# Patient Record
Sex: Female | Born: 1935 | Race: Black or African American | Hispanic: No | Smoking: Never smoker
Health system: Southern US, Community
[De-identification: ages and names within clinical notes are randomized; demographics above are authoritative.]

## PROBLEM LIST (undated history)

## (undated) DIAGNOSIS — IMO0001 Reserved for inherently not codable concepts without codable children: Secondary | ICD-10-CM

## (undated) DIAGNOSIS — H9190 Unspecified hearing loss, unspecified ear: Secondary | ICD-10-CM

## (undated) DIAGNOSIS — M1990 Unspecified osteoarthritis, unspecified site: Secondary | ICD-10-CM

## (undated) DIAGNOSIS — H269 Unspecified cataract: Secondary | ICD-10-CM

## (undated) DIAGNOSIS — N189 Chronic kidney disease, unspecified: Secondary | ICD-10-CM

## (undated) DIAGNOSIS — F419 Anxiety disorder, unspecified: Secondary | ICD-10-CM

## (undated) DIAGNOSIS — E079 Disorder of thyroid, unspecified: Secondary | ICD-10-CM

## (undated) DIAGNOSIS — D631 Anemia in chronic kidney disease: Secondary | ICD-10-CM

## (undated) DIAGNOSIS — I10 Essential (primary) hypertension: Secondary | ICD-10-CM

## (undated) DIAGNOSIS — F32A Depression, unspecified: Secondary | ICD-10-CM

## (undated) DIAGNOSIS — F329 Major depressive disorder, single episode, unspecified: Secondary | ICD-10-CM

## (undated) DIAGNOSIS — R0602 Shortness of breath: Secondary | ICD-10-CM

## (undated) DIAGNOSIS — I471 Supraventricular tachycardia, unspecified: Secondary | ICD-10-CM

## (undated) DIAGNOSIS — J189 Pneumonia, unspecified organism: Secondary | ICD-10-CM

## (undated) DIAGNOSIS — C801 Malignant (primary) neoplasm, unspecified: Secondary | ICD-10-CM

## (undated) DIAGNOSIS — K219 Gastro-esophageal reflux disease without esophagitis: Secondary | ICD-10-CM

## (undated) DIAGNOSIS — R55 Syncope and collapse: Secondary | ICD-10-CM

## (undated) DIAGNOSIS — R0600 Dyspnea, unspecified: Secondary | ICD-10-CM

## (undated) DIAGNOSIS — D649 Anemia, unspecified: Secondary | ICD-10-CM

## (undated) DIAGNOSIS — I509 Heart failure, unspecified: Secondary | ICD-10-CM

## (undated) DIAGNOSIS — I639 Cerebral infarction, unspecified: Secondary | ICD-10-CM

## (undated) DIAGNOSIS — K589 Irritable bowel syndrome without diarrhea: Secondary | ICD-10-CM

## (undated) DIAGNOSIS — E119 Type 2 diabetes mellitus without complications: Secondary | ICD-10-CM

## (undated) DIAGNOSIS — E039 Hypothyroidism, unspecified: Secondary | ICD-10-CM

## (undated) DIAGNOSIS — N3281 Overactive bladder: Secondary | ICD-10-CM

## (undated) DIAGNOSIS — E785 Hyperlipidemia, unspecified: Secondary | ICD-10-CM

## (undated) DIAGNOSIS — N186 End stage renal disease: Secondary | ICD-10-CM

## (undated) DIAGNOSIS — K9161 Intraoperative hemorrhage and hematoma of a digestive system organ or structure complicating a digestive sytem procedure: Secondary | ICD-10-CM

## (undated) HISTORY — DX: Gastro-esophageal reflux disease without esophagitis: K21.9

## (undated) HISTORY — DX: Anemia in chronic kidney disease: D63.1

## (undated) HISTORY — DX: Cerebral infarction, unspecified: I63.9

## (undated) HISTORY — DX: Type 2 diabetes mellitus without complications: E11.9

## (undated) HISTORY — DX: Depression, unspecified: F32.A

## (undated) HISTORY — DX: Major depressive disorder, single episode, unspecified: F32.9

## (undated) HISTORY — DX: Disorder of thyroid, unspecified: E07.9

## (undated) HISTORY — DX: Overactive bladder: N32.81

## (undated) HISTORY — DX: Shortness of breath: R06.02

## (undated) HISTORY — DX: Unspecified cataract: H26.9

## (undated) HISTORY — DX: Supraventricular tachycardia: I47.1

## (undated) HISTORY — DX: Supraventricular tachycardia, unspecified: I47.10

## (undated) HISTORY — DX: Hyperlipidemia, unspecified: E78.5

## (undated) HISTORY — DX: Anxiety disorder, unspecified: F41.9

## (undated) HISTORY — DX: Chronic kidney disease, unspecified: N18.9

## (undated) HISTORY — DX: Heart failure, unspecified: I50.9

## (undated) HISTORY — PX: EYE SURGERY: SHX253

## (undated) HISTORY — PX: COLONOSCOPY: SHX174

## (undated) HISTORY — DX: Unspecified osteoarthritis, unspecified site: M19.90

## (undated) HISTORY — DX: Essential (primary) hypertension: I10

## (undated) HISTORY — PX: ABDOMINAL HYSTERECTOMY: SHX81

## (undated) HISTORY — PX: VESICOVAGINAL FISTULA CLOSURE W/ TAH: SUR271

## (undated) HISTORY — DX: Irritable bowel syndrome, unspecified: K58.9

---

## 2000-06-03 ENCOUNTER — Ambulatory Visit (HOSPITAL_COMMUNITY): Admission: RE | Admit: 2000-06-03 | Discharge: 2000-06-03 | Payer: Self-pay | Admitting: Family Medicine

## 2000-06-03 ENCOUNTER — Encounter: Payer: Self-pay | Admitting: Family Medicine

## 2000-11-03 ENCOUNTER — Ambulatory Visit (HOSPITAL_COMMUNITY): Admission: RE | Admit: 2000-11-03 | Discharge: 2000-11-03 | Payer: Self-pay | Admitting: Family Medicine

## 2000-11-03 ENCOUNTER — Encounter: Payer: Self-pay | Admitting: Family Medicine

## 2001-09-26 ENCOUNTER — Encounter: Payer: Self-pay | Admitting: Family Medicine

## 2001-09-26 ENCOUNTER — Inpatient Hospital Stay (HOSPITAL_COMMUNITY): Admission: AD | Admit: 2001-09-26 | Discharge: 2001-09-28 | Payer: Self-pay | Admitting: Family Medicine

## 2001-10-18 ENCOUNTER — Ambulatory Visit (HOSPITAL_COMMUNITY): Admission: RE | Admit: 2001-10-18 | Discharge: 2001-10-18 | Payer: Self-pay | Admitting: Family Medicine

## 2001-11-07 ENCOUNTER — Ambulatory Visit (HOSPITAL_COMMUNITY): Admission: RE | Admit: 2001-11-07 | Discharge: 2001-11-07 | Payer: Self-pay | Admitting: Pulmonary Disease

## 2002-02-21 ENCOUNTER — Encounter: Payer: Self-pay | Admitting: Family Medicine

## 2002-02-21 ENCOUNTER — Ambulatory Visit (HOSPITAL_COMMUNITY): Admission: RE | Admit: 2002-02-21 | Discharge: 2002-02-21 | Payer: Self-pay | Admitting: Family Medicine

## 2002-05-17 ENCOUNTER — Emergency Department (HOSPITAL_COMMUNITY): Admission: EM | Admit: 2002-05-17 | Discharge: 2002-05-17 | Payer: Self-pay | Admitting: Internal Medicine

## 2003-02-27 ENCOUNTER — Ambulatory Visit (HOSPITAL_COMMUNITY): Admission: RE | Admit: 2003-02-27 | Discharge: 2003-02-27 | Payer: Self-pay | Admitting: Family Medicine

## 2004-02-13 ENCOUNTER — Ambulatory Visit (HOSPITAL_COMMUNITY): Admission: RE | Admit: 2004-02-13 | Discharge: 2004-02-13 | Payer: Self-pay | Admitting: Nephrology

## 2004-08-07 ENCOUNTER — Inpatient Hospital Stay (HOSPITAL_COMMUNITY): Admission: AD | Admit: 2004-08-07 | Discharge: 2004-08-08 | Payer: Self-pay | Admitting: Internal Medicine

## 2004-08-25 ENCOUNTER — Ambulatory Visit: Payer: Self-pay | Admitting: *Deleted

## 2004-08-27 ENCOUNTER — Ambulatory Visit: Payer: Self-pay | Admitting: *Deleted

## 2004-08-27 ENCOUNTER — Ambulatory Visit (HOSPITAL_COMMUNITY): Admission: RE | Admit: 2004-08-27 | Discharge: 2004-08-27 | Payer: Self-pay | Admitting: *Deleted

## 2004-08-31 ENCOUNTER — Ambulatory Visit: Payer: Self-pay | Admitting: *Deleted

## 2004-09-09 ENCOUNTER — Ambulatory Visit: Payer: Self-pay | Admitting: *Deleted

## 2004-09-09 ENCOUNTER — Ambulatory Visit (HOSPITAL_COMMUNITY): Admission: RE | Admit: 2004-09-09 | Discharge: 2004-09-09 | Payer: Self-pay | Admitting: *Deleted

## 2004-09-16 ENCOUNTER — Ambulatory Visit: Payer: Self-pay | Admitting: *Deleted

## 2004-09-30 ENCOUNTER — Ambulatory Visit: Payer: Self-pay | Admitting: *Deleted

## 2005-04-01 ENCOUNTER — Ambulatory Visit (HOSPITAL_COMMUNITY): Admission: RE | Admit: 2005-04-01 | Discharge: 2005-04-01 | Payer: Self-pay | Admitting: Family Medicine

## 2005-05-18 ENCOUNTER — Ambulatory Visit: Payer: Self-pay | Admitting: Internal Medicine

## 2005-06-01 ENCOUNTER — Ambulatory Visit (HOSPITAL_COMMUNITY): Admission: RE | Admit: 2005-06-01 | Discharge: 2005-06-01 | Payer: Self-pay | Admitting: Internal Medicine

## 2005-06-01 ENCOUNTER — Ambulatory Visit: Payer: Self-pay | Admitting: Internal Medicine

## 2005-06-01 ENCOUNTER — Encounter (INDEPENDENT_AMBULATORY_CARE_PROVIDER_SITE_OTHER): Payer: Self-pay | Admitting: *Deleted

## 2005-08-11 ENCOUNTER — Ambulatory Visit: Payer: Self-pay | Admitting: Internal Medicine

## 2005-12-20 ENCOUNTER — Ambulatory Visit (HOSPITAL_COMMUNITY): Admission: RE | Admit: 2005-12-20 | Discharge: 2005-12-20 | Payer: Self-pay | Admitting: Internal Medicine

## 2006-03-29 ENCOUNTER — Ambulatory Visit: Payer: Self-pay | Admitting: Cardiology

## 2006-03-29 ENCOUNTER — Ambulatory Visit (HOSPITAL_COMMUNITY): Admission: RE | Admit: 2006-03-29 | Discharge: 2006-03-29 | Payer: Self-pay | Admitting: Internal Medicine

## 2006-06-25 ENCOUNTER — Emergency Department (HOSPITAL_COMMUNITY): Admission: EM | Admit: 2006-06-25 | Discharge: 2006-06-25 | Payer: Self-pay | Admitting: Emergency Medicine

## 2006-08-03 ENCOUNTER — Ambulatory Visit: Payer: Self-pay | Admitting: Orthopedic Surgery

## 2006-08-04 ENCOUNTER — Ambulatory Visit (HOSPITAL_COMMUNITY): Admission: RE | Admit: 2006-08-04 | Discharge: 2006-08-04 | Payer: Self-pay | Admitting: Orthopedic Surgery

## 2006-08-11 ENCOUNTER — Ambulatory Visit: Payer: Self-pay | Admitting: Orthopedic Surgery

## 2006-09-05 ENCOUNTER — Telehealth (INDEPENDENT_AMBULATORY_CARE_PROVIDER_SITE_OTHER): Payer: Self-pay | Admitting: Internal Medicine

## 2006-09-05 ENCOUNTER — Ambulatory Visit: Payer: Self-pay | Admitting: Internal Medicine

## 2006-09-05 DIAGNOSIS — H269 Unspecified cataract: Secondary | ICD-10-CM

## 2006-09-05 DIAGNOSIS — F419 Anxiety disorder, unspecified: Secondary | ICD-10-CM

## 2006-09-05 DIAGNOSIS — M129 Arthropathy, unspecified: Secondary | ICD-10-CM | POA: Insufficient documentation

## 2006-09-05 DIAGNOSIS — K589 Irritable bowel syndrome without diarrhea: Secondary | ICD-10-CM | POA: Insufficient documentation

## 2006-09-05 DIAGNOSIS — R809 Proteinuria, unspecified: Secondary | ICD-10-CM | POA: Insufficient documentation

## 2006-09-07 ENCOUNTER — Encounter (INDEPENDENT_AMBULATORY_CARE_PROVIDER_SITE_OTHER): Payer: Self-pay | Admitting: Internal Medicine

## 2006-09-14 ENCOUNTER — Ambulatory Visit (HOSPITAL_COMMUNITY): Admission: RE | Admit: 2006-09-14 | Discharge: 2006-09-14 | Payer: Self-pay | Admitting: Internal Medicine

## 2006-09-28 ENCOUNTER — Encounter (INDEPENDENT_AMBULATORY_CARE_PROVIDER_SITE_OTHER): Payer: Self-pay | Admitting: Internal Medicine

## 2006-10-04 ENCOUNTER — Ambulatory Visit: Payer: Self-pay | Admitting: Internal Medicine

## 2006-10-05 ENCOUNTER — Encounter (INDEPENDENT_AMBULATORY_CARE_PROVIDER_SITE_OTHER): Payer: Self-pay | Admitting: Internal Medicine

## 2006-10-05 LAB — CONVERTED CEMR LAB
Albumin: 3.3 g/dL — ABNORMAL LOW (ref 3.5–5.2)
Alkaline Phosphatase: 115 units/L (ref 39–117)
CO2: 26 meq/L (ref 19–32)
Cholesterol: 205 mg/dL — ABNORMAL HIGH (ref 0–200)
Glucose, Bld: 43 mg/dL — ABNORMAL LOW (ref 70–99)
LDL Cholesterol: 132 mg/dL — ABNORMAL HIGH (ref 0–99)
Lymphocytes Relative: 32 % (ref 12–46)
Lymphs Abs: 2.2 10*3/uL (ref 0.7–3.3)
Neutrophils Relative %: 61 % (ref 43–77)
Platelets: 205 10*3/uL (ref 150–400)
Potassium: 4.4 meq/L (ref 3.5–5.3)
Sodium: 145 meq/L (ref 135–145)
Total Protein: 6.4 g/dL (ref 6.0–8.3)
Triglycerides: 64 mg/dL (ref <150)
WBC: 6.8 10*3/uL (ref 4.0–10.5)

## 2006-10-06 ENCOUNTER — Telehealth (INDEPENDENT_AMBULATORY_CARE_PROVIDER_SITE_OTHER): Payer: Self-pay | Admitting: *Deleted

## 2006-10-06 LAB — CONVERTED CEMR LAB
Saturation Ratios: 21 % (ref 20–55)
TIBC: 293 ug/dL (ref 250–470)

## 2006-10-10 ENCOUNTER — Ambulatory Visit: Payer: Self-pay | Admitting: Internal Medicine

## 2006-10-10 DIAGNOSIS — D631 Anemia in chronic kidney disease: Secondary | ICD-10-CM

## 2006-10-10 DIAGNOSIS — N189 Chronic kidney disease, unspecified: Secondary | ICD-10-CM

## 2006-10-10 DIAGNOSIS — E785 Hyperlipidemia, unspecified: Secondary | ICD-10-CM | POA: Insufficient documentation

## 2006-10-20 ENCOUNTER — Ambulatory Visit: Payer: Self-pay | Admitting: Internal Medicine

## 2006-10-20 LAB — CONVERTED CEMR LAB: Hemoglobin: 11.3 g/dL

## 2006-11-17 ENCOUNTER — Ambulatory Visit: Payer: Self-pay | Admitting: Internal Medicine

## 2006-11-17 DIAGNOSIS — R609 Edema, unspecified: Secondary | ICD-10-CM

## 2006-12-14 ENCOUNTER — Ambulatory Visit: Payer: Self-pay | Admitting: Internal Medicine

## 2006-12-14 LAB — CONVERTED CEMR LAB: Hemoglobin: 9.8 g/dL

## 2006-12-15 ENCOUNTER — Encounter (INDEPENDENT_AMBULATORY_CARE_PROVIDER_SITE_OTHER): Payer: Self-pay | Admitting: Internal Medicine

## 2006-12-16 ENCOUNTER — Telehealth (INDEPENDENT_AMBULATORY_CARE_PROVIDER_SITE_OTHER): Payer: Self-pay | Admitting: *Deleted

## 2006-12-16 LAB — CONVERTED CEMR LAB
CO2: 24 meq/L (ref 19–32)
Chloride: 108 meq/L (ref 96–112)
Creatinine, Ser: 1.63 mg/dL — ABNORMAL HIGH (ref 0.40–1.20)
Potassium: 4.1 meq/L (ref 3.5–5.3)

## 2006-12-26 ENCOUNTER — Telehealth (INDEPENDENT_AMBULATORY_CARE_PROVIDER_SITE_OTHER): Payer: Self-pay | Admitting: *Deleted

## 2006-12-27 ENCOUNTER — Telehealth (INDEPENDENT_AMBULATORY_CARE_PROVIDER_SITE_OTHER): Payer: Self-pay | Admitting: Internal Medicine

## 2006-12-30 ENCOUNTER — Telehealth (INDEPENDENT_AMBULATORY_CARE_PROVIDER_SITE_OTHER): Payer: Self-pay | Admitting: Internal Medicine

## 2007-01-11 ENCOUNTER — Ambulatory Visit: Payer: Self-pay | Admitting: Internal Medicine

## 2007-01-11 LAB — CONVERTED CEMR LAB: Hemoglobin: 8.6 g/dL

## 2007-01-24 ENCOUNTER — Emergency Department (HOSPITAL_COMMUNITY): Admission: EM | Admit: 2007-01-24 | Discharge: 2007-01-24 | Payer: Self-pay | Admitting: Emergency Medicine

## 2007-01-27 ENCOUNTER — Ambulatory Visit: Payer: Self-pay | Admitting: Internal Medicine

## 2007-01-28 ENCOUNTER — Encounter (INDEPENDENT_AMBULATORY_CARE_PROVIDER_SITE_OTHER): Payer: Self-pay | Admitting: Internal Medicine

## 2007-01-30 ENCOUNTER — Telehealth (INDEPENDENT_AMBULATORY_CARE_PROVIDER_SITE_OTHER): Payer: Self-pay | Admitting: *Deleted

## 2007-01-30 LAB — CONVERTED CEMR LAB
CO2: 25 meq/L (ref 19–32)
Chloride: 105 meq/L (ref 96–112)
Creatinine, Ser: 1.85 mg/dL — ABNORMAL HIGH (ref 0.40–1.20)
Potassium: 4.7 meq/L (ref 3.5–5.3)
Sodium: 144 meq/L (ref 135–145)

## 2007-02-01 ENCOUNTER — Ambulatory Visit: Payer: Self-pay | Admitting: Internal Medicine

## 2007-02-22 ENCOUNTER — Ambulatory Visit: Payer: Self-pay | Admitting: Internal Medicine

## 2007-02-22 LAB — CONVERTED CEMR LAB: Hgb A1c MFr Bld: 6.6 %

## 2007-03-22 ENCOUNTER — Ambulatory Visit: Payer: Self-pay | Admitting: Internal Medicine

## 2007-03-22 LAB — CONVERTED CEMR LAB: Hemoglobin: 12.6 g/dL

## 2007-03-24 ENCOUNTER — Encounter (INDEPENDENT_AMBULATORY_CARE_PROVIDER_SITE_OTHER): Payer: Self-pay | Admitting: Internal Medicine

## 2007-04-05 ENCOUNTER — Ambulatory Visit: Payer: Self-pay | Admitting: Internal Medicine

## 2007-04-05 LAB — CONVERTED CEMR LAB: Hemoglobin: 11 g/dL

## 2007-04-06 ENCOUNTER — Emergency Department (HOSPITAL_COMMUNITY): Admission: EM | Admit: 2007-04-06 | Discharge: 2007-04-06 | Payer: Self-pay | Admitting: Emergency Medicine

## 2007-04-10 ENCOUNTER — Encounter (INDEPENDENT_AMBULATORY_CARE_PROVIDER_SITE_OTHER): Payer: Self-pay | Admitting: Internal Medicine

## 2007-05-03 ENCOUNTER — Ambulatory Visit (HOSPITAL_COMMUNITY): Admission: RE | Admit: 2007-05-03 | Discharge: 2007-05-03 | Payer: Self-pay | Admitting: Internal Medicine

## 2007-05-03 ENCOUNTER — Ambulatory Visit: Payer: Self-pay | Admitting: Internal Medicine

## 2007-05-03 ENCOUNTER — Telehealth (INDEPENDENT_AMBULATORY_CARE_PROVIDER_SITE_OTHER): Payer: Self-pay | Admitting: *Deleted

## 2007-05-03 LAB — CONVERTED CEMR LAB
ALT: 12 units/L (ref 0–35)
Albumin: 3.2 g/dL — ABNORMAL LOW (ref 3.5–5.2)
Alkaline Phosphatase: 107 units/L (ref 39–117)
Basophils Absolute: 0.1 10*3/uL (ref 0.0–0.1)
CO2: 32 meq/L (ref 19–32)
Glucose, Bld: 249 mg/dL — ABNORMAL HIGH (ref 70–99)
Hemoglobin: 12.2 g/dL
Lymphocytes Relative: 25 % (ref 12–46)
Lymphs Abs: 2 10*3/uL (ref 0.7–4.0)
Neutro Abs: 5.4 10*3/uL (ref 1.7–7.7)
Neutrophils Relative %: 67 % (ref 43–77)
Platelets: 239 10*3/uL (ref 150–400)
Potassium: 3.7 meq/L (ref 3.5–5.3)
RDW: 16.3 % — ABNORMAL HIGH (ref 11.5–15.5)
Sodium: 141 meq/L (ref 135–145)
TSH: 1.361 microintl units/mL (ref 0.350–5.50)
Total Protein: 7.2 g/dL (ref 6.0–8.3)
WBC: 8.1 10*3/uL (ref 4.0–10.5)

## 2007-05-11 ENCOUNTER — Encounter (INDEPENDENT_AMBULATORY_CARE_PROVIDER_SITE_OTHER): Payer: Self-pay | Admitting: Internal Medicine

## 2007-05-17 ENCOUNTER — Ambulatory Visit (HOSPITAL_COMMUNITY): Admission: RE | Admit: 2007-05-17 | Discharge: 2007-05-17 | Payer: Self-pay | Admitting: Internal Medicine

## 2007-05-18 ENCOUNTER — Ambulatory Visit: Payer: Self-pay | Admitting: Cardiology

## 2007-05-25 ENCOUNTER — Encounter (INDEPENDENT_AMBULATORY_CARE_PROVIDER_SITE_OTHER): Payer: Self-pay | Admitting: Internal Medicine

## 2007-05-31 ENCOUNTER — Ambulatory Visit: Payer: Self-pay | Admitting: Internal Medicine

## 2007-05-31 DIAGNOSIS — R32 Unspecified urinary incontinence: Secondary | ICD-10-CM

## 2007-05-31 LAB — CONVERTED CEMR LAB: Hemoglobin: 11 g/dL

## 2007-06-05 ENCOUNTER — Telehealth (INDEPENDENT_AMBULATORY_CARE_PROVIDER_SITE_OTHER): Payer: Self-pay | Admitting: Internal Medicine

## 2007-06-06 ENCOUNTER — Encounter (INDEPENDENT_AMBULATORY_CARE_PROVIDER_SITE_OTHER): Payer: Self-pay | Admitting: Internal Medicine

## 2007-06-10 ENCOUNTER — Inpatient Hospital Stay (HOSPITAL_COMMUNITY): Admission: EM | Admit: 2007-06-10 | Discharge: 2007-06-15 | Payer: Self-pay | Admitting: Emergency Medicine

## 2007-06-11 ENCOUNTER — Encounter (INDEPENDENT_AMBULATORY_CARE_PROVIDER_SITE_OTHER): Payer: Self-pay | Admitting: Internal Medicine

## 2007-06-13 ENCOUNTER — Ambulatory Visit: Payer: Self-pay | Admitting: Oncology

## 2007-06-15 ENCOUNTER — Ambulatory Visit: Payer: Self-pay | Admitting: Internal Medicine

## 2007-06-15 ENCOUNTER — Inpatient Hospital Stay: Admission: AD | Admit: 2007-06-15 | Discharge: 2007-08-04 | Payer: Self-pay | Admitting: Internal Medicine

## 2007-06-21 ENCOUNTER — Encounter (INDEPENDENT_AMBULATORY_CARE_PROVIDER_SITE_OTHER): Payer: Self-pay | Admitting: Internal Medicine

## 2007-06-30 ENCOUNTER — Encounter (INDEPENDENT_AMBULATORY_CARE_PROVIDER_SITE_OTHER): Payer: Self-pay | Admitting: Internal Medicine

## 2007-07-18 ENCOUNTER — Encounter (INDEPENDENT_AMBULATORY_CARE_PROVIDER_SITE_OTHER): Payer: Self-pay | Admitting: Internal Medicine

## 2007-08-04 ENCOUNTER — Encounter (INDEPENDENT_AMBULATORY_CARE_PROVIDER_SITE_OTHER): Payer: Self-pay | Admitting: Internal Medicine

## 2007-08-07 ENCOUNTER — Ambulatory Visit: Payer: Self-pay | Admitting: Internal Medicine

## 2007-08-07 DIAGNOSIS — I1311 Hypertensive heart and chronic kidney disease without heart failure, with stage 5 chronic kidney disease, or end stage renal disease: Secondary | ICD-10-CM

## 2007-08-07 DIAGNOSIS — N185 Chronic kidney disease, stage 5: Secondary | ICD-10-CM

## 2007-08-08 ENCOUNTER — Telehealth (INDEPENDENT_AMBULATORY_CARE_PROVIDER_SITE_OTHER): Payer: Self-pay | Admitting: Internal Medicine

## 2007-08-10 ENCOUNTER — Encounter (INDEPENDENT_AMBULATORY_CARE_PROVIDER_SITE_OTHER): Payer: Self-pay | Admitting: Internal Medicine

## 2007-08-14 ENCOUNTER — Encounter (INDEPENDENT_AMBULATORY_CARE_PROVIDER_SITE_OTHER): Payer: Self-pay | Admitting: Internal Medicine

## 2007-08-18 ENCOUNTER — Encounter (INDEPENDENT_AMBULATORY_CARE_PROVIDER_SITE_OTHER): Payer: Self-pay | Admitting: Internal Medicine

## 2007-08-30 ENCOUNTER — Encounter (INDEPENDENT_AMBULATORY_CARE_PROVIDER_SITE_OTHER): Payer: Self-pay | Admitting: Internal Medicine

## 2007-09-05 ENCOUNTER — Encounter (INDEPENDENT_AMBULATORY_CARE_PROVIDER_SITE_OTHER): Payer: Self-pay | Admitting: Internal Medicine

## 2007-09-05 ENCOUNTER — Ambulatory Visit: Payer: Self-pay | Admitting: Family Medicine

## 2007-09-05 LAB — CONVERTED CEMR LAB: Hgb A1c MFr Bld: 6.9 %

## 2007-09-06 ENCOUNTER — Encounter (INDEPENDENT_AMBULATORY_CARE_PROVIDER_SITE_OTHER): Payer: Self-pay | Admitting: Internal Medicine

## 2007-09-06 LAB — CONVERTED CEMR LAB
ALT: 11 units/L (ref 0–35)
AST: 14 units/L (ref 0–37)
Albumin: 3.4 g/dL — ABNORMAL LOW (ref 3.5–5.2)
CO2: 25 meq/L (ref 19–32)
Calcium: 9.2 mg/dL (ref 8.4–10.5)
Chloride: 106 meq/L (ref 96–112)
Cholesterol: 166 mg/dL (ref 0–200)
Creatinine, Ser: 1.85 mg/dL — ABNORMAL HIGH (ref 0.40–1.20)
Potassium: 4.2 meq/L (ref 3.5–5.3)
Total CHOL/HDL Ratio: 3.7

## 2007-09-08 ENCOUNTER — Encounter (INDEPENDENT_AMBULATORY_CARE_PROVIDER_SITE_OTHER): Payer: Self-pay | Admitting: Internal Medicine

## 2007-09-21 ENCOUNTER — Telehealth (INDEPENDENT_AMBULATORY_CARE_PROVIDER_SITE_OTHER): Payer: Self-pay | Admitting: Internal Medicine

## 2007-09-27 ENCOUNTER — Telehealth (INDEPENDENT_AMBULATORY_CARE_PROVIDER_SITE_OTHER): Payer: Self-pay | Admitting: Internal Medicine

## 2007-10-03 ENCOUNTER — Ambulatory Visit: Payer: Self-pay | Admitting: Internal Medicine

## 2007-10-03 LAB — CONVERTED CEMR LAB: Hemoglobin: 11.5 g/dL

## 2007-10-05 ENCOUNTER — Observation Stay (HOSPITAL_COMMUNITY): Admission: EM | Admit: 2007-10-05 | Discharge: 2007-10-06 | Payer: Self-pay | Admitting: Emergency Medicine

## 2007-10-11 ENCOUNTER — Encounter (INDEPENDENT_AMBULATORY_CARE_PROVIDER_SITE_OTHER): Payer: Self-pay | Admitting: Internal Medicine

## 2007-10-12 ENCOUNTER — Encounter (INDEPENDENT_AMBULATORY_CARE_PROVIDER_SITE_OTHER): Payer: Self-pay | Admitting: Internal Medicine

## 2007-10-17 ENCOUNTER — Ambulatory Visit (HOSPITAL_COMMUNITY): Admission: RE | Admit: 2007-10-17 | Discharge: 2007-10-17 | Payer: Self-pay | Admitting: Internal Medicine

## 2007-10-24 ENCOUNTER — Telehealth (INDEPENDENT_AMBULATORY_CARE_PROVIDER_SITE_OTHER): Payer: Self-pay | Admitting: *Deleted

## 2007-10-25 ENCOUNTER — Telehealth (INDEPENDENT_AMBULATORY_CARE_PROVIDER_SITE_OTHER): Payer: Self-pay | Admitting: *Deleted

## 2007-10-30 ENCOUNTER — Ambulatory Visit (HOSPITAL_COMMUNITY): Admission: RE | Admit: 2007-10-30 | Discharge: 2007-10-30 | Payer: Self-pay

## 2007-10-31 ENCOUNTER — Ambulatory Visit: Payer: Self-pay | Admitting: Internal Medicine

## 2007-10-31 LAB — CONVERTED CEMR LAB: Hemoglobin: 9.6 g/dL

## 2007-11-01 ENCOUNTER — Encounter (INDEPENDENT_AMBULATORY_CARE_PROVIDER_SITE_OTHER): Payer: Self-pay | Admitting: Internal Medicine

## 2007-11-01 LAB — CONVERTED CEMR LAB
Basophils Absolute: 0 10*3/uL (ref 0.0–0.1)
Basophils Relative: 1 % (ref 0–1)
CO2: 25 meq/L (ref 19–32)
Chloride: 107 meq/L (ref 96–112)
Creatinine, Ser: 2.15 mg/dL — ABNORMAL HIGH (ref 0.40–1.20)
Ferritin: 50 ng/mL (ref 10–291)
Hemoglobin: 10.2 g/dL — ABNORMAL LOW (ref 12.0–15.0)
Iron: 83 ug/dL (ref 42–145)
Lymphocytes Relative: 27 % (ref 12–46)
MCHC: 30.9 g/dL (ref 30.0–36.0)
Monocytes Absolute: 0.4 10*3/uL (ref 0.1–1.0)
Neutro Abs: 3.4 10*3/uL (ref 1.7–7.7)
Neutrophils Relative %: 65 % (ref 43–77)
Phosphorus: 3.7 mg/dL (ref 2.3–4.6)
Platelets: 197 10*3/uL (ref 150–400)
Potassium: 4.8 meq/L (ref 3.5–5.3)
RDW: 16.2 % — ABNORMAL HIGH (ref 11.5–15.5)
Saturation Ratios: 30 % (ref 20–55)
Sodium: 141 meq/L (ref 135–145)
TIBC: 278 ug/dL (ref 250–470)
UIBC: 195 ug/dL

## 2007-11-02 ENCOUNTER — Encounter (INDEPENDENT_AMBULATORY_CARE_PROVIDER_SITE_OTHER): Payer: Self-pay | Admitting: Internal Medicine

## 2007-11-03 ENCOUNTER — Encounter (INDEPENDENT_AMBULATORY_CARE_PROVIDER_SITE_OTHER): Payer: Self-pay | Admitting: Internal Medicine

## 2007-11-30 ENCOUNTER — Telehealth (INDEPENDENT_AMBULATORY_CARE_PROVIDER_SITE_OTHER): Payer: Self-pay | Admitting: *Deleted

## 2007-12-01 ENCOUNTER — Ambulatory Visit: Payer: Self-pay | Admitting: Internal Medicine

## 2007-12-01 LAB — CONVERTED CEMR LAB: Hemoglobin: 11.3 g/dL

## 2007-12-05 ENCOUNTER — Encounter (INDEPENDENT_AMBULATORY_CARE_PROVIDER_SITE_OTHER): Payer: Self-pay | Admitting: Internal Medicine

## 2007-12-11 ENCOUNTER — Encounter (INDEPENDENT_AMBULATORY_CARE_PROVIDER_SITE_OTHER): Payer: Self-pay | Admitting: Internal Medicine

## 2007-12-18 ENCOUNTER — Ambulatory Visit: Payer: Self-pay | Admitting: Internal Medicine

## 2008-01-02 ENCOUNTER — Ambulatory Visit: Payer: Self-pay | Admitting: Family Medicine

## 2008-01-02 ENCOUNTER — Encounter (INDEPENDENT_AMBULATORY_CARE_PROVIDER_SITE_OTHER): Payer: Self-pay | Admitting: Internal Medicine

## 2008-01-02 LAB — CONVERTED CEMR LAB
Hemoglobin: 10.1 g/dL
Hgb A1c MFr Bld: 6.3 %

## 2008-01-29 ENCOUNTER — Encounter (INDEPENDENT_AMBULATORY_CARE_PROVIDER_SITE_OTHER): Payer: Self-pay | Admitting: Internal Medicine

## 2008-01-31 ENCOUNTER — Ambulatory Visit: Payer: Self-pay | Admitting: Internal Medicine

## 2008-02-06 ENCOUNTER — Telehealth (INDEPENDENT_AMBULATORY_CARE_PROVIDER_SITE_OTHER): Payer: Self-pay | Admitting: *Deleted

## 2008-02-07 ENCOUNTER — Telehealth (INDEPENDENT_AMBULATORY_CARE_PROVIDER_SITE_OTHER): Payer: Self-pay | Admitting: Internal Medicine

## 2008-02-15 ENCOUNTER — Ambulatory Visit (HOSPITAL_COMMUNITY): Admission: RE | Admit: 2008-02-15 | Discharge: 2008-02-15 | Payer: Self-pay | Admitting: Internal Medicine

## 2008-02-26 ENCOUNTER — Encounter (INDEPENDENT_AMBULATORY_CARE_PROVIDER_SITE_OTHER): Payer: Self-pay | Admitting: Internal Medicine

## 2008-03-06 ENCOUNTER — Ambulatory Visit: Payer: Self-pay | Admitting: Internal Medicine

## 2008-03-07 ENCOUNTER — Encounter (INDEPENDENT_AMBULATORY_CARE_PROVIDER_SITE_OTHER): Payer: Self-pay | Admitting: Internal Medicine

## 2008-03-07 LAB — CONVERTED CEMR LAB
Albumin: 3.3 g/dL — ABNORMAL LOW (ref 3.5–5.2)
BUN: 41 mg/dL — ABNORMAL HIGH (ref 6–23)
Basophils Relative: 1 % (ref 0–1)
Calcium: 9.1 mg/dL (ref 8.4–10.5)
Eosinophils Absolute: 0 10*3/uL (ref 0.0–0.7)
Eosinophils Relative: 0 % (ref 0–5)
Glucose, Bld: 202 mg/dL — ABNORMAL HIGH (ref 70–99)
HCT: 33.2 % — ABNORMAL LOW (ref 36.0–46.0)
Lymphs Abs: 1.6 10*3/uL (ref 0.7–4.0)
MCHC: 32.2 g/dL (ref 30.0–36.0)
MCV: 70.9 fL — ABNORMAL LOW (ref 78.0–100.0)
Monocytes Relative: 7 % (ref 3–12)
Phosphorus: 3.8 mg/dL (ref 2.3–4.6)
Platelets: 176 10*3/uL (ref 150–400)
WBC: 6.5 10*3/uL (ref 4.0–10.5)

## 2008-03-19 ENCOUNTER — Encounter (INDEPENDENT_AMBULATORY_CARE_PROVIDER_SITE_OTHER): Payer: Self-pay | Admitting: Internal Medicine

## 2008-03-20 ENCOUNTER — Encounter (INDEPENDENT_AMBULATORY_CARE_PROVIDER_SITE_OTHER): Payer: Self-pay | Admitting: Family Medicine

## 2008-03-21 ENCOUNTER — Ambulatory Visit: Payer: Self-pay | Admitting: Internal Medicine

## 2008-04-01 ENCOUNTER — Encounter (INDEPENDENT_AMBULATORY_CARE_PROVIDER_SITE_OTHER): Payer: Self-pay | Admitting: Internal Medicine

## 2008-04-02 ENCOUNTER — Encounter (INDEPENDENT_AMBULATORY_CARE_PROVIDER_SITE_OTHER): Payer: Self-pay | Admitting: Internal Medicine

## 2008-04-03 ENCOUNTER — Ambulatory Visit: Payer: Self-pay | Admitting: Internal Medicine

## 2008-04-03 DIAGNOSIS — J309 Allergic rhinitis, unspecified: Secondary | ICD-10-CM | POA: Insufficient documentation

## 2008-04-10 ENCOUNTER — Encounter (INDEPENDENT_AMBULATORY_CARE_PROVIDER_SITE_OTHER): Payer: Self-pay | Admitting: Internal Medicine

## 2008-04-18 ENCOUNTER — Encounter (INDEPENDENT_AMBULATORY_CARE_PROVIDER_SITE_OTHER): Payer: Self-pay | Admitting: Internal Medicine

## 2008-04-19 ENCOUNTER — Telehealth (INDEPENDENT_AMBULATORY_CARE_PROVIDER_SITE_OTHER): Payer: Self-pay | Admitting: Internal Medicine

## 2008-04-19 ENCOUNTER — Encounter (INDEPENDENT_AMBULATORY_CARE_PROVIDER_SITE_OTHER): Payer: Self-pay | Admitting: Internal Medicine

## 2008-04-22 ENCOUNTER — Encounter (INDEPENDENT_AMBULATORY_CARE_PROVIDER_SITE_OTHER): Payer: Self-pay | Admitting: Internal Medicine

## 2008-04-24 ENCOUNTER — Ambulatory Visit: Payer: Self-pay | Admitting: Internal Medicine

## 2008-04-30 ENCOUNTER — Ambulatory Visit: Payer: Self-pay | Admitting: Internal Medicine

## 2008-04-30 DIAGNOSIS — R079 Chest pain, unspecified: Secondary | ICD-10-CM

## 2008-05-06 ENCOUNTER — Encounter (INDEPENDENT_AMBULATORY_CARE_PROVIDER_SITE_OTHER): Payer: Self-pay | Admitting: Internal Medicine

## 2008-05-06 ENCOUNTER — Emergency Department (HOSPITAL_COMMUNITY): Admission: EM | Admit: 2008-05-06 | Discharge: 2008-05-06 | Payer: Self-pay | Admitting: Emergency Medicine

## 2008-05-27 ENCOUNTER — Encounter (INDEPENDENT_AMBULATORY_CARE_PROVIDER_SITE_OTHER): Payer: Self-pay | Admitting: Internal Medicine

## 2008-05-29 ENCOUNTER — Ambulatory Visit: Payer: Self-pay | Admitting: Internal Medicine

## 2008-05-29 LAB — CONVERTED CEMR LAB: Hemoglobin: 10.2 g/dL

## 2008-06-05 ENCOUNTER — Encounter (INDEPENDENT_AMBULATORY_CARE_PROVIDER_SITE_OTHER): Payer: Self-pay | Admitting: Internal Medicine

## 2008-06-12 ENCOUNTER — Ambulatory Visit: Payer: Self-pay | Admitting: Internal Medicine

## 2008-06-26 ENCOUNTER — Encounter (INDEPENDENT_AMBULATORY_CARE_PROVIDER_SITE_OTHER): Payer: Self-pay | Admitting: Internal Medicine

## 2008-07-03 ENCOUNTER — Ambulatory Visit: Payer: Self-pay | Admitting: Internal Medicine

## 2008-07-03 DIAGNOSIS — E1129 Type 2 diabetes mellitus with other diabetic kidney complication: Secondary | ICD-10-CM

## 2008-07-03 LAB — CONVERTED CEMR LAB: Blood Glucose, Fingerstick: 180

## 2008-07-24 ENCOUNTER — Telehealth (INDEPENDENT_AMBULATORY_CARE_PROVIDER_SITE_OTHER): Payer: Self-pay | Admitting: *Deleted

## 2008-07-24 ENCOUNTER — Encounter (INDEPENDENT_AMBULATORY_CARE_PROVIDER_SITE_OTHER): Payer: Self-pay | Admitting: Internal Medicine

## 2008-07-31 ENCOUNTER — Ambulatory Visit: Payer: Self-pay | Admitting: Internal Medicine

## 2008-08-06 ENCOUNTER — Encounter (INDEPENDENT_AMBULATORY_CARE_PROVIDER_SITE_OTHER): Payer: Self-pay | Admitting: Internal Medicine

## 2008-08-08 ENCOUNTER — Ambulatory Visit: Payer: Self-pay | Admitting: Internal Medicine

## 2008-08-20 ENCOUNTER — Encounter (INDEPENDENT_AMBULATORY_CARE_PROVIDER_SITE_OTHER): Payer: Self-pay | Admitting: Internal Medicine

## 2008-08-23 ENCOUNTER — Encounter (INDEPENDENT_AMBULATORY_CARE_PROVIDER_SITE_OTHER): Payer: Self-pay | Admitting: Internal Medicine

## 2008-08-28 ENCOUNTER — Ambulatory Visit: Payer: Self-pay | Admitting: Internal Medicine

## 2008-08-28 DIAGNOSIS — S139XXA Sprain of joints and ligaments of unspecified parts of neck, initial encounter: Secondary | ICD-10-CM | POA: Insufficient documentation

## 2008-09-11 ENCOUNTER — Encounter (INDEPENDENT_AMBULATORY_CARE_PROVIDER_SITE_OTHER): Payer: Self-pay | Admitting: Internal Medicine

## 2008-09-18 ENCOUNTER — Encounter (INDEPENDENT_AMBULATORY_CARE_PROVIDER_SITE_OTHER): Payer: Self-pay | Admitting: Internal Medicine

## 2008-09-24 ENCOUNTER — Ambulatory Visit: Payer: Self-pay | Admitting: Internal Medicine

## 2008-09-24 LAB — CONVERTED CEMR LAB: Hgb A1c MFr Bld: 6.4 %

## 2008-10-10 ENCOUNTER — Encounter (INDEPENDENT_AMBULATORY_CARE_PROVIDER_SITE_OTHER): Payer: Self-pay | Admitting: Internal Medicine

## 2008-10-11 ENCOUNTER — Encounter (INDEPENDENT_AMBULATORY_CARE_PROVIDER_SITE_OTHER): Payer: Self-pay | Admitting: Internal Medicine

## 2008-11-21 ENCOUNTER — Inpatient Hospital Stay (HOSPITAL_COMMUNITY): Admission: EM | Admit: 2008-11-21 | Discharge: 2008-11-29 | Payer: Self-pay | Admitting: Emergency Medicine

## 2008-11-22 ENCOUNTER — Encounter (INDEPENDENT_AMBULATORY_CARE_PROVIDER_SITE_OTHER): Payer: Self-pay | Admitting: Internal Medicine

## 2009-01-11 HISTORY — PX: TEE WITHOUT CARDIOVERSION: SHX5443

## 2009-01-24 ENCOUNTER — Ambulatory Visit (HOSPITAL_COMMUNITY): Payer: Self-pay | Admitting: Nephrology

## 2009-01-24 ENCOUNTER — Encounter (HOSPITAL_COMMUNITY): Admission: RE | Admit: 2009-01-24 | Discharge: 2009-02-23 | Payer: Self-pay | Admitting: Oncology

## 2009-02-28 ENCOUNTER — Encounter (HOSPITAL_COMMUNITY): Admission: RE | Admit: 2009-02-28 | Discharge: 2009-03-30 | Payer: Self-pay | Admitting: Nephrology

## 2009-02-28 ENCOUNTER — Ambulatory Visit (HOSPITAL_COMMUNITY): Admission: RE | Admit: 2009-02-28 | Discharge: 2009-02-28 | Payer: Self-pay | Admitting: Family Medicine

## 2009-03-28 ENCOUNTER — Ambulatory Visit (HOSPITAL_COMMUNITY): Payer: Self-pay | Admitting: Nephrology

## 2009-04-25 ENCOUNTER — Encounter (HOSPITAL_COMMUNITY): Admission: RE | Admit: 2009-04-25 | Discharge: 2009-05-25 | Payer: Self-pay | Admitting: Nephrology

## 2009-05-23 ENCOUNTER — Ambulatory Visit (HOSPITAL_COMMUNITY): Payer: Self-pay | Admitting: Nephrology

## 2009-06-20 ENCOUNTER — Encounter (HOSPITAL_COMMUNITY): Admission: RE | Admit: 2009-06-20 | Discharge: 2009-07-20 | Payer: Self-pay | Admitting: Nephrology

## 2009-07-18 ENCOUNTER — Ambulatory Visit (HOSPITAL_COMMUNITY): Payer: Self-pay | Admitting: Nephrology

## 2009-08-15 ENCOUNTER — Encounter (HOSPITAL_COMMUNITY): Admission: RE | Admit: 2009-08-15 | Discharge: 2009-09-14 | Payer: Self-pay | Admitting: Nephrology

## 2009-09-12 ENCOUNTER — Ambulatory Visit (HOSPITAL_COMMUNITY): Payer: Self-pay | Admitting: Nephrology

## 2009-10-17 ENCOUNTER — Encounter (HOSPITAL_COMMUNITY)
Admission: RE | Admit: 2009-10-17 | Discharge: 2009-11-16 | Payer: Self-pay | Source: Home / Self Care | Admitting: Nephrology

## 2009-11-01 ENCOUNTER — Inpatient Hospital Stay (HOSPITAL_COMMUNITY): Admission: EM | Admit: 2009-11-01 | Discharge: 2009-11-11 | Payer: Self-pay | Admitting: Emergency Medicine

## 2009-11-02 ENCOUNTER — Ambulatory Visit: Payer: Self-pay | Admitting: Internal Medicine

## 2009-11-04 ENCOUNTER — Encounter (INDEPENDENT_AMBULATORY_CARE_PROVIDER_SITE_OTHER): Payer: Self-pay | Admitting: Internal Medicine

## 2009-11-06 ENCOUNTER — Encounter (INDEPENDENT_AMBULATORY_CARE_PROVIDER_SITE_OTHER): Payer: Self-pay | Admitting: Nephrology

## 2009-11-06 ENCOUNTER — Encounter: Payer: Self-pay | Admitting: Internal Medicine

## 2009-11-23 ENCOUNTER — Inpatient Hospital Stay (HOSPITAL_COMMUNITY): Admission: EM | Admit: 2009-11-23 | Discharge: 2009-11-27 | Payer: Self-pay | Admitting: Internal Medicine

## 2009-11-23 ENCOUNTER — Encounter (INDEPENDENT_AMBULATORY_CARE_PROVIDER_SITE_OTHER): Payer: Self-pay | Admitting: Internal Medicine

## 2009-11-24 ENCOUNTER — Ambulatory Visit: Payer: Self-pay | Admitting: Internal Medicine

## 2009-11-26 ENCOUNTER — Encounter: Payer: Self-pay | Admitting: Internal Medicine

## 2009-11-29 LAB — HM COLONOSCOPY

## 2009-12-18 ENCOUNTER — Ambulatory Visit (HOSPITAL_COMMUNITY): Payer: Self-pay | Admitting: Nephrology

## 2009-12-18 ENCOUNTER — Encounter (HOSPITAL_COMMUNITY)
Admission: RE | Admit: 2009-12-18 | Discharge: 2010-01-17 | Payer: Self-pay | Source: Home / Self Care | Attending: Nephrology | Admitting: Nephrology

## 2010-01-29 ENCOUNTER — Encounter (HOSPITAL_COMMUNITY)
Admission: RE | Admit: 2010-01-29 | Discharge: 2010-02-10 | Payer: Self-pay | Source: Home / Self Care | Attending: Nephrology | Admitting: Nephrology

## 2010-02-02 ENCOUNTER — Encounter: Payer: Self-pay | Admitting: Internal Medicine

## 2010-02-02 LAB — RENAL FUNCTION PANEL
Albumin: 3.3 g/dL — ABNORMAL LOW (ref 3.5–5.2)
BUN: 89 mg/dL — ABNORMAL HIGH (ref 6–23)
CO2: 27 mEq/L (ref 19–32)
Chloride: 99 mEq/L (ref 96–112)
GFR calc non Af Amer: 12 mL/min — ABNORMAL LOW (ref >60)
Potassium: 3.5 mEq/L (ref 3.5–5.1)

## 2010-02-02 LAB — HEMOGLOBIN AND HEMATOCRIT, BLOOD: HCT: 34.8 % — ABNORMAL LOW (ref 36.0–46.0)

## 2010-02-08 LAB — CONVERTED CEMR LAB
Bilirubin Urine: NEGATIVE
Glucose, Urine, Semiquant: NEGATIVE
Protein, U semiquant: >=300
Troponin I: 0.02 ng/mL (ref <0.06)
pH: 6.5

## 2010-02-10 NOTE — Procedures (Signed)
Summary: Colonoscopy  Patient: [name] Note: All result statuses are Final unless otherwise noted.  Tests: (1) Colonoscopy (COL)   COL Colonoscopy           [facility]     [place], [place]  [id number]           COLONOSCOPY PROCEDURE REPORT           PATIENT:  Paula, Wood  MR#:  [id number]     BIRTHDATE:  07/05/1925, 84 yrs. old  GENDER:  female     ENDOSCOPIST:  Lowella Bandy. Olevia Perches, MD     REF. BY:     PROCEDURE DATE:  11/26/2009     PROCEDURE:  Colonoscopy [id number]     ASA CLASS:  Class III     INDICATIONS:  Iron deficiency anemia severe iron deficiency,     anemia, heme negative stool     MEDICATIONS:   There was residual sedation effect present from     prior procedure.           DESCRIPTION OF PROCEDURE:   After the risks benefits and     alternatives of the procedure were thoroughly explained, informed     consent was obtained.  Digital rectal exam was performed and     revealed no rectal masses.   The EC-[id number]) and EC-[id number]     [id number]) endoscope was introduced through the anus and advanced     to the cecum, which was identified by both the appendix and     ileocecal valve, without limitations.  The quality of the prep was     good, using MiraLax.  The instrument was then slowly withdrawn as     the colon was fully examined.     <<PROCEDUREIMAGES>>           FINDINGS:  No polyps or cancers were seen (see image001, image002,     image003, and image004).  Internal hemorrhoids were found (see     image004 and image005).   Retroflexed views in the rectum revealed     no abnormalities.    The scope was then withdrawn from the patient     and the procedure completed.           COMPLICATIONS:  None     ENDOSCOPIC IMPRESSION:     1) No polyps or cancers     2) Internal hemorrhoids     RECOMMENDATIONS:     see chart for plan of treatment, Iron, keep checkin H/H and stoo     hemoccults     REPEAT EXAM:  In 10  year(s) for.           ______________________________     Lowella Bandy. Olevia Perches, MD           CC:           n.     eSIGNED:   Lowella Bandy. Brodie at 11/26/2009 03:17 PM           [place] Lemon, [id number]  Note: An exclamation mark (!) indicates a result that was not dispersed into the flowsheet. Document Creation Date: 11/26/2009 3:17 PM _______________________________________________________________________  (1) Order result status: Final Collection or observation date-time: 11/26/2009 15:12 Requested date-time:  Receipt date-time:  Reported date-time:  Referring Physician:   Ordering Physician: Delfin Edis [phone]) Specimen Source:  Source: Tawanna Cooler Order Number: [id number] Lab site:  Appended Document: Colonoscopy    Clinical Lists Changes  Observations: Added new observation of COLONNXTDUE: 11/2019 (11/28/2009 8:50)      Appended Document: Colonoscopy recall in IDX 11/2019

## 2010-02-10 NOTE — Procedures (Signed)
Summary: Upper Endoscopy  Patient: [name] Note: All result statuses are Final unless otherwise noted.  Tests: (1) Upper Endoscopy (EGD)   EGD Upper Endoscopy       DONE     [facility]     [place], [place]  [id number]           ENDOSCOPY PROCEDURE REPORT           PATIENT:  Paula, Wood  MR#:  [id number]     BIRTHDATE:  April 17, 1925, 84 yrs. old  GENDER:  female           ENDOSCOPIST:  Lowella Bandy. Olevia Perches, MD     Referred by:           PROCEDURE DATE:  11/26/2009     PROCEDURE:  EGD, diagnostic 43235     ASA CLASS:  Class II     INDICATIONS:  iron deficiency anemia severe iron def, stool hem     negative           MEDICATIONS:   Versed 4 mg, Fentanyl 35 mcg     TOPICAL ANESTHETIC:  Cetacaine Spray           DESCRIPTION OF PROCEDURE:   After the risks benefits and     alternatives of the procedure were thoroughly explained, informed     consent was obtained.  The EG-[id number]) and EC-3890Li     [id number]) endoscope was introduced through the mouth and advanced     to the second portion of the duodenum, without limitations.  The     instrument was slowly withdrawn as the mucosa was fully examined.     <<PROCEDUREIMAGES>>           AVM (see image006 and image007). 6 mm avm vs abrasion, not     bleeding, no stigmata of recent bleeding  Otherwise the     examination was normal (see image008, image005, image004,     image003, image002, and image001).    Retroflexed views revealed     no abnormalities.    The scope was then withdrawn from the patient     and the procedure completed.           COMPLICATIONS:  None           ENDOSCOPIC IMPRESSION:     1) AVM     2) Otherwise normal examination     gastric avm not bleeding, no intervention since pt is heme     negative     RECOMMENDATIONS:     colonoscopy           REPEAT EXAM:  In 0 year(s) for.           ______________________________     Lowella Bandy. Olevia Perches, MD           CC:           n.     eSIGNED:   Lowella Bandy. Kemonte Ullman at 11/26/2009 03:11 PM           [place], [id number]  Note: An exclamation mark (!) indicates a result that was not dispersed into the flowsheet. Document Creation Date: 11/26/2009 3:11 PM _______________________________________________________________________  (1) Order result status: Final Collection or observation date-time: 11/26/2009 14:43 Requested date-time:  Receipt date-time:  Reported date-time:  Referring Physician:   Ordering Physician: Delfin Edis [phone]) Specimen Source:  Source: Tawanna Cooler Order Number: [id number] Lab site:

## 2010-02-12 ENCOUNTER — Ambulatory Visit (HOSPITAL_COMMUNITY): Payer: Medicare Other

## 2010-02-12 ENCOUNTER — Ambulatory Visit (HOSPITAL_COMMUNITY): Admit: 2010-02-12 | Payer: Self-pay | Admitting: Nephrology

## 2010-02-12 ENCOUNTER — Encounter (HOSPITAL_COMMUNITY): Payer: Medicare Other | Attending: Nephrology

## 2010-02-12 DIAGNOSIS — I129 Hypertensive chronic kidney disease with stage 1 through stage 4 chronic kidney disease, or unspecified chronic kidney disease: Secondary | ICD-10-CM | POA: Insufficient documentation

## 2010-02-12 DIAGNOSIS — D631 Anemia in chronic kidney disease: Secondary | ICD-10-CM | POA: Insufficient documentation

## 2010-02-12 DIAGNOSIS — N184 Chronic kidney disease, stage 4 (severe): Secondary | ICD-10-CM | POA: Insufficient documentation

## 2010-02-24 ENCOUNTER — Emergency Department (HOSPITAL_COMMUNITY): Payer: Medicare Other

## 2010-02-24 ENCOUNTER — Inpatient Hospital Stay (HOSPITAL_COMMUNITY)
Admission: EM | Admit: 2010-02-24 | Discharge: 2010-02-27 | DRG: 683 | Disposition: A | Discharge status: Home Health | Payer: Medicare Other | Attending: Internal Medicine | Admitting: Internal Medicine

## 2010-02-24 DIAGNOSIS — B961 Klebsiella pneumoniae [K. pneumoniae] as the cause of diseases classified elsewhere: Secondary | ICD-10-CM | POA: Diagnosis present

## 2010-02-24 DIAGNOSIS — I509 Heart failure, unspecified: Secondary | ICD-10-CM | POA: Diagnosis present

## 2010-02-24 DIAGNOSIS — IMO0002 Reserved for concepts with insufficient information to code with codable children: Secondary | ICD-10-CM | POA: Diagnosis present

## 2010-02-24 DIAGNOSIS — E1149 Type 2 diabetes mellitus with other diabetic neurological complication: Secondary | ICD-10-CM | POA: Diagnosis present

## 2010-02-24 DIAGNOSIS — I129 Hypertensive chronic kidney disease with stage 1 through stage 4 chronic kidney disease, or unspecified chronic kidney disease: Secondary | ICD-10-CM | POA: Diagnosis present

## 2010-02-24 DIAGNOSIS — F05 Delirium due to known physiological condition: Secondary | ICD-10-CM | POA: Diagnosis present

## 2010-02-24 DIAGNOSIS — M23359 Other meniscus derangements, posterior horn of lateral meniscus, unspecified knee: Secondary | ICD-10-CM | POA: Diagnosis present

## 2010-02-24 DIAGNOSIS — D509 Iron deficiency anemia, unspecified: Secondary | ICD-10-CM | POA: Diagnosis present

## 2010-02-24 DIAGNOSIS — I5022 Chronic systolic (congestive) heart failure: Secondary | ICD-10-CM | POA: Diagnosis present

## 2010-02-24 DIAGNOSIS — N390 Urinary tract infection, site not specified: Secondary | ICD-10-CM | POA: Diagnosis present

## 2010-02-24 DIAGNOSIS — R52 Pain, unspecified: Secondary | ICD-10-CM | POA: Diagnosis present

## 2010-02-24 DIAGNOSIS — G8929 Other chronic pain: Secondary | ICD-10-CM | POA: Diagnosis present

## 2010-02-24 DIAGNOSIS — M23329 Other meniscus derangements, posterior horn of medial meniscus, unspecified knee: Secondary | ICD-10-CM | POA: Diagnosis present

## 2010-02-24 DIAGNOSIS — N184 Chronic kidney disease, stage 4 (severe): Secondary | ICD-10-CM | POA: Diagnosis present

## 2010-02-24 DIAGNOSIS — M109 Gout, unspecified: Secondary | ICD-10-CM | POA: Diagnosis present

## 2010-02-24 DIAGNOSIS — M1710 Unilateral primary osteoarthritis, unspecified knee: Secondary | ICD-10-CM | POA: Diagnosis present

## 2010-02-24 DIAGNOSIS — E1142 Type 2 diabetes mellitus with diabetic polyneuropathy: Secondary | ICD-10-CM | POA: Diagnosis present

## 2010-02-24 DIAGNOSIS — N179 Acute kidney failure, unspecified: Principal | ICD-10-CM | POA: Diagnosis present

## 2010-02-24 LAB — CBC
HCT: 37 % (ref 36.0–46.0)
MCV: 74.1 fL — ABNORMAL LOW (ref 78.0–100.0)
RDW: 14.4 % (ref 11.5–15.5)
WBC: 7.1 10*3/uL (ref 4.0–10.5)

## 2010-02-24 LAB — URINALYSIS, ROUTINE W REFLEX MICROSCOPIC
Bilirubin Urine: NEGATIVE
Hgb urine dipstick: NEGATIVE
Specific Gravity, Urine: 1.02 (ref 1.005–1.030)
Urine Glucose, Fasting: NEGATIVE mg/dL
pH: 5 (ref 5.0–8.0)

## 2010-02-24 LAB — COMPREHENSIVE METABOLIC PANEL
ALT: 10 U/L (ref 0–35)
AST: 17 U/L (ref 0–37)
CO2: 28 mEq/L (ref 19–32)
Calcium: 9 mg/dL (ref 8.4–10.5)
Chloride: 100 mEq/L (ref 96–112)
GFR calc Af Amer: 16 mL/min — ABNORMAL LOW (ref >60)
GFR calc non Af Amer: 13 mL/min — ABNORMAL LOW (ref >60)
Potassium: 3.7 mEq/L (ref 3.5–5.1)
Sodium: 140 mEq/L (ref 135–145)
Total Bilirubin: 0.5 mg/dL (ref 0.3–1.2)

## 2010-02-24 LAB — BRAIN NATRIURETIC PEPTIDE: Pro B Natriuretic peptide (BNP): 702 pg/mL — ABNORMAL HIGH (ref 0.0–100.0)

## 2010-02-24 LAB — POCT CARDIAC MARKERS: Troponin i, poc: <0.05 ng/mL (ref 0.00–0.09)

## 2010-02-24 LAB — GLUCOSE, CAPILLARY

## 2010-02-24 LAB — CK TOTAL AND CKMB (NOT AT ARMC)
CK, MB: 2.3 ng/mL (ref 0.3–4.0)
Relative Index: INVALID (ref 0.0–2.5)
Total CK: 94 U/L (ref 7–177)

## 2010-02-24 LAB — DIFFERENTIAL
Eosinophils Relative: 0 % (ref 0–5)
Lymphocytes Relative: 14 % (ref 12–46)
Lymphs Abs: 1 10*3/uL (ref 0.7–4.0)

## 2010-02-24 LAB — APTT: aPTT: 28 seconds (ref 24–37)

## 2010-02-24 LAB — URINE MICROSCOPIC-ADD ON

## 2010-02-25 ENCOUNTER — Encounter: Payer: Self-pay | Admitting: Orthopedic Surgery

## 2010-02-25 ENCOUNTER — Inpatient Hospital Stay (HOSPITAL_COMMUNITY): Payer: Medicare Other

## 2010-02-25 LAB — GLUCOSE, CAPILLARY
Glucose-Capillary: 245 mg/dL — ABNORMAL HIGH (ref 70–99)
Glucose-Capillary: 348 mg/dL — ABNORMAL HIGH (ref 70–99)
Glucose-Capillary: 353 mg/dL — ABNORMAL HIGH (ref 70–99)

## 2010-02-25 LAB — LIPID PANEL
HDL: 40 mg/dL (ref >39)
Triglycerides: 59 mg/dL (ref <150)
VLDL: 12 mg/dL (ref 0–40)

## 2010-02-25 LAB — CBC
MCHC: 31.6 g/dL (ref 30.0–36.0)
MCV: 73.8 fL — ABNORMAL LOW (ref 78.0–100.0)
Platelets: 234 10*3/uL (ref 150–400)
RDW: 14.4 % (ref 11.5–15.5)
WBC: 6 10*3/uL (ref 4.0–10.5)

## 2010-02-25 LAB — RENAL FUNCTION PANEL
Albumin: 2.7 g/dL — ABNORMAL LOW (ref 3.5–5.2)
CO2: 27 mEq/L (ref 19–32)
Calcium: 9 mg/dL (ref 8.4–10.5)
Chloride: 101 mEq/L (ref 96–112)
GFR calc Af Amer: 19 mL/min — ABNORMAL LOW (ref >60)
GFR calc non Af Amer: 16 mL/min — ABNORMAL LOW (ref >60)
Sodium: 137 mEq/L (ref 135–145)

## 2010-02-25 LAB — TSH: TSH: 1.034 u[IU]/mL (ref 0.350–4.500)

## 2010-02-25 LAB — HEMOGLOBIN A1C: Mean Plasma Glucose: 137 mg/dL — ABNORMAL HIGH (ref <117)

## 2010-02-25 LAB — DIFFERENTIAL
Eosinophils Absolute: 0 10*3/uL (ref 0.0–0.7)
Eosinophils Relative: 0 % (ref 0–5)
Lymphs Abs: 0.4 10*3/uL — ABNORMAL LOW (ref 0.7–4.0)
Monocytes Absolute: 0.1 10*3/uL (ref 0.1–1.0)

## 2010-02-25 LAB — CARDIAC PANEL(CRET KIN+CKTOT+MB+TROPI)
CK, MB: 2.4 ng/mL (ref 0.3–4.0)
Total CK: 74 U/L (ref 7–177)
Troponin I: 0.02 ng/mL (ref 0.00–0.06)

## 2010-02-26 ENCOUNTER — Encounter: Payer: Self-pay | Admitting: Orthopedic Surgery

## 2010-02-26 ENCOUNTER — Ambulatory Visit (HOSPITAL_COMMUNITY): Payer: Medicare Other

## 2010-02-26 LAB — CBC
HCT: 35.7 % — ABNORMAL LOW (ref 36.0–46.0)
MCV: 73.8 fL — ABNORMAL LOW (ref 78.0–100.0)
RBC: 4.84 MIL/uL (ref 3.87–5.11)
WBC: 6.8 10*3/uL (ref 4.0–10.5)

## 2010-02-26 LAB — DIFFERENTIAL
Lymphocytes Relative: 8 % — ABNORMAL LOW (ref 12–46)
Lymphs Abs: 0.5 10*3/uL — ABNORMAL LOW (ref 0.7–4.0)
Neutrophils Relative %: 90 % — ABNORMAL HIGH (ref 43–77)

## 2010-02-26 LAB — BASIC METABOLIC PANEL
Chloride: 106 mEq/L (ref 96–112)
GFR calc Af Amer: 19 mL/min — ABNORMAL LOW (ref >60)
Potassium: 4.9 mEq/L (ref 3.5–5.1)

## 2010-02-26 LAB — GLUCOSE, CAPILLARY
Glucose-Capillary: 261 mg/dL — ABNORMAL HIGH (ref 70–99)
Glucose-Capillary: 398 mg/dL — ABNORMAL HIGH (ref 70–99)

## 2010-02-26 LAB — URINE CULTURE: Colony Count: >=100000

## 2010-02-26 LAB — URIC ACID: Uric Acid, Serum: 10.7 mg/dL — ABNORMAL HIGH (ref 2.4–7.0)

## 2010-02-27 ENCOUNTER — Encounter: Payer: Self-pay | Admitting: Orthopedic Surgery

## 2010-02-27 LAB — BASIC METABOLIC PANEL
Calcium: 9.5 mg/dL (ref 8.4–10.5)
GFR calc Af Amer: 20 mL/min — ABNORMAL LOW (ref >60)
GFR calc non Af Amer: 16 mL/min — ABNORMAL LOW (ref >60)
Glucose, Bld: 184 mg/dL — ABNORMAL HIGH (ref 70–99)
Sodium: 141 mEq/L (ref 135–145)

## 2010-02-27 LAB — CBC
HCT: 32.9 % — ABNORMAL LOW (ref 36.0–46.0)
RDW: 14.4 % (ref 11.5–15.5)
WBC: 7.8 10*3/uL (ref 4.0–10.5)

## 2010-02-27 LAB — DIFFERENTIAL
Basophils Absolute: 0 10*3/uL (ref 0.0–0.1)
Eosinophils Relative: 0 % (ref 0–5)
Lymphocytes Relative: 16 % (ref 12–46)
Neutro Abs: 5.9 10*3/uL (ref 1.7–7.7)

## 2010-02-27 LAB — GLUCOSE, CAPILLARY
Glucose-Capillary: 134 mg/dL — ABNORMAL HIGH (ref 70–99)
Glucose-Capillary: 161 mg/dL — ABNORMAL HIGH (ref 70–99)

## 2010-02-27 NOTE — H&P (Signed)
NAME:  [name], [name] NO.:  [id number]  MEDICAL RECORD NO.:  [id number]           PATIENT TYPE:  I  LOCATION:  A316                          FACILITY:  [facility]  PHYSICIAN:  Rexene Alberts, M.D.    DATE OF BIRTH:  [birth year]  DATE OF ADMISSION:  02/24/2010 DATE OF DISCHARGE:  LH                             HISTORY & PHYSICAL   PRIMARY CARE PHYSICIAN:  Dr. Jeanne Ivan in [place], [place].  PRIMARY NEPHROLOGIST:  Dr. Alison Murray.  CHIEF COMPLAINT:  Right leg pain and confusion as reported by her family.  HISTORY OF PRESENT ILLNESS:  The patient is an 77 year old woman with a past medical history significant for diastolic congestive heart failure, stage IV chronic kidney disease, type 2 diabetes mellitus, and morbid obesity.  She presents to the Emergency Department today with a chief complaint of right leg pain.  The pain started approximately 3 weeks ago and has become progressively worse.  The pain is primarily in her lower legs starting at the knee and radiating down to her right foot.  She has no pain in her hip.  She has no pain in any other extremity, but the right lower leg.  She denies trauma that precipitated the pain.  She did have a mild fall, according to her daughters, [name], as a consequence of her right leg giving away.  The patient says that the pain feels like muscle spasms.  On a scale of 1 to 10, her pain is 10/10 in intensity. Her primary care physician, prescribed tramadol.  The tramadol helped only a little.  The patient's daughter, [name], reports that the patient has been "talking out of her head."  Apparently, she has been hallucinating.  At one point, she thought that there was a baby in the bed with her.  On another occasion, she was witnessed talking to her grandson who was actually not  present.  The increase in confusion worsened after she started taking tramadol; however, [name] emphasizes that she began having confusion  prior to taking the tramadol.  The patient herself does not believe anything is wrong.  She does not believe she has been confused. There has been no witnessed facial droop, slurred speech, or difficulty swallowing.  She has had no fever, chills, chest pain, chest congestion, shortness of breath, abdominal pain, or pain with urination.  She has had no nausea, vomiting, or diarrhea.  She has had a global headache periodically for the past 2-3 days.  In the Emergency Department, the patient is noted to be hemodynamically stable and mildly hypertensive with a blood pressure 144/53.  Her EKG reveals sinus bradycardia with a first-degree AV block and lateral T- wave inversions.  The CT scan of her head reveals no acute abnormalities, progressive atrophy, and stable minimal chronic small vessel white matter ischemic changes.  Her urinalysis reveals too numerous to count WBCs and many bacteria.  Her glucose is 210.  Her BUN is 101.  Her creatinine is 3.35.  She is being admitted for further evaluation and management.  PAST MEDICAL HISTORY: 1. Chronic diastolic congestive heart failure.  Her ejection fraction  was noted to be 60%-65% per the 2-D echocardiogram in 2011. 2. Stage IV chronic kidney disease. 3. Hypertension. 4. Type 2 diabetes mellitus. 5. Microcytic anemia with a history of transfusions in the past. 6. EGD and colonoscopy performed by Dr. Delfin Edis, in November 2011     revealed unremarkable results per the discharge summary in November     2011. 7. Acute cholecystitis/cholangitis, status post ERCP and stone     extraction by Dr. Laural Golden in October 2011. 8. Streptococcus D bacteremia with a negative TEE following the ERCP. 9. History of urinary tract infections. 10.Previous stroke with no lasting residuals. 11.Chronic episodic sinus bradycardia. 12.Hyperlipidemia. 13.History of uterine cancer, status post hysterectomy. 14.Depression. 15.Morbid obesity. 16.Degenerative  joint disease. 17.History of bilateral cataract surgery. 18.Glaucoma.  HOME MEDICATIONS: 1. Epogen injection per Dr. Lowanda Foster every 2 weeks. 2. Nexium 40 mg daily. 3. Guanfacine 2 mg at bedtime. 4. Hydralazine 50 mg every 8 hours. 5. Citalopram 20 mg daily. 6. Isosorbide dinitrate ER 40 mg three times daily. 7. Amlodipine 10 mg daily. 8. Poly-iron Forte 150 mg daily. 9. Gabapentin 600 mg at bedtime. 10.Furosemide 80 mg b.i.d. 11.Aspirin 81 mg daily. 12.Simvastatin 10 mg at bedtime. 13.Carvedilol 12.5 mg b.i.d. 14.Tramadol/acetaminophen 1 tablet b.i.d. p.r.n. for pain. 15.70/30 insulin versus NPH insulin 12 units in the morning and 8     units in the evening.  ALLERGIES:  No known drug allergies.  SOCIAL HISTORY:  The patient is both divorced and widowed.  She lives alone in [place], [place].  Her daughter, Ms. Javier Glazier comes by a few hours daily.  She had 10 children in all.  One daughter died of cancer.  One son died of cancer.  Another son died from a motor vehicle accident.  She is a retired Oncologist.  She denies tobacco, alcohol, and illicit drug use.  She does not drive.  Prior to 3 weeks ago, she was ambulating with a walker.  FAMILY HISTORY:  Her mother died at 62 years of age of an unknown illness.  Her father died of a heart attack.  He was in his 9s.  REVIEW OF SYSTEMS:  As above in the history present illness.  PHYSICAL EXAMINATION:  VITAL SIGNS:  Temperature 98.3, blood pressure 144/53, pulse 77, respiratory rate 17, oxygen saturation 97% on room air. GENERAL:  The patient is a pleasant, obese, 77 year old elderly African American woman, who is currently sitting up in bed in no acute distress. HEENT:  Head is normocephalic nontraumatic.  Pupils equal, round, and reactive to light.  Extraocular muscles are intact.  Conjunctivae are clear.  Sclerae are white.  Tympanic membranes not examined.  Nasal mucosa is moist with no sinus tenderness.   Oropharynx reveals mildly dry mucous membranes.  No posterior exudates or erythema. NECK:  Supple.  No adenopathy.  No thyromegaly.  No bruit.  No JVD. LUNGS:  Decreased breath sounds in the bases, otherwise clear. HEART:  S1, S2 with a [id number] systolic murmur. ABDOMEN:  Obese.  Positive bowel sounds, soft, nontender, nondistended. No hepatosplenomegaly.  There are well-healed keloid scars in the epigastrium and right upper quadrant.  No masses palpated. GU AND RECTAL:  Deferred. EXTREMITIES:  Trace of pedal edema bilaterally.  Pedal pulses not palpable by my exam.  Both feet are warm and dry.  She is able to move all of her extremities with excellent range of motion with the exception of the right leg.  Upon palpation of the right hip, there is  no tenderness or warmth.  There is no tenderness of her quadriceps or proximal right leg.  There is some puffiness and/or effusion around the right knee, but the knee is not warm or erythematous.  She is exquisitely tender upon palpation.  She is also exquisitely tender over the pretibial area and her calf muscles on the right leg.  She is also exquisitely tender over her entire right foot.  The patient is unable or unwilling to move the right leg secondary to pain. NEUROLOGIC:  The patient is alert and oriented x3.  Cranial nerves II through XII are intact.  Strength is 5/5 throughout with exception of the right leg.  Sensation is grossly decreased of the plantar surfaces bilaterally.  LABORATORY DATA:  Admission laboratories; urinalysis negative nitrites, moderate leukocytes.  Micro urine too numerous to count WBCs and many bacteria.  CK-MB less than 1, troponin I less than 0.05, myoglobin 340. WBC 7.1, hemoglobin 11.8, platelet count 205.  Sodium 140, potassium 3.7, chloride 100, CO2 of 28, glucose 210, BUN 101, creatinine 3.35, total bilirubin 0.5, alkaline phosphatase 59.  SGOT 17, SGPT 10, total protein 7.0, albumin 2.8, calcium  9.0.  ASSESSMENT: 1. Delirium with confusion.  The patient is certainly not confused     now.  There appears to be no focal neurological deficits although     the patient is unwilling or unable to move her right leg due to     pain.  The patient's speech is clear.  She provides an excellent     history.  The confusion or delirium as reported by her family may     be the consequence of an urinary tract infection, worsening renal     failure, or the effects of tramadol. 2. Acute renal failure superimposed on stage IV chronic kidney     disease.  The patient's BUN is 101 and her creatinine is 3.35.  In     January, her BUN was 89, and her creatinine was 3.61.  In November,     her BUN was 46 and her creatinine was 2.99.  There appears to be no     obvious signs of uremia.  She appears to be somewhat volume     depleted.  However, the recent elevations may be simply the     consequence of progressive kidney disease. 3. Urinary tract infection.  The patient has a history of frequent     urinary tract infections in the past. 4. Type 2 diabetes mellitus.  She is treated with either NPH insulin     or 70/30 insulin.  Her venous glucose is moderately elevated     currently. 5. Hypertension.  The patient has had malignant hypertension in the     past.  She is treated with multiple antihypertensive medications. 6. Chronic microcytic anemia.  Based on the discharge summary from     November 2011, the patient underwent an upper endoscopy and     colonoscopy at [facility].  The results were reported as     unremarkable.  It is likely that the patient has anemia of chronic     kidney disease.  She receives I believe Epogen injections as     ordered by Dr., Lowanda Foster every 2 weeks. 7. Right leg pain.  The patient has been virtually bedridden for the     past week according to her family.  She has had no known trauma.     The pain appears to be  out of proportion to the examination.  She      does have a history of neuropathic pain from diabetic neuropathy.     She has no history of gout.  The etiology is unclear at this time.     However, gout, severe neuropathic pain, or myositis are     considerations.  PLAN: 1. We will hold Lasix and start gentle IV fluids.  We will monitor her     renal function closely.  We will also monitor her pulmonary status     as she does have a history of congestive heart failure. 2. We will consult nephrologist Dr. Lowanda Foster. 3. We will culture her urine and then start Rocephin empirically. 4. We will order an MRI of the right knee to start the evaluation of     her leg pain.  We will also check a sed rate, uric acid level, and     creatine kinase. 5. We will attempt to manage her pain with arthritis strength Tylenol     and small doses of morphine.  We will also start prednisone     empirically. 6. We will consult the physical therapist.     Rexene Alberts, M.D.     DF/MEDQ  D:  02/24/2010  T:  02/24/2010  Job:  [id number]  cc:   Sherryle Lis, M.D. [id number]  Electronically Signed by Rexene Alberts M.D. on 02/27/2010 09:01:26 AM

## 2010-02-28 NOTE — Consult Note (Signed)
  NAME:  [name], [name]              ACCOUNT NO.:  [id number]  MEDICAL RECORD NO.:  [id number]           PATIENT TYPE:  I  LOCATION:  A316                          FACILITY:  [facility]  PHYSICIAN:  Carole Civil, M.D.DATE OF BIRTH:  Feb 10, 1925  DATE OF CONSULTATION:  02/26/2010 DATE OF DISCHARGE:                                CONSULTATION   REASON FOR CONSULTATION:  Pain, right knee.  This is an 77 year old female was admitted to the hospital for confusion.  During her workup, it was noted that she had pain in her right leg and difficulty ambulating.  She has a history of chronic heart failure, stage IV kidney disease, hypertension, type 2 diabetes, microcytic anemia, history of recurrent urinary tract infections, previous CVA with no residual deficits, sinus bradycardia, chronic hyperlipidemia, history of uterine cancer status post hysterectomy, history of depression, obesity, joint disease, glaucoma.  She has no known drug allergies.  She is currently living alone in [place].  She has 10 children.  Her daughter helps her with daily care.  She is a retired Oncologist.  Does not smoke or drink.  FAMILY HISTORY:  Her father died of a heart attack in his 29s.  Her mother died of an unknown cause at 53.  REVIEW OF SYSTEMS:  Initially the patient had 10/10 pain in her right knee and right leg radiating from her knee to her ankle, however, now she has 0/10 pain and is ambulating down the hallway and back with no difficulty  Vital signs are temperature 97.9, pulse 59 respiratory rate 18, blood pressure 168/86, pain level is 0/10.  Her right knee is evaluated.  She has a small joint effusion.  It is warm to touch.  There is minimal tenderness surrounding the joint.  It is in slight valgus malalignment.  She has a slight flexion contracture 5 degrees.  She has a flexion arc of 110 degrees.  Her knee remains stable.  She has full power in extension as well as flexion.  She  has some peripheral venous stasis changes in the skin, minimal pretibial edema and tenderness related to venous stasis disease.  Her dorsalis pedis pulse is strong and 2+.  She was awake, alert.  She was oriented x3.  She answered questions appropriately.  Her appearance was normal.  She had an MRI of her right knee which showed end-stage joint disease with arthritis in all 3 compartments worst laterally.  She had diffuse synovitis with effusion, chronic degenerative tears of the lateral meniscus and medial meniscus.  IMPRESSION:  Acute arthritic effusion possibly related to gout, seemed to respond to fluids, rest and activity modification, now ambulating normally.  No further treatment is necessary.  The history and physical and the chart has been reviewed along with the MRI and MRI report, previous knee x-ray from several years ago.  The dictated history and physical that is incorporated by reference.     Carole Civil, M.D.     SEH/MEDQ  D:  02/26/2010  T:  02/26/2010  Job:  [id number]  Electronically Signed by Arther Abbott M.D. on 02/28/2010 10:38:58 AM

## 2010-03-01 NOTE — Discharge Summary (Signed)
NAME:  [name], [name]              ACCOUNT NO.:  [id number]  MEDICAL RECORD NO.:  [id number]           PATIENT TYPE:  I  LOCATION:  A316                          FACILITY:  [facility]  PHYSICIAN:  Rexene Alberts, M.D.    DATE OF BIRTH:  1925-03-10  DATE OF ADMISSION:  02/24/2010 DATE OF DISCHARGE:  02/17/2012LH                         DISCHARGE SUMMARY-REFERRING   DISCHARGE DIAGNOSES: 1. Acute on chronic right leg pain.  The presumptive diagnoses were     acute gout and acute flare-up of degenerative joint disease of the     knee. 2. Per the MRI of the right knee on February 25, 2010, there was     severe tricompartmental osteoarthritis, worse in the lateral     compartment, degenerative effusion and synovitis, maceration of the     posterior horn and body of the lateral meniscus with oblique     undersurface tear of the medial meniscus body and posterior horn as     well as fatty muscular atrophy.  Conservative treatment was     recommended by orthopedic surgeon Dr. Aline Brochure. 3. Klebsiella urinary tract infection. 4. Delirium with confusion, likely secondary to the urinary tract     infection and tramadol. 5. Acute renal failure superimposed on stage IV chronic kidney     disease. 6. Type 2 diabetes mellitus with worsening hyperglycemia secondary to     prednisone. 7. Malignant hypertension.  DISCHARGE MEDICATIONS: 1. Prednisone 10 mg daily for five more days. 2. Ceftin 500 mg b.i.d. for four more days. 3. Lasix.  Dosing to be determined by Dr. Lowanda Foster prior to hospital     discharge. 4. Tylenol 325 mg 2 tablets every 4 hours as needed for pain and     fever. 5. Gabapentin 600 mg daily. 6. Citalopram 20 mg daily. 7. Carvedilol 12.5 mg b.i.d. 8. Guanfacine 2 mg daily. 9. Amlodipine 10 mg daily. 10.Hydralazine 50 mg every 8 hours. 11.Simvastatin 10 mg daily. 12.NovoLog 70/30, the dose was increased to 15 units subcu twice     daily. 13.Poly-iron 150 mg daily. 14.Isosorbide  dinitrate SR 40 mg three times daily. 15.Aspirin 81 mg daily. 16.Nexium 40 mg daily. 17.Stop tramadol.  DISCHARGE DISPOSITION:  The patient is approaching hospital discharge. She is currently stable and in improved condition.  The plan is to discharge her to home tomorrow February 27, 2010.  She will follow up with her primary care physician, Dr. Jeanne Ivan in [place], [place] on March 12, 2010 at 11 o'clock a.m.  An appointment will be scheduled for her to follow up with Dr. Aline Brochure in 2-3 weeks.  An appointment will be scheduled for her to follow up with Dr. Lowanda Foster in 2 weeks.  CONSULTATIONS: 1. Alison Murray, MD 2. Carole Civil, MD  PROCEDURE PERFORMED: 1. MRI of the right knee on February 25, 2010.  The results were     dictated above. 2. CT scan of the head on February 24, 2010.  The results revealed no     acute abnormalities.  Mild progressive atrophy.  Stable minimal     chronic small vessel white matter ischemic changes in  both cerebral     hemispheres. 3. Chest x-ray on February 24, 2010.  The results revealed mild     bibasilar atelectasis.  Mild cardiomegaly. 4. Bilateral lower extremity venous duplex of the lower extremities on     February 24, 2010.  The results revealed a normal study.  No     evidence for deep venous thrombosis.  HISTORY OF PRESENT ILLNESS:  The patient is an 77 year old woman with a past medical history significant for diastolic congestive heart failure, stage IV chronic kidney disease, type 2 diabetes mellitus, and morbid obesity.  She presented to the emergency department on February 24, 2010 with a chief complaint of right leg pain.  She was started on outpatient treatment with tramadol.  Her pain did not subside.  Her daughter reports that the patient had become sedentary due to the pain.  She had also become confused and had hallucinated on a couple of occasions. During the initial evaluation, the patient was noted to  be hemodynamically stable and mildly hypertensive with a blood pressure 144/53.  Her EKG revealed sinus bradycardia with a first-degree AV block.  The CT scan of her head revealed no acute intracranial abnormalities.  Her urinalysis revealed too numerous to count WBCs and many bacteria.  Her BUN was 101 and her creatinine was 3.35.  She was admitted for further evaluation and management.  HOSPITAL COURSE: 1. RIGHT LOWER EXTREMITY LEG PAIN.  The patient was exquisitely tender     over her right knee extending down to her right foot.  She had no     complaints of right hip pain.  A venous Doppler was ordered in the     emergency department and it was negative for DVT.  Acute     osteoarthritis with exacerbation of the right knee was presumed.     Also because of the exquisite tenderness, gout was also considered.     A uric acid level was ordered, it was quite elevated at 12.0.     Given this finding, she was given colchicine 0.6 mg only once     because of her renal failure.  She was also started on a prednisone     taper.  An MRI of her right knee was ordered as well.  The results     were dictated above.  Given the severity of the findings,     orthopedic surgeon Dr. Aline Brochure was consulted.  At the time of his     evaluation, the patient was actually ambulating with the physical     therapist because her pain had subsided so substantially.  He      recommended physical therapy and pain management.  He would like to     follow up with her as needed in his office.  As stated, the     patient's pain resolved quite rapidly on colchicine and prednisone.     This was somewhat surprising as the patient had exquisite pain on     admission.  She is currently in improved and stable condition.  The     physical therapist recommended home health physical therapy as     desired.  She is going to be discharged to home on five more days     of prednisone at 10 mg daily. 2. KLEBSIELLA PNEUMONIAE  URINARY TRACT INFECTION.  The patient's     urinalysis was indicative of infection.  A urine culture was     ordered and she was  subsequently started on Rocephin.  The culture     grew out greater than 100,000 colonies of Klebsiella pneumoniae,     sensitive to Rocephin.  She will be discharged home on four more     days of Ceftin after receiving 4 days of Rocephin in the hospital. 3. HYPERTENSION.  The patient's blood pressure increased on IV fluid     hydration and steroid therapy.  She was maintained on her usual     antihypertensive medications without any increase.  Hopefully     following the discontinuation of IV fluids and tapering off the     prednisone, her blood pressure will improve. 4. ACUTE RENAL FAILURE SUPERIMPOSED ON STAGE IV CHRONIC KIDNEY     DISEASE.  The patient's BUN and creatinine were above baseline on     admission.  Lasix was initially withheld and she was started on     gentle IV fluids for 24 hours.  Her BUN improved from 101 to 91 and     her creatinine improved from 3.35 to 2.94.  Dr. Lowanda Foster, her     nephrologist was consulted.  He recommended restarting Lasix at 40     mg IV b.i.d.  It is likely that the patient will be discharged to     home tomorrow.  She had been treated with 80 mg of Lasix b.i.d.     prior to this hospitalization.  We will ask Dr. Lowanda Foster to     recommend a home dose of Lasix for the patient as she appeared to     have been volume depleted prior to this hospitalization. 5. TYPE 2 DIABETES MELLITUS.  The patient is treated chronically with     70/30 insulin.  During this hospitalization, she was treated with     b.i.d. dosing of NPH and sliding scale NovoLog before each meal and     at bedtime.  Her hemoglobin A1c was noted to be 6.4.  Her capillary     blood glucose did increase on steroid therapy.  The dose of NPH was     increased to 15 units b.i.d.  She will be discharged on 70/30     insulin 15 units b.i.d., which is a higher dose  than she had been     taking prior to this hospitalization.  Hopefully, her capillary     blood glucose will improve, off prednisone.  The patient was     advised to follow up with her primary care physician for     readjustment in her insulin dosing following the discontinuation of     prednisone. 6. HYPERLIPIDEMIA.  The patient was maintained on statin therapy.  Her     total cholesterol was 129, triglycerides 59, HDL cholesterol 40,     and LDL cholesterol of 77. 7. DELIRIUM.  There was no evidence of confusion during the hospital     course, although her altered mental status at home was likely     due to Tramadol and the UTI.  DISPOSITION:  The patient is currently stable and in improved condition. The plan is to discharge her to home tomorrow.  We will ask Dr. Lowanda Foster to comment on the recommended dose of Lasix for the patient prior to hospital discharge given that she did present with acute on chronic renal failure.   ADDENDUM:  The discharge dose of lasix recommended by Dr Lowanda Foster was 40 mg daily.       Rexene Alberts, M.D.  DF/MEDQ  D:  02/26/2010  T:  02/26/2010  Job:  [id number]  cc:   Eula Fried MD Jaci Lazier, [facility]  Alison Murray, M.D. [phone]  Electronically Signed by Rexene Alberts M.D. on 03/01/2010 06:52:48 PM

## 2010-03-04 NOTE — Letter (Signed)
Summary: Discharge Summary  Discharge Summary   Imported By: Ihor Austin 02/27/2010 13:13:17  _____________________________________________________________________  External Attachment:    Type:   Image     Comment:   External Document

## 2010-03-04 NOTE — Consult Note (Signed)
Summary: Hospital consult RT knee pain  Hospital consult RT knee pain   Imported By: Ihor Austin 02/27/2010 11:23:22  _____________________________________________________________________  External Attachment:    Type:   Image     Comment:   External Document

## 2010-03-12 ENCOUNTER — Ambulatory Visit (HOSPITAL_COMMUNITY): Payer: Medicare Other

## 2010-03-16 NOTE — Consult Note (Signed)
NAME:  [name], [name]              ACCOUNT NO.:  [id number]  MEDICAL RECORD NO.:  [id number]           PATIENT TYPE:  I  LOCATION:  A316                          FACILITY:  [facility]  PHYSICIAN:  Alison Murray, M.D.DATE OF BIRTH:  Dec 30, 1925  DATE OF CONSULTATION: DATE OF DISCHARGE:                                CONSULTATION   REASON FOR CONSULTATION:  Worsening of renal failure.  [name] is an 77 year old female who is well known to me who has a history of chronic renal failure stage IV, history of hypertension and CVA, presently was brought to the emergency room by family members because of increased confusion and weakness of the right side of her leg.  According to [name], she was having very difficult time in moving her right leg and also she was having some pain.  It just did not stop and the patient was brought to the emergency room.  She did not have any fevers, chills, or sweating.  According to her, she felt the same way when she had previous stroke.  Presently, her BUN and creatinine were elevated from her baseline.  Hence, consult is called. The patient denies any nausea, no vomiting.  No fever or chills. Presently, she seems to be feeling better.  PAST MEDICAL HISTORY: 1. She has history of chronic renal failure stage IV. 2. History of CVA with some right-sided weakness, but has recovered     very well. 3. History of hypertension. 4. History of diabetes. 5. History of anemia, thought to be a combination of iron deficiency     and anemia of chronic disease.  Presently, she was on Epogen. 6. History of morbid obesity. 7. History of uterine CA status post hysterectomy. 8. History of cardiomyopathy with low ejection fraction. 9. History of GERD. 10.History of urinary tract infection.  PAST SURGICAL HISTORY:  She has bilateral cataract surgery and also history of hysterectomy because of uterine CA.  Her medications at this moment consist of: 1. Tylenol  650 mg p.o. t.i.d. 2. Norvasc 10 mg p.o. daily. 3. Aspirin 81 mg p.o. daily. 4. Coreg 12.5 mg p.o. b.i.d. 5. She is getting Rocephin 1 g IV q.24 h. 6. Celexa 20 mg p.o. daily. 7. Colace 100 mg p.o. daily. 8. Neurontin 600 mg p.o. nightly. 9. She is also on hydralazine 50 mg p.o. q.8 h. 10.She is on Novolin insulin. 11.Nu-Iron 150 mg p.o. daily. 12.Isosorbide 40 mg b.i.d. 13.Also, she is on Protonix 80 mg p.o. daily. 14.She has received one dose of potassium. 15.She is on Zocor 10 mg p.o. daily. 16.Normal saline running at 75 mL per hour.  ALLERGIES:  No known allergy.  FAMILY HISTORY:  No history of renal failure.  The patient has a very supportive daughter, she lives at home.  REVIEW OF SYSTEMS:  Presently, she is feeling better.  She said her weakness has improved and also, she does not have any numbness of her leg and no tenderness.  Appetite at this moment is good.  She denies any nausea or vomiting.  No diarrhea.  She denies also any fever, chills, or sweating.  She does  not have any also difficulty breathing.  No orthopnea.  No paroxysmal nocturnal dyspnea.  PHYSICAL EXAMINATION:  VITAL SIGNS:  Her blood pressure is 103/66, before that was 130/71, temperature 98, pulse of 55. CHEST:  Decreased breath sounds, otherwise seems to be clear.  No rales, no rhonchi.  No egophony. HEART:  Regular rate and rhythm.  No murmur.  No S3. ABDOMEN:  Soft, nontender.  Positive bowel sound. EXTREMITIES:  She has only trace edema.  She does not have any tenderness.  Her white blood cell count is 6, hemoglobin 12.2, hematocrit 38.6.  Her sodium is 137, potassium is 4.5, BUN is 90, creatinine is 2.8.  When she came, her creatinine has gone up up to 3.61 and BUN of also 101.  Her phosphorus is 4.1, albumin is 27.7, calcium of 9.  CT scan of the head did not show any acute findings.  This shows mild atrophy.  Chest x-ray; mild cardiomegaly, mild bibasilar atelectasis.  ASSESSMENT: 1.  Renal failure, at this moment, probably acute on chronic in     presence of high [id number] ratio possibly.  The patient might     have superimposed prerenal syndrome.  The patient has been on     diuretics before.  Her urine specific gravity also seems to be     high.  Presently, her BUN and creatinine seems to be coming down.     Her baseline creatinine is between 2.7-2.8. 2. Anemia is a combination of iron deficiency and anemia of chronic     disease.  The patient was on Nu-Iron as an outpatient.  Also, she     was getting Epogen.  Her H and H at this moment has improved. 3. Right-sided weakness with confusion.  I am not sure whether the     patient has transient ischemic attack.  This is the same     distribution.  She had cerebrovascular accident from a previous     infiltrate, presently has improved. 4. History of hypertension.  Blood pressure overall controlled very     well. 5. History of diastolic dysfunction.  She does not have any sign of     fluid overload. 6. History of diabetes. 7. History of obesity. 8. History of uterine cancer status post hysterectomy.  No sign of     recurrence. 9. History of confusion. 10.History of diabetes.  She is on insulin. 11.History of hyperuricemia.  Possibly, this also could be from     moderate dehydration.  The patient has history of degenerative     joint disease, possible gout, but no recent activation.  RECOMMENDATION:  I agree with present hydration.  We will just follow the input and output since her renal function seems to be coming to baseline.  At this moment, we will continue this present management.  If the patient is going to be discharged, I will follow her as an outpatient as she has her regular appointment.  We will continue the other medications and we will follow the patient.     Alison Murray, M.D.     BB/MEDQ  D:  02/25/2010  T:  02/25/2010  Job:  [id number]  Electronically Signed by Alison Murray  M.D. on 03/16/2010 01:51:36 PM

## 2010-03-17 ENCOUNTER — Ambulatory Visit: Payer: Medicare Other | Admitting: Orthopedic Surgery

## 2010-03-23 LAB — RENAL FUNCTION PANEL
Albumin: 3.3 g/dL — ABNORMAL LOW (ref 3.5–5.2)
Calcium: 8.6 mg/dL (ref 8.4–10.5)
Chloride: 102 mEq/L (ref 96–112)
Creatinine, Ser: 3.38 mg/dL — ABNORMAL HIGH (ref 0.4–1.2)
GFR calc Af Amer: 16 mL/min — ABNORMAL LOW (ref >60)
GFR calc non Af Amer: 13 mL/min — ABNORMAL LOW (ref >60)
Phosphorus: 5.9 mg/dL — ABNORMAL HIGH (ref 2.3–4.6)

## 2010-03-24 LAB — DIFFERENTIAL
Basophils Relative: 0 % (ref 0–1)
Basophils Relative: 0 % (ref 0–1)
Eosinophils Absolute: 0 10*3/uL (ref 0.0–0.7)
Eosinophils Absolute: 0.1 10*3/uL (ref 0.0–0.7)
Eosinophils Relative: 0 % (ref 0–5)
Eosinophils Relative: 1 % (ref 0–5)
Lymphs Abs: 1.1 10*3/uL (ref 0.7–4.0)
Monocytes Absolute: 0.5 10*3/uL (ref 0.1–1.0)
Neutro Abs: 4.3 10*3/uL (ref 1.7–7.7)
Neutrophils Relative %: 79 % — ABNORMAL HIGH (ref 43–77)
Neutrophils Relative %: 72 % (ref 43–77)

## 2010-03-24 LAB — COMPREHENSIVE METABOLIC PANEL
ALT: 9 U/L (ref 0–35)
AST: 12 U/L (ref 0–37)
Alkaline Phosphatase: 78 U/L (ref 39–117)
Calcium: 8.4 mg/dL (ref 8.4–10.5)
GFR calc Af Amer: 19 mL/min — ABNORMAL LOW (ref >60)
Glucose, Bld: 74 mg/dL (ref 70–99)
Potassium: 4.3 mEq/L (ref 3.5–5.1)
Sodium: 142 mEq/L (ref 135–145)
Total Protein: 5.9 g/dL — ABNORMAL LOW (ref 6.0–8.3)

## 2010-03-24 LAB — BASIC METABOLIC PANEL
BUN: 50 mg/dL — ABNORMAL HIGH (ref 6–23)
BUN: 46 mg/dL — ABNORMAL HIGH (ref 6–23)
BUN: 48 mg/dL — ABNORMAL HIGH (ref 6–23)
BUN: 49 mg/dL — ABNORMAL HIGH (ref 6–23)
CO2: 31 mEq/L (ref 19–32)
CO2: 32 mEq/L (ref 19–32)
CO2: 34 mEq/L — ABNORMAL HIGH (ref 19–32)
Calcium: 8.6 mg/dL (ref 8.4–10.5)
Calcium: 8.6 mg/dL (ref 8.4–10.5)
Chloride: 100 mEq/L (ref 96–112)
Chloride: 103 mEq/L (ref 96–112)
Chloride: 99 mEq/L (ref 96–112)
Creatinine, Ser: 2.75 mg/dL — ABNORMAL HIGH (ref 0.4–1.2)
Creatinine, Ser: 2.95 mg/dL — ABNORMAL HIGH (ref 0.4–1.2)
Creatinine, Ser: 2.99 mg/dL — ABNORMAL HIGH (ref 0.4–1.2)
GFR calc Af Amer: 19 mL/min — ABNORMAL LOW (ref >60)
GFR calc non Af Amer: 16 mL/min — ABNORMAL LOW (ref >60)
Glucose, Bld: 75 mg/dL (ref 70–99)
Glucose, Bld: 108 mg/dL — ABNORMAL HIGH (ref 70–99)
Glucose, Bld: 111 mg/dL — ABNORMAL HIGH (ref 70–99)
Potassium: 4.3 mEq/L (ref 3.5–5.1)
Sodium: 140 mEq/L (ref 135–145)

## 2010-03-24 LAB — URINE CULTURE

## 2010-03-24 LAB — GLUCOSE, CAPILLARY
Glucose-Capillary: 102 mg/dL — ABNORMAL HIGH (ref 70–99)
Glucose-Capillary: 107 mg/dL — ABNORMAL HIGH (ref 70–99)
Glucose-Capillary: 138 mg/dL — ABNORMAL HIGH (ref 70–99)
Glucose-Capillary: 142 mg/dL — ABNORMAL HIGH (ref 70–99)
Glucose-Capillary: 143 mg/dL — ABNORMAL HIGH (ref 70–99)
Glucose-Capillary: 205 mg/dL — ABNORMAL HIGH (ref 70–99)
Glucose-Capillary: 37 mg/dL — CL (ref 70–99)

## 2010-03-24 LAB — CROSSMATCH
Antibody Screen: NEGATIVE
Unit division: 0

## 2010-03-24 LAB — CBC
HCT: 20.8 % — ABNORMAL LOW (ref 36.0–46.0)
HCT: 25.5 % — ABNORMAL LOW (ref 36.0–46.0)
HCT: 27.8 % — ABNORMAL LOW (ref 36.0–46.0)
Hemoglobin: 6.6 g/dL — CL (ref 12.0–15.0)
Hemoglobin: 8.8 g/dL — ABNORMAL LOW (ref 12.0–15.0)
Hemoglobin: 9.3 g/dL — ABNORMAL LOW (ref 12.0–15.0)
MCH: 23.8 pg — ABNORMAL LOW (ref 26.0–34.0)
MCH: 23.5 pg — ABNORMAL LOW (ref 26.0–34.0)
MCH: 24.9 pg — ABNORMAL LOW (ref 26.0–34.0)
MCHC: 32.3 g/dL (ref 30.0–36.0)
MCHC: 31.7 g/dL (ref 30.0–36.0)
MCHC: 31.7 g/dL (ref 30.0–36.0)
MCHC: 32.5 g/dL (ref 30.0–36.0)
MCHC: 32.5 g/dL (ref 30.0–36.0)
MCV: 73.7 fL — ABNORMAL LOW (ref 78.0–100.0)
MCV: 74.1 fL — ABNORMAL LOW (ref 78.0–100.0)
MCV: 74.1 fL — ABNORMAL LOW (ref 78.0–100.0)
MCV: 75.3 fL — ABNORMAL LOW (ref 78.0–100.0)
MCV: 75.9 fL — ABNORMAL LOW (ref 78.0–100.0)
Platelets: 183 10*3/uL (ref 150–400)
Platelets: 138 10*3/uL — ABNORMAL LOW (ref 150–400)
RBC: 3.73 MIL/uL — ABNORMAL LOW (ref 3.87–5.11)
RDW: 16.4 % — ABNORMAL HIGH (ref 11.5–15.5)
RDW: 17.2 % — ABNORMAL HIGH (ref 11.5–15.5)
RDW: 17.5 % — ABNORMAL HIGH (ref 11.5–15.5)
WBC: 6 10*3/uL (ref 4.0–10.5)
WBC: 7.5 10*3/uL (ref 4.0–10.5)

## 2010-03-24 LAB — TSH: TSH: 1.425 u[IU]/mL (ref 0.350–4.500)

## 2010-03-24 LAB — LIPID PANEL
Cholesterol: 111 mg/dL (ref 0–200)
LDL Cholesterol: 61 mg/dL (ref 0–99)
Total CHOL/HDL Ratio: 2.8 RATIO

## 2010-03-24 LAB — CARDIAC PANEL(CRET KIN+CKTOT+MB+TROPI)
CK, MB: 1.4 ng/mL (ref 0.3–4.0)
CK, MB: 1.4 ng/mL (ref 0.3–4.0)
CK, MB: 1.4 ng/mL (ref 0.3–4.0)
Total CK: 64 U/L (ref 7–177)
Total CK: 68 U/L (ref 7–177)
Troponin I: 0.02 ng/mL (ref 0.00–0.06)
Troponin I: 0.03 ng/mL (ref 0.00–0.06)

## 2010-03-24 LAB — RENAL FUNCTION PANEL
Albumin: 2.1 g/dL — ABNORMAL LOW (ref 3.5–5.2)
BUN: 70 mg/dL — ABNORMAL HIGH (ref 6–23)
CO2: 29 mEq/L (ref 19–32)
Chloride: 106 mEq/L (ref 96–112)
Creatinine, Ser: 3.06 mg/dL — ABNORMAL HIGH (ref 0.4–1.2)
GFR calc Af Amer: 18 mL/min — ABNORMAL LOW (ref >60)
GFR calc non Af Amer: 15 mL/min — ABNORMAL LOW (ref >60)
Potassium: 3.5 mEq/L (ref 3.5–5.1)

## 2010-03-24 LAB — URINALYSIS, ROUTINE W REFLEX MICROSCOPIC
Bilirubin Urine: NEGATIVE
Glucose, UA: NEGATIVE mg/dL
Hgb urine dipstick: NEGATIVE
Nitrite: NEGATIVE
Specific Gravity, Urine: 1.009 (ref 1.005–1.030)
pH: 6 (ref 5.0–8.0)

## 2010-03-24 LAB — URINE MICROSCOPIC-ADD ON

## 2010-03-24 LAB — PROTIME-INR: INR: 1.16 (ref 0.00–1.49)

## 2010-03-24 LAB — BRAIN NATRIURETIC PEPTIDE
Pro B Natriuretic peptide (BNP): 669 pg/mL — ABNORMAL HIGH (ref 0.0–100.0)
Pro B Natriuretic peptide (BNP): 664 pg/mL — ABNORMAL HIGH (ref 0.0–100.0)
Pro B Natriuretic peptide (BNP): 747 pg/mL — ABNORMAL HIGH (ref 0.0–100.0)

## 2010-03-24 LAB — APTT: aPTT: 29 seconds (ref 24–37)

## 2010-03-24 LAB — POCT CARDIAC MARKERS

## 2010-03-24 LAB — PREPARE RBC (CROSSMATCH)

## 2010-03-24 LAB — UREA NITROGEN, URINE: Urea Nitrogen, Ur: 216 mg/dL

## 2010-03-25 LAB — CROSSMATCH: ABO/RH(D): A POS

## 2010-03-25 LAB — CULTURE, BLOOD (ROUTINE X 2): Culture  Setup Time: 201110250040

## 2010-03-25 LAB — HEPATIC FUNCTION PANEL
AST: 21 U/L (ref 0–37)
Albumin: 2.1 g/dL — ABNORMAL LOW (ref 3.5–5.2)
Albumin: 2.6 g/dL — ABNORMAL LOW (ref 3.5–5.2)
Alkaline Phosphatase: 135 U/L — ABNORMAL HIGH (ref 39–117)
Alkaline Phosphatase: 237 U/L — ABNORMAL HIGH (ref 39–117)
Alkaline Phosphatase: 83 U/L (ref 39–117)
Indirect Bilirubin: 0.4 mg/dL (ref 0.3–0.9)
Indirect Bilirubin: 1.6 mg/dL — ABNORMAL HIGH (ref 0.3–0.9)
Total Bilirubin: 1.1 mg/dL (ref 0.3–1.2)
Total Bilirubin: 5.6 mg/dL — ABNORMAL HIGH (ref 0.3–1.2)
Total Protein: 7.6 g/dL (ref 6.0–8.3)

## 2010-03-25 LAB — GLUCOSE, CAPILLARY
Glucose-Capillary: 156 mg/dL — ABNORMAL HIGH (ref 70–99)
Glucose-Capillary: 167 mg/dL — ABNORMAL HIGH (ref 70–99)
Glucose-Capillary: 178 mg/dL — ABNORMAL HIGH (ref 70–99)
Glucose-Capillary: 185 mg/dL — ABNORMAL HIGH (ref 70–99)
Glucose-Capillary: 191 mg/dL — ABNORMAL HIGH (ref 70–99)
Glucose-Capillary: 194 mg/dL — ABNORMAL HIGH (ref 70–99)
Glucose-Capillary: 202 mg/dL — ABNORMAL HIGH (ref 70–99)
Glucose-Capillary: 237 mg/dL — ABNORMAL HIGH (ref 70–99)
Glucose-Capillary: 122 mg/dL — ABNORMAL HIGH (ref 70–99)
Glucose-Capillary: 125 mg/dL — ABNORMAL HIGH (ref 70–99)
Glucose-Capillary: 130 mg/dL — ABNORMAL HIGH (ref 70–99)
Glucose-Capillary: 140 mg/dL — ABNORMAL HIGH (ref 70–99)
Glucose-Capillary: 153 mg/dL — ABNORMAL HIGH (ref 70–99)
Glucose-Capillary: 157 mg/dL — ABNORMAL HIGH (ref 70–99)
Glucose-Capillary: 158 mg/dL — ABNORMAL HIGH (ref 70–99)
Glucose-Capillary: 166 mg/dL — ABNORMAL HIGH (ref 70–99)
Glucose-Capillary: 169 mg/dL — ABNORMAL HIGH (ref 70–99)
Glucose-Capillary: 182 mg/dL — ABNORMAL HIGH (ref 70–99)
Glucose-Capillary: 195 mg/dL — ABNORMAL HIGH (ref 70–99)
Glucose-Capillary: 95 mg/dL (ref 70–99)

## 2010-03-25 LAB — CBC
HCT: 26 % — ABNORMAL LOW (ref 36.0–46.0)
HCT: 28.8 % — ABNORMAL LOW (ref 36.0–46.0)
HCT: 29 % — ABNORMAL LOW (ref 36.0–46.0)
HCT: 29.9 % — ABNORMAL LOW (ref 36.0–46.0)
HCT: 25.6 % — ABNORMAL LOW (ref 36.0–46.0)
HCT: 26.8 % — ABNORMAL LOW (ref 36.0–46.0)
HCT: 30.2 % — ABNORMAL LOW (ref 36.0–46.0)
Hemoglobin: 8.5 g/dL — ABNORMAL LOW (ref 12.0–15.0)
Hemoglobin: 9.7 g/dL — ABNORMAL LOW (ref 12.0–15.0)
Hemoglobin: 8.3 g/dL — ABNORMAL LOW (ref 12.0–15.0)
MCH: 23.9 pg — ABNORMAL LOW (ref 26.0–34.0)
MCH: 24.1 pg — ABNORMAL LOW (ref 26.0–34.0)
MCH: 24.2 pg — ABNORMAL LOW (ref 26.0–34.0)
MCH: 24.3 pg — ABNORMAL LOW (ref 26.0–34.0)
MCH: 22.5 pg — ABNORMAL LOW (ref 26.0–34.0)
MCH: 22.5 pg — ABNORMAL LOW (ref 26.0–34.0)
MCH: 22.8 pg — ABNORMAL LOW (ref 26.0–34.0)
MCHC: 32.6 g/dL (ref 30.0–36.0)
MCHC: 32.7 g/dL (ref 30.0–36.0)
MCHC: 32.8 g/dL (ref 30.0–36.0)
MCHC: 33 g/dL (ref 30.0–36.0)
MCHC: 32.4 g/dL (ref 30.0–36.0)
MCHC: 32.5 g/dL (ref 30.0–36.0)
MCHC: 32.5 g/dL (ref 30.0–36.0)
MCV: 73.8 fL — ABNORMAL LOW (ref 78.0–100.0)
MCV: 74.1 fL — ABNORMAL LOW (ref 78.0–100.0)
MCV: 74.7 fL — ABNORMAL LOW (ref 78.0–100.0)
MCV: 69.4 fL — ABNORMAL LOW (ref 78.0–100.0)
Platelets: 124 10*3/uL — ABNORMAL LOW (ref 150–400)
Platelets: 164 10*3/uL (ref 150–400)
Platelets: 172 10*3/uL (ref 150–400)
Platelets: 212 10*3/uL (ref 150–400)
RBC: 4 MIL/uL (ref 3.87–5.11)
RBC: 3.69 MIL/uL — ABNORMAL LOW (ref 3.87–5.11)
RDW: 17.1 % — ABNORMAL HIGH (ref 11.5–15.5)
RDW: 17.6 % — ABNORMAL HIGH (ref 11.5–15.5)
RDW: 15.8 % — ABNORMAL HIGH (ref 11.5–15.5)
RDW: 16.3 % — ABNORMAL HIGH (ref 11.5–15.5)
WBC: 15.7 10*3/uL — ABNORMAL HIGH (ref 4.0–10.5)

## 2010-03-25 LAB — COMPREHENSIVE METABOLIC PANEL
Albumin: 2.5 g/dL — ABNORMAL LOW (ref 3.5–5.2)
Albumin: 2.2 g/dL — ABNORMAL LOW (ref 3.5–5.2)
Alkaline Phosphatase: 203 U/L — ABNORMAL HIGH (ref 39–117)
Alkaline Phosphatase: 241 U/L — ABNORMAL HIGH (ref 39–117)
Alkaline Phosphatase: 93 U/L (ref 39–117)
BUN: 47 mg/dL — ABNORMAL HIGH (ref 6–23)
BUN: 52 mg/dL — ABNORMAL HIGH (ref 6–23)
BUN: 56 mg/dL — ABNORMAL HIGH (ref 6–23)
BUN: 69 mg/dL — ABNORMAL HIGH (ref 6–23)
CO2: 23 mEq/L (ref 19–32)
Calcium: 8.4 mg/dL (ref 8.4–10.5)
Calcium: 8.6 mg/dL (ref 8.4–10.5)
Calcium: 8.3 mg/dL — ABNORMAL LOW (ref 8.4–10.5)
Chloride: 103 mEq/L (ref 96–112)
Creatinine, Ser: 3.74 mg/dL — ABNORMAL HIGH (ref 0.4–1.2)
Creatinine, Ser: 3.92 mg/dL — ABNORMAL HIGH (ref 0.4–1.2)
Creatinine, Ser: 4.21 mg/dL — ABNORMAL HIGH (ref 0.4–1.2)
Creatinine, Ser: 3.12 mg/dL — ABNORMAL HIGH (ref 0.4–1.2)
GFR calc non Af Amer: 12 mL/min — ABNORMAL LOW (ref >60)
Glucose, Bld: 163 mg/dL — ABNORMAL HIGH (ref 70–99)
Glucose, Bld: 178 mg/dL — ABNORMAL HIGH (ref 70–99)
Potassium: 4.7 mEq/L (ref 3.5–5.1)
Potassium: 4.8 mEq/L (ref 3.5–5.1)
Potassium: 3.3 mEq/L — ABNORMAL LOW (ref 3.5–5.1)
Total Bilirubin: 5.2 mg/dL — ABNORMAL HIGH (ref 0.3–1.2)
Total Protein: 6.1 g/dL (ref 6.0–8.3)
Total Protein: 6.2 g/dL (ref 6.0–8.3)
Total Protein: 5.5 g/dL — ABNORMAL LOW (ref 6.0–8.3)

## 2010-03-25 LAB — RENAL FUNCTION PANEL
Albumin: 2.2 g/dL — ABNORMAL LOW (ref 3.5–5.2)
BUN: 64 mg/dL — ABNORMAL HIGH (ref 6–23)
BUN: 70 mg/dL — ABNORMAL HIGH (ref 6–23)
BUN: 53 mg/dL — ABNORMAL HIGH (ref 6–23)
CO2: 30 mEq/L (ref 19–32)
CO2: 24 mEq/L (ref 19–32)
Calcium: 8.3 mg/dL — ABNORMAL LOW (ref 8.4–10.5)
Chloride: 100 mEq/L (ref 96–112)
Chloride: 103 mEq/L (ref 96–112)
Chloride: 107 mEq/L (ref 96–112)
Creatinine, Ser: 2.9 mg/dL — ABNORMAL HIGH (ref 0.4–1.2)
Creatinine, Ser: 2.95 mg/dL — ABNORMAL HIGH (ref 0.4–1.2)
Creatinine, Ser: 2.55 mg/dL — ABNORMAL HIGH (ref 0.4–1.2)
GFR calc Af Amer: 19 mL/min — ABNORMAL LOW (ref >60)
GFR calc Af Amer: 22 mL/min — ABNORMAL LOW (ref >60)
GFR calc non Af Amer: 16 mL/min — ABNORMAL LOW (ref >60)
GFR calc non Af Amer: 18 mL/min — ABNORMAL LOW (ref >60)
Glucose, Bld: 95 mg/dL (ref 70–99)
Phosphorus: 4.5 mg/dL (ref 2.3–4.6)
Potassium: 3.5 mEq/L (ref 3.5–5.1)
Potassium: 3.6 mEq/L (ref 3.5–5.1)

## 2010-03-25 LAB — DIFFERENTIAL
Basophils Absolute: 0 10*3/uL (ref 0.0–0.1)
Basophils Relative: 0 % (ref 0–1)
Basophils Relative: 0 % (ref 0–1)
Basophils Relative: 0 % (ref 0–1)
Basophils Relative: 0 % (ref 0–1)
Basophils Relative: 0 % (ref 0–1)
Eosinophils Absolute: 0 10*3/uL (ref 0.0–0.7)
Eosinophils Absolute: 0 10*3/uL (ref 0.0–0.7)
Eosinophils Absolute: 0 10*3/uL (ref 0.0–0.7)
Eosinophils Absolute: 0.1 10*3/uL (ref 0.0–0.7)
Eosinophils Relative: 0 % (ref 0–5)
Eosinophils Relative: 0 % (ref 0–5)
Lymphocytes Relative: 2 % — ABNORMAL LOW (ref 12–46)
Lymphocytes Relative: 4 % — ABNORMAL LOW (ref 12–46)
Lymphs Abs: 0.5 10*3/uL — ABNORMAL LOW (ref 0.7–4.0)
Lymphs Abs: 0.7 10*3/uL (ref 0.7–4.0)
Lymphs Abs: 1.2 10*3/uL (ref 0.7–4.0)
Monocytes Absolute: 1.3 10*3/uL — ABNORMAL HIGH (ref 0.1–1.0)
Monocytes Absolute: 1.1 10*3/uL — ABNORMAL HIGH (ref 0.1–1.0)
Monocytes Relative: 10 % (ref 3–12)
Monocytes Relative: 3 % (ref 3–12)
Monocytes Relative: 8 % (ref 3–12)
Monocytes Relative: 9 % (ref 3–12)
Monocytes Relative: 13 % — ABNORMAL HIGH (ref 3–12)
Neutro Abs: 13.6 10*3/uL — ABNORMAL HIGH (ref 1.7–7.7)
Neutro Abs: 15.3 10*3/uL — ABNORMAL HIGH (ref 1.7–7.7)
Neutro Abs: 20.6 10*3/uL — ABNORMAL HIGH (ref 1.7–7.7)
Neutrophils Relative %: 87 % — ABNORMAL HIGH (ref 43–77)
Neutrophils Relative %: 88 % — ABNORMAL HIGH (ref 43–77)
Neutrophils Relative %: 90 % — ABNORMAL HIGH (ref 43–77)

## 2010-03-25 LAB — BASIC METABOLIC PANEL
BUN: 41 mg/dL — ABNORMAL HIGH (ref 6–23)
BUN: 55 mg/dL — ABNORMAL HIGH (ref 6–23)
BUN: 56 mg/dL — ABNORMAL HIGH (ref 6–23)
CO2: 23 mEq/L (ref 19–32)
CO2: 24 mEq/L (ref 19–32)
CO2: 26 mEq/L (ref 19–32)
CO2: 28 mEq/L (ref 19–32)
Calcium: 8.4 mg/dL (ref 8.4–10.5)
Calcium: 9.1 mg/dL (ref 8.4–10.5)
Calcium: 8.6 mg/dL (ref 8.4–10.5)
Chloride: 102 mEq/L (ref 96–112)
Creatinine, Ser: 2.44 mg/dL — ABNORMAL HIGH (ref 0.4–1.2)
Creatinine, Ser: 2.58 mg/dL — ABNORMAL HIGH (ref 0.4–1.2)
GFR calc non Af Amer: 15 mL/min — ABNORMAL LOW (ref >60)
Glucose, Bld: 130 mg/dL — ABNORMAL HIGH (ref 70–99)
Glucose, Bld: 192 mg/dL — ABNORMAL HIGH (ref 70–99)
Glucose, Bld: 141 mg/dL — ABNORMAL HIGH (ref 70–99)
Glucose, Bld: 89 mg/dL (ref 70–99)
Potassium: 4.2 mEq/L (ref 3.5–5.1)
Potassium: 3.7 mEq/L (ref 3.5–5.1)
Sodium: 142 mEq/L (ref 135–145)

## 2010-03-25 LAB — URINALYSIS, ROUTINE W REFLEX MICROSCOPIC
Bilirubin Urine: NEGATIVE
Nitrite: NEGATIVE
Protein, ur: >300 mg/dL — AB
Specific Gravity, Urine: 1.02 (ref 1.005–1.030)
Urobilinogen, UA: 0.2 mg/dL (ref 0.0–1.0)

## 2010-03-25 LAB — HEMOGLOBIN A1C: Mean Plasma Glucose: 128 mg/dL — ABNORMAL HIGH (ref <117)

## 2010-03-25 LAB — PROTIME-INR
INR: 1.22 (ref 0.00–1.49)
INR: 1.06 (ref 0.00–1.49)
Prothrombin Time: 15.6 seconds — ABNORMAL HIGH (ref 11.6–15.2)
Prothrombin Time: 19.1 seconds — ABNORMAL HIGH (ref 11.6–15.2)

## 2010-03-25 LAB — MRSA PCR SCREENING
MRSA by PCR: NEGATIVE
MRSA by PCR: NEGATIVE
MRSA by PCR: NEGATIVE

## 2010-03-25 LAB — URINE MICROSCOPIC-ADD ON

## 2010-03-25 LAB — AMYLASE: Amylase: 104 U/L (ref 0–105)

## 2010-03-25 LAB — BRAIN NATRIURETIC PEPTIDE: Pro B Natriuretic peptide (BNP): 818 pg/mL — ABNORMAL HIGH (ref 0.0–100.0)

## 2010-03-25 LAB — LIPID PANEL
Cholesterol: 148 mg/dL (ref 0–200)
HDL: 55 mg/dL (ref >39)
Triglycerides: 61 mg/dL (ref <150)

## 2010-03-25 LAB — LIPASE, BLOOD
Lipase: 20 U/L (ref 11–59)
Lipase: 27 U/L (ref 11–59)

## 2010-03-25 LAB — URINE CULTURE
Colony Count: >=100000
Culture  Setup Time: 201110222024

## 2010-03-25 LAB — PHOSPHORUS
Phosphorus: 5.3 mg/dL — ABNORMAL HIGH (ref 2.3–4.6)
Phosphorus: 5.5 mg/dL — ABNORMAL HIGH (ref 2.3–4.6)

## 2010-03-25 LAB — HEMOGLOBIN AND HEMATOCRIT, BLOOD: HCT: 29.2 % — ABNORMAL LOW (ref 36.0–46.0)

## 2010-03-25 LAB — T4, FREE: Free T4: 0.99 ng/dL (ref 0.80–1.80)

## 2010-03-25 LAB — BILIRUBIN, DIRECT: Bilirubin, Direct: 2.8 mg/dL — ABNORMAL HIGH (ref 0.0–0.3)

## 2010-03-26 LAB — RENAL FUNCTION PANEL
BUN: 46 mg/dL — ABNORMAL HIGH (ref 6–23)
Chloride: 108 mEq/L (ref 96–112)
Glucose, Bld: 79 mg/dL (ref 70–99)
Phosphorus: 3.6 mg/dL (ref 2.3–4.6)
Potassium: 4.4 mEq/L (ref 3.5–5.1)

## 2010-03-27 LAB — RENAL FUNCTION PANEL
Albumin: 3.2 g/dL — ABNORMAL LOW (ref 3.5–5.2)
Chloride: 108 mEq/L (ref 96–112)
GFR calc Af Amer: 19 mL/min — ABNORMAL LOW (ref >60)
GFR calc non Af Amer: 16 mL/min — ABNORMAL LOW (ref >60)
Potassium: 4.2 mEq/L (ref 3.5–5.1)
Sodium: 138 mEq/L (ref 135–145)

## 2010-03-27 LAB — HEMOGLOBIN AND HEMATOCRIT, BLOOD: HCT: 30.9 % — ABNORMAL LOW (ref 36.0–46.0)

## 2010-03-28 LAB — HEMOGLOBIN AND HEMATOCRIT, BLOOD
HCT: 31.4 % — ABNORMAL LOW (ref 36.0–46.0)
Hemoglobin: 10.3 g/dL — ABNORMAL LOW (ref 12.0–15.0)

## 2010-03-28 LAB — RENAL FUNCTION PANEL
Albumin: 3.1 g/dL — ABNORMAL LOW (ref 3.5–5.2)
Calcium: 8.9 mg/dL (ref 8.4–10.5)
Creatinine, Ser: 3.05 mg/dL — ABNORMAL HIGH (ref 0.4–1.2)
GFR calc Af Amer: 18 mL/min — ABNORMAL LOW (ref >60)
GFR calc non Af Amer: 15 mL/min — ABNORMAL LOW (ref >60)

## 2010-03-29 LAB — RENAL FUNCTION PANEL
Albumin: 3.4 g/dL — ABNORMAL LOW (ref 3.5–5.2)
BUN: 44 mg/dL — ABNORMAL HIGH (ref 6–23)
Creatinine, Ser: 2.67 mg/dL — ABNORMAL HIGH (ref 0.4–1.2)
Glucose, Bld: 116 mg/dL — ABNORMAL HIGH (ref 70–99)
Phosphorus: 5.5 mg/dL — ABNORMAL HIGH (ref 2.3–4.6)

## 2010-03-30 LAB — RENAL FUNCTION PANEL
CO2: 27 mEq/L (ref 19–32)
GFR calc Af Amer: 21 mL/min — ABNORMAL LOW (ref >60)
GFR calc non Af Amer: 17 mL/min — ABNORMAL LOW (ref >60)
Glucose, Bld: 106 mg/dL — ABNORMAL HIGH (ref 70–99)
Phosphorus: 4.9 mg/dL — ABNORMAL HIGH (ref 2.3–4.6)
Potassium: 4.7 mEq/L (ref 3.5–5.1)
Sodium: 143 mEq/L (ref 135–145)

## 2010-03-30 LAB — HEMOGLOBIN AND HEMATOCRIT, BLOOD: Hemoglobin: 10.3 g/dL — ABNORMAL LOW (ref 12.0–15.0)

## 2010-03-31 LAB — RENAL FUNCTION PANEL
Albumin: 3.2 g/dL — ABNORMAL LOW (ref 3.5–5.2)
BUN: 52 mg/dL — ABNORMAL HIGH (ref 6–23)
BUN: 53 mg/dL — ABNORMAL HIGH (ref 6–23)
CO2: 25 mEq/L (ref 19–32)
Calcium: 8.9 mg/dL (ref 8.4–10.5)
Chloride: 106 mEq/L (ref 96–112)
Creatinine, Ser: 2.86 mg/dL — ABNORMAL HIGH (ref 0.4–1.2)
Glucose, Bld: 104 mg/dL — ABNORMAL HIGH (ref 70–99)
Glucose, Bld: 229 mg/dL — ABNORMAL HIGH (ref 70–99)
Phosphorus: 4.1 mg/dL (ref 2.3–4.6)
Potassium: 3.9 mEq/L (ref 3.5–5.1)
Potassium: 4.1 mEq/L (ref 3.5–5.1)

## 2010-03-31 LAB — HEMOGLOBIN AND HEMATOCRIT, BLOOD
HCT: 30.8 % — ABNORMAL LOW (ref 36.0–46.0)
HCT: 30.9 % — ABNORMAL LOW (ref 36.0–46.0)
Hemoglobin: 10.5 g/dL — ABNORMAL LOW (ref 12.0–15.0)

## 2010-04-01 ENCOUNTER — Encounter: Payer: Self-pay | Admitting: Orthopedic Surgery

## 2010-04-01 LAB — RENAL FUNCTION PANEL
Albumin: 3.1 g/dL — ABNORMAL LOW (ref 3.5–5.2)
CO2: 25 mEq/L (ref 19–32)
Chloride: 108 mEq/L (ref 96–112)
Creatinine, Ser: 2.66 mg/dL — ABNORMAL HIGH (ref 0.4–1.2)
GFR calc Af Amer: 21 mL/min — ABNORMAL LOW (ref >60)
GFR calc non Af Amer: 17 mL/min — ABNORMAL LOW (ref >60)
Potassium: 3.9 mEq/L (ref 3.5–5.1)
Sodium: 141 mEq/L (ref 135–145)

## 2010-04-01 LAB — HEMOGLOBIN AND HEMATOCRIT, BLOOD: HCT: 31.9 % — ABNORMAL LOW (ref 36.0–46.0)

## 2010-04-02 ENCOUNTER — Ambulatory Visit: Payer: Medicaid Other | Admitting: Orthopedic Surgery

## 2010-04-03 LAB — RENAL FUNCTION PANEL
BUN: 42 mg/dL — ABNORMAL HIGH (ref 6–23)
CO2: 24 mEq/L (ref 19–32)
Calcium: 8.8 mg/dL (ref 8.4–10.5)
Glucose, Bld: 118 mg/dL — ABNORMAL HIGH (ref 70–99)
Phosphorus: 4.2 mg/dL (ref 2.3–4.6)

## 2010-04-03 LAB — HEMOGLOBIN AND HEMATOCRIT, BLOOD: Hemoglobin: 10.3 g/dL — ABNORMAL LOW (ref 12.0–15.0)

## 2010-04-15 LAB — COMPREHENSIVE METABOLIC PANEL
ALT: 30 U/L (ref 0–35)
AST: 33 U/L (ref 0–37)
Albumin: 3 g/dL — ABNORMAL LOW (ref 3.5–5.2)
Chloride: 104 mEq/L (ref 96–112)
Creatinine, Ser: 2.88 mg/dL — ABNORMAL HIGH (ref 0.4–1.2)
GFR calc Af Amer: 19 mL/min — ABNORMAL LOW (ref >60)
Sodium: 139 mEq/L (ref 135–145)
Total Bilirubin: 0.7 mg/dL (ref 0.3–1.2)

## 2010-04-15 LAB — DIFFERENTIAL
Basophils Absolute: 0 10*3/uL (ref 0.0–0.1)
Basophils Absolute: 0 10*3/uL (ref 0.0–0.1)
Basophils Absolute: 0 10*3/uL (ref 0.0–0.1)
Basophils Absolute: 0 10*3/uL (ref 0.0–0.1)
Basophils Absolute: 0 10*3/uL (ref 0.0–0.1)
Basophils Relative: 0 % (ref 0–1)
Eosinophils Absolute: 0 10*3/uL (ref 0.0–0.7)
Eosinophils Absolute: 0.1 10*3/uL (ref 0.0–0.7)
Eosinophils Absolute: 0.1 10*3/uL (ref 0.0–0.7)
Eosinophils Relative: 1 % (ref 0–5)
Eosinophils Relative: 1 % (ref 0–5)
Eosinophils Relative: 1 % (ref 0–5)
Lymphocytes Relative: 13 % (ref 12–46)
Lymphocytes Relative: 15 % (ref 12–46)
Lymphocytes Relative: 18 % (ref 12–46)
Lymphocytes Relative: 22 % (ref 12–46)
Lymphocytes Relative: 9 % — ABNORMAL LOW (ref 12–46)
Lymphs Abs: 0.8 10*3/uL (ref 0.7–4.0)
Lymphs Abs: 0.4 10*3/uL — ABNORMAL LOW (ref 0.7–4.0)
Lymphs Abs: 0.7 10*3/uL (ref 0.7–4.0)
Lymphs Abs: 1.1 10*3/uL (ref 0.7–4.0)
Monocytes Absolute: 0.6 10*3/uL (ref 0.1–1.0)
Monocytes Absolute: 0.6 10*3/uL (ref 0.1–1.0)
Monocytes Absolute: 0.5 10*3/uL (ref 0.1–1.0)
Monocytes Relative: 11 % (ref 3–12)
Monocytes Relative: 12 % (ref 3–12)
Monocytes Relative: 10 % (ref 3–12)
Monocytes Relative: 11 % (ref 3–12)
Monocytes Relative: 9 % (ref 3–12)
Neutro Abs: 3.8 10*3/uL (ref 1.7–7.7)
Neutro Abs: 3.5 10*3/uL (ref 1.7–7.7)
Neutro Abs: 3.7 10*3/uL (ref 1.7–7.7)
Neutro Abs: 3.9 10*3/uL (ref 1.7–7.7)
Neutro Abs: 4.3 10*3/uL (ref 1.7–7.7)
Neutrophils Relative %: 75 % (ref 43–77)
Neutrophils Relative %: 72 % (ref 43–77)
Neutrophils Relative %: 75 % (ref 43–77)
Neutrophils Relative %: 82 % — ABNORMAL HIGH (ref 43–77)

## 2010-04-15 LAB — BASIC METABOLIC PANEL
BUN: 30 mg/dL — ABNORMAL HIGH (ref 6–23)
BUN: 32 mg/dL — ABNORMAL HIGH (ref 6–23)
BUN: 42 mg/dL — ABNORMAL HIGH (ref 6–23)
BUN: 43 mg/dL — ABNORMAL HIGH (ref 6–23)
CO2: 32 mEq/L (ref 19–32)
CO2: 33 mEq/L — ABNORMAL HIGH (ref 19–32)
CO2: 32 mEq/L (ref 19–32)
Calcium: 8.9 mg/dL (ref 8.4–10.5)
Calcium: 8.4 mg/dL (ref 8.4–10.5)
Calcium: 8.6 mg/dL (ref 8.4–10.5)
Calcium: 8.6 mg/dL (ref 8.4–10.5)
Calcium: 8.7 mg/dL (ref 8.4–10.5)
Chloride: 101 mEq/L (ref 96–112)
Chloride: 103 mEq/L (ref 96–112)
Chloride: 98 mEq/L (ref 96–112)
Creatinine, Ser: 2.99 mg/dL — ABNORMAL HIGH (ref 0.4–1.2)
Creatinine, Ser: 2.48 mg/dL — ABNORMAL HIGH (ref 0.4–1.2)
Creatinine, Ser: 2.83 mg/dL — ABNORMAL HIGH (ref 0.4–1.2)
Creatinine, Ser: 2.83 mg/dL — ABNORMAL HIGH (ref 0.4–1.2)
GFR calc Af Amer: 18 mL/min — ABNORMAL LOW (ref >60)
GFR calc Af Amer: 18 mL/min — ABNORMAL LOW (ref >60)
GFR calc Af Amer: 19 mL/min — ABNORMAL LOW (ref >60)
GFR calc non Af Amer: 16 mL/min — ABNORMAL LOW (ref >60)
GFR calc non Af Amer: 16 mL/min — ABNORMAL LOW (ref >60)
GFR calc non Af Amer: 17 mL/min — ABNORMAL LOW (ref >60)
GFR calc non Af Amer: 19 mL/min — ABNORMAL LOW (ref >60)
Glucose, Bld: 121 mg/dL — ABNORMAL HIGH (ref 70–99)
Glucose, Bld: 101 mg/dL — ABNORMAL HIGH (ref 70–99)
Glucose, Bld: 142 mg/dL — ABNORMAL HIGH (ref 70–99)
Glucose, Bld: 282 mg/dL — ABNORMAL HIGH (ref 70–99)
Glucose, Bld: 76 mg/dL (ref 70–99)
Potassium: 4.1 mEq/L (ref 3.5–5.1)
Potassium: 3.8 mEq/L (ref 3.5–5.1)
Potassium: 3.9 mEq/L (ref 3.5–5.1)
Potassium: 3.9 mEq/L (ref 3.5–5.1)
Sodium: 140 mEq/L (ref 135–145)
Sodium: 140 mEq/L (ref 135–145)
Sodium: 139 mEq/L (ref 135–145)
Sodium: 140 mEq/L (ref 135–145)

## 2010-04-15 LAB — TSH: TSH: 1.805 u[IU]/mL (ref 0.350–4.500)

## 2010-04-15 LAB — GLUCOSE, CAPILLARY
Glucose-Capillary: 100 mg/dL — ABNORMAL HIGH (ref 70–99)
Glucose-Capillary: 158 mg/dL — ABNORMAL HIGH (ref 70–99)
Glucose-Capillary: 192 mg/dL — ABNORMAL HIGH (ref 70–99)
Glucose-Capillary: 206 mg/dL — ABNORMAL HIGH (ref 70–99)
Glucose-Capillary: 256 mg/dL — ABNORMAL HIGH (ref 70–99)
Glucose-Capillary: 273 mg/dL — ABNORMAL HIGH (ref 70–99)
Glucose-Capillary: 122 mg/dL — ABNORMAL HIGH (ref 70–99)
Glucose-Capillary: 124 mg/dL — ABNORMAL HIGH (ref 70–99)
Glucose-Capillary: 137 mg/dL — ABNORMAL HIGH (ref 70–99)
Glucose-Capillary: 155 mg/dL — ABNORMAL HIGH (ref 70–99)
Glucose-Capillary: 170 mg/dL — ABNORMAL HIGH (ref 70–99)
Glucose-Capillary: 170 mg/dL — ABNORMAL HIGH (ref 70–99)
Glucose-Capillary: 215 mg/dL — ABNORMAL HIGH (ref 70–99)
Glucose-Capillary: 60 mg/dL — ABNORMAL LOW (ref 70–99)
Glucose-Capillary: 91 mg/dL (ref 70–99)
Glucose-Capillary: 97 mg/dL (ref 70–99)

## 2010-04-15 LAB — POCT CARDIAC MARKERS
CKMB, poc: 1.2 ng/mL (ref 1.0–8.0)
CKMB, poc: 3 ng/mL (ref 1.0–8.0)
Myoglobin, poc: 133 ng/mL (ref 12–200)
Myoglobin, poc: 229 ng/mL (ref 12–200)
Troponin i, poc: <0.05 ng/mL (ref 0.00–0.09)

## 2010-04-15 LAB — CBC
HCT: 32.1 % — ABNORMAL LOW (ref 36.0–46.0)
HCT: 24.5 % — ABNORMAL LOW (ref 36.0–46.0)
HCT: 27.7 % — ABNORMAL LOW (ref 36.0–46.0)
HCT: 29.8 % — ABNORMAL LOW (ref 36.0–46.0)
Hemoglobin: 10.7 g/dL — ABNORMAL LOW (ref 12.0–15.0)
Hemoglobin: 9.4 g/dL — ABNORMAL LOW (ref 12.0–15.0)
Hemoglobin: 9.7 g/dL — ABNORMAL LOW (ref 12.0–15.0)
MCHC: 32.7 g/dL (ref 30.0–36.0)
MCHC: 33.2 g/dL (ref 30.0–36.0)
MCHC: 32.6 g/dL (ref 30.0–36.0)
MCV: 75.9 fL — ABNORMAL LOW (ref 78.0–100.0)
MCV: 76.6 fL — ABNORMAL LOW (ref 78.0–100.0)
Platelets: 139 10*3/uL — ABNORMAL LOW (ref 150–400)
Platelets: 158 10*3/uL (ref 150–400)
Platelets: 163 10*3/uL (ref 150–400)
Platelets: 165 10*3/uL (ref 150–400)
RBC: 3.77 MIL/uL — ABNORMAL LOW (ref 3.87–5.11)
RBC: 4.23 MIL/uL (ref 3.87–5.11)
RBC: 4.01 MIL/uL (ref 3.87–5.11)
RDW: 15.9 % — ABNORMAL HIGH (ref 11.5–15.5)
RDW: 15.3 % (ref 11.5–15.5)
RDW: 15.7 % — ABNORMAL HIGH (ref 11.5–15.5)
RDW: 15.7 % — ABNORMAL HIGH (ref 11.5–15.5)
RDW: 15.8 % — ABNORMAL HIGH (ref 11.5–15.5)
WBC: 5.7 10*3/uL (ref 4.0–10.5)
WBC: 4.5 10*3/uL (ref 4.0–10.5)
WBC: 4.9 10*3/uL (ref 4.0–10.5)
WBC: 5.3 10*3/uL (ref 4.0–10.5)
WBC: 6 10*3/uL (ref 4.0–10.5)

## 2010-04-15 LAB — LIPID PANEL
Total CHOL/HDL Ratio: 4.1 RATIO
Triglycerides: 136 mg/dL (ref <150)
VLDL: 27 mg/dL (ref 0–40)

## 2010-04-15 LAB — ABO/RH: ABO/RH(D): A POS

## 2010-04-15 LAB — BLOOD GAS, ARTERIAL
Acid-Base Excess: 4.8 mmol/L — ABNORMAL HIGH (ref 0.0–2.0)
FIO2: 1.5 %
O2 Content: 1.5 L/min
TCO2: 28.3 mmol/L (ref 0–100)
pCO2 arterial: 49.4 mmHg — ABNORMAL HIGH (ref 35.0–45.0)
pH, Arterial: 7.393 (ref 7.350–7.400)
pO2, Arterial: 99.4 mmHg (ref 80.0–100.0)

## 2010-04-15 LAB — BRAIN NATRIURETIC PEPTIDE: Pro B Natriuretic peptide (BNP): 1070 pg/mL — ABNORMAL HIGH (ref 0.0–100.0)

## 2010-04-15 LAB — IRON AND TIBC
Iron: 70 ug/dL (ref 42–135)
Saturation Ratios: 29 % (ref 20–55)
TIBC: 240 ug/dL — ABNORMAL LOW (ref 250–470)
UIBC: 170 ug/dL

## 2010-04-15 LAB — FERRITIN: Ferritin: 96 ng/mL (ref 10–291)

## 2010-04-15 LAB — CARDIAC PANEL(CRET KIN+CKTOT+MB+TROPI)
CK, MB: 2.2 ng/mL (ref 0.3–4.0)
CK, MB: 2.6 ng/mL (ref 0.3–4.0)
Relative Index: INVALID (ref 0.0–2.5)
Total CK: 85 U/L (ref 7–177)
Troponin I: 0.03 ng/mL (ref 0.00–0.06)

## 2010-04-15 LAB — TYPE AND SCREEN: Antibody Screen: NEGATIVE

## 2010-04-15 LAB — MAGNESIUM: Magnesium: 1.9 mg/dL (ref 1.5–2.5)

## 2010-04-15 LAB — HEMOGLOBIN A1C: Mean Plasma Glucose: 120 mg/dL

## 2010-04-30 ENCOUNTER — Emergency Department (HOSPITAL_COMMUNITY): Payer: Medicare Other

## 2010-04-30 ENCOUNTER — Inpatient Hospital Stay (HOSPITAL_COMMUNITY)
Admission: EM | Admit: 2010-04-30 | Discharge: 2010-05-01 | DRG: 101 | Disposition: A | Discharge status: Home / Self Care | Payer: Medicare Other | Attending: Internal Medicine | Admitting: Internal Medicine

## 2010-04-30 DIAGNOSIS — E119 Type 2 diabetes mellitus without complications: Secondary | ICD-10-CM | POA: Diagnosis present

## 2010-04-30 DIAGNOSIS — Z66 Do not resuscitate: Secondary | ICD-10-CM | POA: Diagnosis present

## 2010-04-30 DIAGNOSIS — I129 Hypertensive chronic kidney disease with stage 1 through stage 4 chronic kidney disease, or unspecified chronic kidney disease: Secondary | ICD-10-CM | POA: Diagnosis present

## 2010-04-30 DIAGNOSIS — G40209 Localization-related (focal) (partial) symptomatic epilepsy and epileptic syndromes with complex partial seizures, not intractable, without status epilepticus: Principal | ICD-10-CM | POA: Diagnosis present

## 2010-04-30 DIAGNOSIS — D509 Iron deficiency anemia, unspecified: Secondary | ICD-10-CM | POA: Diagnosis present

## 2010-04-30 DIAGNOSIS — M1990 Unspecified osteoarthritis, unspecified site: Secondary | ICD-10-CM | POA: Diagnosis present

## 2010-04-30 DIAGNOSIS — N184 Chronic kidney disease, stage 4 (severe): Secondary | ICD-10-CM | POA: Diagnosis present

## 2010-04-30 LAB — DIFFERENTIAL
Basophils Absolute: 0 10*3/uL (ref 0.0–0.1)
Basophils Relative: 0 % (ref 0–1)
Eosinophils Absolute: 0.1 10*3/uL (ref 0.0–0.7)
Lymphocytes Relative: 20 % (ref 12–46)
Lymphs Abs: 1.2 10*3/uL (ref 0.7–4.0)
Monocytes Absolute: 0.5 10*3/uL (ref 0.1–1.0)
Neutro Abs: 4.1 10*3/uL (ref 1.7–7.7)

## 2010-04-30 LAB — GLUCOSE, CAPILLARY: Glucose-Capillary: 134 mg/dL — ABNORMAL HIGH (ref 70–99)

## 2010-04-30 LAB — COMPREHENSIVE METABOLIC PANEL
AST: 16 U/L (ref 0–37)
CO2: 25 mEq/L (ref 19–32)
Calcium: 8.5 mg/dL (ref 8.4–10.5)
Creatinine, Ser: 2.77 mg/dL — ABNORMAL HIGH (ref 0.4–1.2)
GFR calc Af Amer: 20 mL/min — ABNORMAL LOW (ref >60)
GFR calc non Af Amer: 16 mL/min — ABNORMAL LOW (ref >60)
Total Protein: 6.2 g/dL (ref 6.0–8.3)

## 2010-04-30 LAB — CBC
Hemoglobin: 7.8 g/dL — ABNORMAL LOW (ref 12.0–15.0)
MCH: 23 pg — ABNORMAL LOW (ref 26.0–34.0)
MCHC: 31.8 g/dL (ref 30.0–36.0)
RDW: 18.4 % — ABNORMAL HIGH (ref 11.5–15.5)

## 2010-05-01 ENCOUNTER — Inpatient Hospital Stay (HOSPITAL_COMMUNITY): Payer: Medicare Other

## 2010-05-01 ENCOUNTER — Inpatient Hospital Stay (HOSPITAL_COMMUNITY)
Admit: 2010-05-01 | Discharge: 2010-05-01 | Disposition: A | Discharge status: Home / Self Care | Payer: Medicare Other | Attending: Emergency Medicine | Admitting: Emergency Medicine

## 2010-05-01 LAB — CROSSMATCH
ABO/RH(D): A POS
Unit division: 0

## 2010-05-01 LAB — HEMOGLOBIN A1C
Hgb A1c MFr Bld: 6.6 % — ABNORMAL HIGH (ref <5.7)
Mean Plasma Glucose: 143 mg/dL — ABNORMAL HIGH (ref <117)

## 2010-05-01 LAB — DIFFERENTIAL
Basophils Absolute: 0 10*3/uL (ref 0.0–0.1)
Basophils Relative: 0 % (ref 0–1)
Eosinophils Absolute: 0 10*3/uL (ref 0.0–0.7)
Eosinophils Relative: 0 % (ref 0–5)

## 2010-05-01 LAB — BASIC METABOLIC PANEL
BUN: 50 mg/dL — ABNORMAL HIGH (ref 6–23)
GFR calc Af Amer: 22 mL/min — ABNORMAL LOW (ref >60)
GFR calc non Af Amer: 18 mL/min — ABNORMAL LOW (ref >60)
Potassium: 4.4 mEq/L (ref 3.5–5.1)
Sodium: 139 mEq/L (ref 135–145)

## 2010-05-01 LAB — VITAMIN B12: Vitamin B-12: 736 pg/mL (ref 211–911)

## 2010-05-01 LAB — CBC
Platelets: 152 10*3/uL (ref 150–400)
RDW: 18.4 % — ABNORMAL HIGH (ref 11.5–15.5)
WBC: 7.3 10*3/uL (ref 4.0–10.5)

## 2010-05-01 LAB — MAGNESIUM: Magnesium: 2.1 mg/dL (ref 1.5–2.5)

## 2010-05-01 LAB — GLUCOSE, CAPILLARY: Glucose-Capillary: 89 mg/dL (ref 70–99)

## 2010-05-01 LAB — IRON AND TIBC: Iron: 198 ug/dL — ABNORMAL HIGH (ref 42–135)

## 2010-05-02 NOTE — Procedures (Signed)
NAME:  [name], [name]              ACCOUNT NO.:  [id number]  MEDICAL RECORD NO.:  [id number]           PATIENT TYPE:  I  LOCATION:  A315                          FACILITY:  [facility]  PHYSICIAN:  Harshitha Fretz A. Merlene Laughter, M.D. DATE OF BIRTH:  1925/08/12  DATE OF PROCEDURE: DATE OF DISCHARGE:  05/01/2010                             EEG INTERPRETATION   REFERRING PHYSICIAN:  [facility].  INDICATIONS:  A 77 year old who presents with episodes of unresponsiveness and confusion.  The study has been done to evaluate for seizures.  She apparently had eyes rolled back during these events.  MEDICATIONS:  Insulin, Coreg, Protonix, isosorbide, Zofran, Tylenol.  ANALYSIS:  A 16-channel recording using standard 10/20 system is obtained for 21 minutes.  There is a well-formed posterior dominant rhythm of 9 Hz which attenuates eye opening.  There is beta activity observed in frontal areas.  Awake and sleep activities are recorded. Stage II sleep with K complexes and sleep spindles are observed.  There is no focal or lateralized slowing.  There is no epileptiform activity observed.  IMPRESSION:  Normal recording of awake and asleep states.     [name] A. Merlene Laughter, M.D.     KAD/MEDQ  D:  05/02/2010  T:  05/02/2010  Job:  [id number]

## 2010-05-02 NOTE — Discharge Summary (Signed)
NAME:  [name], [name]              ACCOUNT NO.:  [id number]  MEDICAL RECORD NO.:  [id number]           PATIENT TYPE:  I  LOCATION:  A315                          FACILITY:  [facility]  PHYSICIAN:  Doree Albee, M.D.DATE OF BIRTH:  August 15, 1925  DATE OF ADMISSION:  04/30/2010 DATE OF DISCHARGE:  04/20/2012LH                              DISCHARGE SUMMARY   FINAL DISCHARGE DIAGNOSES: 1. 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 2. Chronic kidney disease stage IV. 3. Chronic anemia, microcytic status post 2 units of blood     transfusion. 4. Diabetes type 2. 5. Hypertension. 6. Osteoarthritis.  CONDITION ON DISCHARGE:  Stable.  MEDICATIONS ON DISCHARGE:  The patient will continue her home medications which include Lasix, Tylenol, gabapentin, citalopram, carvedilol, amlodipine, hydralazine, simvastatin. NovoLog 70/30, isosorbide 3 times a day, aspirin, and Nexium.  HISTORY:  This very pleasant 77 year old lady came into the hospital yesterday with a 5-10 minute episodes of staring while not being responsive to things around her.  Please see initial history and physical examination done by Dr. Megan Salon.  HOSPITAL PROGRESS:  When I questioned the patient today, she gives a slightly different history saying that the episode was 5-10 minutes where the history given yesterday was more or like 1-2 minutes.  I suspect the 1-2 minutes is more accurate.  The patient was staring into space.  She was staring into space in a sitting position.  She did not "lose consciousness" or fall from her sitting position.  She apparently then went to the bathroom and felt perfectly fine and has been her usual self since this time.  When she came to the emergency room, she was noted to have a hemoglobin of 7.8.  She has been transfused 2 units overnight.  On physical examination, her temperature is 98, blood pressure 156/61, pulse 61, saturation 99% on 2 liters oxygen.  She looks well.  She is not in any respiratory  distress.  Cardiovascular examination, heart sounds are present and normal without murmurs.  Lung fields are entirely clear.  Neurologically, she is alert and oriented without any focal neurologic signs whatsoever.  Investigations today show sodium of 139, potassium 4.4, bicarbonate 26, BUN 50, creatinine 2.58 which is more or less around her baseline, hemoglobin 9.6, status post 2 units of blood transfusion, white blood cell count 7.3, platelets 152,000.  The MCV is reduced representing microcytic picture.  She does have rouleaux formation.  She also has very few schistocytes.  She does not look septic.  She did have a CT scan of her brain yesterday and this showed atrophy and minimal small vessel disease with chronic ischemic changes but no acute stroke or any other changes.  DISPOSITION:  I think the patient is stable to be discharged with an episode of what could represent complex partial seizure.  I think this needs to be further evaluated as an outpatient and we will make an appointment for her to see Dr. Merlene Laughter, the local neurologist here in [place].  I have told her that if she has any further of these episodes, she must come back to the hospital.  She is very keen to go  home today.     Doree Albee, M.D.     NCG/MEDQ  D:  05/01/2010  T:  05/01/2010  Job:  [id number]  cc:   Alison Murray, M.D. Fax: [place] Merlene Laughter, M.D. [phone]  Electronically Signed by Hurshel Party M.D. on 05/02/2010 12:49:35 PM

## 2010-05-05 LAB — PROTEIN ELECTROPHORESIS, SERUM
Albumin ELP: 51.7 % — ABNORMAL LOW (ref 55.8–66.1)
Alpha-1-Globulin: 5.3 % — ABNORMAL HIGH (ref 2.9–4.9)
Alpha-2-Globulin: 10.7 % (ref 7.1–11.8)

## 2010-05-05 NOTE — H&P (Signed)
NAME:  [name], [name] NO.:  [id number]  MEDICAL RECORD NO.:  [id number]           PATIENT TYPE:  I  LOCATION:  A315                          FACILITY:  [facility]  PHYSICIAN:  Karlyn Agee, M.D. DATE OF BIRTH:  Mar 14, 1925  DATE OF ADMISSION:  04/30/2010 DATE OF DISCHARGE:  LH                             HISTORY & PHYSICAL   PRIMARY CARE PHYSICIAN:  Dr. Jeanne Ivan in [place], [place].  NEPHROLOGIST:  Alison Murray, MD  DICTATING PHYSICIAN:  Karlyn Agee, MD  CHIEF COMPLAINT:  Acute staring episode.  HISTORY OF PRESENT ILLNESS:  This is an 77 year old African American lady with multiple medical problems including diabetes, hypertension, stage IV chronic kidney disease, who has no known history of seizure and who after eating breakfast in the presence of her caregiver apparently had 1-2 minutes staring episode where she was unresponsive, stared out in space, but did not lose consciousness.  The patient had no shaking or tremors.  No nausea, vomiting, diaphoresis, or diarrhea.  She did not lose bladder control.  The patient became alert again but said she felt somewhat sick and took a nap.  Subsequently family were informed of the episode and they then informed caregiver that patient had had a similar episode a few months ago, so decision was taken to bring the patient to the emergency room for further evaluation.  In the emergency room, the patient was noted to have anemia below her baseline and the hospitalist service was called for further management.  Of note, the patient complains of chronically black stool because of iron use and is being prepared for dialysis but has not yet had a shunt placed.  PAST MEDICAL HISTORY: 1. Diabetes type 2. 2. Hypertension. 3. Stage IV chronic kidney disease. 4. .Recent urinary tract infection associated with delirium. 5. Gout. 6. Diastolic heart failure. 7. Chronic anemia. 8. Status post cholecystitis  and cholangitis in 2011. 9. History of stroke with no residual deficit. 10.Episodic sinus bradycardia. 11.Hyperlipidemia. 12.History of cancer of the uterus status post hysterectomy. 13.Depression. 14.Morbid obesity. 15.Gout and degenerative joint disease. 16.Bilateral cataracts. 17.Glaucoma.  MEDICATIONS:  The patient's medication reconciliation is not available at this time.  However, at discharge from this facility in February, her medications included Lasix, Tylenol, gabapentin citalopram, carvedilol, guanfacine, amlodipine, hydralazine, simvastatin, NovoLog 70/30, Poly iron, isosorbide dinitrate 3 times daily, aspirin, Nexium.  ALLERGIES:  No known drug allergies.  SOCIAL HISTORY:  No history of tobacco, alcohol, or illicit drug use.  FAMILY HISTORY:  Reviewed and unchanged from previous admissions.  REVIEW OF SYSTEMS:  Significant for recent recurrent swelling of right lower extremity more so for the past week, significant for chronic difficulty hearing.  She ambulates with assistance of a walker.  PHYSICAL EXAMINATION:  GENERAL:  A very pleasant elderly African American lady lying flat in the stretcher in no acute distress.  She is alert and oriented. VITALS:  Temperature is 98.8, respirations 18, pulse 64, blood pressure 168/62.  She is saturating at 95% on room air. HEENT:  Her pupils are round and equal.  Mucous membranes pale. Anicteric. NECK:  No cervical lymphadenopathy.  No thyromegaly.  No carotid bruit. CHEST:  Clear to auscultation bilaterally. CARDIOVASCULAR SYSTEM:  Regular rhythm.  No murmur. ABDOMEN:  Obese, soft, nontender. EXTREMITIES:  She has 1+ edema of the right lower extremity, soft pitting, there is no tenderness.  She has arthritic deformities of the hands, knees, and ankles. CENTRAL NERVOUS SYSTEM:  She is mildly hard of hearing but cranial nerves III to XII are grossly intact.  She has weakness of both lower extremities but has no focal  lateralizing signs.  LABORATORY DATA:  Her white count is 5.9, hemoglobin 7.8 drifting down gradually from 12, a few months ago.  MCV is 72.3, platelets 152,000. The film shows polychromasia, schistocytes, and rouleaux.  Her sodium is 140, potassium 4.8, chloride 108, CO2 25, glucose 131, BUN 51, creatinine 2.77.  Her albumin is 3.1, calcium 8.5.  Chest x-ray shows cardiomegaly without pulmonary edema and CT scan of the brain shows atrophy with minimal small vessel chronic ischemic changes with deep cerebral white matter.  No acute intracranial abnormalities.  Minimal sinus disease changes.  ASSESSMENT: 1. Recurrent staring episodes likely related to complex partial     seizures. 2. Acute on chronic anemia. 3. Stage IV chronic kidney disease. 4. Diabetes type 2. 5. Hypertension. 6. Osteoarthritis. 7. Acute swelling of the right lower extremity. 8. Multiple chronic medical problems as noted above.  PLAN: 1. We will admit this lady for transfusion for anemia and transfusion     is already started.  I think although she does not have a marked     elevation in globulin albumin ratio, I think it is worthwhile to     attempt to get a serum protein electrophoresis to rule out multiple     myeloma in this lady because of the rouleaux present on her     peripheral smear.  It may also be worthwhile to get an EEG of the     brain as an inpatient or outpatient to assess for seizure disorder,     any neuro abnormality causing seizures may be aggravated by anemia.     Consideration could be given to an MRI of the brain. 2. We will get medication reconciliation and continue treatment of her     chronic medical problems. 3. Hypoglycemia could cause symptoms described above.  However, the     patient by report had just eaten prior to the onset of this     disorder.  Nevertheless we will keep her on a sensitive sliding     scale for the next day. 4. Other plans as per orders.     Karlyn Agee, M.D.     LC/MEDQ  D:  04/30/2010  T:  04/30/2010  Job:  [id number]  cc:   Sherryle Lis, M.D. [id number]  Electronically Signed by Karlyn Agee M.D. on 05/05/2010 06:33:13 PM

## 2010-05-15 ENCOUNTER — Encounter (INDEPENDENT_AMBULATORY_CARE_PROVIDER_SITE_OTHER): Payer: Medicare Other

## 2010-05-15 ENCOUNTER — Ambulatory Visit (INDEPENDENT_AMBULATORY_CARE_PROVIDER_SITE_OTHER): Payer: Medicare Other | Admitting: Vascular Surgery

## 2010-05-15 DIAGNOSIS — N184 Chronic kidney disease, stage 4 (severe): Secondary | ICD-10-CM

## 2010-05-15 DIAGNOSIS — N186 End stage renal disease: Secondary | ICD-10-CM

## 2010-05-15 DIAGNOSIS — Z01810 Encounter for preprocedural cardiovascular examination: Secondary | ICD-10-CM

## 2010-05-18 ENCOUNTER — Encounter (HOSPITAL_COMMUNITY)
Admission: RE | Admit: 2010-05-18 | Discharge: 2010-05-18 | Disposition: A | Discharge status: Home / Self Care | Payer: Medicare Other | Source: Ambulatory Visit | Attending: Vascular Surgery | Admitting: Vascular Surgery

## 2010-05-18 LAB — BASIC METABOLIC PANEL
BUN: 56 mg/dL — ABNORMAL HIGH (ref 6–23)
CO2: 26 mEq/L (ref 19–32)
Chloride: 107 mEq/L (ref 96–112)
Creatinine, Ser: 2.55 mg/dL — ABNORMAL HIGH (ref 0.4–1.2)
Glucose, Bld: 148 mg/dL — ABNORMAL HIGH (ref 70–99)
Potassium: 4.5 mEq/L (ref 3.5–5.1)

## 2010-05-18 LAB — SURGICAL PCR SCREEN: Staphylococcus aureus: NEGATIVE

## 2010-05-18 LAB — CBC
HCT: 26.3 % — ABNORMAL LOW (ref 36.0–46.0)
Hemoglobin: 8.4 g/dL — ABNORMAL LOW (ref 12.0–15.0)
MCH: 23.7 pg — ABNORMAL LOW (ref 26.0–34.0)
MCV: 74.3 fL — ABNORMAL LOW (ref 78.0–100.0)
RBC: 3.54 MIL/uL — ABNORMAL LOW (ref 3.87–5.11)
WBC: 7 10*3/uL (ref 4.0–10.5)

## 2010-05-18 NOTE — Consult Note (Signed)
NEW PATIENT CONSULTATION  [name], [name] DOB:  07/02/1925                                       05/15/2010 [id number]  REASON FOR CONSULTATION:  Placement of access.  HISTORY OF PRESENT ILLNESS:  This is an 77 year old female who is, by report, chronic kidney disease stage 5 in need of dialysis relatively soon.  She presents for evaluation today.  She is right-hand dominant. Denies any previous accesses, denies any previous central lines.  This history, please note,  was somewhat limited due to the patient's limited memory.  Family members also were not able to fully elicit further details.  PAST MEDICAL HISTORY:  COPD requiring oxygen use, chronic kidney disease stage 5, history of anemia, congestive heart failure, hypertension, diabetes, uterine cancer, stroke with residual right-sided weakness, gastroesophageal reflux disease, some type of cardiac arrhythmia and osteoarthritis.  PAST SURGICAL HISTORY:  Some type of gynecologic surgery.  SOCIAL HISTORY:  Denies tobacco, alcohol or illicit drug use.  FAMILY HISTORY:  In regards to mother and father, she cannot remember what problems they had.  MEDICATIONS:  Aspirin, Lasix, hydralazine, isosorbide, carvedilol, Nexium, Celexa, Poly Iron, Norvasc, guaifenesin, Neurontin, Zocor, NovoLog, Trovan.  ALLERGIES:  She had been no known drug allergies.  REVIEW OF SYSTEMS:  Today she noted weight gain, blackouts, headaches, change in hearing, pain in legs with walking, pain in legs when lying flat, stroke, mini stroke, slurred speech, phlebitis, home oxygen, wheezing, arthritis, joint pain, muscle pain, shortness of breath when lying flat, shortness breath with exertion, anemia, depression, black stools, kidney disease, frequent urination.  Otherwise the rest review of systems was listed as negative.  PHYSICAL EXAMINATION:  Vital signs:  She had a blood pressure 149/67, heart rate 61, respirations were  20, satting 99% on 2 liters of oxygen which she is carrying with her. General:  Alert and oriented x3, well-developed, well-nourished, quite obese. Head: Normocephalic, atraumatic. ENT:  Hearing is grossly decreased, she had problems hearing me.  Nares without any erythema or drainage.  Oropharynx with erythema without exudate. Eye:  Her pupils were equal, round, and reactive to light.  Extraocular movements were intact. Neck:  Supple neck.  No nuchal rigidity.  Palpable cervical lymphadenopathy.  No JVD. Pulmonary:  Symmetric expansion, rales throughout, no rhonchi or wheezing. Cardiac:  Regular rate and rhythm.  Normal S1 and S2.  No murmurs, rubs, thrills or gallops. Vascular:  Easily palpable radial pulses bilaterally, brachial pulses and carotids were palpable.  No bruit.  Abdominal aorta cannot be palpated due to her obesity.  Femorals were palpable but I did not appreciate any pedal pulses in this patient. GI:  Obese, soft, nontender, nondistended.  No guarding or rebound, no hepatosplenomegaly. Musculoskeletal:  Symmetric strength, 5/5 strength throughout.  No obvious ischemic changes in any extremity. Neurologic:  Cranial nerves II-XII were intact.  Motor as listed above. Sensation grossly intact. Psychiatric:  Judgment intact.  Mood and affect were appropriate for her clinical situation. Skin:  Extremities as listed above, otherwise I did not appreciate any rashes or any type of ulcers elsewhere. Lymphatics:  No axillary, or inguinal lymphadenopathy.  Palpable submental lymphadenopathy.  Noninvasive vascular imaging:  The bilateral upper extremities were mapped.  There are good veins throughout the right arm.  In her nondominant arm, the left arm, the forearm basilic and cephalic veins appeared to be appropriately  sized. It looks like distally the cephalic vein on the left side it is not quite adequate for use.  Also I reviewed about 15 pages of outside  documentation.  MEDICAL DECISION MAKING:  This is an 77 year old patient chronic kidney disease, stage 5, who is now in need of dialysis within the next few months.  I would proceed forward with placement of a left brachiocephalic arteriovenous fistula in this patient.  She also needs to be enrolled with some type of dialysis education program as at this point she is not completely aware of what she is getting into, in regards to hemodialysis.  We discussed the nature of dialysis surgery, the uncertainty of it and the need for multiple interventions frequently.  She is aware the risks of this procedure include bleeding, infection and possible steal syndrome, possible ischemic monomeric neuropathy, possible nerve damage, possible failure to mature and possible need for additional procedures.  She is aware of such. Tentatively I will have her scheduled for this coming Wednesday to expedite this as it will take 6-8 months for this to appropriately mature for use.  We appreciate being given the opportunity to participate in this patient's care.    Conrad Powers, MD Electronically Signed  BLC/MEDQ  D:  05/15/2010  T:  05/18/2010  Job:  [id number]  cc:   Alison Murray, M.D.

## 2010-05-19 ENCOUNTER — Ambulatory Visit (HOSPITAL_COMMUNITY)
Admission: RE | Admit: 2010-05-19 | Discharge: 2010-05-19 | Disposition: A | Discharge status: Home / Self Care | Payer: Medicare Other | Source: Ambulatory Visit | Attending: Vascular Surgery | Admitting: Vascular Surgery

## 2010-05-19 DIAGNOSIS — Z8542 Personal history of malignant neoplasm of other parts of uterus: Secondary | ICD-10-CM | POA: Insufficient documentation

## 2010-05-19 DIAGNOSIS — I69998 Other sequelae following unspecified cerebrovascular disease: Secondary | ICD-10-CM | POA: Insufficient documentation

## 2010-05-19 DIAGNOSIS — I120 Hypertensive chronic kidney disease with stage 5 chronic kidney disease or end stage renal disease: Secondary | ICD-10-CM | POA: Insufficient documentation

## 2010-05-19 DIAGNOSIS — Z79899 Other long term (current) drug therapy: Secondary | ICD-10-CM | POA: Insufficient documentation

## 2010-05-19 DIAGNOSIS — Z01812 Encounter for preprocedural laboratory examination: Secondary | ICD-10-CM | POA: Insufficient documentation

## 2010-05-19 DIAGNOSIS — E119 Type 2 diabetes mellitus without complications: Secondary | ICD-10-CM | POA: Insufficient documentation

## 2010-05-19 DIAGNOSIS — N185 Chronic kidney disease, stage 5: Secondary | ICD-10-CM | POA: Insufficient documentation

## 2010-05-19 DIAGNOSIS — R29898 Other symptoms and signs involving the musculoskeletal system: Secondary | ICD-10-CM | POA: Insufficient documentation

## 2010-05-19 DIAGNOSIS — J4489 Other specified chronic obstructive pulmonary disease: Secondary | ICD-10-CM | POA: Insufficient documentation

## 2010-05-19 DIAGNOSIS — N186 End stage renal disease: Secondary | ICD-10-CM

## 2010-05-19 DIAGNOSIS — M1990 Unspecified osteoarthritis, unspecified site: Secondary | ICD-10-CM | POA: Insufficient documentation

## 2010-05-19 DIAGNOSIS — J449 Chronic obstructive pulmonary disease, unspecified: Secondary | ICD-10-CM | POA: Insufficient documentation

## 2010-05-19 HISTORY — PX: AV FISTULA PLACEMENT, BRACHIOCEPHALIC: SHX1207

## 2010-05-19 LAB — GLUCOSE, CAPILLARY
Glucose-Capillary: 49 mg/dL — ABNORMAL LOW (ref 70–99)
Glucose-Capillary: 62 mg/dL — ABNORMAL LOW (ref 70–99)
Glucose-Capillary: 94 mg/dL (ref 70–99)
Glucose-Capillary: 113 mg/dL — ABNORMAL HIGH (ref 70–99)

## 2010-05-20 NOTE — Op Note (Signed)
NAME:  [name], COLLEDGE NO.:  [id number]  MEDICAL RECORD NO.:  [id number]           PATIENT TYPE:  O  LOCATION:  SDSC                         FACILITY:  [facility]  PHYSICIAN:  Conrad Auberry, MD       DATE OF BIRTH:  1925-10-10  DATE OF PROCEDURE: DATE OF DISCHARGE:  05/19/2010                              OPERATIVE REPORT   PROCEDURE:  Left Brachiocephalic arteriovenous fistula placement.  PREOPERATIVE DIAGNOSIS:  Chronic kidney disease, stage V.  POSTOPERATIVE DIAGNOSIS:  Chronic kidney disease, stage V.  SURGEON:  Aaron Edelman L. Bridgett Larsson, MD  ASSISTANT:  Evorn Gong, PA  ANESTHESIA:  General.  Findings included palpable left radial pulse and palpable thrill at the end of the case.  SPECIMENS:  None.  ESTIMATED BLOOD LOSS:  Minimal.  INDICATIONS:  This is an 77 year old female who is now approaching end- stage renal disease, currently chronic kidney disease stage V.  Her nephrologist requested placement of a fistula to facilitate ventral hemodialysis.  The patient is aware of the risks of the procedure include but are not limited to: bleeding, infection, possible steal syndrome, possible nerve damage, possible ischemic monomeric neuropathy, possible failure to mature, and possible need for additional procedures. She is aware of these risks and agreed to proceed forward.  DESCRIPTION OF OPERATION:  After full informed written consent was obtained from the patient, she was brought back to the operating room, placed supine upon the operating table.  Prior to induction, she received IV antibiotics, and then after obtaining adequate anesthesia, she was then prepped and draped in the standard fashion for a left arm access procedure.  I turned my attention to her left antecubitum and I made a transverse incision over the brachial artery.  Using blunt dissection and electrocautery, I developed a plane down to brachial artery.  It was noted be pulsatile and soft,  almost 4-5 mm externally in diameter. Then, I dissected the subcutaneous tissue laterally and found a large cubital vein connected to the cephalic vein system.  This was dissected out proximally and distally and then dissected it onto the forearm area and then clamped it and then transected the vein.  There was good venous backbleeding and clamped this vein and then tied off the distal portion of this cubital vein with a 2-0 silk tie, then O interrogated this vein. It was noted to be at least 4 mm in diameter and I passed the 4-mm dilator without any problems.  At this point, I dissected it out more proximally and instilled it with heparinized saline and clamped it.  I then reset my exposure of the brachial artery.  I placed vessel loops around the brachial artery proximally and distally and placed the brachial artery under tension proximally and distally in the vessel loops.  I made am arteriotomy, extended it with Potts scissor for about a 4-mm arteriotomy.  The vein was then sewn to the artery in an end-to-side fashion using a running stitch of 7-0 Prolene.  Prior to completing this anastomosis, I backbled the vein, there was good venous back bleeding, and then allowed the artery to bleed both  in the antegrade and retrograde fashion.  There was good pulsatile bleeding in both directions.  The anastomosis was completed in usual fashion.  I then released all clamps and vessel loops.  Immediately, the vein distended. There was good thrill immediately in this vein.  At this point, I interrogated the patient's left wrist.  There was a palpable radial pulse and had a biphasic signal.  I also interrogated the brachial artery proximally and distally, which demonstrated multiphasic signal  proximal and distal to the anastomosis.  There was also a flow signature  in the venous outflow consistent with widely patent arteriovenous fistula.  The anastomosis was under a little bit of tension so I  examined the lay of this fistula.  There was one side branch in the vein that was tethering this fistula so I dissected it out and tied it off proximally and distally with a 3-0 silk.  After transecting this, I was able to mobilize the fistula more medially.  This released all of the tension on this fistula.  There was a strong thrill and an outflow at this point.  I irrigated out the wound.  There was no more active bleeding at this point.  The subcutaneous tissue was then reapproximated with a running stitch of 3-0 Vicryl.  The skin was then reapproximated with a running subcuticular 4-0 Monocryl.  The skin was then cleaned, dried, and reinforced with Dermabond.  The patient was allowed to awaken without any problems.  COMPLICATIONS:  None.  CONDITION:  Stable.     Conrad Waukeenah, MD     BLC/MEDQ  D:  05/19/2010  T:  05/20/2010  Job:  [id number]  Electronically Signed by Adele Barthel MD on 05/20/2010 06:41:15 PM

## 2010-05-22 NOTE — Procedures (Unsigned)
CEPHALIC VEIN MAPPING  INDICATION:  Preoperative vein mapping.  HISTORY:  EXAM: The right cephalic vein is compressible.  Diameter measurements range from 0.30 to 0.48 cm.  The right basilic vein is compressible.  Diameter measurements range from 0.35 to 0.48 cm.  The left cephalic vein is compressible.  Diameter measurements range from 0.26 to 0.62 cm.  The left basilic vein is compressible.  Diameter measurements range from 0.29 to 0.54 cm.  See attached worksheet for all measurements.  IMPRESSION:  Patent bilateral cephalic and basilic veins with diameter measurements as described above.  ___________________________________________ Conrad Sandstone, MD  CH/MEDQ  D:  05/15/2010  T:  05/15/2010  Job:  [id number]

## 2010-05-26 ENCOUNTER — Emergency Department (HOSPITAL_COMMUNITY): Payer: Medicare Other

## 2010-05-26 ENCOUNTER — Inpatient Hospital Stay (HOSPITAL_COMMUNITY)
Admission: AD | Admit: 2010-05-26 | Discharge: 2010-05-30 | DRG: 683 | Disposition: A | Discharge status: Home Health | Payer: Medicare Other | Source: Ambulatory Visit | Attending: Otolaryngology | Admitting: Otolaryngology

## 2010-05-26 DIAGNOSIS — B961 Klebsiella pneumoniae [K. pneumoniae] as the cause of diseases classified elsewhere: Secondary | ICD-10-CM | POA: Diagnosis present

## 2010-05-26 DIAGNOSIS — D126 Benign neoplasm of colon, unspecified: Secondary | ICD-10-CM | POA: Diagnosis present

## 2010-05-26 DIAGNOSIS — N184 Chronic kidney disease, stage 4 (severe): Secondary | ICD-10-CM | POA: Diagnosis present

## 2010-05-26 DIAGNOSIS — I129 Hypertensive chronic kidney disease with stage 1 through stage 4 chronic kidney disease, or unspecified chronic kidney disease: Secondary | ICD-10-CM | POA: Diagnosis present

## 2010-05-26 DIAGNOSIS — N390 Urinary tract infection, site not specified: Secondary | ICD-10-CM | POA: Diagnosis present

## 2010-05-26 DIAGNOSIS — T448X5A Adverse effect of centrally-acting and adrenergic-neuron-blocking agents, initial encounter: Secondary | ICD-10-CM | POA: Diagnosis present

## 2010-05-26 DIAGNOSIS — D631 Anemia in chronic kidney disease: Secondary | ICD-10-CM | POA: Diagnosis present

## 2010-05-26 DIAGNOSIS — N179 Acute kidney failure, unspecified: Principal | ICD-10-CM | POA: Diagnosis present

## 2010-05-26 DIAGNOSIS — R5381 Other malaise: Secondary | ICD-10-CM | POA: Diagnosis present

## 2010-05-26 DIAGNOSIS — E119 Type 2 diabetes mellitus without complications: Secondary | ICD-10-CM | POA: Diagnosis present

## 2010-05-26 DIAGNOSIS — N039 Chronic nephritic syndrome with unspecified morphologic changes: Secondary | ICD-10-CM | POA: Diagnosis present

## 2010-05-26 DIAGNOSIS — I498 Other specified cardiac arrhythmias: Secondary | ICD-10-CM | POA: Diagnosis present

## 2010-05-26 DIAGNOSIS — M19019 Primary osteoarthritis, unspecified shoulder: Secondary | ICD-10-CM | POA: Diagnosis present

## 2010-05-26 LAB — COMPREHENSIVE METABOLIC PANEL
BUN: 73 mg/dL — ABNORMAL HIGH (ref 6–23)
CO2: 26 mEq/L (ref 19–32)
Calcium: 9.1 mg/dL (ref 8.4–10.5)
Creatinine, Ser: 3.38 mg/dL — ABNORMAL HIGH (ref 0.4–1.2)
GFR calc non Af Amer: 13 mL/min — ABNORMAL LOW (ref >60)
Glucose, Bld: 177 mg/dL — ABNORMAL HIGH (ref 70–99)

## 2010-05-26 LAB — DIFFERENTIAL
Basophils Absolute: 0 10*3/uL (ref 0.0–0.1)
Eosinophils Relative: 0 % (ref 0–5)
Lymphocytes Relative: 20 % (ref 12–46)
Monocytes Relative: 9 % (ref 3–12)
Neutro Abs: 4.9 10*3/uL (ref 1.7–7.7)

## 2010-05-26 LAB — CBC
HCT: 22.5 % — ABNORMAL LOW (ref 36.0–46.0)
Hemoglobin: 7.2 g/dL — ABNORMAL LOW (ref 12.0–15.0)
MCH: 24.3 pg — ABNORMAL LOW (ref 26.0–34.0)
RBC: 2.96 MIL/uL — ABNORMAL LOW (ref 3.87–5.11)

## 2010-05-26 LAB — URINALYSIS, ROUTINE W REFLEX MICROSCOPIC
Glucose, UA: NEGATIVE mg/dL
Ketones, ur: NEGATIVE mg/dL
Protein, ur: 100 mg/dL — AB
Urobilinogen, UA: 0.2 mg/dL (ref 0.0–1.0)

## 2010-05-26 LAB — URINE MICROSCOPIC-ADD ON

## 2010-05-26 LAB — PREPARE RBC (CROSSMATCH)

## 2010-05-26 LAB — PROTIME-INR: Prothrombin Time: 14.7 seconds (ref 11.6–15.2)

## 2010-05-26 NOTE — Consult Note (Signed)
NAME:  [name], [name]              ACCOUNT NO.:  [id number]   MEDICAL RECORD NO.:  [id number]          PATIENT TYPE:  INP   LOCATION:  A326                          FACILITY:  [facility]   PHYSICIAN:  Gaston Islam. Neijstrom, MD  DATE OF BIRTH:  20-May-1925   DATE OF CONSULTATION:  06/13/2007  DATE OF DISCHARGE:                                 CONSULTATION   ADMITTING DIAGNOSES:  1. Anemia.  2. Renal insufficiency with a BUN of 95 and creatinine 3.09 as of June 13, 2007.  Her hemoglobin on the same date was 10.2 with an MCV of      73.4.  She has also had a TSH which is normal on Jun 11, 2007.  A      hemoglobin A1c of 7.0 on Jun 11, 2007.  A PT/PTT that were normal      on Jun 11, 2007, and urinalysis which grew greater than 100,000      colonies of E. coli.  3. History of uterine cancer many years ago with resection by Dr.      Irving Shows.  4. Diabetes mellitus.  5. Hypertension.  6. History of stroke in the past.  7. Depression on citalopram.  8. Renal insufficiency with acute on chronic disease.   This is a pleasant 77 year old lady who states that she has been anemic  in the past and had been given some shots by Dr. Deirdre Pippins office in the  past though she cannot remember the name.   She is very interesting, however, and that she has a family history of a  son who in his mid 75s died of metastatic colon cancer, may have had a  brother in his 75s or 70s who died of colon cancer potentially though  that is not absolute.   The daughter-in-law saw me in the hallway and under fear of getting her  mother-in-law upset thinks that her other son may have colon cancer as  well at this juncture and with her own personal history of uterine  cancer, there is a possibility that she has genetic tendency or trait  that is in the family line.  Nevertheless, she has 5 sisters still  living none with colon cancer or uterine cancer she is aware of, and 2  brothers she had, both are deceased, and 1 was  killed in the car wreck  she states.   She does not know why her parents are deceased, father was 75 and mother  was 42.   She is not a smoker, not a drinker, never has been.   She has been a diabetic for a number of years.   She is a widow at this time.  She did lose a child of 66 months of age  due to childhood illness, sounds like.  Her only other son was in his  mid 55s when he died of metastatic colon cancer as mentioned above.  She  has 8 children still living.   She has been feeling weak and tired for about a week to two weeks prior  to  this admission.   She was brought here to the hospital on the date mentioned above.  Her  highest temperature that I can find was 102.7 degrees on June 12, 2007.   Her temperatures have come down since then.   She does not have a sore tongue, does not crave ice, never has.  She  does have some arthritis primarily to the right knee, she states.  She  is also hard of hearing, but left her hearing aid from her left ear at  home.  She has a mammography, she states, every year.   PHYSICAL EXAMINATION:  GENERAL:  She is a very alert and oriented.  VITAL SIGNS:  She has a blood pressure of 122/80, respirations 18 and  unlabored.  She is afebrile presently.  Pulse right around 56-60 and  fairly regular.  SKIN:  Warm and dry to touch.  HEENT:  Her teeth were in fair repair at best.  Tongue was unremarkable,  however.  EOMs appeared intact.  CARDIAC:  I did detect a grade [id number] systolic ejection murmur, soft.  I  did not detect any S3 gallop.  BREASTS:  Negative for any masses.  She had no obvious adenopathy in any  location.  LUNGS:  Clear at this time.  ABDOMEN:  She had no obvious hepatosplenomegaly.  EXTREMITIES:  She does have swelling of the right joint consistent with  DJD.  The left was unremarkable and she had difficulty bending the right  leg at the knee.  She had an upper extremity range of motion which was  symmetrical.  Pulses in  her upper extremities were unremarkable, but  symmetrical.  Her posterior tibialis pulses were 1+.  Dorsalis pedis  pulses were very difficult to feel.  NEUROLOGIC:  I felt her facial symmetry was fairly intact.  I did not  detect changes of normal stroke at this time.   I think the patient's labs, which do show this renal insufficiency and  this microcytic anemia, most likely consistent with anemia chronic  disease complicated not only by diabetes and the acute infection  worsening chronic renal disease, but the good news is I do not think she  needs any acute therapy for this at this time.   In looking back in January this year, hemoglobin was 10.6 as well, when  she was checked here at that time.  MCV is almost identical to what it  was then as compared to now.   I think we will check an anemia panel, but again I do not think there is  a need for therapy and Dr. Lowanda Foster has also seen her and we will  follow her along, but I do think this family history of uterine cancer  in her and possibly colon cancer in 2 of her children and possibly in a  brother needs further elucidation.      Gaston Islam. Tressie Stalker, MD  Electronically Signed     ESN/MEDQ  D:  06/13/2007  T:  06/14/2007  Job:  [id number]   cc:   Anselmo Pickler, DO   Nat Christen, M.D.   Alison Murray, M.D.  Fax: [phone]

## 2010-05-26 NOTE — Consult Note (Signed)
NAME:  [name], [name]              ACCOUNT NO.:  [id number]   MEDICAL RECORD NO.:  [id number]          PATIENT TYPE:  INP   LOCATION:  A326                          FACILITY:  [facility]   PHYSICIAN:  Alison Murray, M.D.DATE OF BIRTH:  February 17, 1925   DATE OF CONSULTATION:  06/10/2007  DATE OF DISCHARGE:                                 CONSULTATION   REASON FOR CONSULTATION:  Renal insufficiency.   HISTORY OF PRESENT ILLNESS:  Ms. Naeve is a 77 year old who is known  to me, who has a history of hypertension, diabetes, chronic renal  failure, baseline creatine between 1.7 and 2, presently came with right-  sided hip pain.  Since the patient's BUN and creatinine is elevated,  consult is called.  At this moment, she denies diarrhea, nausea, or  vomiting; however, her appetite seems to be poor.  According to the  patient, she has a history of fall about 2-3 weeks and during that time,  she came to the emergency room.  X-ray was done and the patient was  found not to have any fracture; however, since then she continued to  have problem with her hip.  Hence, she decided to come to the hospital.  At this moment, she denies any nausea.  No vomiting.  Appetite is  reasonable now.   PAST MEDICAL HISTORY:  1. She has a history of diabetes.  2. History of hypertension.  3. History of CVA.  4. History of chronic renal failure, possibly stage III.  5. History of neuropathy.  6. History of also some osteoarthritis.  7. History of also CHF.   MEDICATIONS:  Consist of:  1. Aspirin 81 mg p.o. daily.  2. She is on Lasix 80 mg p.o. daily.  3. Metolazone 5 mg p.o. daily.  4. Novolin insulin 35 units in the morning and 50 units in the      evening.  5. Gabapentin 80 mg p.o. daily.  6. Amlodipine 10 mg p.o. daily.  7. Lisinopril 40 mg p.o. daily.   FAMILY HISTORY:  She has a history of hypertension, but no history of  renal failure.   SOCIAL HISTORY:  No history of strong smoking.  No history of  alcohol  abuse.   REVIEW OF SYSTEMS:  She denies any nausea or vomiting.  Appetite is not  good.  She denies any diarrhea.  No urgency.  No frequency.  She does  not have any cough.  No chest pain.  No shortness of breath.  Her leg  swelling has improved.   PHYSICAL EXAMINATION:  GENERAL:  The patient is alert.  No complaint  except right-sided hip pain.  VITAL SIGNS:  Her blood pressure which was taken in the emergency room;  her systolic blood pressure is [id number] and diastolic 50; pulse of 52,  respiratory rate is 14, and temperature is 98.5.  CHEST:  Clear to auscultation.  No rales.  No rhonchi.  No egophony.  HEART:  Regular rate and rhythm.  No murmur.  ABDOMEN:  Soft.  Positive bowel sounds.  EXTREMITIES:  She has trace edema.   LABORATORY DATA:  Her blood work; her white blood cell count is 11.1,  hemoglobin 11.3, and hematocrit 32.9.  Her sodium is 140, potassium is  3, BUN is 101, and creatinine is 2.82.   ASSESSMENT:  1. Renal insufficiency, chronic, usually stage III.  Presently, her      BUN and creatinine increased probably from diuresis as she has      significant anasarca and she was on metolazone and Lasix.  2. Hypokalemia, related to the diuretics.  3. Anemia, mild, etiology not clear.  4. Diabetes.  She is on insulin.  5. History of hypertension.  Blood pressure seems to be reasonably      controlled.  6. History of cerebrovascular accident.  7. History of neuropathy.  8. History of degenerative joint disease.  9. History of uterine cyst status post hysterectomy long time ago.   RECOMMENDATIONS:  We will give her some potassium orally.  We will also  hydrate her very slowly.  We will continue her other medications and we  will follow the patient.      Alison Murray, M.D.  Electronically Signed     BB/MEDQ  D:  06/10/2007  T:  06/11/2007  Job:  [id number]

## 2010-05-26 NOTE — Group Therapy Note (Signed)
NAME:  [name], [name]              ACCOUNT NO.:  [id number]   MEDICAL RECORD NO.:  [id number]          PATIENT TYPE:  INP   LOCATION:  A326                          FACILITY:  [facility]   PHYSICIAN:  Audria Nine, M.D.DATE OF BIRTH:  1925-11-05   DATE OF PROCEDURE:  06/12/2007  DATE OF DISCHARGE:                                 PROGRESS NOTE   SUBJECTIVE:  The patient was admitted with right hip pain and also some  fatigue and dehydration and acute renal insufficiency.  The patient has  also received diuretics recently.  However, her urine test this morning  is showing a urinary tract infection.   OBJECTIVE:  GENERAL:  The patient was conscious, alert, comfortable, not  in acute distress.  VITAL SIGNS:  Blood pressure was 135/67 with a pulse of 56, respirations  20, T-max was 97.8 degrees Fahrenheit.  Blood sugars are ranging between  199-278.  HEENT:  Normocephalic, atraumatic.  Oral mucosa was dry.  No exudates  were noted.  NECK:  Supple.  No JVD or lymphadenopathy.  LUNGS:  Reduced air entry bilaterally.  No crackles, wheezing or  rhonchi.  HEART:  S1-S2 regular.  No S3, S4, gallops or rubs.  ABDOMEN:  Soft, nontender.  Bowel sounds positive.  No masses palpable.  EXTREMITIES:  Trace pretibial edema.   LABORATORY DATA:  White blood cell count 10.8, hemoglobin of 10.5,  hematocrit 31.1, platelet count was 190.  Sodium 140, potassium 4.2,  chloride 101, CO2 of 31, glucose 183, BUN of 91, creatinine was 2.86,  AST of 36, ALT of 13, albumin 2.5.  Urinalysis showed many bacteria with  21-50 WBCs.  Urine culture results are still pending.   ASSESSMENT/PLAN:  1. Intractable hip pain.  2. Acute on chronic renal insufficiency, likely secondary to      aggressive diuresis.  The patient's BUN and creatinine has slightly      improved.  3. Urinary tract infection.  4. Chronic anemia.  5. Diabetes type 2.  6. Hypokalemia, which has been corrected.   PLAN:  1. X-rays of hip and  lumbar spine only revealed evidence of      degenerative joint disease with no abnormalities found.  2. Her renal ultrasound was unremarkable.  3. Urinary tract infection, I will initiate treatment with Levaquin.  4. The patient will need some physical therapy.  She does ambulate      with a walker at home.  I will try to continue monitor.  5. Acute on chronic back pain.  She thinks this was exacerbated by the      hospital bed.   DISPOSITION:  I think we will continue IV fluid hydration on her at this  time.  Will recheck her BUN and creatinine in the morning.      Audria Nine, M.D.  Electronically Signed     AM/MEDQ  D:  06/12/2007  T:  06/12/2007  Job:  [id number]

## 2010-05-26 NOTE — H&P (Signed)
NAME:  [name], [name] NO.:  [id number]   MEDICAL RECORD NO.:  [id number]          PATIENT TYPE:  EMS   LOCATION:  ED                            FACILITY:  [facility]   PHYSICIAN:  Anselmo Pickler, DO    DATE OF BIRTH:  1925/09/08   DATE OF ADMISSION:  06/10/2007  DATE OF DISCHARGE:  LH                              HISTORY & PHYSICAL   The patient is a 77 year old African female who came in with a chief  complaint of hip pain.  She stated about 3 weeks ago she was seen in the  ED and had fallen on her right side.  Her hip and arm have been the  problem since then.  She has also been following her primary care  physician on this and was started on gabapentin for nerve pain.  The  patient states that it is better than it was before but it is still  present.  Also, while she was evaluated in the ED she was found to be in  acute renal failure so she is being admitted to our service for that.   PAST MEDICAL HISTORY:  Significant for:  1. Hypertension.  2. Diabetes.  3. Cancer of the uterus.  4. Stroke.   FAMILY HISTORY:  Positive for hypertension.   Currently, she lives alone.  She is a nonsmoker, nondrinker.  No drug  abuse.  She has no allergies to any medications.   CURRENT MEDICATION LIST:  1. Aspirin 81 mg once a day.  2. Lisinopril 40 mg once a day.  3. Amlodipine 10 mg once a day.  4. Furosemide 80 mg once a day.  5. Gabapentin 600 mg once a day.  6. She is on Novolin 35 units in the a.m., 50 units in the p.m.  7. Citalopram 20 mg p.o. daily.  8. Metolazone 5 mg p.o. daily.  9. Guanfacine 2 mg p.o. daily.   REVIEW OF SYSTEMS:  Negative for fever, weight loss or chills.  Positive  appetite change.  EYES:  Negative for eye pain or visual changes.  EARS,  NOSE, MOUTH AND THROAT:  Negative for ear pain, tinnitus, nasal  congestion or sore throat.  CARDIOVASCULAR:  Negative for chest pain,  dyspnea.  RESPIRATORY:  Negative for cough, dyspnea or wheezing.  GASTROINTESTINAL:  Negative for nausea, vomiting, abdominal pain,  constipation or diarrhea.  GU:  Negative for dysuria or flank pain.  MUSCULOSKELETAL:  Positive for arthralgias and myalgias on the right  side.  SKIN:  Negative for pruritus or rash. NEURO:  Negative for  headache, weakness or dizziness.  PSYCHIATRIC:  Negative for anxiety or  depression.  METABOLIC:  Negative for thirst or cold intolerance.   PHYSICAL EXAMINATION:  Her blood pressure is 137/50, pulse rate 52,  respirations 14, temperature 98.5, saturating at 95%.  GENERAL:  The patient is a 77 year old African American woman who is  well-developed, well-nourished, in no acute distress.  HEAD:  Head and face are normocephalic atraumatic.  Eyes:  EOMI.  PERLA.  Mouth:  Teeth are in poor repair.  TMIs are visualized bilaterally.  Nasal mucosa:  Turbinates are moist.  NECK:  Supple.  Full range of motion.  No thyromegaly or lymphadenopathy  noted.  CARDIOVASCULAR:  Regular rate and rhythm.  No gallop, rub or murmur.  RESPIRATORY:  Clear to auscultation bilaterally.  No rales, wheezes or  rhonchi.  ABDOMEN:  Soft, nontender, nondistended.  No masses or rebound  tenderness.  Bowel sounds are present all four quadrants.  EXTREMITIES:  Positive pulses.  No ecchymosis, cyanosis or edema.  The  patient is also complaining of right hip pain.  She has full range of  motion other than tenderness on palpation.   LABORATORIES:  Sodium 140, potassium 3.0, chloride 98, carbon dioxide  31, glucose 226, BUN 101, creatinine 6.82.  CBC:  White count of 11.1,  hemoglobin of 11.3, hematocrit of 32.8, platelet count of 191.   ASSESSMENT:  1. Acute renal failure.  2. Dehydration.  3. Right-sided pain.  4. Anemia.  5. Hypokalemia.  6. Diabetes.   PLAN:  Will admit the patient to the service of [facility] to 3A, the  general medical floor.  Will place a Foley and place her on a  carbohydrate-modified diet for diabetes.  Also, will start  IV hydration  at 125 mL per hour normal saline.  Will obtain labs and a renal  ultrasound.  Will place the patient on home medications.  Will stop her  lisinopril and Lasix, and her metolazone 5 mg p.o. daily.  Will also  request nephrology to see the patient as well.  Will replace her  potassium and continue to monitor her.      Anselmo Pickler, DO  Electronically Signed     CB/MEDQ  D:  06/10/2007  T:  06/10/2007  Job:  [phone] [id number]

## 2010-05-26 NOTE — Group Therapy Note (Signed)
NAME:  [name], LUNDIN NO.:  [id number]   MEDICAL RECORD NO.:  [id number]          PATIENT TYPE:  INP   LOCATION:  A326                          FACILITY:  [facility]   PHYSICIAN:  Girard Cooter, MD         DATE OF BIRTH:  1925-04-15   DATE OF PROCEDURE:  DATE OF DISCHARGE:                                 PROGRESS NOTE   The patient examined by bedside.  Full history of physical examination  reviewed.  The patient is a 77 year old [place] American female, came in  with right-sided hip pain, I guess has been ongoing now for 3 weeks.  She presented to the emergency room 3 weeks prior.  She was given  gabapentin for some neuropathic pain.  In the emergency room she was  found to be dehydrated with acute renal failure.  Nephrology was  consulted and it was their impression that it was likely chronic renal  insufficiency, probably acute exacerbation from over-diuresis and from  the metolazone and Lasix.  Currently, she is still in an uncontrolled  and excessive amount of pain.   Temperature 97.4, pulse 46, respiration 18, blood pressure 101/51.  HEENT:  Normocephalic, atraumatic.  Note, mucous membranes are somewhat  dry.  CARDIOVASCULAR:  S1, S2.  Regular rate and rhythm.  No murmurs, rubs,  clicks.  LUNGS:  Clear to auscultation bilaterally.  ABDOMEN:  Soft, nontender, nondistended, positive bowel sounds.  EXTREMITIES:  No clubbing, cyanosis or edema.   LABORATORY DATA:  Complete metabolic panel significant for a BUN of 97,  creatinine 2.88, glucose of 160.  Note that prior basic metabolic panel  shows a BUN of 101, creatinine 2.82.  Beta natriuretic peptide 675.  INR  of 1.1.  CBC shows hemoglobin 10.9, hematocrit of 31.8, MCV low at 72,  and a white count of 10.3.   ASSESSMENT:  1. Intractable hip pain.  2. Admitted with dehydration/acute-on-chronic renal failure/volume      contraction, likely secondary to diuresis.  3. Chronic anemia that is microcytic.  4.  Diabetes type 2, seems to be uncontrolled.  5. Hypokalemia that is currently resolved.   PLAN:  I am going to go ahead and x-ray bilateral hips and lumbosacral  spine.  Although a CT scan of the lower extremity versus MRI would be a  better choice, her renal function prohibits that currently.  Will get a  total CK level to rule out any muscle breakdown, which would also  correlate with worsening renal function.  Will continue plan as far as  her worsening kidney function and holding her Lasix, metolazone and  continue per nephrology recommendations strict I's and O's, daily  weights, IV fluids in anticipation of bettering of her renal function.  Her current care is ongoing.  Will continue pain management.  Continue  DVT and GI prophylaxis.  As far as her anemia, which is microcytic in  nature, she will need stool for occult blood.  In addition, if it is  positive, she will need a GI  consultation.  Note that she has had an EGD on Jun 01, 2005, which  showed squamous papilloma, noncritical Schatzki ring.   Current care is ongoing.  The rest of the plans are dependant on her  progress and results.      Girard Cooter, MD  Electronically Signed     RR/MEDQ  D:  06/11/2007  T:  06/11/2007  Job:  [id number]

## 2010-05-26 NOTE — Discharge Summary (Signed)
NAME:  [name], [name]              ACCOUNT NO.:  [id number]   MEDICAL RECORD NO.:  [id number]          PATIENT TYPE:  INP   LOCATION:  A326                          FACILITY:  [facility]   PHYSICIAN:  Audria Nine, M.D.DATE OF BIRTH:  04-04-1925   DATE OF ADMISSION:  06/10/2007  DATE OF DISCHARGE:  06/04/2009LH                               DISCHARGE SUMMARY   DISCHARGE DIAGNOSES:  1. Severe hip pain secondary to degenerative joint disease.  2. Acute renal insufficiency, with a BUN of 95, creatinine of 3.09,      possibly secondary to aggressive diuresis.  3. History of congestive heart failure.  4. History of stage IV renal disease.  5. History of uterine cancer, status post resection by Dr. Irving Shows.  6. History of diabetes mellitus.  7. Escherichia coli urinary tract infection.  8. Hypokalemia, resolved.  9. Dehydration, likely secondary to diuresis, also resolved.   DISCHARGE MEDICATIONS:  1. Ceftriaxone 1 g IV q.24 h. for 5 days only.  2. Norvasc 10 mg p.o. once a day.  3. Neurontin 600 mg p.o. daily.  4. Tenex 2 mg p.o. daily.  5. Insulin sliding scale per nursing home protocol.  6. Novolin insulin 20 units in the morning, and 10 units in the      evening.  7. Citalopram 20 mg p.o. once a day.  8. Protonix 40 mg p.o. once a day.  9. Lopressor 25 mg p.o. daily.  10.Nu-Iron 150 mg p.o. once a day.  11.Albuterol 2.5 mg nebulizers q.4 h. p.r.n.  12.Hydrocodone/APAP 1 tablet p.o. q. 4 h. p.r.n. for severe pain.  13.Ambien 5 mg p.o. at bedtime p.r.n. for sleep.  14.Lisinopril 40 mg p.o. once a day.   Please note that the patient's diuretic remains on hold at this time.   CONSULTATIONS OBTAINED:  1. Dr. Lowanda Foster, nephrology, due to acute renal insufficiency.  2. Dr. Tressie Stalker, due to history of uterine cancer..   ACTIVITY:  The patient he will be having rehab and physical therapy at  the nursing home.   DIET:  Heart-healthy, 1800 ADA, consistent  carbohydrate.   FOLLOWUP:  1. The patient is scheduled to follow up with primary care physician      and also as arranged by the nursing home.  2. The patient is to follow up with Dr. Lowanda Foster.   DISPOSITION:  The patient has been transferred to [facility]  today.   HOSPITAL COURSE:  [name] is a 77 year old female who was admitted  to the hospital complaining of hip pain.  The patient apparently had  been seen in the emergency room 3 weeks prior and apparently had fallen  on her right side.  The patient says she had been having severe pain.  She has seeing her primary care physician, who started her on some  gabapentin for the nerve pain.  However, the patient reports that this  has not improved.  When she was seen in the emergency room, the patient  was noted to have a severely elevated BUN and creatinine.  It is noted  that her BUN back in January 2009 was only 10, with a creatinine of 1,  but the patient came in with a BUN as high as 95, and a creatinine of  6.82.  As a result, the patient was admitted for hydration and  evaluation of her acute renal insufficiency.  The patient denies use of  over-the-counter nonsteroidal anti-inflammatory drugs.   The patient was started on normal saline and was followed by nephrology  who has known her.  She does have a history of poor compliance.   X-rays were done of the hips, which only showed degenerative joint  disease.   Overall, the patient continues to make progress, but has significant  difficulty ambulating.  Her urine was also noted to be infected.  As a  result, the patient was started on Levaquin.  However, it emerged that  she grew Escherichia coli in her urine which was resistant to Levaquin.  The patient was then switched to ceftriaxone.  On June 15, 2007, the  patient was seen.  She was doing much better.  Her blood sugar was under  control.  She had no fever, had no evidence of sepsis.  Her BUN and  creatinine  had improved to the best number since she was admitted.  After discussions with Dr. Lowanda Foster, it appears that he suspect that the  patient is probably going to need dialysis soon.  He has discussed this  with her.  He will follow her as an outpatient.   PERTINENT LABORATORY DATA:  On admission, white blood cell count was  9.5, hemoglobin of 10.7, hematocrit 31.6, platelet count was 196, with  no left shift.  Sodium 136, potassium 3.4, chloride of 95, CO2 was 30,  glucose 212, BUN of 101, creatinine was 6.82.Marland Kitchen  Ultrasound of the  kidneys showed the kidneys were of normal size, with no hydronephrosis.  X-ray of the lumbosacral spine was negative, with no acute injury,  mostly degenerative joint disease.   The patient was then discharged to the [facility] in stable  condition.      Audria Nine, M.D.  Electronically Signed     AM/MEDQ  D:  06/15/2007  T:  06/15/2007  Job:  [id number]

## 2010-05-26 NOTE — Procedures (Signed)
NAME:  [name], [name]              ACCOUNT NO.:  [id number]   MEDICAL RECORD NO.:  [id number]          PATIENT TYPE:  OUT   LOCATION:  RESP                          FACILITY:  [facility]   PHYSICIAN:  Kofi A. Merlene Laughter, M.D. DATE OF BIRTH:  1925/04/07   DATE OF PROCEDURE:  05/17/2007  DATE OF DISCHARGE:                              EEG INTERPRETATION   INDICATION:  A 77 year old lady who presents with syncope and delirium  suspicious for possible complex partial seizures.   MEDICATIONS:  Not listed but she does take medicines for diabetes and  hypertension.   ANALYSIS:  A 16-channel recording is conducted for 22 minutes.  There is  a well formed posterior rhythm of 99.4 Hz bilaterally, which attenuates  with eye opening. There is beta activity observed in the frontal areas.  Photic stimulation is carried out without significant change in the  background activity.  Awake and drowsy activities are observed.  There  is no focal or lateralized slowing.  There is no epileptiform activity  observed.   IMPRESSION:  Normal recording of the awake and drowsy states.  Please  note that a single recording does not rule out the epileptic seizures.  If clinically indicated, a prolonged 1-hour EEG could be useful.      Kofi A. Merlene Laughter, M.D.  Electronically Signed     KAD/MEDQ  D:  05/18/2007  T:  05/18/2007  Job:  [id number]

## 2010-05-26 NOTE — H&P (Signed)
NAME:  [name], [name]              ACCOUNT NO.:  [id number]   MEDICAL RECORD NO.:  [id number]          PATIENT TYPE:  OBV   LOCATION:  [id number]                          FACILITY:  [facility]   PHYSICIAN:  Bonnielee Haff, MD     DATE OF BIRTH:  [birth year]   DATE OF ADMISSION:  10/05/2007  DATE OF DISCHARGE:  LH                              HISTORY & PHYSICAL   PRIMARY MEDICAL DOCTOR:  Nat Christen, M.D.   NEPHROLOGIST:  Alison Murray, M.D.   ADMISSION DIAGNOSES:  1. Chest pain and shortness of breath.  2. Fall without syncopal episode.  3. Chronic kidney disease.  4. Insulin-dependent diabetes.  5. Hypertension.   CHIEF COMPLAINT:  Shortness of breath and chest pain since this morning.   HISTORY OF PRESENT ILLNESS:  The patient is an 77 year old African  American female who was in her usual state of health last night when she  was trying to answer her home and then she fell out of her bed onto the  floor.  She did not loose consciousness.  She thinks she may have hit  her head, though, she is not very sure.  She is sure that she did hurt  her chest.  Her family members came over and she was helped up to the  bed.  She mentioned that she had been having some generalized weakness  since Wednesday.  She usually is able to ambulate with the walker but  has required a wheelchair since then.  This morning, the patient woke up  feeling well except for the weakness, then she started getting short of  breath.  The chest pain on the right side of the chest persisted, and  she asked her family members to bring her into the hospital.  She denied  any cough.  Denied any fever or chills.  No nausea, vomiting or  abdominal discomfort is present.  The patient seems very anxious at this  time.  She stated that she passed urine only twice today which is quite  unusual for her.  She mentioned that she does go to the bathroom more  than that.   She mentioned that her lower extremity swelling is  actually quite better  compared to previous occasions.  She does not mention any significant  weight gain.   MEDICATIONS AT HOME:  Unfortunately, the patient did not bring her home  medications.  I asked the family member to bring this list tomorrow.  She tells me that she is on half the dose of the Lasix, but otherwise,  she is unable to tell me anything else.  She is on Insulin 70/30 15  units in the morning and 10 units at night.   ALLERGIES:  No known drug allergies.   PAST MEDICAL HISTORY:  1. Chronic kidney disease stage III.  2. History of cancer of uterus.  3. Degenerative joint disease and hip pains in the past.  4. Insulin-dependent diabetes.  5. Hypertension.  6. Strokes in the past.   SOCIAL HISTORY:  She lives alone.  She is a nonsmoker, no drinking, no  illicit drug  use.  She used to use a walker, but currently she is  requiring wheelchair more and more.   FAMILY HISTORY:  Noncontributory.   REVIEW OF SYSTEMS:  A 10-point review of systems was negative except as  in the HPI.   PHYSICAL EXAMINATION:  VITAL SIGNS:  Temperature 99.4, blood pressure  160/61, heart rate 72, respirations 18, saturation 95% on room air.  GENERAL:  This is an obese, elderly black female, anxious, but in no  distress.  HEENT:  There is no pallor, Nonicterus.  Mucous membranes moist.  Pupils  equal and reactive.  No abnormal ENT findings were noted.  NECK:  Soft and supple.  No thyromegaly is appreciated.  HEART:  S1, S2 normal regular.  There is a systolic murmur at the apex.  No S3, S4 noted.  No bruits.  Extremities:  Peripheral pulses palpable but poor.  Very minimal lower  extremity edema is present.  LUNGS:  Clear to auscultation bilaterally.  No wheezes, rales or  rhonchi.  ABDOMEN:  Soft, nontender, nondistended.  Bowel sounds present.  No mass  or organomegaly is appreciated.  MUSCULOSKELETAL:  Otherwise, unremarkable.  NEUROLOGICAL:  The patient is alert and oriented x3.  No  focal  neurological deficits are present.   LABORATORY DATA:  White count is 11,000 with 79% neutrophils, hemoglobin  10.8, MCV 73.  B-met shows sodium of 133, glucose 251, BUN 39,  creatinine 2.7 which is actually improved from the last time she was  here in June.  Actually quite stable.  Troponin is negative x1, BNP 740.   She did have imaging studies in the form of chest x-ray which showed  cardiomegaly and pulmonary vascular congestion.  She apparently also had  an EKG which I was told showed left ventricular hypertrophy, but I do  not see that EKG in her chart at this time.   ASSESSMENT:  This is an 77 year old African American female who fell  last night and then had been having chest pain and shortness of breath  since this morning.  I think her chest pain is likely from the trauma  suffered as a result of the fall.  Pain is tender to palpation, and the  pain is reproducible.  Shortness of breath could be a result of the pain  itself.  There is no clear evidence for fluid overload or pulmonary  edema at this time.  I do not know if there is anxiety causing any of  her symptoms.  Probability for PE is also on the lower side, although,  she is not that ambulatory.  Her heart rate was normal.  Her oxygenation  was normal.   PLAN:  1. Chest pain with shortness of breath.  We will observe her in the      hospital, rule her out for acute coronary syndrome.  She has been      given one dose of IV Lasix.  I will consult Dr. Lowanda Foster in the      morning to see what kind of dosage of diuretics she needs to be in      for her edema and her chronic kidney disease.  2. Falls.  I will check a CT of the head to make sure there is no      intracranial trauma.  We will also check a total CK level to make      sure there is no rhabdo considering her fall.  PT evaluation will  be done.  3. Generalized weakness.  Etiology is not very clear.  Her kidney      disease can be predisposing. CT  of the head will be checked to make      sure there is no other concerning etiology.  4. Chronic kidney disease.  5. Hypertension.  Medications will be continued.  I will go ahead and      start her on the medications that were listed on her discharge      summary from June of this year.  Will await final lists from her      home medications.  We may also need to contact her pharmacy.  6. Diabetes.  Continue with insulin 70/30 at lower dose.  Put her on      sliding scale.  Check HB1C.  7. Depending on how the patient does, she may be able to go back home      in the next 1-2 days.  DVT prophylaxis with the lower dose of      Lovenox will be utilized as well.  8. Low grade fever with slightly elevated white count.  She did have      evidence for a UTI during her previous admission which showed E.      coli sensitive to ceftriaxone, and she was treated with IV Rocephin      at the [facility].  I will repeat another UA and proceed      from there.  If she becomes overtly febrile in the meantime,      consideration to starting empiric antibiotics will be given.      Bonnielee Haff, MD  Electronically Signed     GK/MEDQ  D:  10/06/2007  T:  10/06/2007  Job:  [id number]   cc:   Alison Murray, M.D.  Fax: [phone]   Nat Christen, M.D.

## 2010-05-26 NOTE — Group Therapy Note (Signed)
NAME:  [name], [name]              ACCOUNT NO.:  [id number]   MEDICAL RECORD NO.:  [id number]          PATIENT TYPE:  INP   LOCATION:  A326                          FACILITY:  [facility]   PHYSICIAN:  Audria Nine, M.D.DATE OF BIRTH:  1925-11-01   DATE OF PROCEDURE:  DATE OF DISCHARGE:                                 PROGRESS NOTE   SUBJECTIVE:  The patient feels a little better today.  She feels less  tired; actually, she has had no headaches, no dizziness.  The patient  has been seen by Dr. Tressie Stalker who does not feel there is any indication  for any oncological treatment at this time.  She otherwise remains  stable.   OBJECTIVE:  GENERAL:  Conscious, alert, comfortable not in acute  distress.  Well oriented in time, place and person.  VITAL SIGNS:  Blood pressure is 127/65 with a pulse of 64, respirations  24, temperature 98.2 degrees Fahrenheit, oxygen saturation was 98% on  room air.  Blood sugars are ranging between 135-218.  HEENT:  Normocephalic, atraumatic.  Oral mucosa was moist with no  exudates.  NECK:  Supple.  No JVD or lymphadenopathy.  LUNGS:  Clear clinically with good air entry bilaterally.  HEART:  S1-S2, regular.  No gallops or rubs.  ABDOMEN:  Soft, nontender.  Bowel sounds positive.  No mass is palpable.  EXTREMITIES:  No pitting pedal edema.   LABORATORY DATA:  White blood cell count was 9.5, hemoglobin of 10.7,  hematocrit 31.6, platelet count was 196 with no left shift, sodium is  136, potassium 3.4, chloride 95, CO2 was 30, glucose was 212, BUN 85,  creatinine was 2.47, calcium was 9.5.   ASSESSMENT/PLAN:  1. Acute renal insufficiency probably due to diuretic use.  Patient's      BUN and creatinine are now improving since IV fluids were started      and the diuretics were put on hold.  2. Escherichia coli urinary tract infection.  This is resistant to      Levaquin.  The patient will be switched to ceftriaxone.  3. Diabetes mellitus.  Blood sugars are  in satisfactory range,      although she has a couple of high readings.  4. Chronic anemia likely of chronic disease.  5. Hypokalemia which has been corrected.  6. Intractable hip pain.  This has been extensively reviewed by our      radiologist and has not indicated any fractures but DJD.   DISPOSITION:  Would await continued improvement in her BUN and  creatinine.  Would discontinue her Levaquin and start her on Rocephin.      Audria Nine, M.D.  Electronically Signed     AM/MEDQ  D:  06/14/2007  T:  06/14/2007  Job:  [id number]

## 2010-05-26 NOTE — Consult Note (Signed)
NAME:  [name]              ACCOUNT NO.:  [id number]   MEDICAL RECORD NO.:  [id number]          PATIENT TYPE:  OBV   LOCATION:  [id number]                          FACILITY:  [facility]   PHYSICIAN:  Alison Murray, M.D.DATE OF BIRTH:  Apr 15, 1925   DATE OF CONSULTATION:  10/06/2007  DATE OF DISCHARGE:                                 CONSULTATION   REASON FOR CONSULT:  Renal insufficiency.   Ms. [name] is a patient who was well known to me, who has a history of  chronic renal failure possibly stage III to IV, an 77 year old presently  came with complaints of pain and also shortness of breath.  According to  the patient when she was trying to answer a phone, she fell down out of  her bed.  After that she started having some chest pain, hence came  hospital's emergency room.  Presently, she complains of new weakness.  Her shortness of breath is better, but she has right-sided chest pain.  She does not have any nausea or vomiting.   PAST MEDICAL HISTORY:  As stated above, the patient with:  1. History of diabetes.  2. History of chronic renal failure, probably stage III-IV.  3. History of hypertension.  4. History of CVA.  5. History of osteoarthritis.  6. History of CHF.  7. History of diabetic neuropathy.  8. History of uterine cancer.   SOCIAL HISTORY:  No history of smoking.  No history of alcohol abuse.   MEDICATIONS:  1. Norvasc 10 mg p.o. daily.  2. Lovenox 30 mg p.o. daily.  3. Celexa 20 mg p.o. daily.  4. Gabapentin 600 mg p.o. q.i.d.  5. She is on home sliding scale insulin.  6. Nu-Iron 150 mg p.o. daily.  7. Prinivil 40 mg p.o. daily.  8. Lopressor 25 mg p.o. daily.  9. Protonix 40 mg p.o. daily.   ALLERGIES:  No known allergies.   REVIEW OF SYSTEMS:  History of weakness and also history of chest pain  secondary to fall.  She did not have any loss of consciousness.  She  denies any dizziness or lightheadedness.  Her breathing is okay.  She  denies any orthopnea.   She has occasional shortness of breath.  She has  also occasional edema.  Appetite is good.  She does not have any nausea  or vomiting.   PHYSICAL EXAMINATION:  VITAL SIGNS:  Temperature is 99.9, blood pressure  140/70, and pulse 60.  Her input is 1400 and output of 900.  CHEST:  Clear to auscultation.  HEART:  Regular rate and rhythm.  No murmur.  ABDOMEN:  Soft.  Positive bowel sounds.  EXTREMITIES:  No edema.   LABORATORY DATA:  Her white blood cell count is 9, hemoglobin 9.6,  hematocrit 28.9, and platelet of 183.  Her sodium is 137, potassium is  3.8, chloride 104, CO2 30, BUN is 41, and creatinine 2.5.  Her calcium  is 8.6 and CPK is 1007.   PROBLEMS:  1. Renal insufficiency, chronic.  Her creatinine slightly has      increased, usually trends between 2 and 2.5,  recently stage IV.  At      this moment, she does not have any nausea or vomiting.  2. History of hypertension.  Blood pressure seems to be controlled      very well.  3. Diabetes.  She is on insulin.  4. History of diabetic neuropathy.  She is on Neurontin.  5. Anemia, secondary to iron deficiency and possibly anemia of chronic      disease.  Presently, she is on iron supplement.  Her H&H is      slightly low, but overall stable.  6. History of degenerative joint disease.  7. History of uterine cancer, status post surgery long time ago.   RECOMMENDATIONS:  I will continue her present management.  Since her  renal function is stable and slightly the creatinine is improving, we  will start her on small hydration and encourage to increase her p.o.  intake.  Since the patient has a previous workup at this moment, no  further workup is needed.  However, we will do iron studies to determine  whether we need to increase her iron supplement.  If she has inadequate  iron, probably if H&H continues to go down, we may consider starting her  on Epogen.      Alison Murray, M.D.  Electronically Signed     BB/MEDQ   D:  10/06/2007  T:  10/06/2007  Job:  [id number]

## 2010-05-27 LAB — GLUCOSE, CAPILLARY
Glucose-Capillary: 122 mg/dL — ABNORMAL HIGH (ref 70–99)
Glucose-Capillary: 194 mg/dL — ABNORMAL HIGH (ref 70–99)
Glucose-Capillary: 90 mg/dL (ref 70–99)
Glucose-Capillary: 123 mg/dL — ABNORMAL HIGH (ref 70–99)

## 2010-05-27 LAB — CBC
Hemoglobin: 9.4 g/dL — ABNORMAL LOW (ref 12.0–15.0)
Platelets: 130 10*3/uL — ABNORMAL LOW (ref 150–400)
RBC: 3.69 MIL/uL — ABNORMAL LOW (ref 3.87–5.11)
WBC: 8.2 10*3/uL (ref 4.0–10.5)

## 2010-05-27 LAB — BASIC METABOLIC PANEL
CO2: 24 mEq/L (ref 19–32)
Chloride: 105 mEq/L (ref 96–112)
GFR calc Af Amer: 17 mL/min — ABNORMAL LOW (ref >60)
Potassium: 4.8 mEq/L (ref 3.5–5.1)

## 2010-05-27 LAB — DIFFERENTIAL
Basophils Relative: 0 % (ref 0–1)
Eosinophils Absolute: 0 10*3/uL (ref 0.0–0.7)
Lymphs Abs: 1.2 10*3/uL (ref 0.7–4.0)
Neutro Abs: 6.2 10*3/uL (ref 1.7–7.7)
Neutrophils Relative %: 75 % (ref 43–77)

## 2010-05-28 DIAGNOSIS — D649 Anemia, unspecified: Secondary | ICD-10-CM

## 2010-05-28 DIAGNOSIS — Z86010 Personal history of colonic polyps: Secondary | ICD-10-CM

## 2010-05-28 LAB — TYPE AND SCREEN
ABO/RH(D): A POS
Antibody Screen: NEGATIVE
Unit division: 0

## 2010-05-28 LAB — CBC
MCV: 77.7 fL — ABNORMAL LOW (ref 78.0–100.0)
Platelets: 122 10*3/uL — ABNORMAL LOW (ref 150–400)
RDW: 17.2 % — ABNORMAL HIGH (ref 11.5–15.5)
WBC: 7.5 10*3/uL (ref 4.0–10.5)

## 2010-05-28 LAB — BASIC METABOLIC PANEL
BUN: 69 mg/dL — ABNORMAL HIGH (ref 6–23)
Calcium: 9.1 mg/dL (ref 8.4–10.5)
Creatinine, Ser: 3.16 mg/dL — ABNORMAL HIGH (ref 0.4–1.2)
GFR calc non Af Amer: 14 mL/min — ABNORMAL LOW (ref >60)

## 2010-05-28 LAB — DIFFERENTIAL
Basophils Absolute: 0 10*3/uL (ref 0.0–0.1)
Basophils Relative: 0 % (ref 0–1)
Eosinophils Absolute: 0 10*3/uL (ref 0.0–0.7)
Eosinophils Relative: 1 % (ref 0–5)
Lymphs Abs: 1.3 10*3/uL (ref 0.7–4.0)

## 2010-05-28 LAB — GLUCOSE, CAPILLARY: Glucose-Capillary: 178 mg/dL — ABNORMAL HIGH (ref 70–99)

## 2010-05-28 LAB — PHOSPHORUS: Phosphorus: 5 mg/dL — ABNORMAL HIGH (ref 2.3–4.6)

## 2010-05-29 LAB — CBC
HCT: 26.2 % — ABNORMAL LOW (ref 36.0–46.0)
MCHC: 32.8 g/dL (ref 30.0–36.0)
MCV: 78 fL (ref 78.0–100.0)
RDW: 17.4 % — ABNORMAL HIGH (ref 11.5–15.5)

## 2010-05-29 LAB — DIFFERENTIAL
Eosinophils Relative: 1 % (ref 0–5)
Lymphocytes Relative: 16 % (ref 12–46)
Lymphs Abs: 1.1 10*3/uL (ref 0.7–4.0)
Monocytes Absolute: 0.8 10*3/uL (ref 0.1–1.0)

## 2010-05-29 LAB — BASIC METABOLIC PANEL
CO2: 28 mEq/L (ref 19–32)
Glucose, Bld: 130 mg/dL — ABNORMAL HIGH (ref 70–99)
Potassium: 4.4 mEq/L (ref 3.5–5.1)
Sodium: 140 mEq/L (ref 135–145)

## 2010-05-29 LAB — URINE CULTURE

## 2010-05-29 NOTE — Op Note (Signed)
NAME:  [name], [name]              ACCOUNT NO.:  [id number]   MEDICAL RECORD NO.:  [id number]          PATIENT TYPE:  AMB   LOCATION:  DAY                           FACILITY:  [facility]   PHYSICIAN:  R. Garfield Cornea, M.D. DATE OF BIRTH:  11-14-1925   DATE OF PROCEDURE:  06/01/2005  DATE OF DISCHARGE:                                  PROCEDURE NOTE   PROCEDURE:  Esophagogastroduodenoscopy with Venia Minks dilation, followed by  biopsy.   INDICATIONS FOR PROCEDURE:  The patient is a 77 year old lady, diabetic,  with esophageal dysphagia.  Barium flow esophagogram demonstrated  questionable relative narrowing in distal esophagus.  Barium flow traversed  without difficulty.  There was a core esophageal motility disorder.  The EGD  is now being done This approach has been discussed with the patient at  length for the potential risks, benefits, and alternatives have been  reviewed and questions answered. She is agreeable. Please see documentation  in the medical record.   PROCEDURE:  O2 saturation, blood pressure pulse and respirations were  monitored throughout the entire procedure.  Conscious sedation, Versed 3 mg  IV, Demerol 75 mg IV, in divided doses, Cetacaine spray for topical  oropharyngeal anesthesia.   INSTRUMENT:  Olympus video chip system.   FINDINGS:  Examination of tubular esophagus revealed multiple pale, 2-3 mm  raised nodules.  There were dozens, consistent with squamous papillomas.  There was a small pseudodiverticulum in the distal esophagus.  I wonder if  it could be a noncritical ring.  Esophageal mucosa otherwise appeared  normal.  The EG junction was easily traversed, entering the stomach.  The  gastric cavity was empty and insufflated well with air.  Thorough  examination of gastric mucosa, including retroflexed view of the proximal  stomach, esophagogastric junction demonstrated a small hiatal hernia and a 2  cm polypoid lesion, which was friable and had some blood on  it in the  antrum.  Please see photos.  The pylorus was patent and easily traversed.  Examination of the bulb and second portion revealed no abnormality.   THERAPEUTIC/DIAGNOSTIC MANEUVERS PERFORMED:  A 56 Fr Maloney dilator was  passed to full insertion, and easily backed, revealed no apparent  complication related to passage of dilators.  Subsequently, the gastric  nodule was biopsied and subsequently one of the many esophageal nodules was  biopsied for histologic study.  The patient tolerated the procedure well,  and was reacted.   ENDOSCOPY IMPRESSION:  1.  Multiple pale nodules in the esophagus, consistent with squamous      papillomas.  2.  Small distal __________ diverticulum.  3.  Non-critical-appearing Schatzki's ring, status post dilation and      subsequent esophageal nodule biopsy.  4.  Small hiatal hernia.  5.  Friable gastric polypoid nodule in the antrum, as described above.  6.  Otherwise normal gastric mucosa.   __________  Gastric nodule, patent pylorus, normal D1, D2.   RECOMMENDATIONS:  1.  Followup on pathology.  2.  Followup appointment with [place] in three months.      Bridgette Habermann, M.D.  Electronically  Signed     RMR/MEDQ  D:  06/01/2005  T:  06/01/2005  Job:  [id number]   cc:   Barrie Folk. Berdine Addison, MD  Fax: [phone]

## 2010-05-29 NOTE — Procedures (Signed)
NAME:  [name], HUBLEY NO.:  [id number]   MEDICAL RECORD NO.:  [id number]          PATIENT TYPE:  OUT   LOCATION:  RAD                           FACILITY:  [facility]   PHYSICIAN:  Cristopher Estimable. Lattie Haw, MD, FACCDATE OF BIRTH:  09-22-1925   DATE OF PROCEDURE:  03/29/2006  DATE OF DISCHARGE:                                ECHOCARDIOGRAM   CLINICAL DATA:  A 77 year old woman with dyspnea, hypertension, and  diabetes.   M-MODE:  Aorta 3.1, left atrium 4.2, septum 2.0, posterior wall 1.6,  left ventricular diastole 3.3, left ventricular systole 2.2   IMPRESSION:  1. Technically suboptimal but adequate echocardiographic study.  2. Normal right atrial size; normal right ventricular size and      function; borderline right ventricular hypertrophy.  3. Mild left atrial enlargement.  4. Normal diameter of the proximal ascending aorta; mild calcification      of the wall.  5. Mild to moderate sclerosis of the aortic valve; minimal      insufficiency; no stenosis.  6. Normal mitral valve.  7. Normal tricuspid and pulmonic valve; normal proximal pulmonary      artery.  8. Normal internal dimension of the left ventricle; moderate left      ventricular hypertrophy; normal regional and global function.  9. Normal inferior vena cava.  10.Comparison with prior study of January 18, 2001:  Improved technical      quality; progression of left ventricular hypertrophy.      Cristopher Estimable. Lattie Haw, MD, [facility]  Electronically Signed     RMR/MEDQ  D:  03/29/2006  T:  03/29/2006  Job:  [id number]

## 2010-05-30 ENCOUNTER — Other Ambulatory Visit (INDEPENDENT_AMBULATORY_CARE_PROVIDER_SITE_OTHER): Payer: Self-pay | Admitting: Internal Medicine

## 2010-05-30 DIAGNOSIS — D126 Benign neoplasm of colon, unspecified: Secondary | ICD-10-CM

## 2010-05-30 DIAGNOSIS — K5730 Diverticulosis of large intestine without perforation or abscess without bleeding: Secondary | ICD-10-CM

## 2010-05-30 LAB — HAPTOGLOBIN: Haptoglobin: 159 mg/dL (ref 16–200)

## 2010-05-30 LAB — BASIC METABOLIC PANEL
BUN: 62 mg/dL — ABNORMAL HIGH (ref 6–23)
CO2: 29 mEq/L (ref 19–32)
Calcium: 9.3 mg/dL (ref 8.4–10.5)
Glucose, Bld: 113 mg/dL — ABNORMAL HIGH (ref 70–99)
Sodium: 140 mEq/L (ref 135–145)

## 2010-05-30 LAB — GLUCOSE, CAPILLARY: Glucose-Capillary: 122 mg/dL — ABNORMAL HIGH (ref 70–99)

## 2010-05-30 LAB — CBC
Hemoglobin: 10.3 g/dL — ABNORMAL LOW (ref 12.0–15.0)
MCH: 24.7 pg — ABNORMAL LOW (ref 26.0–34.0)
MCV: 78.4 fL (ref 78.0–100.0)
Platelets: 147 10*3/uL — ABNORMAL LOW (ref 150–400)
RBC: 4.17 MIL/uL (ref 3.87–5.11)

## 2010-05-30 LAB — DIFFERENTIAL
Basophils Relative: 0 % (ref 0–1)
Eosinophils Relative: 1 % (ref 0–5)
Monocytes Absolute: 0.6 10*3/uL (ref 0.1–1.0)
Neutro Abs: 4 10*3/uL (ref 1.7–7.7)
Neutrophils Relative %: 68 % (ref 43–77)

## 2010-06-01 NOTE — Consult Note (Signed)
NAME:  [name], [name]              ACCOUNT NO.:  [id number]  MEDICAL RECORD NO.:  [id number]           PATIENT TYPE:  O  LOCATION:  A306                          FACILITY:  [facility]  PHYSICIAN:  Alison Murray, M.D.DATE OF BIRTH:  [birth year]  DATE OF CONSULTATION:  05/27/2010 DATE OF DISCHARGE:                                CONSULTATION   REASON FOR CONSULTATION:  Worsening of renal failure.  [name] is an 77 years old who is known to me who has history of chronic renal failure, stage IV, history of CVA, anemia, presently came with the complaints of weakness and difficulty in walking.  She said her appetite is not that great, but she does not have any nausea.  No vomiting.  She has also some shortness of breath especially on exertion. Otherwise, she denies any chest pain.  No orthopnea or paroxysmal nocturnal dyspnea.  Presently, consult is called as stated above because of worsening of renal failure.  PAST MEDICAL HISTORY: 1. She has history of chronic renal failure, stage IV. 2. History of hypertension. 3. History of CVA with some right-sided weakness, but mostly     recovered. 4. History of diabetes. 5. She has history of anemia related to her chronic renal failure and     also iron deficiency anemia.  She was on Epogen. 6. History of uterine CA, status post hysterectomy. 7. History of morbid obesity. 8. History of severe cardiomyopathy with low ejection fraction and     history of CHF. 9. History of GERD. 10.History of occasional urinary tract infection. 11.History of neuropathy. 12.History of depression.  MEDICATIONS:  Her medications at this moment consist of: 1. Amlodipine 10 mg p.o. daily. 2. Aspirin 81 mg p.o. daily. 3. Coreg 6.25 mg p.o. b.i.d. 4. She is on Rocephin 1 g IV q.24 h. 5. Lasix 80 mg p.o. daily. 6. Neurontin 600 mg p.o. daily. 7. Tenex 2 mg p.o. daily. 8. Hydralazine 50 mg p.o. t.i.d. 9. Novolin insulin. 10.She is also on isosorbide 40 mg  p.o. daily. 11.Protonix 40 mg p.o. daily. 12.Zocor 10 mg p.o. daily.  ALLERGIES:  No known allergies.  SOCIAL HISTORY:  No history of smoking.  No history of alcohol abuse. The patient lives at home.  FAMILY HISTORY:  No history of renal failure.  REVIEW OF SYSTEMS:  Mainly her complaint seems to be focusing on weakness, feeling tired, occasional exertional dyspnea, and also she has some poor appetite, but she does not have any nausea or vomiting.  She does not remember whether she has lost any weight.  She states that she does not have any diarrhea.  No fevers, chills, or sweating.  Her leg swelling is stable, and we did not check that since she was seen in the office.  PHYSICAL EXAMINATION:  GENERAL:  The patient is alert.  She is lying in bed. VITAL SIGNS:  Temperature is 98.5, pulse of 68, blood pressure is 155/75. CHEST:  Decreased breath sounds.  Otherwise, seems to be clear.  No rales or rhonchi.  No egophony. HEART:  Regular rate and rhythm.  No murmur, no S3. ABDOMEN:  Soft.  Positive bowel sounds. EXTREMITIES:  She had trace edema.  Her white blood cell count is 6.9, hemoglobin 7.0, hematocrit is 22.5, platelets of 123.  Sodium is 130, potassium is 5, BUN is 73, creatinine is 3.38.  Her GFR is 3.38, seems to be declining.  Her albumin is 3.2.  ASSESSMENT: 1. Renal failure, chronic, and presently her renal function seems to     be getting worse.  I am not sure whether she has acute kidney     injury superimposed on chronic. 2. History of anemia, a combination of iron deficiency and anemia of     chronic disease.  The patient on both iron supplement and also on     Epogen.  H and H has declined significantly.  Presently, she is     getting blood transfusion. 3. Hypertension, blood pressure seems to be controlled very well. 4. History of diabetes.  She is on insulin. 5. History of uterine cancer, status post hysterectomy. 6. History of cardiomyopathy with low  ejection fraction.  She does not     have any significant fluid overload. 7. History of gastroesophageal reflux disease. 8. History of cerebrovascular accident with some right-sided weakness. 9. History of hypercholesterolemia. 10.History of neuropathy.  She is on Neurontin.  RECOMMENDATIONS:  I agree with blood transfusion.  We will follow her blood work.  Since she has already an access of fistula on the left arm, if she needs dialysis, we will make arrangement for her.  I will follow the patient.  We will check also her phosphorus.    Alison Murray, M.D.    BB/MEDQ  D:  05/27/2010  T:  05/27/2010  Job:  [id number]  Electronically Signed by Alison Murray M.D. on 06/01/2010 01:42:00 PM

## 2010-06-14 NOTE — Op Note (Signed)
NAME:  [name], [name]              ACCOUNT NO.:  [id number]  MEDICAL RECORD NO.:  [id number]           PATIENT TYPE:  I  LOCATION:  A306                          FACILITY:  [facility]  PHYSICIAN:  Hildred Laser, M.D.    DATE OF BIRTH:  23-Jul-1925  DATE OF PROCEDURE:  05/30/2010 DATE OF DISCHARGE:  05/30/2010                              OPERATIVE REPORT   PROCEDURE:  Colonoscopy.  INDICATIONS:  [name] is an 77 year old [place] American female with multiple medical problems who has chronic anemia, but her hemoglobin has been dropping on fast.  She received transfusion about 4 weeks ago and she is back in with low hemoglobin.  She has not experienced any melena or rectal bleeding, but consented she is losing blood intermittently to her lower GI tract.  Her last colonoscopy was in 1992 with removal of small adenoma.  She is undergoing colonoscopy both for surveillance and diagnostic purposes.  Procedure risks were reviewed with the patient.Informed consent was obtained from her but I also talked with her daughter.  MEDICATIONS FOR CONSCIOUS SEDATION:  None.  FINDINGS:  Procedure performed in endoscopy suite.  The patient's vital signs and O2 sat were monitored during the procedure and remained stable.  The patient was placed in left lateral recumbent position and rectal examination was performed.  No abnormality noted on external or digital exam.  Pentax videoscope was placed in the rectum and advanced under vision into sigmoid colon and beyond.  Preparation was satisfactory.  Scope was passed into cecum.  There was a 5-6 mm polyp next to appendiceal orifice.  This was ablated via cold biopsy and there was continuous active ooze from it, that was observed for few minutes, but then stopped.  Therefore, hemostasis obtained by application of single hemoclip.  Pictures were taken for the record.  There were 2 small polyps at ascending colon.  Both of which ablated via cold biopsy and  submitted to 1 container.  There was no bleeding from these sites. Mucosa and rest of the colon was normal.  There was single diverticulum at sigmoid colon.  Rectal mucosa was normal.  Scope was retroflexed to examine anorectal junction and 4 small anal papilla were noted. Endoscope was withdrawn.  Withdrawal time was 24 minutes.  The patient tolerated the procedure well.  FINAL DIAGNOSES: 1. Examination performed to cecum. 2. Small cecal polyp ablated via cold biopsy and oozing noted from     biopsy site and controlled with application of hemoclip. 3. Two small polyps ablated via cold biopsy from the ascending colon     and submitted into one container. 4. Single diverticulum at sigmoid colon and anal papillae.  RECOMMENDATIONS: 1. We will resume her usual diet. 2. Can resume ASA in 2 days. 3. I will be contacting the patient's daughter with results of biopsy.          ______________________________ Hildred Laser, M.D.     NR/MEDQ  D:  05/30/2010  T:  05/30/2010  Job:  [id number]  cc:   Gaston Islam. Tressie Stalker, MD Fax: [phone]  Dr. Terri Skains  Electronically Signed by Hildred Laser M.D. on  06/14/2010 02:28:26 PM

## 2010-06-14 NOTE — Consult Note (Signed)
NAME:  [name], [name]              ACCOUNT NO.:  [id number]  MEDICAL RECORD NO.:  [id number]           PATIENT TYPE:  I  LOCATION:  A306                          FACILITY:  [facility]  PHYSICIAN:  Hildred Laser, M.D.    DATE OF BIRTH:  10-05-1925  DATE OF CONSULTATION:  05/28/2010 DATE OF DISCHARGE:                                CONSULTATION   REASON FOR CONSULTATION:  Persistent anemia.  HISTORY OF PRESENT ILLNESS:  [name] is an 77 year old black female referred to the emergency room from Dr. Juel Burrow office in [place] for anemia.  She apparently had a hemoglobin of 7.8 noted yesterday.  Since her admission, she has received 2 units of packed RBCs.  [name] does have a history of chronic anemia.  She does tell me that about 2 months ago she had a black tarry stool, but she has not seen any in 2 months. She actually was seeing Dr. Nevada Crane yesterday for routine checkup.  She says her BMs have been brown.  She has had no bright red rectal bleeding or melena recently.  Her appetite is okay.  There is no weight loss. She denies any abdominal pain.  No acid reflux.  Her last colonoscopy was by Dr. Tamala Julian and she reports it was normal.  This was several years ago.  PAST MEDICAL HISTORY:  Insulin-dependent diabetes which she has had for years, hypertension, anemia of chronic illness per records, renal failure.  There are no known allergies.  SURGERIES:  She has had some type of abdominal surgery, but she could not tell me if it was for a malignancy.  This was years ago by Dr. Tamala Julian.  SOCIAL HISTORY:  She does not smoke, drink, or do drugs.  She is divorced.  She has 10 children, two are deceased, one from cancer and one from spinal meningitis.  Eight children are in pretty good health.  FAMILY HISTORY:  Her father deceased, cause unknown.  Mother deceased, cause unknown.  She actually cannot remember the cause of death.  She had  two brothers deceased, one from cancer and one was hit by a  car. Four sisters, three are alive in good health and one had cancer and now  deceased.  HOME MEDICATIONS: 1. Nexium 40 mg a day. 2. Hydralazine 50 mg 3 times a day. 3. Citalopram 20 mg a day. 4. Isosorbide 40 mg 3 times a day. 5. Norvasc 10 mg a day. 6. Lasix 80 mg a day. 7. Iron 150 mg daily. 8. Aspirin 81 mg a day. 9. Coreg 12.5 three times a day. 10.Simvastatin 10 mg a day. 11.Gabapentin 600 mg at night. 12.Guanfacine 2 mg at night.  OBJECTIVE:  VITAL SIGNS:  Her temperature is 98.3, pulse 60, respirations 20, her blood pressure is 153/70, her weight is 101.40 kg, height is 65 inches. HEENT:  Her sclerae are anicteric.  Her conjunctivae are pink.  She has natural teeth.  Her oral mucosa is moist. NECK:  Her thyroid is normal.  There is no cervical lymphadenopathy. LUNGS:  Clear. HEART:  Regular rate and rhythm. ABDOMEN:  Obese.  She does have keloids present to  her abdomen and upper chest.  Bowel sounds are positive.  I do not feel any masses.  Her stool was dark brown, guaiac negative. EXTREMITIES:  She has 3+ edema to her lower extremities.  Chest x-ray; cardiomegaly and pulmonary vascular congestion with bilateral basilar atelectasis.  Phosphorus was 5.0, sodium 140, potassium 4.3, chloride 108, glucose 60, BUN 69, creatinine 3.16, calcium 9.1.  On May 27, 2010; WBC 8.2, hemoglobin 9.4, hematocrit 29.7, and her MCV was 77.8.  PTT was 31, PT/INR 14.7 and 1.13.  Her iron studies from last month; iron was 198, vitamin B12 736, folate 8.6, ferritin 336.  ASSESSMENT:  [name] is an 77 year old female, admitted with anemia. Her iron studies in April were normal.  She was guaiac negative today. She does, however, give a history of having black stools about 2-1/2 months ago, which is now resolved.  RECOMMENDATIONS:  Agree with Hemoccult stools x2 more.  We will get a colonoscopy report.  I will discuss this case with Dr. Laural Golden.  Thank you for allowing [place] to  participate in her care and we will continue to monitor.    ______________________________ Deberah Castle, NP   ______________________________ Hildred Laser, M.D.    TS/MEDQ  D:  05/28/2010  T:  05/29/2010  Job:  [id number]  Electronically Signed by Deberah Castle PA on 06/05/2010 09:40:08 AM Electronically Signed by Hildred Laser M.D. on 06/14/2010 02:29:27 PM

## 2010-06-15 ENCOUNTER — Emergency Department (HOSPITAL_COMMUNITY): Payer: Medicare Other

## 2010-06-15 ENCOUNTER — Emergency Department (HOSPITAL_COMMUNITY)
Admission: EM | Admit: 2010-06-15 | Discharge: 2010-06-15 | Disposition: A | Discharge status: Home / Self Care | Payer: Medicare Other | Source: Home / Self Care | Attending: Emergency Medicine | Admitting: Emergency Medicine

## 2010-06-16 ENCOUNTER — Emergency Department (HOSPITAL_COMMUNITY): Payer: Medicare Other

## 2010-06-16 ENCOUNTER — Inpatient Hospital Stay (HOSPITAL_COMMUNITY)
Admission: EM | Admit: 2010-06-16 | Discharge: 2010-06-20 | DRG: 683 | Disposition: A | Discharge status: Home Health | Payer: Medicare Other | Attending: Internal Medicine | Admitting: Internal Medicine

## 2010-06-16 DIAGNOSIS — Z79899 Other long term (current) drug therapy: Secondary | ICD-10-CM

## 2010-06-16 DIAGNOSIS — I5032 Chronic diastolic (congestive) heart failure: Secondary | ICD-10-CM | POA: Diagnosis present

## 2010-06-16 DIAGNOSIS — D631 Anemia in chronic kidney disease: Secondary | ICD-10-CM | POA: Diagnosis present

## 2010-06-16 DIAGNOSIS — IMO0002 Reserved for concepts with insufficient information to code with codable children: Secondary | ICD-10-CM

## 2010-06-16 DIAGNOSIS — N184 Chronic kidney disease, stage 4 (severe): Secondary | ICD-10-CM | POA: Diagnosis present

## 2010-06-16 DIAGNOSIS — F3289 Other specified depressive episodes: Secondary | ICD-10-CM | POA: Diagnosis present

## 2010-06-16 DIAGNOSIS — N179 Acute kidney failure, unspecified: Principal | ICD-10-CM | POA: Diagnosis present

## 2010-06-16 DIAGNOSIS — I509 Heart failure, unspecified: Secondary | ICD-10-CM | POA: Diagnosis present

## 2010-06-16 DIAGNOSIS — N390 Urinary tract infection, site not specified: Secondary | ICD-10-CM | POA: Diagnosis present

## 2010-06-16 DIAGNOSIS — R5381 Other malaise: Secondary | ICD-10-CM | POA: Diagnosis present

## 2010-06-16 DIAGNOSIS — Z7982 Long term (current) use of aspirin: Secondary | ICD-10-CM

## 2010-06-16 DIAGNOSIS — M109 Gout, unspecified: Secondary | ICD-10-CM | POA: Diagnosis present

## 2010-06-16 DIAGNOSIS — Z8249 Family history of ischemic heart disease and other diseases of the circulatory system: Secondary | ICD-10-CM

## 2010-06-16 DIAGNOSIS — M79609 Pain in unspecified limb: Secondary | ICD-10-CM

## 2010-06-16 DIAGNOSIS — H9190 Unspecified hearing loss, unspecified ear: Secondary | ICD-10-CM | POA: Diagnosis present

## 2010-06-16 DIAGNOSIS — Z8673 Personal history of transient ischemic attack (TIA), and cerebral infarction without residual deficits: Secondary | ICD-10-CM

## 2010-06-16 DIAGNOSIS — Z794 Long term (current) use of insulin: Secondary | ICD-10-CM

## 2010-06-16 DIAGNOSIS — F329 Major depressive disorder, single episode, unspecified: Secondary | ICD-10-CM | POA: Diagnosis present

## 2010-06-16 DIAGNOSIS — I129 Hypertensive chronic kidney disease with stage 1 through stage 4 chronic kidney disease, or unspecified chronic kidney disease: Secondary | ICD-10-CM | POA: Diagnosis present

## 2010-06-16 DIAGNOSIS — E119 Type 2 diabetes mellitus without complications: Secondary | ICD-10-CM | POA: Diagnosis present

## 2010-06-16 DIAGNOSIS — Z8542 Personal history of malignant neoplasm of other parts of uterus: Secondary | ICD-10-CM

## 2010-06-16 DIAGNOSIS — B961 Klebsiella pneumoniae [K. pneumoniae] as the cause of diseases classified elsewhere: Secondary | ICD-10-CM | POA: Diagnosis present

## 2010-06-16 LAB — URINALYSIS, ROUTINE W REFLEX MICROSCOPIC
Glucose, UA: NEGATIVE mg/dL
Specific Gravity, Urine: 1.017 (ref 1.005–1.030)

## 2010-06-16 LAB — URINE MICROSCOPIC-ADD ON

## 2010-06-16 LAB — CARDIAC PANEL(CRET KIN+CKTOT+MB+TROPI): Troponin I: <0.3 ng/mL (ref <0.30)

## 2010-06-16 LAB — CBC
HCT: 27.1 % — ABNORMAL LOW (ref 36.0–46.0)
Hemoglobin: 8.8 g/dL — ABNORMAL LOW (ref 12.0–15.0)
WBC: 10.2 10*3/uL (ref 4.0–10.5)

## 2010-06-16 LAB — BASIC METABOLIC PANEL
CO2: 31 mEq/L (ref 19–32)
Chloride: 97 mEq/L (ref 96–112)
GFR calc Af Amer: 13 mL/min — ABNORMAL LOW (ref >60)
Sodium: 139 mEq/L (ref 135–145)

## 2010-06-16 LAB — PREPARE RBC (CROSSMATCH)

## 2010-06-17 ENCOUNTER — Inpatient Hospital Stay (HOSPITAL_COMMUNITY): Payer: Medicare Other

## 2010-06-17 LAB — CBC
HCT: 24.4 % — ABNORMAL LOW (ref 36.0–46.0)
Hemoglobin: 7.8 g/dL — ABNORMAL LOW (ref 12.0–15.0)
MCH: 24.8 pg — ABNORMAL LOW (ref 26.0–34.0)
MCHC: 32 g/dL (ref 30.0–36.0)
MCV: 77.5 fL — ABNORMAL LOW (ref 78.0–100.0)

## 2010-06-17 LAB — VITAMIN B12: Vitamin B-12: 660 pg/mL (ref 211–911)

## 2010-06-17 LAB — BASIC METABOLIC PANEL
CO2: 31 mEq/L (ref 19–32)
Calcium: 8.3 mg/dL — ABNORMAL LOW (ref 8.4–10.5)
Chloride: 98 mEq/L (ref 96–112)
GFR calc Af Amer: 12 mL/min — ABNORMAL LOW (ref >60)
Sodium: 140 mEq/L (ref 135–145)

## 2010-06-17 LAB — IRON AND TIBC: Saturation Ratios: 17 % — ABNORMAL LOW (ref 20–55)

## 2010-06-17 LAB — FOLATE: Folate: 8.8 ng/mL

## 2010-06-17 LAB — GLUCOSE, CAPILLARY
Glucose-Capillary: 149 mg/dL — ABNORMAL HIGH (ref 70–99)
Glucose-Capillary: 121 mg/dL — ABNORMAL HIGH (ref 70–99)

## 2010-06-17 LAB — CARDIAC PANEL(CRET KIN+CKTOT+MB+TROPI)
CK, MB: 2.4 ng/mL (ref 0.3–4.0)
Relative Index: INVALID (ref 0.0–2.5)
Relative Index: INVALID (ref 0.0–2.5)
Total CK: 67 U/L (ref 7–177)
Troponin I: <0.3 ng/mL (ref <0.30)

## 2010-06-17 LAB — NA AND K (SODIUM & POTASSIUM), RAND UR: Potassium Urine: 40 mEq/L

## 2010-06-18 ENCOUNTER — Ambulatory Visit: Payer: Medicare Other

## 2010-06-18 LAB — GLUCOSE, CAPILLARY: Glucose-Capillary: 321 mg/dL — ABNORMAL HIGH (ref 70–99)

## 2010-06-18 LAB — BASIC METABOLIC PANEL
CO2: 29 mEq/L (ref 19–32)
Calcium: 8.1 mg/dL — ABNORMAL LOW (ref 8.4–10.5)
Chloride: 94 mEq/L — ABNORMAL LOW (ref 96–112)
Creatinine, Ser: 4.31 mg/dL — ABNORMAL HIGH (ref 0.4–1.2)
Glucose, Bld: 297 mg/dL — ABNORMAL HIGH (ref 70–99)

## 2010-06-18 LAB — CBC
Hemoglobin: 8.5 g/dL — ABNORMAL LOW (ref 12.0–15.0)
RBC: 3.43 MIL/uL — ABNORMAL LOW (ref 3.87–5.11)
WBC: 5.8 10*3/uL (ref 4.0–10.5)

## 2010-06-18 LAB — URINE CULTURE: Culture  Setup Time: 201206051408

## 2010-06-18 NOTE — H&P (Signed)
NAME:  [name], [name]              ACCOUNT NO.:  [id number]  MEDICAL RECORD NO.:  [id number]           PATIENT TYPE:  O  LOCATION:  A306                          FACILITY:  [facility]  PHYSICIAN:  Bonnielee Haff, MD     DATE OF BIRTH:  July 07, 1925  DATE OF ADMISSION:  05/26/2010 DATE OF DISCHARGE:  LH                             HISTORY & PHYSICAL   PRIMARY CARE PHYSICIAN:  New physician in [place] by the name of Dr. Nevada Crane.  NEPHROLOGIST:  Alison Murray, MD  ADMISSION DIAGNOSES: 1. Acute on chronic renal failure. 2. Anemia secondary to chronic disease with weakness, requiring     transfusion. 3. History of insulin-dependent diabetes. 4. History of hypertension, poorly controlled.  CHIEF COMPLAINT:  Weakness.  HISTORY OF PRESENT ILLNESS:  The patient is an 77 year old African American female who was sent over by her doctor because of anemia.  Her hemoglobin was 7.8 when she was evaluated.  The patient has been complaining of some weakness as well; however, has not seen any blood in the stool or any other bleeding anywhere.  She denies any nausea, vomiting, or diarrhea.  Denies any dizziness or lightheadedness.  No syncopal episodes.  No abdominal pain.  Just been overall generalized weakness.  MEDICATIONS AT HOME: 1. Nexium 40 mg daily. 2. Hydralazine 50 mg t.i.d. 3. Citalopram 20 mg daily. 4. Isosorbide dinitrate 40 mg t.i.d. 5. Amlodipine 10 mg once a day. 6. Furosemide 80 mg daily. 7. Aspirin 81 mg daily. 8. Carvedilol 12.5 mg t.i.d. 9. Simvastatin 10 mg every evening. 10.Gabapentin 600 mg at bedtime. 11.Guanfacine 2 mg at bedtime.  She is on insulin 70/30 15 units b.i.d., it is unclear if the patient is also on iron pills or not.  She was also supposed to be on Aranesp, but it is unclear if she has received that recently.  ALLERGIES:  No known drug allergies.  PAST MEDICAL HISTORY:  Positive for: 1. Diabetes. 2. Hypertension, which is poorly controlled. 3. History  of chronic kidney disease, stage IV. 4. History of anemia secondary to chronic disease. 5. She has a history of UTI back earlier this year. 6. History of cholecystitis and cholangitis in 2011. 7. History of diastolic heart failure. 8. History of stroke with no residual deficit. 9. History of episodic sinus bradycardia. 10.History of hyperlipidemia. 11.History of uterine cancer, status post hysterectomy. 12.Depression. 13.Morbid obesity. 14.Gout. 15.Bilateral cataracts and glaucoma, it is unclear if she is on any     eye drops.  SOCIAL HISTORY:  Lives in [place] by herself.  Uses a walker to ambulate, sometime needs a wheelchair.  No smoking, alcohol or illicit drug use.  FAMILY HISTORY:  Unremarkable.  REVIEW OF SYSTEMS:  GENERAL:  Positive for weakness, malaise.  HEENT: Unremarkable.  CARDIOVASCULAR:  Unremarkable.  RESPIRATORY: Unremarkable.  GI:  Unremarkable.  GU:  Unremarkable.  NEUROLOGIC: Unremarkable.  PSYCHIATRIC:  Unremarkable.  DERMATOLOGICAL: Unremarkable.  Other systems were reviewed and found to be negative.  PHYSICAL EXAMINATION:  VITAL SIGNS:  Temperature 98.7, blood pressure 145/59, heart rate 58, respiratory rate 22, saturation 100% on room air. GENERAL:  This is an  obese elderly black female in no distress. HEENT:  Head is normocephalic and atraumatic.  Pupils are equal reacting.  No pallor, no icterus.  Oral mucous membrane is moist.  No oral lesions noted. NECK:  Soft and supple.  No thyromegaly is appreciated. LUNGS:  Clear to auscultation bilaterally with no wheezing, rales, or rhonchi. CARDIOVASCULAR:  S1-S2 is normal and regular.  No S3, S4, rubs, murmurs or bruits. EXTREMITIES:  Her right leg is larger than the left, but this is chronic per the patient and her daughter. ABDOMEN:  Soft, nontender, nondistended.  Bowel sounds are present.  No masses or organomegaly appreciated. NEUROLOGIC:  Neurologically, she is alert and oriented x3.  No  focal neurological deficits are present.  LABORATORY DATA:  Her white cell count is normal, hemoglobin is 7.2, MCV is 76, platelet count is 123.  Coags are normal.  Potassium is 5.0, glucose 177, BUN is 73, creatinine is 3.38 at her baseline, her creatinine is about 2.5, BUN is 56, calcium is 9.1.  UA shows small blood, some protein, large leukocytes, 3-6 rbc's, innumerable wbc's, many bacteria.  Urine culture is pending.  IMAGING STUDIES:  She had a chest x-ray which showed cardiomegaly and pulmonary vascular congestion with bilateral basilar atelectasis.  EKG was done, which showed sinus bradycardia at 57, normal axis, intervals appear to be in the normal range.  There is diffuse T- inversion which is chronic compared to previous EKGs and basically no new EKG findings compared to previous EKG.  ASSESSMENT:  This is an 77 year old African American female with numerous medical issues that comes in with weakness that was found to have anemia by her PCT, also is found to have acute on chronic renal failure.  PLAN: 1. Anemia secondary to chronic disease since she is symptomatic     because of weakness.  We will transfuse her.  I am wonder why this     patient is not getting her Aranesp anymore.  We will consult Dr.     Lowanda Foster. 2. Acute on chronic kidney disease.  We will consult Dr. Lowanda Foster.     After she gets transfusions, we will recheck her BMET and consider     further management at that time and await Dr. Florentina Addison input. 3. Diabetes.  Continue with insulin. 4. Hypertension.  Continue with her home medications. 5. Sinus bradycardia.  We will cut down the dose of carvedilol, she     is, however, asymptomatic. 6. She is a full code. 7. UTI.  Will be treated with ceftriaxone.  Await full list of her home medicines.  Further management decisions will depend on results of further testing and the patient's response to treatment.  Once her hemoglobin comes up and if her renal  function improves, she potentially could go home by tomorrow evening.  Bonnielee Haff, MD     GK/MEDQ  D:  05/27/2010  T:  05/27/2010  Job:  [id number]  cc:   Alison Murray, M.D. [phone]  Electronically Signed by Bonnielee Haff MD on 06/18/2010 10:46:58 PM

## 2010-06-19 LAB — GLUCOSE, CAPILLARY
Glucose-Capillary: 205 mg/dL — ABNORMAL HIGH (ref 70–99)
Glucose-Capillary: 250 mg/dL — ABNORMAL HIGH (ref 70–99)

## 2010-06-19 LAB — BASIC METABOLIC PANEL
Calcium: 8.6 mg/dL (ref 8.4–10.5)
Creatinine, Ser: 3.9 mg/dL — ABNORMAL HIGH (ref 0.4–1.2)
GFR calc non Af Amer: 11 mL/min — ABNORMAL LOW (ref >60)
Glucose, Bld: 249 mg/dL — ABNORMAL HIGH (ref 70–99)
Sodium: 133 mEq/L — ABNORMAL LOW (ref 135–145)

## 2010-06-20 LAB — BASIC METABOLIC PANEL
BUN: 132 mg/dL — ABNORMAL HIGH (ref 6–23)
CO2: 27 mEq/L (ref 19–32)
GFR calc non Af Amer: 11 mL/min — ABNORMAL LOW (ref >60)
Glucose, Bld: 229 mg/dL — ABNORMAL HIGH (ref 70–99)
Potassium: 4.5 mEq/L (ref 3.5–5.1)
Sodium: 133 mEq/L — ABNORMAL LOW (ref 135–145)

## 2010-06-20 LAB — TYPE AND SCREEN

## 2010-06-21 NOTE — H&P (Signed)
NAMEMarland Wood  FONTELLA, [name]:  [id number]  MEDICAL RECORD NO.:  [id number]  LOCATION:  6736                         FACILITY:  [facility]  PHYSICIAN:  Derrill Kay, MD       DATE OF BIRTH:  15-Feb-1925  DATE OF ADMISSION:  06/16/2010 DATE OF DISCHARGE:                             HISTORY & PHYSICAL   CHIEF COMPLAINT:  Weakness.  HISTORY OF PRESENT ILLNESS:  Ms. [name] is an elderly 77 year old female with end-stage renal disease who was not yet on dialysis, chronic anemia who was recently had a GI workup with a colonoscopy that was done on May 30, 2010 at which point she had a polyp removed that was otherwise essentially unrevealing.  She has insulin-dependent diabetes and many other medical problems who has been having weakness over the last several days.  She often gets generalized weakness when her hemoglobin goes to low.  She frequently requires blood transfusions. However, also since Sunday, she has been have experiencing some right lower extremity pain.  She says the whole entire leg hurts, the hip hurts, the calf hurts, the knee hurts, it hurts with any movement and she has been having some waxing and waning swelling with it.  It was concerned that she might have a hip fracture.  She had a CT of her pelvis done which was negative for fracture.  She does have chronic bilateral sacral ileitis, however, again it did not show any acute fracture.  Otherwise, her workup in the ED is basically negative for any obvious source of her weakness except for her anemia of 8.8.  Her last hemoglobin was 10.3 on May 19, however, that was after blood transfusion.  She is very hard of hearing and one of her daughters is with her right now and they do not know if she has been having any overt GI blood loss.  There are seven children involved.  The patient lives alone but she does have a caregiver that is with her all the time.  It sounds like she is just been having  deterioration in her health overall that has been steadily declining over the last several months.  She denies any fevers.  She denies any chest pain or shortness of breath.  REVIEW OF SYSTEMS:  Otherwise negative.  ALLERGIES:  None.  PAST MEDICAL HISTORY: 1. Again, she had a colonoscopy done recently that required some polyp     removal, otherwise no internal-external hemorrhoids. 2. Chronic kidney failure, stage IV. 3. Chronic anemia. 4. Insulin-dependent diabetes. 5. Hypertension. 6. History of CVA in the past. 7. Diabetes. 8. Status post cholecystitis and history of cholangitis in 2001. 9. Diastolic heart failure. 10.History of stroke with no residual deficits. 11.History of sinus bradycardia. 12.Status post hysterectomy after history of uterine cancer. 13.Hyperlipidemia. 14.Depression. 15.Morbid obesity. 16.Gout. 17.Cataracts and glaucoma.  SOCIAL HISTORY:  She lives alone.  She has seven children, negative x3 and uses a walker at home with decline in health recently because of her leg pain.  MEDICATIONS: 1. NovoLog 70/30, 8 units every morning, 5 units at bedtime. 2. Oxycodone 5 mg 1-2 tablets every 4 hours as needed for pain. 3. Carvedilol 12.5 mg  twice a day. 4. Simvastatin 10 mg at bedtime. 5. Aspirin 81 mg a day. 6. Lasix 80 mg twice a day. 7. Nu-Iron 150 mg daily. 8. Gabapentin 600 mg at bedtime. 9. Imdur 40 mg 3 times a day. 10.Citalopram 20 mg daily. 11.Hydralazine 50 mg q.8 h. 12.Guanfacine 2 mg at bedtime. 13.Nexium 40 mg a day. 14.Amlodipine 10 mg daily.  PHYSICAL EXAMINATION:  VITAL SIGNS:  Temperature is 98.9, blood pressure 126/30, pulse 60, respirations 18, and 95% O2 sats on room air. GENERAL:  She is alert, hard of hearing, and in no apparent distress. HEENT:  Extraocular muscles are intact.  Pupils equal to light. Oropharynx clear.  Mucous membranes moist. NECK:  No JVD.  No carotid bruits. COR:  Regular rate and rhythm without murmurs or  gallops. CHEST:  Clear to auscultation bilaterally with no wheezes, rhonchi, or rales. ABDOMEN:  Soft, nontender, nondistended.  Positive bowel sounds.  No hepatosplenomegaly. EXTREMITIES:  No clubbing, cyanosis, or edema.  She has got no noticeable swelling in the right lower extremity compared to the left. She does have pain with movement of her left lower extremity.  In general, she can move her ankle without any pain but otherwise she has a difficulty lifting that leg because of the pain in her calf all the way up to her thigh. SKIN:  No rashes. NEURO:  No focal neurologic deficits.  Cranial nerves II-XII grossly intact. PSYCH:  Normal affect.  IMAGING:  CT of her pelvis again negative for any fractures.  Chest x- ray:  Very poor quality but no focal infiltrate.  Occult blood negative. Urinalysis is large amount of leukocytes with too numerous to count white blood cells and few bacteria.  BUN and creatinine are 96 and 3.97. Her baseline creatinine is around 3.2.  Cardiac enzymes negative.  White count is 10.2 and hemoglobin is 8.8.  ASSESSMENT AND PLAN:  This is an 77 year old female with generalized weakness and urinary tract infection. 1. Generalized weakness, probably multifactorial. 2. Urinary tract infection.  I will place her on Rocephin renally     dosed and obtain urine culture. 3. Chronic anemia.  I will transfuse her 1 unit of blood and see if     that helps with her weakness.  However, there is no overt blood     loss and actually her hemoglobin is better than what it usually is.     We will repeat a CBC in the morning. 4. Chronic renal failure, stage IV.  She already has dialysis access.     I do not think there is any need for dialysis at this point.  She     is not uremic.  She has an appointment with her nephrologist in 2     days.  If the patient is still here, we will need to contact them     to let them know that she is indeed still here. 5. Right lower  extremity pain.  She has had ultrasound of her leg to     make sure she does not have a deep venous thrombosis in this lower     extremity.  If that is negative, we will obtain a physical therapy     consultation.  We will hold off on any anticoagulation at this     point unless she does have a deep venous thrombosis. 6. Insulin-dependent diabetes.  We will continue her home medication     regimen for that. 7. I am going  to hold her Lasix just for the next 24 hours due to some     worsening in her renal failure and this will need to be reassessed     and probably restarted tomorrow.  I am also going to give her 1     unit of blood as to not fluid overload her.  Obviously, if she has     any worsening in her renal failure or any developing uremic     symptoms, we will need to contact Nephrology to institute dialysis.     The patient is being admitted to Telemetry Floor to [facility] Team     1.  Further recommendations depending on her hospital course.          ______________________________ Derrill Kay, MD     RD/MEDQ  D:  06/16/2010  T:  06/17/2010  Job:  [id number]  Electronically Signed by Derrill Kay MD on 06/21/2010 07:12:39 PM

## 2010-06-22 LAB — GLUCOSE, CAPILLARY: Glucose-Capillary: 223 mg/dL — ABNORMAL HIGH (ref 70–99)

## 2010-06-25 ENCOUNTER — Encounter: Payer: Self-pay | Admitting: Vascular Surgery

## 2010-06-25 NOTE — Discharge Summary (Signed)
NAME:  [name], [name] NO.:  [id number]  MEDICAL RECORD NO.:  [id number]  LOCATION:  6736                         FACILITY:  [facility]  PHYSICIAN:  Estill Cotta, MD       DATE OF BIRTH:  Nov 29, 1925  DATE OF ADMISSION:  06/16/2010 DATE OF DISCHARGE:                        DISCHARGE SUMMARY - REFERRING   PRIMARY CARE PHYSICIAN:  Dr. Nevada Crane in [place] NEPHROLOGIST:  Dr. Alison Murray, MD  PRIMARY GASTROENTEROLOGIST:  Hildred Laser, MD  DISCHARGE DIAGNOSIS: 1. Generalized debility. 2. Klebsiella urinary tract infection. 3. Chronic anemia. 4. Acute renal failure, on chronic kidney disease stage IV. 5. Gouty arthropathy. 6. Insulin-dependent diabetes mellitus.  CONSULTATIONS:  Renal Service, Dr. Florene Glen.  DISCHARGE MEDICATIONS: 1. Tylenol Extra Strength 500 mg p.o. every 6 hours as needed for     pain. 2. Ciprofloxacin 250 mg p.o. b.i.d. for 4 days to complete the course. 3. Prednisone 20 mg for 2 days and 10 mg for 2 days then off. 4. Amlodipine 10 mg p.o. daily. 5. Aspirin 81 mg daily. 6. Coreg 12.5 mg p.o. b.i.d. 7. Citalopram 20 mg p.o. daily. 8. Gabapentin 600 mg p.o. daily at bedtime. 9. Guanfacine 2 mg p.o. daily at bedtime. 10.Hydralazine 50 mg p.o. t.i.d. 11.Isosorbide dinitrate 40 mg p.o. t.i.d. 12.Nexium 40 mg p.o. daily. 13.70/30 NovoLog mix 8 units q.a.m., 5 units q.p.m. 14.Nu-Iron 150 mg p.o. daily. 15.Oxycodone 5 mg 1-2 tablets p.o. every 4 hours as needed for pain. 16.Simvastatin 10 mg p.o. daily at bedtime.  The patient was counseled to stop taking the following medication which include Lasix.  BRIEF HISTORY OF PRESENT ILLNESS:  At the time of admission, [name] is an elderly 77 year old female with end-stage renal disease who was not yet on dialysis, chronic anemia who recently had a GI workup with colonoscopy on May 19 which at this point she had a polyp removed and otherwise was essentially unrevealing.  The patient often  gets generalized weakness and her hemoglobin was low.  She frequently requests blood transfusions.  However, she presented to the hospital with right lower extremity pain.  She stated that her whole leg hurts, hip hurts, knee hurts, and it hurts with any movement and she was having waxing and waning swelling with it.  She was concerned that she may have the hip fracture.  The patient was noticed to have anemia of 8.8.  The patient was admitted for evaluation of the weakness.  RADIOLOGICAL DATA:  Right foot x-ray June 4, significant osseous demineralization.  No definitive acute findings.  A right tibia-fibula x- ray June 4, no acute bony abnormality to the right tibia-fibula, osteoarthritis of the right knee with joint effusion.  If there is concern for any acute knee injury, recommended that she get a knee study.  Right hip x-ray June 4, no acute bony abnormalities.  Chest x- ray two-view June 5, cardiomegaly with vascular congestion and bibasilar atelectasis, stable findings.  CT of pelvis without contrast June 5, negative for fracture or other acute abnormality.  Chronic bilateral sacroiliitis is stable in appearance compared to CT abdomen and pelvis October 2011, extensive atherosclerosis.  Renal ultrasound June 6 showed relatively small kidneys with diffuse  increased echogenicity consistent with medical renal disease.  No evidence for urinary tract obstruction.  PERTINENT LAB DIAGNOSTIC DATA:  CBC at the time of admission; white count 10.2, hemoglobin 8.8, hematocrit 27.1, platelets 134.  Troponin less than 0.3.  BMET; sodium 139, potassium 4.4, BUN 96, creatinine 3.9. UA showed UTI.  Fecal occult blood test was negative.  ESR was elevated at 68.  CRP 10.9.  Urine culture positive for more than 100,000 colonies of klebsiella pneumonia.  BMET at the time of the discharge; sodium 132, potassium 4.5, BUN 132, the creatinine peaked at 4.3.  BRIEF HOSPITALIZATION COURSE:  [name]  is an 77 year old female who was admitted with generalized weakness.  She was found to have an UTI and acute renal insufficiency. 1. Klebsiella UTI.  The patient was placed on the Rocephin.  Urine     culture came back as positive for Klebsiella pneumonia.  The     patient will continue antibiotics for another 4 days to complete     the course for Klebsiella pneumoniae UTI. 2. Acute renal failure, superimposed on chronic kidney disease stage     IV.  Renal service was consulted and per recommendations Lasix was     held.  The patient was gently hydrated.  The creatinine function     peaked at 4.3, however has been trending down now to creatinine of     3.9 at the time of discharge.  At this time per renal     recommendation, the patient does not have uremia and does not need     hemodialysis.  However, she should follow up with her primary     nephrologist Dr. Lowanda Foster within next 7 days for evaluation. 3. Anemia, likely anemia of chronic disease as the patient recently     had a colonoscopy done one May 19 which had shown a polyp which was     removed and otherwise essentially unrevealing.  She was started on     IV Venofer per the renal recommendation.  She should continue p.o.     iron supplementation. 4. The patient will be discharged to home today.  For generalized     debility, physical therapy evaluation was done which did recommend     skilled nursing facility placement.  I discussed in detail with the     patient and her daughter.  The patient herself declined to go to     any skilled nursing facility.  She has a caretaker at home and     requested to be discharged home.  Per my discussion with the     patient's daughter [name], the patient does not want to go to     our nursing facility and has also declined that in the past.  The     patient and her family were explained the risk of falling and     decline in health and the patient understands the risk.  However      still requested to be discharged home.  PHYSICAL EXAMINATION:  VITAL SIGNS:  At the time of discharge; temperature 98.1, pulse 57, respirations 18, blood pressure 146/65, O2 sats 93% on room air. GENERAL:  The patient is alert, awake and oriented x3 not in acute distress. HEENT:  Anicteric sclerae and conjunctivae.  Pupils are reactive to light and accommodation.  EOMI. NECK:  Supple.  No lymphadenopathy. CARDIOVASCULAR:  S1 and S2. CHEST:  Decreased breath sounds at the bases, otherwise clear. ABDOMEN:  Soft, nontender, nondistended.  Normal bowel sounds. EXTREMITIES:  Her right lower extremity in the heel protector.  Discharge followup with Dr. Lowanda Foster, primary nephrologist, within next 7-10 days and Dr. Nevada Crane, primary care physician, within next 7-10 days.  Discharge time 35 minutes.     Estill Cotta, MD     RR/MEDQ  D:  06/20/2010  T:  06/20/2010  Job:  [id number]  cc:   Alison Murray, M.D. Dr. Rickey Barbara C. Florene Glen, M.D.  Electronically Signed by Chanta Bauers  on 06/25/2010 03:31:18 PM

## 2010-07-09 ENCOUNTER — Emergency Department (HOSPITAL_COMMUNITY): Payer: Medicare Other

## 2010-07-09 ENCOUNTER — Inpatient Hospital Stay (HOSPITAL_COMMUNITY)
Admission: EM | Admit: 2010-07-09 | Discharge: 2010-07-14 | DRG: 292 | Disposition: A | Discharge status: Home Health | Payer: Medicare Other | Attending: Internal Medicine | Admitting: Internal Medicine

## 2010-07-09 DIAGNOSIS — D696 Thrombocytopenia, unspecified: Secondary | ICD-10-CM | POA: Diagnosis present

## 2010-07-09 DIAGNOSIS — E119 Type 2 diabetes mellitus without complications: Secondary | ICD-10-CM | POA: Diagnosis present

## 2010-07-09 DIAGNOSIS — Z7982 Long term (current) use of aspirin: Secondary | ICD-10-CM

## 2010-07-09 DIAGNOSIS — N184 Chronic kidney disease, stage 4 (severe): Secondary | ICD-10-CM | POA: Diagnosis present

## 2010-07-09 DIAGNOSIS — E875 Hyperkalemia: Secondary | ICD-10-CM | POA: Diagnosis present

## 2010-07-09 DIAGNOSIS — I129 Hypertensive chronic kidney disease with stage 1 through stage 4 chronic kidney disease, or unspecified chronic kidney disease: Secondary | ICD-10-CM | POA: Diagnosis present

## 2010-07-09 DIAGNOSIS — I509 Heart failure, unspecified: Secondary | ICD-10-CM | POA: Diagnosis present

## 2010-07-09 DIAGNOSIS — I5033 Acute on chronic diastolic (congestive) heart failure: Principal | ICD-10-CM | POA: Diagnosis present

## 2010-07-09 DIAGNOSIS — E785 Hyperlipidemia, unspecified: Secondary | ICD-10-CM | POA: Diagnosis present

## 2010-07-09 DIAGNOSIS — N390 Urinary tract infection, site not specified: Secondary | ICD-10-CM | POA: Diagnosis present

## 2010-07-09 DIAGNOSIS — Z8673 Personal history of transient ischemic attack (TIA), and cerebral infarction without residual deficits: Secondary | ICD-10-CM

## 2010-07-09 DIAGNOSIS — F3289 Other specified depressive episodes: Secondary | ICD-10-CM | POA: Diagnosis present

## 2010-07-09 DIAGNOSIS — D631 Anemia in chronic kidney disease: Secondary | ICD-10-CM | POA: Diagnosis present

## 2010-07-09 DIAGNOSIS — H269 Unspecified cataract: Secondary | ICD-10-CM | POA: Diagnosis present

## 2010-07-09 DIAGNOSIS — H409 Unspecified glaucoma: Secondary | ICD-10-CM | POA: Diagnosis present

## 2010-07-09 DIAGNOSIS — Z6835 Body mass index (BMI) 35.0-35.9, adult: Secondary | ICD-10-CM

## 2010-07-09 DIAGNOSIS — Z794 Long term (current) use of insulin: Secondary | ICD-10-CM

## 2010-07-09 DIAGNOSIS — F329 Major depressive disorder, single episode, unspecified: Secondary | ICD-10-CM | POA: Diagnosis present

## 2010-07-09 DIAGNOSIS — B961 Klebsiella pneumoniae [K. pneumoniae] as the cause of diseases classified elsewhere: Secondary | ICD-10-CM | POA: Diagnosis present

## 2010-07-09 LAB — CBC
HCT: 22.9 % — ABNORMAL LOW (ref 36.0–46.0)
Hemoglobin: 7.3 g/dL — ABNORMAL LOW (ref 12.0–15.0)
MCV: 76.3 fL — ABNORMAL LOW (ref 78.0–100.0)
RBC: 3 MIL/uL — ABNORMAL LOW (ref 3.87–5.11)
RDW: 15.8 % — ABNORMAL HIGH (ref 11.5–15.5)
WBC: 6.6 10*3/uL (ref 4.0–10.5)

## 2010-07-09 LAB — BASIC METABOLIC PANEL
Calcium: 7.5 mg/dL — ABNORMAL LOW (ref 8.4–10.5)
GFR calc Af Amer: 22 mL/min — ABNORMAL LOW (ref >60)
GFR calc non Af Amer: 19 mL/min — ABNORMAL LOW (ref >60)
Glucose, Bld: 155 mg/dL — ABNORMAL HIGH (ref 70–99)
Potassium: 5.7 mEq/L — ABNORMAL HIGH (ref 3.5–5.1)
Sodium: 141 mEq/L (ref 135–145)

## 2010-07-09 LAB — CK TOTAL AND CKMB (NOT AT ARMC)
CK, MB: 2.5 ng/mL (ref 0.3–4.0)
Relative Index: INVALID (ref 0.0–2.5)
Total CK: 66 U/L (ref 7–177)

## 2010-07-09 LAB — GLUCOSE, CAPILLARY: Glucose-Capillary: 178 mg/dL — ABNORMAL HIGH (ref 70–99)

## 2010-07-09 LAB — DIFFERENTIAL
Basophils Relative: 0 % (ref 0–1)
Eosinophils Relative: 1 % (ref 0–5)
Lymphocytes Relative: 12 % (ref 12–46)
Monocytes Relative: 7 % (ref 3–12)
Neutrophils Relative %: 80 % — ABNORMAL HIGH (ref 43–77)

## 2010-07-10 ENCOUNTER — Inpatient Hospital Stay (HOSPITAL_COMMUNITY): Payer: Medicare Other

## 2010-07-10 LAB — CBC
HCT: 22.1 % — ABNORMAL LOW (ref 36.0–46.0)
MCH: 24.8 pg — ABNORMAL LOW (ref 26.0–34.0)
MCHC: 32.1 g/dL (ref 30.0–36.0)
MCV: 77.3 fL — ABNORMAL LOW (ref 78.0–100.0)
RDW: 15.9 % — ABNORMAL HIGH (ref 11.5–15.5)

## 2010-07-10 LAB — COMPREHENSIVE METABOLIC PANEL
ALT: 7 U/L (ref 0–35)
AST: 9 U/L (ref 0–37)
Alkaline Phosphatase: 77 U/L (ref 39–117)
CO2: 24 mEq/L (ref 19–32)
Chloride: 111 mEq/L (ref 96–112)
Creatinine, Ser: 2.72 mg/dL — ABNORMAL HIGH (ref 0.50–1.10)
GFR calc non Af Amer: 17 mL/min — ABNORMAL LOW (ref >60)
Total Bilirubin: 0.3 mg/dL (ref 0.3–1.2)

## 2010-07-10 LAB — PHOSPHORUS: Phosphorus: 3.9 mg/dL (ref 2.3–4.6)

## 2010-07-10 LAB — URINALYSIS, MICROSCOPIC ONLY
Hgb urine dipstick: NEGATIVE
Protein, ur: 100 mg/dL — AB
Urobilinogen, UA: 0.2 mg/dL (ref 0.0–1.0)

## 2010-07-10 LAB — GLUCOSE, CAPILLARY: Glucose-Capillary: 115 mg/dL — ABNORMAL HIGH (ref 70–99)

## 2010-07-11 LAB — BASIC METABOLIC PANEL
Chloride: 107 mEq/L (ref 96–112)
GFR calc Af Amer: 19 mL/min — ABNORMAL LOW (ref >60)
GFR calc non Af Amer: 16 mL/min — ABNORMAL LOW (ref >60)
Potassium: 5.5 mEq/L — ABNORMAL HIGH (ref 3.5–5.1)
Sodium: 139 mEq/L (ref 135–145)

## 2010-07-11 LAB — GLUCOSE, CAPILLARY
Glucose-Capillary: 104 mg/dL — ABNORMAL HIGH (ref 70–99)
Glucose-Capillary: 135 mg/dL — ABNORMAL HIGH (ref 70–99)
Glucose-Capillary: 181 mg/dL — ABNORMAL HIGH (ref 70–99)

## 2010-07-11 LAB — PREPARE RBC (CROSSMATCH)

## 2010-07-12 LAB — BASIC METABOLIC PANEL
BUN: 56 mg/dL — ABNORMAL HIGH (ref 6–23)
Chloride: 106 mEq/L (ref 96–112)
Creatinine, Ser: 2.77 mg/dL — ABNORMAL HIGH (ref 0.50–1.10)
GFR calc Af Amer: 20 mL/min — ABNORMAL LOW (ref >60)
GFR calc non Af Amer: 16 mL/min — ABNORMAL LOW (ref >60)
Potassium: 4.9 mEq/L (ref 3.5–5.1)

## 2010-07-12 LAB — CBC
MCHC: 33.1 g/dL (ref 30.0–36.0)
MCV: 77.8 fL — ABNORMAL LOW (ref 78.0–100.0)
Platelets: 100 10*3/uL — ABNORMAL LOW (ref 150–400)
RDW: 15.8 % — ABNORMAL HIGH (ref 11.5–15.5)
WBC: 5.1 10*3/uL (ref 4.0–10.5)

## 2010-07-12 LAB — GLUCOSE, CAPILLARY: Glucose-Capillary: 118 mg/dL — ABNORMAL HIGH (ref 70–99)

## 2010-07-12 LAB — TYPE AND SCREEN
Antibody Screen: NEGATIVE
Unit division: 0

## 2010-07-12 LAB — PRO B NATRIURETIC PEPTIDE: Pro B Natriuretic peptide (BNP): 8789 pg/mL — ABNORMAL HIGH (ref 0–450)

## 2010-07-12 LAB — URINE CULTURE

## 2010-07-13 LAB — CBC
HCT: 25.8 % — ABNORMAL LOW (ref 36.0–46.0)
MCH: 25.5 pg — ABNORMAL LOW (ref 26.0–34.0)
MCHC: 32.6 g/dL (ref 30.0–36.0)
MCV: 78.2 fL (ref 78.0–100.0)
Platelets: 123 10*3/uL — ABNORMAL LOW (ref 150–400)
RDW: 15.7 % — ABNORMAL HIGH (ref 11.5–15.5)
WBC: 4.6 10*3/uL (ref 4.0–10.5)

## 2010-07-13 LAB — BASIC METABOLIC PANEL
BUN: 60 mg/dL — ABNORMAL HIGH (ref 6–23)
Calcium: 8.4 mg/dL (ref 8.4–10.5)
GFR calc non Af Amer: 16 mL/min — ABNORMAL LOW (ref >60)
Glucose, Bld: 107 mg/dL — ABNORMAL HIGH (ref 70–99)

## 2010-07-13 LAB — GLUCOSE, CAPILLARY
Glucose-Capillary: 151 mg/dL — ABNORMAL HIGH (ref 70–99)
Glucose-Capillary: 140 mg/dL — ABNORMAL HIGH (ref 70–99)

## 2010-07-14 LAB — BASIC METABOLIC PANEL
CO2: 31 mEq/L (ref 19–32)
Calcium: 8.4 mg/dL (ref 8.4–10.5)
Chloride: 105 mEq/L (ref 96–112)
GFR calc Af Amer: 21 mL/min — ABNORMAL LOW (ref >60)
Sodium: 141 mEq/L (ref 135–145)

## 2010-07-14 LAB — GLUCOSE, CAPILLARY: Glucose-Capillary: 74 mg/dL (ref 70–99)

## 2010-07-14 LAB — CBC
MCH: 25.4 pg — ABNORMAL LOW (ref 26.0–34.0)
Platelets: 153 10*3/uL (ref 150–400)
RBC: 3.54 MIL/uL — ABNORMAL LOW (ref 3.87–5.11)
RDW: 15.6 % — ABNORMAL HIGH (ref 11.5–15.5)
WBC: 4.6 10*3/uL (ref 4.0–10.5)

## 2010-07-17 ENCOUNTER — Encounter (INDEPENDENT_AMBULATORY_CARE_PROVIDER_SITE_OTHER): Payer: Medicare Other

## 2010-07-17 ENCOUNTER — Ambulatory Visit (INDEPENDENT_AMBULATORY_CARE_PROVIDER_SITE_OTHER): Payer: Medicare Other

## 2010-07-17 DIAGNOSIS — N186 End stage renal disease: Secondary | ICD-10-CM

## 2010-07-17 DIAGNOSIS — N184 Chronic kidney disease, stage 4 (severe): Secondary | ICD-10-CM

## 2010-07-17 NOTE — Assessment & Plan Note (Signed)
OFFICE VISIT  Paula, Wood DOB:  15-Apr-1925                                       07/17/2010 [id number]  Dr. Scot Dock is the attending physician in the office today.  The patient is an 77 year old woman who is not yet on hemodialysis who had a left brachiocephalic arteriovenous fistula placed on 05/19/2010. The patient has been doing well.  She denies any symptoms of steal.  She has good strength, motion and sensation in the left upper extremity and can use it without difficulty.  She denies any pain in the arm or hand as well.  PHYSICAL EXAM:  General:  This is a well-developed, overweight woman in no acute distress.  Vital signs:  Her sats are 97%, respiratory rate was 12 and her heart rate was 80.  Vascular lab:  Left arm fistula duplex showed the diameter to be greater than 0.6 cm throughout.  The depth in proximal brachium was 1 cm and it was greater than 0.4 cm at the antecubital fossa, distal brachial and mid brachium.  She had one branch noted.  She had a good thrill and bruit and the fistula was easily palpable for about 8 cm.  She had a palpable radial pulse.  She had good motion and sensation in the left hand with a 5/5 grip.  Her wounds were healing well.  ASSESSMENT:  Maturing brachiocephalic fistula in the left upper extremity on this patient who is not yet on hemodialysis.  This may or may not prove to be mildly deep for cannulation, however, will have the patient come back in a month to see Dr. Bridgett Larsson to see how well this is maturing.  He will review the vascular studies at that time.  Wray Kearns, PA-C  Conrad [place], MD Electronically Signed  RR/MEDQ  D:  07/17/2010  T:  07/17/2010  Job:  [id number]

## 2010-07-23 NOTE — Procedures (Unsigned)
VASCULAR LAB EXAM  INDICATION:  Followup left arm fistula placement.  HISTORY: Chronic kidney disease, left brachiocephalic fistula placement. Diabetes: Cardiac: Hypertension:  EXAM:  IMPRESSION:  Patent left brachiocephalic arteriovenous fistula with diameter, depth and velocity measurements noted on the following worksheet.  ___________________________________________ Conrad Malvern, MD  EM/MEDQ  D:  07/17/2010  T:  07/17/2010  Job:  [id number]

## 2010-08-01 NOTE — Discharge Summary (Signed)
NAME:  [name], [name]              ACCOUNT NO.:  [id number]  MEDICAL RECORD NO.:  [id number]  LOCATION:  A306                          FACILITY:  [facility]  PHYSICIAN:  Rey Fors L. Conley Canal, MDDATE OF BIRTH:  21-May-1925  DATE OF ADMISSION:  05/26/2010 DATE OF DISCHARGE:  05/19/2012LH                              DISCHARGE SUMMARY   DISCHARGE DIAGNOSES: 1. Acute on chronic anemia. 2. Chronic anemia secondary to iron deficiency, chronic disease,     chronic renal failure. 3. Acute renal insufficiency. 4. Chronic kidney disease, stage IV. 5. Hypertension. 6. History of stroke with right hemiparesis. 7. Diabetes type 2. 8. History of uterine cancer status post hysterectomy. 9. History of morbid obesity 10.Diastolic heart failure, compensated. 11.Malignant hypertension. 12.Hyperlipidemia. 13.History of gout. 14.Depression. 15.History of bilateral cataracts and glaucoma. 16.Colon polyps, status post cold biopsy. 17.Klebsiella urinary tract infection. 18.Debility.  DISCHARGE MEDICATIONS: 1. Cephalexin 500 mg p.o. b.i.d. 2. 70/30 insulin was changed to 8 units subcutaneously in the morning,     4 units in the evening. 3. Amlodipine 10 mg a day. 4. Aranesp monthly per Dr. Lowanda Foster. 5. Hold aspirin until Jun 01, 2010, then resume 81 mg a day. 6. Carvedilol 12.5 mg twice a day. 7. Citalopram 20 mg a day. 8. Gabapentin 600 mg nightly. 9. Guanfacine 2 mg nightly. 10.Hydralazine 50 mg p.o. t.i.d. 11.Imdur 40 mg a day. 12.Lasix 80 mg twice a day. 13.Nexium 40 mg a day. 14.Nu-Iron 150 mg a day. 15.Simvastatin 10 mg a day.  CONDITION:  Stable.  ACTIVITY:  Increase slowly.  DIET:  Should be low-sodium, heart-healthy diabetic.  Follow up with Dr. Tressie Stalker, call for appointment for refractory anemia.  CONSULTATIONS: 1. Alison Murray, MD 2. Hildred Laser, MD  PROCEDURES:  Colonoscopy which showed cecal polyps and diverticulum.  LABS ON ADMISSION:  CBC significant for  hemoglobin of 7.2, hematocrit 22, platelet count 123.  She was transfused packed red blood cells and at discharge, her hemoglobin is 10.3.  Basic metabolic panel on admission significant for a glucose of 177, BUN 73, creatinine 3.38.  At discharge, BUN is 62, creatinine 3.07.  PT/PTT normal.  Liver function tests significant for an albumin of 3.2, phosphorus was 5, LDH 182, haptoglobin 159 which is normal.  Urinalysis showed specific gravity of 1.020, pH 5.5, small hemoglobin, 100 protein, negative nitrite, large leukocyte esterase, too numerous to count white cells, 3-6 red cells, many bacteria.  Urine culture grew out greater than 100,000 colonies of Klebsiella pneumonia which was resistant to ampicillin, intermediate to nitrofurantoin and sensitive to all else.  DIAGNOSTICS:  Chest x-ray showed cardiomegaly and pulmonary vascular congestion with bibasilar atelectasis.  EKG showed sinus bradycardia with a rate of 57, inferior and anterior, anterolateral T-wave abnormalities.  HISTORY AND HOSPITAL COURSE:  Please see H and P for complete admission details.  [name] is a pleasant 77 year old black female with multiple medical problems including chronic anemia.  She was sent to the emergency room by her physician who noted her hemoglobin was 7.8.  She was very weak.  Initially, she reported no hematochezia or other bleeding.  She subsequently did admit to melena several weeks prior to admission.  She had a  heart rate of 58, blood pressure 145/59, otherwise normal vital signs.  She was, on physical exam, noted to be obese, otherwise fairly unremarkable exam.  She has a history of chronic kidney disease which was worsened acutely.  She also was found to have evidence of urinary tract infection.  She was admitted to the Hospitalist Service and started on antibiotics and given a blood transfusion.  Nephrology was consulted.  She had been placed on an erythropoietin product as  an outpatient but apparently missed a lot of her appointments.  GI was consulted and did a colonoscopy which showed polyps.  There was no active bleeding and she was heme-negative on stool testing.  At the time of discharge, she felt much better.  She would qualify for skilled nursing facility but she declined.  Total time on the day of discharge was greater than 30 minutes.     Arshawn Valdez L. Conley Canal, MD     CLS/MEDQ  D:  07/31/2010  T:  08/01/2010  Job:  [id number]

## 2010-08-05 NOTE — Discharge Summary (Signed)
NAMEMarland Kitchen  DEANN, RUSSO NO.:  [id number]  MEDICAL RECORD NO.:  [id number]  LOCATION:  4705                         FACILITY:  [facility]  PHYSICIAN:  Verlee Monte, MD       DATE OF BIRTH:  26-Aug-1925  DATE OF ADMISSION:  07/09/2010 DATE OF DISCHARGE:  07/14/2010                              DISCHARGE SUMMARY   PRIMARY CARE PHYSICIAN:  Dr. Nevada Crane in [place].  PRIMARY NEPHROLOGIST:  Alison Murray, MD  REASON FOR ADMISSION:  Shortness of breath.  DISCHARGE DIAGNOSES: 1. Mild acute-on-chronic diastolic congestive heart failure. 2. Klebsiella pneumoniae urinary tract infection. 3. Chronic kidney disease stage IV. 4. Chronic anemia. 5. Insulin dependent diabetes mellitus. 6. Acute-on-chronic anemia. 7. Insulin-dependent diabetes mellitus. 8. Hypertension. 9. Cerebrovascular accident in the past. 10.History of a stroke with no residual deficits. 11.History of sinus bradycardia. 12.Hyperlipidemia. 13.Depression. 14.Morbid obesity. 15.Cataract with glaucoma.  DISCHARGE MEDICATIONS: 1. Ciprofloxacin 500 mg p.o. daily for 4 more days. 2. Furosemide 40 mg p.o. daily. 3. Amlodipine 10 mg p.o. daily. 4. Aspirin 81 mg p.o. daily. 5. Carvedilol 12.5 mg p.o. b.i.d. 6. Citalopram 20 mg p.o. daily. 7. Gabapentin 600 mg p.o. nightly. 8. Guanfacine 2 mg nightly. 9. Hydralazine 50 mg every 8 hours. 10.Isosorbide dinitrate 40 mg three times a day. 11.Nexium 40 mg p.o. daily. 12.NovoLog mix 70/30, 8 units in the morning and 5 units in the p.m. 13.Nu-Iron 150 mg p.o. daily. 14.Simvastatin 10 mg p.o. daily at bedtime. 15.Tylenol Extra Strength 500 mg 2 tablets daily as needed for     arthritis pain.  RADIOLOGY: 1. Chest x-ray, July 10, 2010 showed chronic cardiomegaly and     elevation of the left hemidiaphragm, pulmonary vascular congestion,     cannot exclude interstitial edema. 2. Chest x-ray, July 09, 2010 showed congestive heart failure with     bibasilar  atelectasis similar to the recent study.  BRIEF HISTORY/EXAMINATION:  Ms. Zaremba is an 77 year old African American female with past medical history of chronic kidney disease, stage IV hypertension, and diabetes.  The patient came to the hospital complaining about shortness of breath.  The patient was recently at [facility], admitted from June 17, 2010 to June 20, 2010 for UTI and generalized weakness.  The patient was discharged home.  The patient was discharged home and she was doing fine until one day prior to admission.  The patient came in complaining about shortness of breath. The patient said the shortness of breath started one day prior to admission, was progressive since then and to the point she could not breathe.  The patient denies cough.  Denies sputum production.  Denies chest pain.  Denies palpitations.  Denies wheezing.  Upon initial evaluation in the emergency department, the patient was found to have hemoglobin of 7.3.  BRIEF HOSPITAL COURSE: 1. Acute-on-chronic diastolic heart failure.  This patient admitted to     the hospital for further evaluation was put on telemetry bed.  Her     chest x-ray showed interstitial edema and BNP of 9700.  The patient     was started on IV Lasix.  Her beta-blockers, hydralazine, and     nitrate-providing medication, her dinitrate  was continued.  The     patient was having good urine output.  Her kidney function did not     go up.  As a matter of fact, it was improved since admission.  It     was about the same since admission.  The patient came in with     creatinine of 2.5 and discharge date creatinine was 2.6.  The     patient will be discharged home on Lasix 40 mg p.o. daily.  The     patient to follow up with Dr. Lowanda Foster for further adjustment of     her Lasix. 2. Anemia.  The patient has chronic anemia secondary to chronic kidney     disease.  The patient is taking iron supplements.  Probably the     patient needs  erythropoietin supplementation as well.  The patient     to follow up with Dr. Lowanda Foster.  The patient came in with a     hemoglobin of 7.3 and went down to 7.1.  The patient got     transfusion of 2 units and her hemoglobin went up to 8.4 and on at     day of discharge it was 9.0.  Her hemoglobin is stable.  The     patient does not have any evidence of melena or other blood losing     problems.  The patient needs to follow-up with nephrologist,     probably needs erythropoietin supplementation along with the iron. 3. Klebsiella pneumoniae UTI.  The patient was started on     ciprofloxacin and it was adjusted to her renal function.  The     patient put on 500 mg daily for 5 days. 4. Chronic kidney disease stage IV.  Her creatinine baseline about     2.5.  The patient was on Lasix.  Recently that was held because of     increasing creatinine.  The dose was put back to 40 mg p.o. daily.     The patient to follow up with nephrologist for further adjustment     of the dose. 5. Diabetes mellitus type 2.  Her medication was continued throughout     the hospital stay without any changes.  Recent hemoglobin A1c on     June 17, 2010 showed hemoglobin A1c of 5.7 indicating good glycemic     control. 6. Hypertension.  This is controlled.  No changes were done during the     hospital stay to her medications.  DISCHARGE INSTRUCTIONS: 1. Activity as tolerated. 2. Diet.  Her diet is a very restricted.  Should be a heart-healthy     renal and carbohydrate-modified diet. 3. Activity as tolerated.  DISPOSITION:  Home with home health service.  Again, the patient evaluated by PT/OT.  Last time she was in the hospital, they did recommend to go for SNF.  The patient is refusing and saying she got good care at home.  The patient was felt to be safe for discharge today.  The patient told to follow up with her primary care physician as well as her nephrologist as soon as possible and she  voiced understanding.     Verlee Monte, MD     ME/MEDQ  D:  07/14/2010  T:  07/15/2010  Job:  [id number]  cc:   Alison Murray, M.D. Delphina Cahill  Electronically Signed by Verlee Monte  on 08/05/2010 07:58:17 PM

## 2010-08-05 NOTE — H&P (Signed)
NAME:  Paula, Wood NO.:  [id number]  MEDICAL RECORD NO.:  [id number]  LOCATION:                                 FACILITY:  PHYSICIAN:  Verlee Monte, MD       DATE OF BIRTH:  [birth year]  DATE OF ADMISSION: DATE OF DISCHARGE:                             HISTORY & PHYSICAL   PRIMARY CARE PHYSICIAN:  Dr. Nevada Crane in [place], [place].  PRIMARY NEPHROLOGIST:  Alison Murray, M.D.  REASON FOR ADMISSION:  Shortness of breath.  HISTORY OF PRESENT ILLNESS:  [name] is an 77 year old African American female with past medical history of chronic kidney disease stage IV, hypertension, and diabetes.  The patient came in to the hospital complaining about shortness of breath.  The patient was recently in [facility] admitted from June 17, 2010 to June 20, 2010, for UTI and generalized debility.  The patient was discharged home and she was doing fine since then.  The patient came in back to the hospital complaining about shortness of breath.  The patient said that shortness of breath started yesterday, it was progressing since then to the point she could not breathe, so that is why she came in to the hospital seeking the medical advice.  The patient denies cough, denies sputum production, denies chest pain, denies palpitations, and denies wheezing.  Upon initial evaluation in the Emergency Department, the patient was found to have hemoglobin of 7.3 and BNP of 13086.  The patient admitted for further evaluation.  PAST MEDICAL HISTORY: 1. Chronic kidney disease stage IV. 2. Chronic anemia. 3. Insulin-dependent diabetes mellitus. 4. Hypertension. 5. CVA in the past. 6. Diastolic heart failure. 7. Stroke with no residual deficits. 8. History of sinus bradycardia. 9. Hyperlipidemia. 10.Depression. 11.Morbid obesity. 12.Cataract with glaucoma.  SOCIAL HISTORY:  The patient lives alone.  She has 7 children.  The patient use a walker at  home.  MEDICATIONS:  Please see the admission medication sheet.  FAMILY HISTORY:  Unremarkable.  REVIEW OF SYSTEMS:  GENERAL:  The patient denies fever or chills. HEENT:  Denies headache.  Denies nasal discharge. SKIN:  Denies rash or lesion. CARDIAC:  Denies chest pain or PND. PULMONARY:  Denies shortness of breath, but denies cough or wheezing. GU:  Denies frequency, dysuria, or straining. NEURO/PSYCH:  Denies weakness, numbness, or mood disturbances. MUSCULOSKELETAL:  Denies joint swelling or deformity. GI:  Denies nausea, vomiting, or diarrhea. ENDOCRINE:  Denies polyuria or polydipsia.  PHYSICAL EXAMINATION:  VITAL SIGNS:  Temperature is 97.8, respirations 12, pulse is 62, and blood pressure is 156/56. GENERAL:  The patient is well-developed, well-nourished African American female, wearing the hospital gown in no acute distress laying on her back. HEAD AND FACE:  Normocephalic, atraumatic. EYES:  Pupils are equal and reactive to light and accommodation.  Normal appearance. ENT:  Ears, nose, and throat normal. NECK:  Supple.  Full range of motion. CARDIOVASCULAR:  Regular rate and rhythm.  No murmurs, rubs, or gallops. RESPIRATORY:  Normal breath sounds.  Clear to auscultation bilaterally. CHEST:  Nontender. ABDOMEN:  Bowel sounds heard.  Soft.  Nontender or distended. EXTREMITIES:  Normal.  There is trace pitting edema bilaterally,  worse in the right side. NEUROLOGIC:  Alert, awake, and oriented x3.  Cranial nerves II through XII are grossly intact.  RADIOLOGY DATA:  Chest x-ray showed congestive heart failure with bibasilar atelectasis similar to the recent study.  LABORATORY DATA: 1. CBC; WBC 6.6, hemoglobin 7.3, hematocrit 22.9, and platelets is     106. 2. Cardiac enzymes CK 66, CK-MB 2.5, and troponin is less than 0.3. 3. BMP; sodium 141, potassium 5.7, chloride 110, bicarb is 25, glucose     155, BUN is 53, creatinine is 2.5, and calcium is  7.5.  ASSESSMENT/PLAN: 1. Shortness of breath.  The patient did have history of diastolic     congestive heart failure and because of chronic kidney disease, the     patient likely to have exacerbation of her diastolic heart failure.     Last time she was in the hospital, Lasix was held upon discharge.     We will restart Lasix and measure the inputs and outputs     accurately.  We will follow up with serial chest x-rays as well as     BNP. 2. Anemia.  The patient has chronic anemia because of chronic kidney     disease.  The patient has been following with nephrologist for     that.  The patient is taking iron supplements and erythropoietin     analogs supplements.  I will defer the transfusion for now if the     patient needs.  I will check the CBC in the morning if she still     has significant drop in hemoglobin, we will transfuse. 3. Diabetes mellitus type 2.  The patient will be on insulin sliding     scale as well as a carbohydrate-modified diet for her diabetes.     Hemoglobin checked on June 17, 2010 was 5.7.  Her diabetes is well-     controlled. 4. Chronic kidney disease stage IV.  The patient's baseline creatinine     is about 3.9, last time she was admitted from the hospital.  Today     is 2.4.  This is probably is going to increase with when we started     the Lasix.  But, we will titrate the Lasix and the kidney function     for the comforts in terms of shortness of breath.  I expect the     creatinine to go up, I will consult Nephrology. 5. Mild hyperkalemia at 5.7.  The patient does not have any EKG     changes.  This is probably because of the chronic kidney disease.     The patient will be restarted on Lasix and we will follow up on the     renal function as well as the potassium.     Verlee Monte, MD     ME/MEDQ  D:  07/09/2010  T:  07/09/2010  Job:  [id number]  cc:   Dr. Nevada Crane  Electronically Signed by Verlee Monte  on 08/05/2010 07:59:03 PM

## 2010-08-19 ENCOUNTER — Encounter (HOSPITAL_COMMUNITY): Payer: Medicare Other | Attending: Family Medicine

## 2010-08-19 DIAGNOSIS — D631 Anemia in chronic kidney disease: Secondary | ICD-10-CM | POA: Insufficient documentation

## 2010-08-19 DIAGNOSIS — N184 Chronic kidney disease, stage 4 (severe): Secondary | ICD-10-CM | POA: Insufficient documentation

## 2010-08-19 DIAGNOSIS — N19 Unspecified kidney failure: Secondary | ICD-10-CM

## 2010-08-19 DIAGNOSIS — N039 Chronic nephritic syndrome with unspecified morphologic changes: Secondary | ICD-10-CM | POA: Insufficient documentation

## 2010-08-19 DIAGNOSIS — I129 Hypertensive chronic kidney disease with stage 1 through stage 4 chronic kidney disease, or unspecified chronic kidney disease: Secondary | ICD-10-CM | POA: Insufficient documentation

## 2010-08-19 LAB — RENAL FUNCTION PANEL
BUN: 72 mg/dL — ABNORMAL HIGH (ref 6–23)
CO2: 22 mEq/L (ref 19–32)
Calcium: 9 mg/dL (ref 8.4–10.5)
Creatinine, Ser: 3.02 mg/dL — ABNORMAL HIGH (ref 0.50–1.10)
GFR calc Af Amer: 18 mL/min — ABNORMAL LOW (ref >60)
Glucose, Bld: 121 mg/dL — ABNORMAL HIGH (ref 70–99)
Phosphorus: 4.1 mg/dL (ref 2.3–4.6)
Sodium: 141 mEq/L (ref 135–145)

## 2010-08-19 MED ORDER — DARBEPOETIN ALFA-POLYSORBATE 60 MCG/0.3ML IJ SOLN
INTRAMUSCULAR | Status: AC
  Administered 2010-08-19: 60 ug via SUBCUTANEOUS
  Filled 2010-08-19: qty 0.3

## 2010-08-19 MED ORDER — DARBEPOETIN ALFA-POLYSORBATE 60 MCG/0.3ML IJ SOLN
60.0000 ug | Freq: Once | INTRAMUSCULAR | Status: AC
  Administered 2010-08-19: 60 ug via SUBCUTANEOUS

## 2010-08-19 NOTE — Progress Notes (Signed)
Venipuncture x 1 rt ac, labs drawn. Aranesp 73mcg subq rt upper ar. Tolerated all well.

## 2010-08-21 ENCOUNTER — Ambulatory Visit: Payer: Medicare Other | Admitting: Vascular Surgery

## 2010-08-26 ENCOUNTER — Encounter (HOSPITAL_BASED_OUTPATIENT_CLINIC_OR_DEPARTMENT_OTHER): Payer: Medicare Other

## 2010-08-26 VITALS — BP 127/55 | HR 53

## 2010-08-26 DIAGNOSIS — N039 Chronic nephritic syndrome with unspecified morphologic changes: Secondary | ICD-10-CM

## 2010-08-26 DIAGNOSIS — D631 Anemia in chronic kidney disease: Secondary | ICD-10-CM

## 2010-08-26 DIAGNOSIS — N189 Chronic kidney disease, unspecified: Secondary | ICD-10-CM

## 2010-08-26 MED ORDER — DARBEPOETIN ALFA-POLYSORBATE 60 MCG/0.3ML IJ SOLN
INTRAMUSCULAR | Status: AC
  Filled 2010-08-26: qty 0.3

## 2010-08-26 MED ORDER — DARBEPOETIN ALFA-POLYSORBATE 60 MCG/0.3ML IJ SOLN
60.0000 ug | Freq: Once | INTRAMUSCULAR | Status: AC
  Administered 2010-08-26: 60 ug via SUBCUTANEOUS

## 2010-08-28 ENCOUNTER — Ambulatory Visit (INDEPENDENT_AMBULATORY_CARE_PROVIDER_SITE_OTHER): Payer: Medicare Other | Admitting: Thoracic Diseases

## 2010-08-28 VITALS — HR 68 | Resp 18

## 2010-08-28 DIAGNOSIS — N186 End stage renal disease: Secondary | ICD-10-CM

## 2010-08-28 NOTE — Progress Notes (Signed)
[facility]  Postoperative Visit hemodialysis access  HPI: [name] is a 77 y.o. female who is 12 weeks S/P creation/revision of left upper extremity Hemodialysis access. The patient denies symptoms of numbness, tingling, weakness; denies pain in the operative limb. Patient is here for post -op evaluation to assess healing and maturation of left brachiocephalic AVF . Labs reviewed by Dr. Bridgett Larsson. Depth and size of AVF adequate.   Pt is not on hemodialysis  Physical Examination  Filed Vitals:   08/28/10 1252  Pulse: 68  Resp: 59    WDWN female in NAD. Pt. In wheelchair and on O2  left upper extremity Incision is clean, dry, intact Skin color is normal   Hand grip is 5/5 and sensation in digits is intact; There is a good thrill and good bruit in the LUA brachiocephalic AVF. The graft/fistula is easily palpable to mid upper extremity and of adequate size   Assessment/Plan [name] is a 77 y.o. year old female who presents s/p creation/revision of left upper Hemodialysis access. Follow-up in as needed  The patient's access will be ready for use in immediately. If having difficulty cannulating AVF may need to be superficialized.   Clinic MD: Trula Slade

## 2010-09-02 ENCOUNTER — Encounter (HOSPITAL_BASED_OUTPATIENT_CLINIC_OR_DEPARTMENT_OTHER): Payer: Medicare Other

## 2010-09-02 VITALS — BP 127/58 | HR 58 | Temp 98.2°F

## 2010-09-02 DIAGNOSIS — N039 Chronic nephritic syndrome with unspecified morphologic changes: Secondary | ICD-10-CM

## 2010-09-02 DIAGNOSIS — N189 Chronic kidney disease, unspecified: Secondary | ICD-10-CM

## 2010-09-02 MED ORDER — DARBEPOETIN ALFA-POLYSORBATE 60 MCG/0.3ML IJ SOLN
INTRAMUSCULAR | Status: AC
  Administered 2010-09-02: 60 ug via SUBCUTANEOUS
  Filled 2010-09-02: qty 0.3

## 2010-09-02 MED ORDER — DARBEPOETIN ALFA-POLYSORBATE 60 MCG/0.3ML IJ SOLN
60.0000 ug | Freq: Once | INTRAMUSCULAR | Status: AC
  Administered 2010-09-02: 60 ug via SUBCUTANEOUS

## 2010-09-02 NOTE — Progress Notes (Signed)
VSS. Aranesp 60 mcg subcutaneously upper rt arm. Tolerated well.

## 2010-09-04 ENCOUNTER — Encounter: Payer: Self-pay | Admitting: Family Medicine

## 2010-09-04 ENCOUNTER — Ambulatory Visit (INDEPENDENT_AMBULATORY_CARE_PROVIDER_SITE_OTHER): Payer: Medicare Other | Admitting: Family Medicine

## 2010-09-04 VITALS — BP 112/60 | HR 53 | Ht 65.0 in | Wt 204.0 lb

## 2010-09-04 DIAGNOSIS — R0609 Other forms of dyspnea: Secondary | ICD-10-CM

## 2010-09-04 DIAGNOSIS — R5383 Other fatigue: Secondary | ICD-10-CM

## 2010-09-04 DIAGNOSIS — D631 Anemia in chronic kidney disease: Secondary | ICD-10-CM

## 2010-09-04 DIAGNOSIS — S40029A Contusion of unspecified upper arm, initial encounter: Secondary | ICD-10-CM

## 2010-09-04 DIAGNOSIS — R0989 Other specified symptoms and signs involving the circulatory and respiratory systems: Secondary | ICD-10-CM

## 2010-09-04 DIAGNOSIS — R5381 Other malaise: Secondary | ICD-10-CM

## 2010-09-04 DIAGNOSIS — N039 Chronic nephritic syndrome with unspecified morphologic changes: Secondary | ICD-10-CM

## 2010-09-04 DIAGNOSIS — N189 Chronic kidney disease, unspecified: Secondary | ICD-10-CM

## 2010-09-04 DIAGNOSIS — I10 Essential (primary) hypertension: Secondary | ICD-10-CM

## 2010-09-04 LAB — COMPREHENSIVE METABOLIC PANEL
Albumin: 3.6 g/dL (ref 3.5–5.2)
BUN: 55 mg/dL — ABNORMAL HIGH (ref 6–23)
CO2: 28 mEq/L (ref 19–32)
Calcium: 8.8 mg/dL (ref 8.4–10.5)
Chloride: 110 mEq/L (ref 96–112)
Glucose, Bld: 84 mg/dL (ref 70–99)
Potassium: 4.9 mEq/L (ref 3.5–5.3)
Sodium: 145 mEq/L (ref 135–145)
Total Protein: 6.3 g/dL (ref 6.0–8.3)

## 2010-09-04 LAB — TSH: TSH: 1.698 u[IU]/mL (ref 0.350–4.500)

## 2010-09-04 NOTE — Patient Instructions (Signed)
I will not change any medications I will get records from Dr. Juel Burrow office Please bring in the form for her oxygen Please get the labwork done. We will cal with results Next visit in 2 weeks.

## 2010-09-04 NOTE — Progress Notes (Signed)
  Subjective:    Patient ID: [name], female    DOB: 1925/05/23, 77 y.o.   MRN: [id number]  HPI  Pt here to establish care. Her primary care provider is moving. Medications and history were reviewed   1. Fatigue- Has been fatigued the past few days, not sleeping well at night, she is getting shots ( for anemia)     2. Kidney Diease- Kidney doctor Dr.Befakadu- not on dialysis , recently had fistula placed   3. SOB-Chronic oxygen- she is not sure exactly why she is on this, but wears most hours of the day, hsitory of CHF and SOB with exertion,    Has a form at home that needs to be sent in for insurance purpoes   Neurology- previously seen pt, for ?seizure   4. Diabetes Mellitus- takes CBG twice a day, tolerating her medications   5. CHF- [facility]- cardiologist , she has [facility] which helps with vitals and weights, baseline 195-200lbs. Blood pressure ranges 130-160/ 50-80    CAP program- 6  Hours a day M-F, aide name is ERICA, also has an Aide on the weekend 6. Has bruise on left arm, with bump beneath it, it is improving and getting smaller, she does not recall any particular injury.   Review of Systems    GEN- + fatigue, denies fever, weight loss,weakness, recent illness HEENT- denies eye drainage, change in vision, nasal discharge, CVS- occ chest pain, palpitations, +leg edema  RESP- + SOB, cough, wheeze ABD- denies N/V, change in stools, abd pain MSK- denies joint pain,+muscle aches, injury Neuro- denies headache, occ dizziness, syncope, seizure activity      Objective:   Physical Exam  GEN- NAD, pleasant, alert and oriented, hard of hearing, ambulates with some assitance  HEENT- PERRL, EOMI, non icteric, MMM, oropharynx clear, nasal cannula  Neck- no carotid bruit  CVS- RRR, 3/6 SEM  RESP- mild basilar crackles, oxygen 89-90% with ambulation, no rhonchi, normal WOB at rest, increased WOB with exertion  ABD-soft, NT, ND EXT- 1+ edema Skin- LUE fistula +thrill,  +bruit,  Left forearm, small area of bruising,small cystic like lesion felt beneath bruise      Assessment & Plan:

## 2010-09-05 LAB — CBC
Hemoglobin: 9.5 g/dL — ABNORMAL LOW (ref 12.0–15.0)
Platelets: 143 10*3/uL — ABNORMAL LOW (ref 150–400)
RBC: 3.94 MIL/uL (ref 3.87–5.11)
WBC: 5 10*3/uL (ref 4.0–10.5)

## 2010-09-07 ENCOUNTER — Telehealth: Payer: Self-pay | Admitting: Family Medicine

## 2010-09-07 DIAGNOSIS — S40029A Contusion of unspecified upper arm, initial encounter: Secondary | ICD-10-CM | POA: Insufficient documentation

## 2010-09-07 NOTE — Assessment & Plan Note (Signed)
I do believe she does require oxygen. He is likely a mix between her CHF and other medical problems. With her ambulatory O2 desats we will continue her home O2

## 2010-09-07 NOTE — Assessment & Plan Note (Signed)
No change to medications

## 2010-09-07 NOTE — Assessment & Plan Note (Addendum)
Patient has a bruise on the forearm with a cystic-like lesion felt the needed. As this is already resolving I will keep and eye on  this. We will recheck in 2 weeks.

## 2010-09-07 NOTE — Assessment & Plan Note (Signed)
Injections per renal. I will check her CBC baseline appears to be between 8 and 9

## 2010-09-07 NOTE — Assessment & Plan Note (Signed)
Unclear cause of fatigue. Patient has multiple comorbidities which are likely contributing. She has end-stage renal disease as well as CHF and is O2 dependent. I will check her anemia levels that she has anemia of renal disease. She is currently getting Aranesp. I will also check her thyroid levels and her electrolytes.

## 2010-09-08 NOTE — Telephone Encounter (Signed)
Called patient, left message.

## 2010-09-09 ENCOUNTER — Encounter (HOSPITAL_COMMUNITY): Payer: Medicare Other

## 2010-09-09 VITALS — BP 131/69 | HR 53

## 2010-09-09 DIAGNOSIS — N039 Chronic nephritic syndrome with unspecified morphologic changes: Secondary | ICD-10-CM

## 2010-09-09 MED ORDER — DARBEPOETIN ALFA-POLYSORBATE 60 MCG/0.3ML IJ SOLN
INTRAMUSCULAR | Status: AC
  Filled 2010-09-09: qty 0.3

## 2010-09-09 MED ORDER — DARBEPOETIN ALFA-POLYSORBATE 60 MCG/0.3ML IJ SOLN
60.0000 ug | Freq: Once | INTRAMUSCULAR | Status: AC
  Administered 2010-09-09: 60 ug via SUBCUTANEOUS

## 2010-09-09 NOTE — Progress Notes (Signed)
VSS. Aranesp 60 mcg given subcutaneoulsy lower rt abd.  Tolerated well.

## 2010-09-09 NOTE — Telephone Encounter (Signed)
Called patient, no answer 

## 2010-09-10 NOTE — Telephone Encounter (Signed)
The aide was suppose to bring in a form from [facility]. You can call and give them the pt name, She does require Oxygen. They can fax me the order form. They will not take a verbal for this

## 2010-09-10 NOTE — Telephone Encounter (Signed)
Aide states Dr needs to call [facility] and tell them patient still needs oxygen. If they do not receive order from Dr, patient will have to pay out of pocket.

## 2010-09-15 NOTE — Telephone Encounter (Signed)
Spoke with [facility], no paperwork needed, oxygen was refilled

## 2010-09-16 ENCOUNTER — Encounter (HOSPITAL_COMMUNITY): Payer: Medicare Other | Attending: Family Medicine

## 2010-09-16 ENCOUNTER — Encounter (HOSPITAL_COMMUNITY): Payer: Medicare HMO

## 2010-09-16 DIAGNOSIS — D631 Anemia in chronic kidney disease: Secondary | ICD-10-CM | POA: Insufficient documentation

## 2010-09-16 DIAGNOSIS — N185 Chronic kidney disease, stage 5: Secondary | ICD-10-CM | POA: Insufficient documentation

## 2010-09-16 DIAGNOSIS — N189 Chronic kidney disease, unspecified: Secondary | ICD-10-CM | POA: Insufficient documentation

## 2010-09-16 DIAGNOSIS — N039 Chronic nephritic syndrome with unspecified morphologic changes: Secondary | ICD-10-CM | POA: Insufficient documentation

## 2010-09-16 MED ORDER — DARBEPOETIN ALFA-POLYSORBATE 60 MCG/0.3ML IJ SOLN
60.0000 ug | Freq: Once | INTRAMUSCULAR | Status: AC
  Administered 2010-09-16: 60 ug via SUBCUTANEOUS

## 2010-09-16 MED ORDER — DARBEPOETIN ALFA-POLYSORBATE 60 MCG/0.3ML IJ SOLN
INTRAMUSCULAR | Status: AC
  Administered 2010-09-16: 60 ug via SUBCUTANEOUS
  Filled 2010-09-16: qty 0.3

## 2010-09-16 NOTE — Progress Notes (Signed)
VSS. Aranesp 60 mcg given subcutaneous tort. Upper arm. Tolerated well.

## 2010-09-18 ENCOUNTER — Ambulatory Visit (INDEPENDENT_AMBULATORY_CARE_PROVIDER_SITE_OTHER): Payer: Medicare Other | Admitting: Family Medicine

## 2010-09-18 ENCOUNTER — Encounter: Payer: Self-pay | Admitting: Family Medicine

## 2010-09-18 VITALS — BP 140/62 | HR 55 | Resp 16 | Ht 66.0 in | Wt 205.1 lb

## 2010-09-18 DIAGNOSIS — D631 Anemia in chronic kidney disease: Secondary | ICD-10-CM

## 2010-09-18 DIAGNOSIS — R5383 Other fatigue: Secondary | ICD-10-CM

## 2010-09-18 DIAGNOSIS — N189 Chronic kidney disease, unspecified: Secondary | ICD-10-CM

## 2010-09-18 DIAGNOSIS — S40029A Contusion of unspecified upper arm, initial encounter: Secondary | ICD-10-CM

## 2010-09-18 DIAGNOSIS — R5381 Other malaise: Secondary | ICD-10-CM

## 2010-09-18 DIAGNOSIS — K635 Polyp of colon: Secondary | ICD-10-CM

## 2010-09-18 DIAGNOSIS — D126 Benign neoplasm of colon, unspecified: Secondary | ICD-10-CM

## 2010-09-18 NOTE — Progress Notes (Signed)
  Subjective:    Patient ID: [name], female    DOB: 07-Jan-1926, 77 y.o.   MRN: [id number]  HPI  Pt here to f/u fatigue, feels okay overall had a busy weekend with her family. States she did notice dark stool last week or so.   Black stools- had at least 2 episodes, 1 recently, taking iron tablets, no abd pain, colonoscopy May 2012, showed polyps- no high grade dysplasia    Bruise on left arm- brusing improved, nodule still present non tender    Received oxygen refill at home  Review of Systems    GEN- no fever, cough, recent illness, +fatigue    CVS- no CP    RESP- no SOB currently, no cough    GI- per above    Objective:   Physical Exam GEN- NAD, pleasant, alert and oriented, hard of hearing, in wheelchair today  CVS- RRR, 3/6 SEM  RESP- mild basilar crackles, oxygen 89-90% with ambulation, no rhonchi, normal WOB at rest, EXT- 1+ edema Skin- LUE fistula +thrill, +bruit,  Left forearm, minimal bruising,small cystic like lesion felt beneath bruise-< 0.5cm, mobile  Rectal- external skin tags, soft brown stool in vault FOBT- neg     Assessment & Plan:

## 2010-09-18 NOTE — Patient Instructions (Signed)
Continue current mediciations Watch the area on your arm Your labs were okay F/U kidney doctor next month Next visit in November

## 2010-09-20 DIAGNOSIS — K635 Polyp of colon: Secondary | ICD-10-CM | POA: Insufficient documentation

## 2010-09-20 NOTE — Assessment & Plan Note (Signed)
Bruising has improved, she does have a small cystic like lesion, currently it is not causing any pain, she does not want to intervene at this time, her fistula is above this, will monitor for signs of infection or swelling, if needed will obtain ultrasound

## 2010-09-20 NOTE — Assessment & Plan Note (Signed)
Reviewed path and Colonoscopy in May, neg FOBT today, Hb reassuring. At this point we will monitor her. She denies BRBPR, she is on iron this may have been culprit.

## 2010-09-20 NOTE — Assessment & Plan Note (Signed)
Likley multifactorial, pt in no distress today, looks well overall. Labs unremarkable. Aide has no concerns, she did well with her family events this past weekend

## 2010-09-20 NOTE — Assessment & Plan Note (Signed)
Reviewed labs, Hb above baseline

## 2010-09-21 ENCOUNTER — Encounter: Payer: Self-pay | Admitting: Family Medicine

## 2010-09-23 ENCOUNTER — Encounter (HOSPITAL_COMMUNITY): Payer: Medicare Other

## 2010-09-23 VITALS — BP 139/61 | HR 55

## 2010-09-23 DIAGNOSIS — D631 Anemia in chronic kidney disease: Secondary | ICD-10-CM

## 2010-09-23 MED ORDER — DARBEPOETIN ALFA-POLYSORBATE 60 MCG/0.3ML IJ SOLN
INTRAMUSCULAR | Status: AC
  Filled 2010-09-23: qty 0.3

## 2010-09-23 MED ORDER — DARBEPOETIN ALFA-POLYSORBATE 500 MCG/ML IJ SOLN
60.0000 ug | Freq: Once | INTRAMUSCULAR | Status: AC
  Administered 2010-09-23: 60 ug via SUBCUTANEOUS

## 2010-09-23 NOTE — Progress Notes (Signed)
VSS. Aranesp 60 mcg subcutaneous to rt upper arm.  Tolerated well

## 2010-09-30 ENCOUNTER — Encounter (HOSPITAL_COMMUNITY): Payer: Medicare Other

## 2010-09-30 VITALS — BP 151/64 | HR 54 | Temp 98.4°F

## 2010-09-30 DIAGNOSIS — D631 Anemia in chronic kidney disease: Secondary | ICD-10-CM

## 2010-09-30 LAB — BASIC METABOLIC PANEL
BUN: 16
Chloride: 103
GFR calc Af Amer: 37 — ABNORMAL LOW
GFR calc non Af Amer: 30 — ABNORMAL LOW
Potassium: 4

## 2010-09-30 LAB — DIFFERENTIAL
Basophils Absolute: 0
Basophils Relative: 0
Lymphocytes Relative: 23
Monocytes Relative: 7
Neutro Abs: 6.9
Neutrophils Relative %: 69

## 2010-09-30 LAB — CBC
MCHC: 31.5
RBC: 4.68
RDW: 17.6 — ABNORMAL HIGH

## 2010-09-30 MED ORDER — DARBEPOETIN ALFA-POLYSORBATE 60 MCG/0.3ML IJ SOLN
INTRAMUSCULAR | Status: AC
  Filled 2010-09-30: qty 0.3

## 2010-09-30 MED ORDER — DARBEPOETIN ALFA-POLYSORBATE 200 MCG/0.4ML IJ SOLN
150.0000 ug | Freq: Once | INTRAMUSCULAR | Status: AC
  Administered 2010-09-30: 150 ug via SUBCUTANEOUS

## 2010-09-30 NOTE — Progress Notes (Signed)
[name] presents today for injection per MD orders. Aranesp 38mcg administered SQ in right Upper Arm. Administration without incident. Patient tolerated well.

## 2010-10-06 ENCOUNTER — Other Ambulatory Visit: Payer: Self-pay | Admitting: Family Medicine

## 2010-10-07 ENCOUNTER — Telehealth: Payer: Self-pay

## 2010-10-07 ENCOUNTER — Telehealth (HOSPITAL_COMMUNITY): Payer: Self-pay

## 2010-10-07 ENCOUNTER — Encounter: Payer: Self-pay | Admitting: Physician Assistant

## 2010-10-07 ENCOUNTER — Encounter (HOSPITAL_COMMUNITY): Payer: Medicare Other

## 2010-10-07 VITALS — BP 137/65 | HR 50

## 2010-10-07 DIAGNOSIS — N039 Chronic nephritic syndrome with unspecified morphologic changes: Secondary | ICD-10-CM

## 2010-10-07 LAB — DIFFERENTIAL
Basophils Absolute: 0
Basophils Relative: 0
Lymphocytes Relative: 13
Lymphs Abs: 1.5
Monocytes Absolute: 1.1 — ABNORMAL HIGH
Monocytes Relative: 10
Neutro Abs: 7.1
Neutro Abs: 8.6 — ABNORMAL HIGH
Neutrophils Relative %: 69

## 2010-10-07 LAB — COMPREHENSIVE METABOLIC PANEL
Alkaline Phosphatase: 61
BUN: 97 — ABNORMAL HIGH
Chloride: 101
GFR calc non Af Amer: 16 — ABNORMAL LOW
Glucose, Bld: 160 — ABNORMAL HIGH
Potassium: 3.5
Total Bilirubin: 0.9
Total Protein: 6.5

## 2010-10-07 LAB — BASIC METABOLIC PANEL
Calcium: 9.1
GFR calc Af Amer: 20 — ABNORMAL LOW
GFR calc non Af Amer: 16 — ABNORMAL LOW
Sodium: 140

## 2010-10-07 LAB — RENAL FUNCTION PANEL
Albumin: 3.3 g/dL — ABNORMAL LOW (ref 3.5–5.2)
Chloride: 105 mEq/L (ref 96–112)
Creatinine, Ser: 3.03 mg/dL — ABNORMAL HIGH (ref 0.50–1.10)
GFR calc Af Amer: 18 mL/min — ABNORMAL LOW (ref >60)
GFR calc non Af Amer: 15 mL/min — ABNORMAL LOW (ref >60)

## 2010-10-07 LAB — URINALYSIS, ROUTINE W REFLEX MICROSCOPIC
Bilirubin Urine: NEGATIVE
Glucose, UA: NEGATIVE
Hgb urine dipstick: NEGATIVE
Nitrite: NEGATIVE
Specific Gravity, Urine: 1.01
pH: 6

## 2010-10-07 LAB — CBC
HCT: 31.8 — ABNORMAL LOW
Hemoglobin: 10.9 — ABNORMAL LOW
Hemoglobin: 11.3 — ABNORMAL LOW
RBC: 4.54
RDW: 15.6 — ABNORMAL HIGH

## 2010-10-07 LAB — URINE MICROSCOPIC-ADD ON

## 2010-10-07 LAB — PROTIME-INR: Prothrombin Time: 14.6

## 2010-10-07 LAB — HEMOGLOBIN A1C
Hgb A1c MFr Bld: 7 — ABNORMAL HIGH
Mean Plasma Glucose: 172

## 2010-10-07 LAB — URINE CULTURE

## 2010-10-07 LAB — HEMOGLOBIN AND HEMATOCRIT, BLOOD: HCT: 34.4 % — ABNORMAL LOW (ref 36.0–46.0)

## 2010-10-07 LAB — CK TOTAL AND CKMB (NOT AT ARMC): Relative Index: 1.3

## 2010-10-07 MED ORDER — DARBEPOETIN ALFA-POLYSORBATE 300 MCG/0.6ML IJ SOLN: 60.0000 ug | Freq: Once | INTRAMUSCULAR | Status: DC

## 2010-10-07 MED ORDER — DARBEPOETIN ALFA-POLYSORBATE 60 MCG/0.3ML IJ SOLN
INTRAMUSCULAR | Status: AC
  Administered 2010-10-07: 60 ug via SUBCUTANEOUS
  Filled 2010-10-07: qty 0.3

## 2010-10-07 NOTE — Telephone Encounter (Signed)
Caregiver, Jacob Moores called on pt's behalf.  States pt's left arm fistula site "looks smaller" and there is a "knot" on the wrist about "size of walnut".   States was told if fistula gets smaller, to call office.  Asked caregiver if she could feel a pulsation through the fistula, and she stated she could.  Advised caregiver will schedule appt. For pt to have left arm fistula evaluated.  Discussed reported sx's w/ Dr. Donnetta Hutching.  He stated pt. does not need a lab study w/ office visit.

## 2010-10-07 NOTE — Telephone Encounter (Signed)
Laboratory called with a critical glucose value of 52.  Patient called at home and informed.  Stated she had just eaten before coming here for her appointment, but would eat a snack now. Will recheck glucose before using insulin.

## 2010-10-07 NOTE — Progress Notes (Signed)
Aleen Campi presents today for injection per MD orders. Aranesp 83mcg administered SQ in right Upper Arm. Administration without incident. Patient tolerated well.

## 2010-10-08 ENCOUNTER — Ambulatory Visit (INDEPENDENT_AMBULATORY_CARE_PROVIDER_SITE_OTHER): Payer: Medicare Other | Admitting: Physician Assistant

## 2010-10-08 VITALS — BP 140/43 | HR 59

## 2010-10-08 DIAGNOSIS — N186 End stage renal disease: Secondary | ICD-10-CM

## 2010-10-08 LAB — BASIC METABOLIC PANEL
BUN: 85 — ABNORMAL HIGH
BUN: 95 — ABNORMAL HIGH
CO2: 28
Chloride: 98
Chloride: 98
Creatinine, Ser: 2.47 — ABNORMAL HIGH
GFR calc non Af Amer: 15 — ABNORMAL LOW
GFR calc non Af Amer: 18 — ABNORMAL LOW
GFR calc non Af Amer: 19 — ABNORMAL LOW
Glucose, Bld: 165 — ABNORMAL HIGH
Glucose, Bld: 212 — ABNORMAL HIGH
Potassium: 3.7
Potassium: 3.8
Sodium: 132 — ABNORMAL LOW
Sodium: 136

## 2010-10-08 LAB — DIFFERENTIAL
Basophils Absolute: 0
Basophils Absolute: 0
Basophils Relative: 0
Eosinophils Absolute: 0
Eosinophils Absolute: 0
Eosinophils Absolute: 0
Eosinophils Relative: 0
Eosinophils Relative: 0
Eosinophils Relative: 0
Lymphocytes Relative: 16
Lymphocytes Relative: 17
Lymphocytes Relative: 20
Lymphs Abs: 1.5
Lymphs Abs: 1.7
Monocytes Absolute: 1
Monocytes Absolute: 1.1 — ABNORMAL HIGH
Monocytes Absolute: 1.3 — ABNORMAL HIGH
Monocytes Relative: 14 — ABNORMAL HIGH
Monocytes Relative: 9
Neutro Abs: 8 — ABNORMAL HIGH
Neutrophils Relative %: 72

## 2010-10-08 LAB — CBC
HCT: 29.9 — ABNORMAL LOW
HCT: 30.2 — ABNORMAL LOW
HCT: 31.1 — ABNORMAL LOW
HCT: 31.6 — ABNORMAL LOW
Hemoglobin: 10.2 — ABNORMAL LOW
Hemoglobin: 10.3 — ABNORMAL LOW
MCHC: 33.8
MCV: 73 — ABNORMAL LOW
MCV: 73.3 — ABNORMAL LOW
MCV: 73.4 — ABNORMAL LOW
Platelets: 178
Platelets: 190
Platelets: 196
RBC: 4.1
RDW: 14.9
RDW: 15.8 — ABNORMAL HIGH
RDW: 15.9 — ABNORMAL HIGH
WBC: 8.2
WBC: 9.5

## 2010-10-08 LAB — RETICULOCYTES
RBC.: 4.31
Retic Count, Absolute: 64.7

## 2010-10-08 LAB — FERRITIN: Ferritin: 74 (ref 10–291)

## 2010-10-08 LAB — COMPREHENSIVE METABOLIC PANEL
Albumin: 2.5 — ABNORMAL LOW
Alkaline Phosphatase: 68
BUN: 91 — ABNORMAL HIGH
Creatinine, Ser: 2.86 — ABNORMAL HIGH
Potassium: 4.2
Total Protein: 6.5

## 2010-10-08 LAB — VITAMIN B12: Vitamin B-12: 623 (ref 211–911)

## 2010-10-08 LAB — IRON AND TIBC
Iron: 22 — ABNORMAL LOW
TIBC: 241 — ABNORMAL LOW

## 2010-10-08 NOTE — Progress Notes (Signed)
Subjective:     Patient ID: [name], female   DOB: 30-Nov-1925, 77 y.o.   MRN: [id number]  HPI 77 y/o female who returns today with complaints of soreness and a "bump" in her left forearm.  She is s/p left brachiocephalic AVF [id number] by Dr. Bridgett Larsson.  She denies any symptoms of steal.  She states that her forearm was sore this am, but it is not sore this afternoon.  It has gone on for about 2 days.  Review of Systems See HPI     Objective:   Physical Exam Filed Vitals:   10/08/10 1453  BP: 140/43  Pulse: 59   2+ left radial pulse Well healed surgical incision Sensory and motor in tact Ecchymosis left forearm as well as right forearm +thrill in brachiocephalic AVF     Assessment:     77 y/o s/p Left brachiocephalic AVF from back in May of this year.  Dr. Oneida Alar also examined the pt and states that she may have a thrombosed vein or a small lipoma.  The pt does not know where her bruises came from and does not remember bumping her arm.    Plan:     Pt reassured and she will call if she has any further problems.  She will f/u on a PRN basis.  Clinic MD: Dr. Oneida Alar

## 2010-10-09 ENCOUNTER — Telehealth: Payer: Self-pay | Admitting: Family Medicine

## 2010-10-12 LAB — CARDIAC PANEL(CRET KIN+CKTOT+MB+TROPI)
CK, MB: 2.8
Relative Index: 0.2
Troponin I: 0.06

## 2010-10-12 LAB — DIFFERENTIAL
Basophils Absolute: 0
Basophils Relative: 0
Eosinophils Absolute: 0
Eosinophils Absolute: 0
Lymphocytes Relative: 15
Lymphs Abs: 1.3
Monocytes Relative: 7
Neutrophils Relative %: 76
Neutrophils Relative %: 79 — ABNORMAL HIGH

## 2010-10-12 LAB — CBC
MCHC: 32.5
MCV: 73.3 — ABNORMAL LOW
Platelets: 183
RBC: 4.52
RDW: 16.4 — ABNORMAL HIGH
WBC: 9

## 2010-10-12 LAB — URINALYSIS, ROUTINE W REFLEX MICROSCOPIC
Glucose, UA: NEGATIVE
pH: 5.5

## 2010-10-12 LAB — GLUCOSE, CAPILLARY

## 2010-10-12 LAB — BASIC METABOLIC PANEL
BUN: 39 — ABNORMAL HIGH
BUN: 41 — ABNORMAL HIGH
CO2: 28
Calcium: 8.6
Chloride: 100
Creatinine, Ser: 2.55 — ABNORMAL HIGH
Creatinine, Ser: 2.7 — ABNORMAL HIGH
GFR calc Af Amer: 21 — ABNORMAL LOW
GFR calc non Af Amer: 18 — ABNORMAL LOW
Glucose, Bld: 251 — ABNORMAL HIGH

## 2010-10-12 LAB — HEMOGLOBIN A1C: Hgb A1c MFr Bld: 6.8 — ABNORMAL HIGH

## 2010-10-12 LAB — URINE MICROSCOPIC-ADD ON

## 2010-10-12 LAB — POCT CARDIAC MARKERS: CKMB, poc: 3.8

## 2010-10-12 NOTE — Telephone Encounter (Signed)
[place] CELL # [id number]

## 2010-10-12 NOTE — Telephone Encounter (Signed)
She can increase her lasix to 40mg  twice a day. She can schedule a visit to be seen this week. If she has chest pain or sob with the leg swelling take her to the ER

## 2010-10-12 NOTE — Telephone Encounter (Signed)
AID STATES WHEN PATIENT SITS UP HER FACE HURTS BUT WHEN SHE LAYS DOWN IT GOES AWAY. STATES IT ACHES LIKE A TOOTHACHE BUT IS NOT A TOOTH. ALSO BILATERAL LEG SWELLING

## 2010-10-12 NOTE — Telephone Encounter (Signed)
Aide to schedule appt for this week

## 2010-10-14 ENCOUNTER — Encounter (HOSPITAL_COMMUNITY): Payer: Medicare Other

## 2010-10-14 ENCOUNTER — Encounter: Payer: Self-pay | Admitting: Family Medicine

## 2010-10-14 ENCOUNTER — Ambulatory Visit (INDEPENDENT_AMBULATORY_CARE_PROVIDER_SITE_OTHER): Payer: Medicare Other | Admitting: Family Medicine

## 2010-10-14 ENCOUNTER — Encounter (HOSPITAL_COMMUNITY): Payer: Medicare Other | Attending: Family Medicine

## 2010-10-14 VITALS — BP 145/68 | HR 70

## 2010-10-14 VITALS — BP 138/64 | HR 56 | Resp 16 | Ht 66.0 in | Wt 208.8 lb

## 2010-10-14 DIAGNOSIS — D649 Anemia, unspecified: Secondary | ICD-10-CM | POA: Insufficient documentation

## 2010-10-14 DIAGNOSIS — G4700 Insomnia, unspecified: Secondary | ICD-10-CM

## 2010-10-14 DIAGNOSIS — D631 Anemia in chronic kidney disease: Secondary | ICD-10-CM | POA: Insufficient documentation

## 2010-10-14 DIAGNOSIS — R609 Edema, unspecified: Secondary | ICD-10-CM

## 2010-10-14 DIAGNOSIS — N189 Chronic kidney disease, unspecified: Secondary | ICD-10-CM | POA: Insufficient documentation

## 2010-10-14 DIAGNOSIS — N039 Chronic nephritic syndrome with unspecified morphologic changes: Secondary | ICD-10-CM | POA: Insufficient documentation

## 2010-10-14 DIAGNOSIS — W19XXXA Unspecified fall, initial encounter: Secondary | ICD-10-CM

## 2010-10-14 DIAGNOSIS — S40029A Contusion of unspecified upper arm, initial encounter: Secondary | ICD-10-CM

## 2010-10-14 DIAGNOSIS — R51 Headache: Secondary | ICD-10-CM

## 2010-10-14 MED ORDER — DARBEPOETIN ALFA-POLYSORBATE 60 MCG/0.3ML IJ SOLN
60.0000 ug | Freq: Once | INTRAMUSCULAR | Status: AC
  Administered 2010-10-14: 60 ug via SUBCUTANEOUS

## 2010-10-14 MED ORDER — DARBEPOETIN ALFA-POLYSORBATE 60 MCG/0.3ML IJ SOLN
INTRAMUSCULAR | Status: AC
  Filled 2010-10-14: qty 0.3

## 2010-10-14 NOTE — Assessment & Plan Note (Signed)
Now resolved, as she has no focal deficits will not scan at this time, pt also did not hit head this AM

## 2010-10-14 NOTE — Assessment & Plan Note (Signed)
This is a long standing problem, as she is at home alone, I dont think adding any meds that may cause drowsiness is a good idea for her. She is not keen on taking sleeping Aids. I advised her to try not to nap so late into the evening

## 2010-10-14 NOTE — Progress Notes (Signed)
  Subjective:    Patient ID: [name], female    DOB: [birth year], 77 y.o.   MRN: [id number]  HPI pt here with mulitple concerns  Insomnia- has not slept well in years, has never been on sleep aids, naps a lot during the morning and throughout the day, goes to bed at 8:30pm, typically wakes up a few hours later. No caffiene before bedtime  Face pain- pt was having face pain and HA on right side of face last week, now resolved, she thinks she was grinding her teeth, after she stopped this her HA and pain went away. No change in vision, no fever, no change in congnition noted  Fall- this AM fell to knees when getting up off her bedside commode. Did not feel sick these past few days, no dizziness prior to . Has history of recurrent falls, did not hit her head or any other body part, she called her son instead of using her life alert bracelet. Of note her Aide spoke with me prior to the visit to repeat the fall as she was not sure the pt would do so, because she does not want to go into a home.  Bruising- has bruises on arms, not sure if any injury  Knot on left arm- the same lesion became larger a few weeks ago, seen by [facility], by that appt it was down to regular size, still slightly tender, they evaluated it and felt it was benign. Note reviewed  Leg swelling- legs improved some today, she never increased the lasix as her aide was afraid to do so   Review of Systems       GEN- no fever, no chills            HEENT- no change in vision        CVS- no CP, no palpitations, +edema        RESP- occ SOB, no cough       Neuro- no dizziness, occ feels lightheaded    Objective:   Physical Exam GEN- NAD, pleasant, alert and oriented, hard of hearing, in wheelchair today, weight up 4 lbs   HEENT- PERRL, EOMI, non injected, pupils small unable to perform fundoscocpic exam, oro pharynx clear, no dental absccess, no abrasions on head  CVS- RRR, 3/6 SEM  RESP- mild basilar crackles, R base > L, , no  rhonchi, normal WOB at rest, EXT- 2+ edema Skin- LUE fistula +thrill, +bruit,  Left forearm, 2 small areas of bruising- on both upper ext,small cystic like lesion felt beneath bruise-< 0.5cm, mobile- feels smaller than previously Neuro- mentation in tact, CN II-XII grossly in tact, no new focal deficits        Assessment & Plan:

## 2010-10-14 NOTE — Assessment & Plan Note (Signed)
Continue to watch lesion on arm, advised if it swells should have it looked at during that time

## 2010-10-14 NOTE — Assessment & Plan Note (Signed)
Add a dose of lasix at bedtime for the new 2 nights. Overall she appears compensated, no change in oxygen requirment.

## 2010-10-14 NOTE — Assessment & Plan Note (Signed)
History of recurrent falls, will have PT evaluate in the Home, she did have PT after a hospitalization Note has Aide during the day, calls son when she needs help at night, has life alert but does not want to use this

## 2010-10-14 NOTE — Patient Instructions (Addendum)
For the next 2 days, give lasix 20mg  at bedtime, continue the 40mg  tablet in the morning - for the fluid For your sleep - try to nap after 4pm, this will help at night I do not think it is a good idea for you to have sleeping medication  I will send a referral for physical therapy  Change follow-up appt to 2 weeks

## 2010-10-21 ENCOUNTER — Encounter (HOSPITAL_COMMUNITY): Payer: Medicare Other

## 2010-10-21 VITALS — BP 161/71 | HR 62

## 2010-10-21 DIAGNOSIS — N039 Chronic nephritic syndrome with unspecified morphologic changes: Secondary | ICD-10-CM

## 2010-10-21 MED ORDER — DARBEPOETIN ALFA-POLYSORBATE 100 MCG/0.5ML IJ SOLN
100.0000 ug | INTRAMUSCULAR | Status: AC
  Administered 2010-10-21: 60 ug via SUBCUTANEOUS

## 2010-10-21 MED ORDER — DARBEPOETIN ALFA-POLYSORBATE 60 MCG/0.3ML IJ SOLN
INTRAMUSCULAR | Status: AC
  Administered 2010-10-21: 60 ug via SUBCUTANEOUS
  Filled 2010-10-21: qty 0.3

## 2010-10-21 NOTE — Progress Notes (Signed)
VSS. Aranesp 60 mcgs subcutaneously to lower left abd.

## 2010-10-22 ENCOUNTER — Other Ambulatory Visit: Payer: Self-pay | Admitting: Family Medicine

## 2010-10-23 DIAGNOSIS — I509 Heart failure, unspecified: Secondary | ICD-10-CM

## 2010-10-23 DIAGNOSIS — I5031 Acute diastolic (congestive) heart failure: Secondary | ICD-10-CM

## 2010-10-23 DIAGNOSIS — D649 Anemia, unspecified: Secondary | ICD-10-CM

## 2010-10-23 DIAGNOSIS — I129 Hypertensive chronic kidney disease with stage 1 through stage 4 chronic kidney disease, or unspecified chronic kidney disease: Secondary | ICD-10-CM

## 2010-10-26 ENCOUNTER — Other Ambulatory Visit (HOSPITAL_COMMUNITY): Payer: Self-pay | Admitting: Oncology

## 2010-10-26 DIAGNOSIS — D631 Anemia in chronic kidney disease: Secondary | ICD-10-CM

## 2010-10-28 ENCOUNTER — Ambulatory Visit (HOSPITAL_COMMUNITY): Payer: Medicare Other

## 2010-10-28 ENCOUNTER — Encounter (HOSPITAL_COMMUNITY): Payer: Medicare Other

## 2010-10-28 ENCOUNTER — Encounter: Payer: Self-pay | Admitting: Family Medicine

## 2010-10-28 ENCOUNTER — Ambulatory Visit (INDEPENDENT_AMBULATORY_CARE_PROVIDER_SITE_OTHER): Payer: Medicare Other | Admitting: Family Medicine

## 2010-10-28 VITALS — BP 132/70 | HR 59 | Resp 16 | Wt 209.0 lb

## 2010-10-28 VITALS — BP 156/64 | HR 55

## 2010-10-28 DIAGNOSIS — D631 Anemia in chronic kidney disease: Secondary | ICD-10-CM

## 2010-10-28 DIAGNOSIS — S40029A Contusion of unspecified upper arm, initial encounter: Secondary | ICD-10-CM

## 2010-10-28 DIAGNOSIS — E118 Type 2 diabetes mellitus with unspecified complications: Secondary | ICD-10-CM

## 2010-10-28 DIAGNOSIS — I10 Essential (primary) hypertension: Secondary | ICD-10-CM

## 2010-10-28 DIAGNOSIS — R609 Edema, unspecified: Secondary | ICD-10-CM

## 2010-10-28 LAB — RENAL FUNCTION PANEL
BUN: 47 mg/dL — ABNORMAL HIGH (ref 6–23)
Chloride: 105 mEq/L (ref 96–112)
Glucose, Bld: 108 mg/dL — ABNORMAL HIGH (ref 70–99)
Potassium: 4.9 mEq/L (ref 3.5–5.1)

## 2010-10-28 LAB — CBC
HCT: 36.4 % (ref 36.0–46.0)
MCV: 78.4 fL (ref 78.0–100.0)
RBC: 4.64 MIL/uL (ref 3.87–5.11)
WBC: 5 10*3/uL (ref 4.0–10.5)

## 2010-10-28 MED ORDER — DARBEPOETIN ALFA-POLYSORBATE 60 MCG/0.3ML IJ SOLN
INTRAMUSCULAR | Status: AC
  Administered 2010-10-28: 60 ug via SUBCUTANEOUS
  Filled 2010-10-28: qty 0.3

## 2010-10-28 MED ORDER — FUROSEMIDE 20 MG PO TABS: ORAL_TABLET | ORAL | Status: DC

## 2010-10-28 MED ORDER — POTASSIUM CHLORIDE ER 10 MEQ PO TBCR: 10.0000 meq | EXTENDED_RELEASE_TABLET | Freq: Every day | ORAL | Status: DC

## 2010-10-28 MED ORDER — DARBEPOETIN ALFA-POLYSORBATE 100 MCG/0.5ML IJ SOLN
60.0000 ug | Freq: Once | INTRAMUSCULAR | Status: AC
  Administered 2010-10-28: 60 ug via SUBCUTANEOUS

## 2010-10-28 NOTE — Patient Instructions (Addendum)
For your diabetes- stop the insulin  Call if your sugars are greater than 150 in the morning  For the swelling take the 40mg  daily, take 20 mg at bedtime Take the potassium pill with the bedtime dose of lasix  I will follow-up your labs  Continue your other medications I encourage you to eat Try the glucerna 30 minutes before dinner  F/U in 6 weeks with Dr. Moshe Cipro for recheck on diabetes- bring your meter and blood pressure

## 2010-10-28 NOTE — Progress Notes (Signed)
[name] presents today for injection per MD orders. Aranesp 60 mcg administered SQ in right Upper Arm. Administration without incident. Patient tolerated well.

## 2010-10-28 NOTE — Progress Notes (Signed)
  Subjective:    Patient ID: [name], female    DOB: 1925/10/21, 77 y.o.   MRN: [id number]  HPI DM- CBG running 70's in AM appetite not good, no elevated values in evening, Taking 70/30 as directed, feels fatigued a lot, does not want to eat, drinks glucerna  Leg edema- legs went down on previous regimine with addition of 20mg  of lasix in the evening, now swelling has increased,weight unchanged from previous visit.  Bruise- has a new bruise on right arm, she does not remember hitting anything, no pain,no other bleeding  Review of Systems             GEN- no fever, fatigue, no chills        CVS- no CP, no palpitations, +edema        RESP- occ SOB, no cough, wearing oxygen       Neuro- no dizziness, occ feels lightheaded, no recent falls        GU- no dysuria, no abd pain, no N/V, no abd pressure , +strong odor to urine per Aide, incontinent    Objective:   Physical Exam  GEN- NAD, pleasant, alert and oriented, hard of hearing, in wheelchair today, weight up 4 lbs  CVS- RRR, 3/6 SEM  RESP- CTAB , no rhonchi, normal WOB at rest, EXT- 2+ edema Skin- LUE fistula +thrill, +bruit, Righ  forearm,  small areas of bruising- small cystic like lesion felt beneath bruise-< 0.5cm, mobile-      Assessment & Plan:     Aide attemped to get urine sample today, before I examined pt, she is asymptomatic and incontinent, I explained unless she has symptoms I would not treat the urine, therefore no repeat sample was attempted.

## 2010-10-29 NOTE — Assessment & Plan Note (Signed)
Previous bruise resolved, pt has a new one that is resolving, these appear to be in the exact areas where she hits her wheelchair or portable potty at home. No active bleeding

## 2010-10-29 NOTE — Assessment & Plan Note (Signed)
The last A1C was 5.7%, her renal function has worsened. Her appetite has been up and down. Currently on very low dose of insulin. With her hypoglycemia I would prefer to  To stop the insulin and monitor her.

## 2010-10-29 NOTE — Assessment & Plan Note (Signed)
Will increase lasix back to 40mg  in AM, 20mg  in pm, add potassium low dose, secondary to renal function

## 2010-10-29 NOTE — Assessment & Plan Note (Signed)
Continue current meds, at goal

## 2010-11-02 ENCOUNTER — Other Ambulatory Visit: Payer: Self-pay | Admitting: Family Medicine

## 2010-11-04 ENCOUNTER — Ambulatory Visit (HOSPITAL_COMMUNITY): Payer: Medicare Other

## 2010-11-04 ENCOUNTER — Other Ambulatory Visit: Payer: Self-pay | Admitting: Family Medicine

## 2010-11-09 ENCOUNTER — Other Ambulatory Visit: Payer: Self-pay | Admitting: Family Medicine

## 2010-11-09 NOTE — Telephone Encounter (Signed)
Left a message, received paperwork for a motorized scooter, I am not sure if pt applied for this

## 2010-11-11 ENCOUNTER — Encounter (HOSPITAL_COMMUNITY): Payer: Medicare Other

## 2010-11-11 ENCOUNTER — Telehealth: Payer: Self-pay | Admitting: Family Medicine

## 2010-11-11 ENCOUNTER — Ambulatory Visit: Payer: Medicare Other | Admitting: Family Medicine

## 2010-11-11 ENCOUNTER — Ambulatory Visit (HOSPITAL_COMMUNITY): Payer: Medicare Other

## 2010-11-11 VITALS — BP 139/59 | HR 53

## 2010-11-11 DIAGNOSIS — R2681 Unsteadiness on feet: Secondary | ICD-10-CM

## 2010-11-11 DIAGNOSIS — D649 Anemia, unspecified: Secondary | ICD-10-CM

## 2010-11-11 LAB — RENAL FUNCTION PANEL
Albumin: 3.2 g/dL — ABNORMAL LOW (ref 3.5–5.2)
Chloride: 103 mEq/L (ref 96–112)
Creatinine, Ser: 2.88 mg/dL — ABNORMAL HIGH (ref 0.50–1.10)
GFR calc non Af Amer: 14 mL/min — ABNORMAL LOW (ref >90)
Potassium: 5.2 mEq/L — ABNORMAL HIGH (ref 3.5–5.1)

## 2010-11-11 LAB — HEMOGLOBIN AND HEMATOCRIT, BLOOD
HCT: 35.8 % — ABNORMAL LOW (ref 36.0–46.0)
Hemoglobin: 11.4 g/dL — ABNORMAL LOW (ref 12.0–15.0)

## 2010-11-11 MED ORDER — DARBEPOETIN ALFA-POLYSORBATE 60 MCG/0.3ML IJ SOLN
INTRAMUSCULAR | Status: AC
  Filled 2010-11-11: qty 0.3

## 2010-11-11 MED ORDER — DARBEPOETIN ALFA-POLYSORBATE 60 MCG/0.3ML IJ SOLN
60.0000 ug | Freq: Once | INTRAMUSCULAR | Status: AC
  Administered 2010-11-11: 60 ug via SUBCUTANEOUS

## 2010-11-11 NOTE — Telephone Encounter (Signed)
I spoke with pt , she did request scooter. She has a PT referral pending, will have them do complete evaluation for scooter

## 2010-11-11 NOTE — Progress Notes (Signed)
1220  Aranesp 60 mcg given subcutaneouslyto right upper arm.  Specimen obtained peripherally from right [facility] for h&h and renal profile.

## 2010-11-12 ENCOUNTER — Ambulatory Visit: Payer: Medicare Other | Admitting: Family Medicine

## 2010-11-18 ENCOUNTER — Ambulatory Visit (HOSPITAL_COMMUNITY): Payer: Medicare Other

## 2010-11-23 ENCOUNTER — Ambulatory Visit (HOSPITAL_COMMUNITY): Payer: Medicare Other | Admitting: Physical Therapy

## 2010-11-25 ENCOUNTER — Encounter (HOSPITAL_COMMUNITY): Payer: Medicare HMO | Attending: Family Medicine

## 2010-11-25 ENCOUNTER — Ambulatory Visit (HOSPITAL_COMMUNITY): Payer: Medicare Other

## 2010-11-25 VITALS — BP 102/55 | HR 54 | Resp 18

## 2010-11-25 DIAGNOSIS — N189 Chronic kidney disease, unspecified: Secondary | ICD-10-CM | POA: Insufficient documentation

## 2010-11-25 DIAGNOSIS — D631 Anemia in chronic kidney disease: Secondary | ICD-10-CM | POA: Insufficient documentation

## 2010-11-25 MED ORDER — DARBEPOETIN ALFA-POLYSORBATE 60 MCG/0.3ML IJ SOLN
60.0000 ug | Freq: Once | INTRAMUSCULAR | Status: AC
  Administered 2010-11-25: 60 ug via SUBCUTANEOUS

## 2010-11-25 NOTE — Progress Notes (Signed)
[name] presents today for injection per MD orders. Aranesp 60 mcg administered SQ in right Upper Arm. Administration without incident. Patient tolerated well.

## 2010-11-30 ENCOUNTER — Encounter: Payer: Self-pay | Admitting: Family Medicine

## 2010-12-01 ENCOUNTER — Other Ambulatory Visit: Payer: Self-pay | Admitting: Family Medicine

## 2010-12-04 ENCOUNTER — Other Ambulatory Visit: Payer: Self-pay | Admitting: Family Medicine

## 2010-12-09 ENCOUNTER — Ambulatory Visit (INDEPENDENT_AMBULATORY_CARE_PROVIDER_SITE_OTHER): Payer: Medicare Other | Admitting: Family Medicine

## 2010-12-09 ENCOUNTER — Encounter: Payer: Self-pay | Admitting: Family Medicine

## 2010-12-09 ENCOUNTER — Encounter (HOSPITAL_COMMUNITY): Payer: Medicare HMO

## 2010-12-09 VITALS — BP 102/48 | HR 50

## 2010-12-09 VITALS — BP 122/60 | HR 56 | Resp 16 | Ht 65.0 in | Wt 198.0 lb

## 2010-12-09 DIAGNOSIS — R5381 Other malaise: Secondary | ICD-10-CM

## 2010-12-09 DIAGNOSIS — I10 Essential (primary) hypertension: Secondary | ICD-10-CM

## 2010-12-09 DIAGNOSIS — R5383 Other fatigue: Secondary | ICD-10-CM

## 2010-12-09 DIAGNOSIS — M129 Arthropathy, unspecified: Secondary | ICD-10-CM

## 2010-12-09 DIAGNOSIS — E785 Hyperlipidemia, unspecified: Secondary | ICD-10-CM

## 2010-12-09 DIAGNOSIS — K5909 Other constipation: Secondary | ICD-10-CM | POA: Insufficient documentation

## 2010-12-09 DIAGNOSIS — E118 Type 2 diabetes mellitus with unspecified complications: Secondary | ICD-10-CM

## 2010-12-09 DIAGNOSIS — K5900 Constipation, unspecified: Secondary | ICD-10-CM

## 2010-12-09 DIAGNOSIS — Z23 Encounter for immunization: Secondary | ICD-10-CM

## 2010-12-09 LAB — HEMOGLOBIN AND HEMATOCRIT, BLOOD: HCT: 35.6 % — ABNORMAL LOW (ref 36.0–46.0)

## 2010-12-09 LAB — RENAL FUNCTION PANEL
CO2: 26 mEq/L (ref 19–32)
Chloride: 103 mEq/L (ref 96–112)
Creatinine, Ser: 3.31 mg/dL — ABNORMAL HIGH (ref 0.50–1.10)
GFR calc Af Amer: 14 mL/min — ABNORMAL LOW (ref >90)
GFR calc non Af Amer: 12 mL/min — ABNORMAL LOW (ref >90)
Glucose, Bld: 178 mg/dL — ABNORMAL HIGH (ref 70–99)

## 2010-12-09 MED ORDER — DARBEPOETIN ALFA-POLYSORBATE 100 MCG/0.5ML IJ SOLN
60.0000 ug | Freq: Once | INTRAMUSCULAR | Status: AC
  Administered 2010-12-09: 60 ug via SUBCUTANEOUS

## 2010-12-09 NOTE — Progress Notes (Signed)
[name] presents today for injection per MD orders. Aranesp 60 mcg administered SQ in right Upper Arm. Administration without incident. Patient tolerated well.

## 2010-12-09 NOTE — Assessment & Plan Note (Signed)
Controlled, no change in medication  

## 2010-12-09 NOTE — Assessment & Plan Note (Signed)
Fasting labs due and are to be done in Dec, continue current med

## 2010-12-09 NOTE — Assessment & Plan Note (Signed)
Currently on no insulin reportedly, hBA1C in Dec

## 2010-12-09 NOTE — Patient Instructions (Signed)
F/u with Dr Buelah Manis in 3.5 months.  Fasting lipid, hepatic HBA1C and TSH in December  No med changes at this time.  Flu vaccine today.  Ok to use OTC stool softener colace one twice daily , also start putting all Bran in your cheerios to increase the fiber and help the bowels To move more regulalrly

## 2010-12-09 NOTE — Progress Notes (Signed)
  Subjective:    Patient ID: [name], female    DOB: November 06, 1925, 77 y.o.   MRN: [id number]  HPI The PT is here for follow up and re-evaluation of chronic medical conditions, medication management and review of any available recent lab and radiology data.   The PT denies any adverse reactions to current medications since the last visit.  There are no new concerns. Wants her flu vaccine There are no specific complaints       Review of Systems    Denies recent fever or chills. Denies sinus pressure, nasal congestion, ear pain or sore throat. Denies chest congestion, productive cough or wheezing. Denies chest pains or  palpitations does report  leg swelling, denies pND or orthopnea C/o  constipation.   Denies dysuria, frequency, hesitancy or incontinence. Chronic back pain with limitation in mobility Denies headaches, seizures, numbness, or tingling. Denies uncontrolled  depression, anxiety or insomnia. Denies skin break down or rash.     Objective:   Physical Exam Patient alert and oriented and in no cardiopulmonary distress.Wheelchair dependent  HEENT: No facial asymmetry, EOMI, no sinus tenderness,  oropharynx pink and moist.  Neck decreased ROM, no adenopathy.  Chest: Clear to auscultation bilaterally.  CVS: S1, S2 no murmurs, no S3.  ABD: Soft non tender.  Ext: one plus  edema  MS: decreased  ROM spine, shoulders, hips and knees.  Skin: Intact, no ulcerations or rash noted.  Psych: Good eye contact, normal affect. Not anxious or depressed appearing.  CNS: CN 2-12 intact.        Assessment & Plan:

## 2010-12-13 ENCOUNTER — Encounter: Payer: Self-pay | Admitting: Family Medicine

## 2010-12-13 NOTE — Assessment & Plan Note (Signed)
C/o hard infrequent stool on avg 2 to 3 times per week, however no change in caliber. Pt is wheelchair bound and therefore immobile . Encouraged and discussed increased daily fiber and stool softeners

## 2010-12-13 NOTE — Assessment & Plan Note (Signed)
Reduced mobility, wheelchair dependent

## 2010-12-14 ENCOUNTER — Ambulatory Visit (HOSPITAL_COMMUNITY): Payer: Medicare Other | Admitting: Specialist

## 2010-12-17 ENCOUNTER — Telehealth: Payer: Self-pay

## 2010-12-17 NOTE — Telephone Encounter (Signed)
pls let them know verbally this is ok

## 2010-12-18 ENCOUNTER — Ambulatory Visit (HOSPITAL_COMMUNITY): Payer: Medicare Other | Admitting: Specialist

## 2010-12-23 ENCOUNTER — Telehealth: Payer: Self-pay

## 2010-12-23 ENCOUNTER — Encounter (HOSPITAL_COMMUNITY): Payer: Medicare HMO | Attending: Family Medicine

## 2010-12-23 VITALS — BP 119/56 | HR 56

## 2010-12-23 DIAGNOSIS — D631 Anemia in chronic kidney disease: Secondary | ICD-10-CM | POA: Insufficient documentation

## 2010-12-23 DIAGNOSIS — N189 Chronic kidney disease, unspecified: Secondary | ICD-10-CM | POA: Insufficient documentation

## 2010-12-23 DIAGNOSIS — N039 Chronic nephritic syndrome with unspecified morphologic changes: Secondary | ICD-10-CM | POA: Insufficient documentation

## 2010-12-23 LAB — HEPATIC FUNCTION PANEL
ALT: 8 U/L (ref 0–35)
AST: 14 U/L (ref 0–37)
Bilirubin, Direct: 0.2 mg/dL (ref 0.0–0.3)
Indirect Bilirubin: 0.6 mg/dL (ref 0.0–0.9)

## 2010-12-23 LAB — LIPID PANEL
Cholesterol: 130 mg/dL (ref 0–200)
Triglycerides: 66 mg/dL (ref <150)

## 2010-12-23 MED ORDER — DARBEPOETIN ALFA-POLYSORBATE 100 MCG/0.5ML IJ SOLN
60.0000 ug | Freq: Once | INTRAMUSCULAR | Status: AC
  Administered 2010-12-23: 60 ug via SUBCUTANEOUS

## 2010-12-23 NOTE — Telephone Encounter (Signed)
Correction on this note written , this is the first call on the pt, if she is having difficulty breathing or cough she needs to go to the ed for further eval, l recently saw her in the office , lungs were clear, no visits are available pls let them know

## 2010-12-23 NOTE — Telephone Encounter (Signed)
Pt needs to be evaluated  Ed since there are no available visits and there are multiple calls concerned about UTI, no specimen sent although it was ordered, behavior, now breath sounds

## 2010-12-23 NOTE — Telephone Encounter (Signed)
Spoke with pt and she stated that if she developed a cough or shortness of breath to go to the ed for eval.

## 2010-12-25 ENCOUNTER — Other Ambulatory Visit: Payer: Self-pay

## 2010-12-25 MED ORDER — BENZONATATE 100 MG PO CAPS: 100.0000 mg | ORAL_CAPSULE | Freq: Three times a day (TID) | ORAL | Status: DC | PRN

## 2010-12-25 NOTE — Telephone Encounter (Signed)
Needs urgent care or ED evaluation, pls let the nurse know

## 2010-12-25 NOTE — Telephone Encounter (Signed)
pls advise and erx tessalon perles 100mg  one 3 times daily for 10 days #30 only, if not covered , if they calll back, robitussin otc is good

## 2010-12-25 NOTE — Telephone Encounter (Signed)
Spoke with pharmacy. Tessalon Perles not covered under pt insurance.  Robitussin AC q6 prn cough no refills ordered in place.

## 2010-12-25 NOTE — Telephone Encounter (Signed)
Pt states that she does not have a fever.

## 2010-12-25 NOTE — Telephone Encounter (Signed)
Spoke with pt and she stated that she does not want to go to urgent care or the ed because of transportation issues.  She stated that she just has "bad cold" and needs something prescribed because they can deliver it to her home.

## 2010-12-27 ENCOUNTER — Other Ambulatory Visit: Payer: Self-pay

## 2010-12-27 ENCOUNTER — Encounter (HOSPITAL_COMMUNITY): Payer: Self-pay | Admitting: Emergency Medicine

## 2010-12-27 ENCOUNTER — Emergency Department (HOSPITAL_COMMUNITY)
Admission: EM | Admit: 2010-12-27 | Discharge: 2010-12-27 | Disposition: A | Discharge status: Home / Self Care | Payer: Medicare HMO | Attending: Emergency Medicine | Admitting: Emergency Medicine

## 2010-12-27 ENCOUNTER — Emergency Department (HOSPITAL_COMMUNITY): Payer: Medicare HMO

## 2010-12-27 DIAGNOSIS — Z79899 Other long term (current) drug therapy: Secondary | ICD-10-CM | POA: Insufficient documentation

## 2010-12-27 DIAGNOSIS — R05 Cough: Secondary | ICD-10-CM | POA: Insufficient documentation

## 2010-12-27 DIAGNOSIS — I120 Hypertensive chronic kidney disease with stage 5 chronic kidney disease or end stage renal disease: Secondary | ICD-10-CM | POA: Insufficient documentation

## 2010-12-27 DIAGNOSIS — Z8679 Personal history of other diseases of the circulatory system: Secondary | ICD-10-CM | POA: Insufficient documentation

## 2010-12-27 DIAGNOSIS — I509 Heart failure, unspecified: Secondary | ICD-10-CM | POA: Insufficient documentation

## 2010-12-27 DIAGNOSIS — R059 Cough, unspecified: Secondary | ICD-10-CM | POA: Insufficient documentation

## 2010-12-27 DIAGNOSIS — R0602 Shortness of breath: Secondary | ICD-10-CM | POA: Insufficient documentation

## 2010-12-27 DIAGNOSIS — J189 Pneumonia, unspecified organism: Secondary | ICD-10-CM

## 2010-12-27 DIAGNOSIS — J9801 Acute bronchospasm: Secondary | ICD-10-CM | POA: Insufficient documentation

## 2010-12-27 DIAGNOSIS — N185 Chronic kidney disease, stage 5: Secondary | ICD-10-CM | POA: Insufficient documentation

## 2010-12-27 DIAGNOSIS — E119 Type 2 diabetes mellitus without complications: Secondary | ICD-10-CM | POA: Insufficient documentation

## 2010-12-27 DIAGNOSIS — K589 Irritable bowel syndrome without diarrhea: Secondary | ICD-10-CM | POA: Insufficient documentation

## 2010-12-27 MED ORDER — AZITHROMYCIN 250 MG PO TABS: ORAL_TABLET | ORAL | Status: AC

## 2010-12-27 MED ORDER — ALBUTEROL SULFATE HFA 108 (90 BASE) MCG/ACT IN AERS: 2.0000 | INHALATION_SPRAY | RESPIRATORY_TRACT | Status: DC | PRN

## 2010-12-27 MED ORDER — AMOXICILLIN 500 MG PO TABS: 1000.0000 mg | ORAL_TABLET | Freq: Two times a day (BID) | ORAL | Status: DC

## 2010-12-27 MED ORDER — ALBUTEROL SULFATE HFA 108 (90 BASE) MCG/ACT IN AERS
2.0000 | INHALATION_SPRAY | Freq: Once | RESPIRATORY_TRACT | Status: AC
  Administered 2010-12-27: 2 via RESPIRATORY_TRACT
  Filled 2010-12-27: qty 6.7

## 2010-12-27 MED ORDER — PREDNISONE 20 MG PO TABS: ORAL_TABLET | ORAL | Status: DC

## 2010-12-27 NOTE — ED Notes (Signed)
Patient arrives via EMS from home with c/o shortness of breath and cough since yesterday worsening this morning. Diffuse wheezing noted.   1 duo neb, 1 albuterol neb and 125 mg solu medrol given by EMS.

## 2010-12-27 NOTE — ED Provider Notes (Signed)
History   This chart was scribed for Babette Relic, MD by Carolyne Littles. The patient was seen in room APA08/APA08 and the patient's care was started at 9:32AM.   CSN: [id number] Arrival date & time: 12/27/2010  9:09 AM   First MD Initiated Contact with Patient 12/27/10 [id number]      Chief Complaint  Patient presents with  . Shortness of Breath  . Cough    (Consider location/radiation/quality/duration/timing/severity/associated sxs/prior treatment) HPI [name] is a 77 y.o. female who presents to the Emergency Department complaining of constant moderate to severe productive cough with brown-yellow sputum and SOB onset several days ago and persistent since with associated chest and abdominal soreness secondary to cough. Denies fever, chills, confusion, n/v/d, hematochezia, weight gain, edema. Patient reports symptoms mild to moderately relieved with administration of albuterol nebulizer by EMS en route. Patient with h/o DM, HTN, HLD, CHF and chronic kidney disease but denies h/o COPD, emphysema and asthma   Past Medical History  Diagnosis Date  . Anxiety   . Diabetes mellitus, type 2   . Hypertension   . Stroke     residual right side weakness and pain  . IBS (irritable bowel syndrome)     with costipation  . Overactive bladder   . Cataracts, both eyes   . Gynecologic cancer   . Hyperlipidemia   . CKD (chronic kidney disease), stage V   . Anemia in CKD (chronic kidney disease)   . SVT (supraventricular tachycardia)   . CHF (congestive heart failure)   . Arthritis     gout  . SOB (shortness of breath)     nml spirometry 09/05/06    Past Surgical History  Procedure Date  . Vesicovaginal fistula closure w/ tah   . Av fistula placement, brachiocephalic [id number]    left arm   by Dr. Bridgett Larsson  . Tee without cardioversion 2011    2/2 Strep Bovis infection    No family history on file.  History  Substance Use Topics  . Smoking status: Never Smoker   . Smokeless  tobacco: Not on file  . Alcohol Use: No    OB History    Grav Para Term Preterm Abortions TAB SAB Ect Mult Living                  Review of Systems  Constitutional: Negative for fever and chills.       10 Systems reviewed and are negative for acute change except as noted in the HPI.  HENT: Negative for congestion.   Eyes: Negative for discharge and redness.  Respiratory: Positive for cough and shortness of breath.        Chest soreness secondary to cough.   Cardiovascular: Negative for chest pain and leg swelling.  Gastrointestinal: Negative for nausea, vomiting, abdominal pain and diarrhea.  Musculoskeletal: Negative for back pain.  Skin: Negative for rash.  Neurological: Negative for syncope, numbness and headaches.  Psychiatric/Behavioral: Negative for confusion.       No behavior change.    Allergies  Review of patient's allergies indicates no known allergies.  Home Medications   Current Outpatient Rx  Name Route Sig Dispense Refill  . ALBUTEROL SULFATE HFA 108 (90 BASE) MCG/ACT IN AERS Inhalation Inhale 2 puffs into the lungs every 2 (two) hours as needed for wheezing or shortness of breath (cough). 1 Inhaler 0  . AMLODIPINE BESYLATE PO Oral Take 10 mg by mouth daily.      . AMOXICILLIN 500  MG PO TABS Oral Take 2 tablets (1,000 mg total) by mouth 2 (two) times daily. 28 tablet 0  . ASPIRIN 81 MG PO CHEW Oral Chew 81 mg by mouth daily.      . AZITHROMYCIN 250 MG PO TABS  2 po day one, then 1 daily x 4 days 5 tablet 0  . BENZONATATE 100 MG PO CAPS Oral Take 1 capsule (100 mg total) by mouth 3 (three) times daily as needed for cough. 30 capsule 0  . CARBOXYMETHYLCELLUL-GLYCERIN 0.5-0.9 % OP SOLN Ophthalmic Apply to eye. One drop in both eyes two times a day       . CARVEDILOL 12.5 MG PO TABS Oral Take 12.5 mg by mouth 2 (two) times daily with a meal.      . CITALOPRAM HYDROBROMIDE 20 MG PO TABS  TAKE (1) TABLET BY MOUTH ONCE DAILY. 30 tablet 3  . GLUCERNA SHAKE PO LIQD  Oral Take 237 mLs by mouth daily.      Marland Kitchen FLUTICASONE PROPIONATE 50 MCG/ACT NA SUSP Nasal 2 sprays by Nasal route daily.      . FUROSEMIDE 20 MG PO TABS  Take 20mg  in the evening. 30 tablet 6  . FUROSEMIDE 40 MG PO TABS  TAKE 1 TABLET ONCE DAILY. 30 tablet 2  . GABAPENTIN 600 MG PO TABS  TAKE ONE TABLET BY MOUTH AT BEDTIME. 30 tablet 6  . GLUCOSE BLOOD VI STRP Other 1 each by Other route as needed. Use as instructed     . HYDRALAZINE HCL 50 MG PO TABS  TAKE 1 TABLET EVERY 8 HOURS 90 tablet 3  . INSULIN ASPART PROT & ASPART (70-30) 100 UNIT/ML [place] SUSP Subcutaneous Inject into the skin. 8 units every morning ,5units every evening    . INSULIN PEN NEEDLE 29G X 12MM MISC Subcutaneous Inject into the skin. Use with insuline pen two times a day     . ISOSORBIDE DINITRATE ER 40 MG PO TBCR  TAKE 1 TABLET BY MOUTH 3 TIMES DAILY. 90 tablet 3  . ISOSORBIDE DINITRATE 40 MG PO TABS Oral Take 40 mg by mouth 3 (three) times daily.      Marland Kitchen NEXIUM 40 MG PO CPDR  TAKE 1 CAPSULE BY MOUTH ONCE A DAY. 30 each 6  . NORVASC 10 MG PO TABS  TAKE (1) TABLET BY MOUTH ONCE DAILY. 30 each 3  . ONETOUCH LANCETS MISC Does not apply by Does not apply route.      Marland Kitchen POLY-IRON 150 150 MG PO CAPS  TAKE 1 CAPSULE BY MOUTH ONCE A DAY. 30 each 6  . POTASSIUM CHLORIDE CR 10 MEQ PO TBCR Oral Take 1 tablet (10 mEq total) by mouth daily. Take with your evening dose of lasix 30 tablet 6  . PREDNISONE 20 MG PO TABS  3 tabs po day one, then 2 po daily x 4 days 11 tablet 0  . TENEX 2 MG PO TABS  TAKE ONE TABLET BY MOUTH AT BEDTIME. 30 each 3  . ZOCOR 10 MG PO TABS  TAKE (1) TABLET BY MOUTH IN THE EVENING. 30 each 2    BP 163/56  Pulse 70  Temp(Src) 99.5 F (37.5 C) (Oral)  Resp 24  Ht 5\' 5"  (1.651 m)  Wt 182 lb (82.555 kg)  BMI 30.29 kg/m2  SpO2 94%  Physical Exam  Nursing note and vitals reviewed. Constitutional: She appears well-developed and well-nourished.       Awake, alert, nontoxic appearance.  HENT:  Head: Normocephalic  and atraumatic.  Mouth/Throat: Oropharynx is clear and moist.       Mucous membranes moist.   Eyes: Right eye exhibits no discharge. Left eye exhibits no discharge.  Neck: Neck supple.  Cardiovascular: Normal rate and regular rhythm.  Exam reveals no gallop and no friction rub.   No murmur heard. Pulmonary/Chest: Effort normal. She has wheezes. She exhibits tenderness.       Cough during exam which is productive of brown-yellow sputum. Scattered mild expiratory wheezing and rhonchi throughout all fields. Mild chest tenderness to palpation.   Abdominal: Soft. Bowel sounds are normal. There is no tenderness. There is no rebound.  Musculoskeletal: She exhibits no tenderness.       Baseline ROM, no obvious new focal weakness. No tenderness to palpation of spine/back.   Lymphadenopathy:    She has no cervical adenopathy.  Neurological:       Mental status and motor strength appears baseline for patient and situation.  Skin: No rash noted.  Psychiatric: She has a normal mood and affect.    ED Course  Procedures (including critical care time) ECG: Normal sinus rhythm, ventricular rate 70, axis normal, left ventricular hypertrophy with repolarization abnormality, no significant change compared with June 2012. DIAGNOSTIC STUDIES: Oxygen Saturation is 98% on 2L-[place], normal by my interpretation.    COORDINATION OF CARE: 9:46AM- Patient and family member informed of imaging results, likely causes of current symptoms. Patient and family questions were entertained and answered. Patient offered option to have blood counts performed and to be admitted but she requests to be discharged home without laboratory testing obtained. Will prescribe medications and albuterol inhaler to treat symptoms at home. Will demonstrate use of inhaler within ED. Informed of reasons to return to ED for evaluation and acknowledges information.   Labs Reviewed - No data to display Dg Chest Portable 1 View  12/27/2010   *RADIOLOGY REPORT*  Clinical Data: Shortness of breath.  Cough  PORTABLE CHEST - 1 VIEW  Comparison: 07/10/2010  Findings: Heart size appears moderately enlarged.  There is moderate pulmonary venous congestion.  There is asymmetric elevation of the left hemidiaphragm.  Opacity within the left lung base is noted consistent with pneumonia or atelectasis.  This appears similar to previous exam.  IMPRESSION: 1.  Cardiac enlargement and pulmonary venous congestion. 2.  Asymmetric elevation of the left hemidiaphragm with left base opacity.  Original Report Authenticated By: Angelita Ingles, M.D.     1. CAP (community acquired pneumonia)   2. Bronchospasm       MDM   Upon presentation, ACS, CHF and sepsis not suspected. Patient offered admission for further evaluation and treatment but requests discharge home.      I personally performed the services described in this documentation, which was scribed in my presence. The recorded information has been reviewed and considered.    Babette Relic, MD 12/27/10 2103

## 2010-12-28 ENCOUNTER — Ambulatory Visit (HOSPITAL_COMMUNITY): Payer: Medicare Other | Admitting: Specialist

## 2010-12-29 NOTE — Progress Notes (Signed)
[name] presents today for injection per MD orders. Aranesp 60 mcg administered SQ in right Upper Arm. Administration without incident. Patient tolerated well.

## 2010-12-30 ENCOUNTER — Telehealth: Payer: Self-pay

## 2010-12-30 NOTE — Telephone Encounter (Signed)
Discussed with nurse who states pt sounds short of breath and has been overusing her inhaler. She is alone.I advised needs to go to Ed in the morning when someone is available to take her , otherwise should go tonight if she gets more short of breath

## 2011-01-02 ENCOUNTER — Other Ambulatory Visit: Payer: Self-pay | Admitting: Family Medicine

## 2011-01-05 ENCOUNTER — Emergency Department (HOSPITAL_COMMUNITY): Payer: Medicare HMO

## 2011-01-05 ENCOUNTER — Encounter (HOSPITAL_COMMUNITY): Payer: Self-pay | Admitting: *Deleted

## 2011-01-05 ENCOUNTER — Inpatient Hospital Stay (HOSPITAL_COMMUNITY)
Admission: EM | Admit: 2011-01-05 | Discharge: 2011-01-13 | DRG: 086 | Disposition: A | Discharge status: Home Health | Payer: Medicare HMO | Attending: Internal Medicine | Admitting: Internal Medicine

## 2011-01-05 ENCOUNTER — Inpatient Hospital Stay (HOSPITAL_COMMUNITY): Payer: Medicare HMO

## 2011-01-05 ENCOUNTER — Other Ambulatory Visit: Payer: Self-pay

## 2011-01-05 DIAGNOSIS — E860 Dehydration: Secondary | ICD-10-CM

## 2011-01-05 DIAGNOSIS — G4440 Drug-induced headache, not elsewhere classified, not intractable: Secondary | ICD-10-CM | POA: Diagnosis present

## 2011-01-05 DIAGNOSIS — E1129 Type 2 diabetes mellitus with other diabetic kidney complication: Secondary | ICD-10-CM | POA: Diagnosis present

## 2011-01-05 DIAGNOSIS — W19XXXA Unspecified fall, initial encounter: Secondary | ICD-10-CM | POA: Diagnosis present

## 2011-01-05 DIAGNOSIS — T463X5A Adverse effect of coronary vasodilators, initial encounter: Secondary | ICD-10-CM | POA: Diagnosis present

## 2011-01-05 DIAGNOSIS — J9610 Chronic respiratory failure, unspecified whether with hypoxia or hypercapnia: Secondary | ICD-10-CM | POA: Diagnosis present

## 2011-01-05 DIAGNOSIS — D72829 Elevated white blood cell count, unspecified: Secondary | ICD-10-CM

## 2011-01-05 DIAGNOSIS — R269 Unspecified abnormalities of gait and mobility: Secondary | ICD-10-CM | POA: Diagnosis present

## 2011-01-05 DIAGNOSIS — E119 Type 2 diabetes mellitus without complications: Secondary | ICD-10-CM | POA: Diagnosis present

## 2011-01-05 DIAGNOSIS — I1311 Hypertensive heart and chronic kidney disease without heart failure, with stage 5 chronic kidney disease, or end stage renal disease: Secondary | ICD-10-CM | POA: Diagnosis present

## 2011-01-05 DIAGNOSIS — I120 Hypertensive chronic kidney disease with stage 5 chronic kidney disease or end stage renal disease: Secondary | ICD-10-CM | POA: Diagnosis present

## 2011-01-05 DIAGNOSIS — I509 Heart failure, unspecified: Secondary | ICD-10-CM | POA: Diagnosis present

## 2011-01-05 DIAGNOSIS — R404 Transient alteration of awareness: Secondary | ICD-10-CM | POA: Diagnosis present

## 2011-01-05 DIAGNOSIS — S06300A Unspecified focal traumatic brain injury without loss of consciousness, initial encounter: Principal | ICD-10-CM | POA: Diagnosis present

## 2011-01-05 DIAGNOSIS — N185 Chronic kidney disease, stage 5: Secondary | ICD-10-CM | POA: Diagnosis present

## 2011-01-05 LAB — CBC
HCT: 33 % — ABNORMAL LOW (ref 36.0–46.0)
MCHC: 32.7 g/dL (ref 30.0–36.0)
RDW: 15.9 % — ABNORMAL HIGH (ref 11.5–15.5)

## 2011-01-05 LAB — HEMOGLOBIN A1C: Mean Plasma Glucose: 166 mg/dL — ABNORMAL HIGH (ref <117)

## 2011-01-05 LAB — DIFFERENTIAL
Basophils Absolute: 0 10*3/uL (ref 0.0–0.1)
Basophils Relative: 0 % (ref 0–1)
Eosinophils Absolute: 0 10*3/uL (ref 0.0–0.7)
Monocytes Absolute: 1 10*3/uL (ref 0.1–1.0)
Neutro Abs: 11.2 10*3/uL — ABNORMAL HIGH (ref 1.7–7.7)

## 2011-01-05 LAB — COMPREHENSIVE METABOLIC PANEL
AST: 9 U/L (ref 0–37)
Albumin: 2.7 g/dL — ABNORMAL LOW (ref 3.5–5.2)
Calcium: 9.1 mg/dL (ref 8.4–10.5)
Chloride: 102 mEq/L (ref 96–112)
Creatinine, Ser: 3.57 mg/dL — ABNORMAL HIGH (ref 0.50–1.10)
Total Bilirubin: 0.3 mg/dL (ref 0.3–1.2)
Total Protein: 6.1 g/dL (ref 6.0–8.3)

## 2011-01-05 LAB — URINALYSIS, ROUTINE W REFLEX MICROSCOPIC
Leukocytes, UA: NEGATIVE
Protein, ur: 100 mg/dL — AB
Urobilinogen, UA: 0.2 mg/dL (ref 0.0–1.0)

## 2011-01-05 LAB — URINE MICROSCOPIC-ADD ON

## 2011-01-05 LAB — GLUCOSE, CAPILLARY: Glucose-Capillary: 280 mg/dL — ABNORMAL HIGH (ref 70–99)

## 2011-01-05 LAB — INFLUENZA PANEL BY PCR (TYPE A & B)
Influenza A By PCR: NEGATIVE
Influenza B By PCR: NEGATIVE

## 2011-01-05 LAB — URIC ACID: Uric Acid, Serum: 12.4 mg/dL — ABNORMAL HIGH (ref 2.4–7.0)

## 2011-01-05 MED ORDER — ENOXAPARIN SODIUM 30 MG/0.3ML ~~LOC~~ SOLN
30.0000 mg | SUBCUTANEOUS | Status: DC
  Administered 2011-01-05 – 2011-01-06 (×2): 30 mg via SUBCUTANEOUS
  Filled 2011-01-05 (×2): qty 0.3

## 2011-01-05 MED ORDER — GLUCERNA SHAKE PO LIQD
237.0000 mL | Freq: Every day | ORAL | Status: DC
  Administered 2011-01-07 – 2011-01-13 (×6): 237 mL via ORAL

## 2011-01-05 MED ORDER — POLYSACCHARIDE IRON COMPLEX 150 MG PO CAPS
150.0000 mg | ORAL_CAPSULE | Freq: Every day | ORAL | Status: DC
Start: 2011-01-05 — End: 2011-01-05

## 2011-01-05 MED ORDER — CALCITRIOL 0.25 MCG PO CAPS
ORAL_CAPSULE | ORAL | Status: AC
  Filled 2011-01-05: qty 1

## 2011-01-05 MED ORDER — BIOTENE DRY MOUTH MT LIQD
15.0000 mL | Freq: Two times a day (BID) | OROMUCOSAL | Status: DC
  Administered 2011-01-05 – 2011-01-13 (×14): 15 mL via OROMUCOSAL

## 2011-01-05 MED ORDER — FLUTICASONE PROPIONATE 50 MCG/ACT NA SUSP
NASAL | Status: AC
  Filled 2011-01-05: qty 16

## 2011-01-05 MED ORDER — INSULIN ASPART PROT & ASPART (70-30 MIX) 100 UNIT/ML ~~LOC~~ SUSP: 5.0000 [IU] | Freq: Two times a day (BID) | SUBCUTANEOUS | Status: DC

## 2011-01-05 MED ORDER — POLYSACCHARIDE IRON 150 MG PO CAPS
150.0000 mg | ORAL_CAPSULE | Freq: Every day | ORAL | Status: DC
  Administered 2011-01-05 – 2011-01-13 (×9): 150 mg via ORAL
  Filled 2011-01-05 (×9): qty 1

## 2011-01-05 MED ORDER — INSULIN ASPART PROT & ASPART (70-30 MIX) 100 UNIT/ML ~~LOC~~ SUSP
8.0000 [IU] | Freq: Every day | SUBCUTANEOUS | Status: DC
  Administered 2011-01-06: 8 [IU] via SUBCUTANEOUS

## 2011-01-05 MED ORDER — ISOSORBIDE DINITRATE ER 40 MG PO TBCR
40.0000 mg | EXTENDED_RELEASE_TABLET | Freq: Two times a day (BID) | ORAL | Status: DC
  Administered 2011-01-05 – 2011-01-09 (×7): 40 mg via ORAL
  Filled 2011-01-05 (×16): qty 1

## 2011-01-05 MED ORDER — AMLODIPINE BESYLATE 5 MG PO TABS
10.0000 mg | ORAL_TABLET | Freq: Every day | ORAL | Status: DC
  Administered 2011-01-05 – 2011-01-13 (×9): 10 mg via ORAL
  Filled 2011-01-05 (×9): qty 2

## 2011-01-05 MED ORDER — SODIUM CHLORIDE 0.9 % IV BOLUS (SEPSIS)
500.0000 mL | Freq: Once | INTRAVENOUS | Status: AC
  Administered 2011-01-05: 500 mL via INTRAVENOUS

## 2011-01-05 MED ORDER — INSULIN ASPART PROT & ASPART (70-30 MIX) 100 UNIT/ML ~~LOC~~ SUSP
5.0000 [IU] | Freq: Every day | SUBCUTANEOUS | Status: DC
  Administered 2011-01-05: 5 [IU] via SUBCUTANEOUS
  Filled 2011-01-05: qty 3

## 2011-01-05 MED ORDER — FUROSEMIDE 40 MG PO TABS
40.0000 mg | ORAL_TABLET | Freq: Two times a day (BID) | ORAL | Status: DC
  Administered 2011-01-05 – 2011-01-07 (×4): 40 mg via ORAL
  Filled 2011-01-05 (×4): qty 1

## 2011-01-05 MED ORDER — SODIUM CHLORIDE 0.9 % IJ SOLN
INTRAMUSCULAR | Status: AC
  Administered 2011-01-05: 21:00:00
  Filled 2011-01-05: qty 3

## 2011-01-05 MED ORDER — INSULIN ASPART 100 UNIT/ML ~~LOC~~ SOLN
0.0000 [IU] | Freq: Three times a day (TID) | SUBCUTANEOUS | Status: DC
  Administered 2011-01-05: 3 [IU] via SUBCUTANEOUS
  Administered 2011-01-06: 2 [IU] via SUBCUTANEOUS
  Administered 2011-01-06: 7 [IU] via SUBCUTANEOUS
  Administered 2011-01-06: 3 [IU] via SUBCUTANEOUS
  Administered 2011-01-07: 2 [IU] via SUBCUTANEOUS
  Administered 2011-01-07: 3 [IU] via SUBCUTANEOUS
  Administered 2011-01-08 (×3): 2 [IU] via SUBCUTANEOUS
  Administered 2011-01-09 (×2): 3 [IU] via SUBCUTANEOUS
  Administered 2011-01-09: 1 [IU] via SUBCUTANEOUS
  Administered 2011-01-10: 5 [IU] via SUBCUTANEOUS
  Administered 2011-01-10: 2 [IU] via SUBCUTANEOUS
  Administered 2011-01-11 (×2): 1 [IU] via SUBCUTANEOUS
  Administered 2011-01-12: 2 [IU] via SUBCUTANEOUS
  Filled 2011-01-05: qty 3

## 2011-01-05 MED ORDER — CARBOXYMETHYLCELLUL-GLYCERIN 0.5-0.9 % OP SOLN
1.0000 [drp] | Freq: Two times a day (BID) | OPHTHALMIC | Status: DC
  Filled 2011-01-05 (×6): qty 5

## 2011-01-05 MED ORDER — HYDRALAZINE HCL 25 MG PO TABS
50.0000 mg | ORAL_TABLET | Freq: Three times a day (TID) | ORAL | Status: DC
  Administered 2011-01-05 – 2011-01-10 (×15): 50 mg via ORAL
  Filled 2011-01-05 (×15): qty 2

## 2011-01-05 MED ORDER — PANTOPRAZOLE SODIUM 40 MG PO TBEC: 40.0000 mg | DELAYED_RELEASE_TABLET | Freq: Every day | ORAL | Status: DC

## 2011-01-05 MED ORDER — PANTOPRAZOLE SODIUM 40 MG PO TBEC
80.0000 mg | DELAYED_RELEASE_TABLET | Freq: Every day | ORAL | Status: DC
  Administered 2011-01-05 – 2011-01-13 (×9): 80 mg via ORAL
  Filled 2011-01-05 (×9): qty 2

## 2011-01-05 MED ORDER — GABAPENTIN 600 MG PO TABS
300.0000 mg | ORAL_TABLET | Freq: Every day | ORAL | Status: DC
  Administered 2011-01-05: 300 mg via ORAL
  Filled 2011-01-05: qty 0.5

## 2011-01-05 MED ORDER — CITALOPRAM HYDROBROMIDE 20 MG PO TABS
10.0000 mg | ORAL_TABLET | Freq: Every day | ORAL | Status: DC
Start: 2011-01-05 — End: 2011-01-13
  Administered 2011-01-05 – 2011-01-12 (×8): 10 mg via ORAL
  Administered 2011-01-13: 11:00:00 via ORAL
  Filled 2011-01-05 (×9): qty 1

## 2011-01-05 MED ORDER — FLUTICASONE PROPIONATE 50 MCG/ACT NA SUSP
2.0000 | Freq: Every day | NASAL | Status: DC
  Administered 2011-01-05 – 2011-01-13 (×9): 2 via NASAL
  Filled 2011-01-05 (×2): qty 16

## 2011-01-05 MED ORDER — SIMVASTATIN 20 MG PO TABS
20.0000 mg | ORAL_TABLET | Freq: Every day | ORAL | Status: DC
  Administered 2011-01-05 – 2011-01-12 (×8): 20 mg via ORAL
  Filled 2011-01-05 (×8): qty 1

## 2011-01-05 MED ORDER — POTASSIUM CHLORIDE CRYS ER 10 MEQ PO TBCR
10.0000 meq | EXTENDED_RELEASE_TABLET | Freq: Every day | ORAL | Status: DC
  Administered 2011-01-05 – 2011-01-07 (×3): 10 meq via ORAL
  Filled 2011-01-05 (×5): qty 1

## 2011-01-05 MED ORDER — CALCITRIOL 0.25 MCG PO CAPS
0.2500 ug | ORAL_CAPSULE | Freq: Every day | ORAL | Status: DC
  Administered 2011-01-05 – 2011-01-13 (×8): 0.25 ug via ORAL
  Filled 2011-01-05 (×12): qty 1

## 2011-01-05 MED ORDER — DEXTROSE 5 % IV SOLN
INTRAVENOUS | Status: AC
  Filled 2011-01-05: qty 10

## 2011-01-05 MED ORDER — GABAPENTIN 300 MG PO CAPS
ORAL_CAPSULE | ORAL | Status: AC
  Filled 2011-01-05: qty 1

## 2011-01-05 MED ORDER — SODIUM CHLORIDE 0.9 % IJ SOLN
INTRAMUSCULAR | Status: AC
  Administered 2011-01-05: 23:00:00
  Filled 2011-01-05: qty 3

## 2011-01-05 MED ORDER — DEXTROSE 5 % IV SOLN
1.0000 g | INTRAVENOUS | Status: DC
  Administered 2011-01-05 – 2011-01-06 (×2): 1 g via INTRAVENOUS
  Filled 2011-01-05 (×3): qty 10

## 2011-01-05 NOTE — ED Notes (Signed)
Fever. AMS x 2 days. Weakness. Hx of chronic UTI's. Sent here by Home health nurse this morning.

## 2011-01-05 NOTE — H&P (Signed)
PCP:   Vic Blackbird, MD, MD   Chief Complaint:   right leg pain and weakness and lethargic  HPI: This is an 77 year old female with a past medical history significant for chronic renal insufficiency stage IV, hypertension, history of stroke with right hemiparesis , diabetes mellitus, history obtained from the patient as there is no family around, patient is lethargic during conversation, she complain of experienced a fall 3 days ago, hitting her head really hard, since then she complain of right ankle pain and foot pain, and she become weak, very lethargic with report from the emergency room who did the conversation with the son that she have fever associated with chills, currently patient denies any nausea or vomiting, denies any change in bowel habit, denies any abdominal pain, she denies any hip pain she admitted heart pain mainly in her foot and ankle, she denies any shortness of breath,, denies any hemoptysis, or hematemesis, on the emergency room patient has a CT head without contrast which was negative for bleeding and CT cervical spine which was negative,  Review of Systems:  unable to obtain further history secondary to lethargic Past Medical History: Past Medical History  Diagnosis Date  . Anxiety   . Diabetes mellitus, type 2   . Hypertension   . Stroke     residual right side weakness and pain  . IBS (irritable bowel syndrome)     with costipation  . Overactive bladder   . Cataracts, both eyes   . Gynecologic cancer   . Hyperlipidemia   . CKD (chronic kidney disease), stage V   . Anemia in CKD (chronic kidney disease)   . SVT (supraventricular tachycardia)   . CHF (congestive heart failure)   . Arthritis     gout  . SOB (shortness of breath)     nml spirometry 09/05/06   Past Surgical History  Procedure Date  . Vesicovaginal fistula closure w/ tah   . Av fistula placement, brachiocephalic [id number]    left arm   by Dr. Bridgett Larsson  . Tee without cardioversion 2011   2/2 Strep Bovis infection    Medications: Prior to Admission medications   Medication Sig Start Date End Date Taking? Authorizing Provider  albuterol (PROVENTIL HFA;VENTOLIN HFA) 108 (90 BASE) MCG/ACT inhaler Inhale 2 puffs into the lungs every 2 (two) hours as needed for wheezing or shortness of breath (cough). 12/27/10 12/27/11 Yes Babette Relic, MD  calcitRIOL (ROCALTROL) 0.25 MCG capsule Take 0.25 mcg by mouth daily.     Yes Historical Provider, MD  Carboxymethylcellul-Glycerin (OPTIVE) 0.5-0.9 % SOLN Apply to eye. One drop in both eyes two times a day     Yes Historical Provider, MD  citalopram (CELEXA) 20 MG tablet TAKE (1) TABLET BY MOUTH ONCE DAILY. 11/04/10  Yes Vic Blackbird, MD  feeding supplement (GLUCERNA SHAKE) LIQD Take 237 mLs by mouth daily.     Yes Historical Provider, MD  furosemide (LASIX) 20 MG tablet Take 20mg  in the evening. 10/28/10 10/27/13 Yes Vic Blackbird, MD  furosemide (LASIX) 40 MG tablet TAKE 1 TABLET ONCE DAILY. 10/06/10  Yes Vic Blackbird, MD  gabapentin (NEURONTIN) 600 MG tablet TAKE ONE TABLET BY MOUTH AT BEDTIME. 11/09/10  Yes Vic Blackbird, MD  glucose blood test strip 1 each by Other route as needed. Use as instructed    Yes Historical Provider, MD  hydrALAZINE (APRESOLINE) 50 MG tablet TAKE 1 TABLET EVERY 8 HOURS 11/04/10  Yes Vic Blackbird, MD  insulin aspart protamine-insulin aspart (NOVOLOG  MIX 70/30 FLEXPEN) (70-30) 100 UNIT/ML injection Inject 5-8 Units into the skin 2 (two) times daily with a meal. 8 units every morning ,5units every evening Patient does not use insulin if her CBG is lower than 150   Yes Historical Provider, MD  Insulin Pen Needle 29G X 12MM MISC Inject into the skin. Use with insuline pen two times a day    Yes Historical Provider, MD  isosorbide dinitrate (ISOCHRON) 40 MG CR tablet TAKE 1 TABLET BY MOUTH 3 TIMES DAILY. 11/04/10  Yes Vic Blackbird, MD  NEXIUM 40 MG capsule TAKE 1 CAPSULE BY MOUTH ONCE A DAY. 11/09/10  Yes  Vic Blackbird, MD  NORVASC 10 MG tablet TAKE (1) TABLET BY MOUTH ONCE DAILY. 11/04/10  Yes Vic Blackbird, MD  ONE TOUCH LANCETS MISC by Does not apply route.     Yes Historical Provider, MD  POLY-IRON 150 150 MG capsule TAKE 1 CAPSULE BY MOUTH ONCE A DAY. 11/02/10  Yes Vic Blackbird, MD  TENEX 2 MG tablet TAKE ONE TABLET BY MOUTH AT BEDTIME. 11/04/10  Yes Vic Blackbird, MD  fluticasone [facility]) 50 MCG/ACT nasal spray 2 sprays by Nasal route daily.      Historical Provider, MD  potassium chloride (K-DUR) 10 MEQ tablet Take 1 tablet (10 mEq total) by mouth daily. Take with your evening dose of lasix 10/28/10 10/28/11  Vic Blackbird, MD  ZOCOR 10 MG tablet TAKE (1) TABLET BY MOUTH IN THE EVENING. 10/06/10   Vic Blackbird, MD    Allergies:  No Known Allergies  Social History:  reports that she has never smoked. She does not have any smokeless tobacco history on file. She reports that she does not drink alcohol or use illicit drugs.  Family History: History reviewed. No pertinent family history.  Physical Exam: Filed Vitals:   01/05/11 1150 01/05/11 1200 01/05/11 1400 01/05/11 1500  BP: 142/52 138/51 133/47 130/53  Pulse: 53 64 53   Temp:      TempSrc:      Resp: 12 12 18 13   Height:      Weight:      SpO2: 100% 100% 99%    She is sleepy during conversation, she is not on respiratory distress or shortness of breath, mucosa moist with no oral thrush, no lymph nodes, or thyromegaly Lungs examination without wheezing or rales\ Heart S1 and S2 with no added sound no gallop Abdomen some ecchymosis on the right lower iliac area, surgical scar well-healed abdomen soft with no organomegaly, and bowel sounds present Extremities peripheral pulses diminished bilaterally, and the exam of 10 no evidence of gangrene, right leg is tender to palpation mainly at the ankle, there is no joint swelling, bilateral knee joint with chronic deformity and swelling, patient able to move both lower  extremities and upper extremities, and there is no evidence of focal deficit There is some ecchymosis and the right upper thigh, equal strength on upper extremities but has lower extremity weakness more noticed on the right lower extremites, the right leg is tender    on Admission:   [facility] 01/05/11 1215  NA 135  K 4.5  CL 102  CO2 24  GLUCOSE 243*  BUN 101*  CREATININE 3.57*  CALCIUM 9.1  MG --  PHOS --    Basename 01/05/11 1215  AST 9  ALT 5  ALKPHOS 58  BILITOT 0.3  PROT 6.1  ALBUMIN 2.7*   No results found for this basename: LIPASE:2,AMYLASE:2 in the last 72 hours  Basename 01/05/11 1215  WBC 13.1*  NEUTROABS 11.2*  HGB 10.8*  HCT 33.0*  MCV 73.3*  PLT 169    Basename 01/05/11 1215  CKTOTAL --  CKMB --  CKMBINDEX --  TROPONINI <0.30   No results found for this basename: TSH,T4TOTAL,FREET3,T3FREE,THYROIDAB in the last 72 hours No results found for this basename: VITAMINB12:2,FOLATE:2,FERRITIN:2,TIBC:2,IRON:2,RETICCTPCT:2 in the last 72 hours  Radiological Exams on Admission: Dg Chest 1 View  01/05/2011  *RADIOLOGY REPORT*  Clinical Data: Fever, altered level of consciousness for several days  CHEST - 1 VIEW  Comparison: Portable chest x-ray of 12/27/2010  Findings: Moderate cardiomegaly remains with perhaps mild pulmonary vascular congestion.  Slight elevation of the left hemidiaphragm is stable.  No acute bony abnormality is seen.  IMPRESSION: Stable moderate cardiomegaly with question of mild pulmonary vascular congestion.  Original Report Authenticated By: Joretta Bachelor, M.D.   Ct Head Wo Contrast  01/05/2011  *RADIOLOGY REPORT*  Clinical Data:  Of 2 days ago with bruising in the right side of the neck, altered mental status, weakness  CT HEAD WITHOUT CONTRAST CT CERVICAL SPINE WITHOUT CONTRAST  Technique:  Multidetector CT imaging of the head and cervical spine was performed following the standard protocol without intravenous contrast.  Multiplanar CT  image reconstructions of the cervical spine were also generated.  Comparison:  CT brain scan of 04/30/2010  CT HEAD  Findings: The ventricular system remains prominent, as are the cortical sulci and cerebellar folia, consistent with diffuse atrophy.  The septum is midline position mild small vessel ischemic changes present in the prep ventricle white matter.  No hemorrhage, mass lesion, or acute infarction is seen.  There is mucosal thickening in the left maxillary sinus but no air-fluid level is seen.  Some opacification of ethmoid air cells is present and there is a small amount of fluid within the left partition of the sphenoid sinus as well the opacification of the right frontal sinus is noted.  On bone window images no acute calvarial abnormality is seen.  IMPRESSION:  1.  Stable atrophy and small vessel disease.  No acute intracranial abnormality. 2.  Diffuse sinus disease.  CT CERVICAL SPINE  Findings: The cervical vertebrae are slightly straightened in alignment.  There is diffuse degenerative change with some loss of joint space and spurring.  However no acute cervical spine fracture is seen.  The odontoid process is intact.  No prevertebral soft tissue swelling is noted.  There are the thyroid gland is slightly prominent and inhomogeneous with possible low attenuation nodule on the left.  IMPRESSION: Straightened alignment with diffuse degenerative change in osteopenia.  No acute abnormality.  Original Report Authenticated By: Joretta Bachelor, M.D.   Ct Cervical Spine Wo Contrast  01/05/2011  *RADIOLOGY REPORT*  Clinical Data:  Of 2 days ago with bruising in the right side of the neck, altered mental status, weakness  CT HEAD WITHOUT CONTRAST CT CERVICAL SPINE WITHOUT CONTRAST  Technique:  Multidetector CT imaging of the head and cervical spine was performed following the standard protocol without intravenous contrast.  Multiplanar CT image reconstructions of the cervical spine were also generated.   Comparison:  CT brain scan of 04/30/2010  CT HEAD  Findings: The ventricular system remains prominent, as are the cortical sulci and cerebellar folia, consistent with diffuse atrophy.  The septum is midline position mild small vessel ischemic changes present in the prep ventricle white matter.  No hemorrhage, mass lesion, or acute infarction is seen.  There is mucosal  thickening in the left maxillary sinus but no air-fluid level is seen.  Some opacification of ethmoid air cells is present and there is a small amount of fluid within the left partition of the sphenoid sinus as well the opacification of the right frontal sinus is noted.  On bone window images no acute calvarial abnormality is seen.  IMPRESSION:  1.  Stable atrophy and small vessel disease.  No acute intracranial abnormality. 2.  Diffuse sinus disease.  CT CERVICAL SPINE  Findings: The cervical vertebrae are slightly straightened in alignment.  There is diffuse degenerative change with some loss of joint space and spurring.  However no acute cervical spine fracture is seen.  The odontoid process is intact.  No prevertebral soft tissue swelling is noted.  There are the thyroid gland is slightly prominent and inhomogeneous with possible low attenuation nodule on the left.  IMPRESSION: Straightened alignment with diffuse degenerative change in osteopenia.  No acute abnormality.  Original Report Authenticated By: Joretta Bachelor, M.D.   Dg Chest Portable 1 View  12/27/2010  *RADIOLOGY REPORT*  Clinical Data: Shortness of breath.  Cough  PORTABLE CHEST - 1 VIEW  Comparison: 07/10/2010  Findings: Heart size appears moderately enlarged.  There is moderate pulmonary venous congestion.  There is asymmetric elevation of the left hemidiaphragm.  Opacity within the left lung base is noted consistent with pneumonia or atelectasis.  This appears similar to previous exam.  IMPRESSION: 1.  Cardiac enlargement and pulmonary venous congestion. 2.  Asymmetric elevation  of the left hemidiaphragm with left base opacity.  Original Report Authenticated By: Angelita Ingles, M.D.   Dg Knee Complete 4 Views Right  01/05/2011  *RADIOLOGY REPORT*  Clinical Data: [place] several days ago with pain  RIGHT KNEE - COMPLETE 4+ VIEW  Comparison: MR right knee of 02/25/2010  Findings: Tricompartmental degenerative joint disease is again noted with loss of joint space, sclerosis and spurring involving the compartment.  There is also chondrocalcinosis present which can be seen with CPPD or osteoarthritis.  No acute fracture is seen.  A small right knee joint effusion is present.  IMPRESSION: 1.  Significant tricompartmental degenerative joint disease with small joint effusion.  No fracture. 2.  Chondrocalcinosis.  Original Report Authenticated By: Joretta Bachelor, M.D.    Assessment/Plan 1- lethargy and weakness with low-grade fever would like to exclude infection would proceed with urinalysis and culture, blood culture, flu panel I will start the patient empirically on ceftriaxone. Also will proceed with MRI OF THE BRAIN  To exclude stroke 2-right lower extremity pain cause not clear to me it could be a confusion from the fall but we need to exclude any fracture I would proceed with x-ray of the hip and ankle, with check uric acid, would check venous Doppler of bilateral lower extremities, would involve physical therapy 3-chronic kidney disease stage IV 4-to stroke malignant hypertension would resume home medication 5-deconditioning but involves physical therapy 1-Deundre Thong I. 01/05/2011, 4:44 PM

## 2011-01-05 NOTE — ED Notes (Signed)
Pt c/o pain to legs bilat from a fall 2 days ago. Pt also c/o fever.

## 2011-01-05 NOTE — ED Provider Notes (Signed)
History     CSN: [id number]  Arrival date & time 01/05/11  0905   First MD Initiated Contact with Patient 01/05/11 [phone]      Chief Complaint  Patient presents with  . Fever    (Consider location/radiation/quality/duration/timing/severity/associated sxs/prior treatment) HPI Comments: Patient is normally awake,  Alert,  Walks on her own with no confusion or dementia.  She reports falling getting out of the bathtub 2 days ago,  Hitting her head "really hard".  Since this event,  She has had complaint of right knee pain and also has become increasingly lethargic.  She does have a home health aide who sent her here for further evaluation.  Reported history of fever,  Patient and son not aware of such.  Patient is a 77 y.o. female presenting with fever. The history is provided by the patient (Son at bedside gives most of patients history.).  Fever Primary symptoms of the febrile illness include fever and altered mental status. Primary symptoms do not include headaches, cough, shortness of breath, abdominal pain, nausea, arthralgias or rash.    Past Medical History  Diagnosis Date  . Anxiety   . Diabetes mellitus, type 2   . Hypertension   . Stroke     residual right side weakness and pain  . IBS (irritable bowel syndrome)     with costipation  . Overactive bladder   . Cataracts, both eyes   . Gynecologic cancer   . Hyperlipidemia   . CKD (chronic kidney disease), stage V   . Anemia in CKD (chronic kidney disease)   . SVT (supraventricular tachycardia)   . CHF (congestive heart failure)   . Arthritis     gout  . SOB (shortness of breath)     nml spirometry 09/05/06    Past Surgical History  Procedure Date  . Vesicovaginal fistula closure w/ tah   . Av fistula placement, brachiocephalic [id number]    left arm   by Dr. Bridgett Larsson  . Tee without cardioversion 2011    2/2 Strep Bovis infection    No family history on file.  History  Substance Use Topics  . Smoking status:  Never Smoker   . Smokeless tobacco: Not on file  . Alcohol Use: No    OB History    Grav Para Term Preterm Abortions TAB SAB Ect Mult Living                  Review of Systems  Constitutional: Positive for fever.  HENT: Negative for ear pain, congestion, sore throat, trouble swallowing and neck pain.   Eyes: Negative.   Respiratory: Negative for cough, chest tightness and shortness of breath.        Son reports she had pneumonia a month ago,  But cough and sob has resolved.  Cardiovascular: Negative for chest pain.  Gastrointestinal: Negative for nausea and abdominal pain.  Genitourinary: Negative.   Musculoskeletal: Negative for joint swelling and arthralgias.  Skin: Negative.  Negative for rash and wound.  Neurological: Negative for dizziness, weakness, light-headedness, numbness and headaches.  Hematological: Negative.   Psychiatric/Behavioral: Positive for altered mental status. Negative for confusion and decreased concentration.    Allergies  Review of patient's allergies indicates no known allergies.  Home Medications   Current Outpatient Rx  Name Route Sig Dispense Refill  . ALBUTEROL SULFATE HFA 108 (90 BASE) MCG/ACT IN AERS Inhalation Inhale 2 puffs into the lungs every 2 (two) hours as needed for wheezing or shortness of breath (  cough). 1 Inhaler 0  . CALCITRIOL 0.25 MCG PO CAPS Oral Take 0.25 mcg by mouth daily.      Marland Kitchen CARBOXYMETHYLCELLUL-GLYCERIN 0.5-0.9 % OP SOLN Ophthalmic Apply to eye. One drop in both eyes two times a day      . CITALOPRAM HYDROBROMIDE 20 MG PO TABS  TAKE (1) TABLET BY MOUTH ONCE DAILY. 30 tablet 3  . GLUCERNA SHAKE PO LIQD Oral Take 237 mLs by mouth daily.      . FUROSEMIDE 20 MG PO TABS  Take 20mg  in the evening. 30 tablet 6  . FUROSEMIDE 40 MG PO TABS  TAKE 1 TABLET ONCE DAILY. 30 tablet 2  . GABAPENTIN 600 MG PO TABS  TAKE ONE TABLET BY MOUTH AT BEDTIME. 30 tablet 6  . GLUCOSE BLOOD VI STRP Other 1 each by Other route as needed. Use as  instructed     . HYDRALAZINE HCL 50 MG PO TABS  TAKE 1 TABLET EVERY 8 HOURS 90 tablet 3  . INSULIN ASPART PROT & ASPART (70-30) 100 UNIT/ML [place] SUSP Subcutaneous Inject 5-8 Units into the skin 2 (two) times daily with a meal. 8 units every morning ,5units every evening Patient does not use insulin if her CBG is lower than 150    . INSULIN PEN NEEDLE 29G X 12MM MISC Subcutaneous Inject into the skin. Use with insuline pen two times a day     . ISOSORBIDE DINITRATE ER 40 MG PO TBCR  TAKE 1 TABLET BY MOUTH 3 TIMES DAILY. 90 tablet 3  . NEXIUM 40 MG PO CPDR  TAKE 1 CAPSULE BY MOUTH ONCE A DAY. 30 each 6  . NORVASC 10 MG PO TABS  TAKE (1) TABLET BY MOUTH ONCE DAILY. 30 each 3  . ONETOUCH LANCETS MISC Does not apply by Does not apply route.      Marland Kitchen POLY-IRON 150 150 MG PO CAPS  TAKE 1 CAPSULE BY MOUTH ONCE A DAY. 30 each 6  . TENEX 2 MG PO TABS  TAKE ONE TABLET BY MOUTH AT BEDTIME. 30 each 3  . FLUTICASONE PROPIONATE 50 MCG/ACT NA SUSP Nasal 2 sprays by Nasal route daily.      Marland Kitchen POTASSIUM CHLORIDE CR 10 MEQ PO TBCR Oral Take 1 tablet (10 mEq total) by mouth daily. Take with your evening dose of lasix 30 tablet 6  . ZOCOR 10 MG PO TABS  TAKE (1) TABLET BY MOUTH IN THE EVENING. 30 each 2    BP 138/51  Pulse 64  Temp(Src) 99.1 F (37.3 C) (Oral)  Resp 12  Ht 5\' 5"  (1.651 m)  Wt 183 lb (83.008 kg)  BMI 30.45 kg/m2  SpO2 100%  Physical Exam  Nursing note and vitals reviewed. Constitutional: She appears well-developed and well-nourished. She appears lethargic.  HENT:  Head: Normocephalic. Head is with Battle's sign and with contusion.    Right Ear: Tympanic membrane normal. No hemotympanum.  Left Ear: Tympanic membrane normal. No hemotympanum.  Nose: Nose normal.  Mouth/Throat: Mucous membranes are dry.  Eyes: Conjunctivae are normal.  Neck: Normal range of motion.  Cardiovascular: Normal rate, regular rhythm, normal heart sounds and intact distal pulses.   Pulmonary/Chest: Effort normal  and breath sounds normal. She has no wheezes.  Abdominal: Soft. Bowel sounds are normal. There is no tenderness.  Musculoskeletal: She exhibits edema and tenderness.       Right knee ttp with no visible trauma.  Neurological: She has normal strength. She appears lethargic. No cranial nerve  deficit or sensory deficit. GCS eye subscore is 4. GCS verbal subscore is 5. GCS motor subscore is 6.       Patient very drowsy,  Answers questions appropriately and follows commands,  But then promptly drifts off to sleep.  Equal grip strength.  Skin: Skin is warm and dry.  Psychiatric: She has a normal mood and affect.    ED Course  Procedures (including critical care time)  Labs Reviewed  URINALYSIS, ROUTINE W REFLEX MICROSCOPIC - Abnormal; Notable for the following:    Hgb urine dipstick TRACE (*)    Protein, ur 100 (*)    All other components within normal limits  CBC - Abnormal; Notable for the following:    WBC 13.1 (*)    Hemoglobin 10.8 (*)    HCT 33.0 (*)    MCV 73.3 (*)    MCH 24.0 (*)    RDW 15.9 (*)    All other components within normal limits  DIFFERENTIAL - Abnormal; Notable for the following:    Neutrophils Relative 85 (*)    Neutro Abs 11.2 (*)    Lymphocytes Relative 7 (*)    All other components within normal limits  COMPREHENSIVE METABOLIC PANEL - Abnormal; Notable for the following:    Glucose, Bld 243 (*)    BUN 101 (*)    Creatinine, Ser 3.57 (*)    Albumin 2.7 (*)    GFR calc non Af Amer 11 (*)    GFR calc Af Amer 13 (*)    All other components within normal limits  URINE MICROSCOPIC-ADD ON  TROPONIN I   Dg Chest 1 View  01/05/2011  *RADIOLOGY REPORT*  Clinical Data: Fever, altered level of consciousness for several days  CHEST - 1 VIEW  Comparison: Portable chest x-ray of 12/27/2010  Findings: Moderate cardiomegaly remains with perhaps mild pulmonary vascular congestion.  Slight elevation of the left hemidiaphragm is stable.  No acute bony abnormality is seen.   IMPRESSION: Stable moderate cardiomegaly with question of mild pulmonary vascular congestion.  Original Report Authenticated By: Joretta Bachelor, M.D.   Ct Head Wo Contrast  01/05/2011  *RADIOLOGY REPORT*  Clinical Data:  Of 2 days ago with bruising in the right side of the neck, altered mental status, weakness  CT HEAD WITHOUT CONTRAST CT CERVICAL SPINE WITHOUT CONTRAST  Technique:  Multidetector CT imaging of the head and cervical spine was performed following the standard protocol without intravenous contrast.  Multiplanar CT image reconstructions of the cervical spine were also generated.  Comparison:  CT brain scan of 04/30/2010  CT HEAD  Findings: The ventricular system remains prominent, as are the cortical sulci and cerebellar folia, consistent with diffuse atrophy.  The septum is midline position mild small vessel ischemic changes present in the prep ventricle white matter.  No hemorrhage, mass lesion, or acute infarction is seen.  There is mucosal thickening in the left maxillary sinus but no air-fluid level is seen.  Some opacification of ethmoid air cells is present and there is a small amount of fluid within the left partition of the sphenoid sinus as well the opacification of the right frontal sinus is noted.  On bone window images no acute calvarial abnormality is seen.  IMPRESSION:  1.  Stable atrophy and small vessel disease.  No acute intracranial abnormality. 2.  Diffuse sinus disease.  CT CERVICAL SPINE  Findings: The cervical vertebrae are slightly straightened in alignment.  There is diffuse degenerative change with some loss of joint  space and spurring.  However no acute cervical spine fracture is seen.  The odontoid process is intact.  No prevertebral soft tissue swelling is noted.  There are the thyroid gland is slightly prominent and inhomogeneous with possible low attenuation nodule on the left.  IMPRESSION: Straightened alignment with diffuse degenerative change in osteopenia.  No acute  abnormality.  Original Report Authenticated By: Joretta Bachelor, M.D.   Ct Cervical Spine Wo Contrast  01/05/2011  *RADIOLOGY REPORT*  Clinical Data:  Of 2 days ago with bruising in the right side of the neck, altered mental status, weakness  CT HEAD WITHOUT CONTRAST CT CERVICAL SPINE WITHOUT CONTRAST  Technique:  Multidetector CT imaging of the head and cervical spine was performed following the standard protocol without intravenous contrast.  Multiplanar CT image reconstructions of the cervical spine were also generated.  Comparison:  CT brain scan of 04/30/2010  CT HEAD  Findings: The ventricular system remains prominent, as are the cortical sulci and cerebellar folia, consistent with diffuse atrophy.  The septum is midline position mild small vessel ischemic changes present in the prep ventricle white matter.  No hemorrhage, mass lesion, or acute infarction is seen.  There is mucosal thickening in the left maxillary sinus but no air-fluid level is seen.  Some opacification of ethmoid air cells is present and there is a small amount of fluid within the left partition of the sphenoid sinus as well the opacification of the right frontal sinus is noted.  On bone window images no acute calvarial abnormality is seen.  IMPRESSION:  1.  Stable atrophy and small vessel disease.  No acute intracranial abnormality. 2.  Diffuse sinus disease.  CT CERVICAL SPINE  Findings: The cervical vertebrae are slightly straightened in alignment.  There is diffuse degenerative change with some loss of joint space and spurring.  However no acute cervical spine fracture is seen.  The odontoid process is intact.  No prevertebral soft tissue swelling is noted.  There are the thyroid gland is slightly prominent and inhomogeneous with possible low attenuation nodule on the left.  IMPRESSION: Straightened alignment with diffuse degenerative change in osteopenia.  No acute abnormality.  Original Report Authenticated By: Joretta Bachelor, M.D.    Dg Knee Complete 4 Views Right  01/05/2011  *RADIOLOGY REPORT*  Clinical Data: [place] several days ago with pain  RIGHT KNEE - COMPLETE 4+ VIEW  Comparison: MR right knee of 02/25/2010  Findings: Tricompartmental degenerative joint disease is again noted with loss of joint space, sclerosis and spurring involving the compartment.  There is also chondrocalcinosis present which can be seen with CPPD or osteoarthritis.  No acute fracture is seen.  A small right knee joint effusion is present.  IMPRESSION: 1.  Significant tricompartmental degenerative joint disease with small joint effusion.  No fracture. 2.  Chondrocalcinosis.  Original Report Authenticated By: Joretta Bachelor, M.D.     1. Altered level of consciousness   2. Dehydration   3. Leukocytosis       MDM  AMs,  Fever of unclear etiology.  Call to Dr. Laurie Panda who will admit pt for further eval/observation.    Date: 01/05/2011  Rate: 55  Rhythm: normal sinus rhythm  QRS Axis: normal  Intervals: normal  ST/T Wave abnormalities: T wave inversion V4-V6,  unchanged from previous ekg.  Conduction Disutrbances:first-degree A-V block   Narrative Interpretation:   Old EKG Reviewed: unchanged          Fulton Reek, PA 01/05/11 1444  Fulton Reek, [place] 01/05/11 1544

## 2011-01-05 NOTE — ED Provider Notes (Signed)
Medical screening examination/treatment/procedure(s) were conducted as a shared visit with non-physician practitioner(s) and myself.  I personally evaluated the patient during the encounter  Patient with a altered mental status for the past 2 days and weakness. Extensive workup in the emergency department without specific findings. Temp of 99.1 here. Was initially concerned about some sort of head bleed, head CT negative chest x-ray negative for pneumonia. Patient lives at home with other family members. Not clear exactly what: The patient has not alert enough to go home needs admission and observation. Has been arranged with the hospitalist internal medicine team.  Mervin Kung, MD 01/05/11 1536

## 2011-01-05 NOTE — ED Notes (Signed)
Pt requesting food. Pt advocate going to cafeteria to get food for pt.

## 2011-01-05 NOTE — ED Notes (Signed)
Urine obtained via straight catheter using sterile technique. Patient tolerated procedure well.

## 2011-01-05 NOTE — ED Notes (Signed)
Pt also states pain to entire right leg from fall 2 days ago.

## 2011-01-06 ENCOUNTER — Inpatient Hospital Stay (HOSPITAL_COMMUNITY): Payer: Medicare HMO

## 2011-01-06 LAB — CBC
HCT: 31.7 % — ABNORMAL LOW (ref 36.0–46.0)
Hemoglobin: 10.3 g/dL — ABNORMAL LOW (ref 12.0–15.0)
MCH: 24 pg — ABNORMAL LOW (ref 26.0–34.0)
MCHC: 32.5 g/dL (ref 30.0–36.0)
MCV: 73.7 fL — ABNORMAL LOW (ref 78.0–100.0)

## 2011-01-06 LAB — COMPREHENSIVE METABOLIC PANEL
Alkaline Phosphatase: 51 U/L (ref 39–117)
BUN: 97 mg/dL — ABNORMAL HIGH (ref 6–23)
Creatinine, Ser: 3.64 mg/dL — ABNORMAL HIGH (ref 0.50–1.10)
GFR calc Af Amer: 12 mL/min — ABNORMAL LOW (ref >90)
Glucose, Bld: 213 mg/dL — ABNORMAL HIGH (ref 70–99)
Potassium: 4.6 mEq/L (ref 3.5–5.1)
Total Protein: 5.8 g/dL — ABNORMAL LOW (ref 6.0–8.3)

## 2011-01-06 LAB — GLUCOSE, CAPILLARY
Glucose-Capillary: 237 mg/dL — ABNORMAL HIGH (ref 70–99)
Glucose-Capillary: 175 mg/dL — ABNORMAL HIGH (ref 70–99)

## 2011-01-06 LAB — OCCULT BLOOD X 1 CARD TO LAB, STOOL: Fecal Occult Bld: NEGATIVE

## 2011-01-06 MED ORDER — GABAPENTIN 300 MG PO CAPS
300.0000 mg | ORAL_CAPSULE | Freq: Every day | ORAL | Status: DC
  Administered 2011-01-06 – 2011-01-12 (×7): 300 mg via ORAL
  Filled 2011-01-06 (×7): qty 1

## 2011-01-06 MED ORDER — SODIUM CHLORIDE 0.9 % IV SOLN
INTRAVENOUS | Status: DC
  Administered 2011-01-06 – 2011-01-09 (×6): via INTRAVENOUS

## 2011-01-06 MED ORDER — INSULIN ASPART PROT & ASPART (70-30 MIX) 100 UNIT/ML ~~LOC~~ SUSP
10.0000 [IU] | Freq: Every day | SUBCUTANEOUS | Status: DC
  Administered 2011-01-07 – 2011-01-13 (×6): 10 [IU] via SUBCUTANEOUS

## 2011-01-06 MED ORDER — INSULIN ASPART PROT & ASPART (70-30 MIX) 100 UNIT/ML ~~LOC~~ SUSP
10.0000 [IU] | Freq: Every day | SUBCUTANEOUS | Status: DC
  Administered 2011-01-06 – 2011-01-12 (×7): 10 [IU] via SUBCUTANEOUS
  Filled 2011-01-06: qty 3

## 2011-01-06 NOTE — Progress Notes (Signed)
Physical Therapy Evaluation Patient Details Name: [name] MRN: [id number] DOB: Aug 18, 1925 Today's Date: 01/06/2011 Time:11:05-11:27 Charges: 1EV  Problem List:  Patient Active Problem List  Diagnoses  . DIABETES MELLITUS, CONTROLLED, WITH COMPLICATIONS  . HYPERLIPIDEMIA  . ANEMIA OF RENAL FAILURE  . ANXIETY  . CATARACT NOS  . HYPERTENSION  . CONGESTIVE HEART FAILURE UNSPECIFIED  . ALLERGIC RHINITIS  . IBS  . CHRONIC KIDNEY DISEASE STAGE V  . ARTHRITIS  . DEPENDENT EDEMA, LEGS, BILATERAL  . DYSPNEA ON EXERTION  . CEREBROVASCULAR ACCIDENT, HX OF  . Arm bruise  . Colon polyps  . Insomnia  . Fall  . Constipation    Past Medical History:  Past Medical History  Diagnosis Date  . Anxiety   . Diabetes mellitus, type 2   . Hypertension   . Stroke     residual right side weakness and pain  . IBS (irritable bowel syndrome)     with costipation  . Overactive bladder   . Cataracts, both eyes   . Gynecologic cancer   . Hyperlipidemia   . CKD (chronic kidney disease), stage V   . Anemia in CKD (chronic kidney disease)   . SVT (supraventricular tachycardia)   . CHF (congestive heart failure)   . Arthritis     gout  . SOB (shortness of breath)     nml spirometry 09/05/06   Past Surgical History:  Past Surgical History  Procedure Date  . Vesicovaginal fistula closure w/ tah   . Av fistula placement, brachiocephalic [id number]    left arm   by Dr. Bridgett Larsson  . Tee without cardioversion 2011    2/2 Strep Bovis infection    PT Assessment/Plan/Recommendation PT Assessment Clinical Impression Statement: 77 y.o. female admitted to [facility] with fall, weakness and lethargy.  She presents with decreased strength, decreased mobility, decreased balance, decreased activity tolerance and endurance.  She would benefit from acute PT to maximize her independence, functional mobility and safety so that after extensive rehab (possibly SNF level) she could return home safely and avoid  falling.   PT Recommendation/Assessment: Patient will need skilled PT in the acute care venue PT Problem List: Decreased strength;Decreased activity tolerance;Decreased balance;Decreased mobility PT Therapy Diagnosis : Difficulty walking;Abnormality of gait;Generalized weakness PT Plan PT Frequency: Min 3X/week PT Treatment/Interventions: DME instruction;Gait training;Functional mobility training;Therapeutic activities;Therapeutic exercise;Balance training;Neuromuscular re-education;Cognitive remediation;Patient/family education PT Recommendation Follow Up Recommendations: Skilled nursing facility;24 hour supervision/assistance Equipment Recommended: Defer to next venue PT Goals  Acute Rehab PT Goals PT Goal Formulation: With patient/family Time For Goal Achievement: 2 weeks Pt will go Supine/Side to Sit: with modified independence;with HOB 0 degrees PT Goal: Supine/Side to Sit - Progress: Not met Pt will go Sit to Supine/Side: with modified independence;with HOB 0 degrees PT Goal: Sit to Supine/Side - Progress: Not met Pt will go Sit to Stand: with supervision PT Goal: Sit to Stand - Progress: Not met Pt will go Stand to Sit: with supervision PT Goal: Stand to Sit - Progress: Not met Pt will Transfer Bed to Chair/Chair to Bed: with supervision PT Transfer Goal: Bed to Chair/Chair to Bed - Progress: Not met Pt will Ambulate: 16 - 50 feet;with min assist;with rolling walker PT Goal: Ambulate - Progress: Not met  PT Evaluation Precautions/Restrictions  Precautions Precautions: Fall Restrictions Weight Bearing Restrictions: No Prior Functioning  Home Living Lives With: Alone Receives Help From: Personal care attendant (7 days per week for 6 hours per day) Type of Home: Apartment Home Layout: One level  Home Adaptive Equipment: Quad cane;Walker - rolling Prior Function Level of Independence: Requires assistive device for independence;Needs assistance with ADLs;Needs assistance  with homemaking Cognition Cognition Orientation Level: Other (Comment) (repeats questions throughout treatment session) Cognition - Other Comments: did not know her daughter was in the room despite daughter talking with her a few minutes earlier.   Sensation/Coordination   Extremity Assessment RLE Assessment RLE Assessment: Exceptions to [facility] RLE Strength RLE Overall Strength: Deficits RLE Overall Strength Comments: groslly 3/5 per functional assessment LLE Assessment LLE Assessment: Exceptions to [facility] LLE Strength LLE Overall Strength: Deficits LLE Overall Strength Comments: grossly 3/5 per functional assessment.   Mobility (including Balance) Bed Mobility Bed Mobility: Yes Supine to Sit: 3: Mod assist;With rails;HOB elevated (Comment degrees) (HOB 30 degrees) Supine to Sit Details (indicate cue type and reason): mod assist to help support trunk to get to EOB, verbal and maual assist for patient to reach for bedrail.   Sitting - Scoot to Edge of Bed: 3: Mod assist;With rail Sitting - Scoot to [place] of Bed Details (indicate cue type and reason): mod assist to help weight shift hips to EOB.  Patient pulling on rail.  Transfers Transfers: Yes Sit to Stand: 3: Mod assist;From elevated surface;With upper extremity assist;From bed Sit to Stand Details (indicate cue type and reason): mod assist to support trunk during transition to stand and to stabilize RW to keep it from tipping.   Stand to Sit: 4: Min assist;With upper extremity assist;With armrests;To chair/3-in-1 Stand to Sit Details: min assist to help control descent to sit.   Ambulation/Gait Ambulation/Gait: Yes Ambulation/Gait Assistance: 4: Min assist Ambulation/Gait Assistance Details (indicate cue type and reason): min assist to help support trunk and maneuver RW during gait.  Verbal and manual cues for upright posture.   Ambulation Distance (Feet): 3 Feet Assistive device: Rolling walker Gait Pattern: Shuffle;Trunk flexed      Exercise    End of Session PT - End of Session Equipment Utilized During Treatment: Gait belt (RW) Activity Tolerance: Patient limited by fatigue Patient left: in chair;with call bell in reach;with family/visitor present (daughter) Nurse Communication:  (asked RN tech to change out sheets secondary to urine in bed) General Behavior During Session: [facility] for tasks performed Cognition: Impaired Cognitive Impairment: generally displaying STM deficits today  Estle Huguley B. Elissia Spiewak, PT, DPT  01/06/2011, 11:45 AM

## 2011-01-06 NOTE — Progress Notes (Signed)
Subjective: This lady is keen to go home. She describes that she was admitted because she could not stand up and walk. There is also a history of a fall 3-4 days ago. Also, on reviewing her primary care physician's notes, it appears that she does have a history of falls. She did have a stroke affecting her right side previously but this was many years ago.           Physical Exam: Blood pressure 148/72, pulse 59, temperature 99.9 F (37.7 C), temperature source Oral, resp. rate 18, height 5\' 5"  (1.651 m), weight 83 kg (182 lb 15.7 oz), SpO2 100.00%. She appears alert but just seems slightly disoriented to events. Heart sounds are present and normal without murmurs. Lung fields are clinically clear. There are no obvious new focal neurological signs.   Investigations:     Basic Metabolic Panel:  Basename 01/06/11 0540 01/05/11 1215  NA 138 135  K 4.6 4.5  CL 106 102  CO2 22 24  GLUCOSE 213* 243*  BUN 97* 101*  CREATININE 3.64* 3.57*  CALCIUM 8.9 9.1  MG -- --  PHOS -- --   Liver Function Tests:  [facility] 01/06/11 0540 01/05/11 1215  AST 7 9  ALT <5 5  ALKPHOS 51 58  BILITOT 0.3 0.3  PROT 5.8* 6.1  ALBUMIN 2.5* 2.7*     CBC:  Basename 01/06/11 0540 01/05/11 1215  WBC 13.1* 13.1*  NEUTROABS -- 11.2*  HGB 10.3* 10.8*  HCT 31.7* 33.0*  MCV 73.7* 73.3*  PLT 166 169    Dg Chest 1 View  01/05/2011  *RADIOLOGY REPORT*  Clinical Data: Fever, altered level of consciousness for several days  CHEST - 1 VIEW  Comparison: Portable chest x-ray of 12/27/2010  Findings: Moderate cardiomegaly remains with perhaps mild pulmonary vascular congestion.  Slight elevation of the left hemidiaphragm is stable.  No acute bony abnormality is seen.  IMPRESSION: Stable moderate cardiomegaly with question of mild pulmonary vascular congestion.  Original Report Authenticated By: Joretta Bachelor, M.D.   Dg Hip Complete Right  01/06/2011  *RADIOLOGY REPORT*  Clinical Data: Fall,  right-sided hip and ankle pain  RIGHT HIP - COMPLETE 2+ VIEW  Comparison: Plain films 06/15/2010  Findings: Hips are located.  Dedicated views of the right hip demonstrate no femoral neck fracture.  No pelvic fracture or sacral fracture.  Moderate volume stool in the ascending colon.  IMPRESSION: No evidence of pelvic fracture or left hip fracture.  Original Report Authenticated By: Suzy Bouchard, M.D.   Dg Ankle 2 Views Right  01/06/2011  *RADIOLOGY REPORT*  Clinical Data: Right-sided pain, ankle pain  RIGHT ANKLE - 2 VIEW  Comparison: None.  Findings: Ankle mortise intact.  The talar dome is normal.  No evidence of malleolar fracture.  Osteopenia.  IMPRESSION: No evidence of fracture on two views of the ankle.  Original Report Authenticated By: Suzy Bouchard, M.D.   Ct Head Wo Contrast  01/05/2011  *RADIOLOGY REPORT*  Clinical Data:  Of 2 days ago with bruising in the right side of the neck, altered mental status, weakness  CT HEAD WITHOUT CONTRAST CT CERVICAL SPINE WITHOUT CONTRAST  Technique:  Multidetector CT imaging of the head and cervical spine was performed following the standard protocol without intravenous contrast.  Multiplanar CT image reconstructions of the cervical spine were also generated.  Comparison:  CT brain scan of 04/30/2010  CT HEAD  Findings: The ventricular system remains prominent, as are the cortical sulci and cerebellar  folia, consistent with diffuse atrophy.  The septum is midline position mild small vessel ischemic changes present in the prep ventricle white matter.  No hemorrhage, mass lesion, or acute infarction is seen.  There is mucosal thickening in the left maxillary sinus but no air-fluid level is seen.  Some opacification of ethmoid air cells is present and there is a small amount of fluid within the left partition of the sphenoid sinus as well the opacification of the right frontal sinus is noted.  On bone window images no acute calvarial abnormality is seen.   IMPRESSION:  1.  Stable atrophy and small vessel disease.  No acute intracranial abnormality. 2.  Diffuse sinus disease.  CT CERVICAL SPINE  Findings: The cervical vertebrae are slightly straightened in alignment.  There is diffuse degenerative change with some loss of joint space and spurring.  However no acute cervical spine fracture is seen.  The odontoid process is intact.  No prevertebral soft tissue swelling is noted.  There are the thyroid gland is slightly prominent and inhomogeneous with possible low attenuation nodule on the left.  IMPRESSION: Straightened alignment with diffuse degenerative change in osteopenia.  No acute abnormality.  Original Report Authenticated By: Joretta Bachelor, M.D.   Ct Cervical Spine Wo Contrast  01/05/2011  *RADIOLOGY REPORT*  Clinical Data:  Of 2 days ago with bruising in the right side of the neck, altered mental status, weakness  CT HEAD WITHOUT CONTRAST CT CERVICAL SPINE WITHOUT CONTRAST  Technique:  Multidetector CT imaging of the head and cervical spine was performed following the standard protocol without intravenous contrast.  Multiplanar CT image reconstructions of the cervical spine were also generated.  Comparison:  CT brain scan of 04/30/2010  CT HEAD  Findings: The ventricular system remains prominent, as are the cortical sulci and cerebellar folia, consistent with diffuse atrophy.  The septum is midline position mild small vessel ischemic changes present in the prep ventricle white matter.  No hemorrhage, mass lesion, or acute infarction is seen.  There is mucosal thickening in the left maxillary sinus but no air-fluid level is seen.  Some opacification of ethmoid air cells is present and there is a small amount of fluid within the left partition of the sphenoid sinus as well the opacification of the right frontal sinus is noted.  On bone window images no acute calvarial abnormality is seen.  IMPRESSION:  1.  Stable atrophy and small vessel disease.  No acute  intracranial abnormality. 2.  Diffuse sinus disease.  CT CERVICAL SPINE  Findings: The cervical vertebrae are slightly straightened in alignment.  There is diffuse degenerative change with some loss of joint space and spurring.  However no acute cervical spine fracture is seen.  The odontoid process is intact.  No prevertebral soft tissue swelling is noted.  There are the thyroid gland is slightly prominent and inhomogeneous with possible low attenuation nodule on the left.  IMPRESSION: Straightened alignment with diffuse degenerative change in osteopenia.  No acute abnormality.  Original Report Authenticated By: Joretta Bachelor, M.D.   Dg Knee Complete 4 Views Right  01/05/2011  *RADIOLOGY REPORT*  Clinical Data: [place] several days ago with pain  RIGHT KNEE - COMPLETE 4+ VIEW  Comparison: MR right knee of 02/25/2010  Findings: Tricompartmental degenerative joint disease is again noted with loss of joint space, sclerosis and spurring involving the compartment.  There is also chondrocalcinosis present which can be seen with CPPD or osteoarthritis.  No acute fracture is seen.  A small right knee joint effusion is present.  IMPRESSION: 1.  Significant tricompartmental degenerative joint disease with small joint effusion.  No fracture. 2.  Chondrocalcinosis.  Original Report Authenticated By: Joretta Bachelor, M.D.      Medications: I have reviewed the patient's current medications.  Impression: 1. Falls, unsteady gait-unclear etiology. 2. Hypertension. 3. Chronic kidney disease. 4. Diabetes mellitus. 5. History of congestive heart failure, clinically compensated at the present time.     Plan: 1. Agree with MRI of the brain. 2. Disposition-I think that when she is medically stable, I agree with skilled nursing facility for rehabilitation.     LOS: 1 day   Doree Albee Pager [phone]  01/06/2011, 1:56 PM

## 2011-01-07 ENCOUNTER — Ambulatory Visit (HOSPITAL_COMMUNITY): Payer: Medicare Other

## 2011-01-07 LAB — CBC
HCT: 30.6 % — ABNORMAL LOW (ref 36.0–46.0)
Hemoglobin: 9.9 g/dL — ABNORMAL LOW (ref 12.0–15.0)
MCH: 23.8 pg — ABNORMAL LOW (ref 26.0–34.0)
MCHC: 32.4 g/dL (ref 30.0–36.0)
RDW: 16 % — ABNORMAL HIGH (ref 11.5–15.5)

## 2011-01-07 LAB — COMPREHENSIVE METABOLIC PANEL
BUN: 94 mg/dL — ABNORMAL HIGH (ref 6–23)
Calcium: 8.8 mg/dL (ref 8.4–10.5)
GFR calc Af Amer: 12 mL/min — ABNORMAL LOW (ref >90)
Glucose, Bld: 98 mg/dL (ref 70–99)
Total Protein: 5.8 g/dL — ABNORMAL LOW (ref 6.0–8.3)

## 2011-01-07 LAB — GLUCOSE, CAPILLARY
Glucose-Capillary: 103 mg/dL — ABNORMAL HIGH (ref 70–99)
Glucose-Capillary: 201 mg/dL — ABNORMAL HIGH (ref 70–99)
Glucose-Capillary: 180 mg/dL — ABNORMAL HIGH (ref 70–99)

## 2011-01-07 MED ORDER — POLYVINYL ALCOHOL 1.4 % OP SOLN
1.0000 [drp] | Freq: Two times a day (BID) | OPHTHALMIC | Status: DC
  Administered 2011-01-07 – 2011-01-13 (×13): 1 [drp] via OPHTHALMIC
  Filled 2011-01-07 (×2): qty 15

## 2011-01-07 NOTE — Progress Notes (Signed)
Attempted to work with pt for PT today.  She refuses, stating that she is too fatigued.  Will try again tomorrow.

## 2011-01-07 NOTE — Progress Notes (Signed)
Patient had a bowel movement earlier in the shift. Stool was black, and tarry looking. Doctor was notified. Order was given for a hemoccult stool. It was negative. Will continue to monitor

## 2011-01-07 NOTE — Progress Notes (Signed)
Subjective: This lady remains weak. Her renal function actually is deteriorating. She did have an MRI brain scan yesterday which was unremarkable as it did not show an acute stroke but did show subtle intraventricular hemorrhage reflecting her recent closed head injury. There was no midline shift.          Physical Exam: Blood pressure 157/66, pulse 61, temperature 98.5 F (36.9 C), temperature source Oral, resp. rate 20, height 5\' 5"  (1.651 m), weight 83 kg (182 lb 15.7 oz), SpO2 98.00%. She appears alert but just seems slightly disoriented to events. Heart sounds are present and normal without murmurs. Lung fields are clinically clear. There are no obvious new focal neurological signs.   Investigations:     Basic Metabolic Panel:  Basename 01/07/11 0524 01/06/11 0540  NA 137 138  K 4.3 4.6  CL 106 106  CO2 23 22  GLUCOSE 98 213*  BUN 94* 97*  CREATININE 3.71* 3.64*  CALCIUM 8.8 8.9  MG -- --  PHOS -- --   Liver Function Tests:  [facility] 01/07/11 0524 01/06/11 0540  AST 7 7  ALT <5 <5  ALKPHOS 50 51  BILITOT 0.3 0.3  PROT 5.8* 5.8*  ALBUMIN 2.3* 2.5*     CBC:  Basename 01/07/11 0524 01/06/11 0540 01/05/11 1215  WBC 11.7* 13.1* --  NEUTROABS -- -- 11.2*  HGB 9.9* 10.3* --  HCT 30.6* 31.7* --  MCV 73.6* 73.7* --  PLT 148* 166 --    Dg Hip Complete Right  01/06/2011  *RADIOLOGY REPORT*  Clinical Data: Fall, right-sided hip and ankle pain  RIGHT HIP - COMPLETE 2+ VIEW  Comparison: Plain films 06/15/2010  Findings: Hips are located.  Dedicated views of the right hip demonstrate no femoral neck fracture.  No pelvic fracture or sacral fracture.  Moderate volume stool in the ascending colon.  IMPRESSION: No evidence of pelvic fracture or left hip fracture.  Original Report Authenticated By: Suzy Bouchard, M.D.   Dg Ankle 2 Views Right  01/06/2011  *RADIOLOGY REPORT*  Clinical Data: Right-sided pain, ankle pain  RIGHT ANKLE - 2 VIEW  Comparison: None.   Findings: Ankle mortise intact.  The talar dome is normal.  No evidence of malleolar fracture.  Osteopenia.  IMPRESSION: No evidence of fracture on two views of the ankle.  Original Report Authenticated By: Suzy Bouchard, M.D.   Ct Head Wo Contrast  01/05/2011  *RADIOLOGY REPORT*  Clinical Data:  Of 2 days ago with bruising in the right side of the neck, altered mental status, weakness  CT HEAD WITHOUT CONTRAST CT CERVICAL SPINE WITHOUT CONTRAST  Technique:  Multidetector CT imaging of the head and cervical spine was performed following the standard protocol without intravenous contrast.  Multiplanar CT image reconstructions of the cervical spine were also generated.  Comparison:  CT brain scan of 04/30/2010  CT HEAD  Findings: The ventricular system remains prominent, as are the cortical sulci and cerebellar folia, consistent with diffuse atrophy.  The septum is midline position mild small vessel ischemic changes present in the prep ventricle white matter.  No hemorrhage, mass lesion, or acute infarction is seen.  There is mucosal thickening in the left maxillary sinus but no air-fluid level is seen.  Some opacification of ethmoid air cells is present and there is a small amount of fluid within the left partition of the sphenoid sinus as well the opacification of the right frontal sinus is noted.  On bone window images no acute calvarial abnormality is  seen.  IMPRESSION:  1.  Stable atrophy and small vessel disease.  No acute intracranial abnormality. 2.  Diffuse sinus disease.  CT CERVICAL SPINE  Findings: The cervical vertebrae are slightly straightened in alignment.  There is diffuse degenerative change with some loss of joint space and spurring.  However no acute cervical spine fracture is seen.  The odontoid process is intact.  No prevertebral soft tissue swelling is noted.  There are the thyroid gland is slightly prominent and inhomogeneous with possible low attenuation nodule on the left.  IMPRESSION:  Straightened alignment with diffuse degenerative change in osteopenia.  No acute abnormality.  Original Report Authenticated By: Joretta Bachelor, M.D.   Ct Cervical Spine Wo Contrast  01/05/2011  *RADIOLOGY REPORT*  Clinical Data:  Of 2 days ago with bruising in the right side of the neck, altered mental status, weakness  CT HEAD WITHOUT CONTRAST CT CERVICAL SPINE WITHOUT CONTRAST  Technique:  Multidetector CT imaging of the head and cervical spine was performed following the standard protocol without intravenous contrast.  Multiplanar CT image reconstructions of the cervical spine were also generated.  Comparison:  CT brain scan of 04/30/2010  CT HEAD  Findings: The ventricular system remains prominent, as are the cortical sulci and cerebellar folia, consistent with diffuse atrophy.  The septum is midline position mild small vessel ischemic changes present in the prep ventricle white matter.  No hemorrhage, mass lesion, or acute infarction is seen.  There is mucosal thickening in the left maxillary sinus but no air-fluid level is seen.  Some opacification of ethmoid air cells is present and there is a small amount of fluid within the left partition of the sphenoid sinus as well the opacification of the right frontal sinus is noted.  On bone window images no acute calvarial abnormality is seen.  IMPRESSION:  1.  Stable atrophy and small vessel disease.  No acute intracranial abnormality. 2.  Diffuse sinus disease.  CT CERVICAL SPINE  Findings: The cervical vertebrae are slightly straightened in alignment.  There is diffuse degenerative change with some loss of joint space and spurring.  However no acute cervical spine fracture is seen.  The odontoid process is intact.  No prevertebral soft tissue swelling is noted.  There are the thyroid gland is slightly prominent and inhomogeneous with possible low attenuation nodule on the left.  IMPRESSION: Straightened alignment with diffuse degenerative change in osteopenia.   No acute abnormality.  Original Report Authenticated By: Joretta Bachelor, M.D.   Mr Brain Wo Contrast  01/06/2011  *RADIOLOGY REPORT*  Clinical Data: Evaluated for possible stroke.  Altered mental status.  Recent fall  MRI HEAD WITHOUT CONTRAST  Technique:  Multiplanar, multiecho pulse sequences of the brain and surrounding structures were obtained according to standard protocol without intravenous contrast.  Comparison: CT head 01/05/2011.  Findings: The patient was moderately uncooperative and could not remain motionless for the study.  Images are suboptimal.  Small or subtle lesions could be overlooked.  Motion degraded diffusion weighted images show no acute stroke. There is advanced atrophy with chronic microvascular ischemic change. A small osteoma projects from the inner table of the calvarium in the right frontal bone.  There are small foci of intraventricular hemorrhage layering posteriorly on gradient sequence left greater than right (images 11 and 12 of series 10) reflecting previous closed head injury.  No parenchymal bleed is seen.  There is no midline shift.  Intracranial vasculature is grossly patent. Pituitary and cerebellar tonsils grossly unremarkable.  The foci of hemorrhage are better seen on MR than on CT.  IMPRESSION: No acute stroke.  Subtle interventricular hemorrhage reflecting previous closed head injury.  Atrophy and small vessel disease.  Original Report Authenticated By: Staci Righter, M.D.      Medications: I have reviewed the patient's current medications.  Impression: 1. Falls, unsteady gait-unclear etiology. I wonder whether she is getting hypovolemic with diuretics. 2. Hypertension. 3. Chronic kidney disease, worsening acutely. 4. Diabetes mellitus. 5. History of congestive heart failure, clinically compensated at the present time.     Plan: 1. Increase intravenous fluid rate 250 cc an hour. 2. Discontinue Lasix. 3. I have emphasized to the patient that she  will need skilled nursing facility upon discharge.     LOS: 2 days   Doree Albee Pager [phone]  01/07/2011, 10:55 AM

## 2011-01-08 LAB — GLUCOSE, CAPILLARY
Glucose-Capillary: 160 mg/dL — ABNORMAL HIGH (ref 70–99)
Glucose-Capillary: 178 mg/dL — ABNORMAL HIGH (ref 70–99)

## 2011-01-08 LAB — COMPREHENSIVE METABOLIC PANEL
Albumin: 2.4 g/dL — ABNORMAL LOW (ref 3.5–5.2)
BUN: 88 mg/dL — ABNORMAL HIGH (ref 6–23)
Calcium: 8.8 mg/dL (ref 8.4–10.5)
Creatinine, Ser: 3.35 mg/dL — ABNORMAL HIGH (ref 0.50–1.10)
Total Protein: 6 g/dL (ref 6.0–8.3)

## 2011-01-08 LAB — CBC
HCT: 30.6 % — ABNORMAL LOW (ref 36.0–46.0)
MCH: 23.7 pg — ABNORMAL LOW (ref 26.0–34.0)
MCHC: 32 g/dL (ref 30.0–36.0)
MCV: 73.9 fL — ABNORMAL LOW (ref 78.0–100.0)
RDW: 15.9 % — ABNORMAL HIGH (ref 11.5–15.5)

## 2011-01-08 LAB — URINE CULTURE
Colony Count: NO GROWTH
Culture: NO GROWTH

## 2011-01-08 MED ORDER — SODIUM CHLORIDE 0.9 % IJ SOLN
INTRAMUSCULAR | Status: AC
  Administered 2011-01-08: 12:00:00
  Filled 2011-01-08: qty 3

## 2011-01-08 MED ORDER — ACETAMINOPHEN 325 MG PO TABS
650.0000 mg | ORAL_TABLET | ORAL | Status: DC | PRN
  Administered 2011-01-08 – 2011-01-10 (×3): 650 mg via ORAL
  Filled 2011-01-08 (×4): qty 2

## 2011-01-08 NOTE — Progress Notes (Signed)
Physical Therapy Treatment Patient Details Name: [name] MRN: [id number] DOB: Feb 27, 1925 Today's Date: 01/08/2011  PT Assessment/Plan  PT - Assessment/Plan Comments on Treatment Session: pt poorly motivated to increase activity PT Plan: Discharge plan remains appropriate;Frequency remains appropriate PT Goals  Acute Rehab PT Goals PT Goal: Supine/Side to Sit - Progress: Progressing toward goal PT Goal: Sit to Supine/Side - Progress: Progressing toward goal PT Goal: Sit to Stand - Progress: Progressing toward goal PT Goal: Stand to Sit - Progress: Progressing toward goal PT Transfer Goal: Bed to Chair/Chair to Bed - Progress: Progressing toward goal PT Goal: Ambulate - Progress: Progressing toward goal  PT Treatment Precautions/Restrictions  Precautions Precautions: Fall Restrictions Weight Bearing Restrictions: No Mobility (including Balance) Bed Mobility Supine to Sit: 3: Mod assist Supine to Sit Details (indicate cue type and reason): instructed to reach with L UE for R sierail to assist to sitting Sitting - Scoot to Edge of Bed: 3: Mod assist Transfers Sit to Stand: 3: Mod assist;With upper extremity assist;From bed Stand to Sit: 4: Min assist;With upper extremity assist;With armrests Ambulation/Gait Ambulation/Gait: Yes Ambulation/Gait Assistance: 3: Mod assist Ambulation/Gait Assistance Details (indicate cue type and reason): pt is very reluctant to mobilize OOB...she could probably walk farther, but poorly motivated to do so Ambulation Distance (Feet): 4 Feet Assistive device: Rolling walker Gait Pattern: Shuffle;Trunk flexed Stairs: No Wheelchair Mobility Wheelchair Mobility: No    Exercise  General Exercises - Lower Extremity Ankle Circles/Pumps: AROM;Both;10 reps;Supine Short Arc Quad: AROM;Both;10 reps;Supine Heel Slides: AROM;Both;10 reps;Supine Hip ABduction/ADduction: AROM;Both;10 reps;Supine End of Session PT - End of Session Equipment Utilized  During Treatment: Gait belt Activity Tolerance: Patient tolerated treatment well Patient left: in chair;with call bell in reach;with bed alarm set Nurse Communication: Mobility status for transfers General Behavior During Session: [facility] for tasks performed Cognition: [facility] for tasks performed  [name] 01/08/2011, 2:52 PM

## 2011-01-08 NOTE — Progress Notes (Signed)
Subjective: This lady, today, for the first time, says she feels better and not weak.          Physical Exam: Blood pressure 149/63, pulse 66, temperature 98.7 F (37.1 C), temperature source Oral, resp. rate 20, height 5\' 5"  (1.651 m), weight 83 kg (182 lb 15.7 oz), SpO2 98.00%. She does appear more alert today. Heart sounds are present and normal without murmurs. Lung fields are clear. She is alert and orientated.   Investigations:     Basic Metabolic Panel:  Basename 01/08/11 0507 01/07/11 0524  NA 138 137  K 4.5 4.3  CL 108 106  CO2 22 23  GLUCOSE 177* 98  BUN 88* 94*  CREATININE 3.35* 3.71*  CALCIUM 8.8 8.8  MG -- --  PHOS -- --   Liver Function Tests:  [facility] 01/08/11 0507 01/07/11 0524  AST 7 7  ALT <5 <5  ALKPHOS 50 50  BILITOT 0.3 0.3  PROT 6.0 5.8*  ALBUMIN 2.4* 2.3*     CBC:  Basename 01/08/11 0507 01/07/11 0524 01/05/11 1215  WBC 11.3* 11.7* --  NEUTROABS -- -- 11.2*  HGB 9.8* 9.9* --  HCT 30.6* 30.6* --  MCV 73.9* 73.6* --  PLT 161 148* --    Dg Hip Complete Right  01/06/2011  *RADIOLOGY REPORT*  Clinical Data: Fall, right-sided hip and ankle pain  RIGHT HIP - COMPLETE 2+ VIEW  Comparison: Plain films 06/15/2010  Findings: Hips are located.  Dedicated views of the right hip demonstrate no femoral neck fracture.  No pelvic fracture or sacral fracture.  Moderate volume stool in the ascending colon.  IMPRESSION: No evidence of pelvic fracture or left hip fracture.  Original Report Authenticated By: Suzy Bouchard, M.D.   Dg Ankle 2 Views Right  01/06/2011  *RADIOLOGY REPORT*  Clinical Data: Right-sided pain, ankle pain  RIGHT ANKLE - 2 VIEW  Comparison: None.  Findings: Ankle mortise intact.  The talar dome is normal.  No evidence of malleolar fracture.  Osteopenia.  IMPRESSION: No evidence of fracture on two views of the ankle.  Original Report Authenticated By: Suzy Bouchard, M.D.   Ct Head Wo Contrast  01/05/2011  *RADIOLOGY  REPORT*  Clinical Data:  Of 2 days ago with bruising in the right side of the neck, altered mental status, weakness  CT HEAD WITHOUT CONTRAST CT CERVICAL SPINE WITHOUT CONTRAST  Technique:  Multidetector CT imaging of the head and cervical spine was performed following the standard protocol without intravenous contrast.  Multiplanar CT image reconstructions of the cervical spine were also generated.  Comparison:  CT brain scan of 04/30/2010  CT HEAD  Findings: The ventricular system remains prominent, as are the cortical sulci and cerebellar folia, consistent with diffuse atrophy.  The septum is midline position mild small vessel ischemic changes present in the prep ventricle white matter.  No hemorrhage, mass lesion, or acute infarction is seen.  There is mucosal thickening in the left maxillary sinus but no air-fluid level is seen.  Some opacification of ethmoid air cells is present and there is a small amount of fluid within the left partition of the sphenoid sinus as well the opacification of the right frontal sinus is noted.  On bone window images no acute calvarial abnormality is seen.  IMPRESSION:  1.  Stable atrophy and small vessel disease.  No acute intracranial abnormality. 2.  Diffuse sinus disease.  CT CERVICAL SPINE  Findings: The cervical vertebrae are slightly straightened in alignment.  There is diffuse  degenerative change with some loss of joint space and spurring.  However no acute cervical spine fracture is seen.  The odontoid process is intact.  No prevertebral soft tissue swelling is noted.  There are the thyroid gland is slightly prominent and inhomogeneous with possible low attenuation nodule on the left.  IMPRESSION: Straightened alignment with diffuse degenerative change in osteopenia.  No acute abnormality.  Original Report Authenticated By: Joretta Bachelor, M.D.   Ct Cervical Spine Wo Contrast  01/05/2011  *RADIOLOGY REPORT*  Clinical Data:  Of 2 days ago with bruising in the right side  of the neck, altered mental status, weakness  CT HEAD WITHOUT CONTRAST CT CERVICAL SPINE WITHOUT CONTRAST  Technique:  Multidetector CT imaging of the head and cervical spine was performed following the standard protocol without intravenous contrast.  Multiplanar CT image reconstructions of the cervical spine were also generated.  Comparison:  CT brain scan of 04/30/2010  CT HEAD  Findings: The ventricular system remains prominent, as are the cortical sulci and cerebellar folia, consistent with diffuse atrophy.  The septum is midline position mild small vessel ischemic changes present in the prep ventricle white matter.  No hemorrhage, mass lesion, or acute infarction is seen.  There is mucosal thickening in the left maxillary sinus but no air-fluid level is seen.  Some opacification of ethmoid air cells is present and there is a small amount of fluid within the left partition of the sphenoid sinus as well the opacification of the right frontal sinus is noted.  On bone window images no acute calvarial abnormality is seen.  IMPRESSION:  1.  Stable atrophy and small vessel disease.  No acute intracranial abnormality. 2.  Diffuse sinus disease.  CT CERVICAL SPINE  Findings: The cervical vertebrae are slightly straightened in alignment.  There is diffuse degenerative change with some loss of joint space and spurring.  However no acute cervical spine fracture is seen.  The odontoid process is intact.  No prevertebral soft tissue swelling is noted.  There are the thyroid gland is slightly prominent and inhomogeneous with possible low attenuation nodule on the left.  IMPRESSION: Straightened alignment with diffuse degenerative change in osteopenia.  No acute abnormality.  Original Report Authenticated By: Joretta Bachelor, M.D.   Mr Brain Wo Contrast  01/06/2011  *RADIOLOGY REPORT*  Clinical Data: Evaluated for possible stroke.  Altered mental status.  Recent fall  MRI HEAD WITHOUT CONTRAST  Technique:  Multiplanar,  multiecho pulse sequences of the brain and surrounding structures were obtained according to standard protocol without intravenous contrast.  Comparison: CT head 01/05/2011.  Findings: The patient was moderately uncooperative and could not remain motionless for the study.  Images are suboptimal.  Small or subtle lesions could be overlooked.  Motion degraded diffusion weighted images show no acute stroke. There is advanced atrophy with chronic microvascular ischemic change. A small osteoma projects from the inner table of the calvarium in the right frontal bone.  There are small foci of intraventricular hemorrhage layering posteriorly on gradient sequence left greater than right (images 11 and 12 of series 10) reflecting previous closed head injury.  No parenchymal bleed is seen.  There is no midline shift.  Intracranial vasculature is grossly patent. Pituitary and cerebellar tonsils grossly unremarkable.  The foci of hemorrhage are better seen on MR than on CT.  IMPRESSION: No acute stroke.  Subtle interventricular hemorrhage reflecting previous closed head injury.  Atrophy and small vessel disease.  Original Report Authenticated By: Madie Reno.  CURNES, M.D.      Medications: I have reviewed the patient's current medications.  Impression: 1. Falls, unsteady gait-unclear etiology, with recent subtle interventricular hemorrhage from closed head injury. 2. Hypertension. 3. Chronic kidney disease, some improvement today. 4. Diabetes mellitus. 5. History of congestive heart failure, clinically compensated at the present time.     Plan: 1. Decrease intravenous fluid rate to 100 cc an hour. 2. Encourage mobilization. 3. Disposition-skilled nursing facility when medically stable.     LOS: 3 days   Doree Albee Pager [phone]  01/08/2011, 9:40 AM

## 2011-01-08 NOTE — Plan of Care (Signed)
Problem: Phase III Progression Outcomes Goal: Voiding independently Outcome: Progressing Voiding in bedside commode,patient up without difficulty.

## 2011-01-09 ENCOUNTER — Inpatient Hospital Stay (HOSPITAL_COMMUNITY): Payer: Medicare HMO

## 2011-01-09 LAB — BASIC METABOLIC PANEL
BUN: 74 mg/dL — ABNORMAL HIGH (ref 6–23)
Chloride: 110 mEq/L (ref 96–112)
GFR calc Af Amer: 16 mL/min — ABNORMAL LOW (ref >90)
Glucose, Bld: 143 mg/dL — ABNORMAL HIGH (ref 70–99)
Potassium: 4.3 mEq/L (ref 3.5–5.1)
Sodium: 141 mEq/L (ref 135–145)

## 2011-01-09 LAB — GLUCOSE, CAPILLARY
Glucose-Capillary: 135 mg/dL — ABNORMAL HIGH (ref 70–99)
Glucose-Capillary: 208 mg/dL — ABNORMAL HIGH (ref 70–99)

## 2011-01-09 MED ORDER — FUROSEMIDE 10 MG/ML IJ SOLN: 40.0000 mg | Freq: Once | INTRAMUSCULAR | Status: DC

## 2011-01-09 MED ORDER — FUROSEMIDE 40 MG PO TABS
40.0000 mg | ORAL_TABLET | Freq: Every day | ORAL | Status: DC
  Administered 2011-01-10 – 2011-01-12 (×3): 40 mg via ORAL
  Filled 2011-01-09 (×3): qty 1

## 2011-01-09 MED ORDER — ALBUTEROL SULFATE (5 MG/ML) 0.5% IN NEBU
2.5000 mg | INHALATION_SOLUTION | RESPIRATORY_TRACT | Status: DC | PRN
  Administered 2011-01-09 – 2011-01-10 (×2): 2.5 mg via RESPIRATORY_TRACT
  Filled 2011-01-09 (×2): qty 0.5

## 2011-01-09 NOTE — Progress Notes (Signed)
Subjective: This lady does not feel as well today. She feels weak again. There are no specific complaints.         Physical Exam: Blood pressure 159/50, pulse 72, temperature 98.4 F (36.9 C), temperature source Oral, resp. rate 22, height 5\' 5"  (1.651 m), weight 83 kg (182 lb 15.7 oz), SpO2 99.00%. She does appear alert today. Heart sounds are present and normal without murmurs. Lung fields are clear. She is alert and orientated.   Investigations:     Basic Metabolic Panel:  Basename 01/09/11 0759 01/08/11 0507  NA 141 138  K 4.3 4.5  CL 110 108  CO2 21 22  GLUCOSE 143* 177*  BUN 74* 88*  CREATININE 2.98* 3.35*  CALCIUM 9.1 8.8  MG -- --  PHOS -- --   Liver Function Tests:  [facility] 01/08/11 0507 01/07/11 0524  AST 7 7  ALT <5 <5  ALKPHOS 50 50  BILITOT 0.3 0.3  PROT 6.0 5.8*  ALBUMIN 2.4* 2.3*     CBC:  Basename 01/08/11 0507 01/07/11 0524  WBC 11.3* 11.7*  NEUTROABS -- --  HGB 9.8* 9.9*  HCT 30.6* 30.6*  MCV 73.9* 73.6*  PLT 161 148*    Dg Hip Complete Right  01/06/2011  *RADIOLOGY REPORT*  Clinical Data: Fall, right-sided hip and ankle pain  RIGHT HIP - COMPLETE 2+ VIEW  Comparison: Plain films 06/15/2010  Findings: Hips are located.  Dedicated views of the right hip demonstrate no femoral neck fracture.  No pelvic fracture or sacral fracture.  Moderate volume stool in the ascending colon.  IMPRESSION: No evidence of pelvic fracture or left hip fracture.  Original Report Authenticated By: Suzy Bouchard, M.D.   Dg Ankle 2 Views Right  01/06/2011  *RADIOLOGY REPORT*  Clinical Data: Right-sided pain, ankle pain  RIGHT ANKLE - 2 VIEW  Comparison: None.  Findings: Ankle mortise intact.  The talar dome is normal.  No evidence of malleolar fracture.  Osteopenia.  IMPRESSION: No evidence of fracture on two views of the ankle.  Original Report Authenticated By: Suzy Bouchard, M.D.   Ct Head Wo Contrast  01/05/2011  *RADIOLOGY REPORT*  Clinical Data:   Of 2 days ago with bruising in the right side of the neck, altered mental status, weakness  CT HEAD WITHOUT CONTRAST CT CERVICAL SPINE WITHOUT CONTRAST  Technique:  Multidetector CT imaging of the head and cervical spine was performed following the standard protocol without intravenous contrast.  Multiplanar CT image reconstructions of the cervical spine were also generated.  Comparison:  CT brain scan of 04/30/2010  CT HEAD  Findings: The ventricular system remains prominent, as are the cortical sulci and cerebellar folia, consistent with diffuse atrophy.  The septum is midline position mild small vessel ischemic changes present in the prep ventricle white matter.  No hemorrhage, mass lesion, or acute infarction is seen.  There is mucosal thickening in the left maxillary sinus but no air-fluid level is seen.  Some opacification of ethmoid air cells is present and there is a small amount of fluid within the left partition of the sphenoid sinus as well the opacification of the right frontal sinus is noted.  On bone window images no acute calvarial abnormality is seen.  IMPRESSION:  1.  Stable atrophy and small vessel disease.  No acute intracranial abnormality. 2.  Diffuse sinus disease.  CT CERVICAL SPINE  Findings: The cervical vertebrae are slightly straightened in alignment.  There is diffuse degenerative change with some loss of joint  space and spurring.  However no acute cervical spine fracture is seen.  The odontoid process is intact.  No prevertebral soft tissue swelling is noted.  There are the thyroid gland is slightly prominent and inhomogeneous with possible low attenuation nodule on the left.  IMPRESSION: Straightened alignment with diffuse degenerative change in osteopenia.  No acute abnormality.  Original Report Authenticated By: Joretta Bachelor, M.D.   Ct Cervical Spine Wo Contrast  01/05/2011  *RADIOLOGY REPORT*  Clinical Data:  Of 2 days ago with bruising in the right side of the neck, altered  mental status, weakness  CT HEAD WITHOUT CONTRAST CT CERVICAL SPINE WITHOUT CONTRAST  Technique:  Multidetector CT imaging of the head and cervical spine was performed following the standard protocol without intravenous contrast.  Multiplanar CT image reconstructions of the cervical spine were also generated.  Comparison:  CT brain scan of 04/30/2010  CT HEAD  Findings: The ventricular system remains prominent, as are the cortical sulci and cerebellar folia, consistent with diffuse atrophy.  The septum is midline position mild small vessel ischemic changes present in the prep ventricle white matter.  No hemorrhage, mass lesion, or acute infarction is seen.  There is mucosal thickening in the left maxillary sinus but no air-fluid level is seen.  Some opacification of ethmoid air cells is present and there is a small amount of fluid within the left partition of the sphenoid sinus as well the opacification of the right frontal sinus is noted.  On bone window images no acute calvarial abnormality is seen.  IMPRESSION:  1.  Stable atrophy and small vessel disease.  No acute intracranial abnormality. 2.  Diffuse sinus disease.  CT CERVICAL SPINE  Findings: The cervical vertebrae are slightly straightened in alignment.  There is diffuse degenerative change with some loss of joint space and spurring.  However no acute cervical spine fracture is seen.  The odontoid process is intact.  No prevertebral soft tissue swelling is noted.  There are the thyroid gland is slightly prominent and inhomogeneous with possible low attenuation nodule on the left.  IMPRESSION: Straightened alignment with diffuse degenerative change in osteopenia.  No acute abnormality.  Original Report Authenticated By: Joretta Bachelor, M.D.   Mr Brain Wo Contrast  01/06/2011  *RADIOLOGY REPORT*  Clinical Data: Evaluated for possible stroke.  Altered mental status.  Recent fall  MRI HEAD WITHOUT CONTRAST  Technique:  Multiplanar, multiecho pulse sequences  of the brain and surrounding structures were obtained according to standard protocol without intravenous contrast.  Comparison: CT head 01/05/2011.  Findings: The patient was moderately uncooperative and could not remain motionless for the study.  Images are suboptimal.  Small or subtle lesions could be overlooked.  Motion degraded diffusion weighted images show no acute stroke. There is advanced atrophy with chronic microvascular ischemic change. A small osteoma projects from the inner table of the calvarium in the right frontal bone.  There are small foci of intraventricular hemorrhage layering posteriorly on gradient sequence left greater than right (images 11 and 12 of series 10) reflecting previous closed head injury.  No parenchymal bleed is seen.  There is no midline shift.  Intracranial vasculature is grossly patent. Pituitary and cerebellar tonsils grossly unremarkable.  The foci of hemorrhage are better seen on MR than on CT.  IMPRESSION: No acute stroke.  Subtle interventricular hemorrhage reflecting previous closed head injury.  Atrophy and small vessel disease.  Original Report Authenticated By: Staci Righter, M.D.  Medications: I have reviewed the patient's current medications.  Impression: 1. Falls, unsteady gait-unclear etiology, with recent subtle interventricular hemorrhage from closed head injury. 2. Hypertension. 3. Chronic kidney disease, further improvement today. 4. Diabetes mellitus. 5. History of congestive heart failure, clinically compensated at the present time.     Plan: 1. Decrease intravenous fluid rate to KVO. I think her present creatinine is likely to be at baseline now.. 2. Encourage mobilization. 3. Disposition-skilled nursing facility when medically stable.     LOS: 4 days   Doree Albee Pager [phone]  01/09/2011, 11:06 AM

## 2011-01-10 LAB — GLUCOSE, CAPILLARY
Glucose-Capillary: 119 mg/dL — ABNORMAL HIGH (ref 70–99)
Glucose-Capillary: 113 mg/dL — ABNORMAL HIGH (ref 70–99)

## 2011-01-10 LAB — BASIC METABOLIC PANEL
BUN: 71 mg/dL — ABNORMAL HIGH (ref 6–23)
CO2: 22 mEq/L (ref 19–32)
GFR calc non Af Amer: 15 mL/min — ABNORMAL LOW (ref >90)
Glucose, Bld: 116 mg/dL — ABNORMAL HIGH (ref 70–99)
Potassium: 4.4 mEq/L (ref 3.5–5.1)

## 2011-01-10 MED ORDER — CARVEDILOL 3.125 MG PO TABS: 6.2500 mg | ORAL_TABLET | Freq: Two times a day (BID) | ORAL | Status: DC

## 2011-01-10 MED ORDER — ALBUTEROL SULFATE (5 MG/ML) 0.5% IN NEBU: 2.5000 mg | INHALATION_SOLUTION | RESPIRATORY_TRACT | Status: DC | PRN

## 2011-01-10 MED ORDER — SODIUM CHLORIDE 0.9 % IN NEBU
INHALATION_SOLUTION | RESPIRATORY_TRACT | Status: AC
  Administered 2011-01-10: 3 mL
  Filled 2011-01-10: qty 3

## 2011-01-10 MED ORDER — ALBUTEROL SULFATE (5 MG/ML) 0.5% IN NEBU
2.5000 mg | INHALATION_SOLUTION | Freq: Four times a day (QID) | RESPIRATORY_TRACT | Status: DC
  Administered 2011-01-10 – 2011-01-13 (×13): 2.5 mg via RESPIRATORY_TRACT
  Filled 2011-01-10 (×14): qty 0.5

## 2011-01-10 MED ORDER — ISOSORB DINITRATE-HYDRALAZINE 20-37.5 MG PO TABS
1.0000 | ORAL_TABLET | Freq: Two times a day (BID) | ORAL | Status: DC
  Administered 2011-01-10: 1 via ORAL
  Filled 2011-01-10 (×4): qty 1

## 2011-01-10 MED ORDER — CARVEDILOL 12.5 MG PO TABS
12.5000 mg | ORAL_TABLET | Freq: Two times a day (BID) | ORAL | Status: DC
  Administered 2011-01-10: 12.5 mg via ORAL
  Filled 2011-01-10: qty 1

## 2011-01-10 NOTE — Progress Notes (Signed)
Subjective: This lady continues not to feel well. She complains of a headache. She is on nitrates. She has had a small intraventricular hemorrhage. However she has remained alert and orientated.         Physical Exam: Blood pressure 182/72, pulse 75, temperature 98.2 F (36.8 C), temperature source Oral, resp. rate 18, height 5\' 5"  (1.651 m), weight 83 kg (182 lb 15.7 oz), SpO2 97.00%. She does appear alert today. Heart sounds are present and normal without murmurs. Lung fields are clear. She is alert and orientated.   Investigations:     Basic Metabolic Panel:  Basename 01/10/11 0413 01/09/11 0759  NA 141 141  K 4.4 4.3  CL 112 110  CO2 22 21  GLUCOSE 116* 143*  BUN 71* 74*  CREATININE 2.79* 2.98*  CALCIUM 9.3 9.1  MG -- --  PHOS -- --   Liver Function Tests:  Basename 01/08/11 0507  AST 7  ALT <5  ALKPHOS 50  BILITOT 0.3  PROT 6.0  ALBUMIN 2.4*     CBC:  Basename 01/08/11 0507  WBC 11.3*  NEUTROABS --  HGB 9.8*  HCT 30.6*  MCV 73.9*  PLT 161    Dg Hip Complete Right  01/06/2011  *RADIOLOGY REPORT*  Clinical Data: Fall, right-sided hip and ankle pain  RIGHT HIP - COMPLETE 2+ VIEW  Comparison: Plain films 06/15/2010  Findings: Hips are located.  Dedicated views of the right hip demonstrate no femoral neck fracture.  No pelvic fracture or sacral fracture.  Moderate volume stool in the ascending colon.  IMPRESSION: No evidence of pelvic fracture or left hip fracture.  Original Report Authenticated By: Suzy Bouchard, M.D.   Dg Ankle 2 Views Right  01/06/2011  *RADIOLOGY REPORT*  Clinical Data: Right-sided pain, ankle pain  RIGHT ANKLE - 2 VIEW  Comparison: None.  Findings: Ankle mortise intact.  The talar dome is normal.  No evidence of malleolar fracture.  Osteopenia.  IMPRESSION: No evidence of fracture on two views of the ankle.  Original Report Authenticated By: Suzy Bouchard, M.D.   Ct Head Wo Contrast  01/05/2011  *RADIOLOGY REPORT*  Clinical  Data:  Of 2 days ago with bruising in the right side of the neck, altered mental status, weakness  CT HEAD WITHOUT CONTRAST CT CERVICAL SPINE WITHOUT CONTRAST  Technique:  Multidetector CT imaging of the head and cervical spine was performed following the standard protocol without intravenous contrast.  Multiplanar CT image reconstructions of the cervical spine were also generated.  Comparison:  CT brain scan of 04/30/2010  CT HEAD  Findings: The ventricular system remains prominent, as are the cortical sulci and cerebellar folia, consistent with diffuse atrophy.  The septum is midline position mild small vessel ischemic changes present in the prep ventricle white matter.  No hemorrhage, mass lesion, or acute infarction is seen.  There is mucosal thickening in the left maxillary sinus but no air-fluid level is seen.  Some opacification of ethmoid air cells is present and there is a small amount of fluid within the left partition of the sphenoid sinus as well the opacification of the right frontal sinus is noted.  On bone window images no acute calvarial abnormality is seen.  IMPRESSION:  1.  Stable atrophy and small vessel disease.  No acute intracranial abnormality. 2.  Diffuse sinus disease.  CT CERVICAL SPINE  Findings: The cervical vertebrae are slightly straightened in alignment.  There is diffuse degenerative change with some loss of joint space and spurring.  However no acute cervical spine fracture is seen.  The odontoid process is intact.  No prevertebral soft tissue swelling is noted.  There are the thyroid gland is slightly prominent and inhomogeneous with possible low attenuation nodule on the left.  IMPRESSION: Straightened alignment with diffuse degenerative change in osteopenia.  No acute abnormality.  Original Report Authenticated By: Joretta Bachelor, M.D.   Ct Cervical Spine Wo Contrast  01/05/2011  *RADIOLOGY REPORT*  Clinical Data:  Of 2 days ago with bruising in the right side of the neck,  altered mental status, weakness  CT HEAD WITHOUT CONTRAST CT CERVICAL SPINE WITHOUT CONTRAST  Technique:  Multidetector CT imaging of the head and cervical spine was performed following the standard protocol without intravenous contrast.  Multiplanar CT image reconstructions of the cervical spine were also generated.  Comparison:  CT brain scan of 04/30/2010  CT HEAD  Findings: The ventricular system remains prominent, as are the cortical sulci and cerebellar folia, consistent with diffuse atrophy.  The septum is midline position mild small vessel ischemic changes present in the prep ventricle white matter.  No hemorrhage, mass lesion, or acute infarction is seen.  There is mucosal thickening in the left maxillary sinus but no air-fluid level is seen.  Some opacification of ethmoid air cells is present and there is a small amount of fluid within the left partition of the sphenoid sinus as well the opacification of the right frontal sinus is noted.  On bone window images no acute calvarial abnormality is seen.  IMPRESSION:  1.  Stable atrophy and small vessel disease.  No acute intracranial abnormality. 2.  Diffuse sinus disease.  CT CERVICAL SPINE  Findings: The cervical vertebrae are slightly straightened in alignment.  There is diffuse degenerative change with some loss of joint space and spurring.  However no acute cervical spine fracture is seen.  The odontoid process is intact.  No prevertebral soft tissue swelling is noted.  There are the thyroid gland is slightly prominent and inhomogeneous with possible low attenuation nodule on the left.  IMPRESSION: Straightened alignment with diffuse degenerative change in osteopenia.  No acute abnormality.  Original Report Authenticated By: Joretta Bachelor, M.D.   Mr Brain Wo Contrast  01/06/2011  *RADIOLOGY REPORT*  Clinical Data: Evaluated for possible stroke.  Altered mental status.  Recent fall  MRI HEAD WITHOUT CONTRAST  Technique:  Multiplanar, multiecho pulse  sequences of the brain and surrounding structures were obtained according to standard protocol without intravenous contrast.  Comparison: CT head 01/05/2011.  Findings: The patient was moderately uncooperative and could not remain motionless for the study.  Images are suboptimal.  Small or subtle lesions could be overlooked.  Motion degraded diffusion weighted images show no acute stroke. There is advanced atrophy with chronic microvascular ischemic change. A small osteoma projects from the inner table of the calvarium in the right frontal bone.  There are small foci of intraventricular hemorrhage layering posteriorly on gradient sequence left greater than right (images 11 and 12 of series 10) reflecting previous closed head injury.  No parenchymal bleed is seen.  There is no midline shift.  Intracranial vasculature is grossly patent. Pituitary and cerebellar tonsils grossly unremarkable.  The foci of hemorrhage are better seen on MR than on CT.  IMPRESSION: No acute stroke.  Subtle interventricular hemorrhage reflecting previous closed head injury.  Atrophy and small vessel disease.  Original Report Authenticated By: Staci Righter, M.D.      Medications: I have reviewed  the patient's current medications.  Impression: 1. Falls, unsteady gait-unclear etiology, with recent subtle interventricular hemorrhage from closed head injury. 2. Hypertension, uncontrolled. 3. Chronic kidney disease, further improvement today. 4. Diabetes mellitus. 5. History of congestive heart failure, clinically compensated at the present time. 6. Headaches, possibly related to nitrates.     Plan: 1. Discontinue IV fluids altogether. 2. Reduce the amount of nitrates given. 3. Start Coreg for better blood pressure control.     LOS: 5 days   Doree Albee Pager [phone]  01/10/2011, 11:22 AM

## 2011-01-11 ENCOUNTER — Inpatient Hospital Stay (HOSPITAL_COMMUNITY): Payer: Medicare HMO

## 2011-01-11 ENCOUNTER — Encounter (HOSPITAL_COMMUNITY): Payer: Self-pay | Admitting: Radiology

## 2011-01-11 LAB — BASIC METABOLIC PANEL
CO2: 22 mEq/L (ref 19–32)
Calcium: 9.3 mg/dL (ref 8.4–10.5)
Chloride: 114 mEq/L — ABNORMAL HIGH (ref 96–112)
Creatinine, Ser: 2.68 mg/dL — ABNORMAL HIGH (ref 0.50–1.10)
GFR calc Af Amer: 18 mL/min — ABNORMAL LOW (ref >90)
Sodium: 143 mEq/L (ref 135–145)

## 2011-01-11 MED ORDER — HYDRALAZINE HCL 25 MG PO TABS
50.0000 mg | ORAL_TABLET | Freq: Three times a day (TID) | ORAL | Status: DC
  Administered 2011-01-11 (×2): 50 mg via ORAL
  Filled 2011-01-11 (×2): qty 2

## 2011-01-11 MED ORDER — ISOSORB DINITRATE-HYDRALAZINE 20-37.5 MG PO TABS
1.0000 | ORAL_TABLET | Freq: Three times a day (TID) | ORAL | Status: DC
  Filled 2011-01-11: qty 1

## 2011-01-11 MED ORDER — CARVEDILOL 12.5 MG PO TABS
25.0000 mg | ORAL_TABLET | Freq: Two times a day (BID) | ORAL | Status: DC
  Administered 2011-01-11 – 2011-01-13 (×4): 25 mg via ORAL
  Filled 2011-01-11 (×4): qty 2

## 2011-01-11 NOTE — Progress Notes (Signed)
CSW faxed PT notes to SNF and awaiting bed offers and insurance authorization.  MD aware.  Salome Arnt

## 2011-01-11 NOTE — Progress Notes (Signed)
Subjective: This lady continues to have a headache. She does not have any other specific complaints. She is not mobilizing well.        Physical Exam: Blood pressure 189/66, pulse 69, temperature 98.1 F (36.7 C), temperature source Oral, resp. rate 16, height 5\' 5"  (1.651 m), weight 83 kg (182 lb 15.7 oz), SpO2 98.00%. She does appear alert today. Heart sounds are present and normal without murmurs. Lung fields are clear. She is alert and orientated.   Investigations:     Basic Metabolic Panel:  Basename 01/11/11 0538 01/10/11 0413  NA 143 141  K 4.5 4.4  CL 114* 112  CO2 22 22  GLUCOSE 90 116*  BUN 65* 71*  CREATININE 2.68* 2.79*  CALCIUM 9.3 9.3  MG -- --  PHOS -- --          Dg Hip Complete Right  01/06/2011  *RADIOLOGY REPORT*  Clinical Data: Fall, right-sided hip and ankle pain  RIGHT HIP - COMPLETE 2+ VIEW  Comparison: Plain films 06/15/2010  Findings: Hips are located.  Dedicated views of the right hip demonstrate no femoral neck fracture.  No pelvic fracture or sacral fracture.  Moderate volume stool in the ascending colon.  IMPRESSION: No evidence of pelvic fracture or left hip fracture.  Original Report Authenticated By: Suzy Bouchard, M.D.   Dg Ankle 2 Views Right  01/06/2011  *RADIOLOGY REPORT*  Clinical Data: Right-sided pain, ankle pain  RIGHT ANKLE - 2 VIEW  Comparison: None.  Findings: Ankle mortise intact.  The talar dome is normal.  No evidence of malleolar fracture.  Osteopenia.  IMPRESSION: No evidence of fracture on two views of the ankle.  Original Report Authenticated By: Suzy Bouchard, M.D.   Ct Head Wo Contrast  01/05/2011  *RADIOLOGY REPORT*  Clinical Data:  Of 2 days ago with bruising in the right side of the neck, altered mental status, weakness  CT HEAD WITHOUT CONTRAST CT CERVICAL SPINE WITHOUT CONTRAST  Technique:  Multidetector CT imaging of the head and cervical spine was performed following the standard protocol without  intravenous contrast.  Multiplanar CT image reconstructions of the cervical spine were also generated.  Comparison:  CT brain scan of 04/30/2010  CT HEAD  Findings: The ventricular system remains prominent, as are the cortical sulci and cerebellar folia, consistent with diffuse atrophy.  The septum is midline position mild small vessel ischemic changes present in the prep ventricle white matter.  No hemorrhage, mass lesion, or acute infarction is seen.  There is mucosal thickening in the left maxillary sinus but no air-fluid level is seen.  Some opacification of ethmoid air cells is present and there is a small amount of fluid within the left partition of the sphenoid sinus as well the opacification of the right frontal sinus is noted.  On bone window images no acute calvarial abnormality is seen.  IMPRESSION:  1.  Stable atrophy and small vessel disease.  No acute intracranial abnormality. 2.  Diffuse sinus disease.  CT CERVICAL SPINE  Findings: The cervical vertebrae are slightly straightened in alignment.  There is diffuse degenerative change with some loss of joint space and spurring.  However no acute cervical spine fracture is seen.  The odontoid process is intact.  No prevertebral soft tissue swelling is noted.  There are the thyroid gland is slightly prominent and inhomogeneous with possible low attenuation nodule on the left.  IMPRESSION: Straightened alignment with diffuse degenerative change in osteopenia.  No acute abnormality.  Original Report  Authenticated By: Joretta Bachelor, M.D.   Ct Cervical Spine Wo Contrast  01/05/2011  *RADIOLOGY REPORT*  Clinical Data:  Of 2 days ago with bruising in the right side of the neck, altered mental status, weakness  CT HEAD WITHOUT CONTRAST CT CERVICAL SPINE WITHOUT CONTRAST  Technique:  Multidetector CT imaging of the head and cervical spine was performed following the standard protocol without intravenous contrast.  Multiplanar CT image reconstructions of the  cervical spine were also generated.  Comparison:  CT brain scan of 04/30/2010  CT HEAD  Findings: The ventricular system remains prominent, as are the cortical sulci and cerebellar folia, consistent with diffuse atrophy.  The septum is midline position mild small vessel ischemic changes present in the prep ventricle white matter.  No hemorrhage, mass lesion, or acute infarction is seen.  There is mucosal thickening in the left maxillary sinus but no air-fluid level is seen.  Some opacification of ethmoid air cells is present and there is a small amount of fluid within the left partition of the sphenoid sinus as well the opacification of the right frontal sinus is noted.  On bone window images no acute calvarial abnormality is seen.  IMPRESSION:  1.  Stable atrophy and small vessel disease.  No acute intracranial abnormality. 2.  Diffuse sinus disease.  CT CERVICAL SPINE  Findings: The cervical vertebrae are slightly straightened in alignment.  There is diffuse degenerative change with some loss of joint space and spurring.  However no acute cervical spine fracture is seen.  The odontoid process is intact.  No prevertebral soft tissue swelling is noted.  There are the thyroid gland is slightly prominent and inhomogeneous with possible low attenuation nodule on the left.  IMPRESSION: Straightened alignment with diffuse degenerative change in osteopenia.  No acute abnormality.  Original Report Authenticated By: Joretta Bachelor, M.D.   Mr Brain Wo Contrast  01/06/2011  *RADIOLOGY REPORT*  Clinical Data: Evaluated for possible stroke.  Altered mental status.  Recent fall  MRI HEAD WITHOUT CONTRAST  Technique:  Multiplanar, multiecho pulse sequences of the brain and surrounding structures were obtained according to standard protocol without intravenous contrast.  Comparison: CT head 01/05/2011.  Findings: The patient was moderately uncooperative and could not remain motionless for the study.  Images are suboptimal.   Small or subtle lesions could be overlooked.  Motion degraded diffusion weighted images show no acute stroke. There is advanced atrophy with chronic microvascular ischemic change. A small osteoma projects from the inner table of the calvarium in the right frontal bone.  There are small foci of intraventricular hemorrhage layering posteriorly on gradient sequence left greater than right (images 11 and 12 of series 10) reflecting previous closed head injury.  No parenchymal bleed is seen.  There is no midline shift.  Intracranial vasculature is grossly patent. Pituitary and cerebellar tonsils grossly unremarkable.  The foci of hemorrhage are better seen on MR than on CT.  IMPRESSION: No acute stroke.  Subtle interventricular hemorrhage reflecting previous closed head injury.  Atrophy and small vessel disease.  Original Report Authenticated By: Staci Righter, M.D.      Medications: I have reviewed the patient's current medications.  Impression: 1. Falls, unsteady gait-unclear etiology, with recent subtle interventricular hemorrhage from closed head injury. 2. Hypertension, uncontrolled. 3. Chronic kidney disease, further improvement today. 4. Diabetes mellitus. 5. History of congestive heart failure, clinically compensated at the present time. 6. Headaches, possibly related to nitrates.     Plan: 1. Discontinue  nitrates. 2. Repeat CT head scan to make sure there is no further bleed.  3. Increase Coreg.     LOS: 6 days   Doree Albee Pager [phone]  01/11/2011, 11:10 AM

## 2011-01-12 ENCOUNTER — Inpatient Hospital Stay (HOSPITAL_COMMUNITY): Payer: Medicare HMO

## 2011-01-12 LAB — GLUCOSE, CAPILLARY
Glucose-Capillary: 87 mg/dL (ref 70–99)
Glucose-Capillary: 195 mg/dL — ABNORMAL HIGH (ref 70–99)

## 2011-01-12 MED ORDER — SODIUM CHLORIDE 0.9 % IN NEBU
INHALATION_SOLUTION | RESPIRATORY_TRACT | Status: AC
  Administered 2011-01-12: 3 mL
  Filled 2011-01-12: qty 3

## 2011-01-12 MED ORDER — HYDRALAZINE HCL 25 MG PO TABS
100.0000 mg | ORAL_TABLET | Freq: Three times a day (TID) | ORAL | Status: DC
  Administered 2011-01-12 – 2011-01-13 (×4): 100 mg via ORAL
  Filled 2011-01-12 (×4): qty 4

## 2011-01-12 MED ORDER — FUROSEMIDE 40 MG PO TABS
40.0000 mg | ORAL_TABLET | Freq: Two times a day (BID) | ORAL | Status: DC
  Administered 2011-01-12 – 2011-01-13 (×2): 40 mg via ORAL
  Filled 2011-01-12 (×2): qty 1

## 2011-01-12 NOTE — Progress Notes (Signed)
Subjective: This lady is rather frustrated that she has not been improved. She says that her headaches are now intermittent. Her nitrates have been discontinued. Her blood pressure, unfortunately, it remains elevated. A repeat CT head scan did not show any significant intracranial bleeding.        Physical Exam: Blood pressure 198/78, pulse 65, temperature 97.9 F (36.6 C), temperature source Oral, resp. rate 20, height 5\' 5"  (1.651 m), weight 83 kg (182 lb 15.7 oz), SpO2 99.00%. She does appear alert today. Heart sounds are present and normal without murmurs. Lung fields are clear. She is alert and orientated.   Investigations:     Basic Metabolic Panel:  Basename 01/11/11 0538 01/10/11 0413  NA 143 141  K 4.5 4.4  CL 114* 112  CO2 22 22  GLUCOSE 90 116*  BUN 65* 71*  CREATININE 2.68* 2.79*  CALCIUM 9.3 9.3  MG -- --  PHOS -- --          Dg Hip Complete Right  01/06/2011  *RADIOLOGY REPORT*  Clinical Data: Fall, right-sided hip and ankle pain  RIGHT HIP - COMPLETE 2+ VIEW  Comparison: Plain films 06/15/2010  Findings: Hips are located.  Dedicated views of the right hip demonstrate no femoral neck fracture.  No pelvic fracture or sacral fracture.  Moderate volume stool in the ascending colon.  IMPRESSION: No evidence of pelvic fracture or left hip fracture.  Original Report Authenticated By: Suzy Bouchard, M.D.   Dg Ankle 2 Views Right  01/06/2011  *RADIOLOGY REPORT*  Clinical Data: Right-sided pain, ankle pain  RIGHT ANKLE - 2 VIEW  Comparison: None.  Findings: Ankle mortise intact.  The talar dome is normal.  No evidence of malleolar fracture.  Osteopenia.  IMPRESSION: No evidence of fracture on two views of the ankle.  Original Report Authenticated By: Suzy Bouchard, M.D.   Ct Head Wo Contrast  01/05/2011  *RADIOLOGY REPORT*  Clinical Data:  Of 2 days ago with bruising in the right side of the neck, altered mental status, weakness  CT HEAD WITHOUT CONTRAST CT  CERVICAL SPINE WITHOUT CONTRAST  Technique:  Multidetector CT imaging of the head and cervical spine was performed following the standard protocol without intravenous contrast.  Multiplanar CT image reconstructions of the cervical spine were also generated.  Comparison:  CT brain scan of 04/30/2010  CT HEAD  Findings: The ventricular system remains prominent, as are the cortical sulci and cerebellar folia, consistent with diffuse atrophy.  The septum is midline position mild small vessel ischemic changes present in the prep ventricle white matter.  No hemorrhage, mass lesion, or acute infarction is seen.  There is mucosal thickening in the left maxillary sinus but no air-fluid level is seen.  Some opacification of ethmoid air cells is present and there is a small amount of fluid within the left partition of the sphenoid sinus as well the opacification of the right frontal sinus is noted.  On bone window images no acute calvarial abnormality is seen.  IMPRESSION:  1.  Stable atrophy and small vessel disease.  No acute intracranial abnormality. 2.  Diffuse sinus disease.  CT CERVICAL SPINE  Findings: The cervical vertebrae are slightly straightened in alignment.  There is diffuse degenerative change with some loss of joint space and spurring.  However no acute cervical spine fracture is seen.  The odontoid process is intact.  No prevertebral soft tissue swelling is noted.  There are the thyroid gland is slightly prominent and inhomogeneous with possible  low attenuation nodule on the left.  IMPRESSION: Straightened alignment with diffuse degenerative change in osteopenia.  No acute abnormality.  Original Report Authenticated By: Joretta Bachelor, M.D.   Ct Cervical Spine Wo Contrast  01/05/2011  *RADIOLOGY REPORT*  Clinical Data:  Of 2 days ago with bruising in the right side of the neck, altered mental status, weakness  CT HEAD WITHOUT CONTRAST CT CERVICAL SPINE WITHOUT CONTRAST  Technique:  Multidetector CT imaging  of the head and cervical spine was performed following the standard protocol without intravenous contrast.  Multiplanar CT image reconstructions of the cervical spine were also generated.  Comparison:  CT brain scan of 04/30/2010  CT HEAD  Findings: The ventricular system remains prominent, as are the cortical sulci and cerebellar folia, consistent with diffuse atrophy.  The septum is midline position mild small vessel ischemic changes present in the prep ventricle white matter.  No hemorrhage, mass lesion, or acute infarction is seen.  There is mucosal thickening in the left maxillary sinus but no air-fluid level is seen.  Some opacification of ethmoid air cells is present and there is a small amount of fluid within the left partition of the sphenoid sinus as well the opacification of the right frontal sinus is noted.  On bone window images no acute calvarial abnormality is seen.  IMPRESSION:  1.  Stable atrophy and small vessel disease.  No acute intracranial abnormality. 2.  Diffuse sinus disease.  CT CERVICAL SPINE  Findings: The cervical vertebrae are slightly straightened in alignment.  There is diffuse degenerative change with some loss of joint space and spurring.  However no acute cervical spine fracture is seen.  The odontoid process is intact.  No prevertebral soft tissue swelling is noted.  There are the thyroid gland is slightly prominent and inhomogeneous with possible low attenuation nodule on the left.  IMPRESSION: Straightened alignment with diffuse degenerative change in osteopenia.  No acute abnormality.  Original Report Authenticated By: Joretta Bachelor, M.D.   Mr Brain Wo Contrast  01/06/2011  *RADIOLOGY REPORT*  Clinical Data: Evaluated for possible stroke.  Altered mental status.  Recent fall  MRI HEAD WITHOUT CONTRAST  Technique:  Multiplanar, multiecho pulse sequences of the brain and surrounding structures were obtained according to standard protocol without intravenous contrast.   Comparison: CT head 01/05/2011.  Findings: The patient was moderately uncooperative and could not remain motionless for the study.  Images are suboptimal.  Small or subtle lesions could be overlooked.  Motion degraded diffusion weighted images show no acute stroke. There is advanced atrophy with chronic microvascular ischemic change. A small osteoma projects from the inner table of the calvarium in the right frontal bone.  There are small foci of intraventricular hemorrhage layering posteriorly on gradient sequence left greater than right (images 11 and 12 of series 10) reflecting previous closed head injury.  No parenchymal bleed is seen.  There is no midline shift.  Intracranial vasculature is grossly patent. Pituitary and cerebellar tonsils grossly unremarkable.  The foci of hemorrhage are better seen on MR than on CT.  IMPRESSION: No acute stroke.  Subtle interventricular hemorrhage reflecting previous closed head injury.  Atrophy and small vessel disease.  Original Report Authenticated By: Staci Righter, M.D.      Medications: I have reviewed the patient's current medications.  Impression: 1. Falls, unsteady gait-unclear etiology, with recent subtle interventricular hemorrhage from closed head injury. 2. Hypertension, uncontrolled. 3. Chronic kidney disease, further improvement today. 4. Diabetes mellitus. 5. History  of congestive heart failure, clinically compensated at the present time. 6. Headaches, possibly related to nitrates.     Plan: 1. Increase hydralazine 100 mg 3 times a day. 2. Repeat chest x-ray today to make sure there is no congestive heart failure. 3. Disposition-skilled nursing facility when stable.     LOS: 7 days   Doree Albee Pager [phone]  01/12/2011, 10:48 AM

## 2011-01-13 DIAGNOSIS — J9610 Chronic respiratory failure, unspecified whether with hypoxia or hypercapnia: Secondary | ICD-10-CM | POA: Diagnosis present

## 2011-01-13 DIAGNOSIS — R404 Transient alteration of awareness: Secondary | ICD-10-CM | POA: Diagnosis present

## 2011-01-13 LAB — COMPREHENSIVE METABOLIC PANEL
Albumin: 2.5 g/dL — ABNORMAL LOW (ref 3.5–5.2)
Alkaline Phosphatase: 61 U/L (ref 39–117)
BUN: 62 mg/dL — ABNORMAL HIGH (ref 6–23)
CO2: 26 mEq/L (ref 19–32)
Chloride: 110 mEq/L (ref 96–112)
Creatinine, Ser: 2.63 mg/dL — ABNORMAL HIGH (ref 0.50–1.10)
GFR calc Af Amer: 18 mL/min — ABNORMAL LOW (ref >90)
GFR calc non Af Amer: 16 mL/min — ABNORMAL LOW (ref >90)
Glucose, Bld: 68 mg/dL — ABNORMAL LOW (ref 70–99)
Potassium: 4.2 mEq/L (ref 3.5–5.1)
Total Bilirubin: 0.4 mg/dL (ref 0.3–1.2)

## 2011-01-13 LAB — GLUCOSE, CAPILLARY
Glucose-Capillary: 84 mg/dL (ref 70–99)
Glucose-Capillary: 59 mg/dL — ABNORMAL LOW (ref 70–99)
Glucose-Capillary: 63 mg/dL — ABNORMAL LOW (ref 70–99)

## 2011-01-13 LAB — CBC
HCT: 29.9 % — ABNORMAL LOW (ref 36.0–46.0)
Hemoglobin: 9.4 g/dL — ABNORMAL LOW (ref 12.0–15.0)
MCV: 74 fL — ABNORMAL LOW (ref 78.0–100.0)
RBC: 4.04 MIL/uL (ref 3.87–5.11)
WBC: 6.5 10*3/uL (ref 4.0–10.5)

## 2011-01-13 MED ORDER — AMLODIPINE BESYLATE 10 MG PO TABS: 10.0000 mg | ORAL_TABLET | Freq: Every day | ORAL | Status: DC

## 2011-01-13 MED ORDER — HYDRALAZINE HCL 100 MG PO TABS: 100.0000 mg | ORAL_TABLET | Freq: Three times a day (TID) | ORAL | Status: DC

## 2011-01-13 MED ORDER — CARVEDILOL 25 MG PO TABS: 25.0000 mg | ORAL_TABLET | Freq: Two times a day (BID) | ORAL | Status: DC

## 2011-01-13 MED ORDER — SODIUM CHLORIDE 0.9 % IV SOLN: 250.0000 mL | INTRAVENOUS | Status: DC | PRN

## 2011-01-13 MED ORDER — SODIUM CHLORIDE 0.9 % IJ SOLN
3.0000 mL | Freq: Two times a day (BID) | INTRAMUSCULAR | Status: DC
  Administered 2011-01-13: 3 mL via INTRAVENOUS

## 2011-01-13 MED ORDER — SODIUM CHLORIDE 0.9 % IJ SOLN: 3.0000 mL | INTRAMUSCULAR | Status: DC | PRN

## 2011-01-13 MED ORDER — CLONIDINE HCL 0.1 MG PO TABS: 0.1000 mg | ORAL_TABLET | Freq: Two times a day (BID) | ORAL | Status: DC

## 2011-01-13 MED ORDER — FUROSEMIDE 40 MG PO TABS: 40.0000 mg | ORAL_TABLET | Freq: Two times a day (BID) | ORAL | Status: DC

## 2011-01-13 NOTE — Discharge Summary (Addendum)
Physician Discharge Summary  Patient ID: [name] MRN: [id number] DOB/AGE: 1925/02/13 78 y.o.  Admit date: 01/05/2011 Discharge date: 01/13/2011  Primary Care Physician:  Vic Blackbird, MD, MD   Discharge Diagnoses:    Active Problems:  DIABETES MELLITUS, CONTROLLED, WITH COMPLICATIONS  CONGESTIVE HEART FAILURE UNSPECIFIED  CHRONIC KIDNEY DISEASE STAGE V  Fall  Altered level of consciousness  Chronic respiratory failure    Current Discharge Medication List    START taking these medications   Details  carvedilol (COREG) 25 MG tablet Take 1 tablet (25 mg total) by mouth 2 (two) times daily with a meal. Qty: 60 tablet, Refills: 1    cloNIDine (CATAPRES) 0.1 MG tablet Take 1 tablet (0.1 mg total) by mouth 2 (two) times daily. Qty: 60 tablet, Refills: 11      CONTINUE these medications which have CHANGED   Details  amLODipine (NORVASC) 10 MG tablet Take 1 tablet (10 mg total) by mouth daily. Qty: 30 tablet, Refills: 1    furosemide (LASIX) 40 MG tablet Take 1 tablet (40 mg total) by mouth 2 (two) times daily. Qty: 30 tablet, Refills: 2    hydrALAZINE (APRESOLINE) 100 MG tablet Take 1 tablet (100 mg total) by mouth every 8 (eight) hours. Qty: 90 tablet, Refills: 1      CONTINUE these medications which have NOT CHANGED   Details  albuterol (PROVENTIL HFA;VENTOLIN HFA) 108 (90 BASE) MCG/ACT inhaler Inhale 2 puffs into the lungs every 2 (two) hours as needed for wheezing or shortness of breath (cough). Qty: 1 Inhaler, Refills: 0    calcitRIOL (ROCALTROL) 0.25 MCG capsule Take 0.25 mcg by mouth daily.      Carboxymethylcellul-Glycerin (OPTIVE) 0.5-0.9 % SOLN Apply to eye. One drop in both eyes two times a day      feeding supplement (GLUCERNA SHAKE) LIQD Take 237 mLs by mouth daily.      glucose blood test strip 1 each by Other route as needed. Use as instructed     insulin aspart protamine-insulin aspart (NOVOLOG MIX 70/30 FLEXPEN) (70-30) 100 UNIT/ML  injection Inject 5-8 Units into the skin 2 (two) times daily with a meal. 8 units every morning ,5units every evening Patient does not use insulin if her CBG is lower than 150    Insulin Pen Needle 29G X 12MM MISC Inject into the skin. Use with insuline pen two times a day     ONE TOUCH LANCETS MISC by Does not apply route.      POLY-IRON 150 150 MG capsule TAKE 1 CAPSULE BY MOUTH ONCE A DAY. Qty: 30 each, Refills: 6    citalopram (CELEXA) 20 MG tablet TAKE (1) TABLET BY MOUTH ONCE DAILY. Qty: 30 tablet, Refills: 3    fluticasone (FLONASE) 50 MCG/ACT nasal spray 2 sprays by Nasal route daily.      potassium chloride (K-DUR) 10 MEQ tablet Take 1 tablet (10 mEq total) by mouth daily. Take with your evening dose of lasix Qty: 30 tablet, Refills: 6    TENEX 2 MG tablet TAKE ONE TABLET BY MOUTH AT BEDTIME. Qty: 30 each, Refills: 3    ZOCOR 10 MG tablet TAKE (1) TABLET BY MOUTH IN THE EVENING. Qty: 30 each, Refills: 3      STOP taking these medications     gabapentin (NEURONTIN) 600 MG tablet      isosorbide dinitrate (ISOCHRON) 40 MG CR tablet      NEXIUM 40 MG capsule        Discharge  Exam: Blood pressure 193/75, pulse 69, temperature 97.6 F (36.4 C), temperature source Oral, resp. rate 18, height 5\' 5"  (1.651 m), weight 83 kg (182 lb 15.7 oz), SpO2 97.00%. NAD CTA B S1, S2, RRR Soft, NT, BS+ Trace edema b/l  Disposition and Follow-up:  Follow up with primary MD in 2 weeks.  Patient worked with physical therapy and walked more that 171ft.  She wishes to return home.  She will be set up with home health care and home oxygen.  Consults:  none   Significant Diagnostic Studies:  Dg Chest 1 View  01/05/2011  *RADIOLOGY REPORT*  Clinical Data: Fever, altered level of consciousness for several days  CHEST - 1 VIEW  Comparison: Portable chest x-ray of 12/27/2010  Findings: Moderate cardiomegaly remains with perhaps mild pulmonary vascular congestion.  Slight elevation of the  left hemidiaphragm is stable.  No acute bony abnormality is seen.  IMPRESSION: Stable moderate cardiomegaly with question of mild pulmonary vascular congestion.  Original Report Authenticated By: Joretta Bachelor, M.D.   Dg Hip Complete Right  01/06/2011  *RADIOLOGY REPORT*  Clinical Data: Fall, right-sided hip and ankle pain  RIGHT HIP - COMPLETE 2+ VIEW  Comparison: Plain films 06/15/2010  Findings: Hips are located.  Dedicated views of the right hip demonstrate no femoral neck fracture.  No pelvic fracture or sacral fracture.  Moderate volume stool in the ascending colon.  IMPRESSION: No evidence of pelvic fracture or left hip fracture.  Original Report Authenticated By: Suzy Bouchard, M.D.   Dg Ankle 2 Views Right  01/06/2011  *RADIOLOGY REPORT*  Clinical Data: Right-sided pain, ankle pain  RIGHT ANKLE - 2 VIEW  Comparison: None.  Findings: Ankle mortise intact.  The talar dome is normal.  No evidence of malleolar fracture.  Osteopenia.  IMPRESSION: No evidence of fracture on two views of the ankle.  Original Report Authenticated By: Suzy Bouchard, M.D.   Ct Head Wo Contrast  01/05/2011  *RADIOLOGY REPORT*  Clinical Data:  Of 2 days ago with bruising in the right side of the neck, altered mental status, weakness  CT HEAD WITHOUT CONTRAST CT CERVICAL SPINE WITHOUT CONTRAST  Technique:  Multidetector CT imaging of the head and cervical spine was performed following the standard protocol without intravenous contrast.  Multiplanar CT image reconstructions of the cervical spine were also generated.  Comparison:  CT brain scan of 04/30/2010  CT HEAD  Findings: The ventricular system remains prominent, as are the cortical sulci and cerebellar folia, consistent with diffuse atrophy.  The septum is midline position mild small vessel ischemic changes present in the prep ventricle white matter.  No hemorrhage, mass lesion, or acute infarction is seen.  There is mucosal thickening in the left maxillary sinus  but no air-fluid level is seen.  Some opacification of ethmoid air cells is present and there is a small amount of fluid within the left partition of the sphenoid sinus as well the opacification of the right frontal sinus is noted.  On bone window images no acute calvarial abnormality is seen.  IMPRESSION:  1.  Stable atrophy and small vessel disease.  No acute intracranial abnormality. 2.  Diffuse sinus disease.  CT CERVICAL SPINE  Findings: The cervical vertebrae are slightly straightened in alignment.  There is diffuse degenerative change with some loss of joint space and spurring.  However no acute cervical spine fracture is seen.  The odontoid process is intact.  No prevertebral soft tissue swelling is noted.  There are the  thyroid gland is slightly prominent and inhomogeneous with possible low attenuation nodule on the left.  IMPRESSION: Straightened alignment with diffuse degenerative change in osteopenia.  No acute abnormality.  Original Report Authenticated By: Joretta Bachelor, M.D.   Ct Cervical Spine Wo Contrast  01/05/2011  *RADIOLOGY REPORT*  Clinical Data:  Of 2 days ago with bruising in the right side of the neck, altered mental status, weakness  CT HEAD WITHOUT CONTRAST CT CERVICAL SPINE WITHOUT CONTRAST  Technique:  Multidetector CT imaging of the head and cervical spine was performed following the standard protocol without intravenous contrast.  Multiplanar CT image reconstructions of the cervical spine were also generated.  Comparison:  CT brain scan of 04/30/2010  CT HEAD  Findings: The ventricular system remains prominent, as are the cortical sulci and cerebellar folia, consistent with diffuse atrophy.  The septum is midline position mild small vessel ischemic changes present in the prep ventricle white matter.  No hemorrhage, mass lesion, or acute infarction is seen.  There is mucosal thickening in the left maxillary sinus but no air-fluid level is seen.  Some opacification of ethmoid air  cells is present and there is a small amount of fluid within the left partition of the sphenoid sinus as well the opacification of the right frontal sinus is noted.  On bone window images no acute calvarial abnormality is seen.  IMPRESSION:  1.  Stable atrophy and small vessel disease.  No acute intracranial abnormality. 2.  Diffuse sinus disease.  CT CERVICAL SPINE  Findings: The cervical vertebrae are slightly straightened in alignment.  There is diffuse degenerative change with some loss of joint space and spurring.  However no acute cervical spine fracture is seen.  The odontoid process is intact.  No prevertebral soft tissue swelling is noted.  There are the thyroid gland is slightly prominent and inhomogeneous with possible low attenuation nodule on the left.  IMPRESSION: Straightened alignment with diffuse degenerative change in osteopenia.  No acute abnormality.  Original Report Authenticated By: Joretta Bachelor, M.D.   Dg Knee Complete 4 Views Right  01/05/2011  *RADIOLOGY REPORT*  Clinical Data: [place] several days ago with pain  RIGHT KNEE - COMPLETE 4+ VIEW  Comparison: MR right knee of 02/25/2010  Findings: Tricompartmental degenerative joint disease is again noted with loss of joint space, sclerosis and spurring involving the compartment.  There is also chondrocalcinosis present which can be seen with CPPD or osteoarthritis.  No acute fracture is seen.  A small right knee joint effusion is present.  IMPRESSION: 1.  Significant tricompartmental degenerative joint disease with small joint effusion.  No fracture. 2.  Chondrocalcinosis.  Original Report Authenticated By: Joretta Bachelor, M.D.    Brief H and P: For complete details please refer to admission H and P, but in brief This is an 78 year old female with a past medical history significant for chronic renal insufficiency stage IV, hypertension, history of stroke with right hemiparesis , diabetes mellitus, history obtained from the patient as there  is no family around, patient is lethargic during conversation, she complain of experienced a fall 3 days ago, hitting her head really hard, since then she complain of right ankle pain and foot pain, and she become weak, very lethargic with report from the emergency room who did the conversation with the son that she have fever associated with chills, currently patient denies any nausea or vomiting, denies any change in bowel habit, denies any abdominal pain, she denies any hip pain  she admitted heart pain mainly in her foot and ankle, she denies any shortness of breath,, denies any hemoptysis, or hematemesis, on the emergency room patient has a CT head without contrast which was negative for bleeding and CT cervical spine which was negative,     Hospital Course:  Patient was admitted to the hospital for falls. She was seen by physical therapy for strength training, and has significantly improved. Currently she is ambulating more than 100 feet. She appear to be approaching her baseline.  Regarding her CHF she was continued on Lasix. Initial thought was that she was initially dehydrated and the patient was given fluids. Since that time her fluids have been discontinued and she was continued on Lasix. We will continue her on her outpatient dose.  Hypertension patient has had significant hypertension which is chronic. Her Coreg and her hydralazine were increased in dose. We will also add clonidine for further blood pressure management. Further titration be done as an outpatient  Headaches, this has since resolved.  Altered mental status, resolved, etiology is not entirely clear  Chronic respiratory failure. Patient reports that she is on 2 L of oxygen at home 24 hours. We will discharge her on the same.  Patient may followup with her primary care physician for further management. She's either discharge home. From a physical therapy standpoint, it seems appropriate for her to discharge home.    Time  spent on Discharge: 64mins  Signed: [facility] 01/13/2011, 4:49 PM

## 2011-01-13 NOTE — Progress Notes (Signed)
CBG: 59  Treatment: 4 oz orange juice  Symptoms: none  Follow-up CBG: Time:1630 CBG Result:63  Possible Reasons for Event: unknown  Comments/MD notified:no   Janalyn Rouse, Sande Rives

## 2011-01-13 NOTE — Progress Notes (Signed)
Pt much improved with physical therapy and has decided to return home.  CM aware for home health needs.  CSW to sign off.   Salome Arnt

## 2011-01-13 NOTE — Progress Notes (Signed)
Oxygen saturation on room air after ambulating in hall with PT was 83%, pt c/o "SOB", oxygen reapplied at 2L/M via [place], oxygen saturation increased to 93 %.

## 2011-01-13 NOTE — Progress Notes (Signed)
Physical Therapy Treatment Patient Details Name: [name] MRN: [id number] DOB: December 03, 1925 Today's Date: 01/13/2011 Time: 8: 2- 9:35 Charge: gait 40 min PT Assessment/Plan  PT - Assessment/Plan Comments on Treatment Session: Pt motivated to ambulate further than originally intended today, ambulated without O2 per pt. request with O2 sat 86% without the O2.  O2 sat 91% at end of session with O2.   PT Goals  Acute Rehab PT Goals PT Goal Formulation: With patient/family Time For Goal Achievement: 2 weeks Pt will go Supine/Side to Sit: with modified independence;with HOB 0 degrees Pt will go Sit to Supine/Side: with modified independence;with HOB 0 degrees Pt will go Sit to Stand: with supervision Pt will go Stand to Sit: with supervision Pt will Transfer Bed to Chair/Chair to Bed: with supervision Pt will Ambulate: 16 - 50 feet;with min assist;with rolling walker  PT Treatment Precautions/Restrictions  Precautions Precautions: Fall Restrictions Weight Bearing Restrictions: No Mobility (including Balance) Bed Mobility Bed Mobility: Yes Supine to Sit: 5: Supervision Supine to Sit Details (indicate cue type and reason): cueing with UE A with rolling to pushing up to sitting position Sitting - Scoot to Edge of Bed: 4: Min assist Transfers Transfers: Yes Sit to Stand: 4: Min assist Sit to Stand Details (indicate cue type and reason): cueing to utilize UE A with sit to stand, min A required to stand Stand to Sit: 5: Supervision;4: Min assist Stand to Sit Details: min assistance to help control descent to sit Ambulation/Gait Ambulation/Gait: Yes Ambulation/Gait Assistance: 4: Min assist Ambulation/Gait Assistance Details (indicate cue type and reason): pt motiviated to amb further than originally intended, pt ambulated in hospital hallway requested to do so without O2, upon return O2 sat 86%, returned to 91% with O2.at end of session.  min cueing for posture to increase stride  length with gait. Ambulation Distance (Feet): 100 Feet Assistive device: Rolling walker Gait Pattern: Trunk flexed;Decreased stride length Gait velocity: slow    Exercise    End of Session PT - End of Session Equipment Utilized During Treatment: Gait belt Activity Tolerance: Patient tolerated treatment well Patient left: in chair;with call bell in reach;with bed alarm set Nurse Communication: Mobility status for transfers General Behavior During Session: [facility] for tasks performed Cognition: [facility] for tasks performed  Aldona Lento 01/13/2011, 10:11 AM

## 2011-01-13 NOTE — Progress Notes (Signed)
Px blood sugar was 63 @ 1630. Px asymptomatic. Gave px 4 oz OJ. Was to repeat blood sugar in 15 minutes but px was to be discharged. Told px and son she needs to eat. Px and son verbalized and understanding and son said he would make sure she eats.

## 2011-01-13 NOTE — Progress Notes (Signed)
Px given discharge instructions and medication sheets. Px and family verbalized an understanding. Px alert & oriented x 4. Px left via wheelchair with son.

## 2011-01-14 NOTE — Progress Notes (Signed)
CARE MANAGEMENT NOTE 01/14/2011  Patient:  [name], [name]   Account Number:  [id number]  Date Initiated:  01/13/2011  Documentation initiated by:  Lance Coon Assessment:   78 yr old female admitted with fever, falls.pt lives at home pt has an aide daily from [facility]     Action/Plan:   Anticipated DC Date:  01/13/2011   Anticipated DC Plan:  [place] referral  Clinical Social Worker      DC Planning Services  CM consult      Choice offered to / List presented to:  C-1 Patient        [place] arranged  [facility]      [place].   Status of service:   Medicare Important Message given?   (If response is "NO", the following Medicare IM given date fields will be blank) Date Medicare IM given:   Date Additional Medicare IM given:    Discharge Disposition:  [place]  Per UR Regulation:  Reviewed for med. necessity/level of care/duration of stay  Comments:  01/12/2010 Remo Lipps rn bsn  pt was going to go to snf but has greatly improved in his ambulation. Pt is recommending home health pt. Pt states she is going home and is not going to be placed, pt d/c home arranged above hh servies.

## 2011-01-15 ENCOUNTER — Emergency Department (HOSPITAL_COMMUNITY): Payer: Medicare HMO

## 2011-01-15 ENCOUNTER — Encounter (HOSPITAL_COMMUNITY): Payer: Self-pay

## 2011-01-15 ENCOUNTER — Telehealth: Payer: Self-pay | Admitting: Family Medicine

## 2011-01-15 ENCOUNTER — Emergency Department (HOSPITAL_COMMUNITY)
Admission: EM | Admit: 2011-01-15 | Discharge: 2011-01-15 | Disposition: A | Discharge status: Home / Self Care | Payer: Medicare HMO | Attending: Emergency Medicine | Admitting: Emergency Medicine

## 2011-01-15 ENCOUNTER — Other Ambulatory Visit: Payer: Self-pay

## 2011-01-15 DIAGNOSIS — K589 Irritable bowel syndrome without diarrhea: Secondary | ICD-10-CM | POA: Insufficient documentation

## 2011-01-15 DIAGNOSIS — Z8673 Personal history of transient ischemic attack (TIA), and cerebral infarction without residual deficits: Secondary | ICD-10-CM | POA: Insufficient documentation

## 2011-01-15 DIAGNOSIS — F411 Generalized anxiety disorder: Secondary | ICD-10-CM | POA: Insufficient documentation

## 2011-01-15 DIAGNOSIS — Z794 Long term (current) use of insulin: Secondary | ICD-10-CM | POA: Insufficient documentation

## 2011-01-15 DIAGNOSIS — H269 Unspecified cataract: Secondary | ICD-10-CM | POA: Insufficient documentation

## 2011-01-15 DIAGNOSIS — R109 Unspecified abdominal pain: Secondary | ICD-10-CM | POA: Insufficient documentation

## 2011-01-15 DIAGNOSIS — E785 Hyperlipidemia, unspecified: Secondary | ICD-10-CM | POA: Insufficient documentation

## 2011-01-15 DIAGNOSIS — Z8589 Personal history of malignant neoplasm of other organs and systems: Secondary | ICD-10-CM | POA: Insufficient documentation

## 2011-01-15 DIAGNOSIS — N390 Urinary tract infection, site not specified: Secondary | ICD-10-CM | POA: Insufficient documentation

## 2011-01-15 DIAGNOSIS — I129 Hypertensive chronic kidney disease with stage 1 through stage 4 chronic kidney disease, or unspecified chronic kidney disease: Secondary | ICD-10-CM | POA: Insufficient documentation

## 2011-01-15 DIAGNOSIS — R9431 Abnormal electrocardiogram [ECG] [EKG]: Secondary | ICD-10-CM | POA: Insufficient documentation

## 2011-01-15 DIAGNOSIS — I509 Heart failure, unspecified: Secondary | ICD-10-CM | POA: Insufficient documentation

## 2011-01-15 DIAGNOSIS — N189 Chronic kidney disease, unspecified: Secondary | ICD-10-CM | POA: Insufficient documentation

## 2011-01-15 DIAGNOSIS — E119 Type 2 diabetes mellitus without complications: Secondary | ICD-10-CM | POA: Insufficient documentation

## 2011-01-15 LAB — URINALYSIS, ROUTINE W REFLEX MICROSCOPIC
Bilirubin Urine: NEGATIVE
Glucose, UA: NEGATIVE mg/dL
Ketones, ur: NEGATIVE mg/dL
Nitrite: NEGATIVE
Specific Gravity, Urine: 1.02 (ref 1.005–1.030)
pH: 5.5 (ref 5.0–8.0)

## 2011-01-15 LAB — COMPREHENSIVE METABOLIC PANEL
ALT: 7 U/L (ref 0–35)
Albumin: 2.8 g/dL — ABNORMAL LOW (ref 3.5–5.2)
Alkaline Phosphatase: 71 U/L (ref 39–117)
BUN: 66 mg/dL — ABNORMAL HIGH (ref 6–23)
Chloride: 108 mEq/L (ref 96–112)
Potassium: 4.5 mEq/L (ref 3.5–5.1)
Sodium: 140 mEq/L (ref 135–145)
Total Bilirubin: 0.4 mg/dL (ref 0.3–1.2)
Total Protein: 6.6 g/dL (ref 6.0–8.3)

## 2011-01-15 LAB — CBC
Hemoglobin: 9.4 g/dL — ABNORMAL LOW (ref 12.0–15.0)
MCH: 23.8 pg — ABNORMAL LOW (ref 26.0–34.0)
MCHC: 32.2 g/dL (ref 30.0–36.0)
Platelets: 161 10*3/uL (ref 150–400)
RBC: 3.95 MIL/uL (ref 3.87–5.11)

## 2011-01-15 LAB — URINE MICROSCOPIC-ADD ON

## 2011-01-15 LAB — DIFFERENTIAL
Basophils Relative: 0 % (ref 0–1)
Eosinophils Absolute: 0 10*3/uL (ref 0.0–0.7)
Lymphs Abs: 0.9 10*3/uL (ref 0.7–4.0)
Monocytes Relative: 7 % (ref 3–12)
Neutro Abs: 3.9 10*3/uL (ref 1.7–7.7)
Neutrophils Relative %: 75 % (ref 43–77)

## 2011-01-15 MED ORDER — MORPHINE SULFATE 4 MG/ML IJ SOLN
4.0000 mg | Freq: Once | INTRAMUSCULAR | Status: AC
  Administered 2011-01-15: 4 mg via INTRAVENOUS
  Filled 2011-01-15: qty 1

## 2011-01-15 MED ORDER — CEFUROXIME AXETIL 250 MG PO TABS
250.0000 mg | ORAL_TABLET | Freq: Once | ORAL | Status: AC
  Administered 2011-01-15: 250 mg via ORAL
  Filled 2011-01-15: qty 1

## 2011-01-15 MED ORDER — CEFUROXIME AXETIL 250 MG PO TABS: 250.0000 mg | ORAL_TABLET | Freq: Two times a day (BID) | ORAL | Status: DC

## 2011-01-15 MED ORDER — SODIUM CHLORIDE 0.9 % IV SOLN
999.0000 mL | INTRAVENOUS | Status: DC
  Administered 2011-01-15: 1000 mL via INTRAVENOUS

## 2011-01-15 MED ORDER — ONDANSETRON HCL 4 MG/2ML IJ SOLN
4.0000 mg | Freq: Once | INTRAMUSCULAR | Status: AC
  Administered 2011-01-15: 4 mg via INTRAVENOUS
  Filled 2011-01-15: qty 2

## 2011-01-15 NOTE — ED Provider Notes (Signed)
History     CSN: [id number]  Arrival date & time 01/15/11  1344   First MD Initiated Contact with Patient 01/15/11 1357      Chief Complaint  Patient presents with  . Abdominal Pain    HPI Patient presents to emergency room with complaints of abdominal pain that started last night. Patient states the pain is in her upper abdomen. It is severe. It seems to get better when she urinates but then will return shortly. The pain does not radiate anywhere. She denies any nausea vomiting or diarrhea but has not had a good appetite. She had a normal BM last night. Patient denies any fevers or similar symptoms. She denies any chest pain but has had trouble with her breathing. Patient was recently discharged from the hospital 2 days ago after an admission on December 25. Patient had been intubated for altered mental status. She was found to have a CHF exacerbation associated with chronic renal failure. The etiology of her altered mental status was unclear. Patient was discharged home with home health and she was to continue on oxygen at home. Patient called her son today because of the abdominal pain.   Past Medical History  Diagnosis Date  . Anxiety   . Diabetes mellitus, type 2   . Hypertension   . Stroke     residual right side weakness and pain  . IBS (irritable bowel syndrome)     with costipation  . Overactive bladder   . Cataracts, both eyes   . Gynecologic cancer   . Hyperlipidemia   . CKD (chronic kidney disease), stage V   . Anemia in CKD (chronic kidney disease)   . SVT (supraventricular tachycardia)   . CHF (congestive heart failure)   . Arthritis     gout  . SOB (shortness of breath)     nml spirometry 09/05/06    Past Surgical History  Procedure Date  . Vesicovaginal fistula closure w/ tah   . Av fistula placement, brachiocephalic [id number]    left arm   by Dr. Bridgett Larsson  . Tee without cardioversion 2011    2/2 Strep Bovis infection    No family history on  file.  History  Substance Use Topics  . Smoking status: Never Smoker   . Smokeless tobacco: Not on file  . Alcohol Use: No    OB History    Grav Para Term Preterm Abortions TAB SAB Ect Mult Living                  Review of Systems  All other systems reviewed and are negative.    Allergies  Review of patient's allergies indicates no known allergies.  Home Medications   Current Outpatient Rx  Name Route Sig Dispense Refill  . ALBUTEROL SULFATE HFA 108 (90 BASE) MCG/ACT IN AERS Inhalation Inhale 2 puffs into the lungs every 2 (two) hours as needed for wheezing or shortness of breath (cough). 1 Inhaler 0  . AMLODIPINE BESYLATE 10 MG PO TABS Oral Take 1 tablet (10 mg total) by mouth daily. 30 tablet 1  . CALCITRIOL 0.25 MCG PO CAPS Oral Take 0.25 mcg by mouth daily.      Marland Kitchen CARBOXYMETHYLCELLUL-GLYCERIN 0.5-0.9 % OP SOLN Ophthalmic Apply to eye. One drop in both eyes two times a day      . CARVEDILOL 25 MG PO TABS Oral Take 1 tablet (25 mg total) by mouth 2 (two) times daily with a meal. 60 tablet 1  .  CITALOPRAM HYDROBROMIDE 20 MG PO TABS  TAKE (1) TABLET BY MOUTH ONCE DAILY. 30 tablet 3  . CLONIDINE HCL 0.1 MG PO TABS Oral Take 1 tablet (0.1 mg total) by mouth 2 (two) times daily. 60 tablet 11  . GLUCERNA SHAKE PO LIQD Oral Take 237 mLs by mouth daily.      Marland Kitchen FLUTICASONE PROPIONATE 50 MCG/ACT NA SUSP Nasal 2 sprays by Nasal route daily.      . FUROSEMIDE 40 MG PO TABS Oral Take 1 tablet (40 mg total) by mouth 2 (two) times daily. 30 tablet 2  . GLUCOSE BLOOD VI STRP Other 1 each by Other route as needed. Use as instructed     . HYDRALAZINE HCL 100 MG PO TABS Oral Take 1 tablet (100 mg total) by mouth every 8 (eight) hours. 90 tablet 1  . INSULIN ASPART PROT & ASPART (70-30) 100 UNIT/ML [place] SUSP Subcutaneous Inject 5-8 Units into the skin 2 (two) times daily with a meal. 8 units every morning ,5units every evening Patient does not use insulin if her CBG is lower than 150    .  INSULIN PEN NEEDLE 29G X 12MM MISC Subcutaneous Inject into the skin. Use with insuline pen two times a day     . ONETOUCH LANCETS MISC Does not apply by Does not apply route.      Marland Kitchen POLY-IRON 150 150 MG PO CAPS  TAKE 1 CAPSULE BY MOUTH ONCE A DAY. 30 each 6  . POTASSIUM CHLORIDE ER 10 MEQ PO TBCR Oral Take 1 tablet (10 mEq total) by mouth daily. Take with your evening dose of lasix 30 tablet 6  . TENEX 2 MG PO TABS  TAKE ONE TABLET BY MOUTH AT BEDTIME. 30 each 3  . ZOCOR 10 MG PO TABS  TAKE (1) TABLET BY MOUTH IN THE EVENING. 30 each 3    BP 183/73  Pulse 70  Temp(Src) 98.1 F (36.7 C) (Oral)  Resp 24  SpO2 100%  Physical Exam  Nursing note and vitals reviewed. Constitutional: She appears distressed.  HENT:  Head: Normocephalic and atraumatic.  Right Ear: External ear normal.  Left Ear: External ear normal.  Eyes: Conjunctivae are normal. Right eye exhibits no discharge. Left eye exhibits no discharge. No scleral icterus.  Neck: Neck supple. No tracheal deviation present.  Cardiovascular: Normal rate, regular rhythm and intact distal pulses.   Pulmonary/Chest: Effort normal and breath sounds normal. No stridor. No respiratory distress. She has no wheezes. She has no rales. She exhibits no tenderness.  Abdominal: Soft. Bowel sounds are normal. She exhibits no distension and no mass. There is tenderness (upper abdomen, no discrete area). There is no rebound and no guarding.  Musculoskeletal: She exhibits no edema and no tenderness.  Neurological: She is alert. No sensory deficit. Cranial nerve deficit:  no gross defecits noted. She exhibits normal muscle tone. She displays no seizure activity. Coordination normal.  Skin: Skin is warm and dry. No rash noted. She is not diaphoretic. No pallor.  Psychiatric: Her behavior is normal. Judgment normal.    ED Course  Procedures (including critical care time)  Date: 01/15/2011  Rate: 71  Rhythm: normal sinus rhythm  QRS Axis: normal   Intervals: normal, prolonged qt  ST/T Wave abnormalities: inverted t waves, inferior and laterally  Conduction Disutrbances:none  Narrative Interpretation: no significant changes  Old EKG Reviewed: no sig changes   Labs Reviewed  CBC - Abnormal; Notable for the following:    Hemoglobin 9.4 (*)  HCT 29.2 (*)    MCV 73.9 (*)    MCH 23.8 (*)    RDW 16.2 (*)    All other components within normal limits  COMPREHENSIVE METABOLIC PANEL - Abnormal; Notable for the following:    Glucose, Bld 145 (*)    BUN 66 (*)    Creatinine, Ser 2.74 (*)    Albumin 2.8 (*)    GFR calc non Af Amer 15 (*)    GFR calc Af Amer 17 (*)    All other components within normal limits  URINALYSIS, ROUTINE W REFLEX MICROSCOPIC - Abnormal; Notable for the following:    Hgb urine dipstick TRACE (*)    Protein, ur 100 (*)    All other components within normal limits  DIFFERENTIAL  LIPASE, BLOOD  TROPONIN I  URINE MICROSCOPIC-ADD ON  URINE CULTURE   Ct Abdomen Pelvis Wo Contrast  01/15/2011  *RADIOLOGY REPORT*  Clinical Data: Upper abdominal pain, gynecologic cancer status post hysterectomy  CT ABDOMEN AND PELVIS WITHOUT CONTRAST  Technique:  Multidetector CT imaging of the abdomen and pelvis was performed following the standard protocol without intravenous contrast.  Comparison: 11/01/2009  Findings: Small bilateral pleural effusions.  Associated lower lobe opacities, possibly atelectasis.  No focal hepatic lesions.  Mild left pneumobilia, correlate for prior sphincterotomy.  Unenhanced spleen, pancreas, and adrenal glands are within normal limits.  Distended gallbladder with layering small gallstones (series 2/image 38), similar to 2011.  No associated inflammatory changes by CT.  No intrahepatic or extrahepatic ductal dilatation.  Bilateral renal cortical atrophy.  No renal calculi or hydronephrosis.  No evidence of bowel obstruction.  No colonic wall thickening or inflammatory changes.  Atherosclerotic  calcifications of the abdominal aorta and branch vessels.  No abdominopelvic ascites.  No suspicious abdominopelvic lymphadenopathy.  Status post hysterectomy.  No adnexal masses.  Bladder is within normal limits.  Degenerative changes of the visualized thoracolumbar spine.  IMPRESSION: Distended gallbladder with cholelithiasis, similar to 2011.  No associated inflammatory changes by CT.  No evidence of bowel obstruction.  Small bilateral pleural effusions with associated lower lobe opacities, possibly atelectasis.  Original Report Authenticated By: Julian Hy, M.D.   Dg Abd Acute W/chest  01/15/2011  *RADIOLOGY REPORT*  Clinical Data: Abdominal pain  ACUTE ABDOMEN SERIES (ABDOMEN 2 VIEW & CHEST 1 VIEW)  Comparison: CT abdomen pelvis dated 11/02/2010  Findings: Cardiomegaly with pulmonary vascular congestion.  No frank interstitial edema. No pleural effusion or pneumothorax.  Nonobstructive bowel gas pattern.  Moderate stool throughout the colon.  No evidence of free air under the diaphragm on the upright view.  Degenerative changes of the visualized thoracolumbar spine.  Vascular calcifications.  IMPRESSION: Cardiomegaly with pulmonary vascular congestion.  No frank interstitial edema.  No evidence of small bowel obstruction or free air.  Moderate stool throughout the colon.  Original Report Authenticated By: Julian Hy, M.D.     1. Abdominal pain   2. UTI (lower urinary tract infection)       MDM  The patient's CT scan does not show any evidence of acute abnormality. Patient is feeling better at this time. The urinalysis that shows a few bacteria as well as a few white blood cells. It is possible she has a urinary tract infection. At this point I do not feel there is any evidence to suggest an acute emergency medical condition. Patient will be discharged home with a prescription for antibiotics. She and her son are comfortable with this plan and she is anxious  to go home. She does have  home health scheduled to visit her due to her recent hospitalization.        Kathalene Frames, MD 01/15/11 [phone]

## 2011-01-15 NOTE — ED Notes (Signed)
Per ems, pt reports upper abd pain since last night.  Pt denies any n/v/d.  Pt reports her last bm was last night and normal.  nad noted

## 2011-01-15 NOTE — Telephone Encounter (Signed)
Called back and she was at ER

## 2011-01-17 ENCOUNTER — Inpatient Hospital Stay (HOSPITAL_COMMUNITY)
Admission: EM | Admit: 2011-01-17 | Discharge: 2011-01-20 | DRG: 292 | Disposition: A | Discharge status: Home Health | Payer: Medicare HMO | Attending: Internal Medicine | Admitting: Internal Medicine

## 2011-01-17 ENCOUNTER — Other Ambulatory Visit: Payer: Self-pay

## 2011-01-17 ENCOUNTER — Emergency Department (HOSPITAL_COMMUNITY): Payer: Medicare HMO

## 2011-01-17 ENCOUNTER — Encounter (HOSPITAL_COMMUNITY): Payer: Self-pay | Admitting: *Deleted

## 2011-01-17 DIAGNOSIS — J9610 Chronic respiratory failure, unspecified whether with hypoxia or hypercapnia: Secondary | ICD-10-CM

## 2011-01-17 DIAGNOSIS — E785 Hyperlipidemia, unspecified: Secondary | ICD-10-CM

## 2011-01-17 DIAGNOSIS — R0609 Other forms of dyspnea: Secondary | ICD-10-CM

## 2011-01-17 DIAGNOSIS — N039 Chronic nephritic syndrome with unspecified morphologic changes: Secondary | ICD-10-CM | POA: Diagnosis present

## 2011-01-17 DIAGNOSIS — E119 Type 2 diabetes mellitus without complications: Secondary | ICD-10-CM | POA: Diagnosis present

## 2011-01-17 DIAGNOSIS — K589 Irritable bowel syndrome without diarrhea: Secondary | ICD-10-CM

## 2011-01-17 DIAGNOSIS — Z8679 Personal history of other diseases of the circulatory system: Secondary | ICD-10-CM

## 2011-01-17 DIAGNOSIS — R0600 Dyspnea, unspecified: Secondary | ICD-10-CM | POA: Diagnosis present

## 2011-01-17 DIAGNOSIS — G4700 Insomnia, unspecified: Secondary | ICD-10-CM

## 2011-01-17 DIAGNOSIS — I10 Essential (primary) hypertension: Secondary | ICD-10-CM

## 2011-01-17 DIAGNOSIS — R112 Nausea with vomiting, unspecified: Secondary | ICD-10-CM

## 2011-01-17 DIAGNOSIS — N185 Chronic kidney disease, stage 5: Secondary | ICD-10-CM

## 2011-01-17 DIAGNOSIS — F411 Generalized anxiety disorder: Secondary | ICD-10-CM

## 2011-01-17 DIAGNOSIS — R0602 Shortness of breath: Secondary | ICD-10-CM

## 2011-01-17 DIAGNOSIS — M129 Arthropathy, unspecified: Secondary | ICD-10-CM

## 2011-01-17 DIAGNOSIS — I5033 Acute on chronic diastolic (congestive) heart failure: Principal | ICD-10-CM | POA: Diagnosis present

## 2011-01-17 DIAGNOSIS — K635 Polyp of colon: Secondary | ICD-10-CM

## 2011-01-17 DIAGNOSIS — S40029A Contusion of unspecified upper arm, initial encounter: Secondary | ICD-10-CM

## 2011-01-17 DIAGNOSIS — R609 Edema, unspecified: Secondary | ICD-10-CM

## 2011-01-17 DIAGNOSIS — I1311 Hypertensive heart and chronic kidney disease without heart failure, with stage 5 chronic kidney disease, or end stage renal disease: Secondary | ICD-10-CM | POA: Diagnosis present

## 2011-01-17 DIAGNOSIS — E1129 Type 2 diabetes mellitus with other diabetic kidney complication: Secondary | ICD-10-CM | POA: Diagnosis present

## 2011-01-17 DIAGNOSIS — E118 Type 2 diabetes mellitus with unspecified complications: Secondary | ICD-10-CM

## 2011-01-17 DIAGNOSIS — W19XXXA Unspecified fall, initial encounter: Secondary | ICD-10-CM

## 2011-01-17 DIAGNOSIS — R0989 Other specified symptoms and signs involving the circulatory and respiratory systems: Secondary | ICD-10-CM

## 2011-01-17 DIAGNOSIS — N289 Disorder of kidney and ureter, unspecified: Secondary | ICD-10-CM

## 2011-01-17 DIAGNOSIS — H269 Unspecified cataract: Secondary | ICD-10-CM

## 2011-01-17 DIAGNOSIS — J309 Allergic rhinitis, unspecified: Secondary | ICD-10-CM

## 2011-01-17 DIAGNOSIS — R531 Weakness: Secondary | ICD-10-CM

## 2011-01-17 DIAGNOSIS — I509 Heart failure, unspecified: Secondary | ICD-10-CM

## 2011-01-17 DIAGNOSIS — I120 Hypertensive chronic kidney disease with stage 5 chronic kidney disease or end stage renal disease: Secondary | ICD-10-CM | POA: Diagnosis present

## 2011-01-17 DIAGNOSIS — R404 Transient alteration of awareness: Secondary | ICD-10-CM

## 2011-01-17 DIAGNOSIS — D631 Anemia in chronic kidney disease: Secondary | ICD-10-CM

## 2011-01-17 LAB — URINALYSIS, ROUTINE W REFLEX MICROSCOPIC
Hgb urine dipstick: NEGATIVE
Nitrite: NEGATIVE
Specific Gravity, Urine: 1.015 (ref 1.005–1.030)
Urobilinogen, UA: 0.2 mg/dL (ref 0.0–1.0)
pH: 6 (ref 5.0–8.0)

## 2011-01-17 LAB — DIFFERENTIAL
Band Neutrophils: 0 % (ref 0–10)
Basophils Absolute: 0 10*3/uL (ref 0.0–0.1)
Basophils Relative: 0 % (ref 0–1)
Eosinophils Absolute: 0 10*3/uL (ref 0.0–0.7)
Eosinophils Relative: 0 % (ref 0–5)
Metamyelocytes Relative: 0 %
Monocytes Absolute: 0.2 10*3/uL (ref 0.1–1.0)
Monocytes Relative: 3 % (ref 3–12)
nRBC: 0 /100 WBC

## 2011-01-17 LAB — CBC
HCT: 26.1 % — ABNORMAL LOW (ref 36.0–46.0)
Hemoglobin: 8.4 g/dL — ABNORMAL LOW (ref 12.0–15.0)
MCH: 23.5 pg — ABNORMAL LOW (ref 26.0–34.0)
MCV: 73.1 fL — ABNORMAL LOW (ref 78.0–100.0)
RBC: 3.57 MIL/uL — ABNORMAL LOW (ref 3.87–5.11)
WBC: 6 10*3/uL (ref 4.0–10.5)

## 2011-01-17 LAB — COMPREHENSIVE METABOLIC PANEL
AST: 17 U/L (ref 0–37)
Albumin: 2.7 g/dL — ABNORMAL LOW (ref 3.5–5.2)
Alkaline Phosphatase: 69 U/L (ref 39–117)
BUN: 55 mg/dL — ABNORMAL HIGH (ref 6–23)
Potassium: 4.7 mEq/L (ref 3.5–5.1)
Sodium: 137 mEq/L (ref 135–145)
Total Protein: 6.1 g/dL (ref 6.0–8.3)

## 2011-01-17 LAB — PRO B NATRIURETIC PEPTIDE: Pro B Natriuretic peptide (BNP): 19731 pg/mL — ABNORMAL HIGH (ref 0–450)

## 2011-01-17 LAB — URINE MICROSCOPIC-ADD ON

## 2011-01-17 MED ORDER — IPRATROPIUM BROMIDE 0.02 % IN SOLN
0.5000 mg | Freq: Once | RESPIRATORY_TRACT | Status: AC
  Administered 2011-01-17: 0.5 mg via RESPIRATORY_TRACT
  Filled 2011-01-17: qty 2.5

## 2011-01-17 MED ORDER — SODIUM CHLORIDE 0.9 % IV BOLUS (SEPSIS)
250.0000 mL | Freq: Once | INTRAVENOUS | Status: AC
  Administered 2011-01-17: 1000 mL via INTRAVENOUS

## 2011-01-17 MED ORDER — ALBUTEROL SULFATE (5 MG/ML) 0.5% IN NEBU
2.5000 mg | INHALATION_SOLUTION | Freq: Once | RESPIRATORY_TRACT | Status: AC
  Administered 2011-01-17: 2.5 mg via RESPIRATORY_TRACT
  Filled 2011-01-17: qty 0.5

## 2011-01-17 MED ORDER — SODIUM CHLORIDE 0.9 % IV SOLN
Freq: Once | INTRAVENOUS | Status: AC
  Administered 2011-01-17: 21:00:00 via INTRAVENOUS

## 2011-01-17 NOTE — ED Provider Notes (Signed)
History   This chart was scribed for [employer]. Olin Hauser, MD by Kathreen Cornfield. The patient was seen in room APA10/APA10 and the patient's care was started at 8:26PM.    CSN: [id number]  Arrival date & time 01/17/11  2001   First MD Initiated Contact with Patient 01/17/11 2009      Chief Complaint  Patient presents with  . Shortness of Breath    (Consider location/radiation/quality/duration/timing/severity/associated sxs/prior treatment) HPI  [name] is a 78 y.o. female who presents to the Emergency Department complaining of moderate, constant SOB onset today with associated symptoms nausea, vomiting, loss of appetite. Pt is treating symptoms at home with 2 L of oxygen with moderate relief. Pt lives by herself. Pt is scheduled to have a nurse come and visit her home starting next week. Pt has an appointment with Dr. Moshe Cipro this Friday.  Pt denies chest pain, diarrhea, fever.  PCP is Dr. Buelah Manis.  Past Medical History  Diagnosis Date  . Anxiety   . Diabetes mellitus, type 2   . Hypertension   . Stroke     residual right side weakness and pain  . IBS (irritable bowel syndrome)     with costipation  . Overactive bladder   . Cataracts, both eyes   . Gynecologic cancer   . Hyperlipidemia   . CKD (chronic kidney disease), stage V   . Anemia in CKD (chronic kidney disease)   . SVT (supraventricular tachycardia)   . CHF (congestive heart failure)   . Arthritis     gout  . SOB (shortness of breath)     nml spirometry 09/05/06    Past Surgical History  Procedure Date  . Vesicovaginal fistula closure w/ tah   . Av fistula placement, brachiocephalic [id number]    left arm   by Dr. Bridgett Larsson  . Tee without cardioversion 2011    2/2 Strep Bovis infection    History reviewed. No pertinent family history.  History  Substance Use Topics  . Smoking status: Never Smoker   . Smokeless tobacco: Not on file  . Alcohol Use: No    OB History    Grav Para Term Preterm Abortions TAB  SAB Ect Mult Living                  Review of Systems  10 Systems reviewed and are negative for acute change except as noted in the HPI.   Allergies  Review of patient's allergies indicates no known allergies.  Home Medications   Current Outpatient Rx  Name Route Sig Dispense Refill  . ALBUTEROL SULFATE HFA 108 (90 BASE) MCG/ACT IN AERS Inhalation Inhale 2 puffs into the lungs every 2 (two) hours as needed for wheezing or shortness of breath (cough). 1 Inhaler 0  . AMLODIPINE BESYLATE 10 MG PO TABS Oral Take 10 mg by mouth every morning.      . ASPIRIN EC 81 MG PO TBEC Oral Take 81 mg by mouth every morning.      Marland Kitchen CALCITRIOL 0.25 MCG PO CAPS Oral Take 0.25 mcg by mouth every morning.     Marland Kitchen CARBOXYMETHYLCELLUL-GLYCERIN 0.5-0.9 % OP SOLN Ophthalmic Apply to eye. One drop in both eyes two times a day      . CARVEDILOL 25 MG PO TABS Oral Take 1 tablet (25 mg total) by mouth 2 (two) times daily with a meal. 60 tablet 1  . CEFUROXIME AXETIL 250 MG PO TABS Oral Take 1 tablet (250 mg total) by  mouth 2 (two) times daily. 14 tablet 0  . CITALOPRAM HYDROBROMIDE 20 MG PO TABS Oral Take 20 mg by mouth every morning.      Marland Kitchen CLONIDINE HCL 0.1 MG PO TABS Oral Take 1 tablet (0.1 mg total) by mouth 2 (two) times daily. 60 tablet 11  . GLUCERNA SHAKE PO LIQD Oral Take 237 mLs by mouth daily.      Marland Kitchen FLUTICASONE PROPIONATE 50 MCG/ACT NA SUSP Nasal 2 sprays by Nasal route daily.      . FUROSEMIDE 40 MG PO TABS Oral Take 1 tablet (40 mg total) by mouth 2 (two) times daily. 30 tablet 2  . GLUCOSE BLOOD VI STRP Other 1 each by Other route as needed. Use as instructed     . HYDRALAZINE HCL 100 MG PO TABS Oral Take 1 tablet (100 mg total) by mouth every 8 (eight) hours. 90 tablet 1  . INSULIN ASPART PROT & ASPART (70-30) 100 UNIT/ML [place] SUSP Subcutaneous Inject 5-8 Units into the skin 2 (two) times daily with a meal. 8 units every morning ,5units every evening Patient does not use insulin if her CBG is lower  than 150    . INSULIN PEN NEEDLE 29G X 12MM MISC Subcutaneous Inject into the skin. Use with insuline pen two times a day     . POLYSACCHARIDE IRON COMPLEX 150 MG PO CAPS Oral Take 150 mg by mouth every morning.     Glory Rosebush LANCETS MISC Does not apply by Does not apply route.      Marland Kitchen POTASSIUM CHLORIDE ER 10 MEQ PO TBCR Oral Take 1 tablet (10 mEq total) by mouth daily. Take with your evening dose of lasix 30 tablet 6  . TENEX 2 MG PO TABS  TAKE ONE TABLET BY MOUTH AT BEDTIME. 30 each 3  . ZOCOR 10 MG PO TABS  TAKE (1) TABLET BY MOUTH IN THE EVENING. 30 each 3    BP 168/67  Pulse 66  Temp(Src) 98.2 F (36.8 C) (Oral)  Resp 20  Ht 5\' 5"  (1.651 m)  SpO2 100%  LMP 12/24/2010  Physical Exam  Nursing note and vitals reviewed. Constitutional: She is oriented to person, place, and time. She appears well-developed and well-nourished. No distress.  HENT:  Head: Normocephalic and atraumatic.  Right Ear: External ear normal.  Left Ear: External ear normal.  Nose: Nose normal.  Mouth/Throat: Oropharynx is clear and moist.  Eyes: EOM are normal. Pupils are equal, round, and reactive to light.  Neck: Normal range of motion. Neck supple. No JVD present. Carotid bruit is not present. No tracheal deviation present.  Cardiovascular: Normal rate, regular rhythm and normal heart sounds.  Exam reveals no gallop and no friction rub.   No murmur heard. Pulmonary/Chest: Effort normal and breath sounds normal. No respiratory distress. She has no wheezes.  Abdominal: Soft. Bowel sounds are normal. She exhibits no distension.  Musculoskeletal: Normal range of motion. She exhibits edema (2+ Bilaterally.).  Neurological: She is alert and oriented to person, place, and time. No sensory deficit.  Skin: Skin is warm and dry.  Psychiatric: She has a normal mood and affect. Her behavior is normal.    ED Course  Procedures (including critical care time)  DIAGNOSTIC STUDIES: Oxygen Saturation is 100% on ,  normal by my interpretation.    COORDINATION OF CARE: Results for orders placed during the hospital encounter of 01/17/11  CBC      Component Value Range   WBC 6.0  4.0 - 10.5 (K/uL)   RBC 3.57 (*) 3.87 - 5.11 (MIL/uL)   Hemoglobin 8.4 (*) 12.0 - 15.0 (g/dL)   HCT 26.1 (*) 36.0 - 46.0 (%)   MCV 73.1 (*) 78.0 - 100.0 (fL)   MCH 23.5 (*) 26.0 - 34.0 (pg)   MCHC 32.2  30.0 - 36.0 (g/dL)   RDW 16.2 (*) 11.5 - 15.5 (%)   Platelets 162  150 - 400 (K/uL)  DIFFERENTIAL      Component Value Range   Neutrophils Relative 75  43 - 77 (%)   Lymphocytes Relative 22  12 - 46 (%)   Monocytes Relative 3  3 - 12 (%)   Eosinophils Relative 0  0 - 5 (%)   Basophils Relative 0  0 - 1 (%)   Band Neutrophils 0  0 - 10 (%)   Metamyelocytes Relative 0     Myelocytes 0     Promyelocytes Absolute 0     Blasts 0     nRBC 0  0 (/100 WBC)   Neutro Abs 4.5  1.7 - 7.7 (K/uL)   Lymphs Abs 1.3  0.7 - 4.0 (K/uL)   Monocytes Absolute 0.2  0.1 - 1.0 (K/uL)   Eosinophils Absolute 0.0  0.0 - 0.7 (K/uL)   Basophils Absolute 0.0  0.0 - 0.1 (K/uL)  COMPREHENSIVE METABOLIC PANEL      Component Value Range   Sodium 137  135 - 145 (mEq/L)   Potassium 4.7  3.5 - 5.1 (mEq/L)   Chloride 107  96 - 112 (mEq/L)   CO2 23  19 - 32 (mEq/L)   Glucose, Bld 107 (*) 70 - 99 (mg/dL)   BUN 55 (*) 6 - 23 (mg/dL)   Creatinine, Ser 2.36 (*) 0.50 - 1.10 (mg/dL)   Calcium 8.6  8.4 - 10.5 (mg/dL)   Total Protein 6.1  6.0 - 8.3 (g/dL)   Albumin 2.7 (*) 3.5 - 5.2 (g/dL)   AST 17  0 - 37 (U/L)   ALT 8  0 - 35 (U/L)   Alkaline Phosphatase 69  39 - 117 (U/L)   Total Bilirubin 0.5  0.3 - 1.2 (mg/dL)   GFR calc non Af Amer 18 (*) >90 (mL/min)   GFR calc Af Amer 21 (*) >90 (mL/min)  PRO B NATRIURETIC PEPTIDE      Component Value Range   Pro B Natriuretic peptide (BNP) 19731.0 (*) 0 - 450 (pg/mL)  TROPONIN I      Component Value Range   Troponin I <0.30  <0.30 (ng/mL)  URINALYSIS, ROUTINE W REFLEX MICROSCOPIC      Component Value  Range   Color, Urine YELLOW  YELLOW    APPearance CLEAR  CLEAR    Specific Gravity, Urine 1.015  1.005 - 1.030    pH 6.0  5.0 - 8.0    Glucose, UA NEGATIVE  NEGATIVE (mg/dL)   Hgb urine dipstick NEGATIVE  NEGATIVE    Bilirubin Urine NEGATIVE  NEGATIVE    Ketones, ur NEGATIVE  NEGATIVE (mg/dL)   Protein, ur 100 (*) NEGATIVE (mg/dL)   Urobilinogen, UA 0.2  0.0 - 1.0 (mg/dL)   Nitrite NEGATIVE  NEGATIVE    Leukocytes, UA NEGATIVE  NEGATIVE   URINE MICROSCOPIC-ADD ON      Component Value Range   WBC, UA 3-6  <3 (WBC/hpf)   RBC / HPF 3-6  <3 (RBC/hpf)   Bacteria, UA RARE  RARE    visit Dg Chest [facility] 1 [address]  01/17/2011  *RADIOLOGY REPORT*  Clinical Data: Short of breath.  Weakness.  PORTABLE CHEST - 1 VIEW  Comparison: 01/12/2011.  Findings: Cardiomegaly with pulmonary vascular congestion and interstitial pulmonary edema.  The chronic elevation of the left hemidiaphragm with left basilar atelectasis.  Tortuous thoracic aorta.  IMPRESSION: Cardiomegaly and interstitial pulmonary edema compatible with mild CHF.  Original Report Authenticated By: Dereck Ligas, M.D.     Date: 01/18/2011  2007  Rate:67  Rhythm: normal sinus rhythm  QRS Axis: normal  Intervals: normal  ST/T Wave abnormalities: nonspecific T wave changes  Conduction Disutrbances:first-degree A-V block   Narrative Interpretation: canot r/o septal infarct  Old EKG Reviewed: changes noted c/w 01/15/11 T wave inversion no longer present  MDM Patient with recent hospitalization for CHF. Since returning home has continued to feel weak and short of breath. Has been seen in the ER more recently for nausea and vomiting. Patient states she just feels "sick". Labs with evidence of CHF with elevated BNP and chest xray with evidence of cardiomegaly and pulmonary edema. Will arrange for admission to the hospital. Began IVF, analgesic, antiemetic. EKG unremarkable, cardiac troponin negative. Patient / Family / Caregiver informed of clinical  course, understand medical decision-making process, and agree with plan.Pt stable in ED with no significant deterioration in condition. The patient appears reasonably stabilized for admission considering the current resources, flow, and capabilities available in the ED at this time, and I doubt any other [facility] requiring further screening and/or treatment in the ED prior to admission.  8:31PM- EDP at bedside discusses treatment plan.  I personally performed the services described in this documentation, which was scribed in my presence. The recorded information has been reviewed and considered.   CRITICAL CARE Performed by: Prentiss Bells.   Total critical care time: 50 Critical care time was exclusive of separately billable procedures and treating other patients.  Critical care was necessary to treat or prevent imminent or life-threatening deterioration.  Critical care was time spent personally by me on the following activities: development of treatment plan with patient and/or surrogate as well as nursing, discussions with consultants, evaluation of patient's response to treatment, examination of patient, obtaining history from patient or surrogate, ordering and performing treatments and interventions, ordering and review of laboratory studies, ordering and review of radiographic studies, pulse oximetry and re-evaluation of patient's condition.  Gypsy Balsam. Olin Hauser, MD 01/18/11 [id number]

## 2011-01-17 NOTE — ED Notes (Signed)
Unable to draw blood from IV site

## 2011-01-17 NOTE — ED Notes (Signed)
Recent d/c from hospital and started on "fluid pill", SOB continues since admission

## 2011-01-18 ENCOUNTER — Inpatient Hospital Stay (HOSPITAL_COMMUNITY): Payer: Medicare HMO

## 2011-01-18 ENCOUNTER — Ambulatory Visit: Payer: Medicare Other | Admitting: Family Medicine

## 2011-01-18 LAB — CARDIAC PANEL(CRET KIN+CKTOT+MB+TROPI)
CK, MB: 3.6 ng/mL (ref 0.3–4.0)
Relative Index: INVALID (ref 0.0–2.5)
Relative Index: INVALID (ref 0.0–2.5)
Total CK: 82 U/L (ref 7–177)
Troponin I: <0.3 ng/mL (ref <0.30)

## 2011-01-18 LAB — GLUCOSE, CAPILLARY: Glucose-Capillary: 96 mg/dL (ref 70–99)

## 2011-01-18 MED ORDER — GLUCERNA SHAKE PO LIQD
237.0000 mL | Freq: Every day | ORAL | Status: DC
  Administered 2011-01-18 – 2011-01-20 (×3): 237 mL via ORAL

## 2011-01-18 MED ORDER — BIOTENE DRY MOUTH MT LIQD
15.0000 mL | Freq: Two times a day (BID) | OROMUCOSAL | Status: DC
  Administered 2011-01-18 – 2011-01-20 (×5): 15 mL via OROMUCOSAL

## 2011-01-18 MED ORDER — INSULIN ASPART PROT & ASPART (70-30 MIX) 100 UNIT/ML ~~LOC~~ SUSP
6.0000 [IU] | Freq: Two times a day (BID) | SUBCUTANEOUS | Status: DC
  Administered 2011-01-18: 6 [IU] via SUBCUTANEOUS

## 2011-01-18 MED ORDER — SIMVASTATIN 10 MG PO TABS
10.0000 mg | ORAL_TABLET | Freq: Every day | ORAL | Status: DC
  Administered 2011-01-18 – 2011-01-19 (×2): 10 mg via ORAL
  Filled 2011-01-18 (×3): qty 1

## 2011-01-18 MED ORDER — SODIUM CHLORIDE 0.9 % IJ SOLN
3.0000 mL | Freq: Two times a day (BID) | INTRAMUSCULAR | Status: DC
  Administered 2011-01-18 – 2011-01-19 (×4): 3 mL via INTRAVENOUS
  Filled 2011-01-18 (×3): qty 3

## 2011-01-18 MED ORDER — SODIUM CHLORIDE 0.9 % IV SOLN: 250.0000 mL | INTRAVENOUS | Status: DC | PRN

## 2011-01-18 MED ORDER — AMLODIPINE BESYLATE 5 MG PO TABS
10.0000 mg | ORAL_TABLET | Freq: Every day | ORAL | Status: DC
  Administered 2011-01-18 – 2011-01-20 (×3): 10 mg via ORAL
  Filled 2011-01-18 (×3): qty 2

## 2011-01-18 MED ORDER — CITALOPRAM HYDROBROMIDE 20 MG PO TABS
20.0000 mg | ORAL_TABLET | Freq: Every day | ORAL | Status: DC
  Administered 2011-01-18 – 2011-01-20 (×3): 20 mg via ORAL
  Filled 2011-01-18 (×3): qty 1

## 2011-01-18 MED ORDER — CEFUROXIME AXETIL 250 MG PO TABS
250.0000 mg | ORAL_TABLET | Freq: Two times a day (BID) | ORAL | Status: DC
  Administered 2011-01-18 – 2011-01-20 (×5): 250 mg via ORAL
  Filled 2011-01-18 (×5): qty 1

## 2011-01-18 MED ORDER — ASPIRIN EC 81 MG PO TBEC
81.0000 mg | DELAYED_RELEASE_TABLET | Freq: Every day | ORAL | Status: DC
  Administered 2011-01-18 – 2011-01-20 (×3): 81 mg via ORAL
  Filled 2011-01-18 (×3): qty 1

## 2011-01-18 MED ORDER — CARVEDILOL 12.5 MG PO TABS
25.0000 mg | ORAL_TABLET | Freq: Two times a day (BID) | ORAL | Status: DC
  Administered 2011-01-18 – 2011-01-20 (×5): 25 mg via ORAL
  Filled 2011-01-18 (×5): qty 2

## 2011-01-18 MED ORDER — POTASSIUM CHLORIDE ER 10 MEQ PO TBCR
10.0000 meq | EXTENDED_RELEASE_TABLET | Freq: Every day | ORAL | Status: DC
  Administered 2011-01-18 – 2011-01-20 (×3): 10 meq via ORAL
  Filled 2011-01-18 (×8): qty 1

## 2011-01-18 MED ORDER — FUROSEMIDE 10 MG/ML IJ SOLN
40.0000 mg | Freq: Two times a day (BID) | INTRAMUSCULAR | Status: DC
  Administered 2011-01-18 (×2): 40 mg via INTRAVENOUS
  Filled 2011-01-18 (×2): qty 4

## 2011-01-18 MED ORDER — INSULIN ASPART PROT & ASPART (70-30 MIX) 100 UNIT/ML ~~LOC~~ SUSP
5.0000 [IU] | Freq: Two times a day (BID) | SUBCUTANEOUS | Status: DC
  Administered 2011-01-18: 8 [IU] via SUBCUTANEOUS
  Filled 2011-01-18: qty 3

## 2011-01-18 MED ORDER — SODIUM CHLORIDE 0.9 % IJ SOLN: 3.0000 mL | INTRAMUSCULAR | Status: DC | PRN

## 2011-01-18 MED ORDER — FUROSEMIDE 10 MG/ML IJ SOLN
40.0000 mg | Freq: Once | INTRAMUSCULAR | Status: AC
Start: 2011-01-18 — End: 2011-01-18
  Administered 2011-01-18: 40 mg via INTRAVENOUS
  Filled 2011-01-18: qty 4

## 2011-01-18 MED ORDER — ALBUTEROL SULFATE HFA 108 (90 BASE) MCG/ACT IN AERS: 2.0000 | INHALATION_SPRAY | RESPIRATORY_TRACT | Status: DC | PRN

## 2011-01-18 MED ORDER — GUANFACINE HCL 1 MG PO TABS
2.0000 mg | ORAL_TABLET | Freq: Every day | ORAL | Status: DC
  Administered 2011-01-18 – 2011-01-19 (×2): 2 mg via ORAL
  Filled 2011-01-18 (×4): qty 1

## 2011-01-18 MED ORDER — CLONIDINE HCL 0.1 MG PO TABS
0.1000 mg | ORAL_TABLET | Freq: Two times a day (BID) | ORAL | Status: DC
  Administered 2011-01-18 (×2): 0.1 mg via ORAL
  Filled 2011-01-18 (×2): qty 1

## 2011-01-18 MED ORDER — CALCITRIOL 0.25 MCG PO CAPS
0.2500 ug | ORAL_CAPSULE | Freq: Every day | ORAL | Status: DC
  Administered 2011-01-18 – 2011-01-20 (×3): 0.25 ug via ORAL
  Filled 2011-01-18 (×5): qty 1

## 2011-01-18 MED ORDER — FLUTICASONE PROPIONATE 50 MCG/ACT NA SUSP
2.0000 | Freq: Every day | NASAL | Status: DC
  Administered 2011-01-18 – 2011-01-20 (×3): 2 via NASAL
  Filled 2011-01-18: qty 16

## 2011-01-18 MED ORDER — FUROSEMIDE 10 MG/ML IJ SOLN: 40.0000 mg | Freq: Every day | INTRAMUSCULAR | Status: DC

## 2011-01-18 MED ORDER — HYDRALAZINE HCL 25 MG PO TABS
100.0000 mg | ORAL_TABLET | Freq: Three times a day (TID) | ORAL | Status: DC
  Administered 2011-01-18 – 2011-01-20 (×7): 100 mg via ORAL
  Filled 2011-01-18 (×7): qty 4

## 2011-01-18 NOTE — Progress Notes (Signed)
*  PRELIMINARY RESULTS* Echocardiogram 2D Echocardiogram has been performed.  [name] 01/18/2011, 3:19 PM

## 2011-01-18 NOTE — Progress Notes (Signed)
UR Chart Review Completed  

## 2011-01-18 NOTE — H&P (Signed)
PCP:   Vic Blackbird, MD, MD   Chief Complaint:  Shortness of breath  HPI: 78 year old female with a history of congestive heart failure who presents emergency department with nausea vomiting and shortness of breath the last 2 days. She is a poor historian there is no known history of dementia. There is no family members with her right now. She says she's been short of breath she says is due to her heart failure. She says she takes her medications as prescribed. She was just discharged here last month and had increase in her Lasix dose at that time. She denies any fevers or chills. Denies any chest pain or abdominal pain.  Review of Systems:  Otherwise negative  Past Medical History: Past Medical History  Diagnosis Date  . Anxiety   . Diabetes mellitus, type 2   . Hypertension   . Stroke     residual right side weakness and pain  . IBS (irritable bowel syndrome)     with costipation  . Overactive bladder   . Cataracts, both eyes   . Gynecologic cancer   . Hyperlipidemia   . CKD (chronic kidney disease), stage V   . Anemia in CKD (chronic kidney disease)   . SVT (supraventricular tachycardia)   . CHF (congestive heart failure)   . Arthritis     gout  . SOB (shortness of breath)     nml spirometry 09/05/06   Past Surgical History  Procedure Date  . Vesicovaginal fistula closure w/ tah   . Av fistula placement, brachiocephalic [id number]    left arm   by Dr. Bridgett Larsson  . Tee without cardioversion 2011    2/2 Strep Bovis infection    Medications: Prior to Admission medications   Medication Sig Start Date End Date Taking? Authorizing Provider  albuterol (PROVENTIL HFA;VENTOLIN HFA) 108 (90 BASE) MCG/ACT inhaler Inhale 2 puffs into the lungs every 2 (two) hours as needed for wheezing or shortness of breath (cough). 12/27/10 12/27/11 Yes Babette Relic, MD  amLODipine (NORVASC) 10 MG tablet Take 10 mg by mouth every morning.   01/13/11 01/13/12 Yes Jehanzeb Memon  aspirin EC 81 MG  tablet Take 81 mg by mouth every morning.     Yes Historical Provider, MD  calcitRIOL (ROCALTROL) 0.25 MCG capsule Take 0.25 mcg by mouth every morning.    Yes Historical Provider, MD  Carboxymethylcellul-Glycerin (OPTIVE) 0.5-0.9 % SOLN Apply to eye. One drop in both eyes two times a day     Yes Historical Provider, MD  carvedilol (COREG) 25 MG tablet Take 1 tablet (25 mg total) by mouth 2 (two) times daily with a meal. 01/13/11 01/13/12 Yes Jehanzeb Memon  cefUROXime (CEFTIN) 250 MG tablet Take 1 tablet (250 mg total) by mouth 2 (two) times daily. 01/15/11 01/25/11 Yes Kathalene Frames, MD  citalopram (CELEXA) 20 MG tablet Take 20 mg by mouth every morning.     Yes Historical Provider, MD  cloNIDine (CATAPRES) 0.1 MG tablet Take 1 tablet (0.1 mg total) by mouth 2 (two) times daily. 01/13/11 01/13/12 Yes Jehanzeb Memon  feeding supplement (GLUCERNA SHAKE) LIQD Take 237 mLs by mouth daily.     Yes Historical Provider, MD  fluticasone (FLONASE) 50 MCG/ACT nasal spray 2 sprays by Nasal route daily.     Yes Historical Provider, MD  furosemide (LASIX) 40 MG tablet Take 1 tablet (40 mg total) by mouth 2 (two) times daily. 01/13/11  Yes Jehanzeb Memon  hydrALAZINE (APRESOLINE) 100 MG tablet Take 1  tablet (100 mg total) by mouth every 8 (eight) hours. 01/13/11 01/13/12 Yes Jehanzeb Memon  insulin aspart protamine-insulin aspart (NOVOLOG MIX 70/30 FLEXPEN) (70-30) 100 UNIT/ML injection Inject 5-8 Units into the skin 2 (two) times daily with a meal. 8 units every morning ,5units every evening Patient does not use insulin if her CBG is lower than 150   Yes Historical Provider, MD  iron polysaccharides (POLY-IRON 150) 150 MG capsule Take 150 mg by mouth every morning.  11/02/10  Yes Vic Blackbird, MD  potassium chloride (K-DUR) 10 MEQ tablet Take 1 tablet (10 mEq total) by mouth daily. Take with your evening dose of lasix 10/28/10 10/28/11 Yes Vic Blackbird, MD  TENEX 2 MG tablet TAKE ONE TABLET BY MOUTH AT BEDTIME. 12/04/10  Yes  Vic Blackbird, MD  ZOCOR 10 MG tablet TAKE (1) TABLET BY MOUTH IN THE EVENING. 01/02/11  Yes Vic Blackbird, MD  glucose blood test strip 1 each by Other route as needed. Use as instructed     Historical Provider, MD  Insulin Pen Needle 29G X 12MM MISC Inject into the skin. Use with insuline pen two times a day     Historical Provider, MD  Golf by Does not apply route.      Historical Provider, MD    Allergies:  No Known Allergies  Social History:  reports that she has never smoked. She does not have any smokeless tobacco history on file. She reports that she does not drink alcohol or use illicit drugs.  Family History: History reviewed. No pertinent family history.  Physical Exam: Filed Vitals:   01/17/11 2003 01/17/11 2004 01/17/11 2114  BP:  168/67   Pulse:  66   Temp:  98.2 F (36.8 C)   TempSrc:  Oral   Resp:  20   Height: 5\' 5"  (1.651 m)    SpO2:  100% 96%   BP 168/67  Pulse 66  Temp(Src) 98.2 F (36.8 C) (Oral)  Resp 20  Ht 5\' 5"  (1.651 m)  SpO2 96%  LMP 12/24/2010 General appearance: alert, cooperative and no distress Lungs: clear to auscultation bilaterally Heart: regular rate and rhythm, S1, S2 normal, no murmur, click, rub or gallop Abdomen: soft, non-tender; bowel sounds normal; no masses,  no organomegaly Extremities: edema 1-2 pitting edema ble Pulses: 2+ and symmetric Skin: Skin color, texture, turgor normal. No rashes or lesions Neurologic: Grossly normal    Labs on Admission:   [facility] 01/17/11 2054 01/15/11 1422  NA 137 140  K 4.7 4.5  CL 107 108  CO2 23 26  GLUCOSE 107* 145*  BUN 55* 66*  CREATININE 2.36* 2.74*  CALCIUM 8.6 9.2  MG -- --  PHOS -- --    Basename 01/17/11 2054 01/15/11 1422  AST 17 12  ALT 8 7  ALKPHOS 69 71  BILITOT 0.5 0.4  PROT 6.1 6.6  ALBUMIN 2.7* 2.8*    Basename 01/17/11 2054 01/15/11 1422  LIPASE 25 36  AMYLASE -- --    Basename 01/17/11 2054 01/15/11 1422  WBC 6.0 5.3    NEUTROABS 4.5 3.9  HGB 8.4* 9.4*  HCT 26.1* 29.2*  MCV 73.1* 73.9*  PLT 162 161    Basename 01/17/11 2054 01/15/11 1422  CKTOTAL -- --  CKMB -- --  CKMBINDEX -- --  TROPONINI <0.30 <0.30    Radiological Exams on Admission: Ct Abdomen Pelvis Wo Contrast  01/15/2011  *RADIOLOGY REPORT*  Clinical Data: Upper abdominal pain, gynecologic cancer status post  hysterectomy  CT ABDOMEN AND PELVIS WITHOUT CONTRAST  Technique:  Multidetector CT imaging of the abdomen and pelvis was performed following the standard protocol without intravenous contrast.  Comparison: 11/01/2009  Findings: Small bilateral pleural effusions.  Associated lower lobe opacities, possibly atelectasis.  No focal hepatic lesions.  Mild left pneumobilia, correlate for prior sphincterotomy.  Unenhanced spleen, pancreas, and adrenal glands are within normal limits.  Distended gallbladder with layering small gallstones (series 2/image 38), similar to 2011.  No associated inflammatory changes by CT.  No intrahepatic or extrahepatic ductal dilatation.  Bilateral renal cortical atrophy.  No renal calculi or hydronephrosis.  No evidence of bowel obstruction.  No colonic wall thickening or inflammatory changes.  Atherosclerotic calcifications of the abdominal aorta and branch vessels.  No abdominopelvic ascites.  No suspicious abdominopelvic lymphadenopathy.  Status post hysterectomy.  No adnexal masses.  Bladder is within normal limits.  Degenerative changes of the visualized thoracolumbar spine.  IMPRESSION: Distended gallbladder with cholelithiasis, similar to 2011.  No associated inflammatory changes by CT.  No evidence of bowel obstruction.  Small bilateral pleural effusions with associated lower lobe opacities, possibly atelectasis.  Original Report Authenticated By: Julian Hy, M.D.   Dg Chest 1 View  01/12/2011  *RADIOLOGY REPORT*  Clinical Data: Shortness of breath and weakness.  Question congestive heart failure.  CHEST - 1 VIEW   Comparison: 01/09/2011 and 01/05/2011.  Findings: 1320 hours.  There is stable cardiomegaly with chronic vascular congestion and bibasilar atelectasis.  As before, there may be small bilateral pleural effusions.  There is no focal airspace disease or definite change compared with the prior studies.  The patient likely has a chronic rotator cuff tear on the left.  The thoracic esophagus appears dilated proximally.  IMPRESSION: Stable cardiomegaly, vascular congestion and bibasilar atelectasis.  Original Report Authenticated By: Vivia Ewing, M.D.   Dg Chest 1 View  01/05/2011  *RADIOLOGY REPORT*  Clinical Data: Fever, altered level of consciousness for several days  CHEST - 1 VIEW  Comparison: Portable chest x-ray of 12/27/2010  Findings: Moderate cardiomegaly remains with perhaps mild pulmonary vascular congestion.  Slight elevation of the left hemidiaphragm is stable.  No acute bony abnormality is seen.  IMPRESSION: Stable moderate cardiomegaly with question of mild pulmonary vascular congestion.  Original Report Authenticated By: Joretta Bachelor, M.D.   Dg Hip Complete Right  01/06/2011  *RADIOLOGY REPORT*  Clinical Data: Fall, right-sided hip and ankle pain  RIGHT HIP - COMPLETE 2+ VIEW  Comparison: Plain films 06/15/2010  Findings: Hips are located.  Dedicated views of the right hip demonstrate no femoral neck fracture.  No pelvic fracture or sacral fracture.  Moderate volume stool in the ascending colon.  IMPRESSION: No evidence of pelvic fracture or left hip fracture.  Original Report Authenticated By: Suzy Bouchard, M.D.   Dg Ankle 2 Views Right  01/06/2011  *RADIOLOGY REPORT*  Clinical Data: Right-sided pain, ankle pain  RIGHT ANKLE - 2 VIEW  Comparison: None.  Findings: Ankle mortise intact.  The talar dome is normal.  No evidence of malleolar fracture.  Osteopenia.  IMPRESSION: No evidence of fracture on two views of the ankle.  Original Report Authenticated By: Suzy Bouchard, M.D.    Ct Head Wo Contrast  01/11/2011  *RADIOLOGY REPORT*  Clinical Data: Headache.  History of CVA with residual right-sided weakness status post fall 1 week ago.  Evaluate for progressive bleeding.  CT HEAD WITHOUT CONTRAST  Technique:  Contiguous axial images were obtained from the base  of the skull through the vertex without contrast.  Comparison: CT 01/05/2011.  MRI 01/06/2011.  Findings: A small amount of intraventricular hemorrhage was noted layering in the occipital horns on the prior examinations. This is not visible on the current examination.  There is no evidence of progressive hemorrhage or extra-axial fluid collection.  There is no evidence of acute infarction.  Atrophy and mild chronic small vessel extending changes are stable.  There is mild mucosal thickening in the right ethmoid and frontal sinuses.  The calvarium is intact.  IMPRESSION: Stable examination with atrophy and chronic small vessel ischemic changes.  No evidence of progressive hemorrhage or hydrocephalus.  Original Report Authenticated By: Vivia Ewing, M.D.   Ct Head Wo Contrast  01/05/2011  *RADIOLOGY REPORT*  Clinical Data:  Of 2 days ago with bruising in the right side of the neck, altered mental status, weakness  CT HEAD WITHOUT CONTRAST CT CERVICAL SPINE WITHOUT CONTRAST  Technique:  Multidetector CT imaging of the head and cervical spine was performed following the standard protocol without intravenous contrast.  Multiplanar CT image reconstructions of the cervical spine were also generated.  Comparison:  CT brain scan of 04/30/2010  CT HEAD  Findings: The ventricular system remains prominent, as are the cortical sulci and cerebellar folia, consistent with diffuse atrophy.  The septum is midline position mild small vessel ischemic changes present in the prep ventricle white matter.  No hemorrhage, mass lesion, or acute infarction is seen.  There is mucosal thickening in the left maxillary sinus but no air-fluid level is  seen.  Some opacification of ethmoid air cells is present and there is a small amount of fluid within the left partition of the sphenoid sinus as well the opacification of the right frontal sinus is noted.  On bone window images no acute calvarial abnormality is seen.  IMPRESSION:  1.  Stable atrophy and small vessel disease.  No acute intracranial abnormality. 2.  Diffuse sinus disease.  CT CERVICAL SPINE  Findings: The cervical vertebrae are slightly straightened in alignment.  There is diffuse degenerative change with some loss of joint space and spurring.  However no acute cervical spine fracture is seen.  The odontoid process is intact.  No prevertebral soft tissue swelling is noted.  There are the thyroid gland is slightly prominent and inhomogeneous with possible low attenuation nodule on the left.  IMPRESSION: Straightened alignment with diffuse degenerative change in osteopenia.  No acute abnormality.  Original Report Authenticated By: Joretta Bachelor, M.D.   Ct Cervical Spine Wo Contrast  01/05/2011  *RADIOLOGY REPORT*  Clinical Data:  Of 2 days ago with bruising in the right side of the neck, altered mental status, weakness  CT HEAD WITHOUT CONTRAST CT CERVICAL SPINE WITHOUT CONTRAST  Technique:  Multidetector CT imaging of the head and cervical spine was performed following the standard protocol without intravenous contrast.  Multiplanar CT image reconstructions of the cervical spine were also generated.  Comparison:  CT brain scan of 04/30/2010  CT HEAD  Findings: The ventricular system remains prominent, as are the cortical sulci and cerebellar folia, consistent with diffuse atrophy.  The septum is midline position mild small vessel ischemic changes present in the prep ventricle white matter.  No hemorrhage, mass lesion, or acute infarction is seen.  There is mucosal thickening in the left maxillary sinus but no air-fluid level is seen.  Some opacification of ethmoid air cells is present and there is  a small amount of fluid within  the left partition of the sphenoid sinus as well the opacification of the right frontal sinus is noted.  On bone window images no acute calvarial abnormality is seen.  IMPRESSION:  1.  Stable atrophy and small vessel disease.  No acute intracranial abnormality. 2.  Diffuse sinus disease.  CT CERVICAL SPINE  Findings: The cervical vertebrae are slightly straightened in alignment.  There is diffuse degenerative change with some loss of joint space and spurring.  However no acute cervical spine fracture is seen.  The odontoid process is intact.  No prevertebral soft tissue swelling is noted.  There are the thyroid gland is slightly prominent and inhomogeneous with possible low attenuation nodule on the left.  IMPRESSION: Straightened alignment with diffuse degenerative change in osteopenia.  No acute abnormality.  Original Report Authenticated By: Joretta Bachelor, M.D.   Mr Brain Wo Contrast  01/06/2011  *RADIOLOGY REPORT*  Clinical Data: Evaluated for possible stroke.  Altered mental status.  Recent fall  MRI HEAD WITHOUT CONTRAST  Technique:  Multiplanar, multiecho pulse sequences of the brain and surrounding structures were obtained according to standard protocol without intravenous contrast.  Comparison: CT head 01/05/2011.  Findings: The patient was moderately uncooperative and could not remain motionless for the study.  Images are suboptimal.  Small or subtle lesions could be overlooked.  Motion degraded diffusion weighted images show no acute stroke. There is advanced atrophy with chronic microvascular ischemic change. A small osteoma projects from the inner table of the calvarium in the right frontal bone.  There are small foci of intraventricular hemorrhage layering posteriorly on gradient sequence left greater than right (images 11 and 12 of series 10) reflecting previous closed head injury.  No parenchymal bleed is seen.  There is no midline shift.  Intracranial vasculature  is grossly patent. Pituitary and cerebellar tonsils grossly unremarkable.  The foci of hemorrhage are better seen on MR than on CT.  IMPRESSION: No acute stroke.  Subtle interventricular hemorrhage reflecting previous closed head injury.  Atrophy and small vessel disease.  Original Report Authenticated By: Staci Righter, M.D.   Dg Chest Port 1 View  01/17/2011  *RADIOLOGY REPORT*  Clinical Data: Short of breath.  Weakness.  PORTABLE CHEST - 1 VIEW  Comparison: 01/12/2011.  Findings: Cardiomegaly with pulmonary vascular congestion and interstitial pulmonary edema.  The chronic elevation of the left hemidiaphragm with left basilar atelectasis.  Tortuous thoracic aorta.  IMPRESSION: Cardiomegaly and interstitial pulmonary edema compatible with mild CHF.  Original Report Authenticated By: Dereck Ligas, M.D.   Dg Chest Port 1 View  01/09/2011  *RADIOLOGY REPORT*  Clinical Data: Question of CHF.  Shortness of breath.  PORTABLE CHEST - 1 VIEW  Comparison: 01/05/2011  Findings: Heart is enlarged.  The film is made with shallow lung inflation.  There is pulmonary vascular congestion but no overt edema.  There are no focal consolidations.  There is minimal left lower lobe subsegmental atelectasis.  Small bilateral pleural effusions are suspected.  IMPRESSION:  1.  Cardiomegaly and vascular congestion. 2.  Left base atelectasis. 3.  Small bilateral pleural effusions are suspected.  Original Report Authenticated By: Glenice Bow, M.D.   Dg Chest Portable 1 View  12/27/2010  *RADIOLOGY REPORT*  Clinical Data: Shortness of breath.  Cough  PORTABLE CHEST - 1 VIEW  Comparison: 07/10/2010  Findings: Heart size appears moderately enlarged.  There is moderate pulmonary venous congestion.  There is asymmetric elevation of the left hemidiaphragm.  Opacity within the left lung base  is noted consistent with pneumonia or atelectasis.  This appears similar to previous exam.  IMPRESSION: 1.  Cardiac enlargement and  pulmonary venous congestion. 2.  Asymmetric elevation of the left hemidiaphragm with left base opacity.  Original Report Authenticated By: Angelita Ingles, M.D.   Dg Knee Complete 4 Views Right  01/05/2011  *RADIOLOGY REPORT*  Clinical Data: [place] several days ago with pain  RIGHT KNEE - COMPLETE 4+ VIEW  Comparison: MR right knee of 02/25/2010  Findings: Tricompartmental degenerative joint disease is again noted with loss of joint space, sclerosis and spurring involving the compartment.  There is also chondrocalcinosis present which can be seen with CPPD or osteoarthritis.  No acute fracture is seen.  A small right knee joint effusion is present.  IMPRESSION: 1.  Significant tricompartmental degenerative joint disease with small joint effusion.  No fracture. 2.  Chondrocalcinosis.  Original Report Authenticated By: Joretta Bachelor, M.D.   Dg Abd Acute W/chest  01/15/2011  *RADIOLOGY REPORT*  Clinical Data: Abdominal pain  ACUTE ABDOMEN SERIES (ABDOMEN 2 VIEW & CHEST 1 VIEW)  Comparison: CT abdomen pelvis dated 11/02/2010  Findings: Cardiomegaly with pulmonary vascular congestion.  No frank interstitial edema. No pleural effusion or pneumothorax.  Nonobstructive bowel gas pattern.  Moderate stool throughout the colon.  No evidence of free air under the diaphragm on the upright view.  Degenerative changes of the visualized thoracolumbar spine.  Vascular calcifications.  IMPRESSION: Cardiomegaly with pulmonary vascular congestion.  No frank interstitial edema.  No evidence of small bowel obstruction or free air.  Moderate stool throughout the colon.  Original Report Authenticated By: Julian Hy, M.D.    Assessment/Plan Present on Admission:  78 year old female with mild CHF exacerbation  .ANEMIA OF RENAL FAILURE .CHRONIC KIDNEY DISEASE STAGE V .Chronic respiratory failure .CONGESTIVE HEART FAILURE UNSPECIFIED .Nausea & vomiting .DEPENDENT EDEMA, LEGS, BILATERAL .SOB (shortness of  breath) .DIABETES MELLITUS, CONTROLLED, WITH COMPLICATIONS .HYPERTENSION  Plan mild CHF exacerbation was in place her on IV Lasix gently diuresis her creatinine is actually better than her baseline. Monitor eyes and nose and daily weights closely. She's chronically on 2 L nasal cannula at home we'll continue this. We need to figure out what is going on with her at home whether not she really lives alone and she is capable of taking care of herself at home. Obtain physical therapy evaluation. Consider short-term rehabilitation versus placement.   DAVID,RACHAL A [id number] 01/18/2011, 2:18 AM

## 2011-01-18 NOTE — Progress Notes (Signed)
Subjective: This lady was just discharged from the hospital and presents again with congestive heart failure. She tells me that she has been taking her Lasix as prescribed, twice a day 40 mg.           Physical Exam: Blood pressure 193/79, pulse 68, temperature 98.4 F (36.9 C), temperature source Oral, resp. rate 20, height 5\' 5"  (1.651 m), last menstrual period 12/24/2010, SpO2 97.00%. She does not have any significant increased work of breathing. Her lung fields show reduced air entry in both bases more on the right than the left. Jugular venous pressure is elevated to 3 cm above the angle of [place]. Heart sounds are present and appear to be in sinus rhythm.   Investigations:  Recent Results (from the past 240 hour(s))  URINE CULTURE     Status: Normal (Preliminary result)   Collection Time   01/15/11  6:13 PM      Component Value Range Status Comment   Specimen Description URINE, CLEAN CATCH   Final    Special Requests NONE   Final    Setup Time [id number]   Final    Colony Count PENDING   Incomplete    Culture Culture reincubated for better growth   Final    Report Status PENDING   Incomplete      Basic Metabolic Panel:  [facility] 01/17/11 2054 01/15/11 1422  NA 137 140  K 4.7 4.5  CL 107 108  CO2 23 26  GLUCOSE 107* 145*  BUN 55* 66*  CREATININE 2.36* 2.74*  CALCIUM 8.6 9.2  MG -- --  PHOS -- --   Liver Function Tests:  [facility] 01/17/11 2054 01/15/11 1422  AST 17 12  ALT 8 7  ALKPHOS 69 71  BILITOT 0.5 0.4  PROT 6.1 6.6  ALBUMIN 2.7* 2.8*     CBC:  Basename 01/17/11 2054 01/15/11 1422  WBC 6.0 5.3  NEUTROABS 4.5 3.9  HGB 8.4* 9.4*  HCT 26.1* 29.2*  MCV 73.1* 73.9*  PLT 162 161    Dg Chest Port 1 View  01/17/2011  *RADIOLOGY REPORT*  Clinical Data: Short of breath.  Weakness.  PORTABLE CHEST - 1 VIEW  Comparison: 01/12/2011.  Findings: Cardiomegaly with pulmonary vascular congestion and interstitial pulmonary edema.  The chronic elevation of  the left hemidiaphragm with left basilar atelectasis.  Tortuous thoracic aorta.  IMPRESSION: Cardiomegaly and interstitial pulmonary edema compatible with mild CHF.  Original Report Authenticated By: Dereck Ligas, M.D.      Medications: I have reviewed the patient's current medications.  Impression: 1. Congestive heart failure, likely diastolic. 2. Chronic kidney disease. 3. Anemia of chronic kidney disease. 4. Significantly elevated hypertension. 5. Diabetes mellitus.     Plan: 1. Increase intravenous Lasix to 40 mg every 12 hours. 2. Obtain 2-D echocardiogram on this admission. 3. Physical therapy evaluation again. I would feel more comfortable if this lady goes to skilled nursing facility for rehabilitation prior to being discharged home.     LOS: 1 day   Doree Albee Pager [phone]  01/18/2011, 8:37 AM

## 2011-01-18 NOTE — Progress Notes (Signed)
Physical Therapy Evaluation Patient Details Name: [name] MRN: [id number] DOB: 1925-10-08 Today's Date: 01/18/2011  Problem List:  Patient Active Problem List  Diagnoses  . DIABETES MELLITUS, CONTROLLED, WITH COMPLICATIONS  . HYPERLIPIDEMIA  . ANEMIA OF RENAL FAILURE  . ANXIETY  . CATARACT NOS  . HYPERTENSION  . CONGESTIVE HEART FAILURE UNSPECIFIED  . ALLERGIC RHINITIS  . IBS  . CHRONIC KIDNEY DISEASE STAGE V  . ARTHRITIS  . DEPENDENT EDEMA, LEGS, BILATERAL  . DYSPNEA ON EXERTION  . CEREBROVASCULAR ACCIDENT, HX OF  . Arm bruise  . Colon polyps  . Insomnia  . Fall  . Constipation  . Altered level of consciousness  . Chronic respiratory failure  . Nausea & vomiting  . SOB (shortness of breath)    Past Medical History:  Past Medical History  Diagnosis Date  . Anxiety   . Diabetes mellitus, type 2   . Hypertension   . Stroke     residual right side weakness and pain  . IBS (irritable bowel syndrome)     with costipation  . Overactive bladder   . Cataracts, both eyes   . Gynecologic cancer   . Hyperlipidemia   . CKD (chronic kidney disease), stage V   . Anemia in CKD (chronic kidney disease)   . SVT (supraventricular tachycardia)   . CHF (congestive heart failure)   . Arthritis     gout  . SOB (shortness of breath)     nml spirometry 09/05/06   Past Surgical History:  Past Surgical History  Procedure Date  . Vesicovaginal fistula closure w/ tah   . Av fistula placement, brachiocephalic [id number]    left arm   by Dr. Bridgett Larsson  . Tee without cardioversion 2011    2/2 Strep Bovis infection    PT Assessment/Plan/Recommendation PT Assessment Clinical Impression Statement: pt is adamant about returning home at d/c...she becomes angry when it is suggested that she might need more help at home...functionally, pt is at baseline status...she does become dyspneic with gait (O2 at 4L/min) but gait and transfers are very stable...at this point, she probably  wouldn't qualify for SNF.Marland KitchenMarland KitchenI am recommendong HHPT to maximize home safety PT Recommendation/Assessment: All further PT needs can be met in the next venue of care PT Problem List: Decreased strength;Obesity PT Therapy Diagnosis : Difficulty walking PT Plan PT Treatment/Interventions: Therapeutic exercise;Balance training PT Recommendation Follow Up Recommendations: Home health PT Equipment Recommended: Defer to next venue PT Goals     PT Evaluation Precautions/Restrictions  Precautions Required Braces or Orthoses: No Restrictions Weight Bearing Restrictions: No Prior Functioning      Cognition Cognition Arousal/Alertness: Awake/alert Overall Cognitive Status: Appears within functional limits for tasks assessed Orientation Level: Oriented X4 Sensation/Coordination Sensation Light Touch: Appears Intact Stereognosis: Not tested Hot/Cold: Not tested Proprioception: Not tested Coordination Gross Motor Movements are Fluid and Coordinated: Yes Fine Motor Movements are Fluid and Coordinated: Yes Extremity Assessment RUE Assessment RUE Assessment: Within Functional Limits LUE Assessment LUE Assessment: Within Functional Limits Mobility (including Balance) Bed Mobility Supine to Sit: 6: Modified independent (Device/Increase time) Sitting - Scoot to Edge of Bed: 6: Modified independent (Device/Increase time) Transfers Sit to Stand: 6: Modified independent (Device/Increase time) Stand to Sit: 6: Modified independent (Device/Increase time) Ambulation/Gait Ambulation/Gait Assistance: 6: Modified independent (Device/Increase time) Ambulation Distance (Feet): 120 Feet Assistive device: Rolling walker Gait Pattern: Trunk flexed Stairs: No Wheelchair Mobility Wheelchair Mobility: No  Posture/Postural Control Posture/Postural Control: No significant limitations Exercise    End of Session  PT - End of Session Equipment Utilized During Treatment: Gait belt Activity Tolerance:  Patient tolerated treatment well Patient left: in bed;with call bell in reach;with bed alarm set Nurse Communication: Mobility status for transfers;Mobility status for ambulation General Behavior During Session: [facility] for tasks performed Cognition: [facility] for tasks performed  [name] 01/18/2011, 11:39 AM

## 2011-01-19 ENCOUNTER — Inpatient Hospital Stay (HOSPITAL_COMMUNITY): Payer: Medicare HMO

## 2011-01-19 LAB — COMPREHENSIVE METABOLIC PANEL
ALT: 8 U/L (ref 0–35)
Albumin: 2.5 g/dL — ABNORMAL LOW (ref 3.5–5.2)
Alkaline Phosphatase: 65 U/L (ref 39–117)
BUN: 52 mg/dL — ABNORMAL HIGH (ref 6–23)
Calcium: 8.9 mg/dL (ref 8.4–10.5)
Potassium: 4.8 mEq/L (ref 3.5–5.1)
Sodium: 142 mEq/L (ref 135–145)
Total Protein: 6 g/dL (ref 6.0–8.3)

## 2011-01-19 LAB — PRO B NATRIURETIC PEPTIDE: Pro B Natriuretic peptide (BNP): 15246 pg/mL — ABNORMAL HIGH (ref 0–450)

## 2011-01-19 LAB — CBC
HCT: 23.9 % — ABNORMAL LOW (ref 36.0–46.0)
HCT: 25.1 % — ABNORMAL LOW (ref 36.0–46.0)
Hemoglobin: 8.2 g/dL — ABNORMAL LOW (ref 12.0–15.0)
MCHC: 31.8 g/dL (ref 30.0–36.0)
MCHC: 32.7 g/dL (ref 30.0–36.0)
MCV: 73.1 fL — ABNORMAL LOW (ref 78.0–100.0)
RBC: 3.39 MIL/uL — ABNORMAL LOW (ref 3.87–5.11)
RDW: 16.2 % — ABNORMAL HIGH (ref 11.5–15.5)
WBC: 3.9 10*3/uL — ABNORMAL LOW (ref 4.0–10.5)

## 2011-01-19 LAB — GLUCOSE, CAPILLARY
Glucose-Capillary: 73 mg/dL (ref 70–99)
Glucose-Capillary: 170 mg/dL — ABNORMAL HIGH (ref 70–99)
Glucose-Capillary: 152 mg/dL — ABNORMAL HIGH (ref 70–99)

## 2011-01-19 LAB — BASIC METABOLIC PANEL
BUN: 53 mg/dL — ABNORMAL HIGH (ref 6–23)
CO2: 28 mEq/L (ref 19–32)
Chloride: 109 mEq/L (ref 96–112)
Creatinine, Ser: 2.61 mg/dL — ABNORMAL HIGH (ref 0.50–1.10)

## 2011-01-19 LAB — URINE CULTURE

## 2011-01-19 MED ORDER — INSULIN ASPART PROT & ASPART (70-30 MIX) 100 UNIT/ML ~~LOC~~ SUSP
4.0000 [IU] | Freq: Two times a day (BID) | SUBCUTANEOUS | Status: DC
  Administered 2011-01-19 – 2011-01-20 (×2): 4 [IU] via SUBCUTANEOUS

## 2011-01-19 MED ORDER — GUANFACINE HCL 1 MG PO TABS
ORAL_TABLET | ORAL | Status: AC
  Filled 2011-01-19: qty 2

## 2011-01-19 MED ORDER — FUROSEMIDE 40 MG PO TABS
80.0000 mg | ORAL_TABLET | Freq: Two times a day (BID) | ORAL | Status: DC
  Administered 2011-01-19 – 2011-01-20 (×3): 80 mg via ORAL
  Filled 2011-01-19 (×3): qty 2

## 2011-01-19 MED ORDER — CLONIDINE HCL 0.2 MG PO TABS
0.2000 mg | ORAL_TABLET | Freq: Two times a day (BID) | ORAL | Status: DC
  Administered 2011-01-19 – 2011-01-20 (×3): 0.2 mg via ORAL
  Filled 2011-01-19 (×3): qty 1

## 2011-01-19 NOTE — Progress Notes (Signed)
Subjective: This lady wants to go home. She does feel better. She is still requiring 4 L oxygen, however.           Physical Exam: Blood pressure 157/68, pulse 60, temperature 98.1 F (36.7 C), temperature source Oral, resp. rate 16, height 5\' 5"  (1.651 m), weight 88.225 kg (194 lb 8 oz), last menstrual period 12/24/2010, SpO2 100.00%. She does look clinically better. Lung fields are clear than yesterday. Jugular venous pressure not raised today. She is alert and orientated. She is frustrated that she still in the hospital.   Investigations:  Recent Results (from the past 240 hour(s))  URINE CULTURE     Status: Normal (Preliminary result)   Collection Time   01/15/11  6:13 PM      Component Value Range Status Comment   Specimen Description URINE, CLEAN CATCH   Final    Special Requests NONE   Final    Setup Time [id number]   Final    Colony Count 10,000 COLONIES/ML   Final    Culture [facility] POSITIVE COCCI   Final    Report Status PENDING   Incomplete      Basic Metabolic Panel:  [facility] 01/19/11 0619 01/18/11 2356  NA 142 141  K 4.8 4.4  CL 110 109  CO2 26 28  GLUCOSE 68* 58*  BUN 52* 53*  CREATININE 2.58* 2.61*  CALCIUM 8.9 8.7  MG -- --  PHOS -- --   Liver Function Tests:  [facility] 01/19/11 0619 01/17/11 2054  AST 17 17  ALT 8 8  ALKPHOS 65 69  BILITOT 0.4 0.5  PROT 6.0 6.1  ALBUMIN 2.5* 2.7*     CBC:  [facility] 01/19/11 0619 01/18/11 2356 01/17/11 2054  WBC 4.5 3.9* --  NEUTROABS -- -- 4.5  HGB 8.2* 7.6* --  HCT 25.1* 23.9* --  MCV 74.0* 73.1* --  PLT 172 155 --    Dg Chest Port 1 View  01/17/2011  *RADIOLOGY REPORT*  Clinical Data: Short of breath.  Weakness.  PORTABLE CHEST - 1 VIEW  Comparison: 01/12/2011.  Findings: Cardiomegaly with pulmonary vascular congestion and interstitial pulmonary edema.  The chronic elevation of the left hemidiaphragm with left basilar atelectasis.  Tortuous thoracic aorta.  IMPRESSION: Cardiomegaly and interstitial  pulmonary edema compatible with mild CHF.  Original Report Authenticated By: Dereck Ligas, M.D.      Medications: I have reviewed the patient's current medications.  Impression: 1. Congestive heart failure, likely diastolic, clinically improving. 2. Chronic kidney disease, stable. 3. Anemia of chronic kidney disease. 4. Hypertension, better control. 5. Diabetes mellitus.     Plan: 1. Discontinue IV Lasix and start Lasix orally 80 mg twice a day. 2. Increase clonidine to 0.2 mg twice a day. 3. Await echocardiogram result. 4. Possible discharge home tomorrow.     LOS: 2 days   Doree Albee Pager [phone]  01/19/2011, 8:13 AM

## 2011-01-20 DIAGNOSIS — D649 Anemia, unspecified: Secondary | ICD-10-CM

## 2011-01-20 DIAGNOSIS — I5031 Acute diastolic (congestive) heart failure: Secondary | ICD-10-CM

## 2011-01-20 DIAGNOSIS — I509 Heart failure, unspecified: Secondary | ICD-10-CM

## 2011-01-20 DIAGNOSIS — I129 Hypertensive chronic kidney disease with stage 1 through stage 4 chronic kidney disease, or unspecified chronic kidney disease: Secondary | ICD-10-CM

## 2011-01-20 LAB — CBC
MCH: 23.3 pg — ABNORMAL LOW (ref 26.0–34.0)
MCHC: 31.6 g/dL (ref 30.0–36.0)
MCV: 73.5 fL — ABNORMAL LOW (ref 78.0–100.0)
Platelets: 177 10*3/uL (ref 150–400)
RBC: 3.44 MIL/uL — ABNORMAL LOW (ref 3.87–5.11)
RDW: 16.4 % — ABNORMAL HIGH (ref 11.5–15.5)

## 2011-01-20 LAB — BASIC METABOLIC PANEL
CO2: 30 mEq/L (ref 19–32)
Calcium: 8.9 mg/dL (ref 8.4–10.5)
Creatinine, Ser: 2.65 mg/dL — ABNORMAL HIGH (ref 0.50–1.10)
GFR calc Af Amer: 18 mL/min — ABNORMAL LOW (ref >90)
GFR calc non Af Amer: 16 mL/min — ABNORMAL LOW (ref >90)
Sodium: 142 mEq/L (ref 135–145)

## 2011-01-20 LAB — GLUCOSE, CAPILLARY: Glucose-Capillary: 110 mg/dL — ABNORMAL HIGH (ref 70–99)

## 2011-01-20 LAB — PRO B NATRIURETIC PEPTIDE: Pro B Natriuretic peptide (BNP): 12865 pg/mL — ABNORMAL HIGH (ref 0–450)

## 2011-01-20 MED ORDER — CLONIDINE HCL 0.1 MG PO TABS: 0.2000 mg | ORAL_TABLET | Freq: Two times a day (BID) | ORAL | Status: DC

## 2011-01-20 MED ORDER — FUROSEMIDE 80 MG PO TABS: 80.0000 mg | ORAL_TABLET | Freq: Two times a day (BID) | ORAL | Status: DC

## 2011-01-20 MED ORDER — INSULIN ASPART PROT & ASPART (70-30 MIX) 100 UNIT/ML ~~LOC~~ SUSP: 4.0000 [IU] | Freq: Two times a day (BID) | SUBCUTANEOUS | Status: DC

## 2011-01-20 NOTE — Discharge Summary (Signed)
Physician Discharge Summary  Patient ID: [name] MRN: [id number] DOB/AGE: [birth year] 78 y.o. Primary Care Physician:[place], Lonell Grandchild, MD, MD Admit date: 01/17/2011 Discharge date: 01/20/2011    Discharge Diagnoses:  1. Diastolic congestive heart failure, improved. 2. Uncontrolled hypertension, improved. 3. Chronic kidney disease. 4. Type 2 diabetes mellitus. 5. Anemia of chronic kidney disease, stable.   Current Discharge Medication List    CONTINUE these medications which have CHANGED   Details  cloNIDine (CATAPRES) 0.1 MG tablet Take 2 tablets (0.2 mg total) by mouth 2 (two) times daily. Qty: 60 tablet, Refills: 0    furosemide (LASIX) 80 MG tablet Take 1 tablet (80 mg total) by mouth 2 (two) times daily. Qty: 60 tablet, Refills: 1    insulin aspart protamine-insulin aspart (NOVOLOG MIX 70/30 FLEXPEN) (70-30) 100 UNIT/ML injection Inject 4 Units into the skin 2 (two) times daily with a meal. Qty: 10 mL, Refills: 0      CONTINUE these medications which have NOT CHANGED   Details  albuterol (PROVENTIL HFA;VENTOLIN HFA) 108 (90 BASE) MCG/ACT inhaler Inhale 2 puffs into the lungs every 2 (two) hours as needed for wheezing or shortness of breath (cough). Qty: 1 Inhaler, Refills: 0    amLODipine (NORVASC) 10 MG tablet Take 10 mg by mouth every morning.      aspirin EC 81 MG tablet Take 81 mg by mouth every morning.      calcitRIOL (ROCALTROL) 0.25 MCG capsule Take 0.25 mcg by mouth every morning.     Carboxymethylcellul-Glycerin (OPTIVE) 0.5-0.9 % SOLN Apply to eye. One drop in both eyes two times a day      carvedilol (COREG) 25 MG tablet Take 1 tablet (25 mg total) by mouth 2 (two) times daily with a meal. Qty: 60 tablet, Refills: 1    cefUROXime (CEFTIN) 250 MG tablet Take 1 tablet (250 mg total) by mouth 2 (two) times daily. Qty: 14 tablet, Refills: 0    citalopram (CELEXA) 20 MG tablet Take 20 mg by mouth every morning.      feeding supplement (GLUCERNA SHAKE)  LIQD Take 237 mLs by mouth daily.      fluticasone (FLONASE) 50 MCG/ACT nasal spray 2 sprays by Nasal route daily.      hydrALAZINE (APRESOLINE) 100 MG tablet Take 1 tablet (100 mg total) by mouth every 8 (eight) hours. Qty: 90 tablet, Refills: 1    iron polysaccharides (POLY-IRON 150) 150 MG capsule Take 150 mg by mouth every morning.     potassium chloride (K-DUR) 10 MEQ tablet Take 1 tablet (10 mEq total) by mouth daily. Take with your evening dose of lasix Qty: 30 tablet, Refills: 6    TENEX 2 MG tablet TAKE ONE TABLET BY MOUTH AT BEDTIME. Qty: 30 each, Refills: 3    ZOCOR 10 MG tablet TAKE (1) TABLET BY MOUTH IN THE EVENING. Qty: 30 each, Refills: 3    glucose blood test strip 1 each by Other route as needed. Use as instructed     Insulin Pen Needle 29G X 12MM MISC Inject into the skin. Use with insuline pen two times a day     ONE TOUCH LANCETS MISC by Does not apply route.          Discharged Condition: Improved and stable.    Consults: None.  Significant Diagnostic Studies: Ct Abdomen Pelvis Wo Contrast  01/15/2011  *RADIOLOGY REPORT*  Clinical Data: Upper abdominal pain, gynecologic cancer status post hysterectomy  CT ABDOMEN AND PELVIS WITHOUT CONTRAST  Technique:  Multidetector CT imaging of the abdomen and pelvis was performed following the standard protocol without intravenous contrast.  Comparison: 11/01/2009  Findings: Small bilateral pleural effusions.  Associated lower lobe opacities, possibly atelectasis.  No focal hepatic lesions.  Mild left pneumobilia, correlate for prior sphincterotomy.  Unenhanced spleen, pancreas, and adrenal glands are within normal limits.  Distended gallbladder with layering small gallstones (series 2/image 38), similar to 2011.  No associated inflammatory changes by CT.  No intrahepatic or extrahepatic ductal dilatation.  Bilateral renal cortical atrophy.  No renal calculi or hydronephrosis.  No evidence of bowel obstruction.  No colonic  wall thickening or inflammatory changes.  Atherosclerotic calcifications of the abdominal aorta and branch vessels.  No abdominopelvic ascites.  No suspicious abdominopelvic lymphadenopathy.  Status post hysterectomy.  No adnexal masses.  Bladder is within normal limits.  Degenerative changes of the visualized thoracolumbar spine.  IMPRESSION: Distended gallbladder with cholelithiasis, similar to 2011.  No associated inflammatory changes by CT.  No evidence of bowel obstruction.  Small bilateral pleural effusions with associated lower lobe opacities, possibly atelectasis.  Original Report Authenticated By: Julian Hy, M.D.   Dg Chest 1 View  01/12/2011  *RADIOLOGY REPORT*  Clinical Data: Shortness of breath and weakness.  Question congestive heart failure.  CHEST - 1 VIEW  Comparison: 01/09/2011 and 01/05/2011.  Findings: 1320 hours.  There is stable cardiomegaly with chronic vascular congestion and bibasilar atelectasis.  As before, there may be small bilateral pleural effusions.  There is no focal airspace disease or definite change compared with the prior studies.  The patient likely has a chronic rotator cuff tear on the left.  The thoracic esophagus appears dilated proximally.  IMPRESSION: Stable cardiomegaly, vascular congestion and bibasilar atelectasis.  Original Report Authenticated By: Vivia Ewing, M.D.   Dg Chest 1 View  01/05/2011  *RADIOLOGY REPORT*  Clinical Data: Fever, altered level of consciousness for several days  CHEST - 1 VIEW  Comparison: Portable chest x-ray of 12/27/2010  Findings: Moderate cardiomegaly remains with perhaps mild pulmonary vascular congestion.  Slight elevation of the left hemidiaphragm is stable.  No acute bony abnormality is seen.  IMPRESSION: Stable moderate cardiomegaly with question of mild pulmonary vascular congestion.  Original Report Authenticated By: Joretta Bachelor, M.D.   Dg Chest 2 View  01/19/2011  *RADIOLOGY REPORT*  Clinical Data: History of  labored breathing.  History of congestive heart failure.  Follow-up.  CHEST - 2 VIEW  Comparison: 01/17/2011.  Findings: There is stable enlargement of the cardiac silhouette. Ectasia and tortuosity of thoracic aorta is seen.  There is moderate upper lobe vascular prominence consistent with mild pulmonary venous hypertension vascular congestion.  There is minimal right basilar atelectasis.  There is continued elevation of the left hemidiaphragm with moderate atelectasis in the left lung base.  No consolidation or definite pleural effusion is seen. There is osteopenic appearance of the bones.  IMPRESSION: Stable enlargement cardiac silhouette.  Moderate vascular congestion pattern.  Minimal right basilar atelectasis.  Continued elevation of the left hemidiaphragm with moderate atelectasis in the left lung base.  No consolidation or definite pleural effusion is seen.  Original Report Authenticated By: Delane Ginger, M.D.   Dg Hip Complete Right  01/06/2011  *RADIOLOGY REPORT*  Clinical Data: Fall, right-sided hip and ankle pain  RIGHT HIP - COMPLETE 2+ VIEW  Comparison: Plain films 06/15/2010  Findings: Hips are located.  Dedicated views of the right hip demonstrate no femoral neck fracture.  No pelvic fracture  or sacral fracture.  Moderate volume stool in the ascending colon.  IMPRESSION: No evidence of pelvic fracture or left hip fracture.  Original Report Authenticated By: Suzy Bouchard, M.D.   Dg Ankle 2 Views Right  01/06/2011  *RADIOLOGY REPORT*  Clinical Data: Right-sided pain, ankle pain  RIGHT ANKLE - 2 VIEW  Comparison: None.  Findings: Ankle mortise intact.  The talar dome is normal.  No evidence of malleolar fracture.  Osteopenia.  IMPRESSION: No evidence of fracture on two views of the ankle.  Original Report Authenticated By: Suzy Bouchard, M.D.   Ct Head Wo Contrast  01/11/2011  *RADIOLOGY REPORT*  Clinical Data: Headache.  History of CVA with residual right-sided weakness status post fall  1 week ago.  Evaluate for progressive bleeding.  CT HEAD WITHOUT CONTRAST  Technique:  Contiguous axial images were obtained from the base of the skull through the vertex without contrast.  Comparison: CT 01/05/2011.  MRI 01/06/2011.  Findings: A small amount of intraventricular hemorrhage was noted layering in the occipital horns on the prior examinations. This is not visible on the current examination.  There is no evidence of progressive hemorrhage or extra-axial fluid collection.  There is no evidence of acute infarction.  Atrophy and mild chronic small vessel extending changes are stable.  There is mild mucosal thickening in the right ethmoid and frontal sinuses.  The calvarium is intact.  IMPRESSION: Stable examination with atrophy and chronic small vessel ischemic changes.  No evidence of progressive hemorrhage or hydrocephalus.  Original Report Authenticated By: Vivia Ewing, M.D.   Ct Head Wo Contrast  01/05/2011  *RADIOLOGY REPORT*  Clinical Data:  Of 2 days ago with bruising in the right side of the neck, altered mental status, weakness  CT HEAD WITHOUT CONTRAST CT CERVICAL SPINE WITHOUT CONTRAST  Technique:  Multidetector CT imaging of the head and cervical spine was performed following the standard protocol without intravenous contrast.  Multiplanar CT image reconstructions of the cervical spine were also generated.  Comparison:  CT brain scan of 04/30/2010  CT HEAD  Findings: The ventricular system remains prominent, as are the cortical sulci and cerebellar folia, consistent with diffuse atrophy.  The septum is midline position mild small vessel ischemic changes present in the prep ventricle white matter.  No hemorrhage, mass lesion, or acute infarction is seen.  There is mucosal thickening in the left maxillary sinus but no air-fluid level is seen.  Some opacification of ethmoid air cells is present and there is a small amount of fluid within the left partition of the sphenoid sinus as well  the opacification of the right frontal sinus is noted.  On bone window images no acute calvarial abnormality is seen.  IMPRESSION:  1.  Stable atrophy and small vessel disease.  No acute intracranial abnormality. 2.  Diffuse sinus disease.  CT CERVICAL SPINE  Findings: The cervical vertebrae are slightly straightened in alignment.  There is diffuse degenerative change with some loss of joint space and spurring.  However no acute cervical spine fracture is seen.  The odontoid process is intact.  No prevertebral soft tissue swelling is noted.  There are the thyroid gland is slightly prominent and inhomogeneous with possible low attenuation nodule on the left.  IMPRESSION: Straightened alignment with diffuse degenerative change in osteopenia.  No acute abnormality.  Original Report Authenticated By: Joretta Bachelor, M.D.   Ct Cervical Spine Wo Contrast  01/05/2011  *RADIOLOGY REPORT*  Clinical Data:  Of 2 days ago with  bruising in the right side of the neck, altered mental status, weakness  CT HEAD WITHOUT CONTRAST CT CERVICAL SPINE WITHOUT CONTRAST  Technique:  Multidetector CT imaging of the head and cervical spine was performed following the standard protocol without intravenous contrast.  Multiplanar CT image reconstructions of the cervical spine were also generated.  Comparison:  CT brain scan of 04/30/2010  CT HEAD  Findings: The ventricular system remains prominent, as are the cortical sulci and cerebellar folia, consistent with diffuse atrophy.  The septum is midline position mild small vessel ischemic changes present in the prep ventricle white matter.  No hemorrhage, mass lesion, or acute infarction is seen.  There is mucosal thickening in the left maxillary sinus but no air-fluid level is seen.  Some opacification of ethmoid air cells is present and there is a small amount of fluid within the left partition of the sphenoid sinus as well the opacification of the right frontal sinus is noted.  On bone window  images no acute calvarial abnormality is seen.  IMPRESSION:  1.  Stable atrophy and small vessel disease.  No acute intracranial abnormality. 2.  Diffuse sinus disease.  CT CERVICAL SPINE  Findings: The cervical vertebrae are slightly straightened in alignment.  There is diffuse degenerative change with some loss of joint space and spurring.  However no acute cervical spine fracture is seen.  The odontoid process is intact.  No prevertebral soft tissue swelling is noted.  There are the thyroid gland is slightly prominent and inhomogeneous with possible low attenuation nodule on the left.  IMPRESSION: Straightened alignment with diffuse degenerative change in osteopenia.  No acute abnormality.  Original Report Authenticated By: Joretta Bachelor, M.D.   Mr Brain Wo Contrast  01/06/2011  *RADIOLOGY REPORT*  Clinical Data: Evaluated for possible stroke.  Altered mental status.  Recent fall  MRI HEAD WITHOUT CONTRAST  Technique:  Multiplanar, multiecho pulse sequences of the brain and surrounding structures were obtained according to standard protocol without intravenous contrast.  Comparison: CT head 01/05/2011.  Findings: The patient was moderately uncooperative and could not remain motionless for the study.  Images are suboptimal.  Small or subtle lesions could be overlooked.  Motion degraded diffusion weighted images show no acute stroke. There is advanced atrophy with chronic microvascular ischemic change. A small osteoma projects from the inner table of the calvarium in the right frontal bone.  There are small foci of intraventricular hemorrhage layering posteriorly on gradient sequence left greater than right (images 11 and 12 of series 10) reflecting previous closed head injury.  No parenchymal bleed is seen.  There is no midline shift.  Intracranial vasculature is grossly patent. Pituitary and cerebellar tonsils grossly unremarkable.  The foci of hemorrhage are better seen on MR than on CT.  IMPRESSION: No  acute stroke.  Subtle interventricular hemorrhage reflecting previous closed head injury.  Atrophy and small vessel disease.  Original Report Authenticated By: Staci Righter, M.D.   Dg Chest Port 1 View  01/17/2011  *RADIOLOGY REPORT*  Clinical Data: Short of breath.  Weakness.  PORTABLE CHEST - 1 VIEW  Comparison: 01/12/2011.  Findings: Cardiomegaly with pulmonary vascular congestion and interstitial pulmonary edema.  The chronic elevation of the left hemidiaphragm with left basilar atelectasis.  Tortuous thoracic aorta.  IMPRESSION: Cardiomegaly and interstitial pulmonary edema compatible with mild CHF.  Original Report Authenticated By: Dereck Ligas, M.D.   Dg Chest Port 1 View  01/09/2011  *RADIOLOGY REPORT*  Clinical Data: Question of CHF.  Shortness of breath.  PORTABLE CHEST - 1 VIEW  Comparison: 01/05/2011  Findings: Heart is enlarged.  The film is made with shallow lung inflation.  There is pulmonary vascular congestion but no overt edema.  There are no focal consolidations.  There is minimal left lower lobe subsegmental atelectasis.  Small bilateral pleural effusions are suspected.  IMPRESSION:  1.  Cardiomegaly and vascular congestion. 2.  Left base atelectasis. 3.  Small bilateral pleural effusions are suspected.  Original Report Authenticated By: Glenice Bow, M.D.   Dg Chest Portable 1 View  12/27/2010  *RADIOLOGY REPORT*  Clinical Data: Shortness of breath.  Cough  PORTABLE CHEST - 1 VIEW  Comparison: 07/10/2010  Findings: Heart size appears moderately enlarged.  There is moderate pulmonary venous congestion.  There is asymmetric elevation of the left hemidiaphragm.  Opacity within the left lung base is noted consistent with pneumonia or atelectasis.  This appears similar to previous exam.  IMPRESSION: 1.  Cardiac enlargement and pulmonary venous congestion. 2.  Asymmetric elevation of the left hemidiaphragm with left base opacity.  Original Report Authenticated By: Angelita Ingles, M.D.   Dg Knee Complete 4 Views Right  01/05/2011  *RADIOLOGY REPORT*  Clinical Data: [place] several days ago with pain  RIGHT KNEE - COMPLETE 4+ VIEW  Comparison: MR right knee of 02/25/2010  Findings: Tricompartmental degenerative joint disease is again noted with loss of joint space, sclerosis and spurring involving the compartment.  There is also chondrocalcinosis present which can be seen with CPPD or osteoarthritis.  No acute fracture is seen.  A small right knee joint effusion is present.  IMPRESSION: 1.  Significant tricompartmental degenerative joint disease with small joint effusion.  No fracture. 2.  Chondrocalcinosis.  Original Report Authenticated By: Joretta Bachelor, M.D.   Dg Abd Acute W/chest  01/15/2011  *RADIOLOGY REPORT*  Clinical Data: Abdominal pain  ACUTE ABDOMEN SERIES (ABDOMEN 2 VIEW & CHEST 1 VIEW)  Comparison: CT abdomen pelvis dated 11/02/2010  Findings: Cardiomegaly with pulmonary vascular congestion.  No frank interstitial edema. No pleural effusion or pneumothorax.  Nonobstructive bowel gas pattern.  Moderate stool throughout the colon.  No evidence of free air under the diaphragm on the upright view.  Degenerative changes of the visualized thoracolumbar spine.  Vascular calcifications.  IMPRESSION: Cardiomegaly with pulmonary vascular congestion.  No frank interstitial edema.  No evidence of small bowel obstruction or free air.  Moderate stool throughout the colon.  Original Report Authenticated By: Julian Hy, M.D.  Information      Name  MRN   Description     [name]  [id number]   78 year old Female              Result Narrative     *[facility]* [address], [place] [address] [id number]  ------------------------------------------------------------ Transthoracic Echocardiography  Patient: Emerey, Yueh MR #: [id number] Study Date: 01/18/2011 Gender: F Age: 66 Height: 165.1cm Weight: 81.6kg BSA: 1.28m^2 Pt.  Status: Room: A211  SONOGRAPHER [facility] ATTENDING [place], Colbey Wirtanen C REFERRING [place], [place] C PERFORMING [facility], [facility] ADMITTING Shanon Brow, Rachal cc:  ------------------------------------------------------------ LV EF: 60% - 65%  ------------------------------------------------------------ Indications: CHF - 428.0.  ------------------------------------------------------------ History: PMH: Dyspnea. PMH: Chronic respiratory failure, hyperlipidemia, bilateral lower leg edema Risk factors: Hypertension. Diabetes mellitus.  ------------------------------------------------------------ Study Conclusions  - Left ventricle: The cavity size was normal. Wall thickness was increased in a pattern of mild LVH. Systolic function was normal.  The estimated ejection fraction was in the range of 60% to 65%. Wall motion was normal; there were no regional wall motion abnormalities. Features are consistent with a pseudonormal left ventricular filling pattern, with concomitant abnormal relaxation and increased filling pressure (grade 2 diastolic dysfunction). Doppler parameters are consistent with elevated mean left atrial filling pressure. - Mitral valve: Mild regurgitation. - Left atrium: The atrium was mildly dilated. - Atrial septum: No defect or patent foramen ovale was identified. - Pulmonary arteries: Systolic pressure was mildly increased. PA peak pressure: 69mm Hg (S). Transthoracic echocardiography. M-mode, complete 2D, spectral Doppler, and color Doppler. Height: Height: 165.1cm. Height: 65in. Weight: Weight: 81.6kg. Weight: 179.6lb. Body mass index: BMI: 30kg/m^2. Body surface area: BSA: 1.37m^2. Patient status: Inpatient. Location: Bedside.       Lab Results: Basic Metabolic Panel:  Basename 01/20/11 0521 01/19/11 0619  NA 142 142  K 4.7 4.8  CL 108 110  CO2 30 26  GLUCOSE 117* 68*  BUN 53* 52*  CREATININE 2.65* 2.58*  CALCIUM  8.9 8.9  MG -- --  PHOS -- --   Liver Function Tests:  [facility] 01/19/11 0619 01/17/11 2054  AST 17 17  ALT 8 8  ALKPHOS 65 69  BILITOT 0.4 0.5  PROT 6.0 6.1  ALBUMIN 2.5* 2.7*     CBC:  Basename 01/20/11 0521 01/19/11 0619 01/17/11 2054  WBC 4.6 4.5 --  NEUTROABS -- -- 4.5  HGB 8.0* 8.2* --  HCT 25.3* 25.1* --  MCV 73.5* 74.0* --  PLT 177 172 --    Recent Results (from the past 240 hour(s))  URINE CULTURE     Status: Normal   Collection Time   01/15/11  2:54 PM      Component Value Range Status Comment   Specimen Description URINE, CLEAN CATCH   Final    Special Requests NONE   Final    Setup Time [id number]   Final    Colony Count 10,000 COLONIES/ML   Final    Culture ENTEROCOCCUS SPECIES   Final    Report Status 01/19/2011 FINAL   Final    Organism ID, Bacteria ENTEROCOCCUS SPECIES   Final      Hospital Course: This 78 year old lady was admitted once again in congestive heart failure. Her symptoms were dyspnea. It seems that her uncontrolled hypertension plays a significant part in her diastolic congestive heart failure. On admission on this occasion, she was diureses with intravenous Lasix. She also underwent an echocardiogram which confirmed the presence of normal ejection fraction, but confirmed also the presence of grade 2 diastolic dysfunction. Control of her hypertension has been always a challenge with this patient and on this occasion, with adjustments of medications, blood pressure has been the best control that I have seen. She feels well now and wants to go home. She has been evaluated by physical therapy, who does not feel she would be appropriate for a skilled nursing facility.  Discharge Exam: Blood pressure 147/57, pulse 55, temperature 98.1 F (36.7 C), temperature source Oral, resp. rate 18, height 5\' 5"  (1.651 m), weight 92.987 kg (205 lb), last menstrual period 12/24/2010, SpO2 100.00%. She looks systemically well. She is saturating 99% on 2 L  oxygen this morning. Lung fields are mostly clear with reduced air entry at the bases. Heart sounds are present and normal. Jugular venous pressure is not raised today. She is alert and orientated.  Disposition: Home. We will restart her home health services. Her Lasix has been increased to  80 mg twice a day and her clonidine was also been increased to 0.2 mg twice a day. I've given her new prescriptions for these. She will need to followup with her primary care physician soon.  Discharge Orders    Future Appointments: Provider: Department: Dept Phone: Center:   01/21/2011 10:45 AM [facility] [phone] None   01/22/2011 8:30 AM Vic Blackbird, MD [facility]   02/04/2011 10:45 AM [facility] [phone] None   03/25/2011 10:45 AM Vic Blackbird, MD [facility] [phone] [facility]     Future Orders Please Complete By Expires   Diet - low sodium heart healthy      Increase activity slowly         Follow-up Information    Follow up with Vic Blackbird, MD .         SignedDoree Albee Pager [phone]  01/20/2011, 8:41 AM

## 2011-01-20 NOTE — Progress Notes (Signed)
Patient d/c home with [facility] following patient  Left floor via wheelchair accompanied by staff No c/o pain at d/c Verbalized understanding of d/c instruction, RX's, and  follow up appt. Madia Carvell [employer]

## 2011-01-20 NOTE — Progress Notes (Signed)
  CARE MANAGEMENT NOTE 01/20/2011  Patient:  [name]   Account Number:  [id number]  Date Initiated:  01/19/2011  Documentation initiated by:  Vladimir Creeks  Subjective/Objective Assessment:   Admitted with migraine, with atypical, unilateral weakness, R/O CVA. Pt is from home, lives with a friend. She is independent and will return home     Action/Plan:   No needs identified   Anticipated DC Date:  01/20/2011   Anticipated DC Plan:  [place]  CM consult      Choice offered to / List presented to:             Status of service:  In process, will continue to follow Medicare Important Message given?   (If response is "NO", the following Medicare IM given date fields will be blank) Date Medicare IM given:   Date Additional Medicare IM given:    Discharge Disposition:  HOME/SELF CARE  Per UR Regulation:  Reviewed for med. necessity/level of care/duration of stay  Comments:  01/19/11 [place]

## 2011-01-21 ENCOUNTER — Other Ambulatory Visit: Payer: Self-pay

## 2011-01-21 ENCOUNTER — Inpatient Hospital Stay (HOSPITAL_COMMUNITY)
Admission: EM | Admit: 2011-01-21 | Discharge: 2011-01-26 | DRG: 292 | Disposition: A | Discharge status: Home Health | Payer: Medicare HMO | Attending: Internal Medicine | Admitting: Internal Medicine

## 2011-01-21 ENCOUNTER — Ambulatory Visit (HOSPITAL_COMMUNITY): Payer: Medicare Other

## 2011-01-21 ENCOUNTER — Encounter (HOSPITAL_COMMUNITY): Payer: Self-pay | Admitting: Emergency Medicine

## 2011-01-21 ENCOUNTER — Emergency Department (HOSPITAL_COMMUNITY): Payer: Medicare HMO

## 2011-01-21 DIAGNOSIS — E1129 Type 2 diabetes mellitus with other diabetic kidney complication: Secondary | ICD-10-CM | POA: Diagnosis present

## 2011-01-21 DIAGNOSIS — M129 Arthropathy, unspecified: Secondary | ICD-10-CM

## 2011-01-21 DIAGNOSIS — E46 Unspecified protein-calorie malnutrition: Secondary | ICD-10-CM | POA: Diagnosis present

## 2011-01-21 DIAGNOSIS — N184 Chronic kidney disease, stage 4 (severe): Secondary | ICD-10-CM

## 2011-01-21 DIAGNOSIS — R609 Edema, unspecified: Secondary | ICD-10-CM

## 2011-01-21 DIAGNOSIS — F419 Anxiety disorder, unspecified: Secondary | ICD-10-CM | POA: Diagnosis present

## 2011-01-21 DIAGNOSIS — I509 Heart failure, unspecified: Secondary | ICD-10-CM | POA: Diagnosis present

## 2011-01-21 DIAGNOSIS — I5033 Acute on chronic diastolic (congestive) heart failure: Principal | ICD-10-CM | POA: Diagnosis present

## 2011-01-21 DIAGNOSIS — F411 Generalized anxiety disorder: Secondary | ICD-10-CM

## 2011-01-21 DIAGNOSIS — I69998 Other sequelae following unspecified cerebrovascular disease: Secondary | ICD-10-CM

## 2011-01-21 DIAGNOSIS — H269 Unspecified cataract: Secondary | ICD-10-CM

## 2011-01-21 DIAGNOSIS — N189 Chronic kidney disease, unspecified: Secondary | ICD-10-CM

## 2011-01-21 DIAGNOSIS — R112 Nausea with vomiting, unspecified: Secondary | ICD-10-CM

## 2011-01-21 DIAGNOSIS — R5381 Other malaise: Secondary | ICD-10-CM | POA: Diagnosis present

## 2011-01-21 DIAGNOSIS — N185 Chronic kidney disease, stage 5: Secondary | ICD-10-CM

## 2011-01-21 DIAGNOSIS — I120 Hypertensive chronic kidney disease with stage 5 chronic kidney disease or end stage renal disease: Secondary | ICD-10-CM | POA: Diagnosis present

## 2011-01-21 DIAGNOSIS — E118 Type 2 diabetes mellitus with unspecified complications: Secondary | ICD-10-CM

## 2011-01-21 DIAGNOSIS — Z8679 Personal history of other diseases of the circulatory system: Secondary | ICD-10-CM

## 2011-01-21 DIAGNOSIS — R404 Transient alteration of awareness: Secondary | ICD-10-CM

## 2011-01-21 DIAGNOSIS — I10 Essential (primary) hypertension: Secondary | ICD-10-CM

## 2011-01-21 DIAGNOSIS — S40029A Contusion of unspecified upper arm, initial encounter: Secondary | ICD-10-CM

## 2011-01-21 DIAGNOSIS — R4182 Altered mental status, unspecified: Secondary | ICD-10-CM

## 2011-01-21 DIAGNOSIS — E785 Hyperlipidemia, unspecified: Secondary | ICD-10-CM | POA: Diagnosis present

## 2011-01-21 DIAGNOSIS — K635 Polyp of colon: Secondary | ICD-10-CM

## 2011-01-21 DIAGNOSIS — N039 Chronic nephritic syndrome with unspecified morphologic changes: Secondary | ICD-10-CM | POA: Diagnosis present

## 2011-01-21 DIAGNOSIS — D631 Anemia in chronic kidney disease: Secondary | ICD-10-CM | POA: Diagnosis present

## 2011-01-21 DIAGNOSIS — R5383 Other fatigue: Secondary | ICD-10-CM | POA: Diagnosis present

## 2011-01-21 DIAGNOSIS — J9610 Chronic respiratory failure, unspecified whether with hypoxia or hypercapnia: Secondary | ICD-10-CM | POA: Diagnosis present

## 2011-01-21 DIAGNOSIS — R0989 Other specified symptoms and signs involving the circulatory and respiratory systems: Secondary | ICD-10-CM

## 2011-01-21 DIAGNOSIS — K589 Irritable bowel syndrome without diarrhea: Secondary | ICD-10-CM

## 2011-01-21 DIAGNOSIS — W19XXXA Unspecified fall, initial encounter: Secondary | ICD-10-CM

## 2011-01-21 DIAGNOSIS — E119 Type 2 diabetes mellitus without complications: Secondary | ICD-10-CM

## 2011-01-21 DIAGNOSIS — G4700 Insomnia, unspecified: Secondary | ICD-10-CM

## 2011-01-21 DIAGNOSIS — R0602 Shortness of breath: Secondary | ICD-10-CM

## 2011-01-21 DIAGNOSIS — E669 Obesity, unspecified: Secondary | ICD-10-CM | POA: Diagnosis present

## 2011-01-21 DIAGNOSIS — J309 Allergic rhinitis, unspecified: Secondary | ICD-10-CM

## 2011-01-21 LAB — COMPREHENSIVE METABOLIC PANEL
AST: 14 U/L (ref 0–37)
Albumin: 3.1 g/dL — ABNORMAL LOW (ref 3.5–5.2)
Alkaline Phosphatase: 85 U/L (ref 39–117)
BUN: 50 mg/dL — ABNORMAL HIGH (ref 6–23)
CO2: 26 mEq/L (ref 19–32)
Chloride: 104 mEq/L (ref 96–112)
Creatinine, Ser: 2.62 mg/dL — ABNORMAL HIGH (ref 0.50–1.10)
GFR calc non Af Amer: 16 mL/min — ABNORMAL LOW (ref >90)
Potassium: 4.5 mEq/L (ref 3.5–5.1)
Total Bilirubin: 0.4 mg/dL (ref 0.3–1.2)

## 2011-01-21 LAB — CARDIAC PANEL(CRET KIN+CKTOT+MB+TROPI)
CK, MB: 3.1 ng/mL (ref 0.3–4.0)
CK, MB: 3.1 ng/mL (ref 0.3–4.0)
Relative Index: INVALID (ref 0.0–2.5)
Total CK: 80 U/L (ref 7–177)
Total CK: 79 U/L (ref 7–177)

## 2011-01-21 LAB — DIFFERENTIAL
Basophils Relative: 0 % (ref 0–1)
Lymphocytes Relative: 14 % (ref 12–46)
Monocytes Absolute: 0.5 10*3/uL (ref 0.1–1.0)
Monocytes Relative: 7 % (ref 3–12)
Neutro Abs: 5.1 10*3/uL (ref 1.7–7.7)
Neutrophils Relative %: 77 % (ref 43–77)

## 2011-01-21 LAB — PRO B NATRIURETIC PEPTIDE: Pro B Natriuretic peptide (BNP): 15325 pg/mL — ABNORMAL HIGH (ref 0–450)

## 2011-01-21 LAB — CBC
HCT: 27.9 % — ABNORMAL LOW (ref 36.0–46.0)
Hemoglobin: 8.8 g/dL — ABNORMAL LOW (ref 12.0–15.0)
RBC: 3.83 MIL/uL — ABNORMAL LOW (ref 3.87–5.11)
WBC: 6.6 10*3/uL (ref 4.0–10.5)

## 2011-01-21 MED ORDER — FUROSEMIDE 10 MG/ML IJ SOLN
80.0000 mg | Freq: Once | INTRAMUSCULAR | Status: AC
  Administered 2011-01-21: 80 mg via INTRAVENOUS
  Filled 2011-01-21 (×2): qty 8

## 2011-01-21 MED ORDER — CLONIDINE HCL 0.1 MG PO TABS
0.1000 mg | ORAL_TABLET | Freq: Two times a day (BID) | ORAL | Status: DC
  Administered 2011-01-21: 0.1 mg via ORAL
  Filled 2011-01-21 (×3): qty 1

## 2011-01-21 MED ORDER — SODIUM CHLORIDE 0.9 % IJ SOLN
3.0000 mL | Freq: Two times a day (BID) | INTRAMUSCULAR | Status: DC
  Administered 2011-01-22 – 2011-01-25 (×8): 3 mL via INTRAVENOUS

## 2011-01-21 MED ORDER — FLUTICASONE PROPIONATE 50 MCG/ACT NA SUSP
2.0000 | Freq: Every day | NASAL | Status: DC
  Administered 2011-01-22 – 2011-01-26 (×4): 2 via NASAL
  Filled 2011-01-21: qty 16

## 2011-01-21 MED ORDER — CITALOPRAM HYDROBROMIDE 20 MG PO TABS
20.0000 mg | ORAL_TABLET | Freq: Every day | ORAL | Status: DC
  Administered 2011-01-22 – 2011-01-26 (×5): 20 mg via ORAL
  Filled 2011-01-21 (×5): qty 1

## 2011-01-21 MED ORDER — CLONIDINE HCL 0.2 MG PO TABS
0.2000 mg | ORAL_TABLET | Freq: Two times a day (BID) | ORAL | Status: DC
  Administered 2011-01-21: 0.1 mg via ORAL
  Administered 2011-01-22 – 2011-01-24 (×5): 0.2 mg via ORAL
  Filled 2011-01-21 (×4): qty 1
  Filled 2011-01-21: qty 2
  Filled 2011-01-21 (×2): qty 1

## 2011-01-21 MED ORDER — POLYSACCHARIDE IRON 150 MG PO CAPS
150.0000 mg | ORAL_CAPSULE | Freq: Every day | ORAL | Status: DC
  Administered 2011-01-22: 150 mg via ORAL
  Filled 2011-01-21: qty 1

## 2011-01-21 MED ORDER — ACETAMINOPHEN 325 MG PO TABS: 650.0000 mg | ORAL_TABLET | ORAL | Status: DC | PRN

## 2011-01-21 MED ORDER — HYDRALAZINE HCL 50 MG PO TABS
100.0000 mg | ORAL_TABLET | Freq: Three times a day (TID) | ORAL | Status: DC
  Administered 2011-01-21 – 2011-01-26 (×16): 100 mg via ORAL
  Filled 2011-01-21 (×17): qty 2

## 2011-01-21 MED ORDER — CARVEDILOL 25 MG PO TABS
25.0000 mg | ORAL_TABLET | Freq: Two times a day (BID) | ORAL | Status: DC
  Administered 2011-01-22 – 2011-01-26 (×9): 25 mg via ORAL
  Filled 2011-01-21 (×10): qty 1

## 2011-01-21 MED ORDER — FUROSEMIDE 10 MG/ML IJ SOLN
40.0000 mg | Freq: Once | INTRAMUSCULAR | Status: AC
  Administered 2011-01-21: 40 mg via INTRAVENOUS
  Filled 2011-01-21: qty 4

## 2011-01-21 MED ORDER — FUROSEMIDE 10 MG/ML IJ SOLN
40.0000 mg | Freq: Two times a day (BID) | INTRAMUSCULAR | Status: DC
  Filled 2011-01-21 (×3): qty 4

## 2011-01-21 MED ORDER — AMLODIPINE BESYLATE 10 MG PO TABS
10.0000 mg | ORAL_TABLET | Freq: Every day | ORAL | Status: DC
Start: 2011-01-22 — End: 2011-01-22
  Filled 2011-01-21: qty 1

## 2011-01-21 MED ORDER — INSULIN ASPART PROT & ASPART (70-30 MIX) 100 UNIT/ML ~~LOC~~ SUSP
5.0000 [IU] | Freq: Two times a day (BID) | SUBCUTANEOUS | Status: DC
  Filled 2011-01-21: qty 3

## 2011-01-21 MED ORDER — CALCITRIOL 0.25 MCG PO CAPS
0.2500 ug | ORAL_CAPSULE | Freq: Every day | ORAL | Status: DC
  Administered 2011-01-22 – 2011-01-26 (×5): 0.25 ug via ORAL
  Filled 2011-01-21 (×5): qty 1

## 2011-01-21 MED ORDER — ONDANSETRON HCL 4 MG/2ML IJ SOLN: 4.0000 mg | Freq: Four times a day (QID) | INTRAMUSCULAR | Status: DC | PRN

## 2011-01-21 MED ORDER — HYDRALAZINE HCL 20 MG/ML IJ SOLN
10.0000 mg | Freq: Four times a day (QID) | INTRAMUSCULAR | Status: DC | PRN
  Administered 2011-01-21 – 2011-01-22 (×2): 10 mg via INTRAVENOUS
  Filled 2011-01-21: qty 0.5
  Filled 2011-01-21: qty 1

## 2011-01-21 MED ORDER — POTASSIUM CHLORIDE ER 10 MEQ PO TBCR
10.0000 meq | EXTENDED_RELEASE_TABLET | Freq: Every day | ORAL | Status: DC
  Administered 2011-01-22 – 2011-01-26 (×5): 10 meq via ORAL
  Filled 2011-01-21 (×5): qty 1

## 2011-01-21 MED ORDER — SODIUM CHLORIDE 0.9 % IV SOLN: 250.0000 mL | INTRAVENOUS | Status: DC | PRN

## 2011-01-21 MED ORDER — SODIUM CHLORIDE 0.9 % IJ SOLN: 3.0000 mL | INTRAMUSCULAR | Status: DC | PRN

## 2011-01-21 MED ORDER — SIMVASTATIN 10 MG PO TABS
10.0000 mg | ORAL_TABLET | Freq: Every day | ORAL | Status: DC
  Administered 2011-01-22 – 2011-01-25 (×4): 10 mg via ORAL
  Filled 2011-01-21 (×5): qty 1

## 2011-01-21 NOTE — ED Notes (Signed)
Patient transported to X-ray 

## 2011-01-21 NOTE — ED Notes (Signed)
-   pt d/c'd from [facility] today for a dx of chf / htn.   Today c/o chest pain to (L) chest and non-radiating and 6 / 1. On scale.         Pt on 2L/[place] .         Legs are taut.      Lungs are CTA.

## 2011-01-21 NOTE — ED Notes (Signed)
Patient is resting comfortably. 

## 2011-01-21 NOTE — ED Notes (Signed)
Vital signs stable. 

## 2011-01-21 NOTE — ED Notes (Signed)
MD at bedside. 

## 2011-01-21 NOTE — ED Provider Notes (Signed)
History     CSN: [id number]  Arrival date & time 01/21/11  1445   First MD Initiated Contact with Patient 01/21/11 1526      Chief Complaint  Patient presents with  . Chest Pain    (Consider location/radiation/quality/duration/timing/severity/associated sxs/prior treatment) HPI Comments: Has history of CHF, COPD.  Was recently admitted at [facility] for this.  Was discharged yesterday.  Presents today complaining of worsening of shortness of breath, tightness in chest.    Patient is a 78 y.o. female presenting with chest pain. The history is provided by the patient.  Chest Pain The chest pain began 1 - 2 weeks ago. Chest pain occurs constantly. The chest pain is worsening. The pain is associated with breathing and exertion. At its most intense, the pain is at 3/10. The severity of the pain is moderate. The quality of the pain is described as heavy. The pain does not radiate. Chest pain is worsened by deep breathing. Primary symptoms include fatigue and shortness of breath. Pertinent negatives for primary symptoms include no fever, no wheezing, no palpitations, no nausea and no vomiting.     Past Medical History  Diagnosis Date  . Anxiety   . Diabetes mellitus, type 2   . Hypertension   . Stroke     residual right side weakness and pain  . IBS (irritable bowel syndrome)     with costipation  . Overactive bladder   . Cataracts, both eyes   . Gynecologic cancer   . Hyperlipidemia   . CKD (chronic kidney disease), stage V   . Anemia in CKD (chronic kidney disease)   . SVT (supraventricular tachycardia)   . CHF (congestive heart failure)   . Arthritis     gout  . SOB (shortness of breath)     nml spirometry 09/05/06    Past Surgical History  Procedure Date  . Vesicovaginal fistula closure w/ tah   . Av fistula placement, brachiocephalic [id number]    left arm   by Dr. Bridgett Larsson  . Tee without cardioversion 2011    2/2 Strep Bovis infection    History reviewed. No pertinent family  history.  History  Substance Use Topics  . Smoking status: Never Smoker   . Smokeless tobacco: Not on file  . Alcohol Use: No    OB History    Grav Para Term Preterm Abortions TAB SAB Ect Mult Living                  Review of Systems  Constitutional: Positive for fatigue. Negative for fever.  Respiratory: Positive for shortness of breath. Negative for wheezing.   Cardiovascular: Positive for chest pain. Negative for palpitations.  Gastrointestinal: Negative for nausea and vomiting.  All other systems reviewed and are negative.    Allergies  Review of patient's allergies indicates no known allergies.  Home Medications   Current Outpatient Rx  Name Route Sig Dispense Refill  . ALBUTEROL SULFATE HFA 108 (90 BASE) MCG/ACT IN AERS Inhalation Inhale 2 puffs into the lungs every 2 (two) hours as needed for wheezing or shortness of breath (cough). 1 Inhaler 0  . AMLODIPINE BESYLATE 10 MG PO TABS Oral Take 10 mg by mouth every morning.      . ASPIRIN EC 81 MG PO TBEC Oral Take 81 mg by mouth every morning.      Marland Kitchen CALCITRIOL 0.25 MCG PO CAPS Oral Take 0.25 mcg by mouth every morning.     Marland Kitchen CARBOXYMETHYLCELLUL-GLYCERIN 0.5-0.9 % OP  SOLN Ophthalmic Apply to eye. One drop in both eyes two times a day      . CARVEDILOL 25 MG PO TABS Oral Take 1 tablet (25 mg total) by mouth 2 (two) times daily with a meal. 60 tablet 1  . CEFUROXIME AXETIL 250 MG PO TABS Oral Take 1 tablet (250 mg total) by mouth 2 (two) times daily. 14 tablet 0  . CITALOPRAM HYDROBROMIDE 20 MG PO TABS Oral Take 20 mg by mouth every morning.      Marland Kitchen CLONIDINE HCL 0.1 MG PO TABS Oral Take 2 tablets (0.2 mg total) by mouth 2 (two) times daily. 60 tablet 0  . GLUCERNA SHAKE PO LIQD Oral Take 237 mLs by mouth daily.      Marland Kitchen FLUTICASONE PROPIONATE 50 MCG/ACT NA SUSP Nasal 2 sprays by Nasal route daily.      . FUROSEMIDE 80 MG PO TABS Oral Take 1 tablet (80 mg total) by mouth 2 (two) times daily. 60 tablet 1  . GLUCOSE BLOOD VI  STRP Other 1 each by Other route as needed. Use as instructed     . HYDRALAZINE HCL 100 MG PO TABS Oral Take 1 tablet (100 mg total) by mouth every 8 (eight) hours. 90 tablet 1  . INSULIN ASPART PROT & ASPART (70-30) 100 UNIT/ML Balm SUSP Subcutaneous Inject 4 Units into the skin 2 (two) times daily with a meal. 10 mL 0  . INSULIN PEN NEEDLE 29G X 12MM MISC Subcutaneous Inject into the skin. Use with insuline pen two times a day     . POLYSACCHARIDE IRON COMPLEX 150 MG PO CAPS Oral Take 150 mg by mouth every morning.     Glory Rosebush LANCETS MISC Does not apply by Does not apply route.      Marland Kitchen POTASSIUM CHLORIDE ER 10 MEQ PO TBCR Oral Take 1 tablet (10 mEq total) by mouth daily. Take with your evening dose of lasix 30 tablet 6  . TENEX 2 MG PO TABS  TAKE ONE TABLET BY MOUTH AT BEDTIME. 30 each 3  . ZOCOR 10 MG PO TABS  TAKE (1) TABLET BY MOUTH IN THE EVENING. 30 each 3    BP 179/62  Pulse 68  Temp(Src) 98.5 F (36.9 C) (Oral)  Resp 26  SpO2 100%  LMP 12/24/2010  Physical Exam  Nursing note and vitals reviewed. Constitutional: She is oriented to person, place, and time. She appears well-developed and well-nourished.  HENT:  Head: Normocephalic and atraumatic.  Neck: Normal range of motion. Neck supple.  Cardiovascular: Normal rate and regular rhythm.  Exam reveals no friction rub.   No murmur heard. Pulmonary/Chest: Effort normal. She has rales.       Rales in bases bilaterally.  Abdominal: Soft. Bowel sounds are normal. She exhibits no distension. There is no tenderness.  Musculoskeletal: Normal range of motion. She exhibits edema.       2+ edema in ble.  Neurological: She is alert and oriented to person, place, and time. No cranial nerve deficit. Coordination normal.  Skin: Skin is warm and dry. She is not diaphoretic.    Date: 01/21/2011  Rate: 68  Rhythm: normal sinus rhythm  QRS Axis: normal  Intervals: normal  ST/T Wave abnormalities: nonspecific ST changes  Conduction  Disutrbances:none  Narrative Interpretation:   Old EKG Reviewed: changes noted    ED Course  Procedures (including critical care time)  Labs Reviewed - No data to display No results found.   No diagnosis found.  MDM  Workup looks like chf.  She was just discharged yesterday from [facility], symptoms worsening and does not feel as though she is well enough to go home.  Will consult medicine for admission.        Veryl Speak, MD 01/22/11 [phone]

## 2011-01-21 NOTE — ED Notes (Signed)
Patient denies pain and is resting comfortably.  

## 2011-01-21 NOTE — H&P (Signed)
History and Physical        [name] [id number] DOB: 15-May-1925 DOA: 01/21/2011  PCP:   Vic Blackbird, MD, MD   Chief Complaint:  Shortness of breath  HPI: Patient is 78 year old [place] female with multiple medical problems including diabetes mellitus, hypertension, history of prior stroke with residual right-sided weakness, history of chronic kidney disease stage V with left AV fistula but not on hemodialysis yet, presented to [facility] emergency room with shortness of breath started this morning. History was obtained from the patient and her daughter-in-law present in the room. Per patient, she was just discharged from [facility] yesterday. Per patient she had felt better yesterday however she missed all her medications in the evening. This morning her caregiver noted that she was more short of breath and also complaint of chest pain. Patient stated that the chest pain was chest tightness, midsternal, 8/10 intensity with no radiation and difficulty breathing. At the time of my examination the chest pain had completely resolved. Patient had no fever or chills or any wheezing, palpitations, no nausea or vomiting.  Review of Systems:  Constitutional: Denies fever, chills, diaphoresis, appetite change and fatigue.  HEENT: Denies photophobia, eye pain, redness, hearing loss, ear pain, congestion, sore throat, rhinorrhea, sneezing, mouth sores, trouble swallowing, neck pain, neck stiffness and tinnitus.   Respiratory: See history of present illness   Cardiovascular: See history of present illness  Gastrointestinal: Denies nausea, vomiting, abdominal pain, diarrhea, constipation, blood in stool and abdominal distention.  Genitourinary: Denies dysuria, urgency, frequency, hematuria, flank pain and difficulty urinating.  Musculoskeletal: Denies myalgias, back pain, joint swelling, arthralgias and gait problem.  Skin: Denies  pallor, rash and wound.  Neurological: Denies dizziness, seizures, syncope, weakness, light-headedness, numbness and headaches.  Hematological: Denies adenopathy. Easy bruising, personal or family bleeding history  Psychiatric/Behavioral: Denies suicidal ideation, mood changes, confusion, nervousness, sleep disturbance and agitation  Past Medical History: Past Medical History  Diagnosis Date  . Anxiety   . Diabetes mellitus, type 2   . Hypertension   . Stroke     residual right side weakness and pain  . IBS (irritable bowel syndrome)     with costipation  . Overactive bladder   . Cataracts, both eyes   . Gynecologic cancer   . Hyperlipidemia   . CKD (chronic kidney disease), stage V   . Anemia in CKD (chronic kidney disease)   . SVT (supraventricular tachycardia)   . CHF (congestive heart failure)   . Arthritis     gout  . SOB (shortness of breath)     nml spirometry 09/05/06   Past Surgical History  Procedure Date  . Vesicovaginal fistula closure w/ tah   . Av fistula placement, brachiocephalic [id number]    left arm   by Dr. Bridgett Larsson  . Tee without cardioversion 2011    2/2 Strep Bovis infection    Medications: Prior to Admission medications   Medication Sig Start Date End Date Taking? Authorizing Provider  albuterol (PROVENTIL HFA;VENTOLIN HFA) 108 (90 BASE) MCG/ACT inhaler Inhale 2 puffs into the lungs every 2 (two) hours as needed. Shortness of breath 12/27/10 12/27/11 Yes Babette Relic, MD  amLODipine (NORVASC) 10 MG tablet Take 10 mg by mouth every morning.   01/13/11 01/13/12 Yes Jehanzeb Memon  calcitRIOL (ROCALTROL) 0.25 MCG capsule Take 0.25 mcg by mouth every morning.    Yes Historical Provider, MD  Carboxymethylcellul-Glycerin (OPTIVE) 0.5-0.9 % SOLN Apply to eye. One drop in both eyes two  times a day     Yes Historical Provider, MD  carvedilol (COREG) 25 MG tablet Take 1 tablet (25 mg total) by mouth 2 (two) times daily with a meal. 01/13/11 01/13/12 Yes Jehanzeb Memon    citalopram (CELEXA) 20 MG tablet Take 20 mg by mouth every morning.     Yes Historical Provider, MD  cloNIDine (CATAPRES) 0.1 MG tablet Take 0.1 mg by mouth 2 (two) times daily. 01/20/11 01/20/12 Yes Nimish C Gosrani  feeding supplement (GLUCERNA SHAKE) LIQD Take 237 mLs by mouth daily.     Yes Historical Provider, MD  fluticasone (FLONASE) 50 MCG/ACT nasal spray 2 sprays by Nasal route daily.     Yes Historical Provider, MD  furosemide (LASIX) 80 MG tablet Take 1 tablet (80 mg total) by mouth 2 (two) times daily. 01/20/11  Yes Nimish C Gosrani  hydrALAZINE (APRESOLINE) 100 MG tablet Take 1 tablet (100 mg total) by mouth every 8 (eight) hours. 01/13/11 01/13/12 Yes Jehanzeb Memon  insulin aspart protamine-insulin aspart (NOVOLOG 70/30) (70-30) 100 UNIT/ML injection Inject 5-8 Units into the skin 2 (two) times daily with a meal. Inject 8 units every morning and inject 5 units every evening 01/20/11  Yes Nimish C Gosrani  iron polysaccharides (POLY-IRON 150) 150 MG capsule Take 150 mg by mouth every morning.  11/02/10  Yes Vic Blackbird, MD  potassium chloride (K-DUR) 10 MEQ tablet Take 1 tablet (10 mEq total) by mouth daily. Take with your evening dose of lasix 10/28/10 10/28/11 Yes Vic Blackbird, MD  TENEX 2 MG tablet TAKE ONE TABLET BY MOUTH AT BEDTIME. 12/04/10  Yes Vic Blackbird, MD  ZOCOR 10 MG tablet TAKE (1) TABLET BY MOUTH IN THE EVENING. 01/02/11  Yes Vic Blackbird, MD    Allergies:  No Known Allergies  Social History:  reports that she has never smoked. She has never used smokeless tobacco. She reports that she does not drink alcohol or use illicit drugs.  Family History: History reviewed. No pertinent family history.  Physical Exam: Blood pressure 186/73, pulse 70, temperature 98.6 F (37 C), temperature source Oral, resp. rate 15, last menstrual period 12/24/2010, SpO2 100.00%. General: Alert, awake, oriented x3, in no acute distress. HEENT: anicteric sclera, pink conjunctiva, pupils  equal and reactive to light and accomodation Neck: supple, no masses or lymphadenopathy, no goiter, no bruits  Heart: Regular rate and rhythm, without murmurs, rubs or gallops. Lungs: Decreased breath sounds at the bases with bibasilar crackles  Abdomen: Soft, nontender, nondistended, positive bowel sounds, no masses. Extremities: No clubbing, cyanosis, 1+ edema with positive pedal pulses. Neuro: Grossly intact, no focal neurological deficits, strength 5/5 upper and lower extremities bilaterally Psych: alert and oriented x 3, normal mood and affect Skin: no rashes or lesions, warm and dry   LABS on Admission:  Basic Metabolic Panel:  Lab 99991111 1537 01/20/11 0521  NA 140 142  K 4.5 4.7  CL 104 108  CO2 26 30  GLUCOSE 130* 117*  BUN 50* 53*  CREATININE 2.62* 2.65*  CALCIUM 9.0 8.9  MG -- --  PHOS -- --   Liver Function Tests:  Lab 01/21/11 1537 01/19/11 0619  AST 14 17  ALT 8 8  ALKPHOS 85 65  BILITOT 0.4 0.4  PROT 7.0 6.0  ALBUMIN 3.1* 2.5*    Lab 01/17/11 2054 01/15/11 1422  LIPASE 25 36  AMYLASE -- --   CBC:  Lab 01/21/11 1537 01/20/11 0521  WBC 6.6 4.6  NEUTROABS 5.1 --  HGB 8.8*  8.0*  HCT 27.9* 25.3*  MCV 72.8* --  PLT 214 177   Cardiac Enzymes:  Lab 01/21/11 1537 01/18/11 2356  CKTOTAL 80 72  CKMB 3.1 3.1  CKMBINDEX -- --  TROPONINI <0.30 <0.30   BNP: CBG:  Lab 01/20/11 1134 01/20/11 0738  GLUCAP 108* 110*     Radiological Exams on Admission: Ct Abdomen Pelvis Wo Contrast  01/15/2011  *RADIOLOGY REPORT*  Clinical Data: Upper abdominal pain, gynecologic cancer status post hysterectomy  CT ABDOMEN AND PELVIS WITHOUT CONTRAST  Technique:  Multidetector CT imaging of the abdomen and pelvis was performed following the standard protocol without intravenous contrast.  Comparison: 11/01/2009  Findings: Small bilateral pleural effusions.  Associated lower lobe opacities, possibly atelectasis.  No focal hepatic lesions.  Mild left pneumobilia,  correlate for prior sphincterotomy.  Unenhanced spleen, pancreas, and adrenal glands are within normal limits.  Distended gallbladder with layering small gallstones (series 2/image 38), similar to 2011.  No associated inflammatory changes by CT.  No intrahepatic or extrahepatic ductal dilatation.  Bilateral renal cortical atrophy.  No renal calculi or hydronephrosis.  No evidence of bowel obstruction.  No colonic wall thickening or inflammatory changes.  Atherosclerotic calcifications of the abdominal aorta and branch vessels.  No abdominopelvic ascites.  No suspicious abdominopelvic lymphadenopathy.  Status post hysterectomy.  No adnexal masses.  Bladder is within normal limits.  Degenerative changes of the visualized thoracolumbar spine.  IMPRESSION: Distended gallbladder with cholelithiasis, similar to 2011.  No associated inflammatory changes by CT.  No evidence of bowel obstruction.  Small bilateral pleural effusions with associated lower lobe opacities, possibly atelectasis.  Original Report Authenticated By: Julian Hy, M.D.   Dg Chest 1 View  01/12/2011  *RADIOLOGY REPORT*  Clinical Data: Shortness of breath and weakness.  Question congestive heart failure.  CHEST - 1 VIEW  Comparison: 01/09/2011 and 01/05/2011.  Findings: 1320 hours.  There is stable cardiomegaly with chronic vascular congestion and bibasilar atelectasis.  As before, there may be small bilateral pleural effusions.  There is no focal airspace disease or definite change compared with the prior studies.  The patient likely has a chronic rotator cuff tear on the left.  The thoracic esophagus appears dilated proximally.  IMPRESSION: Stable cardiomegaly, vascular congestion and bibasilar atelectasis.  Original Report Authenticated By: Vivia Ewing, M.D.   Dg Chest 1 View  01/05/2011  *RADIOLOGY REPORT*  Clinical Data: Fever, altered level of consciousness for several days  CHEST - 1 VIEW  Comparison: Portable chest x-ray of  12/27/2010  Findings: Moderate cardiomegaly remains with perhaps mild pulmonary vascular congestion.  Slight elevation of the left hemidiaphragm is stable.  No acute bony abnormality is seen.  IMPRESSION: Stable moderate cardiomegaly with question of mild pulmonary vascular congestion.  Original Report Authenticated By: Joretta Bachelor, M.D.   Dg Chest 2 View  01/19/2011  *RADIOLOGY REPORT*  Clinical Data: History of labored breathing.  History of congestive heart failure.  Follow-up.  CHEST - 2 VIEW  Comparison: 01/17/2011.  Findings: There is stable enlargement of the cardiac silhouette. Ectasia and tortuosity of thoracic aorta is seen.  There is moderate upper lobe vascular prominence consistent with mild pulmonary venous hypertension vascular congestion.  There is minimal right basilar atelectasis.  There is continued elevation of the left hemidiaphragm with moderate atelectasis in the left lung base.  No consolidation or definite pleural effusion is seen. There is osteopenic appearance of the bones.  IMPRESSION: Stable enlargement cardiac silhouette.  Moderate vascular congestion pattern.  Minimal right basilar atelectasis.  Continued elevation of the left hemidiaphragm with moderate atelectasis in the left lung base.  No consolidation or definite pleural effusion is seen.  Original Report Authenticated By: Delane Ginger, M.D.   Dg Hip Complete Right  01/06/2011  *RADIOLOGY REPORT*  Clinical Data: Fall, right-sided hip and ankle pain  RIGHT HIP - COMPLETE 2+ VIEW  Comparison: Plain films 06/15/2010  Findings: Hips are located.  Dedicated views of the right hip demonstrate no femoral neck fracture.  No pelvic fracture or sacral fracture.  Moderate volume stool in the ascending colon.  IMPRESSION: No evidence of pelvic fracture or left hip fracture.  Original Report Authenticated By: Suzy Bouchard, M.D.   Dg Ankle 2 Views Right  01/06/2011  *RADIOLOGY REPORT*  Clinical Data: Right-sided pain, ankle pain   RIGHT ANKLE - 2 VIEW  Comparison: None.  Findings: Ankle mortise intact.  The talar dome is normal.  No evidence of malleolar fracture.  Osteopenia.  IMPRESSION: No evidence of fracture on two views of the ankle.  Original Report Authenticated By: Suzy Bouchard, M.D.   Ct Head Wo Contrast  01/11/2011  *RADIOLOGY REPORT*  Clinical Data: Headache.  History of CVA with residual right-sided weakness status post fall 1 week ago.  Evaluate for progressive bleeding.  CT HEAD WITHOUT CONTRAST  Technique:  Contiguous axial images were obtained from the base of the skull through the vertex without contrast.  Comparison: CT 01/05/2011.  MRI 01/06/2011.  Findings: A small amount of intraventricular hemorrhage was noted layering in the occipital horns on the prior examinations. This is not visible on the current examination.  There is no evidence of progressive hemorrhage or extra-axial fluid collection.  There is no evidence of acute infarction.  Atrophy and mild chronic small vessel extending changes are stable.  There is mild mucosal thickening in the right ethmoid and frontal sinuses.  The calvarium is intact.  IMPRESSION: Stable examination with atrophy and chronic small vessel ischemic changes.  No evidence of progressive hemorrhage or hydrocephalus.  Original Report Authenticated By: Vivia Ewing, M.D.   Ct Head Wo Contrast  01/05/2011  *RADIOLOGY REPORT*  Clinical Data:  Of 2 days ago with bruising in the right side of the neck, altered mental status, weakness  CT HEAD WITHOUT CONTRAST CT CERVICAL SPINE WITHOUT CONTRAST  Technique:  Multidetector CT imaging of the head and cervical spine was performed following the standard protocol without intravenous contrast.  Multiplanar CT image reconstructions of the cervical spine were also generated.  Comparison:  CT brain scan of 04/30/2010  CT HEAD  Findings: The ventricular system remains prominent, as are the cortical sulci and cerebellar folia, consistent with  diffuse atrophy.  The septum is midline position mild small vessel ischemic changes present in the prep ventricle white matter.  No hemorrhage, mass lesion, or acute infarction is seen.  There is mucosal thickening in the left maxillary sinus but no air-fluid level is seen.  Some opacification of ethmoid air cells is present and there is a small amount of fluid within the left partition of the sphenoid sinus as well the opacification of the right frontal sinus is noted.  On bone window images no acute calvarial abnormality is seen.  IMPRESSION:  1.  Stable atrophy and small vessel disease.  No acute intracranial abnormality. 2.  Diffuse sinus disease.  CT CERVICAL SPINE  Findings: The cervical vertebrae are slightly straightened in alignment.  There is diffuse degenerative change with some loss of joint  space and spurring.  However no acute cervical spine fracture is seen.  The odontoid process is intact.  No prevertebral soft tissue swelling is noted.  There are the thyroid gland is slightly prominent and inhomogeneous with possible low attenuation nodule on the left.  IMPRESSION: Straightened alignment with diffuse degenerative change in osteopenia.  No acute abnormality.  Original Report Authenticated By: Joretta Bachelor, M.D.   Ct Cervical Spine Wo Contrast  01/05/2011  *RADIOLOGY REPORT*  Clinical Data:  Of 2 days ago with bruising in the right side of the neck, altered mental status, weakness  CT HEAD WITHOUT CONTRAST CT CERVICAL SPINE WITHOUT CONTRAST  Technique:  Multidetector CT imaging of the head and cervical spine was performed following the standard protocol without intravenous contrast.  Multiplanar CT image reconstructions of the cervical spine were also generated.  Comparison:  CT brain scan of 04/30/2010  CT HEAD  Findings: The ventricular system remains prominent, as are the cortical sulci and cerebellar folia, consistent with diffuse atrophy.  The septum is midline position mild small vessel  ischemic changes present in the prep ventricle white matter.  No hemorrhage, mass lesion, or acute infarction is seen.  There is mucosal thickening in the left maxillary sinus but no air-fluid level is seen.  Some opacification of ethmoid air cells is present and there is a small amount of fluid within the left partition of the sphenoid sinus as well the opacification of the right frontal sinus is noted.  On bone window images no acute calvarial abnormality is seen.  IMPRESSION:  1.  Stable atrophy and small vessel disease.  No acute intracranial abnormality. 2.  Diffuse sinus disease.  CT CERVICAL SPINE  Findings: The cervical vertebrae are slightly straightened in alignment.  There is diffuse degenerative change with some loss of joint space and spurring.  However no acute cervical spine fracture is seen.  The odontoid process is intact.  No prevertebral soft tissue swelling is noted.  There are the thyroid gland is slightly prominent and inhomogeneous with possible low attenuation nodule on the left.  IMPRESSION: Straightened alignment with diffuse degenerative change in osteopenia.  No acute abnormality.  Original Report Authenticated By: Joretta Bachelor, M.D.   Mr Brain Wo Contrast  01/06/2011  *RADIOLOGY REPORT*  Clinical Data: Evaluated for possible stroke.  Altered mental status.  Recent fall  MRI HEAD WITHOUT CONTRAST  Technique:  Multiplanar, multiecho pulse sequences of the brain and surrounding structures were obtained according to standard protocol without intravenous contrast.  Comparison: CT head 01/05/2011.  Findings: The patient was moderately uncooperative and could not remain motionless for the study.  Images are suboptimal.  Small or subtle lesions could be overlooked.  Motion degraded diffusion weighted images show no acute stroke. There is advanced atrophy with chronic microvascular ischemic change. A small osteoma projects from the inner table of the calvarium in the right frontal bone.   There are small foci of intraventricular hemorrhage layering posteriorly on gradient sequence left greater than right (images 11 and 12 of series 10) reflecting previous closed head injury.  No parenchymal bleed is seen.  There is no midline shift.  Intracranial vasculature is grossly patent. Pituitary and cerebellar tonsils grossly unremarkable.  The foci of hemorrhage are better seen on MR than on CT.  IMPRESSION: No acute stroke.  Subtle interventricular hemorrhage reflecting previous closed head injury.  Atrophy and small vessel disease.  Original Report Authenticated By: Staci Righter, M.D.   Dg Chest [facility]  1 View  01/21/2011  *RADIOLOGY REPORT*  Clinical Data: Shortness of breath.  PORTABLE CHEST - 1 VIEW  Comparison: Chest x-ray 01/19/2011.  Findings: The heart is enlarged but stable.  There is vascular congestion and mild interstitial pulmonary edema.  Small pleural effusions are noted along with left greater than right basilar atelectasis.  IMPRESSION: CHF.  Original Report Authenticated By: P. Kalman Jewels, M.D.   Dg Chest Port 1 View  01/17/2011  *RADIOLOGY REPORT*  Clinical Data: Short of breath.  Weakness.  PORTABLE CHEST - 1 VIEW  Comparison: 01/12/2011.  Findings: Cardiomegaly with pulmonary vascular congestion and interstitial pulmonary edema.  The chronic elevation of the left hemidiaphragm with left basilar atelectasis.  Tortuous thoracic aorta.  IMPRESSION: Cardiomegaly and interstitial pulmonary edema compatible with mild CHF.  Original Report Authenticated By: Dereck Ligas, M.D.   Dg Chest Port 1 View  01/09/2011  *RADIOLOGY REPORT*  Clinical Data: Question of CHF.  Shortness of breath.  PORTABLE CHEST - 1 VIEW  Comparison: 01/05/2011  Findings: Heart is enlarged.  The film is made with shallow lung inflation.  There is pulmonary vascular congestion but no overt edema.  There are no focal consolidations.  There is minimal left lower lobe subsegmental atelectasis.  Small bilateral  pleural effusions are suspected.  IMPRESSION:  1.  Cardiomegaly and vascular congestion. 2.  Left base atelectasis. 3.  Small bilateral pleural effusions are suspected.  Original Report Authenticated By: Glenice Bow, M.D.   Dg Chest Portable 1 View  12/27/2010  *RADIOLOGY REPORT*  Clinical Data: Shortness of breath.  Cough  PORTABLE CHEST - 1 VIEW  Comparison: 07/10/2010  Findings: Heart size appears moderately enlarged.  There is moderate pulmonary venous congestion.  There is asymmetric elevation of the left hemidiaphragm.  Opacity within the left lung base is noted consistent with pneumonia or atelectasis.  This appears similar to previous exam.  IMPRESSION: 1.  Cardiac enlargement and pulmonary venous congestion. 2.  Asymmetric elevation of the left hemidiaphragm with left base opacity.  Original Report Authenticated By: Angelita Ingles, M.D.   Dg Knee Complete 4 Views Right  01/05/2011  *RADIOLOGY REPORT*  Clinical Data: [place] several days ago with pain  RIGHT KNEE - COMPLETE 4+ VIEW  Comparison: MR right knee of 02/25/2010  Findings: Tricompartmental degenerative joint disease is again noted with loss of joint space, sclerosis and spurring involving the compartment.  There is also chondrocalcinosis present which can be seen with CPPD or osteoarthritis.  No acute fracture is seen.  A small right knee joint effusion is present.  IMPRESSION: 1.  Significant tricompartmental degenerative joint disease with small joint effusion.  No fracture. 2.  Chondrocalcinosis.  Original Report Authenticated By: Joretta Bachelor, M.D.   Dg Abd Acute W/chest  01/15/2011  *RADIOLOGY REPORT*  Clinical Data: Abdominal pain  ACUTE ABDOMEN SERIES (ABDOMEN 2 VIEW & CHEST 1 VIEW)  Comparison: CT abdomen pelvis dated 11/02/2010  Findings: Cardiomegaly with pulmonary vascular congestion.  No frank interstitial edema. No pleural effusion or pneumothorax.  Nonobstructive bowel gas pattern.  Moderate stool throughout the  colon.  No evidence of free air under the diaphragm on the upright view.  Degenerative changes of the visualized thoracolumbar spine.  Vascular calcifications.  IMPRESSION: Cardiomegaly with pulmonary vascular congestion.  No frank interstitial edema.  No evidence of small bowel obstruction or free air.  Moderate stool throughout the colon.  Original Report Authenticated By: Julian Hy, M.D.    Assessment/Plan Present on Admission:  .  CHF, acute on chronic with chest tightness: - Admit patient to step down unit tonight for closer observation, patient received 80 mg IV Lasix in ED followed by 40 mg IV Lasix. Obtain serial cardiac enzymes, d-dimer to rule out any PE. BNP elevated at 12,865. Obtain 2-D echo. - Place on IV Lasix 40 mg twice a day, Coreg. No ACE/ARB secondary to renal insufficiency. Patient is having frequent CHF exacerbation/volume overload, she has chronic kidney disease stage V with AV fistula. Cardiology and renal service may need to be consulted for comanagement of CHF and CKD, question if patient needs to be on dialysis now.   Marland KitchenDIABETES MELLITUS, CONTROLLED, WITH COMPLICATIONS:  - Obtain Hb A1c, place on insulin   .HYPERLIPIDEMIA: Obtain lipid panel   .ANEMIA OF RENAL FAILURE: Patient on iron supplementation, continue   .ANXIETY: Stable   .HYPERTENSION: BP likely elevated due to missing her medication doses, restart all antihypertensives, place on IV when necessary hydralazine.   .CKD (chronic kidney disease) stage 5, GFR less than 15 ml/min   DVT prophylaxis: SCDs  CODE STATUS: Full code  @Time  Spent on Admission:  1 hour  RAI,RIPUDEEP 01/21/2011, 9:14 PM

## 2011-01-21 NOTE — ED Notes (Signed)
Pharmacy called to verify catapres  Dosage and was told that the patient should get only 0.2mg  not 0.3.  O.2 mg given along with po apresoline.  Apresoline 10mg  iv also given for BP 200/70

## 2011-01-22 ENCOUNTER — Inpatient Hospital Stay (HOSPITAL_COMMUNITY): Payer: Medicare HMO

## 2011-01-22 ENCOUNTER — Ambulatory Visit: Payer: Medicare Other | Admitting: Family Medicine

## 2011-01-22 LAB — BASIC METABOLIC PANEL
CO2: 28 mEq/L (ref 19–32)
Calcium: 8.8 mg/dL (ref 8.4–10.5)
Creatinine, Ser: 2.66 mg/dL — ABNORMAL HIGH (ref 0.50–1.10)
Glucose, Bld: 135 mg/dL — ABNORMAL HIGH (ref 70–99)

## 2011-01-22 LAB — URINALYSIS, ROUTINE W REFLEX MICROSCOPIC
Bilirubin Urine: NEGATIVE
Glucose, UA: NEGATIVE mg/dL
Hgb urine dipstick: NEGATIVE
Ketones, ur: NEGATIVE mg/dL
Nitrite: NEGATIVE
Protein, ur: >300 mg/dL — AB
Specific Gravity, Urine: 1.014 (ref 1.005–1.030)
Urobilinogen, UA: 1 mg/dL (ref 0.0–1.0)
pH: 5.5 (ref 5.0–8.0)

## 2011-01-22 LAB — GLUCOSE, CAPILLARY
Glucose-Capillary: 124 mg/dL — ABNORMAL HIGH (ref 70–99)
Glucose-Capillary: 114 mg/dL — ABNORMAL HIGH (ref 70–99)

## 2011-01-22 LAB — URINE MICROSCOPIC-ADD ON

## 2011-01-22 LAB — CARDIAC PANEL(CRET KIN+CKTOT+MB+TROPI)
Relative Index: INVALID (ref 0.0–2.5)
Total CK: 71 U/L (ref 7–177)

## 2011-01-22 MED ORDER — TECHNETIUM TO 99M ALBUMIN AGGREGATED
5.5000 | Freq: Once | INTRAVENOUS | Status: AC | PRN
  Administered 2011-01-22: 5.5 via INTRAVENOUS

## 2011-01-22 MED ORDER — XENON XE 133 GAS
5.4000 | GAS_FOR_INHALATION | Freq: Once | RESPIRATORY_TRACT | Status: AC | PRN
  Administered 2011-01-22: 5.4 via RESPIRATORY_TRACT

## 2011-01-22 MED ORDER — FUROSEMIDE 80 MG PO TABS
160.0000 mg | ORAL_TABLET | Freq: Two times a day (BID) | ORAL | Status: DC
  Administered 2011-01-22 – 2011-01-24 (×4): 160 mg via ORAL
  Filled 2011-01-22 (×5): qty 2

## 2011-01-22 MED ORDER — INSULIN ASPART PROT & ASPART (70-30 MIX) 100 UNIT/ML ~~LOC~~ SUSP
5.0000 [IU] | Freq: Every day | SUBCUTANEOUS | Status: DC
  Administered 2011-01-22 – 2011-01-25 (×4): 5 [IU] via SUBCUTANEOUS

## 2011-01-22 MED ORDER — INSULIN ASPART PROT & ASPART (70-30 MIX) 100 UNIT/ML ~~LOC~~ SUSP
8.0000 [IU] | Freq: Every day | SUBCUTANEOUS | Status: DC
  Administered 2011-01-22 – 2011-01-26 (×5): 8 [IU] via SUBCUTANEOUS
  Filled 2011-01-22: qty 3

## 2011-01-22 MED ORDER — AMLODIPINE BESYLATE 10 MG PO TABS
10.0000 mg | ORAL_TABLET | Freq: Every day | ORAL | Status: DC
  Administered 2011-01-23 – 2011-01-26 (×4): 10 mg via ORAL
  Filled 2011-01-22 (×5): qty 1

## 2011-01-22 MED ORDER — ISOSORBIDE MONONITRATE ER 30 MG PO TB24
30.0000 mg | ORAL_TABLET | Freq: Every day | ORAL | Status: DC
  Administered 2011-01-22 – 2011-01-24 (×3): 30 mg via ORAL
  Filled 2011-01-22 (×4): qty 1

## 2011-01-22 NOTE — ED Notes (Signed)
Patient has mature ac fistula LAC +bruit and thrill

## 2011-01-22 NOTE — ED Notes (Signed)
Called beds to see if patient will get bed this pm  And was told that she will.   Patient informed of this

## 2011-01-22 NOTE — Consult Note (Signed)
Reason for Consult:CKD 4 Referring Physician: DR. Olean Wood is an 78 y.o. female.  HPI: 78 yr old female unknown to [place] followed by Dr [name] in [place].  Hx Ckd, has AVF iin LUA.  Admitted with CHF, primary vol xs.   Recently multlple admits for the same but diuretics not ^.  Also recent falls and IVH.  Hx R body CVA in past also. Hx Htn since teens, DM 53 - 1 yr.  Hx catarracts, and arm surgery, and hx op on bladder for VV fistula.   ROS low vision, hx laser No asthma, and non smoker No cp or PNd, but ankle edema. No D, N, V, no hx Hep No skin rash  Arthitis in knees and ? Gout Anemic FH mother, 2 brothers and 2 sons diet of Ca one son menigitis. Father died in 25s unknow cause.  Multiple children and sisters with DM, HTN    Past Medical History  Diagnosis Date  . Anxiety   . Diabetes mellitus, type 2   . Hypertension   . Stroke     residual right side weakness and pain  . IBS (irritable bowel syndrome)     with costipation  . Overactive bladder   . Cataracts, both eyes   . Gynecologic cancer   . Hyperlipidemia   . CKD (chronic kidney disease), stage V   . Anemia in CKD (chronic kidney disease)   . SVT (supraventricular tachycardia)   . CHF (congestive heart failure)   . Arthritis     gout  . SOB (shortness of breath)     nml spirometry 09/05/06    Past Surgical History  Procedure Date  . Vesicovaginal fistula closure w/ tah   . Av fistula placement, brachiocephalic [id number]    left arm   by Dr. Bridgett Wood  . Tee without cardioversion 2011    2/2 Strep Bovis infection    History reviewed. No pertinent family history.  Social History:  reports that she has never smoked. She has never used smokeless tobacco. She reports that she does not drink alcohol or use illicit drugs.  Allergies: No Known Allergies  Medications: I have reviewed the patient's current medications.    Results for orders placed during the hospital encounter of 01/21/11  (from the past 48 hour(s))  CBC     Status: Abnormal   Collection Time   01/21/11  3:37 PM      Component Value Range Comment   WBC 6.6  4.0 - 10.5 (K/uL)    RBC 3.83 (*) 3.87 - 5.11 (MIL/uL)    Hemoglobin 8.8 (*) 12.0 - 15.0 (g/dL)    HCT 27.9 (*) 36.0 - 46.0 (%)    MCV 72.8 (*) 78.0 - 100.0 (fL)    MCH 23.0 (*) 26.0 - 34.0 (pg)    MCHC 31.5  30.0 - 36.0 (g/dL)    RDW 16.5 (*) 11.5 - 15.5 (%)    Platelets 214  150 - 400 (K/uL)   DIFFERENTIAL     Status: Normal   Collection Time   01/21/11  3:37 PM      Component Value Range Comment   Neutrophils Relative 77  43 - 77 (%)    Neutro Abs 5.1  1.7 - 7.7 (K/uL)    Lymphocytes Relative 14  12 - 46 (%)    Lymphs Abs 1.0  0.7 - 4.0 (K/uL)    Monocytes Relative 7  3 - 12 (%)    Monocytes Absolute  0.5  0.1 - 1.0 (K/uL)    Eosinophils Relative 1  0 - 5 (%)    Eosinophils Absolute 0.0  0.0 - 0.7 (K/uL)    Basophils Relative 0  0 - 1 (%)    Basophils Absolute 0.0  0.0 - 0.1 (K/uL)   COMPREHENSIVE METABOLIC PANEL     Status: Abnormal   Collection Time   01/21/11  3:37 PM      Component Value Range Comment   Sodium 140  135 - 145 (mEq/L)    Potassium 4.5  3.5 - 5.1 (mEq/L)    Chloride 104  96 - 112 (mEq/L)    CO2 26  19 - 32 (mEq/L)    Glucose, Bld 130 (*) 70 - 99 (mg/dL)    BUN 50 (*) 6 - 23 (mg/dL)    Creatinine, Ser 2.62 (*) 0.50 - 1.10 (mg/dL)    Calcium 9.0  8.4 - 10.5 (mg/dL)    Total Protein 7.0  6.0 - 8.3 (g/dL)    Albumin 3.1 (*) 3.5 - 5.2 (g/dL)    AST 14  0 - 37 (U/L)    ALT 8  0 - 35 (U/L)    Alkaline Phosphatase 85  39 - 117 (U/L)    Total Bilirubin 0.4  0.3 - 1.2 (mg/dL)    GFR calc non Af Amer 16 (*) >90 (mL/min)    GFR calc Af Amer 18 (*) >90 (mL/min)   CARDIAC PANEL(CRET KIN+CKTOT+MB+TROPI)     Status: Normal   Collection Time   01/21/11  3:37 PM      Component Value Range Comment   Total CK 80  7 - 177 (U/L)    CK, MB 3.1  0.3 - 4.0 (ng/mL)    Troponin I <0.30  <0.30 (ng/mL)    Relative Index RELATIVE INDEX IS  INVALID  0.0 - 2.5    PRO B NATRIURETIC PEPTIDE     Status: Abnormal   Collection Time   01/21/11  3:37 PM      Component Value Range Comment   Pro B Natriuretic peptide (BNP) 15325.0 (*) 0 - 450 (pg/mL)   D-DIMER, QUANTITATIVE     Status: Abnormal   Collection Time   01/21/11 10:13 PM      Component Value Range Comment   D-Dimer, Quant 2.31 (*) 0.00 - 0.48 (ug/mL-FEU)   CARDIAC PANEL(CRET KIN+CKTOT+MB+TROPI)     Status: Normal   Collection Time   01/21/11 10:20 PM      Component Value Range Comment   Total CK 79  7 - 177 (U/L)    CK, MB 3.1  0.3 - 4.0 (ng/mL)    Troponin I <0.30  <0.30 (ng/mL)    Relative Index RELATIVE INDEX IS INVALID  0.0 - 2.5    CARDIAC PANEL(CRET KIN+CKTOT+MB+TROPI)     Status: Normal   Collection Time   01/22/11  5:33 AM      Component Value Range Comment   Total CK 71  7 - 177 (U/L)    CK, MB 3.0  0.3 - 4.0 (ng/mL)    Troponin I <0.30  <0.30 (ng/mL)    Relative Index RELATIVE INDEX IS INVALID  0.0 - 2.5    BASIC METABOLIC PANEL     Status: Abnormal   Collection Time   01/22/11  5:33 AM      Component Value Range Comment   Sodium 139  135 - 145 (mEq/L)    Potassium 4.0  3.5 - 5.1 (mEq/L)  Chloride 103  96 - 112 (mEq/L)    CO2 28  19 - 32 (mEq/L)    Glucose, Bld 135 (*) 70 - 99 (mg/dL)    BUN 49 (*) 6 - 23 (mg/dL)    Creatinine, Ser 2.66 (*) 0.50 - 1.10 (mg/dL)    Calcium 8.8  8.4 - 10.5 (mg/dL)    GFR calc non Af Amer 16 (*) >90 (mL/min)    GFR calc Af Amer 18 (*) >90 (mL/min)   GLUCOSE, CAPILLARY     Status: Abnormal   Collection Time   01/22/11  6:12 AM      Component Value Range Comment   Glucose-Capillary 124 (*) 70 - 99 (mg/dL)    Comment 1 Documented in Chart      Comment 2 Notify RN     GLUCOSE, CAPILLARY     Status: Abnormal   Collection Time   01/22/11  8:08 AM      Component Value Range Comment   Glucose-Capillary 151 (*) 70 - 99 (mg/dL)   CARDIAC PANEL(CRET KIN+CKTOT+MB+TROPI)     Status: Normal   Collection Time   01/22/11 11:20 AM       Component Value Range Comment   Total CK 66  7 - 177 (U/L)    CK, MB 3.0  0.3 - 4.0 (ng/mL)    Troponin I <0.30  <0.30 (ng/mL)    Relative Index RELATIVE INDEX IS INVALID  0.0 - 2.5      Dg Chest Port 1 View  01/21/2011  *RADIOLOGY REPORT*  Clinical Data: Shortness of breath.  PORTABLE CHEST - 1 VIEW  Comparison: Chest x-ray 01/19/2011.  Findings: The heart is enlarged but stable.  There is vascular congestion and mild interstitial pulmonary edema.  Small pleural effusions are noted along with left greater than right basilar atelectasis.  IMPRESSION: CHF.  Original Report Authenticated By: P. Kalman Jewels, M.D.    @ROS @ Blood pressure 183/65, pulse 66, temperature 99 F (37.2 C), temperature source Oral, resp. rate 18, height 5\' 5"  (1.651 m), weight 89.7 kg (197 lb 12 oz), last menstrual period 12/24/2010, SpO2 96.00%. @PHYSEXAMBYAGE2 @ Physical Examination: General appearance - oriented to person, place, and time and poor historian and ? answers Mental status - as above Eyes - pupils equal and reactive, extraocular eye movements intact, funduscopic exam abnormal DM retinopathy Mouth - film on tongue Neck - obese Lymphatics  posterior cervical nodes Chest - rales noted bases, decreased air entry noted bilat Heart - normal rate, regular rhythm, normal S1, S2, no murmurs, rubs, clicks or gallops, S1 and S2 normal, systolic murmur gr 2/6 at 2nd left intercostal space Abdomen - Obese, liver down 5 cm, pos bs soft Musculoskeletal - deformities of knees Extremities - AVF LUA Skin - Pale DRy R cental 7th, R arm midly weaker than L  Assessment/Plan: 1 CKD 4 -5 vol xs needs diuresis and assess other complications. 2 Anemia eval 3 Hypertension: vol, will diurese and lower meds 4. HPTH assess 5. obesity 6 Dm 7 CVA P as above  [name] L 01/22/2011, 1:37 PM

## 2011-01-22 NOTE — ED Notes (Signed)
Family remains at bed side offered cool compress for patient heat problem and patient declines. Remains in SR w/ pacs occ.

## 2011-01-22 NOTE — ED Notes (Signed)
Patient BP is slowly coming down now 167/51 72 20

## 2011-01-22 NOTE — Progress Notes (Signed)
Physical Therapy Evaluation Patient Details Name: [name] MRN: [id number] DOB: [birth year] Today's Date: 01/22/2011 Time: [id number]  Eval II Problem List:  Patient Active Problem List  Diagnoses  . DIABETES MELLITUS, CONTROLLED, WITH COMPLICATIONS  . HYPERLIPIDEMIA  . ANEMIA OF RENAL FAILURE  . ANXIETY  . CATARACT NOS  . HYPERTENSION  . CONGESTIVE HEART FAILURE UNSPECIFIED  . ALLERGIC RHINITIS  . IBS  . Chronic kidney disease, stage 4, severely decreased GFR  . ARTHRITIS  . DEPENDENT EDEMA, LEGS, BILATERAL  . DYSPNEA ON EXERTION  . CEREBROVASCULAR ACCIDENT, HX OF  . Arm bruise  . Colon polyps  . Insomnia  . Fall  . Constipation  . Altered level of consciousness  . Chronic respiratory failure  . Nausea & vomiting  . SOB (shortness of breath)  . CKD (chronic kidney disease) stage 5, GFR less than 15 ml/min  . CHF, acute on chronic    Past Medical History:  Past Medical History  Diagnosis Date  . Anxiety   . Diabetes mellitus, type 2   . Hypertension   . Stroke     residual right side weakness and pain  . IBS (irritable bowel syndrome)     with costipation  . Overactive bladder   . Cataracts, both eyes   . Gynecologic cancer   . Hyperlipidemia   . CKD (chronic kidney disease), stage V   . Anemia in CKD (chronic kidney disease)   . SVT (supraventricular tachycardia)   . CHF (congestive heart failure)   . Arthritis     gout  . SOB (shortness of breath)     nml spirometry 09/05/06   Past Surgical History:  Past Surgical History  Procedure Date  . Vesicovaginal fistula closure w/ tah   . Av fistula placement, brachiocephalic [id number]    left arm   by Dr. Bridgett Larsson  . Tee without cardioversion 2011    2/2 Strep Bovis infection    PT Assessment/Plan/Recommendation PT Assessment Clinical Impression Statement: Pt presents with diagnosis of CHF. Pt wil benefit from skilled PT in the acute care setting to maximize home safety with functional mobility. PT  Recommendation/Assessment: Patient will need skilled PT in the acute care venue PT Problem List: Decreased strength;Decreased activity tolerance;Decreased mobility;Decreased knowledge of use of DME;Decreased safety awareness PT Therapy Diagnosis : Difficulty walking;Generalized weakness PT Plan PT Frequency: Min 3X/week PT Treatment/Interventions: Gait training;Functional mobility training;Therapeutic activities;Patient/family education;DME instruction PT Recommendation Recommendations for Other Services: OT consult Follow Up Recommendations: Home health PT Equipment Recommended: None recommended by PT PT Goals  Acute Rehab PT Goals PT Goal Formulation: With patient Time For Goal Achievement: 7 days Pt will go Supine/Side to Sit: with supervision PT Goal: Supine/Side to Sit - Progress: Not met Pt will go Sit to Supine/Side: with supervision PT Goal: Sit to Supine/Side - Progress: Not met Pt will go Sit to Stand: with supervision PT Goal: Sit to Stand - Progress: Not met Pt will go Stand to Sit: with supervision PT Goal: Stand to Sit - Progress: Not met Pt will Ambulate: 51 - 150 feet;with supervision;with least restrictive assistive device PT Goal: Ambulate - Progress: Not met  PT Evaluation Precautions/Restrictions  Precautions Precautions: Fall Prior Functioning  Home Living Lives With: Alone Receives Help From: Personal care attendant (8am-2pm/7days a week) Type of Home: Apartment Home Layout: One level Home Access: Level entry Home Adaptive Equipment: Walker - rolling;Quad cane Prior Function Level of Independence: Requires assistive device for independence;Needs assistance with ADLs;Needs assistance with  homemaking Cognition Cognition Arousal/Alertness: Awake/alert Overall Cognitive Status: Appears within functional limits for tasks assessed Orientation Level: Oriented X4 Sensation/Coordination Sensation Light Touch: Appears Intact Coordination Gross Motor  Movements are Fluid and Coordinated: Yes Extremity Assessment RLE Strength RLE Overall Strength Comments: Strength > or = 3+/5 with functional activity LLE Assessment LLE Assessment: Exceptions to [facility] LLE Strength LLE Overall Strength Comments: Strength > or = 3+/5 with functional activity Mobility (including Balance) Bed Mobility Bed Mobility: Yes Supine to Sit: 4: Min assist;HOB flat Supine to Sit Details (indicate cue type and reason): Assist for trunk to upright. Pt used therapist's hand to pull up on Transfers Transfers: Yes Sit to Stand: 4: Min assist;From bed;With upper extremity assist Sit to Stand Details (indicate cue type and reason): VCs safety, technique, hand placement. Assist to rise, stabilze Stand to Sit: 4: Min assist;To bed;With upper extremity assist Stand to Sit Details: Assist to control descent to surface.  Ambulation/Gait Ambulation/Gait: Yes Ambulation/Gait Assistance Details (indicate cue type and reason): Min-guard assist. VCs safety, technique, posture. Pt agreeable to ambulate in room-initally hesitant to participate due to being cold. Ambulation Distance (Feet): 15 Feet Assistive device: Rolling walker Gait Pattern: Step-through pattern;Decreased stride length;Decreased step length - right;Decreased step length - left  Posture/Postural Control Posture/Postural Control: No significant limitations Exercise    End of Session PT - End of Session Equipment Utilized During Treatment: Gait belt Activity Tolerance: Patient tolerated treatment well Patient left: in bed General Behavior During Session: [facility] for tasks performed Cognition: [facility] for tasks performed  [facility] 01/22/2011, 4:03 PM [phone]

## 2011-01-22 NOTE — Progress Notes (Signed)
Subjective: "im cold but i feel better". Denies pain/sob/discomfort.  Objective: Vital signs Filed Vitals:   01/22/11 0023 01/22/11 0138 01/22/11 0415 01/22/11 0613  BP: 167/51 159/65 182/69 183/65  Pulse: 65 61 66   Temp: 98.1 F (36.7 C) 98.2 F (36.8 C) 99 F (37.2 C)   TempSrc: Oral Oral Oral   Resp: 19 20 18    Height:  5\' 5"  (1.651 m)    Weight:  89.6 kg (197 lb 8.5 oz) 89.7 kg (197 lb 12 oz)   SpO2: 100% 96% 96%    Weight change:  Last BM Date: 01/21/11  Intake/Output from previous day: 01/10 0701 - 01/11 0700 In: 33 [P.O.:30; I.V.:3] Out: 1450 [Urine:1450] Total I/O In: 120 [P.O.:120] Out: -    Physical Exam: General: Alert, awake, oriented x3, in no acute distress. Up in chair.  HEENT: No bruits, no goiter. Mucus membranes pink Heart:  .Regular rate and rhythm, without murmurs, rubs, gallops. Lungs: Normal effort. Breath sounds somewhat diminished with fine crackles at bases.  Abdomen: obese, Soft, nontender, nondistended, positive bowel sounds. Extremities: No clubbing cyanosis with positive pedal pulses. 1+ LEE. Neuro: Grossly intact, nonfocal.    Lab Results: Basic Metabolic Panel:  Basename 01/22/11 0533 01/21/11 1537  NA 139 140  K 4.0 4.5  CL 103 104  CO2 28 26  GLUCOSE 135* 130*  BUN 49* 50*  CREATININE 2.66* 2.62*  CALCIUM 8.8 9.0  MG -- --  PHOS -- --   Liver Function Tests:  [facility] 01/21/11 1537  AST 14  ALT 8  ALKPHOS 85  BILITOT 0.4  PROT 7.0  ALBUMIN 3.1*   CBC:  Basename 01/21/11 1537 01/20/11 0521  WBC 6.6 4.6  NEUTROABS 5.1 --  HGB 8.8* 8.0*  HCT 27.9* 25.3*  MCV 72.8* 73.5*  PLT 214 177   Cardiac Enzymes:  Basename 01/22/11 0533 01/21/11 2220 01/21/11 1537  CKTOTAL 71 79 80  CKMB 3.0 3.1 3.1  CKMBINDEX -- -- --  TROPONINI <0.30 <0.30 <0.30   BNP:  Basename 01/21/11 1537 01/20/11 0521  PROBNP 15325.0* 12865.0*   D-Dimer:  Basename 01/21/11 2213  DDIMER 2.31*   CBG:  Basename 01/22/11 0808  01/22/11 0612 01/20/11 1134 01/20/11 0738 01/19/11 2123 01/19/11 1702  GLUCAP 151* 124* 108* 110* 155* 152*   Urinalysis:  Misc. Labs:  Recent Results (from the past 240 hour(s))  URINE CULTURE     Status: Normal   Collection Time   01/15/11  2:54 PM      Component Value Range Status Comment   Specimen Description URINE, CLEAN CATCH   Final    Special Requests NONE   Final    Setup Time [id number]   Final    Colony Count 10,000 COLONIES/ML   Final    Culture ENTEROCOCCUS SPECIES   Final    Report Status 01/19/2011 FINAL   Final    Organism ID, Bacteria ENTEROCOCCUS SPECIES   Final     Studies/Results: Dg Chest Port 1 View  01/21/2011  *RADIOLOGY REPORT*  Clinical Data: Shortness of breath.  PORTABLE CHEST - 1 VIEW  Comparison: Chest x-ray 01/19/2011.  Findings: The heart is enlarged but stable.  There is vascular congestion and mild interstitial pulmonary edema.  Small pleural effusions are noted along with left greater than right basilar atelectasis.  IMPRESSION: CHF.  Original Report Authenticated By: P. Kalman Jewels, M.D.    Medications: Scheduled Meds:   . amLODipine  10 mg Oral Daily  . calcitRIOL  0.25 mcg Oral Daily  . carvedilol  25 mg Oral BID WC  . citalopram  20 mg Oral Daily  . cloNIDine  0.2 mg Oral BID  . fluticasone  2 spray Each Nare Daily  . furosemide  40 mg Intravenous Once  . furosemide  40 mg Intravenous Q12H  . furosemide  80 mg Intravenous Once  . hydrALAZINE  100 mg Oral Q8H  . insulin aspart protamine-insulin aspart  5 Units Subcutaneous Q supper  . insulin aspart protamine-insulin aspart  8 Units Subcutaneous Q breakfast  . polysaccharide iron  150 mg Oral Daily  . potassium chloride  10 mEq Oral Daily  . simvastatin  10 mg Oral q1800  . sodium chloride  3 mL Intravenous Q12H  . DISCONTD: amLODipine  10 mg Oral Daily  . DISCONTD: cloNIDine  0.1 mg Oral BID  . DISCONTD: insulin aspart protamine-insulin aspart  5-8 Units Subcutaneous BID WC    Continuous Infusions:  PRN Meds:.sodium chloride, acetaminophen, hydrALAZINE, ondansetron (ZOFRAN) IV, sodium chloride  Assessment/Plan:  Principal Problem: CHF, acute on chronic with chest tightness: Improved. Echo done 1/7 yields EF 60-65%   -Continue IV Lasix 40 mg twice a day, Coreg. No ACE/ARB secondary to renal insufficiency. Patient is having frequent CHF exacerbation/volume overload, she has chronic kidney disease stage V with AV fistula. Cardiology and renal service  to be consulted for comanagement of CHF and CKD, question if patient needs to be on dialysis now.   Marland KitchenDIABETES MELLITUS, CONTROLLED, WITH COMPLICATIONS:  - Hg 123456 7.4 2 weeks agon. Continue SSI   .HYPERLIPIDEMIA: Obtain lipid panel   .ANEMIA OF RENAL FAILURE: Patient on iron supplementation, continue   .ANXIETY: Stable   .HYPERTENSION: BP likely elevated due to missing her medication doses. Antihypertensives restarted. Fair control. Continue to monitor.    .CKD (chronic kidney disease) stage 5, GFR less than 15 ml/min : Has left AV fistula. Renal consult requested.   DVT prophylaxis: SCDs     [facility] M 01/22/2011, 10:41 AM  I have personally examined the patient and reviewed the entire database. Agree with the above note and plan as outlined by Ms. Renard Hamper, NP.  Estill Cotta M.D. [facility] 01/22/2011, 12:52 PM

## 2011-01-22 NOTE — Progress Notes (Signed)
Came to see patient in consult but she believes she has been a patient of Dr. Kennon Holter. [facility] saw the patient in 2010 & [id number] for diastolic CHF. I have notified Cecilie Kicks, NP with their service and they will come to see patient.  [name] [facility] Cardiology

## 2011-01-22 NOTE — ED Notes (Signed)
ICU would like the [patient 's room changed to tele rather than stepdown and wants [place] to notify MD

## 2011-01-22 NOTE — Consult Note (Signed)
Reason for Consult: assistance to manage CHF   Referring Physician: [facility]  Cardiologist: Dr. Debara Pickett  At [facility] and [facility]  [name] is an 78 y.o. female.    Chief Complaint:  Shortness of breath  HPI: Patient is 78 year old [place] female with multiple medical problems including diabetes mellitus, hypertension, history of prior stroke with residual right-sided weakness, history of chronic kidney disease stage V with left AV fistula but not on hemodialysis yet, presented to [facility] emergency room with shortness of breath that started 01/21/11. History was obtained from the patient and her daughter-in-law present in the room. Per patient, she was just discharged from [facility] yesterday. Per patient she had felt better yesterday however she missed all her medications in the evening. This morning her caregiver noted that she was more short of breath and also complaint of chest pain. Patient stated that the chest pain was chest tightness, midsternal, 8/10 intensity with no radiation and difficulty breathing. In the ER the chest pain had completely resolved. Patient had no fever or chills or any wheezing, palpitations, no nausea or vomiting.   Since admit neg cardiac enzymes.  EKG without acute changes. 2 short bursts of SVT this admit.   (no history of cardiac cath that I can locate) Negative nuclear stress test 2006, though did brady to a junctional rhythm during dobutamine stress portion.     She was diagnosed with acute on chronic diastolic heart failure, with associated chest tightness and admitted to diuresis. She had just been hospitalized [id number] for Diastolic heart failure She also has CKD, stage 5, followed by Dr Hinda Lenis in [place].    She has lung disease on chronic home oxygen.   Dr. Jimmy Footman has seen the pt. In consult for Renal and continues plan to diuresis.  2D Echo 01/18/11:   - Left ventricle: The cavity size was normal.  Wall thickness was increased in a pattern of mild LVH. Systolic function was normal. The estimated ejection fraction was in the range of 60% to 65%. Wall motion was normal; there were no regional wall motion abnormalities. Features are consistent with a pseudonormal left ventricular filling pattern, with concomitant abnormal relaxation and increased filling pressure (grade 2 diastolic dysfunction). Doppler parameters are consistent with elevated mean left atrial filling pressure. - Mitral valve: Mild regurgitation. - Left atrium: The atrium was mildly dilated. - Atrial septum: No defect or patent foramen ovale was identified. - Pulmonary arteries: Systolic pressure was mildly increased. PA peak pressure: 37mm Hg (S).  Other history as below.   Past Medical History  Diagnosis Date  . Anxiety   . Diabetes mellitus, type 2   . Hypertension   . Stroke     residual right side weakness and pain  . IBS (irritable bowel syndrome)     with costipation  . Overactive bladder   . Cataracts, both eyes   . Gynecologic cancer   . Hyperlipidemia   . CKD (chronic kidney disease), stage V   . Anemia in CKD (chronic kidney disease)   . SVT (supraventricular tachycardia)   . CHF (congestive heart failure)   . Arthritis     gout  . SOB (shortness of breath)     nml spirometry 09/05/06    Past Surgical History  Procedure Date  . Vesicovaginal fistula closure w/ tah   . Av fistula placement, brachiocephalic [id number]    left arm   by Dr. Bridgett Larsson  . Tee without cardioversion 2011  2/2 Strep Bovis infection    History reviewed. No pertinent family history. Social History:  reports that she has never smoked. She has never used smokeless tobacco. She reports that she does not drink alcohol or use illicit drugs.  Allergies: No Known Allergies  Medications Prior to Admission  Medication Dose Route Frequency Provider Last Rate Last Dose  . 0.9 %  sodium chloride infusion  250 mL  Intravenous PRN Ripudeep K Rai, MD      . acetaminophen (TYLENOL) tablet 650 mg  650 mg Oral Q4H PRN Ripudeep K Rai, MD      . amLODipine (NORVASC) tablet 10 mg  10 mg Oral Daily Mary A. Lynch      . calcitRIOL (ROCALTROL) capsule 0.25 mcg  0.25 mcg Oral Daily Ripudeep K Rai, MD   0.25 mcg at 01/22/11 0945  . carvedilol (COREG) tablet 25 mg  25 mg Oral BID WC Ripudeep K Rai, MD   25 mg at 01/22/11 0945  . citalopram (CELEXA) tablet 20 mg  20 mg Oral Daily Ripudeep K Rai, MD   20 mg at 01/22/11 0945  . cloNIDine (CATAPRES) tablet 0.2 mg  0.2 mg Oral BID Ripudeep Krystal Eaton, MD   0.2 mg at 01/22/11 0944  . fluticasone (FLONASE) 50 MCG/ACT nasal spray 2 spray  2 spray Each Nare Daily Ripudeep K Rai, MD   2 spray at 01/22/11 0945  . furosemide (LASIX) injection 40 mg  40 mg Intravenous Once Ripudeep Krystal Eaton, MD   40 mg at 01/21/11 2002  . furosemide (LASIX) injection 80 mg  80 mg Intravenous Once Veryl Speak, MD   80 mg at 01/21/11 1606  . furosemide (LASIX) tablet 160 mg  160 mg Oral BID Placido Sou, MD      . hydrALAZINE (APRESOLINE) injection 10 mg  10 mg Intravenous Q6H PRN Ripudeep Krystal Eaton, MD   10 mg at 01/22/11 0443  . hydrALAZINE (APRESOLINE) tablet 100 mg  100 mg Oral Q8H Ripudeep K Rai, MD   100 mg at 01/22/11 1337  . insulin aspart protamine-insulin aspart (NOVOLOG 70/30) injection 5 Units  5 Units Subcutaneous Q supper Lolita Patella, PHARMD      . insulin aspart protamine-insulin aspart (NOVOLOG 70/30) injection 8 Units  8 Units Subcutaneous Q breakfast Lolita Patella, [place]   8 Units at 01/22/11 0946  . ondansetron (ZOFRAN) injection 4 mg  4 mg Intravenous Q6H PRN Ripudeep K Rai, MD      . potassium chloride (K-DUR) CR tablet 10 mEq  10 mEq Oral Daily Ripudeep Krystal Eaton, MD   10 mEq at 01/22/11 0945  . simvastatin (ZOCOR) tablet 10 mg  10 mg Oral q1800 Ripudeep K Rai, MD      . sodium chloride 0.9 % injection 3 mL  3 mL Intravenous Q12H Ripudeep Krystal Eaton, MD   3 mL at 01/22/11  0949  . sodium chloride 0.9 % injection 3 mL  3 mL Intravenous PRN Ripudeep K Rai, MD      . technetium albumin aggregated (MAA) injection solution 6 milli Curie  6 milli Curie Intravenous Once PRN Medication Radiologist   5.5 milli Curie at 01/22/11 1315  . xenon xe 133 gas 5 milli Curie  5 milli Curie Inhalation Once PRN Medication Radiologist   5.4 milli Curie at 01/22/11 1200  . DISCONTD: 0.9 %  sodium chloride infusion  250 mL Intravenous PRN Rachal A David      . DISCONTD: albuterol (  PROVENTIL HFA;VENTOLIN HFA) 108 (90 BASE) MCG/ACT inhaler 2 puff  2 puff Inhalation Q2H PRN Rachal A David      . DISCONTD: amLODipine (NORVASC) tablet 10 mg  10 mg Oral Daily Rachal A David   10 mg at 01/20/11 1124  . DISCONTD: amLODipine (NORVASC) tablet 10 mg  10 mg Oral Daily Ripudeep K Rai, MD      . DISCONTD: antiseptic oral rinse (BIOTENE) solution 15 mL  15 mL Mouth Rinse BID Rachal A David   15 mL at 01/20/11 0800  . DISCONTD: aspirin EC tablet 81 mg  81 mg Oral Daily Rachal A David   81 mg at 01/20/11 1124  . DISCONTD: calcitRIOL (ROCALTROL) capsule 0.25 mcg  0.25 mcg Oral Daily Rachal A David   0.25 mcg at 01/20/11 1123  . DISCONTD: carvedilol (COREG) tablet 25 mg  25 mg Oral BID WC Rachal A David   25 mg at 01/20/11 0851  . DISCONTD: cefUROXime (CEFTIN) tablet 250 mg  250 mg Oral BID Rachal A David   250 mg at 01/20/11 1124  . DISCONTD: citalopram (CELEXA) tablet 20 mg  20 mg Oral Daily Rachal A David   20 mg at 01/20/11 1124  . DISCONTD: cloNIDine (CATAPRES) tablet 0.1 mg  0.1 mg Oral BID Ripudeep K Rai, MD   0.1 mg at 01/21/11 2336  . DISCONTD: cloNIDine (CATAPRES) tablet 0.2 mg  0.2 mg Oral BID Nimish C Gosrani   0.2 mg at 01/20/11 1124  . DISCONTD: feeding supplement (GLUCERNA SHAKE) liquid 237 mL  237 mL Oral Daily Rachal A David   237 mL at 01/20/11 1000  . DISCONTD: fluticasone (FLONASE) 50 MCG/ACT nasal spray 2 spray  2 spray Each Nare Daily Rachal A David   2 spray at 01/20/11 1123  .  DISCONTD: furosemide (LASIX) injection 40 mg  40 mg Intravenous Q12H Ripudeep K Rai, MD      . DISCONTD: furosemide (LASIX) tablet 80 mg  80 mg Oral BID Nimish C Gosrani   80 mg at 01/20/11 0851  . DISCONTD: guanFACINE (TENEX) tablet 2 mg  2 mg Oral QHS Rachal A David   2 mg at 01/19/11 2306  . DISCONTD: hydrALAZINE (APRESOLINE) tablet 100 mg  100 mg Oral Q8H Rachal A David   100 mg at 01/20/11 0631  . DISCONTD: insulin aspart protamine-insulin aspart (NOVOLOG 70/30) injection 4 Units  4 Units Subcutaneous BID WC Nimish C Gosrani   4 Units at 01/20/11 0850  . DISCONTD: insulin aspart protamine-insulin aspart (NOVOLOG 70/30) injection 5-8 Units  5-8 Units Subcutaneous BID WC Ripudeep K Rai, MD      . DISCONTD: polysaccharide iron (NIFEREX) capsule 150 mg  150 mg Oral Daily Ripudeep K Rai, MD   150 mg at 01/22/11 0945  . DISCONTD: potassium chloride (K-DUR) CR tablet 10 mEq  10 mEq Oral Daily Rachal A David   10 mEq at 01/20/11 1124  . DISCONTD: simvastatin (ZOCOR) tablet 10 mg  10 mg Oral q1800 Rachal A David   10 mg at 01/19/11 1829  . DISCONTD: sodium chloride 0.9 % injection 3 mL  3 mL Intravenous Q12H Rachal A David   3 mL at 01/19/11 2244  . DISCONTD: sodium chloride 0.9 % injection 3 mL  3 mL Intravenous PRN Rachal A David       Medications Prior to Admission  Medication Sig Dispense Refill  . albuterol (PROVENTIL HFA;VENTOLIN HFA) 108 (90 BASE) MCG/ACT inhaler Inhale 2 puffs into the  lungs every 2 (two) hours as needed. Shortness of breath      . amLODipine (NORVASC) 10 MG tablet Take 10 mg by mouth every morning.        . calcitRIOL (ROCALTROL) 0.25 MCG capsule Take 0.25 mcg by mouth every morning.       . Carboxymethylcellul-Glycerin (OPTIVE) 0.5-0.9 % SOLN Apply to eye. One drop in both eyes two times a day        . carvedilol (COREG) 25 MG tablet Take 1 tablet (25 mg total) by mouth 2 (two) times daily with a meal.  60 tablet  1  . citalopram (CELEXA) 20 MG tablet Take 20 mg by mouth  every morning.        . cloNIDine (CATAPRES) 0.1 MG tablet Take 0.1 mg by mouth 2 (two) times daily.      . feeding supplement (GLUCERNA SHAKE) LIQD Take 237 mLs by mouth daily.        . fluticasone (FLONASE) 50 MCG/ACT nasal spray 2 sprays by Nasal route daily.        . furosemide (LASIX) 80 MG tablet Take 1 tablet (80 mg total) by mouth 2 (two) times daily.  60 tablet  1  . hydrALAZINE (APRESOLINE) 100 MG tablet Take 1 tablet (100 mg total) by mouth every 8 (eight) hours.  90 tablet  1  . insulin aspart protamine-insulin aspart (NOVOLOG 70/30) (70-30) 100 UNIT/ML injection Inject 5-8 Units into the skin 2 (two) times daily with a meal. Inject 8 units every morning and inject 5 units every evening      . iron polysaccharides (POLY-IRON 150) 150 MG capsule Take 150 mg by mouth every morning.       . potassium chloride (K-DUR) 10 MEQ tablet Take 1 tablet (10 mEq total) by mouth daily. Take with your evening dose of lasix  30 tablet  6  . TENEX 2 MG tablet TAKE ONE TABLET BY MOUTH AT BEDTIME.  30 each  3  . ZOCOR 10 MG tablet TAKE (1) TABLET BY MOUTH IN THE EVENING.  30 each  3    Results for orders placed during the hospital encounter of 01/21/11 (from the past 48 hour(s))  CBC     Status: Abnormal   Collection Time   01/21/11  3:37 PM      Component Value Range Comment   WBC 6.6  4.0 - 10.5 (K/uL)    RBC 3.83 (*) 3.87 - 5.11 (MIL/uL)    Hemoglobin 8.8 (*) 12.0 - 15.0 (g/dL)    HCT 27.9 (*) 36.0 - 46.0 (%)    MCV 72.8 (*) 78.0 - 100.0 (fL)    MCH 23.0 (*) 26.0 - 34.0 (pg)    MCHC 31.5  30.0 - 36.0 (g/dL)    RDW 16.5 (*) 11.5 - 15.5 (%)    Platelets 214  150 - 400 (K/uL)   DIFFERENTIAL     Status: Normal   Collection Time   01/21/11  3:37 PM      Component Value Range Comment   Neutrophils Relative 77  43 - 77 (%)    Neutro Abs 5.1  1.7 - 7.7 (K/uL)    Lymphocytes Relative 14  12 - 46 (%)    Lymphs Abs 1.0  0.7 - 4.0 (K/uL)    Monocytes Relative 7  3 - 12 (%)    Monocytes Absolute 0.5   0.1 - 1.0 (K/uL)    Eosinophils Relative 1  0 - 5 (%)  Eosinophils Absolute 0.0  0.0 - 0.7 (K/uL)    Basophils Relative 0  0 - 1 (%)    Basophils Absolute 0.0  0.0 - 0.1 (K/uL)   COMPREHENSIVE METABOLIC PANEL     Status: Abnormal   Collection Time   01/21/11  3:37 PM      Component Value Range Comment   Sodium 140  135 - 145 (mEq/L)    Potassium 4.5  3.5 - 5.1 (mEq/L)    Chloride 104  96 - 112 (mEq/L)    CO2 26  19 - 32 (mEq/L)    Glucose, Bld 130 (*) 70 - 99 (mg/dL)    BUN 50 (*) 6 - 23 (mg/dL)    Creatinine, Ser 2.62 (*) 0.50 - 1.10 (mg/dL)    Calcium 9.0  8.4 - 10.5 (mg/dL)    Total Protein 7.0  6.0 - 8.3 (g/dL)    Albumin 3.1 (*) 3.5 - 5.2 (g/dL)    AST 14  0 - 37 (U/L)    ALT 8  0 - 35 (U/L)    Alkaline Phosphatase 85  39 - 117 (U/L)    Total Bilirubin 0.4  0.3 - 1.2 (mg/dL)    GFR calc non Af Amer 16 (*) >90 (mL/min)    GFR calc Af Amer 18 (*) >90 (mL/min)   CARDIAC PANEL(CRET KIN+CKTOT+MB+TROPI)     Status: Normal   Collection Time   01/21/11  3:37 PM      Component Value Range Comment   Total CK 80  7 - 177 (U/L)    CK, MB 3.1  0.3 - 4.0 (ng/mL)    Troponin I <0.30  <0.30 (ng/mL)    Relative Index RELATIVE INDEX IS INVALID  0.0 - 2.5    PRO B NATRIURETIC PEPTIDE     Status: Abnormal   Collection Time   01/21/11  3:37 PM      Component Value Range Comment   Pro B Natriuretic peptide (BNP) 15325.0 (*) 0 - 450 (pg/mL)   D-DIMER, QUANTITATIVE     Status: Abnormal   Collection Time   01/21/11 10:13 PM      Component Value Range Comment   D-Dimer, Quant 2.31 (*) 0.00 - 0.48 (ug/mL-FEU)   CARDIAC PANEL(CRET KIN+CKTOT+MB+TROPI)     Status: Normal   Collection Time   01/21/11 10:20 PM      Component Value Range Comment   Total CK 79  7 - 177 (U/L)    CK, MB 3.1  0.3 - 4.0 (ng/mL)    Troponin I <0.30  <0.30 (ng/mL)    Relative Index RELATIVE INDEX IS INVALID  0.0 - 2.5    CARDIAC PANEL(CRET KIN+CKTOT+MB+TROPI)     Status: Normal   Collection Time   01/22/11  5:33 AM       Component Value Range Comment   Total CK 71  7 - 177 (U/L)    CK, MB 3.0  0.3 - 4.0 (ng/mL)    Troponin I <0.30  <0.30 (ng/mL)    Relative Index RELATIVE INDEX IS INVALID  0.0 - 2.5    BASIC METABOLIC PANEL     Status: Abnormal   Collection Time   01/22/11  5:33 AM      Component Value Range Comment   Sodium 139  135 - 145 (mEq/L)    Potassium 4.0  3.5 - 5.1 (mEq/L)    Chloride 103  96 - 112 (mEq/L)    CO2 28  19 - 32 (mEq/L)  Glucose, Bld 135 (*) 70 - 99 (mg/dL)    BUN 49 (*) 6 - 23 (mg/dL)    Creatinine, Ser 2.66 (*) 0.50 - 1.10 (mg/dL)    Calcium 8.8  8.4 - 10.5 (mg/dL)    GFR calc non Af Amer 16 (*) >90 (mL/min)    GFR calc Af Amer 18 (*) >90 (mL/min)   GLUCOSE, CAPILLARY     Status: Abnormal   Collection Time   01/22/11  6:12 AM      Component Value Range Comment   Glucose-Capillary 124 (*) 70 - 99 (mg/dL)    Comment 1 Documented in Chart      Comment 2 Notify RN     GLUCOSE, CAPILLARY     Status: Abnormal   Collection Time   01/22/11  8:08 AM      Component Value Range Comment   Glucose-Capillary 151 (*) 70 - 99 (mg/dL)   CARDIAC PANEL(CRET KIN+CKTOT+MB+TROPI)     Status: Normal   Collection Time   01/22/11 11:20 AM      Component Value Range Comment   Total CK 66  7 - 177 (U/L)    CK, MB 3.0  0.3 - 4.0 (ng/mL)    Troponin I <0.30  <0.30 (ng/mL)    Relative Index RELATIVE INDEX IS INVALID  0.0 - 2.5    URINALYSIS, ROUTINE W REFLEX MICROSCOPIC     Status: Abnormal   Collection Time   01/22/11  1:47 PM      Component Value Range Comment   Color, Urine YELLOW  YELLOW     APPearance CLEAR  CLEAR     Specific Gravity, Urine 1.014  1.005 - 1.030     pH 5.5  5.0 - 8.0     Glucose, UA NEGATIVE  NEGATIVE (mg/dL)    Hgb urine dipstick NEGATIVE  NEGATIVE     Bilirubin Urine NEGATIVE  NEGATIVE     Ketones, ur NEGATIVE  NEGATIVE (mg/dL)    Protein, ur >300 (*) NEGATIVE (mg/dL)    Urobilinogen, UA 1.0  0.0 - 1.0 (mg/dL)    Nitrite NEGATIVE  NEGATIVE     Leukocytes, UA  TRACE (*) NEGATIVE    URINE MICROSCOPIC-ADD ON     Status: Abnormal   Collection Time   01/22/11  1:47 PM      Component Value Range Comment   Squamous Epithelial / LPF RARE  RARE     WBC, UA 3-6  <3 (WBC/hpf)    Bacteria, UA RARE  RARE     Casts HYALINE CASTS (*) NEGATIVE     Nm Pulmonary Per & Vent  01/22/2011  *RADIOLOGY REPORT*  Clinical Data:  Shortness of breath and elevated D-dimer.  NUCLEAR MEDICINE VENTILATION - PERFUSION LUNG SCAN  Technique:  Wash-in, equilibrium, and wash-out phase ventilation images were obtained using Xe-133 gas.  Perfusion images were obtained in multiple projections after intravenous injection of Tc- 42m MAA.  Radiopharmaceuticals:  5.4 mCi Xe-133 gas and 5.5 mCi Tc-65m MAA.  Comparison:  Chest radiograph 01/21/2011  Findings: The perfusion images are essentially normal.  There are no peripheral or large perfusion abnormalities.  There is no evidence for a ventilation-perfusion mismatch.  There may be decreased ventilation at the left lung base compared to the perfusion.  IMPRESSION: Very low probability for pulmonary embolism.  Original Report Authenticated By: Markus Daft, M.D.   Dg Chest Port 1 View  01/21/2011  *RADIOLOGY REPORT*  Clinical Data: Shortness of breath.  PORTABLE CHEST -  1 VIEW  Comparison: Chest x-ray 01/19/2011.  Findings: The heart is enlarged but stable.  There is vascular congestion and mild interstitial pulmonary edema.  Small pleural effusions are noted along with left greater Wood right basilar atelectasis.  IMPRESSION: CHF.  Original Report Authenticated By: P. Kalman Jewels, M.D.    ROS: General:No colds or fevers Skin:no rashes HEENT:denies blurred vision CV:+ chest pain PUL:+SOB GI:no diarrhea GU:no complaints MS:no complaints NEURO:no syncope Endo:+ diabetes   Blood pressure 164/69, pulse 60, temperature 98.5 F (36.9 C), temperature source Oral, resp. rate 18, height 5\' 5"  (1.651 m), weight 89.7 kg (197 lb 12 oz), last  menstrual period 12/24/2010, SpO2 99.00%. PE: General:A&O X 3, MAE, no acute distress, pleasant affect Skin:W&D brisk capillary refill HEENT:normocephalic, sclera clear Neck:supple, no JVD no bruits [id number] RRR  Soft systolic murmur. Lungs:few rales in the bases Abd:+ BS, soft, nontender Ext:tr. Edema. Neuro:A& O X 3, MAE, follows commands, but poor memory     Assessment/Plan Patient Active Problem List  Diagnoses  . DIABETES MELLITUS, CONTROLLED, WITH COMPLICATIONS  . HYPERLIPIDEMIA  . ANEMIA OF RENAL FAILURE  . ANXIETY  . CATARACT NOS  . HYPERTENSION  . CONGESTIVE HEART FAILURE UNSPECIFIED  . ALLERGIC RHINITIS  . IBS  . Chronic kidney disease, stage 4, severely decreased GFR  . ARTHRITIS  . DEPENDENT EDEMA, LEGS, BILATERAL  . DYSPNEA ON EXERTION  . CEREBROVASCULAR ACCIDENT, HX OF  . Arm bruise  . Colon polyps  . Insomnia  . Fall  . Constipation  . Altered level of consciousness  . Chronic respiratory failure  . Nausea & vomiting  . SOB (shortness of breath)  . CKD (chronic kidney disease) stage 5, GFR less Wood 15 ml/min  . CHF, acute on chronic   PLAN: As outpatient was on lasix 80 mg. BID, but she did not take evening meds the one night she was at home.  BP on admit 179/62. BNP on admit 15,325 ;; @ discharge from [facility]  Currently on lasix 160 mg. Bid.  -[id number] yest.  Weight without change.  Cardiac enzymes are neg. And were neg.  at [facility].  Possibility of Ischemia mediated CHF in combination with diastolic heart failure, but with CKD would be difficult to cath if nuc. Study were positive.  Would add IMDUR to medications.  MD to evaluate.  SVT:  On coreg 25 BID, could increase, vs. Changing norvasc to cardizem.   INGOLD,LAURA R 01/22/2011, 4:47 PM    I have seen and examined the patient along with [facility] R, NP.  I have reviewed the chart, notes and new data.  I agree with NP's note.  Key new complaints: dyspnea improved, but still  orthopneic  Key examination changes: reduced breath sounds in both bases, no moist rales Key new findings / data: Note that in July 2012 her BNP was lower (around 9000) and her creatinine higher (3.9-4). I suspect that she was closer to euvolemic status then and that her serum creatinine today is not reflective of true renal function.  Also note weight of 191 in our office on 12/16/10 versus nearly 198 today (albeit on a different scale). This is on a background of steady weight loss over the last year. She is clearly still hypervolemic.  May need to add thiazide (metolazone 5 mg daily) temporarily to achieve necessary diuresis, but will not be able to achieve resolution of dyspnea/pleural effusion until we allow her creatinine to rise substantially.  I think she is  closer to needing hemodialysis Wood previously appreciated.  I think the atrial tachycardia is an expression of high filling pressures and atrial dilatation and not a problem in and of itself, nor a target for specific therapy. I would not add diltiazem or increase beta blocker further at this time. Not sinus rate around 60 bpm.  PLAN: IV loop diuretics. Add metolazone if diuretic response is poor. Shoot for weight around 190, BNP under 10000, allow creat up to 4.  Sanda Klein, MD, [place] [phone] 01/22/2011, 7:07 PM

## 2011-01-23 LAB — ABO/RH: ABO/RH(D): A POS

## 2011-01-23 LAB — TYPE AND SCREEN
ABO/RH(D): A POS
Antibody Screen: NEGATIVE

## 2011-01-23 LAB — DIFFERENTIAL
Basophils Absolute: 0 10*3/uL (ref 0.0–0.1)
Basophils Relative: 1 % (ref 0–1)
Eosinophils Relative: 1 % (ref 0–5)
Monocytes Absolute: 0.5 10*3/uL (ref 0.1–1.0)
Monocytes Relative: 11 % (ref 3–12)
Neutro Abs: 3.1 10*3/uL (ref 1.7–7.7)

## 2011-01-23 LAB — COMPREHENSIVE METABOLIC PANEL
Albumin: 2.6 g/dL — ABNORMAL LOW (ref 3.5–5.2)
Alkaline Phosphatase: 71 U/L (ref 39–117)
BUN: 46 mg/dL — ABNORMAL HIGH (ref 6–23)
Chloride: 103 mEq/L (ref 96–112)
Creatinine, Ser: 2.78 mg/dL — ABNORMAL HIGH (ref 0.50–1.10)
GFR calc Af Amer: 17 mL/min — ABNORMAL LOW (ref >90)
Glucose, Bld: 83 mg/dL (ref 70–99)
Potassium: 4.2 mEq/L (ref 3.5–5.1)
Total Bilirubin: 0.4 mg/dL (ref 0.3–1.2)
Total Protein: 5.9 g/dL — ABNORMAL LOW (ref 6.0–8.3)

## 2011-01-23 LAB — CBC
HCT: 24.8 % — ABNORMAL LOW (ref 36.0–46.0)
Hemoglobin: 7.9 g/dL — ABNORMAL LOW (ref 12.0–15.0)
MCHC: 31.9 g/dL (ref 30.0–36.0)
MCV: 72.7 fL — ABNORMAL LOW (ref 78.0–100.0)
RDW: 16.7 % — ABNORMAL HIGH (ref 11.5–15.5)

## 2011-01-23 LAB — IRON AND TIBC
Iron: 40 ug/dL — ABNORMAL LOW (ref 42–135)
Saturation Ratios: 24 % (ref 20–55)
TIBC: 169 ug/dL — ABNORMAL LOW (ref 250–470)
UIBC: 129 ug/dL (ref 125–400)

## 2011-01-23 LAB — URINE CULTURE

## 2011-01-23 LAB — T3, FREE: T3, Free: 2.2 pg/mL — ABNORMAL LOW (ref 2.3–4.2)

## 2011-01-23 LAB — GLUCOSE, CAPILLARY
Glucose-Capillary: 93 mg/dL (ref 70–99)
Glucose-Capillary: 89 mg/dL (ref 70–99)

## 2011-01-23 LAB — TSH: TSH: 1.81 u[IU]/mL (ref 0.350–4.500)

## 2011-01-23 LAB — T4, FREE: Free T4: 1.26 ng/dL (ref 0.80–1.80)

## 2011-01-23 MED ORDER — FERUMOXYTOL INJECTION 510 MG/17 ML
510.0000 mg | Freq: Once | INTRAVENOUS | Status: AC
  Administered 2011-01-23: 510 mg via INTRAVENOUS
  Filled 2011-01-23: qty 17

## 2011-01-23 MED ORDER — DARBEPOETIN ALFA-POLYSORBATE 200 MCG/0.4ML IJ SOLN
200.0000 ug | INTRAMUSCULAR | Status: DC
  Administered 2011-01-23: 200 ug via SUBCUTANEOUS
  Filled 2011-01-23: qty 0.4

## 2011-01-23 NOTE — Progress Notes (Signed)
Subjective: Interval History: none.  Objective: Vital signs in last 24 hours: Temp:  [98.5 F (36.9 C)-98.8 F (37.1 C)] 98.8 F (37.1 C) (01/12 0619) Pulse Rate:  [52-68] 68  (01/12 0619) Resp:  [18] 18  (01/12 0619) BP: (162-164)/(64-73) 164/64 mmHg (01/12 0619) SpO2:  [99 %-100 %] 100 % (01/12 0619) Weight:  [89.7 kg (197 lb 12 oz)] 89.7 kg (197 lb 12 oz) (01/12 0619) Weight change: 0.1 kg (3.5 oz)  Intake/Output from previous day: 01/11 0701 - 01/12 0700 In: 480 [P.O.:480] Out: 2050 [Urine:2050] Intake/Output this shift:    General appearance: alert, cooperative and no distress Resp: diminished breath sounds bilaterally and rales bibasilar Cardio: S1, S2 normal and systolic murmur: holosystolic 2/6, blowing at apex GI: pos bs, soft, liver down 5 cm Extremities: edema 2 plus, AVF LUA and AVF  Lab Results:  [facility] 01/23/11 0609 01/21/11 1537  WBC 4.6 6.6  HGB 7.9* 8.8*  HCT 24.8* 27.9*  PLT 177 214   BMET:  Basename 01/23/11 0609 01/22/11 0533  NA 139 139  K 4.2 4.0  CL 103 103  CO2 29 28  GLUCOSE 83 135*  BUN 46* 49*  CREATININE 2.78* 2.66*  CALCIUM 8.6 8.8   No results found for this basename: PTH:2 in the last 72 hours Iron Studies:  Basename 01/22/11 1511  IRON 40*  TIBC 169*  TRANSFERRIN --  FERRITIN --    Studies/Results: Nm Pulmonary Per & Vent  01/22/2011  *RADIOLOGY REPORT*  Clinical Data:  Shortness of breath and elevated D-dimer.  NUCLEAR MEDICINE VENTILATION - PERFUSION LUNG SCAN  Technique:  Wash-in, equilibrium, and wash-out phase ventilation images were obtained using Xe-133 gas.  Perfusion images were obtained in multiple projections after intravenous injection of Tc- 82m MAA.  Radiopharmaceuticals:  5.4 mCi Xe-133 gas and 5.5 mCi Tc-60m MAA.  Comparison:  Chest radiograph 01/21/2011  Findings: The perfusion images are essentially normal.  There are no peripheral or large perfusion abnormalities.  There is no evidence for a  ventilation-perfusion mismatch.  There may be decreased ventilation at the left lung base compared to the perfusion.  IMPRESSION: Very low probability for pulmonary embolism.  Original Report Authenticated By: Markus Daft, M.D.   Dg Chest Port 1 View  01/21/2011  *RADIOLOGY REPORT*  Clinical Data: Shortness of breath.  PORTABLE CHEST - 1 VIEW  Comparison: Chest x-ray 01/19/2011.  Findings: The heart is enlarged but stable.  There is vascular congestion and mild interstitial pulmonary edema.  Small pleural effusions are noted along with left greater than right basilar atelectasis.  IMPRESSION: CHF.  Original Report Authenticated By: P. Kalman Jewels, M.D.    I have reviewed the patient's current medications.  Assessment/Plan: 1 CKD 4 - 5 diuresing on po lasix, cont current dose.  Can D/C home  In 1 -2 days 2 Anemia needs EPO/Fe 3 HPTH P 4 Malnutrition 5 Debill  A large issue P lasix, epo/fe        epo/Fe    LOS: 2 days   Takiesha Mcdevitt L 01/23/2011,8:07 AM

## 2011-01-23 NOTE — Progress Notes (Signed)
[facility]  Subjective: Feels better, resting  Objective: Vital signs in last 24 hours: Temp:  [98.5 F (36.9 C)-98.8 F (37.1 C)] 98.8 F (37.1 C) (01/12 0619) Pulse Rate:  [52-68] 68  (01/12 0619) Resp:  [18] 18  (01/12 0619) BP: (162-164)/(64-73) 164/64 mmHg (01/12 0619) SpO2:  [99 %-100 %] 100 % (01/12 0619) Weight:  [89.7 kg (197 lb 12 oz)] 89.7 kg (197 lb 12 oz) (01/12 0619) Last BM Date: 01/21/11  Intake/Output from previous day: 01/11 0701 - 01/12 0700 In: 480 [P.O.:480] Out: 2050 [Urine:2050] Intake/Output this shift: Total I/O In: 300 [P.O.:300] Out: 1 [Stool:1]  Medications Current Facility-Administered Medications  Medication Dose Route Frequency Provider Last Rate Last Dose  . 0.9 %  sodium chloride infusion  250 mL Intravenous PRN Ripudeep K Rai, MD      . acetaminophen (TYLENOL) tablet 650 mg  650 mg Oral Q4H PRN Ripudeep K Rai, MD      . amLODipine (NORVASC) tablet 10 mg  10 mg Oral Daily Mary A. Lynch   10 mg at 01/23/11 1011  . calcitRIOL (ROCALTROL) capsule 0.25 mcg  0.25 mcg Oral Daily Ripudeep K Rai, MD   0.25 mcg at 01/23/11 1011  . carvedilol (COREG) tablet 25 mg  25 mg Oral BID WC Ripudeep Krystal Eaton, MD   25 mg at 01/23/11 0834  . citalopram (CELEXA) tablet 20 mg  20 mg Oral Daily Ripudeep K Rai, MD   20 mg at 01/23/11 1011  . cloNIDine (CATAPRES) tablet 0.2 mg  0.2 mg Oral BID Ripudeep K Rai, MD   0.2 mg at 01/23/11 1011  . darbepoetin (ARANESP) injection 200 mcg  200 mcg Subcutaneous Weekly Placido Sou, MD   200 mcg at 01/23/11 1021  . ferumoxytol [facility]) injection 510 mg  510 mg Intravenous Once Placido Sou, MD   510 mg at 01/23/11 1013  . fluticasone (FLONASE) 50 MCG/ACT nasal spray 2 spray  2 spray Each Nare Daily Ripudeep K Rai, MD   2 spray at 01/23/11 1010  . furosemide (LASIX) tablet 160 mg  160 mg Oral BID Placido Sou, MD   160 mg at 01/23/11 0835  . hydrALAZINE (APRESOLINE) injection 10 mg   10 mg Intravenous Q6H PRN Ripudeep Krystal Eaton, MD   10 mg at 01/22/11 0443  . hydrALAZINE (APRESOLINE) tablet 100 mg  100 mg Oral Q8H Ripudeep K Rai, MD   100 mg at 01/23/11 0600  . insulin aspart protamine-insulin aspart (NOVOLOG 70/30) injection 5 Units  5 Units Subcutaneous Q supper Lolita Patella, PHARMD   5 Units at 01/22/11 1824  . insulin aspart protamine-insulin aspart (NOVOLOG 70/30) injection 8 Units  8 Units Subcutaneous Q breakfast Lolita Patella, [place]   8 Units at 01/23/11 1023  . isosorbide mononitrate (IMDUR) 24 hr tablet 30 mg  30 mg Oral Daily Isaiah Serge, NP   30 mg at 01/23/11 1011  . ondansetron (ZOFRAN) injection 4 mg  4 mg Intravenous Q6H PRN Ripudeep K Rai, MD      . potassium chloride (K-DUR) CR tablet 10 mEq  10 mEq Oral Daily Ripudeep K Rai, MD   10 mEq at 01/23/11 1011  . simvastatin (ZOCOR) tablet 10 mg  10 mg Oral q1800 Ripudeep Krystal Eaton, MD   10 mg at 01/22/11 1824  . sodium chloride 0.9 % injection 3 mL  3 mL Intravenous Q12H Ripudeep Krystal Eaton, MD   3 mL  at 01/23/11 1013  . sodium chloride 0.9 % injection 3 mL  3 mL Intravenous PRN Ripudeep K Rai, MD      . technetium albumin aggregated (MAA) injection solution 6 milli Curie  6 milli Curie Intravenous Once PRN Medication Radiologist   5.5 milli Curie at 01/22/11 1315  . DISCONTD: furosemide (LASIX) injection 40 mg  40 mg Intravenous Q12H Ripudeep K Rai, MD      . DISCONTD: polysaccharide iron (NIFEREX) capsule 150 mg  150 mg Oral Daily Ripudeep K Rai, MD   150 mg at 01/22/11 0945    Lab Results:   Basename 01/23/11 0609 01/21/11 1537  WBC 4.6 6.6  HGB 7.9* 8.8*  HCT 24.8* 27.9*  PLT 177 214   BMET  Basename 01/23/11 0609 01/22/11 0533 01/21/11 1537  NA 139 139 140  K 4.2 4.0 4.5  CL 103 103 104  CO2 29 28 26   GLUCOSE 83 135* 130*  BUN 46* 49* 50*  CREATININE 2.78* 2.66* 2.62*  CALCIUM 8.6 8.8 9.0    Studies/Results: NUCLEAR MEDICINE VENTILATION - PERFUSION LUNG SCAN  Technique: Wash-in,  equilibrium, and wash-out phase ventilation  images were obtained using Xe-133 gas. Perfusion images were  obtained in multiple projections after intravenous injection of Tc-  32m MAA.  Radiopharmaceuticals: 5.4 mCi Xe-133 gas and 5.5 mCi Tc-30m MAA.  Comparison: Chest radiograph 01/21/2011  Findings: The perfusion images are essentially normal. There are  no peripheral or large perfusion abnormalities. There is no  evidence for a ventilation-perfusion mismatch. There may be  decreased ventilation at the left lung base compared to the  perfusion.  IMPRESSION:  Very low probability for pulmonary embolism.  Assessment/Plan  Principal Problem:  *CHF, acute on chronic Diastolic Active Problems:  DIABETES MELLITUS, CONTROLLED, WITH COMPLICATIONS  HYPERLIPIDEMIA  ANEMIA OF RENAL FAILURE  ANXIETY  HYPERTENSION  Chronic respiratory failure  CKD (chronic kidney disease) stage 5, GFR less than 15 ml/min  No further episodes of Atrial Tachycardia.  Plan Hgb decreased to 7.9.  Continue to monitor.  Troponin negative times 3.  Low probablility of PE by V/Q scan.  Net out 3 Liters the last two days.  No need for metolazone at this time.  Continue with current lasix dosing and monitor fluids.   LOS: 2 days    HAGER,BRYAN W 01/23/2011 12:57 PM  I have seen and examined (exam above was performed by me) the patient.  I have reviewed the chart, notes and new data.  I agree with Bryan's note.  Key new complaints: none - just "tired" Key examination changes: Still with prominent JVD, poor inspiratory effort - but no clear rales. Key new findings / data: Hgb noted, Cr noted above.  Pleural effusion  PLAN:  Continue with diuresis - (seems to be diuresing with PO lasix, so can continue with PO for now as it is high dose, consider adding Metolazone to Am dose for increased diuresis -- Agree with Dr. Victorino December assessment re: renal function may well be worse than we are thinking once she is more  euvolemic -- which is necessary for resolution of dyspnea & effusion.  Shoot for BNP < 10,000 & Wgt ~190 lb, & allow for permissive increase of Cr  BP not adequately controlled despite multiple high dose meds - also suggests volume overload.  Increase Nitrate as all other meds @ full dose except for clonidine -- which may not be best option for octogenarian with Sinus Loletha Grayer & 1st Degree HB on tele.Marland Kitchen  Epo & Iron for anemia of chronic Dz.  If she continues to diurese well, would need to assess her functional status & dyspnea level with exertion to determine disposition on discharge -- should start sooner than later.  Leonie Man, M.D., M.S. [facility] [address]. [place], [place]  [address]  [phone]  01/23/2011 1:16 PM

## 2011-01-23 NOTE — Progress Notes (Signed)
Patient ID: [name], female   DOB: 16-Mar-1925, 78 y.o.   MRN: [id number] Subjective: Patient seen.Feels better. She complains of occasional cold.  Objective: Vital signs Filed Vitals:   01/22/11 0613 01/22/11 1339 01/22/11 2116 01/23/11 0619  BP: 183/65 164/69 162/73 164/64  Pulse:  60 52 68  Temp:  98.5 F (36.9 C) 98.6 F (37 C) 98.8 F (37.1 C)  TempSrc:  Oral Oral Oral  Resp:  18  18  Height:      Weight:    89.7 kg (197 lb 12 oz)  SpO2:  99% 100% 100%   Weight change: 0.1 kg (3.5 oz) Last BM Date: 01/21/11  Intake/Output from previous day: 01/11 0701 - 01/12 0700 In: 480 [P.O.:480] Out: 2050 [Urine:2050] Total I/O In: 300 [P.O.:300] Out: 1 [Stool:1]   Physical Exam: General: Alert, awake, oriented x3, in no acute distress, pallor  HEENT: No bruits, no goiter. Mucus membranes pink Heart:  .Regular rate and rhythm, without murmurs, rubs, gallops. Lungs: Good air entry with fine crackles bibasilar Abdomen: obese, Soft, nontender, nondistended, positive bowel sounds. Extremities: +1 pedal edema Neuro: non focal. Skin-no ecchymosis MSS-arthritic changes in the knees and the feet.    Lab Results: Basic Metabolic Panel:  Basename 01/23/11 0609 01/22/11 0533  NA 139 139  K 4.2 4.0  CL 103 103  CO2 29 28  GLUCOSE 83 135*  BUN 46* 49*  CREATININE 2.78* 2.66*  CALCIUM 8.6 8.8  MG -- --  PHOS 4.1 --   Liver Function Tests:  [facility] 01/23/11 0609 01/21/11 1537  AST 11 14  ALT 7 8  ALKPHOS 71 85  BILITOT 0.4 0.4  PROT 5.9* 7.0  ALBUMIN 2.6* 3.1*   CBC:  Basename 01/23/11 0609 01/21/11 1537  WBC 4.6 6.6  NEUTROABS 3.1 5.1  HGB 7.9* 8.8*  HCT 24.8* 27.9*  MCV 72.7* 72.8*  PLT 177 214   Cardiac Enzymes:  Basename 01/22/11 1120 01/22/11 0533 01/21/11 2220  CKTOTAL 66 71 79  CKMB 3.0 3.0 3.1  CKMBINDEX -- -- --  TROPONINI <0.30 <0.30 <0.30   BNP:  Basename 01/21/11 1537  PROBNP 15325.0*   D-Dimer:  Basename 01/21/11 2213  DDIMER  2.31*   CBG:  Basename 01/23/11 0840 01/22/11 1811 01/22/11 0808 01/22/11 0612  GLUCAP 93 114* 151* 124*   Urinalysis:  Misc. Labs:  Recent Results (from the past 240 hour(s))  URINE CULTURE     Status: Normal   Collection Time   01/15/11  2:54 PM      Component Value Range Status Comment   Specimen Description URINE, CLEAN CATCH   Final    Special Requests NONE   Final    Setup Time [id number]   Final    Colony Count 10,000 COLONIES/ML   Final    Culture ENTEROCOCCUS SPECIES   Final    Report Status 01/19/2011 FINAL   Final    Organism ID, Bacteria ENTEROCOCCUS SPECIES   Final     Studies/Results: Nm Pulmonary Per & Vent  01/22/2011  *RADIOLOGY REPORT*  Clinical Data:  Shortness of breath and elevated D-dimer.  NUCLEAR MEDICINE VENTILATION - PERFUSION LUNG SCAN  Technique:  Wash-in, equilibrium, and wash-out phase ventilation images were obtained using Xe-133 gas.  Perfusion images were obtained in multiple projections after intravenous injection of Tc- 63m MAA.  Radiopharmaceuticals:  5.4 mCi Xe-133 gas and 5.5 mCi Tc-72m MAA.  Comparison:  Chest radiograph 01/21/2011  Findings: The perfusion images are essentially normal.  There are no peripheral or large perfusion abnormalities.  There is no evidence for a ventilation-perfusion mismatch.  There may be decreased ventilation at the left lung base compared to the perfusion.  IMPRESSION: Very low probability for pulmonary embolism.  Original Report Authenticated By: Markus Daft, M.D.   Dg Chest Port 1 View  01/21/2011  *RADIOLOGY REPORT*  Clinical Data: Shortness of breath.  PORTABLE CHEST - 1 VIEW  Comparison: Chest x-ray 01/19/2011.  Findings: The heart is enlarged but stable.  There is vascular congestion and mild interstitial pulmonary edema.  Small pleural effusions are noted along with left greater than right basilar atelectasis.  IMPRESSION: CHF.  Original Report Authenticated By: P. Kalman Jewels, M.D.     Medications: Scheduled Meds:    . amLODipine  10 mg Oral Daily  . calcitRIOL  0.25 mcg Oral Daily  . carvedilol  25 mg Oral BID WC  . citalopram  20 mg Oral Daily  . cloNIDine  0.2 mg Oral BID  . darbepoetin (ARANESP) injection - DIALYSIS  200 mcg Subcutaneous Weekly  . ferumoxytol  510 mg Intravenous Once  . fluticasone  2 spray Each Nare Daily  . furosemide  160 mg Oral BID  . hydrALAZINE  100 mg Oral Q8H  . insulin aspart protamine-insulin aspart  5 Units Subcutaneous Q supper  . insulin aspart protamine-insulin aspart  8 Units Subcutaneous Q breakfast  . isosorbide mononitrate  30 mg Oral Daily  . potassium chloride  10 mEq Oral Daily  . simvastatin  10 mg Oral q1800  . sodium chloride  3 mL Intravenous Q12H  . DISCONTD: furosemide  40 mg Intravenous Q12H  . DISCONTD: polysaccharide iron  150 mg Oral Daily   Continuous Infusions:  PRN Meds:.sodium chloride, acetaminophen, hydrALAZINE, ondansetron (ZOFRAN) IV, sodium chloride, technetium albumin aggregated  Assessment/Plan: #1 congestive heart failure questionable diastolic dysfunction would continue Lasix as well as carvedilol. Cardiology is also following patient. 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 #3 anemia-most likely of chronic disorder. Although patient is on iron supplement and aranesp, hemoglobin today is 7.9 and therefore patient will receive one unit of packed RBC transfusion. #4 diabetes mellitus-continue insulin sliding scale #5 hypertension we'll continue to maintain blood pressure with clonidine as well as hydralazine #6 hyperlipidemia-continue Zocor #7 history of anxiety disorder-clinically stable #8 recurrent cold-Will rule out thyroid disorder-order TSH/T3/T4.   Kazmir Oki M.D. [facility] 01/23/2011, 12:56 PM

## 2011-01-24 LAB — RENAL FUNCTION PANEL
CO2: 28 mEq/L (ref 19–32)
Calcium: 8.6 mg/dL (ref 8.4–10.5)
Creatinine, Ser: 2.76 mg/dL — ABNORMAL HIGH (ref 0.50–1.10)
GFR calc non Af Amer: 15 mL/min — ABNORMAL LOW (ref >90)
Potassium: 3.9 mEq/L (ref 3.5–5.1)
Sodium: 137 mEq/L (ref 135–145)

## 2011-01-24 LAB — DIFFERENTIAL
Basophils Absolute: 0 10*3/uL (ref 0.0–0.1)
Eosinophils Relative: 1 % (ref 0–5)
Lymphocytes Relative: 18 % (ref 12–46)
Monocytes Absolute: 0.8 10*3/uL (ref 0.1–1.0)

## 2011-01-24 LAB — CBC
HCT: 27.5 % — ABNORMAL LOW (ref 36.0–46.0)
Hemoglobin: 8.9 g/dL — ABNORMAL LOW (ref 12.0–15.0)
RBC: 3.72 MIL/uL — ABNORMAL LOW (ref 3.87–5.11)
WBC: 6 10*3/uL (ref 4.0–10.5)

## 2011-01-24 LAB — COMPREHENSIVE METABOLIC PANEL
AST: 11 U/L (ref 0–37)
CO2: 27 mEq/L (ref 19–32)
Calcium: 8.7 mg/dL (ref 8.4–10.5)
Creatinine, Ser: 2.81 mg/dL — ABNORMAL HIGH (ref 0.50–1.10)
GFR calc Af Amer: 17 mL/min — ABNORMAL LOW (ref >90)
GFR calc non Af Amer: 15 mL/min — ABNORMAL LOW (ref >90)
Glucose, Bld: 78 mg/dL (ref 70–99)

## 2011-01-24 LAB — GLUCOSE, CAPILLARY: Glucose-Capillary: 88 mg/dL (ref 70–99)

## 2011-01-24 MED ORDER — CLONIDINE HCL 0.2 MG PO TABS
0.2000 mg | ORAL_TABLET | Freq: Three times a day (TID) | ORAL | Status: DC
  Administered 2011-01-24 – 2011-01-26 (×7): 0.2 mg via ORAL
  Filled 2011-01-24 (×8): qty 1

## 2011-01-24 MED ORDER — FUROSEMIDE 80 MG PO TABS
160.0000 mg | ORAL_TABLET | Freq: Three times a day (TID) | ORAL | Status: DC
  Administered 2011-01-24 – 2011-01-25 (×3): 160 mg via ORAL
  Filled 2011-01-24 (×5): qty 2

## 2011-01-24 NOTE — Progress Notes (Signed)
Patient ID: [name], female   DOB: [birth year], 78 y.o.   MRN: [id number] Patient ID: [name], female   DOB: May 08, 1925, 77 y.o.   MRN: [id number] Subjective: Patient seen.Feels better.Transfused with one unit of packed RBCs yesterday.  Objective: Vital signs Filed Vitals:   01/23/11 1835 01/23/11 1900 01/23/11 2237 01/24/11 0539  BP: 158/68 156/68 140/80 168/68  Pulse: 56 55 58 58  Temp: 99 F (37.2 C) 99 F (37.2 C) 99.6 F (37.6 C) 99.4 F (37.4 C)  TempSrc: Oral Oral Oral Oral  Resp: 18 18 18 16   Height:      Weight:    87.8 kg (193 lb 9 oz)  SpO2:   98% 99%   Weight change: -1.9 kg (-4 lb 3 oz) Last BM Date: 01/23/11  Intake/Output from previous day: 01/12 0701 - 01/13 0700 In: 1050 [P.O.:470; I.V.:250; Blood:330] Out: 2201 [Urine:2200; Stool:1] Total I/O In: 120 [P.O.:120] Out: 800 [Urine:800]   Physical Exam: General: Alert, awake, oriented x3, in no acute distress, pallor  HEENT: No bruits, no goiter. Mucus membranes pink Heart:  .Regular rate and rhythm, without murmurs, rubs, gallops. Lungs: Good air entry with fine crackles bibasilar Abdomen: obese, Soft, nontender, nondistended, positive bowel sounds. Extremities: +1 pedal edema Neuro: non focal. Skin-no ecchymosis MSS-arthritic changes in the knees and the feet.    Lab Results: Basic Metabolic Panel:  Basename 01/24/11 0534 01/23/11 0609  NA 137137 139  K 3.94.0 4.2  CL 99100 103  CO2 2827 29  GLUCOSE 7778 83  BUN 48*48* 46*  CREATININE 2.76*2.81* 2.78*  CALCIUM 8.68.7 8.6  MG -- --  PHOS 4.0 4.1   Liver Function Tests:  Basename 01/24/11 0534 01/23/11 0609  AST 11 11  ALT 5 7  ALKPHOS 71 71  BILITOT 0.5 0.4  PROT 6.0 5.9*  ALBUMIN 2.6*2.8* 2.6*   CBC:  Basename 01/24/11 0534 01/23/11 0609  WBC 6.0 4.6  NEUTROABS 4.0 3.1  HGB 8.9* 7.9*  HCT 27.5* 24.8*  MCV 73.9* 72.7*  PLT 171 177   Cardiac Enzymes:  Basename 01/22/11 1120 01/22/11 0533 01/21/11 2220   CKTOTAL 66 71 79  CKMB 3.0 3.0 3.1  CKMBINDEX -- -- --  TROPONINI <0.30 <0.30 <0.30   BNP:  Basename 01/21/11 1537  PROBNP 15325.0*   D-Dimer:  Basename 01/21/11 2213  DDIMER 2.31*   CBG:  Basename 01/24/11 0731 01/23/11 1753 01/23/11 0840 01/22/11 1811 01/22/11 0808 01/22/11 0612  GLUCAP 88 89 93 114* 151* 124*   Urinalysis:  Misc. Labs:  Recent Results (from the past 240 hour(s))  URINE CULTURE     Status: Normal   Collection Time   01/15/11  2:54 PM      Component Value Range Status Comment   Specimen Description URINE, CLEAN CATCH   Final    Special Requests NONE   Final    Setup Time [id number]   Final    Colony Count 10,000 COLONIES/ML   Final    Culture ENTEROCOCCUS SPECIES   Final    Report Status 01/19/2011 FINAL   Final    Organism ID, Bacteria ENTEROCOCCUS SPECIES   Final   URINE CULTURE     Status: Normal (Preliminary result)   Collection Time   01/23/11 10:43 AM      Component Value Range Status Comment   Specimen Description URINE, CATHETERIZED   Final    Special Requests NONE   Final    Setup Time [id number]   Final  Colony Count >=100,000 COLONIES/ML   Final    Culture PSEUDOMONAS AERUGINOSA   Final    Report Status PENDING   Incomplete     Studies/Results: Nm Pulmonary Per & Vent  01/22/2011  *RADIOLOGY REPORT*  Clinical Data:  Shortness of breath and elevated D-dimer.  NUCLEAR MEDICINE VENTILATION - PERFUSION LUNG SCAN  Technique:  Wash-in, equilibrium, and wash-out phase ventilation images were obtained using Xe-133 gas.  Perfusion images were obtained in multiple projections after intravenous injection of Tc- 62m MAA.  Radiopharmaceuticals:  5.4 mCi Xe-133 gas and 5.5 mCi Tc-55m MAA.  Comparison:  Chest radiograph 01/21/2011  Findings: The perfusion images are essentially normal.  There are no peripheral or large perfusion abnormalities.  There is no evidence for a ventilation-perfusion mismatch.  There may be decreased ventilation at the  left lung base compared to the perfusion.  IMPRESSION: Very low probability for pulmonary embolism.  Original Report Authenticated By: Markus Daft, M.D.    Medications: Scheduled Meds:    . amLODipine  10 mg Oral Daily  . calcitRIOL  0.25 mcg Oral Daily  . carvedilol  25 mg Oral BID WC  . citalopram  20 mg Oral Daily  . cloNIDine  0.2 mg Oral BID  . darbepoetin (ARANESP) injection - DIALYSIS  200 mcg Subcutaneous Weekly  . fluticasone  2 spray Each Nare Daily  . furosemide  160 mg Oral TID  . hydrALAZINE  100 mg Oral Q8H  . insulin aspart protamine-insulin aspart  5 Units Subcutaneous Q supper  . insulin aspart protamine-insulin aspart  8 Units Subcutaneous Q breakfast  . isosorbide mononitrate  30 mg Oral Daily  . potassium chloride  10 mEq Oral Daily  . simvastatin  10 mg Oral q1800  . sodium chloride  3 mL Intravenous Q12H  . DISCONTD: furosemide  160 mg Oral BID   Continuous Infusions:  PRN Meds:.sodium chloride, acetaminophen, hydrALAZINE, ondansetron (ZOFRAN) IV, sodium chloride  Assessment/Plan: #1 congestive heart failure questionable diastolic dysfunction would continue Lasix as well as carvedilol. Cardiology is also following patient. 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 #3 anemia-most likely of chronic disoder status post packed RBC transfusion. Patient also to continue on iron and aranesp #4 diabetes mellitus-continue insulin sliding scale #5 hypertension-blood pressure still uncontrolled. Will adjust meds. #6 hyperlipidemia-continue Zocor #7 history of anxiety disorder-clinically stable #8 recurrent cold-Await result of TSH/T3/T4. Course of physical therapy for gradual ambulation.   Margree Gimbel M.D. [facility] 01/24/2011, 12:10 PM

## 2011-01-24 NOTE — Progress Notes (Signed)
Subjective: Interval History: none.  Objective: Vital signs in last 24 hours: Temp:  [98.3 F (36.8 C)-99.6 F (37.6 C)] 99.4 F (37.4 C) (01/13 0539) Pulse Rate:  [52-58] 58  (01/13 0539) Resp:  [16-20] 16  (01/13 0539) BP: (135-168)/(47-80) 168/68 mmHg (01/13 0539) SpO2:  [98 %-100 %] 99 % (01/13 0539) Weight:  [87.8 kg (193 lb 9 oz)] 87.8 kg (193 lb 9 oz) (01/13 0539) Weight change: -1.9 kg (-4 lb 3 oz)  Intake/Output from previous day: 01/12 0701 - 01/13 0700 In: 1050 [P.O.:470; I.V.:250; Blood:330] Out: 2201 [Urine:2200; Stool:1] Intake/Output this shift:    General appearance: alert, cooperative and appears stated age Resp: diminished breath sounds bilaterally and rales bibasilar Cardio: S1, S2 normal and systolic murmur: holosystolic 2/6, blowing at apex GI: liver down 4 cm, pos bs, soft Extremities: edema 2 plus, AVF LUA  Lab Results:  [facility] 01/24/11 0534 01/23/11 0609  WBC 6.0 4.6  HGB 8.9* 7.9*  HCT 27.5* 24.8*  PLT 171 177   BMET:  Basename 01/24/11 0534 01/23/11 0609  NA 137137 139  K 3.94.0 4.2  CL 99100 103  CO2 2827 29  GLUCOSE 7778 83  BUN 48*48* 46*  CREATININE 2.76*2.81* 2.78*  CALCIUM 8.68.7 8.6   No results found for this basename: PTH:2 in the last 72 hours Iron Studies:  Basename 01/22/11 1511  IRON 40*  TIBC 169*  TRANSFERRIN --  FERRITIN --    Studies/Results: Nm Pulmonary Per & Vent  01/22/2011  *RADIOLOGY REPORT*  Clinical Data:  Shortness of breath and elevated D-dimer.  NUCLEAR MEDICINE VENTILATION - PERFUSION LUNG SCAN  Technique:  Wash-in, equilibrium, and wash-out phase ventilation images were obtained using Xe-133 gas.  Perfusion images were obtained in multiple projections after intravenous injection of Tc- 49m MAA.  Radiopharmaceuticals:  5.4 mCi Xe-133 gas and 5.5 mCi Tc-27m MAA.  Comparison:  Chest radiograph 01/21/2011  Findings: The perfusion images are essentially normal.  There are no peripheral or large  perfusion abnormalities.  There is no evidence for a ventilation-perfusion mismatch.  There may be decreased ventilation at the left lung base compared to the perfusion.  IMPRESSION: Very low probability for pulmonary embolism.  Original Report Authenticated By: Markus Daft, M.D.    I have reviewed the patient's current medications.  Assessment/Plan: 1 CKD 4 - 5, diuresing, will ^ dose while here and would D/C on bid 160 mg.  Close to HD, but not indic now.   2 CHF secondary to #1 3 Anemia Biven Fe, EPO 4 HPT P 5 Dm fair control 6 HTN better with diuresis  P ^ Lasix. Close F/u .  Can D/C home within 24 hours but needs close F/U    LOS: 3 days   [name] 01/24/2011,8:22 AM

## 2011-01-25 ENCOUNTER — Inpatient Hospital Stay (HOSPITAL_COMMUNITY): Payer: Medicare HMO

## 2011-01-25 LAB — GLUCOSE, CAPILLARY: Glucose-Capillary: 92 mg/dL (ref 70–99)

## 2011-01-25 LAB — COMPREHENSIVE METABOLIC PANEL
AST: 10 U/L (ref 0–37)
Albumin: 2.8 g/dL — ABNORMAL LOW (ref 3.5–5.2)
Alkaline Phosphatase: 74 U/L (ref 39–117)
Chloride: 99 mEq/L (ref 96–112)
Potassium: 3.6 mEq/L (ref 3.5–5.1)
Sodium: 137 mEq/L (ref 135–145)
Total Bilirubin: 0.3 mg/dL (ref 0.3–1.2)

## 2011-01-25 LAB — PARATHYROID HORMONE, INTACT (NO CA): PTH: 346.4 pg/mL — ABNORMAL HIGH (ref 14.0–72.0)

## 2011-01-25 LAB — DIFFERENTIAL
Eosinophils Absolute: 0 10*3/uL (ref 0.0–0.7)
Eosinophils Relative: 1 % (ref 0–5)
Lymphocytes Relative: 22 % (ref 12–46)
Lymphs Abs: 1.4 10*3/uL (ref 0.7–4.0)
Monocytes Relative: 15 % — ABNORMAL HIGH (ref 3–12)

## 2011-01-25 LAB — CBC
Platelets: 164 10*3/uL (ref 150–400)
RDW: 16.7 % — ABNORMAL HIGH (ref 11.5–15.5)
WBC: 6.2 10*3/uL (ref 4.0–10.5)

## 2011-01-25 MED ORDER — ISOSORBIDE MONONITRATE ER 60 MG PO TB24: 60.0000 mg | ORAL_TABLET | Freq: Every day | ORAL | Status: DC

## 2011-01-25 MED ORDER — LEVOTHYROXINE SODIUM 25 MCG PO TABS: 25.0000 ug | ORAL_TABLET | Freq: Every day | ORAL | Status: DC

## 2011-01-25 MED ORDER — FUROSEMIDE 80 MG PO TABS: 160.0000 mg | ORAL_TABLET | Freq: Two times a day (BID) | ORAL | Status: DC

## 2011-01-25 MED ORDER — CIPROFLOXACIN HCL 250 MG PO TABS: 250.0000 mg | ORAL_TABLET | Freq: Every day | ORAL | Status: AC

## 2011-01-25 MED ORDER — FUROSEMIDE 80 MG PO TABS
160.0000 mg | ORAL_TABLET | Freq: Two times a day (BID) | ORAL | Status: DC
  Administered 2011-01-25 – 2011-01-26 (×2): 160 mg via ORAL
  Filled 2011-01-25 (×3): qty 2

## 2011-01-25 MED ORDER — CIPROFLOXACIN HCL 250 MG PO TABS
250.0000 mg | ORAL_TABLET | Freq: Every day | ORAL | Status: DC
  Administered 2011-01-25 – 2011-01-26 (×2): 250 mg via ORAL
  Filled 2011-01-25 (×2): qty 1

## 2011-01-25 MED ORDER — ALBUTEROL SULFATE HFA 108 (90 BASE) MCG/ACT IN AERS: 2.0000 | INHALATION_SPRAY | RESPIRATORY_TRACT | Status: DC | PRN

## 2011-01-25 MED ORDER — CLONIDINE HCL 0.2 MG PO TABS: 0.2000 mg | ORAL_TABLET | Freq: Three times a day (TID) | ORAL | Status: DC

## 2011-01-25 MED ORDER — LEVOTHYROXINE SODIUM 25 MCG PO TABS
25.0000 ug | ORAL_TABLET | Freq: Every day | ORAL | Status: DC
  Administered 2011-01-26: 25 ug via ORAL
  Filled 2011-01-25: qty 1

## 2011-01-25 MED ORDER — ISOSORBIDE MONONITRATE ER 60 MG PO TB24
60.0000 mg | ORAL_TABLET | Freq: Every day | ORAL | Status: DC
  Administered 2011-01-25 – 2011-01-26 (×2): 60 mg via ORAL
  Filled 2011-01-25 (×2): qty 1

## 2011-01-25 NOTE — Progress Notes (Signed)
Subjective: Interval History: none. and has no complaint of feels a lot better.  Objective: Vital signs in last 24 hours: Temp:  [98.7 F (37.1 C)-99 F (37.2 C)] 98.8 F (37.1 C) (01/14 0540) Pulse Rate:  [52-71] 54  (01/14 0540) Resp:  [18-20] 20  (01/14 0540) BP: (129-171)/(53-66) 165/66 mmHg (01/14 0540) SpO2:  [85 %-100 %] 99 % (01/14 0540) Weight change:   Intake/Output from previous day: 01/13 0701 - 01/14 0700 In: 120 [P.O.:120] Out: 2050 [Urine:2050] Intake/Output this shift: Total I/O In: 220 [P.O.:220] Out: 201 [Urine:200; Stool:1]  General appearance: alert, cooperative and moderately obese Resp: rales bibasilar Cardio: S1, S2 normal and systolic murmur: systolic ejection 2/6, decrescendo at 2nd left intercostal space GI: obese, pos bs, soft, liver down 6 cm Extremities: edema 2 plus  Lab Results:  [facility] 01/25/11 0519 01/24/11 0534  WBC 6.2 6.0  HGB 9.0* 8.9*  HCT 28.2* 27.5*  PLT 164 171   BMET:  Basename 01/25/11 0519 01/24/11 0534  NA 137 137137  K 3.6 3.94.0  CL 99 99100  CO2 29 2827  GLUCOSE 92 7778  BUN 50* 48*48*  CREATININE 2.96* 2.76*2.81*  CALCIUM 8.9 8.68.7   No results found for this basename: PTH:2 in the last 72 hours Iron Studies:  Basename 01/22/11 1511  IRON 40*  TIBC 169*  TRANSFERRIN --  FERRITIN --    Studies/Results: No results found.  I have reviewed the patient's current medications.  Assessment/Plan: 1 CKD stable diuresing, will need F/U soone 2 Anemia given EPO & fe 3 DM stabel 4 HPTH stable 5 Vol xs improved  p lower Lasix, will see again at your requies    LOS: 4 days   Madline Oesterling L 01/25/2011,12:29 PM

## 2011-01-25 NOTE — Progress Notes (Signed)
Subjective: No chest pain. No SOB.  Objective: Vital signs in last 24 hours: Temp:  [98.7 F (37.1 C)-99 F (37.2 C)] 98.8 F (37.1 C) (01/14 0540) Pulse Rate:  [52-71] 54  (01/14 0540) Resp:  [18-20] 20  (01/14 0540) BP: (129-171)/(53-66) 165/66 mmHg (01/14 0540) SpO2:  [85 %-100 %] 99 % (01/14 0540) Weight change:  Last BM Date: 01/24/11 Intake/Output from previous day:  -1730 01/13 0701 - 01/14 0700 In: 120 [P.O.:120] Out: 2050 [Urine:2050] Intake/Output this shift: Total I/O In: 220 [P.O.:220] Out: 200 [Urine:200]  PE: General: A&O X 3, Pleasant affect, brighter than last week. Heart:S1S2 RRR. Lungs:diminished in the bases. No wheezes, no rales. Abd:+BS, soft, non-tender Ext:no edema   Lab Results:  Basename 01/25/11 0519 01/24/11 0534  WBC 6.2 6.0  HGB 9.0* 8.9*  HCT 28.2* 27.5*  PLT 164 171   BMET  Basename 01/25/11 0519 01/24/11 0534  NA 137 137137  K 3.6 3.94.0  CL 99 99100  CO2 29 2827  GLUCOSE 92 7778  BUN 50* 48*48*  CREATININE 2.96* 2.76*2.81*  CALCIUM 8.9 8.68.7    Basename 01/22/11 1120  TROPONINI <0.30    Lab Results  Component Value Date   CHOL 130 12/09/2010   HDL 45 12/09/2010   LDLCALC 72 12/09/2010   TRIG 66 12/09/2010   CHOLHDL 2.9 12/09/2010   Lab Results  Component Value Date   HGBA1C 7.4* 01/05/2011     Lab Results  Component Value Date   TSH 1.810 01/23/2011    Hepatic Function Panel  Basename 01/25/11 0519  PROT 6.3  ALBUMIN 2.8*  AST 10  ALT 5  ALKPHOS 74  BILITOT 0.3  BILIDIR --  IBILI --   No results found for this basename: CHOL in the last 72 hours No results found for this basename: PROTIME in the last 72 hours    EKG: Orders placed in visit on 01/21/11  . EKG 12-LEAD    Studies/Results: No results found.  Medications: I have reviewed the patient's current medications.    Marland Kitchen amLODipine  10 mg Oral Daily  . calcitRIOL  0.25 mcg Oral Daily  . carvedilol  25 mg Oral BID WC  .  citalopram  20 mg Oral Daily  . cloNIDine  0.2 mg Oral TID  . darbepoetin (ARANESP) injection - DIALYSIS  200 mcg Subcutaneous Weekly  . fluticasone  2 spray Each Nare Daily  . furosemide  160 mg Oral TID  . hydrALAZINE  100 mg Oral Q8H  . insulin aspart protamine-insulin aspart  5 Units Subcutaneous Q supper  . insulin aspart protamine-insulin aspart  8 Units Subcutaneous Q breakfast  . isosorbide mononitrate  30 mg Oral Daily  . potassium chloride  10 mEq Oral Daily  . simvastatin  10 mg Oral q1800  . sodium chloride  3 mL Intravenous Q12H  . DISCONTD: cloNIDine  0.2 mg Oral BID   Assessment/Plan: Patient Active Problem List  Diagnoses  . DIABETES MELLITUS, CONTROLLED, WITH COMPLICATIONS  . HYPERLIPIDEMIA  . ANEMIA OF RENAL FAILURE  . ANXIETY  . CATARACT NOS  . HYPERTENSION  . CONGESTIVE HEART FAILURE UNSPECIFIED  . ALLERGIC RHINITIS  . IBS  . Chronic kidney disease, stage 4, severely decreased GFR  . ARTHRITIS  . DEPENDENT EDEMA, LEGS, BILATERAL  . DYSPNEA ON EXERTION  . CEREBROVASCULAR ACCIDENT, HX OF  . Arm bruise  . Colon polyps  . Insomnia  . Fall  . Constipation  . Altered level of consciousness  .  Chronic respiratory failure  . Nausea & vomiting  . SOB (shortness of breath)  . CKD (chronic kidney disease) stage 5, GFR less than 15 ml/min  . CHF, acute on chronic   PLAN: Pt. Transfused 1 unit PRBC on 01/23/11.  Continues on iron and aranesp.  BP continues to be elevated.  Will increase Imdur to 60 mg.   Weight has decreased by 1.8 kg since admit.  Continues on Lasix 160 mg  TID and neg > 1 L, daily. Renal following.  Overall improved, denies SB, but had not yet walked with P.T.  LOS: 4 days   INGOLD,LAURA R 01/25/2011, 10:02 AM Agree with note written by Cecilie Kicks RNP  Clinically improving. On high dose diuretics. Nl LV systolic function with diastolic dysfunction. SCr increased to 2.96. Lungs clear. No increased JVP or peripheral edema.  She has an AVF  in place but has yet to start HD.On multiple antihypertensive meds. Neg i/os. Close to D/C home. May want to send home on lasix and metolozone as well. I suspect that she is close to needing chronic HD for BP and volume control. F/U  with [place] as OP in our [place] office.   Lorretta Harp 01/25/2011 4:38 PM

## 2011-01-25 NOTE — Progress Notes (Signed)
Physical Therapy Treatment Patient Details Name: [name] MRN: [id number] DOB: 18-May-1925 Today's Date: 01/25/2011 Time: [id number]  1G PT Assessment/Plan  PT - Assessment/Plan Comments on Treatment Session: Pt performed well with ambulation. Ambulated on O2. Continue to recommend HHPT and home health aide PT Plan: Discharge plan remains appropriate Recommendations for Other Services: OT consult Follow Up Recommendations: Home health PT and home health aide Equipment Recommended: None recommended by PT PT Goals  Acute Rehab PT Goals PT Goal: Sit to Supine/Side - Progress: Met PT Goal: Sit to Stand - Progress: Progressing toward goal PT Goal: Stand to Sit - Progress: Met PT Goal: Ambulate - Progress: Progressing toward goal  PT Treatment Precautions/Restrictions  Precautions Precautions: Fall Restrictions Weight Bearing Restrictions: No Mobility (including Balance) Bed Mobility Bed Mobility: Yes Sit to Supine: 6: Modified independent (Device/Increase time) Transfers Transfers: Yes Sit to Stand: 4: Sit to Stand Details (indicate cue type and reason): Min-guard assist. VCs hand placement.  Stand to Sit: 5: Supervision Stand to Sit Details: Vcs safety.  Ambulation/Gait Ambulation/Gait: Yes Ambulation/Gait Assistance Details (indicate cue type and reason): Min-guard assist. VCs posture.  Ambulation Distance (Feet): 140 Feet Assistive device: Rolling walker Gait Pattern: Step-through pattern;Trunk flexed    Exercise    End of Session PT - End of Session Equipment Utilized During Treatment: Gait belt Activity Tolerance: Patient tolerated treatment well Patient left: in bed;with call bell in reach General Behavior During Session: [facility] for tasks performed Cognition: [facility] for tasks performed  [facility] 01/25/2011, 3:15 PM [phone]

## 2011-01-25 NOTE — Progress Notes (Signed)
Subjective: "I'm feeling much better and am ready to go home."  Objective: Vital signs Filed Vitals:   01/24/11 2131 01/24/11 2300 01/25/11 0540 01/25/11 1320  BP:  160/63 165/66 120/61  Pulse: 71  54 51  Temp:   98.8 F (37.1 C) 98.6 F (37 C)  TempSrc:   Oral Oral  Resp:   20 20  Height:      Weight:      SpO2:   99% 99%   Weight change:  Last BM Date: 01/24/11  Intake/Output from previous day: 01/13 0701 - 01/14 0700 In: 120 [P.O.:120] Out: 2050 [Urine:2050] Total I/O In: 440 [P.O.:440] Out: 401 [Urine:400; Stool:1]   Physical Exam: General: Alert, awake, oriented x3, in no acute distress.Up in chair, smiling HEENT: No bruits, no goiter. Heart: Regular rate and rhythm,  rubs, gallops. Trace to 1+ LEE Lungs: Normal effort. Breath sounds with fine crackles bilateral bases otherwise clear to auscultation bilaterally. Abdomen:obese, Soft, nontender, nondistended, positive bowel sounds. Extremities: No clubbing cyanosis with positive pedal pulses. Neuro: Grossly intact, nonfocal. Speech clear.    Lab Results: Basic Metabolic Panel:  Basename 01/25/11 0519 01/24/11 0534  NA 137 137137  K 3.6 3.94.0  CL 99 99100  CO2 29 2827  GLUCOSE 92 7778  BUN 50* 48*48*  CREATININE 2.96* 2.76*2.81*  CALCIUM 8.9 8.68.7  MG -- --  PHOS 3.7 4.0   Liver Function Tests:  Basename 01/25/11 0519 01/24/11 0534  AST 10 11  ALT 5 5  ALKPHOS 74 71  BILITOT 0.3 0.5  PROT 6.3 6.0  ALBUMIN 2.8* 2.6*2.8*   No results found for this basename: LIPASE:2,AMYLASE:2 in the last 72 hours No results found for this basename: AMMONIA:2 in the last 72 hours CBC:  Basename 01/25/11 0519 01/24/11 0534  WBC 6.2 6.0  NEUTROABS 3.8 4.0  HGB 9.0* 8.9*  HCT 28.2* 27.5*  MCV 73.2* 73.9*  PLT 164 171   Cardiac Enzymes: No results found for this basename: CKTOTAL:3,CKMB:3,CKMBINDEX:3,TROPONINI:3 in the last 72 hours BNP: No results found for this basename: PROBNP:3 in the last 72  hours D-Dimer: No results found for this basename: DDIMER:2 in the last 72 hours CBG:  Basename 01/25/11 0814 01/24/11 0731 01/23/11 1753 01/23/11 0840 01/22/11 1811  GLUCAP 103* 88 89 93 114*   Hemoglobin A1C: No results found for this basename: HGBA1C in the last 72 hours Fasting Lipid Panel: No results found for this basename: CHOL,HDL,LDLCALC,TRIG,CHOLHDL,LDLDIRECT in the last 72 hours Thyroid Function Tests:  Basename 01/23/11 0500  TSH 1.810  T4TOTAL --  FREET4 1.26  T3FREE 2.2*  THYROIDAB --   Anemia Panel:  Basename 01/22/11 1511  VITAMINB12 --  FOLATE --  FERRITIN --  TIBC 169*  IRON 40*  RETICCTPCT --   Coagulation: No results found for this basename: LABPROT:2,INR:2 in the last 72 hours Urine Drug Screen: Drugs of Abuse  No results found for this basename: labopia, cocainscrnur, labbenz, amphetmu, thcu, labbarb    Alcohol Level: No results found for this basename: ETH:2 in the last 72 hours Urinalysis:  Misc. Labs:  Recent Results (from the past 240 hour(s))  URINE CULTURE     Status: Normal   Collection Time   01/15/11  2:54 PM      Component Value Range Status Comment   Specimen Description URINE, CLEAN CATCH   Final    Special Requests NONE   Final    Setup Time [id number]   Final    Colony Count 10,000 COLONIES/ML  Final    Culture ENTEROCOCCUS SPECIES   Final    Report Status 01/19/2011 FINAL   Final    Organism ID, Bacteria ENTEROCOCCUS SPECIES   Final   URINE CULTURE     Status: Normal   Collection Time   01/23/11 10:43 AM      Component Value Range Status Comment   Specimen Description URINE, CATHETERIZED   Final    Special Requests NONE   Final    Setup Time [id number]   Final    Colony Count >=100,000 COLONIES/ML   Final    Culture PSEUDOMONAS AERUGINOSA   Final    Report Status 01/25/2011 FINAL   Final    Organism ID, Bacteria PSEUDOMONAS AERUGINOSA   Final     Studies/Results: No results found.  Medications: Scheduled  Meds:   . amLODipine  10 mg Oral Daily  . calcitRIOL  0.25 mcg Oral Daily  . carvedilol  25 mg Oral BID WC  . citalopram  20 mg Oral Daily  . cloNIDine  0.2 mg Oral TID  . darbepoetin (ARANESP) injection - DIALYSIS  200 mcg Subcutaneous Weekly  . fluticasone  2 spray Each Nare Daily  . furosemide  160 mg Oral BID  . hydrALAZINE  100 mg Oral Q8H  . insulin aspart protamine-insulin aspart  5 Units Subcutaneous Q supper  . insulin aspart protamine-insulin aspart  8 Units Subcutaneous Q breakfast  . isosorbide mononitrate  60 mg Oral Daily  . levothyroxine  25 mcg Oral QAC breakfast  . potassium chloride  10 mEq Oral Daily  . simvastatin  10 mg Oral q1800  . sodium chloride  3 mL Intravenous Q12H  . DISCONTD: furosemide  160 mg Oral TID  . DISCONTD: isosorbide mononitrate  30 mg Oral Daily   Continuous Infusions:  PRN Meds:.sodium chloride, acetaminophen, hydrALAZINE, ondansetron (ZOFRAN) IV, sodium chloride  Assessment/Plan:  Principal Problem: 1 congestive heart failure questionable diastolic dysfunction. Improved.  would continue Lasix as well as carvedilol. Cardiology increasing Imdur today to 60mg . Appreciate assistance.   #2 chronic kidney disease stage 4/5- stable. patient is on calcitriol also been followed by nephrology. Appreciate assistance. Renal lowered lasix dose and Ok's for discharge in am.   #3 anemia-most likely of chronic disoder status post packed RBC transfusion. Hg 9.0 from 7.9 on 1/12, Patient also to continue on iron and aranesp . No s/sx blding. #4 diabetes mellitus- controlled. continue insulin sliding scale  #5 hypertension-blood pressure still uncontrolled. Will adjust meds.  #6 hyperlipidemia-continue Zocor  #7 history of anxiety disorder-clinically stable  #8 recurrent cold- TSH 1.8, Free T3 2.2 Free T4 1.2. Will start synthroid 54mcg po. Will need OP F/U in 3 weeks.  #9. Dispo: likely discharged to home in am. Will arrange for [facility] to be resumed.    Patient seen and evaluated by me. Fully examined. Agree with assessment and plan Jackie Plum  [id number]       LOS: 4 days   [facility] M 01/25/2011, 2:03 PM

## 2011-01-25 NOTE — Progress Notes (Signed)
PT/OT/ST Cancellation Note  ___Treatment cancelled today due to medical issues with patient which prohibited therapy  ___ Treatment cancelled today due to patient receiving procedure or test   __x_ Treatment cancelled today due to patient's refusal to participate this a.m. Pt c/o being "cold". Pt stated she will participate once she warms up. Will check back as schedule permits. Thanks.  ___ Treatment cancelled today due to   Signature: Alphonzo Severance, PT [phone]

## 2011-01-26 LAB — GLUCOSE, CAPILLARY: Glucose-Capillary: 129 mg/dL — ABNORMAL HIGH (ref 70–99)

## 2011-01-26 LAB — RENAL FUNCTION PANEL
BUN: 51 mg/dL — ABNORMAL HIGH (ref 6–23)
Chloride: 98 mEq/L (ref 96–112)
Creatinine, Ser: 3.16 mg/dL — ABNORMAL HIGH (ref 0.50–1.10)
Glucose, Bld: 121 mg/dL — ABNORMAL HIGH (ref 70–99)
Potassium: 3.8 mEq/L (ref 3.5–5.1)

## 2011-01-26 MED ORDER — METOLAZONE 2.5 MG PO TABS: 2.5000 mg | ORAL_TABLET | Freq: Every day | ORAL | Status: DC

## 2011-01-26 MED ORDER — METOLAZONE 2.5 MG PO TABS
2.5000 mg | ORAL_TABLET | Freq: Every day | ORAL | Status: DC
  Administered 2011-01-26: 2.5 mg via ORAL
  Filled 2011-01-26: qty 1

## 2011-01-26 NOTE — Progress Notes (Signed)
Patient being discharged home.  Handouts on all new prescriptions and diet given to patient and family at discharge. [name], [name]

## 2011-01-26 NOTE — Discharge Summary (Signed)
Physician Discharge Summary  Patient ID: [name] MRN: [id number] DOB/AGE: [birth year] 78 y.o.  Admit date: 01/21/2011 Discharge date: 01/26/2011  Primary Care Physician:  Vic Blackbird, MD, MD   Discharge Diagnoses:    Present on Admission:  .DIABETES MELLITUS, CONTROLLED, WITH COMPLICATIONS .HYPERLIPIDEMIA .ANEMIA OF RENAL FAILURE .ANXIETY .HYPERTENSION .Chronic respiratory failure .CKD (chronic kidney disease) stage 5, GFR less than 15 ml/min .CHF, acute on chronic  Current Discharge Medication List    START taking these medications   Details  ciprofloxacin (CIPRO) 250 MG tablet Take 1 tablet (250 mg total) by mouth daily. Qty: 2 tablet, Refills: 0    isosorbide mononitrate (IMDUR) 60 MG 24 hr tablet Take 1 tablet (60 mg total) by mouth daily. Qty: 30 tablet, Refills: 0    levothyroxine (SYNTHROID, LEVOTHROID) 25 MCG tablet Take 1 tablet (25 mcg total) by mouth daily before breakfast. Qty: 30 tablet, Refills: 0    metolazone (ZAROXOLYN) 2.5 MG tablet Take 1 tablet (2.5 mg total) by mouth daily. Qty: 30 tablet, Refills: 0      CONTINUE these medications which have CHANGED   Details  albuterol (PROVENTIL HFA;VENTOLIN HFA) 108 (90 BASE) MCG/ACT inhaler Inhale 2 puffs into the lungs every 2 (two) hours as needed for wheezing or shortness of breath (cough). Qty: 1 Inhaler, Refills: 0    cloNIDine (CATAPRES) 0.2 MG tablet Take 1 tablet (0.2 mg total) by mouth 3 (three) times daily. Qty: 90 tablet, Refills: 0    furosemide (LASIX) 80 MG tablet Take 2 tablets (160 mg total) by mouth 2 (two) times daily. Qty: 60 tablet, Refills: 0      CONTINUE these medications which have NOT CHANGED   Details  amLODipine (NORVASC) 10 MG tablet Take 10 mg by mouth every morning.      calcitRIOL (ROCALTROL) 0.25 MCG capsule Take 0.25 mcg by mouth every morning.     Carboxymethylcellul-Glycerin (OPTIVE) 0.5-0.9 % SOLN Apply to eye. One drop in both eyes two times a day        carvedilol (COREG) 25 MG tablet Take 1 tablet (25 mg total) by mouth 2 (two) times daily with a meal. Qty: 60 tablet, Refills: 1    citalopram (CELEXA) 20 MG tablet Take 20 mg by mouth every morning.      feeding supplement (GLUCERNA SHAKE) LIQD Take 237 mLs by mouth daily.      fluticasone (FLONASE) 50 MCG/ACT nasal spray 2 sprays by Nasal route daily.      hydrALAZINE (APRESOLINE) 100 MG tablet Take 1 tablet (100 mg total) by mouth every 8 (eight) hours. Qty: 90 tablet, Refills: 1    insulin aspart protamine-insulin aspart (NOVOLOG 70/30) (70-30) 100 UNIT/ML injection Inject 5-8 Units into the skin 2 (two) times daily with a meal. Inject 8 units every morning and inject 5 units every evening    iron polysaccharides (POLY-IRON 150) 150 MG capsule Take 150 mg by mouth every morning.     potassium chloride (K-DUR) 10 MEQ tablet Take 1 tablet (10 mEq total) by mouth daily. Take with your evening dose of lasix Qty: 30 tablet, Refills: 6    TENEX 2 MG tablet TAKE ONE TABLET BY MOUTH AT BEDTIME. Qty: 30 each, Refills: 3    ZOCOR 10 MG tablet TAKE (1) TABLET BY MOUTH IN THE EVENING. Qty: 30 each, Refills: 3         Disposition and Follow-up:  Pt is medically stable and ready for discharge to home. Will follow up with  Cardiology 1/24  Consults:  cardiology and renal  Physical exam:  General: Alert, awake, oriented x3, in no acute distress.  HEENT: No bruits, no goiter.  Heart: Regular rate and rhythm, rubs, gallops. Trace to 1+ LEE  Lungs: Normal effort. Breath sounds with fine crackles bilateral bases otherwise clear to auscultation bilaterally.  Abdomen:obese, Soft, nontender, nondistended, positive bowel sounds.  Extremities: No clubbing cyanosis with positive pedal pulses.  Neuro: Grossly intact, nonfocal. Speech clear.       Significant Diagnostic Studies:  Nm Pulmonary Per & Vent  01/22/2011  *RADIOLOGY REPORT*  Clinical Data:  Shortness of breath and elevated  D-dimer.  NUCLEAR MEDICINE VENTILATION - PERFUSION LUNG SCAN  Technique:  Wash-in, equilibrium, and wash-out phase ventilation images were obtained using Xe-133 gas.  Perfusion images were obtained in multiple projections after intravenous injection of Tc- 75m MAA.  Radiopharmaceuticals:  5.4 mCi Xe-133 gas and 5.5 mCi Tc-5m MAA.  Comparison:  Chest radiograph 01/21/2011  Findings: The perfusion images are essentially normal.  There are no peripheral or large perfusion abnormalities.  There is no evidence for a ventilation-perfusion mismatch.  There may be decreased ventilation at the left lung base compared to the perfusion.  IMPRESSION: Very low probability for pulmonary embolism.  Original Report Authenticated By: Markus Daft, M.D.   Dg Chest Port 1 View  01/21/2011  *RADIOLOGY REPORT*  Clinical Data: Shortness of breath.  PORTABLE CHEST - 1 VIEW  Comparison: Chest x-ray 01/19/2011.  Findings: The heart is enlarged but stable.  There is vascular congestion and mild interstitial pulmonary edema.  Small pleural effusions are noted along with left greater than right basilar atelectasis.  IMPRESSION: CHF.  Original Report Authenticated By: P. Kalman Jewels, M.D.    Labs Reviewed  CBC - Abnormal; Notable for the following:    RBC 3.83 (*)    Hemoglobin 8.8 (*)    HCT 27.9 (*)    MCV 72.8 (*)    MCH 23.0 (*)    RDW 16.5 (*)    All other components within normal limits  COMPREHENSIVE METABOLIC PANEL - Abnormal; Notable for the following:    Glucose, Bld 130 (*)    BUN 50 (*)    Creatinine, Ser 2.62 (*)    Albumin 3.1 (*)    GFR calc non Af Amer 16 (*)    GFR calc Af Amer 18 (*)    All other components within normal limits  PRO B NATRIURETIC PEPTIDE - Abnormal; Notable for the following:    Pro B Natriuretic peptide (BNP) 15325.0 (*)    All other components within normal limits  D-DIMER, QUANTITATIVE - Abnormal; Notable for the following:    D-Dimer, Quant 2.31 (*)    All other components  within normal limits  BASIC METABOLIC PANEL - Abnormal; Notable for the following:    Glucose, Bld 135 (*)    BUN 49 (*)    Creatinine, Ser 2.66 (*)    GFR calc non Af Amer 16 (*)    GFR calc Af Amer 18 (*)    All other components within normal limits  GLUCOSE, CAPILLARY - Abnormal; Notable for the following:    Glucose-Capillary 124 (*)    All other components within normal limits  GLUCOSE, CAPILLARY - Abnormal; Notable for the following:    Glucose-Capillary 151 (*)    All other components within normal limits  URINALYSIS, ROUTINE W REFLEX MICROSCOPIC - Abnormal; Notable for the following:    Protein, ur >300 (*)  Leukocytes, UA TRACE (*)    All other components within normal limits  PARATHYROID HORMONE, INTACT (NO CA) - Abnormal; Notable for the following:    PTH 346.4 (*)    All other components within normal limits  IRON AND TIBC - Abnormal; Notable for the following:    Iron 40 (*)    TIBC 169 (*)    All other components within normal limits  URINE MICROSCOPIC-ADD ON - Abnormal; Notable for the following:    Casts HYALINE CASTS (*)    All other components within normal limits  CBC - Abnormal; Notable for the following:    RBC 3.41 (*)    Hemoglobin 7.9 (*)    HCT 24.8 (*)    MCV 72.7 (*)    MCH 23.2 (*)    RDW 16.7 (*)    All other components within normal limits  COMPREHENSIVE METABOLIC PANEL - Abnormal; Notable for the following:    BUN 46 (*)    Creatinine, Ser 2.78 (*)    Total Protein 5.9 (*)    Albumin 2.6 (*)    GFR calc non Af Amer 15 (*)    GFR calc Af Amer 17 (*)    All other components within normal limits  GLUCOSE, CAPILLARY - Abnormal; Notable for the following:    Glucose-Capillary 114 (*)    All other components within normal limits  T3, FREE - Abnormal; Notable for the following:    T3, Free 2.2 (*)    All other components within normal limits  CBC - Abnormal; Notable for the following:    RBC 3.72 (*)    Hemoglobin 8.9 (*)    HCT 27.5 (*)     MCV 73.9 (*)    MCH 23.9 (*)    RDW 16.6 (*)    All other components within normal limits  RENAL FUNCTION PANEL - Abnormal; Notable for the following:    BUN 48 (*)    Creatinine, Ser 2.76 (*)    Albumin 2.6 (*)    GFR calc non Af Amer 15 (*)    GFR calc Af Amer 17 (*)    All other components within normal limits  DIFFERENTIAL - Abnormal; Notable for the following:    Monocytes Relative 14 (*)    All other components within normal limits  COMPREHENSIVE METABOLIC PANEL - Abnormal; Notable for the following:    BUN 48 (*)    Creatinine, Ser 2.81 (*)    Albumin 2.8 (*)    GFR calc non Af Amer 15 (*)    GFR calc Af Amer 17 (*)    All other components within normal limits  CBC - Abnormal; Notable for the following:    RBC 3.85 (*)    Hemoglobin 9.0 (*)    HCT 28.2 (*)    MCV 73.2 (*)    MCH 23.4 (*)    RDW 16.7 (*)    All other components within normal limits  COMPREHENSIVE METABOLIC PANEL - Abnormal; Notable for the following:    BUN 50 (*)    Creatinine, Ser 2.96 (*)    Albumin 2.8 (*)    GFR calc non Af Amer 14 (*)    GFR calc Af Amer 16 (*)    All other components within normal limits  DIFFERENTIAL - Abnormal; Notable for the following:    Monocytes Relative 15 (*)    All other components within normal limits  GLUCOSE, CAPILLARY - Abnormal; Notable for the following:  Glucose-Capillary 103 (*)    All other components within normal limits  RENAL FUNCTION PANEL - Abnormal; Notable for the following:    Glucose, Bld 121 (*)    BUN 51 (*)    Creatinine, Ser 3.16 (*)    Albumin 2.9 (*)    GFR calc non Af Amer 13 (*)    GFR calc Af Amer 15 (*)    All other components within normal limits  GLUCOSE, CAPILLARY - Abnormal; Notable for the following:    Glucose-Capillary 129 (*)    All other components within normal limits  DIFFERENTIAL  CARDIAC PANEL(CRET KIN+CKTOT+MB+TROPI)  CARDIAC PANEL(CRET KIN+CKTOT+MB+TROPI)  CARDIAC PANEL(CRET KIN+CKTOT+MB+TROPI)  CARDIAC  PANEL(CRET KIN+CKTOT+MB+TROPI)  DIFFERENTIAL  PHOSPHORUS  URINE CULTURE  GLUCOSE, CAPILLARY  PREPARE RBC (CROSSMATCH)  TSH  T4, FREE  TYPE AND SCREEN  ABO/RH  GLUCOSE, CAPILLARY  GLUCOSE, CAPILLARY  PHOSPHORUS  GLUCOSE, CAPILLARY  POCT CBG MONITORING        Ct Abdomen Pelvis Wo Contrast  01/15/2011  *RADIOLOGY REPORT*  Clinical Data: Upper abdominal pain, gynecologic cancer status post hysterectomy  CT ABDOMEN AND PELVIS WITHOUT CONTRAST  Technique:  Multidetector CT imaging of the abdomen and pelvis was performed following the standard protocol without intravenous contrast.  Comparison: 11/01/2009  Findings: Small bilateral pleural effusions.  Associated lower lobe opacities, possibly atelectasis.  No focal hepatic lesions.  Mild left pneumobilia, correlate for prior sphincterotomy.  Unenhanced spleen, pancreas, and adrenal glands are within normal limits.  Distended gallbladder with layering small gallstones (series 2/image 38), similar to 2011.  No associated inflammatory changes by CT.  No intrahepatic or extrahepatic ductal dilatation.  Bilateral renal cortical atrophy.  No renal calculi or hydronephrosis.  No evidence of bowel obstruction.  No colonic wall thickening or inflammatory changes.  Atherosclerotic calcifications of the abdominal aorta and branch vessels.  No abdominopelvic ascites.  No suspicious abdominopelvic lymphadenopathy.  Status post hysterectomy.  No adnexal masses.  Bladder is within normal limits.  Degenerative changes of the visualized thoracolumbar spine.  IMPRESSION: Distended gallbladder with cholelithiasis, similar to 2011.  No associated inflammatory changes by CT.  No evidence of bowel obstruction.  Small bilateral pleural effusions with associated lower lobe opacities, possibly atelectasis.  Original Report Authenticated By: Julian Hy, M.D.   Dg Chest 1 View  01/12/2011  *RADIOLOGY REPORT*  Clinical Data: Shortness of breath and weakness.  Question  congestive heart failure.  CHEST - 1 VIEW  Comparison: 01/09/2011 and 01/05/2011.  Findings: 1320 hours.  There is stable cardiomegaly with chronic vascular congestion and bibasilar atelectasis.  As before, there may be small bilateral pleural effusions.  There is no focal airspace disease or definite change compared with the prior studies.  The patient likely has a chronic rotator cuff tear on the left.  The thoracic esophagus appears dilated proximally.  IMPRESSION: Stable cardiomegaly, vascular congestion and bibasilar atelectasis.  Original Report Authenticated By: Vivia Ewing, M.D.   Dg Chest 1 View  01/05/2011  *RADIOLOGY REPORT*  Clinical Data: Fever, altered level of consciousness for several days  CHEST - 1 VIEW  Comparison: Portable chest x-ray of 12/27/2010  Findings: Moderate cardiomegaly remains with perhaps mild pulmonary vascular congestion.  Slight elevation of the left hemidiaphragm is stable.  No acute bony abnormality is seen.  IMPRESSION: Stable moderate cardiomegaly with question of mild pulmonary vascular congestion.  Original Report Authenticated By: Joretta Bachelor, M.D.   Dg Chest 2 View  01/19/2011  *RADIOLOGY REPORT*  Clinical Data: History  of labored breathing.  History of congestive heart failure.  Follow-up.  CHEST - 2 VIEW  Comparison: 01/17/2011.  Findings: There is stable enlargement of the cardiac silhouette. Ectasia and tortuosity of thoracic aorta is seen.  There is moderate upper lobe vascular prominence consistent with mild pulmonary venous hypertension vascular congestion.  There is minimal right basilar atelectasis.  There is continued elevation of the left hemidiaphragm with moderate atelectasis in the left lung base.  No consolidation or definite pleural effusion is seen. There is osteopenic appearance of the bones.  IMPRESSION: Stable enlargement cardiac silhouette.  Moderate vascular congestion pattern.  Minimal right basilar atelectasis.  Continued elevation of  the left hemidiaphragm with moderate atelectasis in the left lung base.  No consolidation or definite pleural effusion is seen.  Original Report Authenticated By: Delane Ginger, M.D.   Dg Hip Complete Right  01/06/2011  *RADIOLOGY REPORT*  Clinical Data: Fall, right-sided hip and ankle pain  RIGHT HIP - COMPLETE 2+ VIEW  Comparison: Plain films 06/15/2010  Findings: Hips are located.  Dedicated views of the right hip demonstrate no femoral neck fracture.  No pelvic fracture or sacral fracture.  Moderate volume stool in the ascending colon.  IMPRESSION: No evidence of pelvic fracture or left hip fracture.  Original Report Authenticated By: Suzy Bouchard, M.D.   Dg Ankle 2 Views Right  01/06/2011  *RADIOLOGY REPORT*  Clinical Data: Right-sided pain, ankle pain  RIGHT ANKLE - 2 VIEW  Comparison: None.  Findings: Ankle mortise intact.  The talar dome is normal.  No evidence of malleolar fracture.  Osteopenia.  IMPRESSION: No evidence of fracture on two views of the ankle.  Original Report Authenticated By: Suzy Bouchard, M.D.   Ct Head Wo Contrast  01/11/2011  *RADIOLOGY REPORT*  Clinical Data: Headache.  History of CVA with residual right-sided weakness status post fall 1 week ago.  Evaluate for progressive bleeding.  CT HEAD WITHOUT CONTRAST  Technique:  Contiguous axial images were obtained from the base of the skull through the vertex without contrast.  Comparison: CT 01/05/2011.  MRI 01/06/2011.  Findings: A small amount of intraventricular hemorrhage was noted layering in the occipital horns on the prior examinations. This is not visible on the current examination.  There is no evidence of progressive hemorrhage or extra-axial fluid collection.  There is no evidence of acute infarction.  Atrophy and mild chronic small vessel extending changes are stable.  There is mild mucosal thickening in the right ethmoid and frontal sinuses.  The calvarium is intact.  IMPRESSION: Stable examination with atrophy  and chronic small vessel ischemic changes.  No evidence of progressive hemorrhage or hydrocephalus.  Original Report Authenticated By: Vivia Ewing, M.D.   Ct Head Wo Contrast  01/05/2011  *RADIOLOGY REPORT*  Clinical Data:  Of 2 days ago with bruising in the right side of the neck, altered mental status, weakness  CT HEAD WITHOUT CONTRAST CT CERVICAL SPINE WITHOUT CONTRAST  Technique:  Multidetector CT imaging of the head and cervical spine was performed following the standard protocol without intravenous contrast.  Multiplanar CT image reconstructions of the cervical spine were also generated.  Comparison:  CT brain scan of 04/30/2010  CT HEAD  Findings: The ventricular system remains prominent, as are the cortical sulci and cerebellar folia, consistent with diffuse atrophy.  The septum is midline position mild small vessel ischemic changes present in the prep ventricle white matter.  No hemorrhage, mass lesion, or acute infarction is seen.  There is mucosal  thickening in the left maxillary sinus but no air-fluid level is seen.  Some opacification of ethmoid air cells is present and there is a small amount of fluid within the left partition of the sphenoid sinus as well the opacification of the right frontal sinus is noted.  On bone window images no acute calvarial abnormality is seen.  IMPRESSION:  1.  Stable atrophy and small vessel disease.  No acute intracranial abnormality. 2.  Diffuse sinus disease.  CT CERVICAL SPINE  Findings: The cervical vertebrae are slightly straightened in alignment.  There is diffuse degenerative change with some loss of joint space and spurring.  However no acute cervical spine fracture is seen.  The odontoid process is intact.  No prevertebral soft tissue swelling is noted.  There are the thyroid gland is slightly prominent and inhomogeneous with possible low attenuation nodule on the left.  IMPRESSION: Straightened alignment with diffuse degenerative change in osteopenia.   No acute abnormality.  Original Report Authenticated By: Joretta Bachelor, M.D.   Ct Cervical Spine Wo Contrast  01/05/2011  *RADIOLOGY REPORT*  Clinical Data:  Of 2 days ago with bruising in the right side of the neck, altered mental status, weakness  CT HEAD WITHOUT CONTRAST CT CERVICAL SPINE WITHOUT CONTRAST  Technique:  Multidetector CT imaging of the head and cervical spine was performed following the standard protocol without intravenous contrast.  Multiplanar CT image reconstructions of the cervical spine were also generated.  Comparison:  CT brain scan of 04/30/2010  CT HEAD  Findings: The ventricular system remains prominent, as are the cortical sulci and cerebellar folia, consistent with diffuse atrophy.  The septum is midline position mild small vessel ischemic changes present in the prep ventricle white matter.  No hemorrhage, mass lesion, or acute infarction is seen.  There is mucosal thickening in the left maxillary sinus but no air-fluid level is seen.  Some opacification of ethmoid air cells is present and there is a small amount of fluid within the left partition of the sphenoid sinus as well the opacification of the right frontal sinus is noted.  On bone window images no acute calvarial abnormality is seen.  IMPRESSION:  1.  Stable atrophy and small vessel disease.  No acute intracranial abnormality. 2.  Diffuse sinus disease.  CT CERVICAL SPINE  Findings: The cervical vertebrae are slightly straightened in alignment.  There is diffuse degenerative change with some loss of joint space and spurring.  However no acute cervical spine fracture is seen.  The odontoid process is intact.  No prevertebral soft tissue swelling is noted.  There are the thyroid gland is slightly prominent and inhomogeneous with possible low attenuation nodule on the left.  IMPRESSION: Straightened alignment with diffuse degenerative change in osteopenia.  No acute abnormality.  Original Report Authenticated By: Joretta Bachelor, M.D.   Mr Brain Wo Contrast  01/06/2011  *RADIOLOGY REPORT*  Clinical Data: Evaluated for possible stroke.  Altered mental status.  Recent fall  MRI HEAD WITHOUT CONTRAST  Technique:  Multiplanar, multiecho pulse sequences of the brain and surrounding structures were obtained according to standard protocol without intravenous contrast.  Comparison: CT head 01/05/2011.  Findings: The patient was moderately uncooperative and could not remain motionless for the study.  Images are suboptimal.  Small or subtle lesions could be overlooked.  Motion degraded diffusion weighted images show no acute stroke. There is advanced atrophy with chronic microvascular ischemic change. A small osteoma projects from the inner table of the calvarium  in the right frontal bone.  There are small foci of intraventricular hemorrhage layering posteriorly on gradient sequence left greater than right (images 11 and 12 of series 10) reflecting previous closed head injury.  No parenchymal bleed is seen.  There is no midline shift.  Intracranial vasculature is grossly patent. Pituitary and cerebellar tonsils grossly unremarkable.  The foci of hemorrhage are better seen on MR than on CT.  IMPRESSION: No acute stroke.  Subtle interventricular hemorrhage reflecting previous closed head injury.  Atrophy and small vessel disease.  Original Report Authenticated By: Staci Righter, M.D.   Nm Pulmonary Per & Vent  01/22/2011  *RADIOLOGY REPORT*  Clinical Data:  Shortness of breath and elevated D-dimer.  NUCLEAR MEDICINE VENTILATION - PERFUSION LUNG SCAN  Technique:  Wash-in, equilibrium, and wash-out phase ventilation images were obtained using Xe-133 gas.  Perfusion images were obtained in multiple projections after intravenous injection of Tc- 54m MAA.  Radiopharmaceuticals:  5.4 mCi Xe-133 gas and 5.5 mCi Tc-75m MAA.  Comparison:  Chest radiograph 01/21/2011  Findings: The perfusion images are essentially normal.  There are no peripheral  or large perfusion abnormalities.  There is no evidence for a ventilation-perfusion mismatch.  There may be decreased ventilation at the left lung base compared to the perfusion.  IMPRESSION: Very low probability for pulmonary embolism.  Original Report Authenticated By: Markus Daft, M.D.   Dg Chest Port 1 View  01/21/2011  *RADIOLOGY REPORT*  Clinical Data: Shortness of breath.  PORTABLE CHEST - 1 VIEW  Comparison: Chest x-ray 01/19/2011.  Findings: The heart is enlarged but stable.  There is vascular congestion and mild interstitial pulmonary edema.  Small pleural effusions are noted along with left greater than right basilar atelectasis.  IMPRESSION: CHF.  Original Report Authenticated By: P. Kalman Jewels, M.D.   Dg Chest Port 1 View  01/17/2011  *RADIOLOGY REPORT*  Clinical Data: Short of breath.  Weakness.  PORTABLE CHEST - 1 VIEW  Comparison: 01/12/2011.  Findings: Cardiomegaly with pulmonary vascular congestion and interstitial pulmonary edema.  The chronic elevation of the left hemidiaphragm with left basilar atelectasis.  Tortuous thoracic aorta.  IMPRESSION: Cardiomegaly and interstitial pulmonary edema compatible with mild CHF.  Original Report Authenticated By: Dereck Ligas, M.D.   Dg Chest Port 1 View  01/09/2011  *RADIOLOGY REPORT*  Clinical Data: Question of CHF.  Shortness of breath.  PORTABLE CHEST - 1 VIEW  Comparison: 01/05/2011  Findings: Heart is enlarged.  The film is made with shallow lung inflation.  There is pulmonary vascular congestion but no overt edema.  There are no focal consolidations.  There is minimal left lower lobe subsegmental atelectasis.  Small bilateral pleural effusions are suspected.  IMPRESSION:  1.  Cardiomegaly and vascular congestion. 2.  Left base atelectasis. 3.  Small bilateral pleural effusions are suspected.  Original Report Authenticated By: Glenice Bow, M.D.   Dg Knee Complete 4 Views Right  01/05/2011  *RADIOLOGY REPORT*  Clinical Data: [place]  several days ago with pain  RIGHT KNEE - COMPLETE 4+ VIEW  Comparison: MR right knee of 02/25/2010  Findings: Tricompartmental degenerative joint disease is again noted with loss of joint space, sclerosis and spurring involving the compartment.  There is also chondrocalcinosis present which can be seen with CPPD or osteoarthritis.  No acute fracture is seen.  A small right knee joint effusion is present.  IMPRESSION: 1.  Significant tricompartmental degenerative joint disease with small joint effusion.  No fracture. 2.  Chondrocalcinosis.  Original Report  Authenticated By: Joretta Bachelor, M.D.   Dg Abd Acute W/chest  01/15/2011  *RADIOLOGY REPORT*  Clinical Data: Abdominal pain  ACUTE ABDOMEN SERIES (ABDOMEN 2 VIEW & CHEST 1 VIEW)  Comparison: CT abdomen pelvis dated 11/02/2010  Findings: Cardiomegaly with pulmonary vascular congestion.  No frank interstitial edema. No pleural effusion or pneumothorax.  Nonobstructive bowel gas pattern.  Moderate stool throughout the colon.  No evidence of free air under the diaphragm on the upright view.  Degenerative changes of the visualized thoracolumbar spine.  Vascular calcifications.  IMPRESSION: Cardiomegaly with pulmonary vascular congestion.  No frank interstitial edema.  No evidence of small bowel obstruction or free air.  Moderate stool throughout the colon.  Original Report Authenticated By: Julian Hy, M.D.       Brief H and P: For complete details please refer to admission H and P, but in brief   Patient is 78 year old [place] female with multiple medical problems including diabetes mellitus, hypertension, history of prior stroke with residual right-sided weakness, history of chronic kidney disease stage V with left AV fistula but not on hemodialysis yet, presented to [facility] emergency room with shortness of breath started  On 01/21/11. History was obtained from the patient and her daughter-in-law present in the room. Per patient, she was just  discharged from [facility] 01/20/11. Per patient she had felt better yesterday however she missed all her medications in the evening. This morning her caregiver noted that she was more short of breath and also complaint of chest pain. Patient stated that the chest pain was chest tightness, midsternal, 8/10 intensity with no radiation and difficulty breathing. At the time of my examination the chest pain had completely resolved. Patient had no fever or chills or any wheezing, palpitations, no nausea or vomiting. Pt BNP 12,865. Pt admitted to tele for evaluation and treatment    Hospital Course:  No resolved problems to display.  Active Hospital Problems  Diagnoses Date Noted   . CHF, acute on chronic 01/21/2011   . CKD (chronic kidney disease) stage 5, GFR less than 15 ml/min 01/21/2011   . Chronic respiratory failure 01/13/2011   . DIABETES MELLITUS, CONTROLLED, WITH COMPLICATIONS [id number]   . HYPERLIPIDEMIA 10/10/2006   . ANEMIA OF RENAL FAILURE 10/10/2006   . ANXIETY 09/05/2006   . HYPERTENSION 09/05/2006     Resolved Hospital Problems  Diagnoses Date Noted Date Resolved    Principal Problem: 1. congestive heart failure questionable diastolic dysfunction. Pt admitted to tele. Given IV lasix and diuresed 4L. Lasix converted to po. Pt seen by cardiology. At time of discharge much improved. will continue Lasix as well as carvedilol. Cardiology increased Imdur  to 60mg . Also recommeded mectolazone at discharge. Admission wt. 92.9kg. Wt at discharge 83.4  #2 chronic kidney disease stage 4/5- stable. patient is on calcitriol also been followed by nephrology. Appreciate assistance. Renal lowered lasix dose and Ok's for discharge. Will need dialysis soon per renal.  #3 anemia-most likely of chronic disoder status post packed RBC transfusion. Hg 9.0 from 7.9 on 1/12, Patient also to continue on iron and aranesp . No s/sx blding.  #4 diabetes mellitus- controlled. Continue home regimen of  novolg 70/30 .  #5 hypertension-blood pressure . Seen by cardiology and medications adjusted as above. At time of discharge SBP range 120-159 and DBP range 59-74. .  #6 hyperlipidemia-continue Zocor  #7 history of anxiety disorder- remained clinically stable during this hospitalization #8 recurrent cold- TSH 1.8, Free T3 2.2 Free T4  1.2. Will start synthroid 57mcg po. Will need OP F/U in 3 weeks.   Patient seen and fully evaluated. Examined by me. Fully agree with discharge summary Jackie Plum    [id number]  Time spent on Discharge: 40 minutes  Signed: Radene Gunning 01/26/2011, 10:46 AM

## 2011-01-31 ENCOUNTER — Inpatient Hospital Stay (HOSPITAL_COMMUNITY)
Admission: EM | Admit: 2011-01-31 | Discharge: 2011-02-05 | DRG: 308 | Payer: Medicare HMO | Attending: Internal Medicine | Admitting: Internal Medicine

## 2011-01-31 ENCOUNTER — Other Ambulatory Visit: Payer: Self-pay

## 2011-01-31 ENCOUNTER — Emergency Department (HOSPITAL_COMMUNITY): Payer: Medicare HMO

## 2011-01-31 ENCOUNTER — Encounter (HOSPITAL_COMMUNITY): Payer: Self-pay | Admitting: Emergency Medicine

## 2011-01-31 DIAGNOSIS — K635 Polyp of colon: Secondary | ICD-10-CM

## 2011-01-31 DIAGNOSIS — E1129 Type 2 diabetes mellitus with other diabetic kidney complication: Secondary | ICD-10-CM | POA: Diagnosis present

## 2011-01-31 DIAGNOSIS — I498 Other specified cardiac arrhythmias: Principal | ICD-10-CM | POA: Diagnosis present

## 2011-01-31 DIAGNOSIS — N390 Urinary tract infection, site not specified: Secondary | ICD-10-CM | POA: Diagnosis present

## 2011-01-31 DIAGNOSIS — Z8673 Personal history of transient ischemic attack (TIA), and cerebral infarction without residual deficits: Secondary | ICD-10-CM

## 2011-01-31 DIAGNOSIS — N289 Disorder of kidney and ureter, unspecified: Secondary | ICD-10-CM

## 2011-01-31 DIAGNOSIS — E118 Type 2 diabetes mellitus with unspecified complications: Secondary | ICD-10-CM

## 2011-01-31 DIAGNOSIS — N186 End stage renal disease: Secondary | ICD-10-CM | POA: Diagnosis present

## 2011-01-31 DIAGNOSIS — Z8679 Personal history of other diseases of the circulatory system: Secondary | ICD-10-CM

## 2011-01-31 DIAGNOSIS — H269 Unspecified cataract: Secondary | ICD-10-CM

## 2011-01-31 DIAGNOSIS — R001 Bradycardia, unspecified: Secondary | ICD-10-CM | POA: Diagnosis present

## 2011-01-31 DIAGNOSIS — K589 Irritable bowel syndrome without diarrhea: Secondary | ICD-10-CM

## 2011-01-31 DIAGNOSIS — M129 Arthropathy, unspecified: Secondary | ICD-10-CM

## 2011-01-31 DIAGNOSIS — F411 Generalized anxiety disorder: Secondary | ICD-10-CM

## 2011-01-31 DIAGNOSIS — J309 Allergic rhinitis, unspecified: Secondary | ICD-10-CM

## 2011-01-31 DIAGNOSIS — I509 Heart failure, unspecified: Secondary | ICD-10-CM | POA: Diagnosis present

## 2011-01-31 DIAGNOSIS — D631 Anemia in chronic kidney disease: Secondary | ICD-10-CM | POA: Diagnosis present

## 2011-01-31 DIAGNOSIS — N039 Chronic nephritic syndrome with unspecified morphologic changes: Secondary | ICD-10-CM

## 2011-01-31 DIAGNOSIS — R42 Dizziness and giddiness: Secondary | ICD-10-CM | POA: Diagnosis present

## 2011-01-31 DIAGNOSIS — K5909 Other constipation: Secondary | ICD-10-CM | POA: Diagnosis present

## 2011-01-31 DIAGNOSIS — I5032 Chronic diastolic (congestive) heart failure: Secondary | ICD-10-CM | POA: Diagnosis present

## 2011-01-31 DIAGNOSIS — R404 Transient alteration of awareness: Secondary | ICD-10-CM

## 2011-01-31 DIAGNOSIS — I10 Essential (primary) hypertension: Secondary | ICD-10-CM

## 2011-01-31 DIAGNOSIS — R0602 Shortness of breath: Secondary | ICD-10-CM

## 2011-01-31 DIAGNOSIS — E876 Hypokalemia: Secondary | ICD-10-CM | POA: Diagnosis present

## 2011-01-31 DIAGNOSIS — K5900 Constipation, unspecified: Secondary | ICD-10-CM | POA: Diagnosis present

## 2011-01-31 DIAGNOSIS — N179 Acute kidney failure, unspecified: Secondary | ICD-10-CM | POA: Diagnosis present

## 2011-01-31 DIAGNOSIS — R112 Nausea with vomiting, unspecified: Secondary | ICD-10-CM

## 2011-01-31 DIAGNOSIS — E785 Hyperlipidemia, unspecified: Secondary | ICD-10-CM | POA: Diagnosis present

## 2011-01-31 DIAGNOSIS — E119 Type 2 diabetes mellitus without complications: Secondary | ICD-10-CM | POA: Diagnosis present

## 2011-01-31 DIAGNOSIS — R609 Edema, unspecified: Secondary | ICD-10-CM

## 2011-01-31 DIAGNOSIS — G4700 Insomnia, unspecified: Secondary | ICD-10-CM

## 2011-01-31 DIAGNOSIS — Z992 Dependence on renal dialysis: Secondary | ICD-10-CM

## 2011-01-31 DIAGNOSIS — Z794 Long term (current) use of insulin: Secondary | ICD-10-CM

## 2011-01-31 DIAGNOSIS — J9610 Chronic respiratory failure, unspecified whether with hypoxia or hypercapnia: Secondary | ICD-10-CM

## 2011-01-31 DIAGNOSIS — S40029A Contusion of unspecified upper arm, initial encounter: Secondary | ICD-10-CM

## 2011-01-31 DIAGNOSIS — I5033 Acute on chronic diastolic (congestive) heart failure: Secondary | ICD-10-CM | POA: Diagnosis present

## 2011-01-31 DIAGNOSIS — I132 Hypertensive heart and chronic kidney disease with heart failure and with stage 5 chronic kidney disease, or end stage renal disease: Secondary | ICD-10-CM | POA: Diagnosis present

## 2011-01-31 DIAGNOSIS — R17 Unspecified jaundice: Secondary | ICD-10-CM | POA: Diagnosis present

## 2011-01-31 DIAGNOSIS — R55 Syncope and collapse: Secondary | ICD-10-CM | POA: Diagnosis present

## 2011-01-31 DIAGNOSIS — I1311 Hypertensive heart and chronic kidney disease without heart failure, with stage 5 chronic kidney disease, or end stage renal disease: Secondary | ICD-10-CM | POA: Diagnosis present

## 2011-01-31 DIAGNOSIS — E871 Hypo-osmolality and hyponatremia: Secondary | ICD-10-CM | POA: Diagnosis present

## 2011-01-31 DIAGNOSIS — D649 Anemia, unspecified: Secondary | ICD-10-CM

## 2011-01-31 DIAGNOSIS — R0989 Other specified symptoms and signs involving the circulatory and respiratory systems: Secondary | ICD-10-CM

## 2011-01-31 DIAGNOSIS — W19XXXA Unspecified fall, initial encounter: Secondary | ICD-10-CM

## 2011-01-31 LAB — DIFFERENTIAL
Basophils Absolute: 0 10*3/uL (ref 0.0–0.1)
Eosinophils Absolute: 0 10*3/uL (ref 0.0–0.7)
Eosinophils Relative: 0 % (ref 0–5)
Monocytes Absolute: 0.8 10*3/uL (ref 0.1–1.0)

## 2011-01-31 LAB — URINALYSIS, ROUTINE W REFLEX MICROSCOPIC
Bilirubin Urine: NEGATIVE
Ketones, ur: NEGATIVE mg/dL
Leukocytes, UA: NEGATIVE
Nitrite: NEGATIVE
Protein, ur: 30 mg/dL — AB
Urobilinogen, UA: 0.2 mg/dL (ref 0.0–1.0)

## 2011-01-31 LAB — BASIC METABOLIC PANEL
BUN: 66 mg/dL — ABNORMAL HIGH (ref 6–23)
Calcium: 9 mg/dL (ref 8.4–10.5)
Creatinine, Ser: 3.71 mg/dL — ABNORMAL HIGH (ref 0.50–1.10)
GFR calc Af Amer: 12 mL/min — ABNORMAL LOW (ref >90)
GFR calc non Af Amer: 10 mL/min — ABNORMAL LOW (ref >90)

## 2011-01-31 LAB — GLUCOSE, CAPILLARY: Glucose-Capillary: 260 mg/dL — ABNORMAL HIGH (ref 70–99)

## 2011-01-31 LAB — CBC
HCT: 32.4 % — ABNORMAL LOW (ref 36.0–46.0)
MCHC: 33.3 g/dL (ref 30.0–36.0)
Platelets: 169 10*3/uL (ref 150–400)
RDW: 18.1 % — ABNORMAL HIGH (ref 11.5–15.5)

## 2011-01-31 LAB — TROPONIN I: Troponin I: <0.3 ng/mL (ref <0.30)

## 2011-01-31 MED ORDER — ACETAMINOPHEN 325 MG PO TABS: 650.0000 mg | ORAL_TABLET | Freq: Four times a day (QID) | ORAL | Status: DC | PRN

## 2011-01-31 MED ORDER — SIMVASTATIN 10 MG PO TABS
10.0000 mg | ORAL_TABLET | Freq: Every day | ORAL | Status: DC
  Administered 2011-02-01 – 2011-02-04 (×4): 10 mg via ORAL
  Filled 2011-01-31 (×4): qty 1

## 2011-01-31 MED ORDER — AMLODIPINE BESYLATE 5 MG PO TABS
10.0000 mg | ORAL_TABLET | ORAL | Status: DC
  Administered 2011-02-01 – 2011-02-03 (×3): 10 mg via ORAL
  Administered 2011-02-04: 5 mg via ORAL
  Administered 2011-02-05: 10 mg via ORAL
  Filled 2011-01-31 (×3): qty 2
  Filled 2011-01-31: qty 1
  Filled 2011-01-31: qty 2

## 2011-01-31 MED ORDER — INSULIN ASPART 100 UNIT/ML ~~LOC~~ SOLN
0.0000 [IU] | Freq: Three times a day (TID) | SUBCUTANEOUS | Status: DC
Start: 2011-02-01 — End: 2011-02-05
  Administered 2011-02-01 (×2): 2 [IU] via SUBCUTANEOUS
  Administered 2011-02-02: 3 [IU] via SUBCUTANEOUS
  Administered 2011-02-02 – 2011-02-04 (×3): 2 [IU] via SUBCUTANEOUS
  Administered 2011-02-04 (×2): 1 [IU] via SUBCUTANEOUS
  Filled 2011-01-31: qty 3

## 2011-01-31 MED ORDER — DOCUSATE SODIUM 100 MG PO CAPS
100.0000 mg | ORAL_CAPSULE | Freq: Two times a day (BID) | ORAL | Status: DC
  Administered 2011-02-01 – 2011-02-05 (×10): 100 mg via ORAL
  Filled 2011-01-31 (×10): qty 1

## 2011-01-31 MED ORDER — LEVOTHYROXINE SODIUM 25 MCG PO TABS
25.0000 ug | ORAL_TABLET | Freq: Every day | ORAL | Status: DC
  Administered 2011-02-01 – 2011-02-05 (×5): 25 ug via ORAL
  Filled 2011-01-31 (×5): qty 1

## 2011-01-31 MED ORDER — ALBUTEROL SULFATE HFA 108 (90 BASE) MCG/ACT IN AERS: 2.0000 | INHALATION_SPRAY | RESPIRATORY_TRACT | Status: DC | PRN

## 2011-01-31 MED ORDER — BISACODYL 5 MG PO TBEC
5.0000 mg | DELAYED_RELEASE_TABLET | Freq: Every day | ORAL | Status: DC | PRN
  Administered 2011-02-03 – 2011-02-05 (×2): 5 mg via ORAL
  Filled 2011-01-31 (×2): qty 1

## 2011-01-31 MED ORDER — FUROSEMIDE 80 MG PO TABS
160.0000 mg | ORAL_TABLET | Freq: Two times a day (BID) | ORAL | Status: DC
  Administered 2011-02-01: 160 mg via ORAL
  Filled 2011-01-31: qty 2

## 2011-01-31 MED ORDER — ALBUTEROL SULFATE (5 MG/ML) 0.5% IN NEBU: 2.5000 mg | INHALATION_SOLUTION | RESPIRATORY_TRACT | Status: DC | PRN

## 2011-01-31 MED ORDER — SODIUM CHLORIDE 0.9 % IJ SOLN
3.0000 mL | Freq: Two times a day (BID) | INTRAMUSCULAR | Status: DC
  Administered 2011-02-01 – 2011-02-04 (×9): 3 mL via INTRAVENOUS
  Filled 2011-01-31 (×10): qty 3

## 2011-01-31 MED ORDER — ONDANSETRON HCL 4 MG PO TABS: 4.0000 mg | ORAL_TABLET | Freq: Four times a day (QID) | ORAL | Status: DC | PRN

## 2011-01-31 MED ORDER — METOLAZONE 5 MG PO TABS
2.5000 mg | ORAL_TABLET | Freq: Every day | ORAL | Status: DC
  Administered 2011-02-01: 2.5 mg via ORAL
  Filled 2011-01-31: qty 1

## 2011-01-31 MED ORDER — HEPARIN SODIUM (PORCINE) 5000 UNIT/ML IJ SOLN
5000.0000 [IU] | Freq: Three times a day (TID) | INTRAMUSCULAR | Status: DC
  Administered 2011-02-01 – 2011-02-05 (×13): 5000 [IU] via SUBCUTANEOUS
  Filled 2011-01-31 (×13): qty 1

## 2011-01-31 MED ORDER — ONDANSETRON HCL 4 MG/2ML IJ SOLN: 4.0000 mg | Freq: Four times a day (QID) | INTRAMUSCULAR | Status: DC | PRN

## 2011-01-31 MED ORDER — CITALOPRAM HYDROBROMIDE 20 MG PO TABS
20.0000 mg | ORAL_TABLET | ORAL | Status: DC
  Administered 2011-02-01 – 2011-02-05 (×5): 20 mg via ORAL
  Filled 2011-01-31 (×5): qty 1

## 2011-01-31 MED ORDER — ISOSORBIDE MONONITRATE ER 60 MG PO TB24
60.0000 mg | ORAL_TABLET | Freq: Every day | ORAL | Status: DC
  Administered 2011-02-01 – 2011-02-05 (×5): 60 mg via ORAL
  Filled 2011-01-31 (×5): qty 1

## 2011-01-31 MED ORDER — ASPIRIN EC 81 MG PO TBEC
81.0000 mg | DELAYED_RELEASE_TABLET | Freq: Every day | ORAL | Status: DC
  Administered 2011-02-01 – 2011-02-05 (×5): 81 mg via ORAL
  Filled 2011-01-31 (×5): qty 1

## 2011-01-31 MED ORDER — GUANFACINE HCL 2 MG PO TABS
2.0000 mg | ORAL_TABLET | Freq: Every day | ORAL | Status: DC
  Filled 2011-01-31 (×2): qty 1

## 2011-01-31 MED ORDER — ACETAMINOPHEN 650 MG RE SUPP: 650.0000 mg | Freq: Four times a day (QID) | RECTAL | Status: DC | PRN

## 2011-01-31 MED ORDER — POTASSIUM CHLORIDE CRYS ER 10 MEQ PO TBCR
10.0000 meq | EXTENDED_RELEASE_TABLET | Freq: Every day | ORAL | Status: DC
  Administered 2011-02-01: 10 meq via ORAL
  Filled 2011-01-31 (×2): qty 1

## 2011-01-31 MED ORDER — CALCITRIOL 0.25 MCG PO CAPS
0.2500 ug | ORAL_CAPSULE | ORAL | Status: DC
  Administered 2011-02-01 – 2011-02-05 (×5): 0.25 ug via ORAL
  Filled 2011-01-31 (×8): qty 1

## 2011-01-31 MED ORDER — INSULIN ASPART PROT & ASPART (70-30 MIX) 100 UNIT/ML ~~LOC~~ SUSP
5.0000 [IU] | Freq: Two times a day (BID) | SUBCUTANEOUS | Status: DC
  Administered 2011-02-02 – 2011-02-03 (×3): 5 [IU] via SUBCUTANEOUS
  Filled 2011-01-31: qty 3

## 2011-01-31 MED ORDER — HYDRALAZINE HCL 25 MG PO TABS
100.0000 mg | ORAL_TABLET | Freq: Three times a day (TID) | ORAL | Status: DC
  Administered 2011-02-01 – 2011-02-05 (×11): 100 mg via ORAL
  Filled 2011-01-31 (×3): qty 4
  Filled 2011-01-31: qty 3
  Filled 2011-01-31 (×4): qty 4
  Filled 2011-01-31: qty 1
  Filled 2011-01-31 (×4): qty 4

## 2011-01-31 MED ORDER — CLONIDINE HCL 0.2 MG PO TABS
0.2000 mg | ORAL_TABLET | Freq: Three times a day (TID) | ORAL | Status: DC
  Administered 2011-02-01 (×2): 0.2 mg via ORAL
  Filled 2011-01-31 (×2): qty 1

## 2011-01-31 NOTE — ED Notes (Signed)
IV site "pulled out" while in ct, RN attempting access at this time, pt has lt arm restriction and has already be "attempted access" 3 x, Dr. Zollie Scale made aware, report called to [facility] RN

## 2011-01-31 NOTE — ED Notes (Signed)
CBG 260

## 2011-01-31 NOTE — ED Notes (Signed)
  Pt transported to ct 

## 2011-01-31 NOTE — H&P (Signed)
[name] is an 78 y.o. female.    PCP: Vic Blackbird, MD, MD  Her cardiologist is Dr. Lattie Haw  Chief Complaint: Passed out  HPI: This is 78 year old, African American female, with a past medical history of diastolic heart failure, chronic kidney disease, diabetes, hypertension, who was recently discharged from [facility] hospital on Jan 26 2011. She was admitted for diastolic heart failure. Patient was in her usual state of health earlier today when she was lying down on her recliner. She went to close her door, and, as she was getting to the door she felt a swimming sensation in the head and then she fell down. She may have lost consciousness for a few minutes. She was helped by her neighbor and EMS was called. She denies any chest pain or shortness of breath either prior to or after the event. Denies any nausea, vomiting. Has been having some discomfort in the left lower abdomen, which she attributes to constipation. Denies palpitations. Her last bowel movement was 2 days ago. When she was hospitalized at [facility] long. She was started on new medications, including metolazone and her dose of clonidine was increased.   Home Medications: Prior to Admission medications   Medication Sig Start Date End Date Taking? Authorizing Provider  albuterol (PROVENTIL HFA;VENTOLIN HFA) 108 (90 BASE) MCG/ACT inhaler Inhale 2 puffs into the lungs every 2 (two) hours as needed for wheezing or shortness of breath (cough). 01/25/11 01/25/12 Yes Lezlie Octave Black, NP  amLODipine (NORVASC) 10 MG tablet Take 10 mg by mouth every morning.   01/13/11 01/13/12 Yes Kathie Dike, MD  aspirin EC 81 MG tablet Take 81 mg by mouth daily.   Yes Historical Provider, MD  calcitRIOL (ROCALTROL) 0.25 MCG capsule Take 0.25 mcg by mouth every morning.    Yes Historical Provider, MD  Carboxymethylcellul-Glycerin (OPTIVE) 0.5-0.9 % SOLN Apply to eye. One drop in both eyes two times a day     Yes Historical Provider, MD  carvedilol  (COREG) 25 MG tablet Take 1 tablet (25 mg total) by mouth 2 (two) times daily with a meal. 01/13/11 01/13/12 Yes Kathie Dike, MD  ciprofloxacin (CIPRO) 250 MG tablet Take 1 tablet (250 mg total) by mouth daily. 01/25/11 02/04/11 Yes Lezlie Octave Black, NP  citalopram (CELEXA) 20 MG tablet Take 20 mg by mouth every morning.     Yes Historical Provider, MD  cloNIDine (CATAPRES) 0.2 MG tablet Take 1 tablet (0.2 mg total) by mouth 3 (three) times daily. 01/25/11 01/25/12 Yes Lezlie Octave Black, NP  feeding supplement (GLUCERNA SHAKE) LIQD Take 237 mLs by mouth daily.     Yes Historical Provider, MD  fluticasone (FLONASE) 50 MCG/ACT nasal spray 2 sprays by Nasal route daily.     Yes Historical Provider, MD  furosemide (LASIX) 80 MG tablet Take 2 tablets (160 mg total) by mouth 2 (two) times daily. 01/25/11 01/25/12 Yes Lezlie Octave Black, NP  hydrALAZINE (APRESOLINE) 100 MG tablet Take 1 tablet (100 mg total) by mouth every 8 (eight) hours. 01/13/11 01/13/12 Yes Kathie Dike, MD  insulin aspart protamine-insulin aspart (NOVOLOG 70/30) (70-30) 100 UNIT/ML injection Inject 5-8 Units into the skin 2 (two) times daily with a meal. Inject 8 units every morning and inject 5 units every evening 01/20/11  Yes Nimish C Anastasio Champion, MD  iron polysaccharides (POLY-IRON 150) 150 MG capsule Take 150 mg by mouth every morning.  11/02/10  Yes Vic Blackbird, MD  isosorbide mononitrate (IMDUR) 60 MG 24 hr tablet Take  1 tablet (60 mg total) by mouth daily. 01/25/11 01/25/12 Yes Radene Gunning, NP  levothyroxine (SYNTHROID, LEVOTHROID) 25 MCG tablet Take 1 tablet (25 mcg total) by mouth daily before breakfast. 01/25/11 01/25/12 Yes Lezlie Octave Black, NP  metolazone (ZAROXOLYN) 2.5 MG tablet Take 1 tablet (2.5 mg total) by mouth daily. 01/26/11 01/26/12 Yes Lezlie Octave Black, NP  potassium chloride (K-DUR) 10 MEQ tablet Take 1 tablet (10 mEq total) by mouth daily. Take with your evening dose of lasix 10/28/10 10/28/11 Yes Vic Blackbird, MD  TENEX 2 MG tablet TAKE ONE  TABLET BY MOUTH AT BEDTIME. 12/04/10  Yes Vic Blackbird, MD  ZOCOR 10 MG tablet TAKE (1) TABLET BY MOUTH IN THE EVENING. 01/02/11  Yes Vic Blackbird, MD    Allergies: No Known Allergies  Past Medical History: Past Medical History  Diagnosis Date  . Anxiety   . Diabetes mellitus, type 2   . Hypertension   . Stroke     residual right side weakness and pain  . IBS (irritable bowel syndrome)     with costipation  . Overactive bladder   . Cataracts, both eyes   . Gynecologic cancer   . Hyperlipidemia   . CKD (chronic kidney disease), stage V   . Anemia in CKD (chronic kidney disease)   . SVT (supraventricular tachycardia)   . CHF (congestive heart failure)   . Arthritis     gout  . SOB (shortness of breath)     nml spirometry 09/05/06    Past Surgical History  Procedure Date  . Vesicovaginal fistula closure w/ tah   . Av fistula placement, brachiocephalic [id number]    left arm   by Dr. Bridgett Larsson  . Tee without cardioversion 2011    2/2 Strep Bovis infection    Social History:  reports that she has never smoked. She has never used smokeless tobacco. She reports that she does not drink alcohol or use illicit drugs.  Family History: There is a history of, diabetes, and hypertension in the family.  Review of Systems - History obtained from the patient General ROS: negative Psychological ROS: negative Ophthalmic ROS: negative ENT ROS: negative Allergy and Immunology ROS: negative Hematological and Lymphatic ROS: negative Endocrine ROS: negative Respiratory ROS: negative Cardiovascular ROS: negative Gastrointestinal ROS: negative Genito-Urinary ROS: no dysuria, trouble voiding, or hematuria negative Musculoskeletal ROS: negative Neurological ROS: negative Dermatological ROS: negative  Physical Examination Blood pressure 145/57, pulse 50, temperature 97.8 F (36.6 C), temperature source Oral, resp. rate 18, height 5\' 5"  (1.651 m), weight 82.101 kg (181 lb), last  menstrual period 12/24/2010, SpO2 99.00%.  General appearance: alert, cooperative, appears stated age and no distress Head: Normocephalic, without obvious abnormality, atraumatic Eyes: negative Throat: lips, mucosa, and tongue normal; teeth and gums normal Neck: no adenopathy, no carotid bruit, no JVD, supple, symmetrical, trachea midline and thyroid not enlarged, symmetric, no tenderness/mass/nodules Back: negative, symmetric, no curvature. ROM normal. No CVA tenderness. Resp: clear to auscultation bilaterally Cardio:Bradycardic, S1, S2 normal, no murmur, click, rub or gallop GI: soft, non-tender; bowel sounds normal; no masses,  no organomegaly Extremities: extremities normal, atraumatic, no cyanosis or edema Pulses: 2+ and symmetric Skin: Skin color, texture, turgor normal. No rashes or lesions Lymph nodes: Cervical, supraclavicular, and axillary nodes normal. Neurologic: Grossly normal. No focal deficits.  Laboratory Data: Results for orders placed during the hospital encounter of 01/31/11 (from the past 48 hour(s))  GLUCOSE, CAPILLARY     Status: Abnormal   Collection Time  01/31/11  7:19 PM      Component Value Range Comment   Glucose-Capillary 260 (*) 70 - 99 (mg/dL)   BASIC METABOLIC PANEL     Status: Abnormal   Collection Time   01/31/11  8:34 PM      Component Value Range Comment   Sodium 125 (*) 135 - 145 (mEq/L)    Potassium 3.3 (*) 3.5 - 5.1 (mEq/L)    Chloride 82 (*) 96 - 112 (mEq/L)    CO2 27  19 - 32 (mEq/L)    Glucose, Bld 246 (*) 70 - 99 (mg/dL)    BUN 66 (*) 6 - 23 (mg/dL)    Creatinine, Ser 3.71 (*) 0.50 - 1.10 (mg/dL)    Calcium 9.0  8.4 - 10.5 (mg/dL)    GFR calc non Af Amer 10 (*) >90 (mL/min)    GFR calc Af Amer 12 (*) >90 (mL/min)   CBC     Status: Abnormal   Collection Time   01/31/11  8:34 PM      Component Value Range Comment   WBC 6.9  4.0 - 10.5 (K/uL)    RBC 4.45  3.87 - 5.11 (MIL/uL)    Hemoglobin 10.8 (*) 12.0 - 15.0 (g/dL)    HCT 32.4 (*)  36.0 - 46.0 (%)    MCV 72.8 (*) 78.0 - 100.0 (fL)    MCH 24.3 (*) 26.0 - 34.0 (pg)    MCHC 33.3  30.0 - 36.0 (g/dL)    RDW 18.1 (*) 11.5 - 15.5 (%)    Platelets 169  150 - 400 (K/uL)   DIFFERENTIAL     Status: Normal   Collection Time   01/31/11  8:34 PM      Component Value Range Comment   Neutrophils Relative 69  43 - 77 (%)    Neutro Abs 4.8  1.7 - 7.7 (K/uL)    Lymphocytes Relative 19  12 - 46 (%)    Lymphs Abs 1.3  0.7 - 4.0 (K/uL)    Monocytes Relative 11  3 - 12 (%)    Monocytes Absolute 0.8  0.1 - 1.0 (K/uL)    Eosinophils Relative 0  0 - 5 (%)    Eosinophils Absolute 0.0  0.0 - 0.7 (K/uL)    Basophils Relative 0  0 - 1 (%)    Basophils Absolute 0.0  0.0 - 0.1 (K/uL)   TROPONIN I     Status: Normal   Collection Time   01/31/11  8:34 PM      Component Value Range Comment   Troponin I <0.30  <0.30 (ng/mL)   URINALYSIS, ROUTINE W REFLEX MICROSCOPIC     Status: Abnormal   Collection Time   01/31/11  8:39 PM      Component Value Range Comment   Color, Urine YELLOW  YELLOW     APPearance CLEAR  CLEAR     Specific Gravity, Urine 1.010  1.005 - 1.030     pH 6.5  5.0 - 8.0     Glucose, UA NEGATIVE  NEGATIVE (mg/dL)    Hgb urine dipstick NEGATIVE  NEGATIVE     Bilirubin Urine NEGATIVE  NEGATIVE     Ketones, ur NEGATIVE  NEGATIVE (mg/dL)    Protein, ur 30 (*) NEGATIVE (mg/dL)    Urobilinogen, UA 0.2  0.0 - 1.0 (mg/dL)    Nitrite NEGATIVE  NEGATIVE     Leukocytes, UA NEGATIVE  NEGATIVE    URINE MICROSCOPIC-ADD ON  Status: Abnormal   Collection Time   01/31/11  8:39 PM      Component Value Range Comment   Squamous Epithelial / LPF FEW (*) RARE     WBC, UA 3-6  <3 (WBC/hpf)     Radiology Reports: Ct Head Wo Contrast  01/31/2011  *RADIOLOGY REPORT*  Clinical Data: Weakness.  CT HEAD WITHOUT CONTRAST  Technique:  Contiguous axial images were obtained from the base of the skull through the vertex without contrast.  Comparison: Head CT scan 01/11/2011.  Findings: Again seen is  cortical atrophy and chronic microvascular ischemic change.  No evidence of acute infarction, hemorrhage, mass lesion, mass effect, midline shift or abnormal extra-axial fluid collection.  Mucosal thickening left maxillary sinus is noted.  IMPRESSION: No acute finding.  Original Report Authenticated By: Arvid Right. Luther Parody, M.D.   Dg Chest Port 1 View  01/31/2011  *RADIOLOGY REPORT*  Clinical Data: Shortness of breath and cough.  PORTABLE CHEST - 1 VIEW  Comparison: Plain film chest 01/21/2011.  Findings: There is elevation of the left hemidiaphragm with some subsegmental atelectasis in the left base.  The right lung is clear.  There is cardiomegaly.  IMPRESSION: Cardiomegaly and subsegmental atelectasis left lung base.  Original Report Authenticated By: Arvid Right. Luther Parody, M.D.    Electrocardiogram: EKG shows a sinus bradycardia at 49 beats per minute. There is evidence for first degree AV block. There is some T. inversion in lateral leads, as well as in anterior leads. No concerning ST changes are noted. EKG was compared to one from earlier this month.  Assessment/Plan  Principal Problem:  *Syncope Active Problems:  HYPERLIPIDEMIA  ANEMIA OF RENAL FAILURE  HYPERTENSION  Chronic kidney disease, stage 4, severely decreased GFR  Constipation  Chronic diastolic congestive heart failure   #1 syncope: History is suggestive of orthostatic hypotension. Orthostatics were checked in the ED, however, BP/HR change was only checked from a lying to sitting position. She was not stood up. She's also bradycardic, which may have accounted for the syncope as well. So, we will observe her overnight. Will have her seen by her cardiologist in the morning. Because of her significant bradycardia and will hold her Coreg. We may have to decrease the dose of this medication but would defer this to her cardiologist. She did have an echocardiogram recently and results are available in the electronic medical records. It  did show grade 2 diastolic dysfunction. Cardiac enzymes will be checked. Her history is not suggestive of VTE  #2. Left-sided abdominal discomfort: This is most likely secondary to constipation. Will check a KUB. We'll give her stool softeners and laxatives.  #3 chronic kidney disease stage IV: Continue with the Lasix. She will need to followup with her nephrologist as an outpatient. Her creatinine is somewhat higher than her baseline. However, I think we need to tolerate a higher creatinine in her at this time. She is making adequate urine. She apparently has AV shunt on the left upper extremity.  #4 anemia of chronic disease, stable.  #5 chronic diastolic congestive heart failure is well compensated.  #6 hypertension. Stable. Continue with current antihypertensive agents.  Physical therapy and occupational therapy will be asked to evaluate this patient.  DVT, prophylaxis will be initiated.  Further management decisions will depend on results of further testing and patient's response to treatment.  [facility] Pager [phone]  01/31/2011, 10:35 PM

## 2011-01-31 NOTE — ED Provider Notes (Signed)
History  This chart was scribed for Paula Relic, MD by Jenne Campus. This patient was seen in room APA14/APA14 and the patient's care was started at 8:06PM.  CSN: [id number]  Arrival date & time 01/31/11  1903   First MD Initiated Contact with Patient 01/31/11 2002      Chief Complaint  Patient presents with  . Weakness  . Hyperglycemia    The history is provided by the patient and a relative. No language interpreter was used.    Paula FREEMONT is a 78 y.o. female brought in by ambulance, who presents to the Emergency Department complaining of syncope. She states that she got up to close her front door and woke up on the floor. She is unsure how long she was out. The syncope was not witnessed by anyone. She was alert and oriented and able to speak in complete sentences when EMS came. Pt also c/o hyperglycemia and generalized weakness. She lives alone in an apartment. At baseline, she has constant generalized weakness but states that she can can walk around with no SOB. She has a walker for safety. She denies any changes in speech or vision as associated symptoms. She reports previous syncope spells, the last being 2 weeks ago. She has a h/o diabetes, HTN, IBS, CHF and stroke. She denies smoking and alcohol use.   Pt's cardiologist is Dr. Lattie Haw and Pt's PCP is Dr. Buelah Manis.  Past Medical History  Diagnosis Date  . Anxiety   . Diabetes mellitus, type 2   . Hypertension   . Stroke     residual right side weakness and pain  . IBS (irritable bowel syndrome)     with costipation  . Overactive bladder   . Cataracts, both eyes   . Gynecologic cancer   . Hyperlipidemia   . CKD (chronic kidney disease), stage V   . Anemia in CKD (chronic kidney disease)   . SVT (supraventricular tachycardia)   . CHF (congestive heart failure)   . Arthritis     gout  . SOB (shortness of breath)     nml spirometry 09/05/06    Past Surgical History  Procedure Date  . Vesicovaginal fistula  closure w/ tah   . Av fistula placement, brachiocephalic [id number]    left arm   by Dr. Bridgett Larsson  . Tee without cardioversion 2011    2/2 Strep Bovis infection    History reviewed. No pertinent family history.  History  Substance Use Topics  . Smoking status: Never Smoker   . Smokeless tobacco: Never Used  . Alcohol Use: No    OB History    Grav Para Term Preterm Abortions TAB SAB Ect Mult Living                  Review of Systems  Constitutional: Negative for fever.       10 Systems reviewed and are negative for acute change except as noted in the HPI.  HENT: Negative for rhinorrhea.   Eyes: Negative for discharge and redness.  Respiratory: Negative for cough and shortness of breath.   Cardiovascular: Negative for chest pain.  Gastrointestinal: Negative for vomiting, abdominal pain and diarrhea.  Genitourinary: Negative for dysuria.  Musculoskeletal: Negative for back pain.  Skin: Negative for rash.  Neurological: Positive for syncope and weakness. Negative for dizziness, speech difficulty, numbness and headaches.  Psychiatric/Behavioral: Negative for suicidal ideas, hallucinations and confusion.    Allergies  Review of patient's allergies indicates no known allergies.  Home Medications   No current outpatient prescriptions on file.  Triage Vitals: BP 125/68  Pulse 54  Temp(Src) 97.8 F (36.6 C) (Oral)  Resp 18  Ht 5\' 5"  (1.651 m)  Wt 181 lb (82.101 kg)  BMI 30.12 kg/m2  SpO2 100%  LMP 12/24/2010  Physical Exam  Nursing note and vitals reviewed. Constitutional: She is oriented to person, place, and time. She appears well-developed and well-nourished.       Awake, alert, nontoxic appearance with baseline speech for patient.  HENT:  Head: Normocephalic and atraumatic.  Mouth/Throat: No oropharyngeal exudate.  Eyes: EOM are normal. Pupils are equal, round, and reactive to light. Right eye exhibits no discharge. Left eye exhibits no discharge.  Neck: Neck  supple.  Cardiovascular: Normal rate and regular rhythm.   Murmur (Soft) heard. Pulmonary/Chest: Effort normal and breath sounds normal. No stridor. No respiratory distress. She has no wheezes. She has no rales. She exhibits no tenderness.  Abdominal: Soft. Bowel sounds are normal. She exhibits no mass. There is no tenderness. There is no rebound.  Musculoskeletal: She exhibits no tenderness.       Baseline ROM, moves extremities with no obvious new focal weakness.  Lymphadenopathy:    She has no cervical adenopathy.  Neurological: She is alert and oriented to person, place, and time. No cranial nerve deficit.       Awake, alert, cooperative and aware of situation; motor strength bilaterally; sensation normal to light touch bilaterally; peripheral visual fields full to confrontation; no facial asymmetry; tongue midline; major cranial nerves appear intact; no pronator drift, normal finger to nose bilaterally, Mild weakness to the right arm and right leg baseline for Pt  Skin: Skin is warm and dry. No rash noted.  Psychiatric: She has a normal mood and affect. Her behavior is normal.    ED Course  Procedures (including critical care time) ECG: Sinus bradycardia with first degree AV block, ventricular rate 49, normal axis, left ventricular hypertrophy with repolarization abnormality, abnormal T waves inferolateral leads, no significant change compared with 01/21/2011 DIAGNOSTIC STUDIES: Oxygen Saturation is 100% on [place], normal by my interpretation.    COORDINATION OF CARE: 8:14PM-Discussed CT scan, head x-ray and blood panel with pt and pt agreed. Discussed possible admission for over-night observation and pt agreed. 9:56PM-Discussed lab and radiology results and pt acknowledged results. Advised pt that she will be admitted for overnight observation and pt agreed.    Labs Reviewed  GLUCOSE, CAPILLARY - Abnormal; Notable for the following:    Glucose-Capillary 260 (*)    All other components  within normal limits  URINALYSIS, ROUTINE W REFLEX MICROSCOPIC - Abnormal; Notable for the following:    Protein, ur 30 (*)    All other components within normal limits  BASIC METABOLIC PANEL - Abnormal; Notable for the following:    Sodium 125 (*)    Potassium 3.3 (*)    Chloride 82 (*)    Glucose, Bld 246 (*)    BUN 66 (*)    Creatinine, Ser 3.71 (*)    GFR calc non Af Amer 10 (*)    GFR calc Af Amer 12 (*)    All other components within normal limits  CBC - Abnormal; Notable for the following:    Hemoglobin 10.8 (*)    HCT 32.4 (*)    MCV 72.8 (*)    MCH 24.3 (*)    RDW 18.1 (*)    All other components within normal limits  URINE MICROSCOPIC-ADD ON -  Abnormal; Notable for the following:    Squamous Epithelial / LPF FEW (*)    All other components within normal limits  DIFFERENTIAL  TROPONIN I  POCT CBG MONITORING  HEMOGLOBIN A1C  POCT CBG MONITORING  CARDIAC PANEL(CRET KIN+CKTOT+MB+TROPI)  CARDIAC PANEL(CRET KIN+CKTOT+MB+TROPI)  COMPREHENSIVE METABOLIC PANEL  CBC  CARDIAC PANEL(CRET KIN+CKTOT+MB+TROPI)   Dg Abd 1 View  01/31/2011  *RADIOLOGY REPORT*  Clinical Data: Left-sided abdominal pain; constipation.  ABDOMEN - 1 VIEW  Comparison: Abdominal radiograph and CT of the abdomen and pelvis performed 01/15/2011  Findings: The visualized bowel gas pattern is unremarkable.  Stool is noted filling the cecum and ascending colon, and also within the sigmoid colon; no abnormal dilatation of small bowel loops is seen to suggest small bowel obstruction.  No free intra-abdominal air is identified, though evaluation for free air is limited on a single supine view.  The visualized osseous structures are within normal limits; the sacroiliac joints are unremarkable in appearance.  Diffuse vascular calcifications are noted.  IMPRESSION:  1.  Stool noted filling the cecum and ascending colon, without evidence of significant constipation.  No free intra-abdominal air seen. 2.  Diffuse vascular  calcifications noted.  Original Report Authenticated By: Santa Lighter, M.D.   Ct Head Wo Contrast  01/31/2011  *RADIOLOGY REPORT*  Clinical Data: Weakness.  CT HEAD WITHOUT CONTRAST  Technique:  Contiguous axial images were obtained from the base of the skull through the vertex without contrast.  Comparison: Head CT scan 01/11/2011.  Findings: Again seen is cortical atrophy and chronic microvascular ischemic change.  No evidence of acute infarction, hemorrhage, mass lesion, mass effect, midline shift or abnormal extra-axial fluid collection.  Mucosal thickening left maxillary sinus is noted.  IMPRESSION: No acute finding.  Original Report Authenticated By: Arvid Right. Luther Parody, M.D.   Dg Chest Port 1 View  01/31/2011  *RADIOLOGY REPORT*  Clinical Data: Shortness of breath and cough.  PORTABLE CHEST - 1 VIEW  Comparison: Plain film chest 01/21/2011.  Findings: There is elevation of the left hemidiaphragm with some subsegmental atelectasis in the left base.  The right lung is clear.  There is cardiomegaly.  IMPRESSION: Cardiomegaly and subsegmental atelectasis left lung base.  Original Report Authenticated By: Arvid Right. D'ALESSIO, M.D.     1. Syncope   2. Renal insufficiency   3. Anemia       MDM  The patient appears reasonably stabilized for admission considering the current resources, flow, and capabilities available in the ED at this time, and I doubt any other [facility] requiring further screening and/or treatment in the ED prior to admission.   I personally performed the services described in this documentation, which was scribed in my presence. The recorded information has been reviewed and considered.      Paula Relic, MD 02/01/11 [phone]

## 2011-01-31 NOTE — ED Notes (Signed)
Patient presents via EMS with c/o hyperglycemia and generalized weakness that started today.  Patient A&O; skin w/d.  Respirations even and unlabored; able to speak in complete sentences without difficulty.  Patient also states her bowels have not moved in 2 days.

## 2011-02-01 DIAGNOSIS — R42 Dizziness and giddiness: Secondary | ICD-10-CM | POA: Diagnosis present

## 2011-02-01 DIAGNOSIS — R001 Bradycardia, unspecified: Secondary | ICD-10-CM | POA: Diagnosis present

## 2011-02-01 DIAGNOSIS — N390 Urinary tract infection, site not specified: Secondary | ICD-10-CM | POA: Diagnosis present

## 2011-02-01 LAB — COMPREHENSIVE METABOLIC PANEL
Albumin: 3 g/dL — ABNORMAL LOW (ref 3.5–5.2)
BUN: 67 mg/dL — ABNORMAL HIGH (ref 6–23)
Calcium: 8.9 mg/dL (ref 8.4–10.5)
Chloride: 85 mEq/L — ABNORMAL LOW (ref 96–112)
Creatinine, Ser: 3.75 mg/dL — ABNORMAL HIGH (ref 0.50–1.10)
GFR calc non Af Amer: 10 mL/min — ABNORMAL LOW (ref >90)
Total Bilirubin: 0.4 mg/dL (ref 0.3–1.2)

## 2011-02-01 LAB — CBC
Hemoglobin: 10.3 g/dL — ABNORMAL LOW (ref 12.0–15.0)
Platelets: 175 10*3/uL (ref 150–400)
RBC: 4.2 MIL/uL (ref 3.87–5.11)
WBC: 7.3 10*3/uL (ref 4.0–10.5)

## 2011-02-01 LAB — CARDIAC PANEL(CRET KIN+CKTOT+MB+TROPI)
Relative Index: INVALID (ref 0.0–2.5)
Relative Index: INVALID (ref 0.0–2.5)
Relative Index: INVALID (ref 0.0–2.5)
Total CK: 86 U/L (ref 7–177)
Total CK: 72 U/L (ref 7–177)
Troponin I: <0.3 ng/mL (ref <0.30)
Troponin I: <0.3 ng/mL (ref <0.30)
Troponin I: <0.3 ng/mL (ref <0.30)

## 2011-02-01 LAB — GLUCOSE, CAPILLARY: Glucose-Capillary: 118 mg/dL — ABNORMAL HIGH (ref 70–99)

## 2011-02-01 LAB — HEMOGLOBIN A1C: Mean Plasma Glucose: 143 mg/dL — ABNORMAL HIGH (ref <117)

## 2011-02-01 MED ORDER — TUBERCULIN PPD 5 UNIT/0.1ML ID SOLN
5.0000 [IU] | Freq: Once | INTRADERMAL | Status: AC
  Administered 2011-02-01: 5 [IU] via INTRADERMAL
  Filled 2011-02-01: qty 0.1

## 2011-02-01 MED ORDER — POTASSIUM CHLORIDE IN NACL 20-0.9 MEQ/L-% IV SOLN
INTRAVENOUS | Status: DC
  Administered 2011-02-01 – 2011-02-02 (×2): via INTRAVENOUS

## 2011-02-01 MED ORDER — POTASSIUM CHLORIDE CRYS ER 20 MEQ PO TBCR
30.0000 meq | EXTENDED_RELEASE_TABLET | Freq: Two times a day (BID) | ORAL | Status: AC
  Administered 2011-02-01 (×2): 30 meq via ORAL
  Filled 2011-02-01 (×2): qty 1

## 2011-02-01 MED ORDER — CLONIDINE HCL 0.1 MG PO TABS
0.1000 mg | ORAL_TABLET | Freq: Three times a day (TID) | ORAL | Status: DC
  Administered 2011-02-01 – 2011-02-05 (×11): 0.1 mg via ORAL
  Filled 2011-02-01 (×11): qty 1

## 2011-02-01 NOTE — Progress Notes (Addendum)
Spoke with Dr. Caryn Section about the patient being on 70/30 insulin with a range order.  MD stated that she had not seen the patient yet and will address after she sees her.

## 2011-02-01 NOTE — Progress Notes (Signed)
Subjective: The patient is currently lying in bed. She has no complaints of headache, dizziness, or chest pain at rest.  Objective: Vital signs in last 24 hours: Filed Vitals:   02/01/11 0837 02/01/11 0839 02/01/11 0842 02/01/11 1149  BP: 127/56 106/67 142/61   Pulse: 47 49 52 46  Temp:      TempSrc:      Resp:    16  Height:      Weight:      SpO2:    97%    Intake/Output Summary (Last 24 hours) at 02/01/11 1206 Last data filed at 01/31/11 2330  Gross per 24 hour  Intake      0 ml  Output    150 ml  Net   -150 ml    Weight change:   Physical exam: General: Elderly after American woman who is currently lying in bed, alert, and in no acute distress. Lungs: Decreased breath sounds in the bases, otherwise clear. Heart: S1, S2, with a systolic murmur and bradycardia. Abdomen: Positive bowel sounds, soft, nontender, nondistended. Extremities: No pedal edema. Neurologic: She is alert and oriented x3. Cranial nerves II through XII are intact. Her speech is clear.  Lab Results: Basic Metabolic Panel:  Basename 02/01/11 0154 01/31/11 2034  NA 127* 125*  K 2.8* 3.3*  CL 85* 82*  CO2 34* 27  GLUCOSE 63* 246*  BUN 67* 66*  CREATININE 3.75* 3.71*  CALCIUM 8.9 9.0  MG -- --  PHOS -- --   Liver Function Tests:  Basename 02/01/11 0154  AST 12  ALT 7  ALKPHOS 75  BILITOT 0.4  PROT 6.3  ALBUMIN 3.0*   No results found for this basename: LIPASE:2,AMYLASE:2 in the last 72 hours No results found for this basename: AMMONIA:2 in the last 72 hours CBC:  Basename 02/01/11 0154 01/31/11 2034  WBC 7.3 6.9  NEUTROABS -- 4.8  HGB 10.3* 10.8*  HCT 30.5* 32.4*  MCV 72.6* 72.8*  PLT 175 169   Cardiac Enzymes:  Basename 02/01/11 0954 02/01/11 0154 01/31/11 2034  CKTOTAL 86 87 --  CKMB 2.2 2.1 --  CKMBINDEX -- -- --  TROPONINI <0.30 <0.30 <0.30   BNP: No results found for this basename: PROBNP:3 in the last 72 hours D-Dimer: No results found for this basename:  DDIMER:2 in the last 72 hours CBG:  Basename 02/01/11 0839 01/31/11 1919  GLUCAP 118* 260*   Hemoglobin A1C: No results found for this basename: HGBA1C in the last 72 hours Fasting Lipid Panel: No results found for this basename: CHOL,HDL,LDLCALC,TRIG,CHOLHDL,LDLDIRECT in the last 72 hours Thyroid Function Tests: No results found for this basename: TSH,T4TOTAL,FREET4,T3FREE,THYROIDAB in the last 72 hours Anemia Panel: No results found for this basename: VITAMINB12,FOLATE,FERRITIN,TIBC,IRON,RETICCTPCT in the last 72 hours Coagulation: No results found for this basename: LABPROT:2,INR:2 in the last 72 hours Urine Drug Screen: Drugs of Abuse  No results found for this basename: labopia,  cocainscrnur,  labbenz,  amphetmu,  thcu,  labbarb    Alcohol Level: No results found for this basename: ETH:2 in the last 72 hours Urinalysis:  Misc. Labs:   Micro: Recent Results (from the past 240 hour(s))  URINE CULTURE     Status: Normal   Collection Time   01/23/11 10:43 AM      Component Value Range Status Comment   Specimen Description URINE, CATHETERIZED   Final    Special Requests NONE   Final    Setup Time [id number]   Final    Colony  Count >=100,000 COLONIES/ML   Final    Culture PSEUDOMONAS AERUGINOSA   Final    Report Status 01/25/2011 FINAL   Final    Organism ID, Bacteria PSEUDOMONAS AERUGINOSA   Final     Studies/Results: Dg Abd 1 View  01/31/2011  *RADIOLOGY REPORT*  Clinical Data: Left-sided abdominal pain; constipation.  ABDOMEN - 1 VIEW  Comparison: Abdominal radiograph and CT of the abdomen and pelvis performed 01/15/2011  Findings: The visualized bowel gas pattern is unremarkable.  Stool is noted filling the cecum and ascending colon, and also within the sigmoid colon; no abnormal dilatation of small bowel loops is seen to suggest small bowel obstruction.  No free intra-abdominal air is identified, though evaluation for free air is limited on a single supine view.  The  visualized osseous structures are within normal limits; the sacroiliac joints are unremarkable in appearance.  Diffuse vascular calcifications are noted.  IMPRESSION:  1.  Stool noted filling the cecum and ascending colon, without evidence of significant constipation.  No free intra-abdominal air seen. 2.  Diffuse vascular calcifications noted.  Original Report Authenticated By: Santa Lighter, M.D.   Ct Head Wo Contrast  01/31/2011  *RADIOLOGY REPORT*  Clinical Data: Weakness.  CT HEAD WITHOUT CONTRAST  Technique:  Contiguous axial images were obtained from the base of the skull through the vertex without contrast.  Comparison: Head CT scan 01/11/2011.  Findings: Again seen is cortical atrophy and chronic microvascular ischemic change.  No evidence of acute infarction, hemorrhage, mass lesion, mass effect, midline shift or abnormal extra-axial fluid collection.  Mucosal thickening left maxillary sinus is noted.  IMPRESSION: No acute finding.  Original Report Authenticated By: Arvid Right. Luther Parody, M.D.   Dg Chest Port 1 View  01/31/2011  *RADIOLOGY REPORT*  Clinical Data: Shortness of breath and cough.  PORTABLE CHEST - 1 VIEW  Comparison: Plain film chest 01/21/2011.  Findings: There is elevation of the left hemidiaphragm with some subsegmental atelectasis in the left base.  The right lung is clear.  There is cardiomegaly.  IMPRESSION: Cardiomegaly and subsegmental atelectasis left lung base.  Original Report Authenticated By: Arvid Right. Luther Parody, M.D.    Medications: I have reviewed the patient's current medications.  Assessment: Principal Problem:  *Syncope Active Problems:  DIABETES MELLITUS, CONTROLLED, WITH COMPLICATIONS  HYPERLIPIDEMIA  ANEMIA OF RENAL FAILURE  HYPERTENSION  Chronic kidney disease, stage 4, severely decreased GFR  Constipation  Chronic diastolic congestive heart failure  Dizziness  Hyponatremia  Bradycardia  UTI (lower urinary tract infection)  Hypokalemia  1.  Syncope/presyncope, associated dizziness with fall. The etiology is likely multifactorial including bradycardia, hyponatremia, hypokalemia, and acute on chronic renal failure. Zaroxolyn and clonidine were added to her medication regimen during the previous hospitalization last week as well. The CT scan of her head is nonacute. Her cranial nerves are intact.  Hyponatremia and hypokalemia. Last week, her serum sodium was 137. On admission, it was 125. Her potassium last week was 4.2. On admission, it was 3.3 and now it is 2.8. Given her recent diuretic treatment for diastolic congestive heart failure last week once again, it is likely that she has prerenal azotemia and hypovolemic hyponatremia.  Acute renal failure superimposed on stage IV chronic kidney disease. She has an AV fistula placed but has not started dialysis yet. She was treated with high-dose Lasix during the previous hospitalization and discharged to home on Lasix and Zaroxolyn. Her creatinine last week was 2.81 and today it is 3.75. With her frequent hospitalizations, I  wonder if it would be beneficial to go ahead and start dialysis. However, I will consult nephrology and deferred the decision to him.  Chronic diastolic congestive heart failure. This has been a source of multiple hospitalizations in the recent past. Currently, it appears to be compensated.  Hypertension. She is treated with amlodipine, clonidine, hydralazine, guanfacine, and Coreg. Coreg is currently on hold because of bradycardia. Her blood pressure is reasonable.  Sinus bradycardia. Her TSH was within normal limits last week. She is treated chronically with carvedilol which could be contributing. Catapres was added last week which also may be contributing. With her worsening renal function, she may not be clearing these medications as efficiently. Coreg has been held.  Type 2 diabetes mellitus. Her CBGs are stable and controlled.  Anemia, thought to be secondary to  chronic kidney disease. She was transfused one unit of packed red blood cells last week.  Recent treatment for urinary tract infection. We'll check a urine culture for followup.  Multiple recent hospitalizations. The patient may not be thriving at home alone, particularly in the setting of multiple medication changes and worsening renal failure. This is her fourth hospitalization in 4 weeks. Historically, she has had difficulty with complying with the medication regimen and followup. I have discussed this with her. I informed her that I thought it was time for her to be in a skilled environment or in an assisted living environment but I could not force her to go. This will need to be discussed further with her family.    Plan:  1. We'll start gentle IV fluids with normal saline with potassium chloride added. 2. We'll start potassium chloride supplementation. Will monitor her renal function and serum potassium daily. 3. Will continue to hold Coreg and will order hold parameters for clonidine for a heart rate of less than 55 beats per minute. We'll decrease the dose to 0.1 mg 3 times a day given that she is on 2 alpha blockers. Further recommendations per cardiology. 4. Will hold Lasix and Zaroxolyn for now. Consider restarting these after volume repletion accordingly, but will allow for recommendations by nephrology. 5. Will order a followup urine culture. 6. We'll consult [facility] cardiology and nephrology for further management recommendations. 7. PT consultation. Social Engineer, maintenance (IT). (I strongly believe that the patient would benefit from either skilled nursing facility or assisted living facility placement because of safety issues and for improvement in medical management.)   LOS: 1 day   Pancho Rushing 02/01/2011, 12:06 PM

## 2011-02-01 NOTE — Consult Note (Signed)
Reason for Consult: Worsening of renal failure Referring Physician: Dr. Betsy Coder is an 78 y.o. female.  HPI: She is a patient with well-known to me who has chronic renal failure stage IV presently came because of weakness and history of fall. Patient says that her appetite has gone down and she has lost some weight. She denies nausea vomiting and she denies also any difficulty breathing. Consult is called because of worsening (failure.  Past Medical History  Diagnosis Date  . Anxiety   . Diabetes mellitus, type 2   . Hypertension   . Stroke     residual right side weakness and pain  . IBS (irritable bowel syndrome)     with costipation  . Overactive bladder   . Cataracts, both eyes   . Gynecologic cancer   . Hyperlipidemia   . CKD (chronic kidney disease), stage V   . Anemia in CKD (chronic kidney disease)   . SVT (supraventricular tachycardia)   . CHF (congestive heart failure)   . Arthritis     gout  . SOB (shortness of breath)     nml spirometry 09/05/06    Past Surgical History  Procedure Date  . Vesicovaginal fistula closure w/ tah   . Av fistula placement, brachiocephalic [id number]    left arm   by Dr. Bridgett Larsson  . Tee without cardioversion 2011    2/2 Strep Bovis infection    History reviewed. No pertinent family history.  Social History:  reports that she has never smoked. She has never used smokeless tobacco. She reports that she does not drink alcohol or use illicit drugs.  Allergies: No Known Allergies  Medications: I have reviewed the patient's current medications.  Results for orders placed during the hospital encounter of 01/31/11 (from the past 48 hour(s))  GLUCOSE, CAPILLARY     Status: Abnormal   Collection Time   01/31/11  7:19 PM      Component Value Range Comment   Glucose-Capillary 260 (*) 70 - 99 (mg/dL)   BASIC METABOLIC PANEL     Status: Abnormal   Collection Time   01/31/11  8:34 PM      Component Value Range Comment   Sodium  125 (*) 135 - 145 (mEq/L)    Potassium 3.3 (*) 3.5 - 5.1 (mEq/L)    Chloride 82 (*) 96 - 112 (mEq/L)    CO2 27  19 - 32 (mEq/L)    Glucose, Bld 246 (*) 70 - 99 (mg/dL)    BUN 66 (*) 6 - 23 (mg/dL)    Creatinine, Ser 3.71 (*) 0.50 - 1.10 (mg/dL)    Calcium 9.0  8.4 - 10.5 (mg/dL)    GFR calc non Af Amer 10 (*) >90 (mL/min)    GFR calc Af Amer 12 (*) >90 (mL/min)   CBC     Status: Abnormal   Collection Time   01/31/11  8:34 PM      Component Value Range Comment   WBC 6.9  4.0 - 10.5 (K/uL)    RBC 4.45  3.87 - 5.11 (MIL/uL)    Hemoglobin 10.8 (*) 12.0 - 15.0 (g/dL)    HCT 32.4 (*) 36.0 - 46.0 (%)    MCV 72.8 (*) 78.0 - 100.0 (fL)    MCH 24.3 (*) 26.0 - 34.0 (pg)    MCHC 33.3  30.0 - 36.0 (g/dL)    RDW 18.1 (*) 11.5 - 15.5 (%)    Platelets 169  150 - 400 (K/uL)  DIFFERENTIAL     Status: Normal   Collection Time   01/31/11  8:34 PM      Component Value Range Comment   Neutrophils Relative 69  43 - 77 (%)    Neutro Abs 4.8  1.7 - 7.7 (K/uL)    Lymphocytes Relative 19  12 - 46 (%)    Lymphs Abs 1.3  0.7 - 4.0 (K/uL)    Monocytes Relative 11  3 - 12 (%)    Monocytes Absolute 0.8  0.1 - 1.0 (K/uL)    Eosinophils Relative 0  0 - 5 (%)    Eosinophils Absolute 0.0  0.0 - 0.7 (K/uL)    Basophils Relative 0  0 - 1 (%)    Basophils Absolute 0.0  0.0 - 0.1 (K/uL)   TROPONIN I     Status: Normal   Collection Time   01/31/11  8:34 PM      Component Value Range Comment   Troponin I <0.30  <0.30 (ng/mL)   URINALYSIS, ROUTINE W REFLEX MICROSCOPIC     Status: Abnormal   Collection Time   01/31/11  8:39 PM      Component Value Range Comment   Color, Urine YELLOW  YELLOW     APPearance CLEAR  CLEAR     Specific Gravity, Urine 1.010  1.005 - 1.030     pH 6.5  5.0 - 8.0     Glucose, UA NEGATIVE  NEGATIVE (mg/dL)    Hgb urine dipstick NEGATIVE  NEGATIVE     Bilirubin Urine NEGATIVE  NEGATIVE     Ketones, ur NEGATIVE  NEGATIVE (mg/dL)    Protein, ur 30 (*) NEGATIVE (mg/dL)    Urobilinogen, UA  0.2  0.0 - 1.0 (mg/dL)    Nitrite NEGATIVE  NEGATIVE     Leukocytes, UA NEGATIVE  NEGATIVE    URINE MICROSCOPIC-ADD ON     Status: Abnormal   Collection Time   01/31/11  8:39 PM      Component Value Range Comment   Squamous Epithelial / LPF FEW (*) RARE     WBC, UA 3-6  <3 (WBC/hpf)   CARDIAC PANEL(CRET KIN+CKTOT+MB+TROPI)     Status: Normal   Collection Time   02/01/11  1:54 AM      Component Value Range Comment   Total CK 87  7 - 177 (U/L)    CK, MB 2.1  0.3 - 4.0 (ng/mL)    Troponin I <0.30  <0.30 (ng/mL)    Relative Index RELATIVE INDEX IS INVALID  0.0 - 2.5    COMPREHENSIVE METABOLIC PANEL     Status: Abnormal   Collection Time   02/01/11  1:54 AM      Component Value Range Comment   Sodium 127 (*) 135 - 145 (mEq/L)    Potassium 2.8 (*) 3.5 - 5.1 (mEq/L)    Chloride 85 (*) 96 - 112 (mEq/L)    CO2 34 (*) 19 - 32 (mEq/L)    Glucose, Bld 63 (*) 70 - 99 (mg/dL)    BUN 67 (*) 6 - 23 (mg/dL)    Creatinine, Ser 3.75 (*) 0.50 - 1.10 (mg/dL)    Calcium 8.9  8.4 - 10.5 (mg/dL)    Total Protein 6.3  6.0 - 8.3 (g/dL)    Albumin 3.0 (*) 3.5 - 5.2 (g/dL)    AST 12  0 - 37 (U/L)    ALT 7  0 - 35 (U/L)    Alkaline Phosphatase 75  39 -  117 (U/L)    Total Bilirubin 0.4  0.3 - 1.2 (mg/dL)    GFR calc non Af Amer 10 (*) >90 (mL/min)    GFR calc Af Amer 12 (*) >90 (mL/min)   CBC     Status: Abnormal   Collection Time   02/01/11  1:54 AM      Component Value Range Comment   WBC 7.3  4.0 - 10.5 (K/uL)    RBC 4.20  3.87 - 5.11 (MIL/uL)    Hemoglobin 10.3 (*) 12.0 - 15.0 (g/dL)    HCT 30.5 (*) 36.0 - 46.0 (%)    MCV 72.6 (*) 78.0 - 100.0 (fL)    MCH 24.5 (*) 26.0 - 34.0 (pg)    MCHC 33.8  30.0 - 36.0 (g/dL)    RDW 18.0 (*) 11.5 - 15.5 (%)    Platelets 175  150 - 400 (K/uL)   GLUCOSE, CAPILLARY     Status: Abnormal   Collection Time   02/01/11  8:39 AM      Component Value Range Comment   Glucose-Capillary 118 (*) 70 - 99 (mg/dL)   CARDIAC PANEL(CRET KIN+CKTOT+MB+TROPI)     Status:  Normal   Collection Time   02/01/11  9:54 AM      Component Value Range Comment   Total CK 86  7 - 177 (U/L)    CK, MB 2.2  0.3 - 4.0 (ng/mL)    Troponin I <0.30  <0.30 (ng/mL)    Relative Index RELATIVE INDEX IS INVALID  0.0 - 2.5    GLUCOSE, CAPILLARY     Status: Abnormal   Collection Time   02/01/11 12:09 PM      Component Value Range Comment   Glucose-Capillary 186 (*) 70 - 99 (mg/dL)    Comment 1 Notify RN       Dg Abd 1 View  01/31/2011  *RADIOLOGY REPORT*  Clinical Data: Left-sided abdominal pain; constipation.  ABDOMEN - 1 VIEW  Comparison: Abdominal radiograph and CT of the abdomen and pelvis performed 01/15/2011  Findings: The visualized bowel gas pattern is unremarkable.  Stool is noted filling the cecum and ascending colon, and also within the sigmoid colon; no abnormal dilatation of small bowel loops is seen to suggest small bowel obstruction.  No free intra-abdominal air is identified, though evaluation for free air is limited on a single supine view.  The visualized osseous structures are within normal limits; the sacroiliac joints are unremarkable in appearance.  Diffuse vascular calcifications are noted.  IMPRESSION:  1.  Stool noted filling the cecum and ascending colon, without evidence of significant constipation.  No free intra-abdominal air seen. 2.  Diffuse vascular calcifications noted.  Original Report Authenticated By: Santa Lighter, M.D.   Ct Head Wo Contrast  01/31/2011  *RADIOLOGY REPORT*  Clinical Data: Weakness.  CT HEAD WITHOUT CONTRAST  Technique:  Contiguous axial images were obtained from the base of the skull through the vertex without contrast.  Comparison: Head CT scan 01/11/2011.  Findings: Again seen is cortical atrophy and chronic microvascular ischemic change.  No evidence of acute infarction, hemorrhage, mass lesion, mass effect, midline shift or abnormal extra-axial fluid collection.  Mucosal thickening left maxillary sinus is noted.  IMPRESSION: No acute  finding.  Original Report Authenticated By: Arvid Right. Luther Parody, M.D.   Dg Chest Port 1 View  01/31/2011  *RADIOLOGY REPORT*  Clinical Data: Shortness of breath and cough.  PORTABLE CHEST - 1 VIEW  Comparison: Plain film chest 01/21/2011.  Findings:  There is elevation of the left hemidiaphragm with some subsegmental atelectasis in the left base.  The right lung is clear.  There is cardiomegaly.  IMPRESSION: Cardiomegaly and subsegmental atelectasis left lung base.  Original Report Authenticated By: Arvid Right. Luther Parody, M.D.    Review of Systems  Constitutional: Positive for weight loss.  Cardiovascular: Negative for chest pain, orthopnea, leg swelling and PND.  Gastrointestinal: Negative for nausea and vomiting.       She has poor appetite the last couple of weeks.  Musculoskeletal: Positive for falls.  Neurological: Positive for weakness.   Blood pressure 142/61, pulse 46, temperature 98.1 F (36.7 C), temperature source Oral, resp. rate 16, height 5\' 5"  (1.651 m), weight 80.74 kg (178 lb), last menstrual period 12/24/2010, SpO2 97.00%. Physical Exam  Constitutional: She is oriented to person, place, and time. No distress.  Eyes: Scleral icterus is present.  Neck: No JVD present.  Cardiovascular: Normal rate, regular rhythm, normal heart sounds and intact distal pulses.   Respiratory: She has no wheezes. She has no rales.  Neurological: She is alert and oriented to person, place, and time.    Assessment/Plan: Problem #1 renal failure chronic patient with stage IV. Presently her BUN and creatinine seems to be increasing and her EGFR has gone down to 9 cc per minute hence she is reaching end-stage. Patient also has some poor appetite and weight loss. Problem #2 history of diabetes Problem #3 history of anemia anemia of chronic disease presently patient is getting Epogen as an outpatient. Problem #4 history of hyponatremia Problem #5 history of her systolic dysfunction with CHF presently  she doesn't have any sign of fluid overload. Problem #6 history of bradycardia Problem #7 history of previous CVA Problem #8 history of hypo kalemia. Plan: We'll check her hepatitis B surface antigen, core antibody, hepatitis B surface antibody.          We'll put PPD           I did wheeze her hydration and replacement of her potassium patient has this moment may require dialysis if her renal function doesn't improve. We'll check her CBC, basic metabolic panel, phosphorus, intact PTH. We'll follow patient with you thank you  [facility] S 02/01/2011, 2:29 PM

## 2011-02-01 NOTE — Progress Notes (Signed)
UR Chart Review Completed  

## 2011-02-01 NOTE — Progress Notes (Signed)
Pt tolerated orthostatic fair.  She did not have any complaints during that time.

## 2011-02-01 NOTE — Progress Notes (Signed)
Patient has large scars to her chest and abdomen.

## 2011-02-02 ENCOUNTER — Ambulatory Visit: Payer: Medicare Other | Admitting: Family Medicine

## 2011-02-02 LAB — GLUCOSE, CAPILLARY
Glucose-Capillary: 173 mg/dL — ABNORMAL HIGH (ref 70–99)
Glucose-Capillary: 87 mg/dL (ref 70–99)
Glucose-Capillary: 175 mg/dL — ABNORMAL HIGH (ref 70–99)

## 2011-02-02 LAB — BASIC METABOLIC PANEL
BUN: 68 mg/dL — ABNORMAL HIGH (ref 6–23)
Chloride: 89 mEq/L — ABNORMAL LOW (ref 96–112)
Creatinine, Ser: 3.67 mg/dL — ABNORMAL HIGH (ref 0.50–1.10)
Glucose, Bld: 170 mg/dL — ABNORMAL HIGH (ref 70–99)
Potassium: 3.6 mEq/L (ref 3.5–5.1)

## 2011-02-02 LAB — CBC
HCT: 32 % — ABNORMAL LOW (ref 36.0–46.0)
Hemoglobin: 10.6 g/dL — ABNORMAL LOW (ref 12.0–15.0)
MCH: 24.5 pg — ABNORMAL LOW (ref 26.0–34.0)
MCHC: 33.1 g/dL (ref 30.0–36.0)
RDW: 18.6 % — ABNORMAL HIGH (ref 11.5–15.5)

## 2011-02-02 LAB — PTH, INTACT AND CALCIUM
Calcium, Total (PTH): 7.2 mg/dL — ABNORMAL LOW (ref 8.4–10.5)
PTH: 648.4 pg/mL — ABNORMAL HIGH (ref 14.0–72.0)

## 2011-02-02 LAB — URINE CULTURE

## 2011-02-02 LAB — HEPATITIS B CORE ANTIBODY, IGM: Hep B C IgM: NEGATIVE

## 2011-02-02 MED ORDER — EPOETIN ALFA 10000 UNIT/ML IJ SOLN: 10000.0000 [IU] | INTRAMUSCULAR | Status: DC

## 2011-02-02 MED ORDER — TRAZODONE HCL 50 MG PO TABS
25.0000 mg | ORAL_TABLET | Freq: Every day | ORAL | Status: DC
  Administered 2011-02-02 – 2011-02-04 (×3): 25 mg via ORAL
  Filled 2011-02-02 (×3): qty 1

## 2011-02-02 NOTE — Progress Notes (Signed)
Chart reviewed.  Subjective: Wants to go home. No new complaints. Objective: Vital signs in last 24 hours: Filed Vitals:   02/02/11 0443 02/02/11 0446 02/02/11 0612 02/02/11 1219  BP: 140/61 158/62 158/62 131/61  Pulse: 51 50  85  Temp: 97 F (36.1 C)     TempSrc: Oral     Resp: 18     Height:      Weight:      SpO2: 100%   96%   Weight change:   Intake/Output Summary (Last 24 hours) at 02/02/11 1411 Last data filed at 02/02/11 1300  Gross per 24 hour  Intake 2355.25 ml  Output    300 ml  Net 2055.25 ml   Physical Exam: Gen.: Occasionally agitated. Oriented. Lungs clear to auscultation bilaterally without wheeze rhonchi or rales Cardiovascular regular rate rhythm without murmurs gallops rubs Abdomen obese soft nontender Extremities no clubbing cyanosis or edema   Lab Results: Basic Metabolic Panel:  Lab [id number] 0614 02/01/11 1750  NA 129* --  K 3.6 --  CL 89* --  CO2 31 --  GLUCOSE 170* --  BUN 68* --  CREATININE 3.67* --  CALCIUM 8.4 7.2*  MG -- --  PHOS 4.9* --   Liver Function Tests:  Lab 02/01/11 1750 02/01/11 0154  AST -- 12  ALT 5 7  ALKPHOS -- 75  BILITOT -- 0.4  PROT -- 6.3  ALBUMIN -- 3.0*   No results found for this basename: LIPASE:2,AMYLASE:2 in the last 168 hours No results found for this basename: AMMONIA:2 in the last 168 hours CBC:  Lab 02/02/11 0614 02/01/11 0154 01/31/11 2034  WBC 6.3 7.3 --  NEUTROABS -- -- 4.8  HGB 10.6* 10.3* --  HCT 32.0* 30.5* --  MCV 73.9* 72.6* --  PLT 173 175 --   Cardiac Enzymes:  Lab 02/01/11 1750 02/01/11 0954 02/01/11 0154  CKTOTAL 72 86 87  CKMB 2.0 2.2 2.1  CKMBINDEX -- -- --  TROPONINI <0.30 <0.30 <0.30   BNP: No results found for this basename: PROBNP:3 in the last 168 hours D-Dimer: No results found for this basename: DDIMER:2 in the last 168 hours CBG:  Lab 02/02/11 1120 02/02/11 0729 02/01/11 1647 02/01/11 1209 02/01/11 0839 01/31/11 1919  GLUCAP 204* 173* 167* 186* 118* 260*     Hemoglobin A1C:  Lab 02/01/11 0154  HGBA1C 6.6*   Fasting Lipid Panel: No results found for this basename: CHOL,HDL,LDLCALC,TRIG,CHOLHDL,LDLDIRECT in the last 168 hours Thyroid Function Tests: No results found for this basename: TSH,T4TOTAL,FREET4,T3FREE,THYROIDAB in the last 168 hours Coagulation: No results found for this basename: LABPROT:4,INR:4 in the last 168 hours Anemia Panel: No results found for this basename: VITAMINB12,FOLATE,FERRITIN,TIBC,IRON,RETICCTPCT in the last 168 hours Urine Drug Screen: Drugs of Abuse  No results found for this basename: labopia, cocainscrnur, labbenz, amphetmu, thcu, labbarb    Alcohol Level: No results found for this basename: ETH:2 in the last 168 hours Urinalysis: Essentially negative.  Micro Results: No results found for this or any previous visit (from the past 240 hour(s)). Studies/Results: Dg Abd 1 View  01/31/2011  *RADIOLOGY REPORT*  Clinical Data: Left-sided abdominal pain; constipation.  ABDOMEN - 1 VIEW  Comparison: Abdominal radiograph and CT of the abdomen and pelvis performed 01/15/2011  Findings: The visualized bowel gas pattern is unremarkable.  Stool is noted filling the cecum and ascending colon, and also within the sigmoid colon; no abnormal dilatation of small bowel loops is seen to suggest small bowel obstruction.  No free intra-abdominal air  is identified, though evaluation for free air is limited on a single supine view.  The visualized osseous structures are within normal limits; the sacroiliac joints are unremarkable in appearance.  Diffuse vascular calcifications are noted.  IMPRESSION:  1.  Stool noted filling the cecum and ascending colon, without evidence of significant constipation.  No free intra-abdominal air seen. 2.  Diffuse vascular calcifications noted.  Original Report Authenticated By: Santa Lighter, M.D.   Ct Head Wo Contrast  01/31/2011  *RADIOLOGY REPORT*  Clinical Data: Weakness.  CT HEAD WITHOUT  CONTRAST  Technique:  Contiguous axial images were obtained from the base of the skull through the vertex without contrast.  Comparison: Head CT scan 01/11/2011.  Findings: Again seen is cortical atrophy and chronic microvascular ischemic change.  No evidence of acute infarction, hemorrhage, mass lesion, mass effect, midline shift or abnormal extra-axial fluid collection.  Mucosal thickening left maxillary sinus is noted.  IMPRESSION: No acute finding.  Original Report Authenticated By: Arvid Right. Luther Parody, M.D.   Dg Chest Port 1 View  01/31/2011  *RADIOLOGY REPORT*  Clinical Data: Shortness of breath and cough.  PORTABLE CHEST - 1 VIEW  Comparison: Plain film chest 01/21/2011.  Findings: There is elevation of the left hemidiaphragm with some subsegmental atelectasis in the left base.  The right lung is clear.  There is cardiomegaly.  IMPRESSION: Cardiomegaly and subsegmental atelectasis left lung base.  Original Report Authenticated By: Arvid Right. D'ALESSIO, M.D.    Scheduled Meds:   . amLODipine  10 mg Oral Q0700  . aspirin EC  81 mg Oral Daily  . calcitRIOL  0.25 mcg Oral Q0700  . citalopram  20 mg Oral Q0700  . cloNIDine  0.1 mg Oral TID  . docusate sodium  100 mg Oral BID  . epoetin alfa  10,000 Units Intravenous Q T,Th,Sa-HD  . heparin  5,000 Units Subcutaneous Q8H  . hydrALAZINE  100 mg Oral Q8H  . insulin aspart  0-9 Units Subcutaneous TID WC  . insulin aspart protamine-insulin aspart  5-8 Units Subcutaneous BID WC  . isosorbide mononitrate  60 mg Oral Daily  . levothyroxine  25 mcg Oral QAC breakfast  . potassium chloride  30 mEq Oral BID  . simvastatin  10 mg Oral q1800  . sodium chloride  3 mL Intravenous Q12H  . tuberculin  5 Units Intradermal Once  . DISCONTD: epoetin alfa  10,000 Units Intravenous 3 times weekly  . DISCONTD: guanFACINE  2 mg Oral QHS   Continuous Infusions:   . 0.9 % NaCl with KCl 20 mEq / L 75 mL/hr at 02/02/11 1300   PRN Meds:.acetaminophen, albuterol,  bisacodyl, ondansetron (ZOFRAN) IV, ondansetron, DISCONTD: acetaminophen, DISCONTD: albuterol Assessment/Plan: Principal Problem:  *Syncope Active Problems:  Benign hypertensive heart and kidney disease and CKD stage V  Bradycardia  ANEMIA OF RENAL FAILURE  HYPERTENSION  Chronic diastolic congestive heart failure  Hyponatremia  DIABETES MELLITUS, CONTROLLED, WITH COMPLICATIONS  HYPERLIPIDEMIA  Constipation  Hypokalemia  For dialysis per Dr. Lowanda Foster. Agree with Dr. Recardo Evangelist recommendations. Patient needs more assistance than what she's been able to get at home. She continues to fail at home requiring recurrent hospitalizations. She would be best served by an assisted living or skilled nursing environment, but she has refused in the past. She became quite upset about it today. Convincing her may be a struggle. Care management has spoken to family who hopefully will get involved with the safest discharge plan.   LOS: 2 days   Olivea Sonnen L  02/02/2011, 2:11 PM

## 2011-02-02 NOTE — Progress Notes (Signed)
Paged Dr. Conley Canal to request medication to help pt sleep tonight (as family requested).  Order given.

## 2011-02-02 NOTE — Consult Note (Signed)
Reason for Consult: syncope and bradycardia  Referring Physician: [facility]  Cardiologist: Dr. Debara Pickett  At [facility]  [name] is an 78 y.o. female.    Chief Complaint:  Shortness of breath  HPI: Patient is 78 year old [place] female with multiple medical problems including diabetes mellitus, hypertension, history of prior stroke with residual right-sided weakness, history of chronic kidney disease stage V with left AV fistula but not on hemodialysis yet, recently discharged from [facility] where she was treated for diastolic heart failure. Before that she had had 2 other hospitalizations in the month of [facility]. This time she had a syncopal event at home that occurred while she was upright and resolved spontaneously. She has been relatively bradycardic since. although always in sinus rhythm. The ECG from January 20th shows sinus bradycardia with a first degree AV block the without evidence of intraventricular conduction delay. During her previous hospitalization  at [facility] long she had a couple of brief episodes of nonsustained atrial tachycardia but no meaningful bradycardia.   (no history of cardiac cath that I can locate) Negative nuclear stress test 2006, though did brady to a junctional rhythm during dobutamine stress portion.  She also has CKD, stage 5, followed by Dr Hinda Lenis in [place].  Her creatinine clearance is around 10 mL per minute.  She has lung disease on chronic home oxygen.   2D Echo 01/18/11:   - Left ventricle: The cavity size was normal. Wall thickness was increased in a pattern of mild LVH. Systolic function was normal. The estimated ejection fraction was in the range of 60% to 65%. Wall motion was normal; there were no regional wall motion abnormalities. Features are consistent with a pseudonormal left ventricular filling pattern, with concomitant abnormal relaxation and increased filling  pressure (grade 2 diastolic dysfunction). Doppler parameters are consistent with elevated mean left atrial filling pressure. - Mitral valve: Mild regurgitation. - Left atrium: The atrium was mildly dilated. - Atrial septum: No defect or patent foramen ovale was identified. - Pulmonary arteries: Systolic pressure was mildly increased. PA peak pressure: 76mm Hg (S).  Other history as below.   Past Medical History  Diagnosis Date  . Anxiety   . Diabetes mellitus, type 2   . Hypertension   . Stroke     residual right side weakness and pain  . IBS (irritable bowel syndrome)     with costipation  . Overactive bladder   . Cataracts, both eyes   . Gynecologic cancer   . Hyperlipidemia   . CKD (chronic kidney disease), stage V   . Anemia in CKD (chronic kidney disease)   . SVT (supraventricular tachycardia)   . CHF (congestive heart failure)   . Arthritis     gout  . SOB (shortness of breath)     nml spirometry 09/05/06    Past Surgical History  Procedure Date  . Vesicovaginal fistula closure w/ tah   . Av fistula placement, brachiocephalic [id number]    left arm   by Dr. Bridgett Larsson  . Tee without cardioversion 2011    2/2 Strep Bovis infection    History reviewed. No pertinent family history. Social History:  reports that she has never smoked. She has never used smokeless tobacco. She reports that she does not drink alcohol or use illicit drugs.  Allergies: No Known Allergies  Medications Prior to Admission  Medication Dose Route Frequency Provider Last Rate Last Dose  . 0.9 % NaCl with KCl 20  mEq/ L  infusion   Intravenous Continuous Rexene Alberts, MD 75 mL/hr at 02/02/11 0900    . acetaminophen (TYLENOL) tablet 650 mg  650 mg Oral Q6H PRN Bonnielee Haff, MD      . albuterol (PROVENTIL) (5 MG/ML) 0.5% nebulizer solution 2.5 mg  2.5 mg Nebulization Q2H PRN Bonnielee Haff, MD      . amLODipine (NORVASC) tablet 10 mg  10 mg Oral Q0700 Bonnielee Haff, MD   10 mg at 02/02/11 0612    . aspirin EC tablet 81 mg  81 mg Oral Daily Bonnielee Haff, MD   81 mg at 02/02/11 0918  . bisacodyl (DULCOLAX) EC tablet 5 mg  5 mg Oral Daily PRN Bonnielee Haff, MD      . calcitRIOL (ROCALTROL) capsule 0.25 mcg  0.25 mcg Oral Q0700 Bonnielee Haff, MD   0.25 mcg at 02/02/11 [id number]  . citalopram (CELEXA) tablet 20 mg  20 mg Oral Q0700 Bonnielee Haff, MD   20 mg at 02/02/11 0615  . cloNIDine (CATAPRES) tablet 0.1 mg  0.1 mg Oral TID Rexene Alberts, MD   0.1 mg at 02/02/11 0918  . docusate sodium (COLACE) capsule 100 mg  100 mg Oral BID Bonnielee Haff, MD   100 mg at 02/02/11 0918  . epoetin alfa (EPOGEN,PROCRIT) injection 10,000 Units  10,000 Units Intravenous Q T,Th,Sa-HD Delfina Redwood, MD      . guanFACINE (TENEX) tablet 2 mg  2 mg Oral QHS Bonnielee Haff, MD      . heparin injection 5,000 Units  5,000 Units Subcutaneous Q8H Bonnielee Haff, MD   5,000 Units at 02/02/11 0617  . hydrALAZINE (APRESOLINE) tablet 100 mg  100 mg Oral Q8H Bonnielee Haff, MD   100 mg at 02/02/11 [id number]  . insulin aspart (novoLOG) injection 0-9 Units  0-9 Units Subcutaneous TID WC Bonnielee Haff, MD   3 Units at 02/02/11 1215  . insulin aspart protamine-insulin aspart (NOVOLOG 70/30) injection 5-8 Units  5-8 Units Subcutaneous BID WC Bonnielee Haff, MD   5 Units at 02/02/11 0915  . isosorbide mononitrate (IMDUR) 24 hr tablet 60 mg  60 mg Oral Daily Bonnielee Haff, MD   60 mg at 02/02/11 0918  . levothyroxine (SYNTHROID, LEVOTHROID) tablet 25 mcg  25 mcg Oral QAC breakfast Bonnielee Haff, MD   25 mcg at 02/02/11 0804  . ondansetron (ZOFRAN) tablet 4 mg  4 mg Oral Q6H PRN Bonnielee Haff, MD       Or  . ondansetron (ZOFRAN) injection 4 mg  4 mg Intravenous Q6H PRN Bonnielee Haff, MD      . potassium chloride (K-DUR,KLOR-CON) CR tablet 30 mEq  30 mEq Oral BID Rexene Alberts, MD   30 mEq at 02/01/11 2245  . simvastatin (ZOCOR) tablet 10 mg  10 mg Oral q1800 Bonnielee Haff, MD   10 mg at 02/01/11 1751  . sodium chloride 0.9 %  injection 3 mL  3 mL Intravenous Q12H Bonnielee Haff, MD   3 mL at 02/02/11 0918  . tuberculin injection 5 Units  5 Units Intradermal Once Harriett Sine, MD   5 Units at 02/01/11 1743  . DISCONTD: acetaminophen (TYLENOL) suppository 650 mg  650 mg Rectal Q6H PRN Bonnielee Haff, MD      . DISCONTD: albuterol (PROVENTIL HFA;VENTOLIN HFA) 108 (90 BASE) MCG/ACT inhaler 2 puff  2 puff Inhalation Q2H PRN Bonnielee Haff, MD      . DISCONTD: cloNIDine (CATAPRES) tablet 0.2 mg  0.2 mg Oral TID Langley Gauss  Fisher, MD   0.2 mg at 02/01/11 0927  . DISCONTD: epoetin alfa (EPOGEN,PROCRIT) injection 10,000 Units  10,000 Units Intravenous 3 times weekly Harriett Sine, MD      . DISCONTD: furosemide (LASIX) tablet 160 mg  160 mg Oral BID Bonnielee Haff, MD   160 mg at 02/01/11 0915  . DISCONTD: metolazone (ZAROXOLYN) tablet 2.5 mg  2.5 mg Oral Daily Bonnielee Haff, MD   2.5 mg at 02/01/11 [id number]  . DISCONTD: potassium chloride (K-DUR,KLOR-CON) CR tablet 10 mEq  10 mEq Oral Daily Bonnielee Haff, MD   10 mEq at 02/01/11 [id number]   Medications Prior to Admission  Medication Sig Dispense Refill  . albuterol (PROVENTIL HFA;VENTOLIN HFA) 108 (90 BASE) MCG/ACT inhaler Inhale 2 puffs into the lungs every 2 (two) hours as needed for wheezing or shortness of breath (cough).  1 Inhaler  0  . amLODipine (NORVASC) 10 MG tablet Take 10 mg by mouth every morning.        . calcitRIOL (ROCALTROL) 0.25 MCG capsule Take 0.25 mcg by mouth every morning.       . Carboxymethylcellul-Glycerin (OPTIVE) 0.5-0.9 % SOLN Apply to eye. One drop in both eyes two times a day        . carvedilol (COREG) 25 MG tablet Take 1 tablet (25 mg total) by mouth 2 (two) times daily with a meal.  60 tablet  1  . ciprofloxacin (CIPRO) 250 MG tablet Take 1 tablet (250 mg total) by mouth daily.  2 tablet  0  . citalopram (CELEXA) 20 MG tablet Take 20 mg by mouth every morning.        . cloNIDine (CATAPRES) 0.2 MG tablet Take 1 tablet (0.2 mg total) by mouth  3 (three) times daily.  90 tablet  0  . feeding supplement (GLUCERNA SHAKE) LIQD Take 237 mLs by mouth daily.        . fluticasone (FLONASE) 50 MCG/ACT nasal spray 2 sprays by Nasal route daily.        . furosemide (LASIX) 80 MG tablet Take 2 tablets (160 mg total) by mouth 2 (two) times daily.  60 tablet  0  . hydrALAZINE (APRESOLINE) 100 MG tablet Take 1 tablet (100 mg total) by mouth every 8 (eight) hours.  90 tablet  1  . insulin aspart protamine-insulin aspart (NOVOLOG 70/30) (70-30) 100 UNIT/ML injection Inject 5-8 Units into the skin 2 (two) times daily with a meal. Inject 8 units every morning and inject 5 units every evening      . iron polysaccharides (POLY-IRON 150) 150 MG capsule Take 150 mg by mouth every morning.       . isosorbide mononitrate (IMDUR) 60 MG 24 hr tablet Take 1 tablet (60 mg total) by mouth daily.  30 tablet  0  . levothyroxine (SYNTHROID, LEVOTHROID) 25 MCG tablet Take 1 tablet (25 mcg total) by mouth daily before breakfast.  30 tablet  0  . metolazone (ZAROXOLYN) 2.5 MG tablet Take 1 tablet (2.5 mg total) by mouth daily.  30 tablet  0  . potassium chloride (K-DUR) 10 MEQ tablet Take 1 tablet (10 mEq total) by mouth daily. Take with your evening dose of lasix  30 tablet  6  . TENEX 2 MG tablet TAKE ONE TABLET BY MOUTH AT BEDTIME.  30 each  3  . ZOCOR 10 MG tablet TAKE (1) TABLET BY MOUTH IN THE EVENING.  30 each  3   Social History: reports that she has never smoked.  She has never used smokeless tobacco. She reports that she does not drink alcohol or use illicit drugs. She has repeatedly refused attempts at assisted living or nursing home placement over the last multiple years.  Family History: There is a history of, diabetes, and hypertension in the family.   Review of Systems - History obtained from the patient  General ROS: negative  Psychological ROS: negative  Ophthalmic ROS: negative  ENT ROS: negative  Allergy and Immunology ROS: negative  Hematological  and Lymphatic ROS: negative  Endocrine ROS: negative  Respiratory ROS: negative  Cardiovascular ROS: negative  Gastrointestinal ROS: negative  Genito-Urinary ROS: no dysuria, trouble voiding, or hematuria  negative  Musculoskeletal ROS: negative  Neurological ROS: negative  Dermatological ROS: negative   Physical exam:  General:A&O X 3, MAE, no acute distress, pleasant affect  Skin:W&D brisk capillary refill  HEENT:normocephalic, sclera clear  Neck:supple, no JVD no bruits  [id number] RRR Soft systolic murmur.  Lungs:few rales in the bases  Abd:+ BS, soft, nontender  Ext:tr. Edema.  Neuro:A& O X 3, MAE, follows commands, but poor memory    Results for orders placed during the hospital encounter of 01/31/11 (from the past 48 hour(s))  GLUCOSE, CAPILLARY     Status: Abnormal   Collection Time   01/31/11  7:19 PM      Component Value Range Comment   Glucose-Capillary 260 (*) 70 - 99 (mg/dL)   BASIC METABOLIC PANEL     Status: Abnormal   Collection Time   01/31/11  8:34 PM      Component Value Range Comment   Sodium 125 (*) 135 - 145 (mEq/L)    Potassium 3.3 (*) 3.5 - 5.1 (mEq/L)    Chloride 82 (*) 96 - 112 (mEq/L)    CO2 27  19 - 32 (mEq/L)    Glucose, Bld 246 (*) 70 - 99 (mg/dL)    BUN 66 (*) 6 - 23 (mg/dL)    Creatinine, Ser 3.71 (*) 0.50 - 1.10 (mg/dL)    Calcium 9.0  8.4 - 10.5 (mg/dL)    GFR calc non Af Amer 10 (*) >90 (mL/min)    GFR calc Af Amer 12 (*) >90 (mL/min)   CBC     Status: Abnormal   Collection Time   01/31/11  8:34 PM      Component Value Range Comment   WBC 6.9  4.0 - 10.5 (K/uL)    RBC 4.45  3.87 - 5.11 (MIL/uL)    Hemoglobin 10.8 (*) 12.0 - 15.0 (g/dL)    HCT 32.4 (*) 36.0 - 46.0 (%)    MCV 72.8 (*) 78.0 - 100.0 (fL)    MCH 24.3 (*) 26.0 - 34.0 (pg)    MCHC 33.3  30.0 - 36.0 (g/dL)    RDW 18.1 (*) 11.5 - 15.5 (%)    Platelets 169  150 - 400 (K/uL)   DIFFERENTIAL     Status: Normal   Collection Time   01/31/11  8:34 PM      Component Value  Range Comment   Neutrophils Relative 69  43 - 77 (%)    Neutro Abs 4.8  1.7 - 7.7 (K/uL)    Lymphocytes Relative 19  12 - 46 (%)    Lymphs Abs 1.3  0.7 - 4.0 (K/uL)    Monocytes Relative 11  3 - 12 (%)    Monocytes Absolute 0.8  0.1 - 1.0 (K/uL)    Eosinophils Relative 0  0 - 5 (%)  Eosinophils Absolute 0.0  0.0 - 0.7 (K/uL)    Basophils Relative 0  0 - 1 (%)    Basophils Absolute 0.0  0.0 - 0.1 (K/uL)   TROPONIN I     Status: Normal   Collection Time   01/31/11  8:34 PM      Component Value Range Comment   Troponin I <0.30  <0.30 (ng/mL)   URINALYSIS, ROUTINE W REFLEX MICROSCOPIC     Status: Abnormal   Collection Time   01/31/11  8:39 PM      Component Value Range Comment   Color, Urine YELLOW  YELLOW     APPearance CLEAR  CLEAR     Specific Gravity, Urine 1.010  1.005 - 1.030     pH 6.5  5.0 - 8.0     Glucose, UA NEGATIVE  NEGATIVE (mg/dL)    Hgb urine dipstick NEGATIVE  NEGATIVE     Bilirubin Urine NEGATIVE  NEGATIVE     Ketones, ur NEGATIVE  NEGATIVE (mg/dL)    Protein, ur 30 (*) NEGATIVE (mg/dL)    Urobilinogen, UA 0.2  0.0 - 1.0 (mg/dL)    Nitrite NEGATIVE  NEGATIVE     Leukocytes, UA NEGATIVE  NEGATIVE    URINE MICROSCOPIC-ADD ON     Status: Abnormal   Collection Time   01/31/11  8:39 PM      Component Value Range Comment   Squamous Epithelial / LPF FEW (*) RARE     WBC, UA 3-6  <3 (WBC/hpf)   HEMOGLOBIN A1C     Status: Abnormal   Collection Time   02/01/11  1:54 AM      Component Value Range Comment   Hemoglobin A1C 6.6 (*) <5.7 (%)    Mean Plasma Glucose 143 (*) <117 (mg/dL)   CARDIAC PANEL(CRET KIN+CKTOT+MB+TROPI)     Status: Normal   Collection Time   02/01/11  1:54 AM      Component Value Range Comment   Total CK 87  7 - 177 (U/L)    CK, MB 2.1  0.3 - 4.0 (ng/mL)    Troponin I <0.30  <0.30 (ng/mL)    Relative Index RELATIVE INDEX IS INVALID  0.0 - 2.5    COMPREHENSIVE METABOLIC PANEL     Status: Abnormal   Collection Time   02/01/11  1:54 AM       Component Value Range Comment   Sodium 127 (*) 135 - 145 (mEq/L)    Potassium 2.8 (*) 3.5 - 5.1 (mEq/L)    Chloride 85 (*) 96 - 112 (mEq/L)    CO2 34 (*) 19 - 32 (mEq/L)    Glucose, Bld 63 (*) 70 - 99 (mg/dL)    BUN 67 (*) 6 - 23 (mg/dL)    Creatinine, Ser 3.75 (*) 0.50 - 1.10 (mg/dL)    Calcium 8.9  8.4 - 10.5 (mg/dL)    Total Protein 6.3  6.0 - 8.3 (g/dL)    Albumin 3.0 (*) 3.5 - 5.2 (g/dL)    AST 12  0 - 37 (U/L)    ALT 7  0 - 35 (U/L)    Alkaline Phosphatase 75  39 - 117 (U/L)    Total Bilirubin 0.4  0.3 - 1.2 (mg/dL)    GFR calc non Af Amer 10 (*) >90 (mL/min)    GFR calc Af Amer 12 (*) >90 (mL/min)   CBC     Status: Abnormal   Collection Time   02/01/11  1:54 AM  Component Value Range Comment   WBC 7.3  4.0 - 10.5 (K/uL)    RBC 4.20  3.87 - 5.11 (MIL/uL)    Hemoglobin 10.3 (*) 12.0 - 15.0 (g/dL)    HCT 30.5 (*) 36.0 - 46.0 (%)    MCV 72.6 (*) 78.0 - 100.0 (fL)    MCH 24.5 (*) 26.0 - 34.0 (pg)    MCHC 33.8  30.0 - 36.0 (g/dL)    RDW 18.0 (*) 11.5 - 15.5 (%)    Platelets 175  150 - 400 (K/uL)   GLUCOSE, CAPILLARY     Status: Abnormal   Collection Time   02/01/11  8:39 AM      Component Value Range Comment   Glucose-Capillary 118 (*) 70 - 99 (mg/dL)   CARDIAC PANEL(CRET KIN+CKTOT+MB+TROPI)     Status: Normal   Collection Time   02/01/11  9:54 AM      Component Value Range Comment   Total CK 86  7 - 177 (U/L)    CK, MB 2.2  0.3 - 4.0 (ng/mL)    Troponin I <0.30  <0.30 (ng/mL)    Relative Index RELATIVE INDEX IS INVALID  0.0 - 2.5    GLUCOSE, CAPILLARY     Status: Abnormal   Collection Time   02/01/11 12:09 PM      Component Value Range Comment   Glucose-Capillary 186 (*) 70 - 99 (mg/dL)    Comment 1 Notify RN     GLUCOSE, CAPILLARY     Status: Abnormal   Collection Time   02/01/11  4:47 PM      Component Value Range Comment   Glucose-Capillary 167 (*) 70 - 99 (mg/dL)    Comment 1 Notify RN      Comment 2 Documented in Chart     CARDIAC PANEL(CRET  KIN+CKTOT+MB+TROPI)     Status: Normal   Collection Time   02/01/11  5:50 PM      Component Value Range Comment   Total CK 72  7 - 177 (U/L)    CK, MB 2.0  0.3 - 4.0 (ng/mL)    Troponin I <0.30  <0.30 (ng/mL)    Relative Index RELATIVE INDEX IS INVALID  0.0 - 2.5    HEPATITIS B SURFACE ANTIGEN     Status: Normal   Collection Time   02/01/11  5:50 PM      Component Value Range Comment   Hepatitis B Surface Ag NEGATIVE  NEGATIVE    ALT     Status: Normal   Collection Time   02/01/11  5:50 PM      Component Value Range Comment   ALT 5  0 - 35 (U/L)   HEPATITIS B SURFACE ANTIBODY     Status: Normal   Collection Time   02/01/11  5:50 PM      Component Value Range Comment   Hep B S Ab NEGATIVE  NEGATIVE    BASIC METABOLIC PANEL     Status: Abnormal   Collection Time   02/02/11  6:14 AM      Component Value Range Comment   Sodium 129 (*) 135 - 145 (mEq/L)    Potassium 3.6  3.5 - 5.1 (mEq/L) DELTA CHECK NOTED   Chloride 89 (*) 96 - 112 (mEq/L)    CO2 31  19 - 32 (mEq/L)    Glucose, Bld 170 (*) 70 - 99 (mg/dL)    BUN 68 (*) 6 - 23 (mg/dL)    Creatinine, Ser 3.67 (*)  0.50 - 1.10 (mg/dL)    Calcium 8.4  8.4 - 10.5 (mg/dL)    GFR calc non Af Amer 11 (*) >90 (mL/min)    GFR calc Af Amer 12 (*) >90 (mL/min)   CBC     Status: Abnormal   Collection Time   02/02/11  6:14 AM      Component Value Range Comment   WBC 6.3  4.0 - 10.5 (K/uL)    RBC 4.33  3.87 - 5.11 (MIL/uL)    Hemoglobin 10.6 (*) 12.0 - 15.0 (g/dL)    HCT 32.0 (*) 36.0 - 46.0 (%)    MCV 73.9 (*) 78.0 - 100.0 (fL)    MCH 24.5 (*) 26.0 - 34.0 (pg)    MCHC 33.1  30.0 - 36.0 (g/dL)    RDW 18.6 (*) 11.5 - 15.5 (%)    Platelets 173  150 - 400 (K/uL)   PHOSPHORUS     Status: Abnormal   Collection Time   02/02/11  6:14 AM      Component Value Range Comment   Phosphorus 4.9 (*) 2.3 - 4.6 (mg/dL)   GLUCOSE, CAPILLARY     Status: Abnormal   Collection Time   02/02/11  7:29 AM      Component Value Range Comment   Glucose-Capillary  173 (*) 70 - 99 (mg/dL)    Comment 1 Notify RN      Comment 2 Documented in Chart     GLUCOSE, CAPILLARY     Status: Abnormal   Collection Time   02/02/11 11:20 AM      Component Value Range Comment   Glucose-Capillary 204 (*) 70 - 99 (mg/dL)    Comment 1 Notify RN      Comment 2 Documented in Chart      Dg Abd 1 View  01/31/2011  *RADIOLOGY REPORT*  Clinical Data: Left-sided abdominal pain; constipation.  ABDOMEN - 1 VIEW  Comparison: Abdominal radiograph and CT of the abdomen and pelvis performed 01/15/2011  Findings: The visualized bowel gas pattern is unremarkable.  Stool is noted filling the cecum and ascending colon, and also within the sigmoid colon; no abnormal dilatation of small bowel loops is seen to suggest small bowel obstruction.  No free intra-abdominal air is identified, though evaluation for free air is limited on a single supine view.  The visualized osseous structures are within normal limits; the sacroiliac joints are unremarkable in appearance.  Diffuse vascular calcifications are noted.  IMPRESSION:  1.  Stool noted filling the cecum and ascending colon, without evidence of significant constipation.  No free intra-abdominal air seen. 2.  Diffuse vascular calcifications noted.  Original Report Authenticated By: Santa Lighter, M.D.   Ct Head Wo Contrast  01/31/2011  *RADIOLOGY REPORT*  Clinical Data: Weakness.  CT HEAD WITHOUT CONTRAST  Technique:  Contiguous axial images were obtained from the base of the skull through the vertex without contrast.  Comparison: Head CT scan 01/11/2011.  Findings: Again seen is cortical atrophy and chronic microvascular ischemic change.  No evidence of acute infarction, hemorrhage, mass lesion, mass effect, midline shift or abnormal extra-axial fluid collection.  Mucosal thickening left maxillary sinus is noted.  IMPRESSION: No acute finding.  Original Report Authenticated By: Arvid Right. Luther Parody, M.D.   Dg Chest Port 1 View  01/31/2011   *RADIOLOGY REPORT*  Clinical Data: Shortness of breath and cough.  PORTABLE CHEST - 1 VIEW  Comparison: Plain film chest 01/21/2011.  Findings: There is elevation of the left hemidiaphragm with  some subsegmental atelectasis in the left base.  The right lung is clear.  There is cardiomegaly.  IMPRESSION: Cardiomegaly and subsegmental atelectasis left lung base.  Original Report Authenticated By: Arvid Right. D'ALESSIO, M.D.    ROS: General:No colds or fevers Skin:no rashes HEENT:denies blurred vision CV:+ chest pain PUL:+SOB GI:no diarrhea GU:no complaints MS:no complaints NEURO:no syncope Endo:+ diabetes   Blood pressure 131/61, pulse 85, temperature 97 F (36.1 C), temperature source Oral, resp. rate 18, height 5\' 5"  (1.651 m), weight 80.74 kg (178 lb), last menstrual period 12/24/2010, SpO2 96.00%. PE: General:A&O X 3, MAE, no acute distress, pleasant affect Skin:W&D brisk capillary refill HEENT:normocephalic, sclera clear Neck:supple, no JVD no bruits [id number] RRR  Soft systolic murmur. Lungs:few rales in the bases Abd:+ BS, soft, nontender Ext:tr. Edema. Neuro:A& O X 3, MAE, follows commands, but poor memory     Assessment/Plan Patient Active Problem List  Diagnoses  . DIABETES MELLITUS, CONTROLLED, WITH COMPLICATIONS  . HYPERLIPIDEMIA  . ANEMIA OF RENAL FAILURE  . ANXIETY  . CATARACT NOS  . HYPERTENSION  . CONGESTIVE HEART FAILURE UNSPECIFIED  . ALLERGIC RHINITIS  . IBS  . Chronic kidney disease, stage 4, severely decreased GFR  . ARTHRITIS  . DEPENDENT EDEMA, LEGS, BILATERAL  . DYSPNEA ON EXERTION  . CEREBROVASCULAR ACCIDENT, HX OF  . Arm bruise  . Colon polyps  . Insomnia  . Fall  . Constipation  . Altered level of consciousness  . Chronic respiratory failure  . Nausea & vomiting  . SOB (shortness of breath)  . CKD (chronic kidney disease) stage 5, GFR less than 15 ml/min  . CHF, acute on chronic  . Syncope  . Chronic diastolic congestive heart failure    . Dizziness  . Hyponatremia  . Bradycardia  . UTI (lower urinary tract infection)  . Hypokalemia   ASSESSMENT: Her syncopal event is very likely related to bradycardia,in turn  likely due to carvedilol and clonidine the latter medication having been increased in dose during her last hospitalization. She is also on Tenex which may be compounding the side effects of clonidine. Since she had SVT recently carvedilol is likely to be the most beneficial of the 3 agents. I would stop that recommend stopping the Tenex and reducing the clonidine dose. Some rebound hypertension is to be expected. I think her blood pressure will become easier to manage once she is initiated on chronic hemodialysis.  I would recommend making these medication changes before implanting a pacemaker. I think with judicious adjustment of her antihypertensives we can avoid the device implantation, which can be associated with significant morbidity in a patient on chronic hemodialysis.  I also think hemodialysis will be necessary to prevent recurrence of hypervolemia related diastolic heart failure.  This is her fourth hospitalization in about 30 days. The family is considering long-term care options and I believe nursing home placement is very appropriate   Sanda Klein, MD, [facility] [phone] 02/02/2011, 12:32 PM

## 2011-02-02 NOTE — Progress Notes (Signed)
Informed by claudia of PT she is recommending al for pt at d/c. PT now going on dialysis for esrd. Spoke to daughter [name] concerning d/c plans, per [name] she agrees that [name] needs placement but she has refused in the [place] states she will get her sister and brother together to talk to [place] [facility] concerning placement  And also talk to the patient.

## 2011-02-02 NOTE — Evaluation (Signed)
Physical Therapy Evaluation Patient Details Name: [name] MRN: [id number] DOB: 03-Feb-1925 Today's Date: 02/02/2011  Problem List:  Patient Active Problem List  Diagnoses  . DIABETES MELLITUS, CONTROLLED, WITH COMPLICATIONS  . HYPERLIPIDEMIA  . ANEMIA OF RENAL FAILURE  . ANXIETY  . CATARACT NOS  . HYPERTENSION  . CONGESTIVE HEART FAILURE UNSPECIFIED  . ALLERGIC RHINITIS  . IBS  . Chronic kidney disease, stage 4, severely decreased GFR  . ARTHRITIS  . DEPENDENT EDEMA, LEGS, BILATERAL  . DYSPNEA ON EXERTION  . CEREBROVASCULAR ACCIDENT, HX OF  . Arm bruise  . Colon polyps  . Insomnia  . Fall  . Constipation  . Altered level of consciousness  . Chronic respiratory failure  . Nausea & vomiting  . SOB (shortness of breath)  . CKD (chronic kidney disease) stage 5, GFR less than 15 ml/min  . CHF, acute on chronic  . Syncope  . Chronic diastolic congestive heart failure  . Dizziness  . Hyponatremia  . Bradycardia  . UTI (lower urinary tract infection)  . Hypokalemia    Past Medical History:  Past Medical History  Diagnosis Date  . Anxiety   . Diabetes mellitus, type 2   . Hypertension   . Stroke     residual right side weakness and pain  . IBS (irritable bowel syndrome)     with costipation  . Overactive bladder   . Cataracts, both eyes   . Gynecologic cancer   . Hyperlipidemia   . CKD (chronic kidney disease), stage V   . Anemia in CKD (chronic kidney disease)   . SVT (supraventricular tachycardia)   . CHF (congestive heart failure)   . Arthritis     gout  . SOB (shortness of breath)     nml spirometry 09/05/06   Past Surgical History:  Past Surgical History  Procedure Date  . Vesicovaginal fistula closure w/ tah   . Av fistula placement, brachiocephalic [id number]    left arm   by Dr. Bridgett Larsson  . Tee without cardioversion 2011    2/2 Strep Bovis infection    PT Assessment/Plan/Recommendation PT Assessment Clinical Impression Statement: two  attempts made before pt would agree to work with me, the major excuse being that she was "too cold"...pt is very deconditioned at baseline and has numerous medical problems...rehab potential is poor, in my opinion, because she is so difficult to motivate...she is not safe to be at home without 24 hour assist and would be well suited to live in ACLF PT Recommendation/Assessment: Patient will need skilled PT in the acute care venue PT Problem List: Decreased strength;Decreased activity tolerance;Decreased mobility;Cardiopulmonary status limiting activity;Obesity Barriers to Discharge: Decreased caregiver support PT Therapy Diagnosis : Difficulty walking;Generalized weakness PT Plan PT Frequency: Min 2X/week PT Treatment/Interventions: Gait training;Therapeutic activities;Therapeutic exercise;Patient/family education PT Recommendation Follow Up Recommendations: Home health PT;Supervision/Assistance - 24 hour Equipment Recommended: None recommended by PT PT Goals  Acute Rehab PT Goals PT Goal Formulation: With patient Pt will go Supine/Side to Sit: with supervision Pt will go Sit to Supine/Side: with supervision Pt will go Sit to Stand: with supervision Pt will go Stand to Sit: with supervision Pt will Transfer Bed to Chair/Chair to Bed: with supervision Pt will Ambulate: 16 - 50 feet;with supervision;with rolling walker  PT Evaluation Precautions/Restrictions  Precautions Precautions: Fall Required Braces or Orthoses: No Restrictions Weight Bearing Restrictions: No Prior Functioning  Home Living Lives With: Alone Receives Help From: Personal care attendant   Cognition Cognition Arousal/Alertness: Awake/alert Overall  Cognitive Status: Appears within functional limits for tasks assessed Orientation Level: Oriented X4 Sensation/Coordination Sensation Light Touch: Appears Intact Coordination Gross Motor Movements are Fluid and Coordinated: Yes Fine Motor Movements are Fluid and  Coordinated: Yes Extremity Assessment RUE Assessment RUE Assessment: Within Functional Limits LUE Assessment LUE Assessment: Within Functional Limits Mobility (including Balance) Bed Mobility Supine to Sit: 4: Min assist;HOB flat Supine to Sit Details (indicate cue type and reason): pt needs assist to get to full sitting Sitting - Scoot to Edge of Bed: 6: Modified independent (Device/Increase time) Sitting - Scoot to Edge of Bed Details (indicate cue type and reason): very labored Transfers Sit to Stand: 4: Min assist Sit to Stand Details (indicate cue type and reason): pt "rocks" back and forth numerous times to get the momentum to stand...and then needs help to come to full stance Stand to Sit: 5: Supervision Stand to Sit Details: cues to get back against chair prior to sitting Ambulation/Gait Ambulation/Gait: Yes Ambulation/Gait Assistance: 5: Supervision Ambulation/Gait Assistance Details (indicate cue type and reason): cues to stay inside of walker Ambulation Distance (Feet): 20 Feet (then walked 15') Assistive device: Rolling walker Gait Pattern: Trunk flexed Stairs: No Wheelchair Mobility Wheelchair Mobility: No  Posture/Postural Control Posture/Postural Control: No significant limitations Balance Balance Assessed: No Exercise    End of Session PT - End of Session Equipment Utilized During Treatment: Gait belt Activity Tolerance: Treatment limited secondary to agitation Patient left: in chair;with call bell in reach Nurse Communication: Mobility status for transfers;Mobility status for ambulation General Behavior During Session: Agitated Cognition: WFL for tasks performed  [name] 02/02/2011, 12:31 PM

## 2011-02-02 NOTE — Progress Notes (Signed)
Subjective: Interval History: has no complaint of difficulty in breathing or orthopnea.. Patient denies also any nausea vomiting. Her appetite however remains poor.  Objective: Vital signs in last 24 hours: Temp:  [97 F (36.1 C)-98.6 F (37 C)] 97 F (36.1 C) (01/22 0443) Pulse Rate:  [46-61] 50  (01/22 0446) Resp:  [16-20] 18  (01/22 0443) BP: (106-158)/(56-67) 158/62 mmHg (01/22 0612) SpO2:  [97 %-100 %] 100 % (01/22 0443) FiO2 (%):  [4 %] 4 % (01/21 2114) Weight change:   Intake/Output from previous day: 01/21 0701 - 01/22 0700 In: 1390.3 [P.O.:240; I.V.:1150.3] Out: 300 [Urine:300] Intake/Output this shift:    General appearance: alert, cooperative and no distress Resp: clear to auscultation bilaterally Cardio: regular rate and rhythm, S1, S2 normal, no murmur, click, rub or gallop GI: soft, non-tender; bowel sounds normal; no masses,  no organomegaly Extremities: extremities normal, atraumatic, no cyanosis or edema  Lab Results:  [facility] 02/02/11 0614 02/01/11 0154  WBC 6.3 7.3  HGB 10.6* 10.3*  HCT 32.0* 30.5*  PLT 173 175   BMET:  Basename 02/02/11 0614 02/01/11 0154  NA 129* 127*  K 3.6 2.8*  CL 89* 85*  CO2 31 34*  GLUCOSE 170* 63*  BUN 68* 67*  CREATININE 3.67* 3.75*  CALCIUM 8.4 8.9   No results found for this basename: PTH:2 in the last 72 hours Iron Studies: No results found for this basename: IRON,TIBC,TRANSFERRIN,FERRITIN in the last 72 hours  Studies/Results: Dg Abd 1 View  01/31/2011  *RADIOLOGY REPORT*  Clinical Data: Left-sided abdominal pain; constipation.  ABDOMEN - 1 VIEW  Comparison: Abdominal radiograph and CT of the abdomen and pelvis performed 01/15/2011  Findings: The visualized bowel gas pattern is unremarkable.  Stool is noted filling the cecum and ascending colon, and also within the sigmoid colon; no abnormal dilatation of small bowel loops is seen to suggest small bowel obstruction.  No free intra-abdominal air is identified,  though evaluation for free air is limited on a single supine view.  The visualized osseous structures are within normal limits; the sacroiliac joints are unremarkable in appearance.  Diffuse vascular calcifications are noted.  IMPRESSION:  1.  Stool noted filling the cecum and ascending colon, without evidence of significant constipation.  No free intra-abdominal air seen. 2.  Diffuse vascular calcifications noted.  Original Report Authenticated By: Santa Lighter, M.D.   Ct Head Wo Contrast  01/31/2011  *RADIOLOGY REPORT*  Clinical Data: Weakness.  CT HEAD WITHOUT CONTRAST  Technique:  Contiguous axial images were obtained from the base of the skull through the vertex without contrast.  Comparison: Head CT scan 01/11/2011.  Findings: Again seen is cortical atrophy and chronic microvascular ischemic change.  No evidence of acute infarction, hemorrhage, mass lesion, mass effect, midline shift or abnormal extra-axial fluid collection.  Mucosal thickening left maxillary sinus is noted.  IMPRESSION: No acute finding.  Original Report Authenticated By: Arvid Right. Luther Parody, M.D.   Dg Chest Port 1 View  01/31/2011  *RADIOLOGY REPORT*  Clinical Data: Shortness of breath and cough.  PORTABLE CHEST - 1 VIEW  Comparison: Plain film chest 01/21/2011.  Findings: There is elevation of the left hemidiaphragm with some subsegmental atelectasis in the left base.  The right lung is clear.  There is cardiomegaly.  IMPRESSION: Cardiomegaly and subsegmental atelectasis left lung base.  Original Report Authenticated By: Arvid Right. Luther Parody, M.D.    I have reviewed the patient's current medications.  Assessment/Plan: Problem #1 renal failure chronic this moment seems to  be reaching end-stage. Her BUN and creatinine remains high and her he is GFR is very low. Except her loss of appetite at this moment patient doesn't seem to have any other uremic sign and symptoms. Problem #2 history of diabetes Problem #3 history of  hypertension her blood pressure seems to be controlled very well Problem #4 history of anemia and anemia of chronic disease. Patient her he's on Epogen as outpatient. Problem #5 history of diastolic dysfunction presently she doesn't have any sign of fluid overload. Problem #5 history of CVA Problem #7 history of bradycardia Problem #8 history of hyponatremia possibly secondary to intravascular volume deficient with hypotonic fluid replacment Plan: We will initiate hemodialysis and I have discussed with her.              Will dialyze her for 2 hours her tomorrow and the once the PPD is read as the patient is going to be discharged after dialysis we'll make arrangements for outpatient dialysis.    LOS: 2 days   Can Lucci S 02/02/2011,7:42 AM

## 2011-02-03 ENCOUNTER — Inpatient Hospital Stay (HOSPITAL_COMMUNITY): Payer: Medicare Other

## 2011-02-03 LAB — BASIC METABOLIC PANEL
Calcium: 8.5 mg/dL (ref 8.4–10.5)
Creatinine, Ser: 3.51 mg/dL — ABNORMAL HIGH (ref 0.50–1.10)
GFR calc non Af Amer: 11 mL/min — ABNORMAL LOW (ref >90)
Glucose, Bld: 159 mg/dL — ABNORMAL HIGH (ref 70–99)
Sodium: 132 mEq/L — ABNORMAL LOW (ref 135–145)

## 2011-02-03 LAB — CBC
MCH: 24.3 pg — ABNORMAL LOW (ref 26.0–34.0)
Platelets: 167 10*3/uL (ref 150–400)
RBC: 4.15 MIL/uL (ref 3.87–5.11)
RDW: 19 % — ABNORMAL HIGH (ref 11.5–15.5)

## 2011-02-03 LAB — GLUCOSE, CAPILLARY
Glucose-Capillary: 122 mg/dL — ABNORMAL HIGH (ref 70–99)
Glucose-Capillary: 171 mg/dL — ABNORMAL HIGH (ref 70–99)

## 2011-02-03 MED ORDER — EPOETIN ALFA 10000 UNIT/ML IJ SOLN
10000.0000 [IU] | INTRAMUSCULAR | Status: DC
  Administered 2011-02-03 – 2011-02-05 (×2): 10000 [IU] via INTRAVENOUS
  Filled 2011-02-03: qty 1

## 2011-02-03 NOTE — Progress Notes (Signed)
Dr. Theador Hawthorne in to see pt during dialysis. Advised of the access infiltrate. No additional orders

## 2011-02-03 NOTE — Progress Notes (Signed)
UR Chart Review Completed  

## 2011-02-03 NOTE — Progress Notes (Signed)
TB test read today, 0 mm induration.

## 2011-02-03 NOTE — Progress Notes (Signed)
BP of 157/73 and pulse of 58.  Dr. Maryland Pink called and made aware, MD stated it was ok to give all morning bp meds.

## 2011-02-03 NOTE — Progress Notes (Addendum)
MEREL LAFORTE  MRN: [id number]  DOB/AGE: 05-29-1925 78 y.o.  Primary Care Physician:[place], Lonell Grandchild, MD, MD  Admit date: 01/31/2011  Chief Complaint:  Chief Complaint  Patient presents with  . Weakness  . Hyperglycemia    S-Pt presented on  01/31/2011 with  Chief Complaint  Patient presents with  . Weakness  . Hyperglycemia  . Pt said she is scared about going to HD- After explaining to the process pt feels better Pt seen on Dialysis.Pt tolerating treatment well  Meds     . amLODipine  10 mg Oral Q0700  . aspirin EC  81 mg Oral Daily  . calcitRIOL  0.25 mcg Oral Q0700  . citalopram  20 mg Oral Q0700  . cloNIDine  0.1 mg Oral TID  . docusate sodium  100 mg Oral BID  . epoetin alfa  10,000 Units Intravenous Q T,Th,Sa-HD  . heparin  5,000 Units Subcutaneous Q8H  . hydrALAZINE  100 mg Oral Q8H  . insulin aspart  0-9 Units Subcutaneous TID WC  . insulin aspart protamine-insulin aspart  5-8 Units Subcutaneous BID WC  . isosorbide mononitrate  60 mg Oral Daily  . levothyroxine  25 mcg Oral QAC breakfast  . simvastatin  10 mg Oral q1800  . sodium chloride  3 mL Intravenous Q12H  . traZODone  25 mg Oral QHS  . DISCONTD: epoetin alfa  10,000 Units Intravenous 3 times weekly  . DISCONTD: guanFACINE  2 mg Oral QHS         [id number] from the symptoms mentioned above,there are no other symptoms referable to all systems reviewed.  Physical Exam: Vital signs in last 24 hours: Temp:  [97.4 F (36.3 C)-98.5 F (36.9 C)] 97.4 F (36.3 C) (01/23 0536) Pulse Rate:  [50-85] 58  (01/23 0539) Resp:  [18] 18  (01/23 0536) BP: (131-157)/(61-73) 157/73 mmHg (01/23 0539) SpO2:  [2 %-100 %] 2 % (01/23 0756) Weight change:  Last BM Date: 02/02/11  Intake/Output from previous day: 01/22 0701 - 01/23 0700 In: 1673 [P.O.:1220; I.V.:453] Out: -      Physical Exam: Gen.: Awake ,alert, Oriented.  Lungs clear to auscultation bilaterally without wheeze rhonchi or rales    Cardiovascular regular rate rhythm without murmurs gallops rubs  Abdomen obese soft nontender  Extremities no clubbing cyanosis or edema  Access Left AVF + Two needles in place- Had infiltration     Lab Results: CBC  Basename 02/03/11 0500 02/02/11 0614  WBC 5.6 6.3  HGB 10.1* 10.6*  HCT 30.8* 32.0*  PLT 167 173    BMET  Basename 02/03/11 0500 02/02/11 0614  NA 132* 129*  K 3.4* 3.6  CL 92* 89*  CO2 29 31  GLUCOSE 159* 170*  BUN 68* 68*  CREATININE 3.51* 3.67*  CALCIUM 8.5 8.4    MICRO No results found for this or any previous visit (from the past 240 hour(s)).    Lab Results  Component Value Date   PTH 648.4* 02/01/2011   CALCIUM 8.5 02/03/2011   PHOS 4.9* 02/02/2011          Impression: 1)Renal  CKD stage 4/5 ==> Now ESRD                                                            .  CKD since 2008- MOst likely before that               CKD secondary to DM                Progression of CKD -as expected for DM                Will initiate HD today.  2)HTN Target Organ damage  CKD CAD CVA  Medication- On Calcium Channel Blockers On Vasodilators- Hydralzine + Isosorbide On Central Acting Sympatholytics- Clonidine   3)Anemia HGb at goal (9--11)                 ON EPO during HD  4)CKD Mineral-Bone Disorder PTH elevated  Secondary Hyperparathyroidism  present  Phosphorus at goal.   5)DM-  6)FEN  Normokalemic Hyponatremic  7)Acid base Co2 at goal     Plan:  We have initiated HD today Will dialyze for 2 hrs only Will start epo during Hd Will follow bmet.      BHUTANI,MANPREET S 02/03/2011, 10:22 AM

## 2011-02-03 NOTE — Progress Notes (Signed)
Pt is having her first dialysis. Approximately 30 minutes into treatment, pt stated she needed to use the bedpan. During rolling to get pt. On the bedpan the arterial needle infiltrated. Moderate edema at the arterial end. Ice placed intermittently to reduce swelling. Will pass on to the floor RN when pt. Returns to the floor.

## 2011-02-03 NOTE — Progress Notes (Signed)
Subjective: No new complaints. Has agreed to placement Objective: Vital signs in last 24 hours: Filed Vitals:   02/03/11 1200 02/03/11 1230 02/03/11 1300 02/03/11 1335  BP: 108/46 127/52 128/54 131/43  Pulse: 46 48 50 54  Temp:    98.6 F (37 C)  TempSrc:    Oral  Resp: 18 18 18 18   Height:      Weight:    80.9 kg (178 lb 5.6 oz)  SpO2:    97%   Weight change:   Intake/Output Summary (Last 24 hours) at 02/03/11 1503 Last data filed at 02/03/11 1335  Gross per 24 hour  Intake    243 ml  Output      0 ml  Net    243 ml   Physical Exam: Unchanged from 02/02/2011  Lab Results: Basic Metabolic Panel:  Lab [id number] 0500 02/02/11 0614  NA 132* 129*  K 3.4* 3.6  CL 92* 89*  CO2 29 31  GLUCOSE 159* 170*  BUN 68* 68*  CREATININE 3.51* 3.67*  CALCIUM 8.5 8.4  MG -- --  PHOS -- 4.9*   Liver Function Tests:  Lab 02/01/11 1750 02/01/11 0154  AST -- 12  ALT 5 7  ALKPHOS -- 75  BILITOT -- 0.4  PROT -- 6.3  ALBUMIN -- 3.0*   No results found for this basename: LIPASE:2,AMYLASE:2 in the last 168 hours No results found for this basename: AMMONIA:2 in the last 168 hours CBC:  Lab 02/03/11 0500 02/02/11 0614 01/31/11 2034  WBC 5.6 6.3 --  NEUTROABS -- -- 4.8  HGB 10.1* 10.6* --  HCT 30.8* 32.0* --  MCV 74.2* 73.9* --  PLT 167 173 --   Cardiac Enzymes:  Lab 02/01/11 1750 02/01/11 0954 02/01/11 0154  CKTOTAL 72 86 87  CKMB 2.0 2.2 2.1  CKMBINDEX -- -- --  TROPONINI <0.30 <0.30 <0.30   BNP: No results found for this basename: PROBNP:3 in the last 168 hours D-Dimer: No results found for this basename: DDIMER:2 in the last 168 hours CBG:  Lab 02/03/11 0741 02/02/11 2233 02/02/11 1638 02/02/11 1120 02/02/11 0729 02/01/11 1647  GLUCAP 159* 175* 87 204* 173* 167*   Hemoglobin A1C:  Lab 02/01/11 0154  HGBA1C 6.6*   Fasting Lipid Panel: No results found for this basename: CHOL,HDL,LDLCALC,TRIG,CHOLHDL,LDLDIRECT in the last 168 hours Thyroid Function  Tests: No results found for this basename: TSH,T4TOTAL,FREET4,T3FREE,THYROIDAB in the last 168 hours Coagulation: No results found for this basename: LABPROT:4,INR:4 in the last 168 hours Anemia Panel: No results found for this basename: VITAMINB12,FOLATE,FERRITIN,TIBC,IRON,RETICCTPCT in the last 168 hours Urine Drug Screen: Drugs of Abuse  No results found for this basename: labopia,  cocainscrnur,  labbenz,  amphetmu,  thcu,  labbarb    Alcohol Level: No results found for this basename: ETH:2 in the last 168 hours Urinalysis: Essentially negative.  Micro Results: Recent Results (from the past 240 hour(s))  URINE CULTURE     Status: Normal   Collection Time   02/02/11  4:46 AM      Component Value Range Status Comment   Specimen Description URINE, CLEAN CATCH   Final    Special Requests NONE   Final    Setup Time [id number]   Final    Colony Count 75,000 COLONIES/ML   Final    Culture     Final    Value: Multiple bacterial morphotypes present, none predominant. Suggest appropriate recollection if clinically indicated.   Report Status 02/03/2011 FINAL   Final  Studies/Results: No results found.  Scheduled Meds:    . amLODipine  10 mg Oral Q0700  . aspirin EC  81 mg Oral Daily  . calcitRIOL  0.25 mcg Oral Q0700  . citalopram  20 mg Oral Q0700  . cloNIDine  0.1 mg Oral TID  . docusate sodium  100 mg Oral BID  . epoetin alfa  10,000 Units Intravenous 3 times weekly  . heparin  5,000 Units Subcutaneous Q8H  . hydrALAZINE  100 mg Oral Q8H  . insulin aspart  0-9 Units Subcutaneous TID WC  . insulin aspart protamine-insulin aspart  5-8 Units Subcutaneous BID WC  . isosorbide mononitrate  60 mg Oral Daily  . levothyroxine  25 mcg Oral QAC breakfast  . simvastatin  10 mg Oral q1800  . sodium chloride  3 mL Intravenous Q12H  . traZODone  25 mg Oral QHS  . DISCONTD: epoetin alfa  10,000 Units Intravenous Q T,Th,Sa-HD   Continuous Infusions:  PRN Meds:.acetaminophen,  albuterol, bisacodyl, ondansetron (ZOFRAN) IV, ondansetron Assessment/Plan: Principal Problem:  *Syncope Active Problems:  Benign hypertensive heart and kidney disease and CKD stage V  Bradycardia  ANEMIA OF RENAL FAILURE  HYPERTENSION  Chronic diastolic congestive heart failure  Hyponatremia  DIABETES MELLITUS, CONTROLLED, WITH COMPLICATIONS  HYPERLIPIDEMIA  Constipation  Hypokalemia  To skilled nursing facility when okay with nephrology.  LOS: 3 days   [name] L 02/03/2011, 3:03 PM

## 2011-02-03 NOTE — Progress Notes (Signed)
CSW spoke with pt's daughter as facilities we discussed yesterday are unable to take pt. Pt very tired today following dialysis.  Daughter requests pt to be assessed by [facility].  Per Shirlean Mylar at [facility], they can assess tomorrow morning and have female bed.  CSW also faxed information to [facility] to initiate outpatient referral and will follow up with appointment times.    [name]

## 2011-02-03 NOTE — Progress Notes (Signed)
Subjective:  No CP or SOB  Objective:  Temp:  [97.4 F (36.3 C)-98.7 F (37.1 C)] 98.7 F (37.1 C) (01/23 1135) Pulse Rate:  [46-63] 50  (01/23 1300) Resp:  [18] 18  (01/23 1300) BP: (104-157)/(46-73) 128/54 mmHg (01/23 1300) SpO2:  [2 %-100 %] 98 % (01/23 1135) Weight:  [80.9 kg (178 lb 5.6 oz)] 80.9 kg (178 lb 5.6 oz) (01/23 1135) Weight change:   Intake/Output from previous day: 01/22 0701 - 01/23 0700 In: 1673 [P.O.:1220; I.V.:453] Out: -   Intake/Output from this shift:    Physical Exam: General appearance: alert and cooperative Neck: no adenopathy, no carotid bruit, no JVD, supple, symmetrical, trachea midline and thyroid not enlarged, symmetric, no tenderness/mass/nodules Lungs: clear to auscultation bilaterally Heart: regular rate and rhythm, S1, S2 normal, no murmur, click, rub or gallop Extremities: extremities normal, atraumatic, no cyanosis or edema  Lab Results: Results for orders placed during the hospital encounter of 01/31/11 (from the past 48 hour(s))  GLUCOSE, CAPILLARY     Status: Abnormal   Collection Time   02/01/11  4:47 PM      Component Value Range Comment   Glucose-Capillary 167 (*) 70 - 99 (mg/dL)    Comment 1 Notify RN      Comment 2 Documented in Chart     CARDIAC PANEL(CRET KIN+CKTOT+MB+TROPI)     Status: Normal   Collection Time   02/01/11  5:50 PM      Component Value Range Comment   Total CK 72  7 - 177 (U/L)    CK, MB 2.0  0.3 - 4.0 (ng/mL)    Troponin I <0.30  <0.30 (ng/mL)    Relative Index RELATIVE INDEX IS INVALID  0.0 - 2.5    PTH, INTACT AND CALCIUM     Status: Abnormal   Collection Time   02/01/11  5:50 PM      Component Value Range Comment   PTH 648.4 (*) 14.0 - 72.0 (pg/mL)    Calcium, Total (PTH) 7.2 (*) 8.4 - 10.5 (mg/dL)   HEPATITIS B SURFACE ANTIGEN     Status: Normal   Collection Time   02/01/11  5:50 PM      Component Value Range Comment   Hepatitis B Surface Ag NEGATIVE  NEGATIVE    HEPATITIS B CORE ANTIBODY, IGM      Status: Normal   Collection Time   02/01/11  5:50 PM      Component Value Range Comment   Hep B C IgM NEGATIVE  NEGATIVE    ALT     Status: Normal   Collection Time   02/01/11  5:50 PM      Component Value Range Comment   ALT 5  0 - 35 (U/L)   HEPATITIS B SURFACE ANTIBODY     Status: Normal   Collection Time   02/01/11  5:50 PM      Component Value Range Comment   Hep B S Ab NEGATIVE  NEGATIVE    URINE CULTURE     Status: Normal   Collection Time   02/02/11  4:46 AM      Component Value Range Comment   Specimen Description URINE, CLEAN CATCH      Special Requests NONE      Setup Time [id number]      Colony Count 75,000 COLONIES/ML      Culture        Value: Multiple bacterial morphotypes present, none predominant. Suggest appropriate recollection if clinically indicated.  Report Status 02/03/2011 FINAL     BASIC METABOLIC PANEL     Status: Abnormal   Collection Time   02/02/11  6:14 AM      Component Value Range Comment   Sodium 129 (*) 135 - 145 (mEq/L)    Potassium 3.6  3.5 - 5.1 (mEq/L) DELTA CHECK NOTED   Chloride 89 (*) 96 - 112 (mEq/L)    CO2 31  19 - 32 (mEq/L)    Glucose, Bld 170 (*) 70 - 99 (mg/dL)    BUN 68 (*) 6 - 23 (mg/dL)    Creatinine, Ser 3.67 (*) 0.50 - 1.10 (mg/dL)    Calcium 8.4  8.4 - 10.5 (mg/dL)    GFR calc non Af Amer 11 (*) >90 (mL/min)    GFR calc Af Amer 12 (*) >90 (mL/min)   CBC     Status: Abnormal   Collection Time   02/02/11  6:14 AM      Component Value Range Comment   WBC 6.3  4.0 - 10.5 (K/uL)    RBC 4.33  3.87 - 5.11 (MIL/uL)    Hemoglobin 10.6 (*) 12.0 - 15.0 (g/dL)    HCT 32.0 (*) 36.0 - 46.0 (%)    MCV 73.9 (*) 78.0 - 100.0 (fL)    MCH 24.5 (*) 26.0 - 34.0 (pg)    MCHC 33.1  30.0 - 36.0 (g/dL)    RDW 18.6 (*) 11.5 - 15.5 (%)    Platelets 173  150 - 400 (K/uL)   PHOSPHORUS     Status: Abnormal   Collection Time   02/02/11  6:14 AM      Component Value Range Comment   Phosphorus 4.9 (*) 2.3 - 4.6 (mg/dL)   GLUCOSE, CAPILLARY      Status: Abnormal   Collection Time   02/02/11  7:29 AM      Component Value Range Comment   Glucose-Capillary 173 (*) 70 - 99 (mg/dL)    Comment 1 Notify RN      Comment 2 Documented in Chart     GLUCOSE, CAPILLARY     Status: Abnormal   Collection Time   02/02/11 11:20 AM      Component Value Range Comment   Glucose-Capillary 204 (*) 70 - 99 (mg/dL)    Comment 1 Notify RN      Comment 2 Documented in Chart     GLUCOSE, CAPILLARY     Status: Normal   Collection Time   02/02/11  4:38 PM      Component Value Range Comment   Glucose-Capillary 87  70 - 99 (mg/dL)    Comment 1 Notify RN      Comment 2 Documented in Chart     GLUCOSE, CAPILLARY     Status: Abnormal   Collection Time   02/02/11 10:33 PM      Component Value Range Comment   Glucose-Capillary 175 (*) 70 - 99 (mg/dL)    Comment 1 Notify RN     BASIC METABOLIC PANEL     Status: Abnormal   Collection Time   02/03/11  5:00 AM      Component Value Range Comment   Sodium 132 (*) 135 - 145 (mEq/L)    Potassium 3.4 (*) 3.5 - 5.1 (mEq/L)    Chloride 92 (*) 96 - 112 (mEq/L)    CO2 29  19 - 32 (mEq/L)    Glucose, Bld 159 (*) 70 - 99 (mg/dL)    BUN 68 (*) 6 -  23 (mg/dL)    Creatinine, Ser 3.51 (*) 0.50 - 1.10 (mg/dL)    Calcium 8.5  8.4 - 10.5 (mg/dL)    GFR calc non Af Amer 11 (*) >90 (mL/min)    GFR calc Af Amer 13 (*) >90 (mL/min)   CBC     Status: Abnormal   Collection Time   02/03/11  5:00 AM      Component Value Range Comment   WBC 5.6  4.0 - 10.5 (K/uL)    RBC 4.15  3.87 - 5.11 (MIL/uL)    Hemoglobin 10.1 (*) 12.0 - 15.0 (g/dL)    HCT 30.8 (*) 36.0 - 46.0 (%)    MCV 74.2 (*) 78.0 - 100.0 (fL)    MCH 24.3 (*) 26.0 - 34.0 (pg)    MCHC 32.8  30.0 - 36.0 (g/dL)    RDW 19.0 (*) 11.5 - 15.5 (%)    Platelets 167  150 - 400 (K/uL)   GLUCOSE, CAPILLARY     Status: Abnormal   Collection Time   02/03/11  7:41 AM      Component Value Range Comment   Glucose-Capillary 159 (*) 70 - 99 (mg/dL)     Imaging: Imaging  results have been reviewed  Assessment/Plan:   1. Principal Problem: 2.  *Syncope 3. Active Problems: 4.  DIABETES MELLITUS, CONTROLLED, WITH COMPLICATIONS 5.  HYPERLIPIDEMIA 6.  ANEMIA OF RENAL FAILURE 7.  HYPERTENSION 8.  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  Constipation 10.  Chronic diastolic congestive heart failure 11.  Hyponatremia 12.  Bradycardia 13.  Hypokalemia 14.   Time Spent Directly with Patient:  20 minutes  Length of Stay:  LOS: 3 days  Pt began HD today. BPs well controlled on HD. Meds adjusted. Exam benign. Pt aparently is going to a SNF. Her Son was present during the exam. Hopefully with D/Cing Tenex and decreasing her clonidine her HR will increase.   [name] J 02/03/2011, 1:50 PM

## 2011-02-04 ENCOUNTER — Ambulatory Visit (HOSPITAL_COMMUNITY): Payer: Medicare Other

## 2011-02-04 LAB — BASIC METABOLIC PANEL
BUN: 47 mg/dL — ABNORMAL HIGH (ref 6–23)
CO2: 32 mEq/L (ref 19–32)
Calcium: 8.7 mg/dL (ref 8.4–10.5)
Creatinine, Ser: 2.98 mg/dL — ABNORMAL HIGH (ref 0.50–1.10)
Glucose, Bld: 140 mg/dL — ABNORMAL HIGH (ref 70–99)

## 2011-02-04 LAB — GLUCOSE, CAPILLARY
Glucose-Capillary: 189 mg/dL — ABNORMAL HIGH (ref 70–99)
Glucose-Capillary: 150 mg/dL — ABNORMAL HIGH (ref 70–99)

## 2011-02-04 MED ORDER — HEPARIN SODIUM (PORCINE) 1000 UNIT/ML DIALYSIS
500.0000 [IU] | INTRAMUSCULAR | Status: DC | PRN
  Filled 2011-02-04: qty 1

## 2011-02-04 MED ORDER — HEPARIN SODIUM (PORCINE) 1000 UNIT/ML DIALYSIS
20.0000 [IU]/kg | INTRAMUSCULAR | Status: DC | PRN
  Filled 2011-02-04: qty 2

## 2011-02-04 NOTE — Progress Notes (Signed)
Pt evaluated by Alena Bills today.  Facility to notify CSW when approved by administration for admission.  Pt's dialysis is set up at [facility] in [place] to begin Tuesday at 9:00.  CSW spoke with pt's daughter and requested that she go to DSS to complete necessary Medicaid paperwork today in order for pt to d/c tomorrow.  Daughter states she is unsure is she can get there today.  CSW notified her that pt should be d/c tomorrow after dialysis and family may have to take pt home for several days until Medicaid is sorted out prior to being admitted to ALF.  CSW to follow up tomorrow.  [employer] is okay with [facility] dialysis, but may change to [facility] once pt is adjusted.  Salome Arnt

## 2011-02-04 NOTE — Progress Notes (Signed)
Subjective: Interval History: has no complaint of nausea or vomiting. She denies also any dizziness or lightheadedness. She says that she's feeling better..  Objective: Vital signs in last 24 hours: Temp:  [97.9 F (36.6 C)-98.7 F (37.1 C)] 98.3 F (36.8 C) (01/24 0532) Pulse Rate:  [46-87] 86  (01/24 0532) Resp:  [18-20] 20  (01/24 0532) BP: (103-158)/(43-66) 158/64 mmHg (01/24 0532) SpO2:  [95 %-100 %] 97 % (01/24 0532) Weight:  [80.9 kg (178 lb 5.6 oz)] 80.9 kg (178 lb 5.6 oz) (01/23 1335) Weight change:   Intake/Output from previous day:   Intake/Output this shift:   examination patient is alert in no apparent distress. Chest is clear to auscultation no rales no rhonchi no egophony Heart exam revealed regular rate and rhythm no murmur S3 Abdomen soft positive bowel sounds Extremities she doesn't have any edema.    Lab Results:  Basename 02/03/11 0500 02/02/11 0614  WBC 5.6 6.3  HGB 10.1* 10.6*  HCT 30.8* 32.0*  PLT 167 173   BMET:  Basename 02/04/11 0502 02/03/11 0500  NA 136 132*  K 3.7 3.4*  CL 95* 92*  CO2 32 29  GLUCOSE 140* 159*  BUN 47* 68*  CREATININE 2.98* 3.51*  CALCIUM 8.7 8.5    Basename 02/01/11 1750  PTH 648.4*   Iron Studies: No results found for this basename: IRON,TIBC,TRANSFERRIN,FERRITIN in the last 72 hours  Studies/Results: No results found.  I have reviewed the patient's current medications.  Assessment/Plan: Problem #1 end-stage renal disease she status post hemodialysis yesterday her BUN is 47 creatinine is 2.96 seems to be better. Patient doesn't have any nausea vomiting. Her potassium is 3.7. Problem #2 anemia this is secondary to chronic renal failure she is on Epogen Problem #3 history of diabetes Problem #4 history of hypertension her blood pressure seems to be controlled very well Problem #5 secondary hyperparathyroidism with PTH of 648. Problem #6 history of CVA Problem #7 history of bradycardia. Plan: Patient is for  hemodialysis tomorrow if her patient is going to be discharged will make arrangement for outpatient dialysis after tomorrow. We'll continue his present management We'll start her on hectrol after each dialysis possibly as an outpatient. We'll check her CBC, basic metabolic panel and phosphorus in the morning.    LOS: 4 days   [name] S 02/04/2011,8:07 AM

## 2011-02-04 NOTE — Progress Notes (Signed)
[facility]  DAILY PROGRESS NOTE   Subjective:  No new events overnight. HR seems improved. Plan for dialysis and nursing facility placement.  Objective:  Temp:  [97.9 F (36.6 C)-98.7 F (37.1 C)] 98.3 F (36.8 C) (01/24 0532) Pulse Rate:  [54-87] 86  (01/24 0532) Resp:  [18-20] 20  (01/24 0532) BP: (103-158)/(43-66) 158/64 mmHg (01/24 0532) SpO2:  [95 %-100 %] 97 % (01/24 0814) Weight:  [80.9 kg (178 lb 5.6 oz)] 80.9 kg (178 lb 5.6 oz) (01/23 1335) Weight change:   Intake/Output from previous day:    Intake/Output from this shift:    Medications: Current Facility-Administered Medications  Medication Dose Route Frequency Provider Last Rate Last Dose  . acetaminophen (TYLENOL) tablet 650 mg  650 mg Oral Q6H PRN Bonnielee Haff, MD      . albuterol (PROVENTIL) (5 MG/ML) 0.5% nebulizer solution 2.5 mg  2.5 mg Nebulization Q2H PRN Bonnielee Haff, MD      . amLODipine (NORVASC) tablet 10 mg  10 mg Oral Q0700 Bonnielee Haff, MD   5 mg at 02/04/11 1057  . aspirin EC tablet 81 mg  81 mg Oral Daily Bonnielee Haff, MD   81 mg at 02/04/11 1058  . bisacodyl (DULCOLAX) EC tablet 5 mg  5 mg Oral Daily PRN Bonnielee Haff, MD   5 mg at 02/03/11 0907  . calcitRIOL (ROCALTROL) capsule 0.25 mcg  0.25 mcg Oral Q0700 Bonnielee Haff, MD   0.25 mcg at 02/04/11 1058  . citalopram (CELEXA) tablet 20 mg  20 mg Oral Q0700 Bonnielee Haff, MD   20 mg at 02/04/11 1057  . cloNIDine (CATAPRES) tablet 0.1 mg  0.1 mg Oral TID Rexene Alberts, MD   0.1 mg at 02/03/11 2215  . docusate sodium (COLACE) capsule 100 mg  100 mg Oral BID Bonnielee Haff, MD   100 mg at 02/04/11 1057  . epoetin alfa (EPOGEN,PROCRIT) injection 10,000 Units  10,000 Units Intravenous 3 times weekly Liana Gerold, MD   10,000 Units at 02/03/11 1220  . heparin injection 1,600 Units  20 Units/kg Dialysis PRN Harriett Sine, MD      . heparin injection 5,000 Units  5,000 Units Subcutaneous Q8H Bonnielee Haff, MD   5,000 Units at 02/04/11 0625  . heparin injection 500 Units  500 Units Dialysis PRN Harriett Sine, MD      . hydrALAZINE (APRESOLINE) tablet 100 mg  100 mg Oral Q8H Bonnielee Haff, MD   100 mg at 02/03/11 2214  . insulin aspart (novoLOG) injection 0-9 Units  0-9 Units Subcutaneous TID WC Bonnielee Haff, MD   2 Units at 02/04/11 1106  . isosorbide mononitrate (IMDUR) 24 hr tablet 60 mg  60 mg Oral Daily Bonnielee Haff, MD   60 mg at 02/04/11 1057  . levothyroxine (SYNTHROID, LEVOTHROID) tablet 25 mcg  25 mcg Oral QAC breakfast Bonnielee Haff, MD   25 mcg at 02/04/11 [phone]  . ondansetron (ZOFRAN) tablet 4 mg  4 mg Oral Q6H PRN Bonnielee Haff, MD       Or  . ondansetron (ZOFRAN) injection 4 mg  4 mg Intravenous Q6H PRN Bonnielee Haff, MD      . simvastatin (ZOCOR) tablet 10 mg  10 mg Oral q1800 Bonnielee Haff, MD   10 mg at 02/03/11 1748  . sodium chloride 0.9 % injection 3 mL  3 mL Intravenous Q12H Bonnielee Haff, MD   3 mL at 02/03/11 2215  . traZODone (DESYREL) tablet  25 mg  25 mg Oral QHS Delfina Redwood, MD   25 mg at 02/03/11 2214  . DISCONTD: insulin aspart protamine-insulin aspart (NOVOLOG 70/30) injection 5-8 Units  5-8 Units Subcutaneous BID WC Bonnielee Haff, MD   5 Units at 02/03/11 [id number]    Physical Exam: General appearance: alert and no distress Neck: no adenopathy, no carotid bruit, no JVD, supple, symmetrical, trachea midline and thyroid not enlarged, symmetric, no tenderness/mass/nodules Lungs: clear to auscultation bilaterally Heart: regular rate and rhythm, S1, S2 normal, no murmur, click, rub or gallop Abdomen: soft, non-tender; bowel sounds normal; no masses,  no organomegaly Extremities: edema trace Neurologic: Grossly normal  Lab Results: Results for orders placed during the hospital encounter of 01/31/11 (from the past 48 hour(s))  GLUCOSE, CAPILLARY     Status: Normal   Collection Time   02/02/11  4:38 PM      Component Value Range Comment    Glucose-Capillary 87  70 - 99 (mg/dL)    Comment 1 Notify RN      Comment 2 Documented in Chart     GLUCOSE, CAPILLARY     Status: Abnormal   Collection Time   02/02/11 10:33 PM      Component Value Range Comment   Glucose-Capillary 175 (*) 70 - 99 (mg/dL)    Comment 1 Notify RN     BASIC METABOLIC PANEL     Status: Abnormal   Collection Time   02/03/11  5:00 AM      Component Value Range Comment   Sodium 132 (*) 135 - 145 (mEq/L)    Potassium 3.4 (*) 3.5 - 5.1 (mEq/L)    Chloride 92 (*) 96 - 112 (mEq/L)    CO2 29  19 - 32 (mEq/L)    Glucose, Bld 159 (*) 70 - 99 (mg/dL)    BUN 68 (*) 6 - 23 (mg/dL)    Creatinine, Ser 3.51 (*) 0.50 - 1.10 (mg/dL)    Calcium 8.5  8.4 - 10.5 (mg/dL)    GFR calc non Af Amer 11 (*) >90 (mL/min)    GFR calc Af Amer 13 (*) >90 (mL/min)   CBC     Status: Abnormal   Collection Time   02/03/11  5:00 AM      Component Value Range Comment   WBC 5.6  4.0 - 10.5 (K/uL)    RBC 4.15  3.87 - 5.11 (MIL/uL)    Hemoglobin 10.1 (*) 12.0 - 15.0 (g/dL)    HCT 30.8 (*) 36.0 - 46.0 (%)    MCV 74.2 (*) 78.0 - 100.0 (fL)    MCH 24.3 (*) 26.0 - 34.0 (pg)    MCHC 32.8  30.0 - 36.0 (g/dL)    RDW 19.0 (*) 11.5 - 15.5 (%)    Platelets 167  150 - 400 (K/uL)   GLUCOSE, CAPILLARY     Status: Abnormal   Collection Time   02/03/11  7:41 AM      Component Value Range Comment   Glucose-Capillary 159 (*) 70 - 99 (mg/dL)   GLUCOSE, CAPILLARY     Status: Abnormal   Collection Time   02/03/11  4:49 PM      Component Value Range Comment   Glucose-Capillary 122 (*) 70 - 99 (mg/dL)   GLUCOSE, CAPILLARY     Status: Abnormal   Collection Time   02/03/11  9:06 PM      Component Value Range Comment   Glucose-Capillary 171 (*) 70 - 99 (mg/dL)  BASIC METABOLIC PANEL     Status: Abnormal   Collection Time   02/04/11  5:02 AM      Component Value Range Comment   Sodium 136  135 - 145 (mEq/L)    Potassium 3.7  3.5 - 5.1 (mEq/L)    Chloride 95 (*) 96 - 112 (mEq/L)    CO2 32  19 - 32  (mEq/L)    Glucose, Bld 140 (*) 70 - 99 (mg/dL)    BUN 47 (*) 6 - 23 (mg/dL)    Creatinine, Ser 2.98 (*) 0.50 - 1.10 (mg/dL)    Calcium 8.7  8.4 - 10.5 (mg/dL)    GFR calc non Af Amer 14 (*) >90 (mL/min)    GFR calc Af Amer 16 (*) >90 (mL/min)   GLUCOSE, CAPILLARY     Status: Abnormal   Collection Time   02/04/11  7:27 AM      Component Value Range Comment   Glucose-Capillary 142 (*) 70 - 99 (mg/dL)   GLUCOSE, CAPILLARY     Status: Abnormal   Collection Time   02/04/11 11:00 AM      Component Value Range Comment   Glucose-Capillary 189 (*) 70 - 99 (mg/dL)     Imaging: No results found.  Assessment:  1. Principal Problem: 2.  *Syncope 3. Active Problems: 4.  DIABETES MELLITUS, CONTROLLED, WITH COMPLICATIONS 5.  HYPERLIPIDEMIA 6.  ANEMIA OF RENAL FAILURE 7.  HYPERTENSION 8.  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  Constipation 10.  Chronic diastolic congestive heart failure 11.  Hyponatremia 12.  Bradycardia 13.  Hypokalemia 14.   Plan:  1. HR and blood pressure improved. Agree with plan of nursing facility placement. She did not remember me today, despite the fact I had seen her several times recently in the office. There may certainly be an element of delerium or dementia. She is now off of b-blocker and clonidine. She may be a pacemaker candidate in the future if she has tachyarrhythmias or recurrent episodes of symptomatic bradycardia, but she is not at this time. 2. Would like to see her in follow-up in the office in 3-4 weeks.  Time Spent Directly with Patient:  15 minutes  Length of Stay:  LOS: 4 days   Pixie Casino, MD Attending Cardiologist [facility] 02/04/2011, 1:16 PM

## 2011-02-04 NOTE — Progress Notes (Signed)
Subjective: No new complaints.  Objective: Vital signs in last 24 hours: Filed Vitals:   02/03/11 1430 02/03/11 2145 02/04/11 0532 02/04/11 0814  BP: 103/58 131/66 158/64   Pulse: 55 87 86   Temp: 97.9 F (36.6 C) 98.7 F (37.1 C) 98.3 F (36.8 C)   TempSrc:  Oral Oral   Resp: 18 18 20    Height:      Weight:      SpO2: 100% 95% 97% 97%   Weight change:   Intake/Output Summary (Last 24 hours) at 02/04/11 1406 Last data filed at 02/04/11 1000  Gross per 24 hour  Intake      0 ml  Output      0 ml  Net      0 ml   Physical Exam: Unchanged from 02/03/2011  Lab Results: Basic Metabolic Panel:  Lab [id number] 02/03/11 0500 02/02/11 0614  NA 136 132* --  K 3.7 3.4* --  CL 95* 92* --  CO2 32 29 --  GLUCOSE 140* 159* --  BUN 47* 68* --  CREATININE 2.98* 3.51* --  CALCIUM 8.7 8.5 --  MG -- -- --  PHOS -- -- 4.9*   Liver Function Tests:  Lab 02/01/11 1750 02/01/11 0154  AST -- 12  ALT 5 7  ALKPHOS -- 75  BILITOT -- 0.4  PROT -- 6.3  ALBUMIN -- 3.0*   No results found for this basename: LIPASE:2,AMYLASE:2 in the last 168 hours No results found for this basename: AMMONIA:2 in the last 168 hours CBC:  Lab 02/03/11 0500 02/02/11 0614 01/31/11 2034  WBC 5.6 6.3 --  NEUTROABS -- -- 4.8  HGB 10.1* 10.6* --  HCT 30.8* 32.0* --  MCV 74.2* 73.9* --  PLT 167 173 --   Cardiac Enzymes:  Lab 02/01/11 1750 02/01/11 0954 02/01/11 0154  CKTOTAL 72 86 87  CKMB 2.0 2.2 2.1  CKMBINDEX -- -- --  TROPONINI <0.30 <0.30 <0.30   BNP: No results found for this basename: PROBNP:3 in the last 168 hours D-Dimer: No results found for this basename: DDIMER:2 in the last 168 hours CBG:  Lab 02/04/11 1100 02/04/11 0727 02/03/11 2106 02/03/11 1649 02/03/11 0741 02/02/11 2233  GLUCAP 189* 142* 171* 122* 159* 175*   Hemoglobin A1C:  Lab 02/01/11 0154  HGBA1C 6.6*   Fasting Lipid Panel: No results found for this basename: CHOL,HDL,LDLCALC,TRIG,CHOLHDL,LDLDIRECT in the  last 168 hours Thyroid Function Tests: No results found for this basename: TSH,T4TOTAL,FREET4,T3FREE,THYROIDAB in the last 168 hours Coagulation: No results found for this basename: LABPROT:4,INR:4 in the last 168 hours Anemia Panel: No results found for this basename: VITAMINB12,FOLATE,FERRITIN,TIBC,IRON,RETICCTPCT in the last 168 hours Urine Drug Screen: Drugs of Abuse  No results found for this basename: labopia,  cocainscrnur,  labbenz,  amphetmu,  thcu,  labbarb    Alcohol Level: No results found for this basename: ETH:2 in the last 168 hours Urinalysis: Essentially negative.  Micro Results: Recent Results (from the past 240 hour(s))  URINE CULTURE     Status: Normal   Collection Time   02/02/11  4:46 AM      Component Value Range Status Comment   Specimen Description URINE, CLEAN CATCH   Final    Special Requests NONE   Final    Setup Time [id number]   Final    Colony Count 75,000 COLONIES/ML   Final    Culture     Final    Value: Multiple bacterial morphotypes present, none predominant. Suggest  appropriate recollection if clinically indicated.   Report Status 02/03/2011 FINAL   Final    Studies/Results: No results found.  Scheduled Meds:    . amLODipine  10 mg Oral Q0700  . aspirin EC  81 mg Oral Daily  . calcitRIOL  0.25 mcg Oral Q0700  . citalopram  20 mg Oral Q0700  . cloNIDine  0.1 mg Oral TID  . docusate sodium  100 mg Oral BID  . epoetin alfa  10,000 Units Intravenous 3 times weekly  . heparin  5,000 Units Subcutaneous Q8H  . hydrALAZINE  100 mg Oral Q8H  . insulin aspart  0-9 Units Subcutaneous TID WC  . isosorbide mononitrate  60 mg Oral Daily  . levothyroxine  25 mcg Oral QAC breakfast  . simvastatin  10 mg Oral q1800  . sodium chloride  3 mL Intravenous Q12H  . traZODone  25 mg Oral QHS  . 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   LOS: 4 days   [name] L 02/04/2011, 2:06 PM

## 2011-02-05 ENCOUNTER — Ambulatory Visit: Payer: Medicare Other | Admitting: Family Medicine

## 2011-02-05 ENCOUNTER — Inpatient Hospital Stay (HOSPITAL_COMMUNITY): Payer: Medicare HMO

## 2011-02-05 LAB — BASIC METABOLIC PANEL
CO2: 30 mEq/L (ref 19–32)
Chloride: 96 mEq/L (ref 96–112)
GFR calc Af Amer: 14 mL/min — ABNORMAL LOW (ref >90)
Potassium: 3.8 mEq/L (ref 3.5–5.1)
Sodium: 135 mEq/L (ref 135–145)

## 2011-02-05 LAB — CBC
HCT: 32.5 % — ABNORMAL LOW (ref 36.0–46.0)
Hemoglobin: 10.4 g/dL — ABNORMAL LOW (ref 12.0–15.0)
MCV: 75.9 fL — ABNORMAL LOW (ref 78.0–100.0)
RBC: 4.28 MIL/uL (ref 3.87–5.11)
WBC: 7.7 10*3/uL (ref 4.0–10.5)

## 2011-02-05 LAB — GLUCOSE, CAPILLARY: Glucose-Capillary: 148 mg/dL — ABNORMAL HIGH (ref 70–99)

## 2011-02-05 MED ORDER — SIMVASTATIN 10 MG PO TABS: 10.0000 mg | ORAL_TABLET | Freq: Every day | ORAL | Status: DC

## 2011-02-05 MED ORDER — AMLODIPINE BESYLATE 10 MG PO TABS: 10.0000 mg | ORAL_TABLET | ORAL | Status: DC

## 2011-02-05 MED ORDER — INSULIN ASPART PROT & ASPART (70-30 MIX) 100 UNIT/ML ~~LOC~~ SUSP: 5.0000 [IU] | Freq: Two times a day (BID) | SUBCUTANEOUS | Status: DC

## 2011-02-05 MED ORDER — CITALOPRAM HYDROBROMIDE 20 MG PO TABS: 20.0000 mg | ORAL_TABLET | ORAL | Status: DC

## 2011-02-05 MED ORDER — LEVOTHYROXINE SODIUM 25 MCG PO TABS: 25.0000 ug | ORAL_TABLET | Freq: Every day | ORAL | Status: DC

## 2011-02-05 MED ORDER — HYDRALAZINE HCL 100 MG PO TABS: 100.0000 mg | ORAL_TABLET | Freq: Three times a day (TID) | ORAL | Status: DC

## 2011-02-05 MED ORDER — CALCITRIOL 0.25 MCG PO CAPS: 0.2500 ug | ORAL_CAPSULE | ORAL | Status: DC

## 2011-02-05 MED ORDER — POLYSACCHARIDE IRON COMPLEX 150 MG PO CAPS: 150.0000 mg | ORAL_CAPSULE | ORAL | Status: DC

## 2011-02-05 MED ORDER — ALBUTEROL SULFATE HFA 108 (90 BASE) MCG/ACT IN AERS: 2.0000 | INHALATION_SPRAY | RESPIRATORY_TRACT | Status: DC | PRN

## 2011-02-05 MED ORDER — ISOSORBIDE MONONITRATE ER 60 MG PO TB24: 60.0000 mg | ORAL_TABLET | Freq: Every day | ORAL | Status: DC

## 2011-02-05 NOTE — Progress Notes (Signed)
Subjective: Interval History: has no complaint of nausea or vomiting. Patient is still seems to be weak. She denies any difficulty breathing and also no orthopnea. Her appetite is getting better..  Objective: Vital signs in last 24 hours: Temp:  [97.9 F (36.6 C)-98.6 F (37 C)] 97.9 F (36.6 C) (01/25 0624) Pulse Rate:  [51-62] 53  (01/25 0624) Resp:  [18-20] 20  (01/25 0624) BP: (134-144)/(51-61) 143/51 mmHg (01/25 0624) SpO2:  [97 %-99 %] 98 % (01/25 0624) Weight change:   Intake/Output from previous day:   Intake/Output this shift:    General appearance: alert, cooperative and no distress Resp: clear to auscultation bilaterally Cardio: regular rate and rhythm, S1, S2 normal, no murmur, click, rub or gallop GI: soft, non-tender; bowel sounds normal; no masses,  no organomegaly Extremities: extremities normal, atraumatic, no cyanosis or edema  Lab Results:  Basename 02/05/11 0454 02/03/11 0500  WBC 7.7 5.6  HGB 10.4* 10.1*  HCT 32.5* 30.8*  PLT 168 167   BMET:  Basename 02/05/11 0454 02/04/11 0502  NA 135 136  K 3.8 3.7  CL 96 95*  CO2 30 32  GLUCOSE 155* 140*  BUN 55* 47*  CREATININE 3.26* 2.98*  CALCIUM 9.1 8.7   No results found for this basename: PTH:2 in the last 72 hours Iron Studies: No results found for this basename: IRON,TIBC,TRANSFERRIN,FERRITIN in the last 72 hours  Studies/Results: No results found.  I have reviewed the patient's current medications.  Assessment/Plan: Problem #1 end-stage renal disease she status post hemodialysis the day before yesterday her BUN is 55 creatinine 3.26 still seems to be increasing. At this moment patient doesn't have any nausea vomiting. Her potassium is 3.8. Problem #2 history of anemia she is on Epogen her H&H is 10.4 and 32.5 stable Problem #3 history of hypertension blood pressure seems to be reasonably controlled Problem #4 history of diabetes Problem #5 history of CVA Problem #6 history of CHF patient  doesn't have any sign of fluid overload. Problem #7 history of diabetes Plan: We'll make arrangements for patient to get dialysis today           We'll continue his other medications as before           Once the patient is discharged will make arrangements for outpatient dialysis.             We'll check her CBC, basic metabolic panel and phosphorus in the morning.     LOS: 5 days   Tequan Redmon S 02/05/2011,8:19 AM

## 2011-02-05 NOTE — Progress Notes (Signed)
Notified the pts daughter Vidal Schwalbe that the patient did not have any clothes to be discharged at the ALF.  She stated that she had been running all day trying to get things together at the facility and had not had time to make it here.  She also stated that it would be okay to send the patient in a gown wrapped up in cover, and that she has clothes at the site.  I had some concerns about the patient traveling to the facility without O2 and the since according to report the pt is on O2 at home.  The transporter from the site stated that the family said that the family said they could not get in the home to get her O2 and that some was at the facility.  The patient was checked on 02 with being on room air for approx. 10 mins and after being placed in the chair with her assistance and her O2 remained 96-98% room air.   Discharge instructions  were discussed with the transporter for the unit.  Voiced to her that if once at the facility and if there is questions she could call back to the floor.  She verbalized understanding.  The patient was transferred off the unit via w/c with staff and the transporter and assisted in the [place].  The patient was in stable condition at this time.

## 2011-02-05 NOTE — Progress Notes (Addendum)
Pt d/c today by MD to [facility].  [name] upset with CSW earlier as it appeared pt may have to return home before transferring to ALF.  Per Shirlean Mylar at facility, pt has been approved and Medicaid application complete for ALF.  Pt and pt's daughter aware and agreeable.  Pt to transport via facility van.  Christinia Gully, RN, reviewed [facility] for accuracy.  D/C summary faxed to facility.  [name] appeared to be okay with d/c as she can now go to ALF.  Pt's dialysis changed to [facility] by request of ALF and will be MWF beginning Monday at 9:30.  [employer] and pt's daughter aware.    Salome Arnt

## 2011-02-05 NOTE — Discharge Summary (Addendum)
Physician Discharge Summary  Patient ID: [name] MRN: [id number] DOB/AGE: [birth year] 78 y.o.  Admit date: 01/31/2011 Discharge date: 02/05/2011  Discharge Diagnoses:  Principal Problem:  *Syncope Active Problems:   Bradycardia  ANEMIA OF RENAL FAILURE  HYPERTENSION  Chronic diastolic congestive heart failure  Hyponatremia  DIABETES MELLITUS, CONTROLLED, WITH COMPLICATIONS  HYPERLIPIDEMIA  Constipation  Hypokalemia ESRD, started on dialysis during this hospitalization  Medication List  As of 02/05/2011 12:31 PM   STOP taking these medications         carvedilol 25 MG tablet      ciprofloxacin 250 MG tablet      cloNIDine 0.2 MG tablet      feeding supplement Liqd      FLONASE 50 MCG/ACT nasal spray      furosemide 80 MG tablet      metolazone 2.5 MG tablet      OPTIVE 0.5-0.9 % Soln      potassium chloride 10 MEQ tablet      TENEX 2 MG tablet         TAKE these medications         albuterol 108 (90 BASE) MCG/ACT inhaler   Commonly known as: PROVENTIL HFA;VENTOLIN HFA   Inhale 2 puffs into the lungs every 4 (four) hours as needed for wheezing or shortness of breath (cough).      amLODipine 10 MG tablet   Commonly known as: NORVASC   Take 1 tablet (10 mg total) by mouth every morning.      aspirin EC 81 MG tablet   Take 81 mg by mouth daily.      calcitRIOL 0.25 MCG capsule   Commonly known as: ROCALTROL   Take 1 capsule (0.25 mcg total) by mouth every morning.      citalopram 20 MG tablet   Commonly known as: CELEXA   Take 1 tablet (20 mg total) by mouth every morning.      hydrALAZINE 100 MG tablet   Commonly known as: APRESOLINE   Take 1 tablet (100 mg total) by mouth every 8 (eight) hours.      insulin aspart protamine-insulin aspart (70-30) 100 UNIT/ML injection   Commonly known as: NOVOLOG 70/30   Inject 5 Units into the skin 2 (two) times daily with a meal. Inject 8 units every morning and inject 5 units every evening      iron  polysaccharides 150 MG capsule   Commonly known as: NIFEREX   Take 1 capsule (150 mg total) by mouth every morning.      isosorbide mononitrate 60 MG 24 hr tablet   Commonly known as: IMDUR   Take 1 tablet (60 mg total) by mouth daily.      levothyroxine 25 MCG tablet   Commonly known as: SYNTHROID, LEVOTHROID   Take 1 tablet (25 mcg total) by mouth daily before breakfast.      simvastatin 10 MG tablet   Commonly known as: ZOCOR   Take 1 tablet (10 mg total) by mouth at bedtime.            Discharge Orders    Future Appointments: Provider: Department: Dept Phone: Center:   02/05/2011 1:30 PM [facility]  None   03/25/2011 10:45 AM Vic Blackbird, MD [facility] [phone] [facility]     Future Orders Please Complete By Expires   Diet - low sodium heart healthy      Diet Carb Modified      Walk  with assistance      Walker          Follow-up Information    Dr. Debara Pickett 3 -4 weeks         Disposition: ALF  Discharged Condition: stable  Consults: Treatment Team:  Troy Sine, MD Harriett Sine, MD  Labs:   Results for orders placed during the hospital encounter of 01/31/11 (from the past 48 hour(s))  GLUCOSE, CAPILLARY     Status: Abnormal   Collection Time   02/03/11  4:49 PM      Component Value Range Comment   Glucose-Capillary 122 (*) 70 - 99 (mg/dL)   GLUCOSE, CAPILLARY     Status: Abnormal   Collection Time   02/03/11  9:06 PM      Component Value Range Comment   Glucose-Capillary 171 (*) 70 - 99 (mg/dL)   BASIC METABOLIC PANEL     Status: Abnormal   Collection Time   02/04/11  5:02 AM      Component Value Range Comment   Sodium 136  135 - 145 (mEq/L)    Potassium 3.7  3.5 - 5.1 (mEq/L)    Chloride 95 (*) 96 - 112 (mEq/L)    CO2 32  19 - 32 (mEq/L)    Glucose, Bld 140 (*) 70 - 99 (mg/dL)    BUN 47 (*) 6 - 23 (mg/dL)    Creatinine, Ser 2.98 (*) 0.50 - 1.10 (mg/dL)    Calcium 8.7  8.4 - 10.5 (mg/dL)    GFR calc non  Af Amer 14 (*) >90 (mL/min)    GFR calc Af Amer 16 (*) >90 (mL/min)   GLUCOSE, CAPILLARY     Status: Abnormal   Collection Time   02/04/11  7:27 AM      Component Value Range Comment   Glucose-Capillary 142 (*) 70 - 99 (mg/dL)   GLUCOSE, CAPILLARY     Status: Abnormal   Collection Time   02/04/11 11:00 AM      Component Value Range Comment   Glucose-Capillary 189 (*) 70 - 99 (mg/dL)   GLUCOSE, CAPILLARY     Status: Abnormal   Collection Time   02/04/11  4:36 PM      Component Value Range Comment   Glucose-Capillary 150 (*) 70 - 99 (mg/dL)   GLUCOSE, CAPILLARY     Status: Abnormal   Collection Time   02/04/11  9:19 PM      Component Value Range Comment   Glucose-Capillary 205 (*) 70 - 99 (mg/dL)   PHOSPHORUS     Status: Normal   Collection Time   02/05/11  4:54 AM      Component Value Range Comment   Phosphorus 4.6  2.3 - 4.6 (mg/dL)   BASIC METABOLIC PANEL     Status: Abnormal   Collection Time   02/05/11  4:54 AM      Component Value Range Comment   Sodium 135  135 - 145 (mEq/L)    Potassium 3.8  3.5 - 5.1 (mEq/L)    Chloride 96  96 - 112 (mEq/L)    CO2 30  19 - 32 (mEq/L)    Glucose, Bld 155 (*) 70 - 99 (mg/dL)    BUN 55 (*) 6 - 23 (mg/dL)    Creatinine, Ser 3.26 (*) 0.50 - 1.10 (mg/dL)    Calcium 9.1  8.4 - 10.5 (mg/dL)    GFR calc non Af Amer 12 (*) >90 (mL/min)    GFR calc Af  Amer 14 (*) >90 (mL/min)   CBC     Status: Abnormal   Collection Time   02/05/11  4:54 AM      Component Value Range Comment   WBC 7.7  4.0 - 10.5 (K/uL)    RBC 4.28  3.87 - 5.11 (MIL/uL)    Hemoglobin 10.4 (*) 12.0 - 15.0 (g/dL)    HCT 32.5 (*) 36.0 - 46.0 (%)    MCV 75.9 (*) 78.0 - 100.0 (fL)    MCH 24.3 (*) 26.0 - 34.0 (pg)    MCHC 32.0  30.0 - 36.0 (g/dL)    RDW 18.7 (*) 11.5 - 15.5 (%)    Platelets 168  150 - 400 (K/uL)   GLUCOSE, CAPILLARY     Status: Abnormal   Collection Time   02/05/11  7:41 AM      Component Value Range Comment   Glucose-Capillary 148 (*) 70 - 99 (mg/dL)      Diagnostics:  Ct Abdomen Pelvis Wo Contrast  01/15/2011  *RADIOLOGY REPORT*  Clinical Data: Upper abdominal pain, gynecologic cancer status post hysterectomy  CT ABDOMEN AND PELVIS WITHOUT CONTRAST  Technique:  Multidetector CT imaging of the abdomen and pelvis was performed following the standard protocol without intravenous contrast.  Comparison: 11/01/2009  Findings: Small bilateral pleural effusions.  Associated lower lobe opacities, possibly atelectasis.  No focal hepatic lesions.  Mild left pneumobilia, correlate for prior sphincterotomy.  Unenhanced spleen, pancreas, and adrenal glands are within normal limits.  Distended gallbladder with layering small gallstones (series 2/image 38), similar to 2011.  No associated inflammatory changes by CT.  No intrahepatic or extrahepatic ductal dilatation.  Bilateral renal cortical atrophy.  No renal calculi or hydronephrosis.  No evidence of bowel obstruction.  No colonic wall thickening or inflammatory changes.  Atherosclerotic calcifications of the abdominal aorta and branch vessels.  No abdominopelvic ascites.  No suspicious abdominopelvic lymphadenopathy.  Status post hysterectomy.  No adnexal masses.  Bladder is within normal limits.  Degenerative changes of the visualized thoracolumbar spine.  IMPRESSION: Distended gallbladder with cholelithiasis, similar to 2011.  No associated inflammatory changes by CT.  No evidence of bowel obstruction.  Small bilateral pleural effusions with associated lower lobe opacities, possibly atelectasis.  Original Report Authenticated By: Julian Hy, M.D.   Dg Chest 1 View  01/12/2011  *RADIOLOGY REPORT*  Clinical Data: Shortness of breath and weakness.  Question congestive heart failure.  CHEST - 1 VIEW  Comparison: 01/09/2011 and 01/05/2011.  Findings: 1320 hours.  There is stable cardiomegaly with chronic vascular congestion and bibasilar atelectasis.  As before, there may be small bilateral pleural effusions.  There  is no focal airspace disease or definite change compared with the prior studies.  The patient likely has a chronic rotator cuff tear on the left.  The thoracic esophagus appears dilated proximally.  IMPRESSION: Stable cardiomegaly, vascular congestion and bibasilar atelectasis.  Original Report Authenticated By: Vivia Ewing, M.D.   Dg Chest 2 View  01/19/2011  *RADIOLOGY REPORT*  Clinical Data: History of labored breathing.  History of congestive heart failure.  Follow-up.  CHEST - 2 VIEW  Comparison: 01/17/2011.  Findings: There is stable enlargement of the cardiac silhouette. Ectasia and tortuosity of thoracic aorta is seen.  There is moderate upper lobe vascular prominence consistent with mild pulmonary venous hypertension vascular congestion.  There is minimal right basilar atelectasis.  There is continued elevation of the left hemidiaphragm with moderate atelectasis in the left lung base.  No consolidation or  definite pleural effusion is seen. There is osteopenic appearance of the bones.  IMPRESSION: Stable enlargement cardiac silhouette.  Moderate vascular congestion pattern.  Minimal right basilar atelectasis.  Continued elevation of the left hemidiaphragm with moderate atelectasis in the left lung base.  No consolidation or definite pleural effusion is seen.  Original Report Authenticated By: Delane Ginger, M.D.   Dg Abd 1 View  01/31/2011  *RADIOLOGY REPORT*  Clinical Data: Left-sided abdominal pain; constipation.  ABDOMEN - 1 VIEW  Comparison: Abdominal radiograph and CT of the abdomen and pelvis performed 01/15/2011  Findings: The visualized bowel gas pattern is unremarkable.  Stool is noted filling the cecum and ascending colon, and also within the sigmoid colon; no abnormal dilatation of small bowel loops is seen to suggest small bowel obstruction.  No free intra-abdominal air is identified, though evaluation for free air is limited on a single supine view.  The visualized osseous structures are  within normal limits; the sacroiliac joints are unremarkable in appearance.  Diffuse vascular calcifications are noted.  IMPRESSION:  1.  Stool noted filling the cecum and ascending colon, without evidence of significant constipation.  No free intra-abdominal air seen. 2.  Diffuse vascular calcifications noted.  Original Report Authenticated By: Santa Lighter, M.D.   Ct Head Wo Contrast  01/31/2011  *RADIOLOGY REPORT*  Clinical Data: Weakness.  CT HEAD WITHOUT CONTRAST  Technique:  Contiguous axial images were obtained from the base of the skull through the vertex without contrast.  Comparison: Head CT scan 01/11/2011.  Findings: Again seen is cortical atrophy and chronic microvascular ischemic change.  No evidence of acute infarction, hemorrhage, mass lesion, mass effect, midline shift or abnormal extra-axial fluid collection.  Mucosal thickening left maxillary sinus is noted.  IMPRESSION: No acute finding.  Original Report Authenticated By: Arvid Right. Luther Parody, M.D.   Ct Head Wo Contrast  01/11/2011  *RADIOLOGY REPORT*  Clinical Data: Headache.  History of CVA with residual right-sided weakness status post fall 1 week ago.  Evaluate for progressive bleeding.  CT HEAD WITHOUT CONTRAST  Technique:  Contiguous axial images were obtained from the base of the skull through the vertex without contrast.  Comparison: CT 01/05/2011.  MRI 01/06/2011.  Findings: A small amount of intraventricular hemorrhage was noted layering in the occipital horns on the prior examinations. This is not visible on the current examination.  There is no evidence of progressive hemorrhage or extra-axial fluid collection.  There is no evidence of acute infarction.  Atrophy and mild chronic small vessel extending changes are stable.  There is mild mucosal thickening in the right ethmoid and frontal sinuses.  The calvarium is intact.  IMPRESSION: Stable examination with atrophy and chronic small vessel ischemic changes.  No evidence of  progressive hemorrhage or hydrocephalus.  Original Report Authenticated By: Vivia Ewing, M.D.   Mr Brain Wo Contrast  01/06/2011  *RADIOLOGY REPORT*  Clinical Data: Evaluated for possible stroke.  Altered mental status.  Recent fall  MRI HEAD WITHOUT CONTRAST  Technique:  Multiplanar, multiecho pulse sequences of the brain and surrounding structures were obtained according to standard protocol without intravenous contrast.  Comparison: CT head 01/05/2011.  Findings: The patient was moderately uncooperative and could not remain motionless for the study.  Images are suboptimal.  Small or subtle lesions could be overlooked.  Motion degraded diffusion weighted images show no acute stroke. There is advanced atrophy with chronic microvascular ischemic change. A small osteoma projects from the inner table of the calvarium in the right  frontal bone.  There are small foci of intraventricular hemorrhage layering posteriorly on gradient sequence left greater than right (images 11 and 12 of series 10) reflecting previous closed head injury.  No parenchymal bleed is seen.  There is no midline shift.  Intracranial vasculature is grossly patent. Pituitary and cerebellar tonsils grossly unremarkable.  The foci of hemorrhage are better seen on MR than on CT.  IMPRESSION: No acute stroke.  Subtle interventricular hemorrhage reflecting previous closed head injury.  Atrophy and small vessel disease.  Original Report Authenticated By: Staci Righter, M.D.   Nm Pulmonary Per & Vent  01/22/2011  *RADIOLOGY REPORT*  Clinical Data:  Shortness of breath and elevated D-dimer.  NUCLEAR MEDICINE VENTILATION - PERFUSION LUNG SCAN  Technique:  Wash-in, equilibrium, and wash-out phase ventilation images were obtained using Xe-133 gas.  Perfusion images were obtained in multiple projections after intravenous injection of Tc- 19m MAA.  Radiopharmaceuticals:  5.4 mCi Xe-133 gas and 5.5 mCi Tc-48m MAA.  Comparison:  Chest radiograph  01/21/2011  Findings: The perfusion images are essentially normal.  There are no peripheral or large perfusion abnormalities.  There is no evidence for a ventilation-perfusion mismatch.  There may be decreased ventilation at the left lung base compared to the perfusion.  IMPRESSION: Very low probability for pulmonary embolism.  Original Report Authenticated By: Markus Daft, M.D.   Dg Chest Port 1 View  01/31/2011  *RADIOLOGY REPORT*  Clinical Data: Shortness of breath and cough.  PORTABLE CHEST - 1 VIEW  Comparison: Plain film chest 01/21/2011.  Findings: There is elevation of the left hemidiaphragm with some subsegmental atelectasis in the left base.  The right lung is clear.  There is cardiomegaly.  IMPRESSION: Cardiomegaly and subsegmental atelectasis left lung base.  Original Report Authenticated By: Arvid Right. Luther Parody, M.D.   Dg Chest Port 1 View  01/21/2011  *RADIOLOGY REPORT*  Clinical Data: Shortness of breath.  PORTABLE CHEST - 1 VIEW  Comparison: Chest x-ray 01/19/2011.  Findings: The heart is enlarged but stable.  There is vascular congestion and mild interstitial pulmonary edema.  Small pleural effusions are noted along with left greater than right basilar atelectasis.  IMPRESSION: CHF.  Original Report Authenticated By: P. Kalman Jewels, M.D.   Dg Chest Port 1 View  01/17/2011  *RADIOLOGY REPORT*  Clinical Data: Short of breath.  Weakness.  PORTABLE CHEST - 1 VIEW  Comparison: 01/12/2011.  Findings: Cardiomegaly with pulmonary vascular congestion and interstitial pulmonary edema.  The chronic elevation of the left hemidiaphragm with left basilar atelectasis.  Tortuous thoracic aorta.  IMPRESSION: Cardiomegaly and interstitial pulmonary edema compatible with mild CHF.  Original Report Authenticated By: Dereck Ligas, M.D.   Dg Chest Port 1 View  01/09/2011  *RADIOLOGY REPORT*  Clinical Data: Question of CHF.  Shortness of breath.  PORTABLE CHEST - 1 VIEW  Comparison: 01/05/2011  Findings:  Heart is enlarged.  The film is made with shallow lung inflation.  There is pulmonary vascular congestion but no overt edema.  There are no focal consolidations.  There is minimal left lower lobe subsegmental atelectasis.  Small bilateral pleural effusions are suspected.  IMPRESSION:  1.  Cardiomegaly and vascular congestion. 2.  Left base atelectasis. 3.  Small bilateral pleural effusions are suspected.  Original Report Authenticated By: Glenice Bow, M.D.   Dg Abd Acute W/chest  01/15/2011  *RADIOLOGY REPORT*  Clinical Data: Abdominal pain  ACUTE ABDOMEN SERIES (ABDOMEN 2 VIEW & CHEST 1 VIEW)  Comparison: CT abdomen pelvis dated  11/02/2010  Findings: Cardiomegaly with pulmonary vascular congestion.  No frank interstitial edema. No pleural effusion or pneumothorax.  Nonobstructive bowel gas pattern.  Moderate stool throughout the colon.  No evidence of free air under the diaphragm on the upright view.  Degenerative changes of the visualized thoracolumbar spine.  Vascular calcifications.  IMPRESSION: Cardiomegaly with pulmonary vascular congestion.  No frank interstitial edema.  No evidence of small bowel obstruction or free air.  Moderate stool throughout the colon.  Original Report Authenticated By: Julian Hy, M.D.    Procedures:  EKG: Sinus bradycardia with 1st degree A-V block Left ventricular hypertrophy with repolarization abnormality Prolonged QT  Full Code   Hospital Course: See H&P for complete admission details. The patient is an 78 year old black female with multiple medical problems including chronic diastolic heart failure, chronic kidney disease, now end stage. She has had multiple hospitalizations within the past several months. She had been living at home alone and has previously refused placement despite much encouragement by family members and physicians alike. She was bradycardic on admission. Her admission EKG showed a heart rate of 49. Her vital signs were otherwise  stable. During the last hospitalization, she had been treated for diastolic heart failure. Apparently, her clonidine dose had been increased as well. Nephrology and cardiology were consulted. Her Coreg was stopped and her clonidine was decreased. Her heart rate normalized. Cardiology felt that her syncopal episode was likely related to bradycardia. They did not feel that she would need a pacemaker at this time, but would consider it in the future should she continue to have problems with bradycardia and tachycardia. Nephrology was consulted and recommended we start dialysis. Patient already has an AV fistula which hasn't matured. She tolerated dialysis while here and dialysis will be arranged as an outpatient. Physical therapy worked with her. It was felt that patient is no longer safe to live at home alone, as evidenced by her recurrent trips to the emergency room and admission to the hospital. After much coaxing by family members and hospital staff, she did agree to placement at assisted living facility. Her other medical problems have been stable. Total time on the day of discharge is greater than 30 minutes.  Discharge Exam:  Blood pressure 96/81, pulse 73, temperature 97.8 F (36.6 C), temperature source Oral, resp. rate 20, height 5\' 5"  (1.651 m), weight 80.9 kg (178 lb 5.6 oz), last menstrual period 12/24/2010, SpO2 100.00%.  Unchanged from 02/04/2011   Signed: Doree Barthel L 02/05/2011, 12:31 PM

## 2011-02-05 NOTE — Progress Notes (Signed)
Physical Therapy Treatment Patient Details Name: [name] MRN: [id number] DOB: 1925-04-27 Today's Date: 02/05/2011  PT Assessment/Plan  Therapy attempted; pt out of room for dialysis, will attempt at later time when appropriate PT Goals     PT Treatment Precautions/Restrictions  Precautions Precautions: Fall Required Braces or Orthoses: No Restrictions Weight Bearing Restrictions: No Mobility (including Balance)      Exercise    End of Session    [name], [place] 02/05/2011, 10:17 AM

## 2011-02-05 NOTE — Plan of Care (Signed)
Patient is new to hemodialysis.  Today is her second hemodialysis treatment.  Patient is to be discharged today after dialysis.

## 2011-02-08 ENCOUNTER — Telehealth: Payer: Self-pay

## 2011-02-08 ENCOUNTER — Other Ambulatory Visit: Payer: Self-pay | Admitting: Family Medicine

## 2011-02-08 NOTE — Telephone Encounter (Signed)
I spoke with pt ALF,she was seen at [facility] for severe anxiety, given Xanax 0.25mg  to be used TID and Valium I discontinued to the Valium, She will f/u on Thursday for Medication reconcillation and hospital f/u. ALF to bring her MAR and any concerns as well as CBG log

## 2011-02-11 ENCOUNTER — Ambulatory Visit (INDEPENDENT_AMBULATORY_CARE_PROVIDER_SITE_OTHER): Payer: Medicare HMO | Admitting: Family Medicine

## 2011-02-11 ENCOUNTER — Encounter: Payer: Self-pay | Admitting: Family Medicine

## 2011-02-11 VITALS — BP 140/74 | HR 93 | Resp 18 | Ht 65.0 in | Wt 140.0 lb

## 2011-02-11 DIAGNOSIS — F411 Generalized anxiety disorder: Secondary | ICD-10-CM

## 2011-02-11 DIAGNOSIS — I10 Essential (primary) hypertension: Secondary | ICD-10-CM

## 2011-02-11 DIAGNOSIS — S40029A Contusion of unspecified upper arm, initial encounter: Secondary | ICD-10-CM

## 2011-02-11 DIAGNOSIS — G4700 Insomnia, unspecified: Secondary | ICD-10-CM

## 2011-02-11 DIAGNOSIS — R609 Edema, unspecified: Secondary | ICD-10-CM

## 2011-02-11 DIAGNOSIS — E118 Type 2 diabetes mellitus with unspecified complications: Secondary | ICD-10-CM

## 2011-02-11 DIAGNOSIS — F3289 Other specified depressive episodes: Secondary | ICD-10-CM

## 2011-02-11 DIAGNOSIS — Z992 Dependence on renal dialysis: Secondary | ICD-10-CM

## 2011-02-11 DIAGNOSIS — F329 Major depressive disorder, single episode, unspecified: Secondary | ICD-10-CM

## 2011-02-11 DIAGNOSIS — R001 Bradycardia, unspecified: Secondary | ICD-10-CM

## 2011-02-11 DIAGNOSIS — N186 End stage renal disease: Secondary | ICD-10-CM

## 2011-02-11 DIAGNOSIS — F32A Depression, unspecified: Secondary | ICD-10-CM

## 2011-02-11 DIAGNOSIS — R634 Abnormal weight loss: Secondary | ICD-10-CM

## 2011-02-11 MED ORDER — MIRTAZAPINE 15 MG PO TABS: 15.0000 mg | ORAL_TABLET | Freq: Every day | ORAL | Status: DC

## 2011-02-11 MED ORDER — HYDROCODONE-ACETAMINOPHEN 5-500 MG PO TABS: 1.0000 | ORAL_TABLET | Freq: Every day | ORAL | Status: DC | PRN

## 2011-02-11 MED ORDER — LORAZEPAM 0.5 MG PO TABS: 0.5000 mg | ORAL_TABLET | Freq: Two times a day (BID) | ORAL | Status: AC | PRN

## 2011-02-11 NOTE — Progress Notes (Signed)
  Subjective:    Patient ID: [name], female    DOB: 1925/10/09, 78 y.o.   MRN: [id number]  HPI  Pt here with her son today [facility]- [phone] ,  [facility] follow-up- pt admitted three times in 1 month period for CHF and bradycardia. Last admission she was sent to [facility] as the family cannot provide 24-hour care.   CHF- diastolic heart failure. She is maintained on diuretics. Most recently she had an episode of bradycardia. Many of her antihypertensives and rate control as were stopped. She is followup with Dr. Debara Pickett in the next 2 weeks.  Depresion/anxiety- since discharge she has been feeling very depressed as well as anxious. She does not like living in the new home. She wants to be with her children is unable to do so. She understands that she needs 24 hour care because her recent falls and her recurrent trips to the hospital. She feels very sad and feels like she cannot digest. She was also started on hemodialysis  at the last hospitalization and this is taking a toll on her as well. She has lost a considerable amount of weight as she is not eating. She is unable to sleep as well. She was seen at [facility] emergency room and started on Valium and Xanax for anxiety. I did speak with the representative at [facility] and  the Valium was discontinued before pt received this.  DM- blood sugars have been very elevated with fasting in the 200's.   HTN- blood pressure have been slightly elevated however many meds stopped secondary to bradycardia  LEG swelling/pain- she has a lot of leg pain, with or without exertion, swelling also contributes to discomfort, they facility is asking for something for pain  Code Status- we discussed code states she is Full Code  ESRD- started on HD, Dr. Hinda Lenis is neprohologist   Review of Systems  GEN- denies fatigue, fever, weight loss,weakness, recent illness HEENT- denies eye drainage, change in vision, nasal  discharge, CVS- denies chest pain, palpitations RESP- denies SOB, cough, wheeze ABD- denies N/V, change in stools, abd pain GU- denies dysuria, hematuria, dribbling, incontinence MSK- denies joint pain, muscle aches, injury Neuro- denies headache, dizziness, syncope, seizure activity       Objective:   Physical Exam  GEN- NAD, pleasant, alert and oriented, hard of hearing, in wheelchair today, weight down 20lbs CVS- RRR, 3/6 SEM  RESP- CTAB , no rhonchi, normal WOB at rest, ABD- NABS, soft, NT, ND EXT- 1+ edema- mostly pedal Skin- LUE Fistula- +thrill and bruit/ ecchymosis surrounding, no hematoma Psych- depressed appearing, crying, not anxious appearing, no apparent SI, normal mentation     Assessment & Plan:

## 2011-02-11 NOTE — Patient Instructions (Signed)
Poirier sleep and appetite start Remeron at bedtime For your depression and nerves I have changed the medication to Ativan instead of Xanax For pain she can take Vicodin once a day as needed Increase the insulin to 7 units twice a day F/U in 2 weeks

## 2011-02-12 ENCOUNTER — Encounter: Payer: Self-pay | Admitting: Family Medicine

## 2011-02-12 DIAGNOSIS — R634 Abnormal weight loss: Secondary | ICD-10-CM | POA: Insufficient documentation

## 2011-02-12 DIAGNOSIS — N186 End stage renal disease: Secondary | ICD-10-CM | POA: Insufficient documentation

## 2011-02-12 NOTE — Assessment & Plan Note (Signed)
Increased in setting of new move to nursing facility and starting HD. Will change to Ativan and d/c xanax

## 2011-02-12 NOTE — Assessment & Plan Note (Signed)
Encouraged small meals , addition of supplement shakes Increase 70/30 to 7 units BID

## 2011-02-12 NOTE — Assessment & Plan Note (Signed)
Mildly elevated BP today, will monitor off medications, she appears to have tachy-brady syndrome per review of notes and pacemaker was discussed but on hold at this time

## 2011-02-12 NOTE — Assessment & Plan Note (Signed)
Followup with cardiology as scheduled. No change in medications at this time

## 2011-02-12 NOTE — Assessment & Plan Note (Signed)
Compression hose at this time, leg edema much improved

## 2011-02-12 NOTE — Assessment & Plan Note (Signed)
She is having considerable difficulty with adjustment to hemodialysis in her new home. This is very common especially in the elderly population where he cannot surround by the family any longer. At this time she is on Celexa I will discontinued Xanax and start Ativan. Remeron will be used at bedtime for sleep this will hopefully help her appetite is well she has had a good amount of weight loss over the past month

## 2011-02-12 NOTE — Assessment & Plan Note (Signed)
Start Remeron

## 2011-02-12 NOTE — Assessment & Plan Note (Signed)
Bruising on arm from hemodialysis. No evidence of hematoma

## 2011-02-12 NOTE — Assessment & Plan Note (Signed)
Considerable amount of weight loss, poor appetite and depressed mood, as well as worsening renal failure.

## 2011-02-25 ENCOUNTER — Ambulatory Visit: Payer: Medicare HMO | Admitting: Family Medicine

## 2011-03-05 ENCOUNTER — Other Ambulatory Visit: Payer: Self-pay

## 2011-03-05 MED ORDER — LEVOTHYROXINE SODIUM 25 MCG PO TABS: 25.0000 ug | ORAL_TABLET | Freq: Every day | ORAL | Status: DC

## 2011-03-05 MED ORDER — ASPIRIN EC 81 MG PO TBEC: 81.0000 mg | DELAYED_RELEASE_TABLET | Freq: Every day | ORAL | Status: DC

## 2011-03-05 MED ORDER — AMLODIPINE BESYLATE 10 MG PO TABS: 10.0000 mg | ORAL_TABLET | ORAL | Status: DC

## 2011-03-05 MED ORDER — ISOSORBIDE MONONITRATE ER 60 MG PO TB24: 60.0000 mg | ORAL_TABLET | Freq: Every day | ORAL | Status: DC

## 2011-03-05 MED ORDER — SIMVASTATIN 10 MG PO TABS: 10.0000 mg | ORAL_TABLET | Freq: Every day | ORAL | Status: DC

## 2011-03-05 MED ORDER — CITALOPRAM HYDROBROMIDE 20 MG PO TABS: 20.0000 mg | ORAL_TABLET | ORAL | Status: DC

## 2011-03-05 MED ORDER — HYDRALAZINE HCL 100 MG PO TABS: 50.0000 mg | ORAL_TABLET | Freq: Three times a day (TID) | ORAL | Status: DC

## 2011-03-09 ENCOUNTER — Other Ambulatory Visit: Payer: Self-pay

## 2011-03-09 MED ORDER — MIRTAZAPINE 15 MG PO TABS: 15.0000 mg | ORAL_TABLET | Freq: Every day | ORAL | Status: DC

## 2011-03-11 ENCOUNTER — Encounter: Payer: Self-pay | Admitting: Family Medicine

## 2011-03-11 ENCOUNTER — Ambulatory Visit (INDEPENDENT_AMBULATORY_CARE_PROVIDER_SITE_OTHER): Payer: Medicare HMO | Admitting: Family Medicine

## 2011-03-11 VITALS — BP 140/64 | HR 88 | Resp 18 | Ht 65.0 in | Wt 168.0 lb

## 2011-03-11 DIAGNOSIS — E118 Type 2 diabetes mellitus with unspecified complications: Secondary | ICD-10-CM

## 2011-03-11 DIAGNOSIS — R634 Abnormal weight loss: Secondary | ICD-10-CM

## 2011-03-11 DIAGNOSIS — F32A Depression, unspecified: Secondary | ICD-10-CM

## 2011-03-11 DIAGNOSIS — F329 Major depressive disorder, single episode, unspecified: Secondary | ICD-10-CM

## 2011-03-11 DIAGNOSIS — F3289 Other specified depressive episodes: Secondary | ICD-10-CM

## 2011-03-11 DIAGNOSIS — K5900 Constipation, unspecified: Secondary | ICD-10-CM

## 2011-03-11 DIAGNOSIS — K649 Unspecified hemorrhoids: Secondary | ICD-10-CM

## 2011-03-11 MED ORDER — HYDROCORTISONE ACETATE 25 MG RE SUPP: 25.0000 mg | Freq: Two times a day (BID) | RECTAL | Status: DC

## 2011-03-11 MED ORDER — DOCUSATE SODIUM 100 MG PO CAPS: 100.0000 mg | ORAL_CAPSULE | Freq: Two times a day (BID) | ORAL | Status: DC

## 2011-03-11 NOTE — Patient Instructions (Addendum)
Increase  70/30 to  10 units BID Start Colace 100mg  BID stool softener For hemorrhoids - suppository as prescribed F/U 4 weeks

## 2011-03-14 ENCOUNTER — Encounter: Payer: Self-pay | Admitting: Family Medicine

## 2011-03-14 DIAGNOSIS — K649 Unspecified hemorrhoids: Secondary | ICD-10-CM | POA: Insufficient documentation

## 2011-03-14 NOTE — Assessment & Plan Note (Signed)
Mood improved with adjustment to new home. Continue remeron, she has not required ativan

## 2011-03-14 NOTE — Assessment & Plan Note (Signed)
Weight not up 28lbs since last visit. This is still below her previous weight but now eating regular meals

## 2011-03-14 NOTE — Assessment & Plan Note (Signed)
Increase insulin to 10 units BID

## 2011-03-14 NOTE — Assessment & Plan Note (Signed)
Colace BID

## 2011-03-14 NOTE — Progress Notes (Signed)
  Subjective:    Patient ID: [name], female    DOB: 01-30-1925, 78 y.o.   MRN: [id number]  HPI pt here for f/u  DM- blood sugars still elevated, fasting 150-200's, she states her meals are not diabetic, in the evening CBG 87-250's, taking 70/30  7 units BID  Hemorrhoids- has had pain in her bottom,denies blood in stool, feels heavy after sitting in dialysis for long periods of time occ feels like they are coming out.   Appetite- much improved, she has gained weight since our last visit, taking remeron  Depression- she feels she is adjusting slowly to the facility, does not feel as depressed, she just wants to do more activities, family visits her often  ESRD- doing well on HD   Review of Systems   GEN- denies fatigue, fever, weight loss,weakness, recent illness HEENT- denies eye drainage, change in vision, nasal discharge, CVS- denies chest pain, palpitations RESP- denies SOB, cough, wheeze ABD- denies N/V, change in stools, abd pain GU- denies dysuria, hematuria, dribbling, incontinence MSK- denies joint pain, muscle aches, injury Neuro- denies headache, dizziness, syncope, seizure actvity    Objective:   Physical Exam GEN- NAD, pleasant, alert and oriented, hard of hearing, in wheelchair today,+weight gain CVS- RRR, 3/6 SEM  RESP- CTAB , no rhonchi, normal WOB at rest, ABD- NABS, soft, NT, ND EXT- 1+ edema- mostly pedal Rectal- small external hemorrhoid noted, no prolapse, normal rectal tone, FOBT negative Psych- not depressed, not anxious appearing, no apparent SI, normal mentation, smiling         Assessment & Plan:

## 2011-03-14 NOTE — Assessment & Plan Note (Signed)
Pt unable to get into sitz bath, will send cortisone suppository, stool softner

## 2011-03-25 ENCOUNTER — Ambulatory Visit: Payer: Medicare Other | Admitting: Family Medicine

## 2011-04-05 ENCOUNTER — Other Ambulatory Visit: Payer: Self-pay

## 2011-04-05 MED ORDER — DOCUSATE SODIUM 100 MG PO CAPS: 100.0000 mg | ORAL_CAPSULE | Freq: Two times a day (BID) | ORAL | Status: AC

## 2011-04-08 ENCOUNTER — Ambulatory Visit (INDEPENDENT_AMBULATORY_CARE_PROVIDER_SITE_OTHER): Payer: Medicare HMO | Admitting: Family Medicine

## 2011-04-08 ENCOUNTER — Encounter: Payer: Self-pay | Admitting: Family Medicine

## 2011-04-08 VITALS — BP 154/68 | HR 91 | Resp 18 | Ht 65.0 in | Wt 170.0 lb

## 2011-04-08 DIAGNOSIS — K649 Unspecified hemorrhoids: Secondary | ICD-10-CM

## 2011-04-08 DIAGNOSIS — I10 Essential (primary) hypertension: Secondary | ICD-10-CM

## 2011-04-08 DIAGNOSIS — N6320 Unspecified lump in the left breast, unspecified quadrant: Secondary | ICD-10-CM

## 2011-04-08 DIAGNOSIS — N63 Unspecified lump in unspecified breast: Secondary | ICD-10-CM

## 2011-04-08 DIAGNOSIS — E118 Type 2 diabetes mellitus with unspecified complications: Secondary | ICD-10-CM

## 2011-04-08 MED ORDER — HYDROCORTISONE ACETATE 25 MG RE SUPP: 25.0000 mg | Freq: Two times a day (BID) | RECTAL | Status: AC | PRN

## 2011-04-08 NOTE — Patient Instructions (Signed)
Continue current meds Mammogram to be scheduled for Left breast mass F/U 3 months

## 2011-04-11 NOTE — Assessment & Plan Note (Signed)
Send for Mammogram

## 2011-04-11 NOTE — Assessment & Plan Note (Signed)
Defer to nephrology, as her BP lowers after HD,

## 2011-04-11 NOTE — Assessment & Plan Note (Signed)
Blood sugars improved, will hold at current dose, pt has had some hypoglycemia

## 2011-04-11 NOTE — Assessment & Plan Note (Signed)
Improved, will send refill on anusol

## 2011-04-11 NOTE — Progress Notes (Signed)
  Subjective:    Patient ID: [name], female    DOB: 09-25-1925, 78 y.o.   MRN: [id number]  HPI Pt here to f/u DM  DM- 70/30 increased to 10 units BID last visit, fasting 110-184, pre dinner 95-305, 1 episode of hypoglycemia resolved with juice. She feels well  Breast mass- felt a lump on her right breat  Hemorrhoid pain- improved, but would like to have suppositories available  HD doing well, labs have been good  Review of Systems      GEN- denies fatigue, fever, weight loss,weakness, recent illness HEENT- denies eye drainage, change in vision, nasal discharge, CVS- denies chest pain, palpitations RESP- denies SOB, cough, wheeze ABD- denies N/V, change in stools, abd pain GU- denies dysuria, hematuria, dribbling, incontinence MSK- denies joint pain, muscle aches, injury Neuro- denies headache, dizziness, syncope, seizure actvit Objective:   Physical Exam GEN- NAD, pleasant, alert and oriented, hard of hearing, in wheelchair today,+weight gain 2lbs Breast- normal symmetry, no nipple inversion,no nipple drainage, left breast nodules felt at 3 oclock position, non tender, no erythema, no axillar nodes CVS- RRR, 3/6 SEM  RESP- CTAB , no rhonchi, normal WOB at rest, ABD- NABS, soft, NT, ND EXT- 1+ edema- mostly pedal       Assessment & Plan:

## 2011-05-28 ENCOUNTER — Other Ambulatory Visit: Payer: Self-pay

## 2011-05-28 MED ORDER — INSULIN ASPART PROT & ASPART (70-30 MIX) 100 UNIT/ML ~~LOC~~ SUSP: 10.0000 [IU] | Freq: Two times a day (BID) | SUBCUTANEOUS | Status: DC

## 2011-06-29 ENCOUNTER — Telehealth: Payer: Self-pay | Admitting: Family Medicine

## 2011-07-02 ENCOUNTER — Other Ambulatory Visit: Payer: Self-pay

## 2011-07-02 MED ORDER — MIRTAZAPINE 15 MG PO TABS: 15.0000 mg | ORAL_TABLET | Freq: Every day | ORAL | Status: DC

## 2011-07-02 MED ORDER — ASPIRIN EC 81 MG PO TBEC: 81.0000 mg | DELAYED_RELEASE_TABLET | Freq: Every day | ORAL | Status: DC

## 2011-07-08 ENCOUNTER — Encounter: Payer: Self-pay | Admitting: Family Medicine

## 2011-07-08 ENCOUNTER — Ambulatory Visit (INDEPENDENT_AMBULATORY_CARE_PROVIDER_SITE_OTHER): Payer: Medicare Other | Admitting: Family Medicine

## 2011-07-08 VITALS — BP 140/70 | HR 73 | Resp 16 | Ht 65.0 in | Wt 172.2 lb

## 2011-07-08 DIAGNOSIS — E785 Hyperlipidemia, unspecified: Secondary | ICD-10-CM

## 2011-07-08 DIAGNOSIS — Z992 Dependence on renal dialysis: Secondary | ICD-10-CM

## 2011-07-08 DIAGNOSIS — N6310 Unspecified lump in the right breast, unspecified quadrant: Secondary | ICD-10-CM

## 2011-07-08 DIAGNOSIS — I10 Essential (primary) hypertension: Secondary | ICD-10-CM

## 2011-07-08 DIAGNOSIS — F411 Generalized anxiety disorder: Secondary | ICD-10-CM

## 2011-07-08 DIAGNOSIS — I509 Heart failure, unspecified: Secondary | ICD-10-CM

## 2011-07-08 DIAGNOSIS — E039 Hypothyroidism, unspecified: Secondary | ICD-10-CM

## 2011-07-08 DIAGNOSIS — E119 Type 2 diabetes mellitus without complications: Secondary | ICD-10-CM

## 2011-07-08 DIAGNOSIS — I5032 Chronic diastolic (congestive) heart failure: Secondary | ICD-10-CM

## 2011-07-08 DIAGNOSIS — E118 Type 2 diabetes mellitus with unspecified complications: Secondary | ICD-10-CM

## 2011-07-08 DIAGNOSIS — N63 Unspecified lump in unspecified breast: Secondary | ICD-10-CM

## 2011-07-08 DIAGNOSIS — N186 End stage renal disease: Secondary | ICD-10-CM

## 2011-07-08 LAB — TSH: TSH: 1.154 u[IU]/mL (ref 0.350–4.500)

## 2011-07-08 LAB — HEMOGLOBIN A1C: Hgb A1c MFr Bld: 5.7 % — ABNORMAL HIGH (ref <5.7)

## 2011-07-08 MED ORDER — HYDROCODONE-ACETAMINOPHEN 5-500 MG PO TABS: 1.0000 | ORAL_TABLET | Freq: Every day | ORAL | Status: AC | PRN

## 2011-07-08 NOTE — Patient Instructions (Signed)
See instructions for [employer]

## 2011-07-11 DIAGNOSIS — N6310 Unspecified lump in the right breast, unspecified quadrant: Secondary | ICD-10-CM | POA: Insufficient documentation

## 2011-07-11 NOTE — Progress Notes (Signed)
  Subjective:    Patient ID: KANAYA DIBONA, female    DOB: Mar 08, 1925, 78 y.o.   MRN: [id number]  HPI Pt here to f/u chronic medical problems Medications reviewed, resides at [facility] Right breast mass found on Mammogram, pt to return to breast center next Tuesday for work-up DM- blood sugars doing well 78-202 pre dinner, fasting 100-140, pt only remembers 1 episode of hypoglyemia   Review of Systems   GEN- denies fatigue, fever, weight loss,weakness, recent illness HEENT- denies eye drainage, change in vision, nasal discharge, CVS- denies chest pain, palpitations RESP- denies SOB, cough, wheeze ABD- denies N/V, change in stools, abd pain GU- denies dysuria, hematuria, dribbling, incontinence MSK- denies  joint pain, muscle aches, injury Neuro- denies headache, dizziness, syncope, seizure activity      Objective:   Physical Exam GEN- NAD, pleasant, alert and oriented, hard of hearing, looks well today HEENT- PERRL, EOMI, MMM CVS- RRR, 3/6 SEM  RESP- CTAB , decreased at right base ABD- NABS, soft, NT, ND EXT- trace edema Psych-normal affect and mood        Assessment & Plan:

## 2011-07-11 NOTE — Assessment & Plan Note (Addendum)
A1C obtained, well controlled, Will decrease insulin to 8 units at dinner

## 2011-07-11 NOTE — Assessment & Plan Note (Signed)
At goal, low dose statin

## 2011-07-11 NOTE — Assessment & Plan Note (Signed)
Labile but on HD

## 2011-07-11 NOTE — Assessment & Plan Note (Signed)
tolearting HD

## 2011-07-11 NOTE — Assessment & Plan Note (Signed)
She has adjusted quite well, rare use of benzo per [facility]

## 2011-07-11 NOTE — Assessment & Plan Note (Signed)
Currently compensated, seen by cardiology this AM will await note

## 2011-07-14 ENCOUNTER — Encounter (HOSPITAL_COMMUNITY): Payer: Medicare HMO

## 2011-08-04 ENCOUNTER — Encounter: Payer: Self-pay | Admitting: Family Medicine

## 2011-08-04 NOTE — Telephone Encounter (Signed)
Daughter is aware [name]

## 2011-08-12 ENCOUNTER — Encounter: Payer: Self-pay | Admitting: Family Medicine

## 2011-09-06 ENCOUNTER — Telehealth: Payer: Self-pay | Admitting: Family Medicine

## 2011-09-06 NOTE — Telephone Encounter (Signed)
Nurse from [employer] called stating that there was an employee incident in which a med tech was stuck with an insulin needle after injecting the patient.  They are now calling for an order for blood work under their protocol.  The tests that they are asking to be ordered are as such:  SGOT, HbSAG, and HIV tests.  Please fax to [phone]

## 2011-09-06 NOTE — Telephone Encounter (Signed)
Order placed

## 2011-09-07 ENCOUNTER — Other Ambulatory Visit: Payer: Self-pay

## 2011-09-07 MED ORDER — AMLODIPINE BESYLATE 10 MG PO TABS: 10.0000 mg | ORAL_TABLET | ORAL | Status: DC

## 2011-09-07 MED ORDER — LEVOTHYROXINE SODIUM 25 MCG PO TABS: 25.0000 ug | ORAL_TABLET | Freq: Every day | ORAL | Status: DC

## 2011-09-07 MED ORDER — ISOSORBIDE MONONITRATE ER 60 MG PO TB24: 60.0000 mg | ORAL_TABLET | Freq: Every day | ORAL | Status: DC

## 2011-09-07 MED ORDER — SIMVASTATIN 10 MG PO TABS: 10.0000 mg | ORAL_TABLET | Freq: Every day | ORAL | Status: DC

## 2011-09-07 MED ORDER — CITALOPRAM HYDROBROMIDE 20 MG PO TABS: 20.0000 mg | ORAL_TABLET | ORAL | Status: DC

## 2011-10-04 ENCOUNTER — Encounter: Payer: Self-pay | Admitting: Family Medicine

## 2011-10-12 ENCOUNTER — Encounter: Payer: Self-pay | Admitting: Family Medicine

## 2011-10-12 ENCOUNTER — Ambulatory Visit (INDEPENDENT_AMBULATORY_CARE_PROVIDER_SITE_OTHER): Payer: Medicare HMO | Admitting: Family Medicine

## 2011-10-12 VITALS — BP 122/70 | HR 72 | Resp 15 | Ht 65.0 in | Wt 186.0 lb

## 2011-10-12 DIAGNOSIS — N63 Unspecified lump in unspecified breast: Secondary | ICD-10-CM

## 2011-10-12 DIAGNOSIS — N6310 Unspecified lump in the right breast, unspecified quadrant: Secondary | ICD-10-CM

## 2011-10-12 DIAGNOSIS — E118 Type 2 diabetes mellitus with unspecified complications: Secondary | ICD-10-CM

## 2011-10-12 DIAGNOSIS — K5900 Constipation, unspecified: Secondary | ICD-10-CM

## 2011-10-12 DIAGNOSIS — J9610 Chronic respiratory failure, unspecified whether with hypoxia or hypercapnia: Secondary | ICD-10-CM

## 2011-10-12 DIAGNOSIS — N186 End stage renal disease: Secondary | ICD-10-CM

## 2011-10-12 DIAGNOSIS — R635 Abnormal weight gain: Secondary | ICD-10-CM

## 2011-10-12 DIAGNOSIS — I10 Essential (primary) hypertension: Secondary | ICD-10-CM

## 2011-10-12 MED ORDER — POLYETHYLENE GLYCOL 3350 17 GM/SCOOP PO POWD: 17.0000 g | Freq: Every day | ORAL | Status: DC

## 2011-10-12 NOTE — Progress Notes (Signed)
  Subjective:    Patient ID: [name], female    DOB: 12-17-1925, 78 y.o.   MRN: [id number]  HPI Patient here to follow chronic medical problems. Medications reviewed from her senior living center. Diabetes mellitus there were no blood sugar records today. She denies any hypoglycemia, states she is being given very bad food to eat, lots of carbs such as pizza, fries, corndogs, potatoes on a regular basis Constipation-states her bowels have been backed up and she has not gone on a regular basis. She has a stool softener but this does not help Cramps-since her dialysis time was increased she's had more leg cramps and arm cramps. Her weight has increased therefore they had to change her dialysis settings.   Review of Systems   GEN- denies fatigue, fever, weight loss,weakness, recent illness HEENT- denies eye drainage, change in vision, nasal discharge, CVS- denies chest pain, palpitations RESP- denies SOB, cough, wheeze ABD- denies N/V, change in stools, abd pain GU- denies dysuria, hematuria, dribbling, incontinence MSK- denies joint pain, muscle aches, injury, + muscle spasm Neuro- denies headache, dizziness, syncope, seizure activity      Objective:   Physical Exam GEN- NAD, pleasant, alert and oriented, hard of hearing, in wheelchair today,+weight gain HEENT- PERRL EOMI, Oropharynx clear Neck- fair ROM, no spasm CVS- RRR, 3/6 SEM  RESP- CTAB , no rhonchi,  ABD- NABS, soft, NT, ND EXT-  Pedal edema  Psych- not depressed, not anxious appearing,         Assessment & Plan:

## 2011-10-12 NOTE — Patient Instructions (Addendum)
F/U 3 months See [facility] sheet

## 2011-10-13 DIAGNOSIS — R635 Abnormal weight gain: Secondary | ICD-10-CM | POA: Insufficient documentation

## 2011-10-13 NOTE — Assessment & Plan Note (Signed)
unchanged

## 2011-10-13 NOTE — Assessment & Plan Note (Signed)
Deteriorated, add mirlax daily

## 2011-10-13 NOTE — Assessment & Plan Note (Signed)
Well controlled 

## 2011-10-13 NOTE — Assessment & Plan Note (Addendum)
Decrease to 8 units BID, continue to titrate, she may be able to come off insulin in near future Her last A1C was great at 5.7 in setting of renal disease, though she does still run high CBG at times

## 2011-10-13 NOTE — Assessment & Plan Note (Signed)
She attributes weight gain to making her a little more SOB with exertion, fluid status is good today, she is not using home oxygen on regular basis at this time

## 2011-10-13 NOTE — Assessment & Plan Note (Signed)
Benign per pt at follow-up will obtain last mammogram

## 2011-10-13 NOTE — Assessment & Plan Note (Signed)
Her diet is not optimal. Will discuss with senior living center Decrease remeron to 7.5mg 

## 2011-10-25 ENCOUNTER — Encounter: Payer: Self-pay | Admitting: Family Medicine

## 2011-11-17 ENCOUNTER — Other Ambulatory Visit: Payer: Self-pay

## 2011-11-17 MED ORDER — MIRTAZAPINE 15 MG PO TABS: 7.5000 mg | ORAL_TABLET | Freq: Every day | ORAL | Status: DC

## 2012-01-13 ENCOUNTER — Ambulatory Visit (INDEPENDENT_AMBULATORY_CARE_PROVIDER_SITE_OTHER): Payer: Medicare HMO | Admitting: Family Medicine

## 2012-01-13 ENCOUNTER — Encounter: Payer: Self-pay | Admitting: Family Medicine

## 2012-01-13 VITALS — Resp 18 | Ht 65.0 in

## 2012-01-13 DIAGNOSIS — F3289 Other specified depressive episodes: Secondary | ICD-10-CM

## 2012-01-13 DIAGNOSIS — F329 Major depressive disorder, single episode, unspecified: Secondary | ICD-10-CM

## 2012-01-13 DIAGNOSIS — E785 Hyperlipidemia, unspecified: Secondary | ICD-10-CM

## 2012-01-13 DIAGNOSIS — F32A Depression, unspecified: Secondary | ICD-10-CM

## 2012-01-13 DIAGNOSIS — J40 Bronchitis, not specified as acute or chronic: Secondary | ICD-10-CM

## 2012-01-13 DIAGNOSIS — R2681 Unsteadiness on feet: Secondary | ICD-10-CM

## 2012-01-13 DIAGNOSIS — G5690 Unspecified mononeuropathy of unspecified upper limb: Secondary | ICD-10-CM

## 2012-01-13 DIAGNOSIS — R269 Unspecified abnormalities of gait and mobility: Secondary | ICD-10-CM

## 2012-01-13 DIAGNOSIS — E118 Type 2 diabetes mellitus with unspecified complications: Secondary | ICD-10-CM

## 2012-01-13 DIAGNOSIS — I10 Essential (primary) hypertension: Secondary | ICD-10-CM

## 2012-01-13 DIAGNOSIS — E119 Type 2 diabetes mellitus without complications: Secondary | ICD-10-CM

## 2012-01-13 MED ORDER — PREDNISONE 10 MG PO TABS: ORAL_TABLET | ORAL | Status: DC

## 2012-01-13 MED ORDER — AZITHROMYCIN 250 MG PO TABS: ORAL_TABLET | ORAL | Status: AC

## 2012-01-13 NOTE — Assessment & Plan Note (Signed)
Will recheck fasting lipid panel she is currently on Zocor

## 2012-01-13 NOTE — Assessment & Plan Note (Signed)
We'll recheck her A1c in may be able to continue to decrease her insulin

## 2012-01-13 NOTE — Assessment & Plan Note (Signed)
Blood pressure elevated some today. No change the medications she has hemodialysis in the morning

## 2012-01-13 NOTE — Progress Notes (Signed)
  Subjective:    Patient ID: [name], female    DOB: 12/09/1925, 79 y.o.   MRN: [id number]  HPI Patient here to follow chronic medical problems. She's also had a cough and cold symptoms for the past week she's heard herself wheezing a little bit has not been given any albuterol from the assisted living facility. She is upset because she is unable to sleep well because of her roommate she will like to move to a different room however states no one does anything or listens at the living Center. She also states that there was a nurse at the [facility] that had been taking her pain medication because she was advised that she was receiving it every night and she has not this was approximately 2 months ago. There no blood sugar readings with her today she is receiving 8 units twice a day of 70/30 Dialysis is going well. She has lost 5 pounds she states she is trying to watch what she eats She also has numbness in her right hand fifth digit only for the past month   Review of Systems  - per above     GEN- denies fatigue, fever, weight loss,weakness, recent illness HEENT- denies eye drainage, change in vision, nasal discharge, CVS- denies chest pain, palpitations RESP- denies SOB, +cough, +wheeze ABD- denies N/V, change in stools, abd pain GU- denies dysuria, hematuria, dribbling, incontinence MSK- denies joint pain, muscle aches, injury Neuro- denies headache, dizziness, syncope, seizure activity      Objective:   Physical Exam GEN- NAD, alert and oriented x3 HEENT- PERRL, EOMI, non injected sclera, pink conjunctiva, MMM, oropharynx clear Neck- Supple, no LAD CVS- RRR,3/6 murmur RESP-course BS bilat, +wheeze, normal WOB at rest, +rhonchi ABD-NABS,soft,NT,ND Neuro- CNII-XII in tact, decreased strength RLE compared to right (chronic), normal and equal grasp bilat, sensation grossly in tact UE, normal tone EXT- pedal edema Pulses- Radial, DP- 2+        Assessment &  Plan:

## 2012-01-13 NOTE — Assessment & Plan Note (Signed)
Acute bronchitis. Antibiotics and low-dose prednisone use albuterol inhaler

## 2012-01-13 NOTE — Assessment & Plan Note (Signed)
Order for walker with seat, due to OA, history of CVA

## 2012-01-13 NOTE — Assessment & Plan Note (Signed)
Possible ulnar neuropathy basal the location where she feels the numbness. I do not see any change in tone her strength at this time we will just follow. Would not put her on any further medications for this.

## 2012-01-13 NOTE — Patient Instructions (Signed)
See [name] form Walker with Seat Antibiotics/prednisone Labs Tuesday

## 2012-01-13 NOTE — Assessment & Plan Note (Signed)
Her mood tends to swing back-and-forth regarding living at the assisted living facility. She was able to see her family over the holidays however in general is not happy at the Senior living facility she states her family is trying to find her somewhere else that they may be able to afford. For now I will continue her on her Celexa and she's also on 7.5 mg the Remeron, she has lost about 5 pounds since her last visit

## 2012-01-18 LAB — LIPID PANEL
HDL: 51 mg/dL (ref 35–70)
LDL Cholesterol: 57 mg/dL

## 2012-01-18 LAB — HEMOGLOBIN A1C: Hgb A1c MFr Bld: 6 % (ref 4.0–6.0)

## 2012-01-19 ENCOUNTER — Other Ambulatory Visit: Payer: Self-pay

## 2012-01-19 MED ORDER — INSULIN ASPART PROT & ASPART (70-30 MIX) 100 UNIT/ML ~~LOC~~ SUSP: 8.0000 [IU] | Freq: Two times a day (BID) | SUBCUTANEOUS | Status: DC

## 2012-01-20 ENCOUNTER — Ambulatory Visit: Payer: Medicare HMO | Admitting: Family Medicine

## 2012-01-27 ENCOUNTER — Encounter: Payer: Self-pay | Admitting: Family Medicine

## 2012-02-04 ENCOUNTER — Other Ambulatory Visit: Payer: Self-pay | Admitting: Family Medicine

## 2012-02-11 ENCOUNTER — Other Ambulatory Visit: Payer: Self-pay

## 2012-02-11 MED ORDER — DOCUSATE SODIUM 100 MG PO CAPS: 100.0000 mg | ORAL_CAPSULE | Freq: Two times a day (BID) | ORAL | Status: DC

## 2012-03-21 ENCOUNTER — Ambulatory Visit (INDEPENDENT_AMBULATORY_CARE_PROVIDER_SITE_OTHER): Payer: Medicare Other | Admitting: Family Medicine

## 2012-03-21 ENCOUNTER — Telehealth: Payer: Self-pay | Admitting: Family Medicine

## 2012-03-21 ENCOUNTER — Encounter: Payer: Self-pay | Admitting: Family Medicine

## 2012-03-21 VITALS — BP 130/70 | HR 66 | Resp 18 | Ht 65.0 in | Wt 183.0 lb

## 2012-03-21 DIAGNOSIS — R12 Heartburn: Secondary | ICD-10-CM

## 2012-03-21 DIAGNOSIS — R269 Unspecified abnormalities of gait and mobility: Secondary | ICD-10-CM

## 2012-03-21 DIAGNOSIS — R2681 Unsteadiness on feet: Secondary | ICD-10-CM

## 2012-03-21 DIAGNOSIS — R635 Abnormal weight gain: Secondary | ICD-10-CM

## 2012-03-21 DIAGNOSIS — K5900 Constipation, unspecified: Secondary | ICD-10-CM

## 2012-03-21 MED ORDER — BISACODYL 10 MG RE SUPP: RECTAL | Status: DC

## 2012-03-21 MED ORDER — BISACODYL 10 MG RE SUPP: 10.0000 mg | RECTAL | Status: DC | PRN

## 2012-03-21 MED ORDER — RANITIDINE HCL 150 MG PO TABS: 150.0000 mg | ORAL_TABLET | Freq: Every day | ORAL | Status: DC

## 2012-03-21 NOTE — Progress Notes (Signed)
  Subjective:    Patient ID: [name], female    DOB: 07-21-1925, 79 y.o.   MRN: [id number]  HPI Patient presents with burning sensation in her stomach on and off for the past couple weeks. She was seen at [facility] ED secondary to the burning sensation as well as cramping was given an enema because she had problems with constipation her symptoms improved but then returned. She still having problems with her bowels I reviewed her medication list she's only been given MiraLAX twice but she is given stool softener daily.   Review of Systems - per above    GEN- denies fatigue, fever, weight loss,weakness, recent illness HEENT- denies eye drainage, change in vision, nasal discharge, CVS- denies chest pain, palpitations RESP- denies SOB, cough, wheeze ABD- denies N/V, change in stools, +abd pain GU- denies dysuria, hematuria, dribbling, incontinence MSK- denies joint pain, muscle aches, injury Neuro- denies headache, dizziness, syncope, seizure activity      Objective:   Physical Exam GEN- NAD, alert and oriented x3 HEENT- PERRL, EOMI, non injected sclera, pink conjunctiva, MMM, oropharynx clear Neck- Supple, no LAD CVS- RRR, 3/6 murmur RESP-CTAB ABD-NABS,soft,NT,ND EXT- No edema Pulses- Radial, DP- 2+        Assessment & Plan:

## 2012-03-21 NOTE — Telephone Encounter (Signed)
Dr resending order now

## 2012-03-21 NOTE — Patient Instructions (Signed)
Start zantac for acid reflux Miralax daily Suppository prn D/C remeron F/U 3 months

## 2012-03-23 DIAGNOSIS — K219 Gastro-esophageal reflux disease without esophagitis: Secondary | ICD-10-CM | POA: Insufficient documentation

## 2012-03-23 NOTE — Assessment & Plan Note (Signed)
Will change miralax to daily instead of prn. Dulculax suppository will be ordered prn no BM 3 days to avoid ER visits for impaction and abdominal pain.

## 2012-03-23 NOTE — Assessment & Plan Note (Signed)
Will give trial of zantac for heartburn symptoms, negative exam

## 2012-03-23 NOTE — Assessment & Plan Note (Signed)
She is maintaining weight and appetite has improved, however she continues to gain making HD difficult, will d/c remeron

## 2012-03-23 NOTE — Assessment & Plan Note (Signed)
She is fairly ambulatory with walker assistance and does well with this, she request walker with seat as she fatigues easily

## 2012-04-03 ENCOUNTER — Other Ambulatory Visit: Payer: Self-pay

## 2012-04-03 MED ORDER — INSULIN PEN NEEDLE 31G X 8 MM MISC: Status: DC

## 2012-04-13 ENCOUNTER — Encounter: Payer: Self-pay | Admitting: Family Medicine

## 2012-04-13 ENCOUNTER — Ambulatory Visit (INDEPENDENT_AMBULATORY_CARE_PROVIDER_SITE_OTHER): Payer: Medicare HMO | Admitting: Family Medicine

## 2012-04-13 VITALS — BP 124/74 | HR 60 | Resp 16 | Wt 182.1 lb

## 2012-04-13 DIAGNOSIS — E118 Type 2 diabetes mellitus with unspecified complications: Secondary | ICD-10-CM

## 2012-04-13 NOTE — Patient Instructions (Signed)
D/C insulin F/U 3 months

## 2012-04-14 ENCOUNTER — Encounter: Payer: Self-pay | Admitting: Family Medicine

## 2012-04-14 NOTE — Assessment & Plan Note (Signed)
Last A1C was 6%, I have been decreasing her insulin, with new hypoglycemia, will have her d/c insulin all together and have facility check CBG

## 2012-04-14 NOTE — Progress Notes (Signed)
  Subjective:    Patient ID: [name], female    DOB: [birth year], 79 y.o.   MRN: [id number]  HPI  Patient here for interim visit although she was not suppose to bescheduled to be here. She has no specific concerns but states that her sugars have been let running low. Sugars presented with her medication reconciliation forms which needs to be signed off today show 100 fasting past 2 days, states her CBG was in 40's a couple of times  Review of Systems - per above  GEN- denies fatigue, fever, weight loss,weakness, recent illness HEENT- denies eye drainage, change in vision, nasal discharge, CVS- denies chest pain, palpitations RESP- denies SOB, cough, wheeze ABD- denies N/V, change in stools, abd pain GU- denies dysuria, hematuria, dribbling, incontinence MSK- denies joint pain, muscle aches, injury Neuro- denies headache, dizziness, syncope, seizure activity       Objective:   Physical Exam GEN- NAD, alert and oriented x3 CVS- RRR, 3/6 murmur RESP-CTAB Ext- No edema       Assessment & Plan:

## 2012-05-09 ENCOUNTER — Encounter: Payer: Self-pay | Admitting: *Deleted

## 2012-06-06 ENCOUNTER — Ambulatory Visit: Payer: Medicare HMO | Admitting: Family Medicine

## 2012-06-08 ENCOUNTER — Encounter: Payer: Self-pay | Admitting: Family Medicine

## 2012-06-08 ENCOUNTER — Ambulatory Visit (INDEPENDENT_AMBULATORY_CARE_PROVIDER_SITE_OTHER): Payer: Medicare HMO | Admitting: Family Medicine

## 2012-06-08 ENCOUNTER — Telehealth: Payer: Self-pay

## 2012-06-08 VITALS — BP 130/70 | HR 79 | Resp 16 | Wt 184.0 lb

## 2012-06-08 DIAGNOSIS — N186 End stage renal disease: Secondary | ICD-10-CM

## 2012-06-08 DIAGNOSIS — E039 Hypothyroidism, unspecified: Secondary | ICD-10-CM

## 2012-06-08 DIAGNOSIS — I10 Essential (primary) hypertension: Secondary | ICD-10-CM

## 2012-06-08 DIAGNOSIS — F32A Depression, unspecified: Secondary | ICD-10-CM

## 2012-06-08 DIAGNOSIS — N039 Chronic nephritic syndrome with unspecified morphologic changes: Secondary | ICD-10-CM

## 2012-06-08 DIAGNOSIS — E119 Type 2 diabetes mellitus without complications: Secondary | ICD-10-CM

## 2012-06-08 DIAGNOSIS — F329 Major depressive disorder, single episode, unspecified: Secondary | ICD-10-CM

## 2012-06-08 DIAGNOSIS — F3289 Other specified depressive episodes: Secondary | ICD-10-CM

## 2012-06-08 DIAGNOSIS — D631 Anemia in chronic kidney disease: Secondary | ICD-10-CM

## 2012-06-08 DIAGNOSIS — E785 Hyperlipidemia, unspecified: Secondary | ICD-10-CM

## 2012-06-08 DIAGNOSIS — E118 Type 2 diabetes mellitus with unspecified complications: Secondary | ICD-10-CM

## 2012-06-08 LAB — HEMOGLOBIN A1C
Hgb A1c MFr Bld: 5.9 % — ABNORMAL HIGH (ref <5.7)
Mean Plasma Glucose: 123 mg/dL — ABNORMAL HIGH (ref <117)

## 2012-06-08 LAB — T4: T4, Total: 10.3 ug/dL (ref 5.0–12.5)

## 2012-06-08 MED ORDER — CLOTRIMAZOLE-BETAMETHASONE 1-0.05 % EX CREA: TOPICAL_CREAM | CUTANEOUS | Status: AC

## 2012-06-08 NOTE — Assessment & Plan Note (Signed)
Blood pressure is well-controlled on current regimen and dialysis

## 2012-06-08 NOTE — Assessment & Plan Note (Signed)
Doing well on hemodialysis 

## 2012-06-08 NOTE — Progress Notes (Signed)
  Subjective:    Patient ID: [name], female    DOB: 1925-10-26, 79 y.o.   MRN: [id number]  HPI  Patient here follow chronic medical problems. I'm leaving the practice in [place] she's not sure if she will be able to transfer with me to [facility]. She has no specific concerns is doing well. She's been eating a steak for about a year now and has filled in nicely. She's currently on hemodialysis has not had any difficulties her dialysis days are Monday, Wednesday, Friday. Most recent visit her blood sugars have been fluctuating she had some hypoglycemia therefore insulin was stopped. She is due to have a repeat A1c. Her fasting blood sugars have been 108 to 150s. She has been maintaining her weight and her appetite is also been holding up despite discontinuing her Remeron.   Review of Systems  GEN- denies fatigue, fever, weight loss,weakness, recent illness HEENT- denies eye drainage, change in vision, nasal discharge, CVS- denies chest pain, palpitations RESP- denies SOB, cough, wheeze ABD- denies N/V, change in stools, abd pain GU- denies dysuria, hematuria, dribbling, incontinence MSK- denies joint pain, muscle aches, injury Neuro- denies headache, dizziness, syncope, seizure activity      Objective:   Physical Exam  GEN- NAD, alert and oriented x3, walks with walker HEENT- PERRL, EOMI, non injected sclera, pink conjunctiva, MMM, oropharynx clear Neck- Supple, no LAD CVS- RRR, 3/6 murmur RESP-CTAB ABD-NABS,soft,NT,ND EXT- No edema Pulses- Radial, DP- 2+ Diabetic foot- see below       Assessment & Plan:

## 2012-06-08 NOTE — Assessment & Plan Note (Signed)
Diet controlled no longer needs insulin. Recheck A1c was no insulin

## 2012-06-08 NOTE — Assessment & Plan Note (Signed)
Recheck thyroid studies today

## 2012-06-08 NOTE — Assessment & Plan Note (Signed)
Hemoglobin has been stable she is being followed by nephrology for this

## 2012-06-08 NOTE — Patient Instructions (Addendum)
Continue current medications Get the labs done today See [employer] papers

## 2012-06-08 NOTE — Assessment & Plan Note (Signed)
LDL is at goal

## 2012-06-08 NOTE — Assessment & Plan Note (Signed)
She's doing well on the Celexa we will continue this

## 2012-06-08 NOTE — Telephone Encounter (Signed)
[facility] called and wanted the ok to remove one of her teeth. They wanted a verbal ok since she was on dialysis. Per Dr Buelah Manis it was ok to proceed with the dental work

## 2012-06-13 ENCOUNTER — Other Ambulatory Visit: Payer: Self-pay | Admitting: Family Medicine

## 2012-06-13 MED ORDER — ALBUTEROL SULFATE HFA 108 (90 BASE) MCG/ACT IN AERS: 2.0000 | INHALATION_SPRAY | RESPIRATORY_TRACT | Status: DC | PRN

## 2012-06-19 DIAGNOSIS — R079 Chest pain, unspecified: Secondary | ICD-10-CM

## 2012-07-10 ENCOUNTER — Other Ambulatory Visit: Payer: Self-pay | Admitting: *Deleted

## 2012-07-10 ENCOUNTER — Encounter: Payer: Self-pay | Admitting: Vascular Surgery

## 2012-07-10 DIAGNOSIS — T82598A Other mechanical complication of other cardiac and vascular devices and implants, initial encounter: Secondary | ICD-10-CM

## 2012-07-11 ENCOUNTER — Ambulatory Visit (INDEPENDENT_AMBULATORY_CARE_PROVIDER_SITE_OTHER): Payer: Medicare Other | Admitting: Vascular Surgery

## 2012-07-11 ENCOUNTER — Encounter (INDEPENDENT_AMBULATORY_CARE_PROVIDER_SITE_OTHER): Payer: Medicare HMO | Admitting: Vascular Surgery

## 2012-07-11 ENCOUNTER — Encounter: Payer: Self-pay | Admitting: Vascular Surgery

## 2012-07-11 VITALS — BP 123/62 | HR 74 | Resp 16 | Ht 66.0 in | Wt 173.0 lb

## 2012-07-11 DIAGNOSIS — M79609 Pain in unspecified limb: Secondary | ICD-10-CM | POA: Insufficient documentation

## 2012-07-11 DIAGNOSIS — T82598A Other mechanical complication of other cardiac and vascular devices and implants, initial encounter: Secondary | ICD-10-CM

## 2012-07-11 DIAGNOSIS — N186 End stage renal disease: Secondary | ICD-10-CM

## 2012-07-11 NOTE — Progress Notes (Signed)
Subjective:     Patient ID: [name], female   DOB: [birth year], 79 y.o.   MRN: [id number]  HPI this 79 year old female with end-stage renal disease has a left upper extremity AV fistula which we created a few years ago. He has functioned nicely until last Friday when she developed a hematoma at the time of dialysis. The fistula was utilized again yesterday and worked well. Patient has had pain in the left upper extremity which has improved over the past 5 days. She has no pain or numbness in the left hand.  Past Medical History  Diagnosis Date  . Anxiety   . Diabetes mellitus, type 2   . Hypertension   . IBS (irritable bowel syndrome)     with costipation  . Overactive bladder   . Cataracts, both eyes   . Gynecologic cancer   . Hyperlipidemia   . Anemia in CKD (chronic kidney disease)   . SVT (supraventricular tachycardia)   . CHF (congestive heart failure)   . Arthritis     gout  . SOB (shortness of breath)     nml spirometry 09/05/06  . Stroke     residual right side weakness and pain  . Thyroid disease   . Depression   . GERD (gastroesophageal reflux disease)   . CKD (chronic kidney disease), stage V     on HD    History  Substance Use Topics  . Smoking status: Never Smoker   . Smokeless tobacco: Never Used  . Alcohol Use: No    History reviewed. No pertinent family history.  No Known Allergies  Current outpatient prescriptions:albuterol (PROVENTIL HFA;VENTOLIN HFA) 108 (90 BASE) MCG/ACT inhaler, Inhale 2 puffs into the lungs every 4 (four) hours as needed., Disp: 18 g, Rfl: 6;  amLODipine (NORVASC) 10 MG tablet, Take 1 tablet (10 mg total) by mouth every morning., Disp: 30 tablet, Rfl: 5;  ASPIRIN LOW DOSE 81 MG EC tablet, TAKE (1) TABLET BY MOUTH ONCE DAILY., Disp: 31 tablet, Rfl: 5 bisacodyl (DULCOLAX) 10 MG suppository, 1 suppository rectally  Daily every 3 days as needed for constipation, Disp: 12 suppository, Rfl: 2;  citalopram (CELEXA) 20 MG tablet, Take 1  tablet (20 mg total) by mouth every morning., Disp: 30 tablet, Rfl: 5;  clotrimazole-betamethasone (LOTRISONE) cream, Apply to affected area 2 times daily, Disp: 15 g, Rfl: 1 docusate sodium (COLACE) 100 MG capsule, Take 1 capsule (100 mg total) by mouth 2 (two) times daily., Disp: 60 capsule, Rfl: 5;  Insulin Pen Needle (UNIFINE PENTIPS) 31G X 8 MM MISC, USE AS DIRECTED TWICE DAILY., Disp: 100 each, Rfl: 3;  isosorbide mononitrate (IMDUR) 60 MG 24 hr tablet, Take 1 tablet (60 mg total) by mouth daily., Disp: 30 tablet, Rfl: 5 levothyroxine (SYNTHROID, LEVOTHROID) 25 MCG tablet, Take 1 tablet (25 mcg total) by mouth daily before breakfast., Disp: 30 tablet, Rfl: 5;  lidocaine-prilocaine (EMLA) cream, Apply topically as needed., Disp: , Rfl: ;  Multiple Vitamin (DAILY VITE) TABS, Take 1 tablet by mouth daily., Disp: , Rfl: ;  polyethylene glycol powder (MIRALAX) powder, Take 17 g by mouth daily. Hold for diarrhea, Disp: 527 g, Rfl: 6 ranitidine (ZANTAC) 150 MG tablet, Take 1 tablet (150 mg total) by mouth at bedtime., Disp: 30 tablet, Rfl: 6;  sevelamer (RENVELA) 800 MG tablet, 800 mg. One tab with each meal and twice daily with snacks, Disp: , Rfl: ;  simvastatin (ZOCOR) 10 MG tablet, Take 1 tablet (10 mg total) by mouth at  bedtime., Disp: 30 tablet, Rfl: 5;  sorbitol 70 % solution, Take 30 mLs by mouth daily as needed., Disp: , Rfl:  travoprost, benzalkonium, (TRAVATAN) 0.004 % ophthalmic solution, Place 1 drop into both eyes at bedtime., Disp: , Rfl: ;  hydrALAZINE (APRESOLINE) 100 MG tablet, Take 0.5 tablets (50 mg total) by mouth every 8 (eight) hours., Disp: 180 tablet, Rfl: 5  BP 123/62  Pulse 74  Resp 16  Ht 5\' 6"  (1.676 m)  Wt 173 lb (78.472 kg)  BMI 27.94 kg/m2  SpO2 100%  LMP 12/24/2010  Body mass index is 27.94 kg/(m^2).           Review of Systems walks with a walker. Unsteady gait denies chest pain. Does have dyspnea and used home oxygen     Objective:   Physical Exam blood  pressure 123 or 62 heart rate 74 respirations 16 General-elderly female in no apparent distress appears frail on nasal oxygen Lungs no rhonchi or wheezing Left upper extremity with brachial-cephalic fistula with excellent pulse and palpable thrill. Evidence of resolving hematoma in the mid upper arm with ecchymosis and skin. 2+ radial pulse palpable distally with well-perfused left hand.  Today I ordered a duplex scan of the AV fistula in the left upper extremity. The fistula is functioning nicely in the hematoma measures 1.5 x 3 cm and is visualized in the proximal left upper arm    Assessment:     Nicely functioning left brachial to cephalic A-V fistula with hematoma left arm due to 2 recent access for dialysis-fistula has been utilized since then yesterday with no difficulty    Plan:     Explained to family they will take 6-8 weeks for hematoma to resolve but the fistula can continue to be used  Patient return to see [place] on when necessary basis

## 2012-07-12 ENCOUNTER — Ambulatory Visit: Payer: Self-pay | Admitting: Family Medicine

## 2012-07-13 ENCOUNTER — Ambulatory Visit: Payer: Medicare HMO | Admitting: Family Medicine

## 2012-08-18 ENCOUNTER — Telehealth: Payer: Self-pay | Admitting: Family Medicine

## 2012-08-18 MED ORDER — GLUCOSE BLOOD VI STRP: ORAL_STRIP | Status: DC

## 2012-08-18 NOTE — Telephone Encounter (Signed)
Med refilled.

## 2012-09-04 ENCOUNTER — Telehealth: Payer: Self-pay | Admitting: Family Medicine

## 2012-09-04 MED ORDER — ASPIRIN 81 MG PO TBEC: DELAYED_RELEASE_TABLET | ORAL | Status: DC

## 2012-09-04 NOTE — Telephone Encounter (Signed)
Rx Refilled  

## 2012-09-04 NOTE — Telephone Encounter (Signed)
Aspirin81 MG TAB 1 qd #31

## 2012-10-31 ENCOUNTER — Other Ambulatory Visit: Payer: Self-pay | Admitting: *Deleted

## 2012-10-31 DIAGNOSIS — T82598A Other mechanical complication of other cardiac and vascular devices and implants, initial encounter: Secondary | ICD-10-CM

## 2012-11-13 ENCOUNTER — Encounter: Payer: Self-pay | Admitting: Vascular Surgery

## 2012-11-14 ENCOUNTER — Encounter (INDEPENDENT_AMBULATORY_CARE_PROVIDER_SITE_OTHER): Payer: Self-pay

## 2012-11-14 ENCOUNTER — Ambulatory Visit (INDEPENDENT_AMBULATORY_CARE_PROVIDER_SITE_OTHER): Payer: Medicare HMO | Admitting: Vascular Surgery

## 2012-11-14 ENCOUNTER — Encounter: Payer: Self-pay | Admitting: Vascular Surgery

## 2012-11-14 ENCOUNTER — Ambulatory Visit (HOSPITAL_COMMUNITY)
Admission: RE | Admit: 2012-11-14 | Discharge: 2012-11-14 | Disposition: A | Discharge status: Home / Self Care | Payer: Medicare HMO | Source: Ambulatory Visit | Attending: Vascular Surgery | Admitting: Vascular Surgery

## 2012-11-14 VITALS — BP 152/66 | HR 72 | Ht 66.0 in | Wt 171.0 lb

## 2012-11-14 DIAGNOSIS — T82598A Other mechanical complication of other cardiac and vascular devices and implants, initial encounter: Secondary | ICD-10-CM | POA: Insufficient documentation

## 2012-11-14 DIAGNOSIS — Y849 Medical procedure, unspecified as the cause of abnormal reaction of the patient, or of later complication, without mention of misadventure at the time of the procedure: Secondary | ICD-10-CM | POA: Insufficient documentation

## 2012-11-14 DIAGNOSIS — N186 End stage renal disease: Secondary | ICD-10-CM

## 2012-11-14 NOTE — Progress Notes (Signed)
The patient presents today for evaluation of her left upper arm AV fistula. This was initially placed by Dr. Bridgett Wood in May of 2012. She's had good functioning of this over a time period she has had recent episodes of prolonged bleeding after access for hemodialysis fistula. She has no history of skin breakdown. She does have a good flow through the fistula there is been no indication of high outflow or poor flow.  Past Medical History  Diagnosis Date  . Anxiety   . Diabetes mellitus, type 2   . Hypertension   . IBS (irritable bowel syndrome)     with costipation  . Overactive bladder   . Cataracts, both eyes   . Gynecologic cancer   . Hyperlipidemia   . Anemia in CKD (chronic kidney disease)   . SVT (supraventricular tachycardia)   . CHF (congestive heart failure)   . Arthritis     gout  . SOB (shortness of breath)     nml spirometry 09/05/06  . Stroke     residual right side weakness and pain  . Thyroid disease   . Depression   . GERD (gastroesophageal reflux disease)   . CKD (chronic kidney disease), stage V     on HD    History  Substance Use Topics  . Smoking status: Never Smoker   . Smokeless tobacco: Never Used  . Alcohol Use: No    History reviewed. No pertinent family history.  No Known Allergies  Current outpatient prescriptions:acetaminophen (TYLENOL) 325 MG tablet, Take 650 mg by mouth every 6 (six) hours as needed., Disp: , Rfl: ;  aspirin (ASPIRIN LOW DOSE) 81 MG EC tablet, TAKE (1) TABLET BY MOUTH ONCE DAILY., Disp: 31 tablet, Rfl: 10;  citalopram (CELEXA) 20 MG tablet, Take 1 tablet (20 mg total) by mouth every morning., Disp: 30 tablet, Rfl: 5 clotrimazole-betamethasone (LOTRISONE) cream, Apply to affected area 2 times daily, Disp: 15 g, Rfl: 1;  docusate sodium (COLACE) 100 MG capsule, Take 1 capsule (100 mg total) by mouth 2 (two) times daily., Disp: 60 capsule, Rfl: 5;  HYDROcodone-acetaminophen (NORCO/VICODIN) 5-325 MG per tablet, Take 1 tablet by mouth every  6 (six) hours as needed for moderate pain., Disp: , Rfl:  ipratropium (ATROVENT) 0.02 % nebulizer solution, Take 0.5 mg by nebulization 3 (three) times daily., Disp: , Rfl: ;  lidocaine-prilocaine (EMLA) cream, Apply topically as needed., Disp: , Rfl: ;  Multiple Vitamin (DAILY VITE) TABS, Take 1 tablet by mouth daily., Disp: , Rfl: ;  Polyethyl Glycol-Propyl Glycol (SYSTANE OP), Apply 1 drop to eye 3 (three) times daily., Disp: , Rfl:  polyethylene glycol powder (MIRALAX) powder, Take 17 g by mouth daily. Hold for diarrhea, Disp: 527 g, Rfl: 6;  ranitidine (ZANTAC) 150 MG tablet, Take 1 tablet (150 mg total) by mouth at bedtime., Disp: 30 tablet, Rfl: 6;  sevelamer (RENVELA) 800 MG tablet, 800 mg. One tab with each meal and twice daily with snacks, Disp: , Rfl:  travoprost, benzalkonium, (TRAVATAN) 0.004 % ophthalmic solution, Place 1 drop into both eyes at bedtime., Disp: , Rfl: ;  albuterol (PROVENTIL HFA;VENTOLIN HFA) 108 (90 BASE) MCG/ACT inhaler, Inhale 2 puffs into the lungs every 4 (four) hours as needed., Disp: 18 g, Rfl: 6;  amLODipine (NORVASC) 10 MG tablet, Take 1 tablet (10 mg total) by mouth every morning., Disp: 30 tablet, Rfl: 5 bisacodyl (DULCOLAX) 10 MG suppository, 1 suppository rectally  Daily every 3 days as needed for constipation, Disp: 12 suppository, Rfl: 2;  glucose blood test strip, Use as instructed, Disp: 50 each, Rfl: 1;  hydrALAZINE (APRESOLINE) 100 MG tablet, Take 0.5 tablets (50 mg total) by mouth every 8 (eight) hours., Disp: 180 tablet, Rfl: 5;  Insulin Pen Needle (UNIFINE PENTIPS) 31G X 8 MM MISC, USE AS DIRECTED TWICE DAILY., Disp: 100 each, Rfl: 3 isosorbide mononitrate (IMDUR) 60 MG 24 hr tablet, Take 1 tablet (60 mg total) by mouth daily., Disp: 30 tablet, Rfl: 5;  levothyroxine (SYNTHROID, LEVOTHROID) 25 MCG tablet, Take 1 tablet (25 mcg total) by mouth daily before breakfast., Disp: 30 tablet, Rfl: 5;  simvastatin (ZOCOR) 10 MG tablet, Take 1 tablet (10 mg total) by  mouth at bedtime., Disp: 30 tablet, Rfl: 5;  sorbitol 70 % solution, Take 30 mLs by mouth daily as needed., Disp: , Rfl:   BP 152/66  Pulse 72  Ht 5\' 6"  (1.676 m)  Wt 171 lb (77.565 kg)  BMI 27.61 kg/m2  SpO2 100%  LMP 12/24/2010  Body mass index is 27.61 kg/(m^2).       On physical exam a well-developed female appearing stated age 79. She is very hard of hearing but in no distress. 2+ right and left radial pulses She is a very with nicely matured upper arm brachiocephalic fistula. Areas no evidence of skin breakdown or ulceration. She does have a good thrill and good bruit  Duplex today shows good flow throughout her fistula. There is some mildly elevated velocities at the level of tortuosity but no evidence of stenosis.  Impression and plan no evidence of stenosis to account for high pressures causing prolonged bleeding. She does report that there is a discrepancy between issues with bleeding from one technologist to another. I would not recommend shuntogram at this time with normal appearing duplex. Patient is here with her daughter today they were reassured this discussion will see [place] on an as-needed basis

## 2012-11-28 ENCOUNTER — Other Ambulatory Visit (HOSPITAL_COMMUNITY): Payer: Medicare HMO

## 2012-11-28 ENCOUNTER — Ambulatory Visit: Payer: Medicare HMO | Admitting: Vascular Surgery

## 2013-01-13 IMAGING — CR DG FOOT COMPLETE 3+V*R*
3 series · 3 of 3 positions shown · non-contrast
Comparison: None

CLINICAL DATA: Right foot lower leg pain, no known injury

RIGHT FOOT COMPLETE - 3+ VIEW

[view not recorded (1 of 3)]
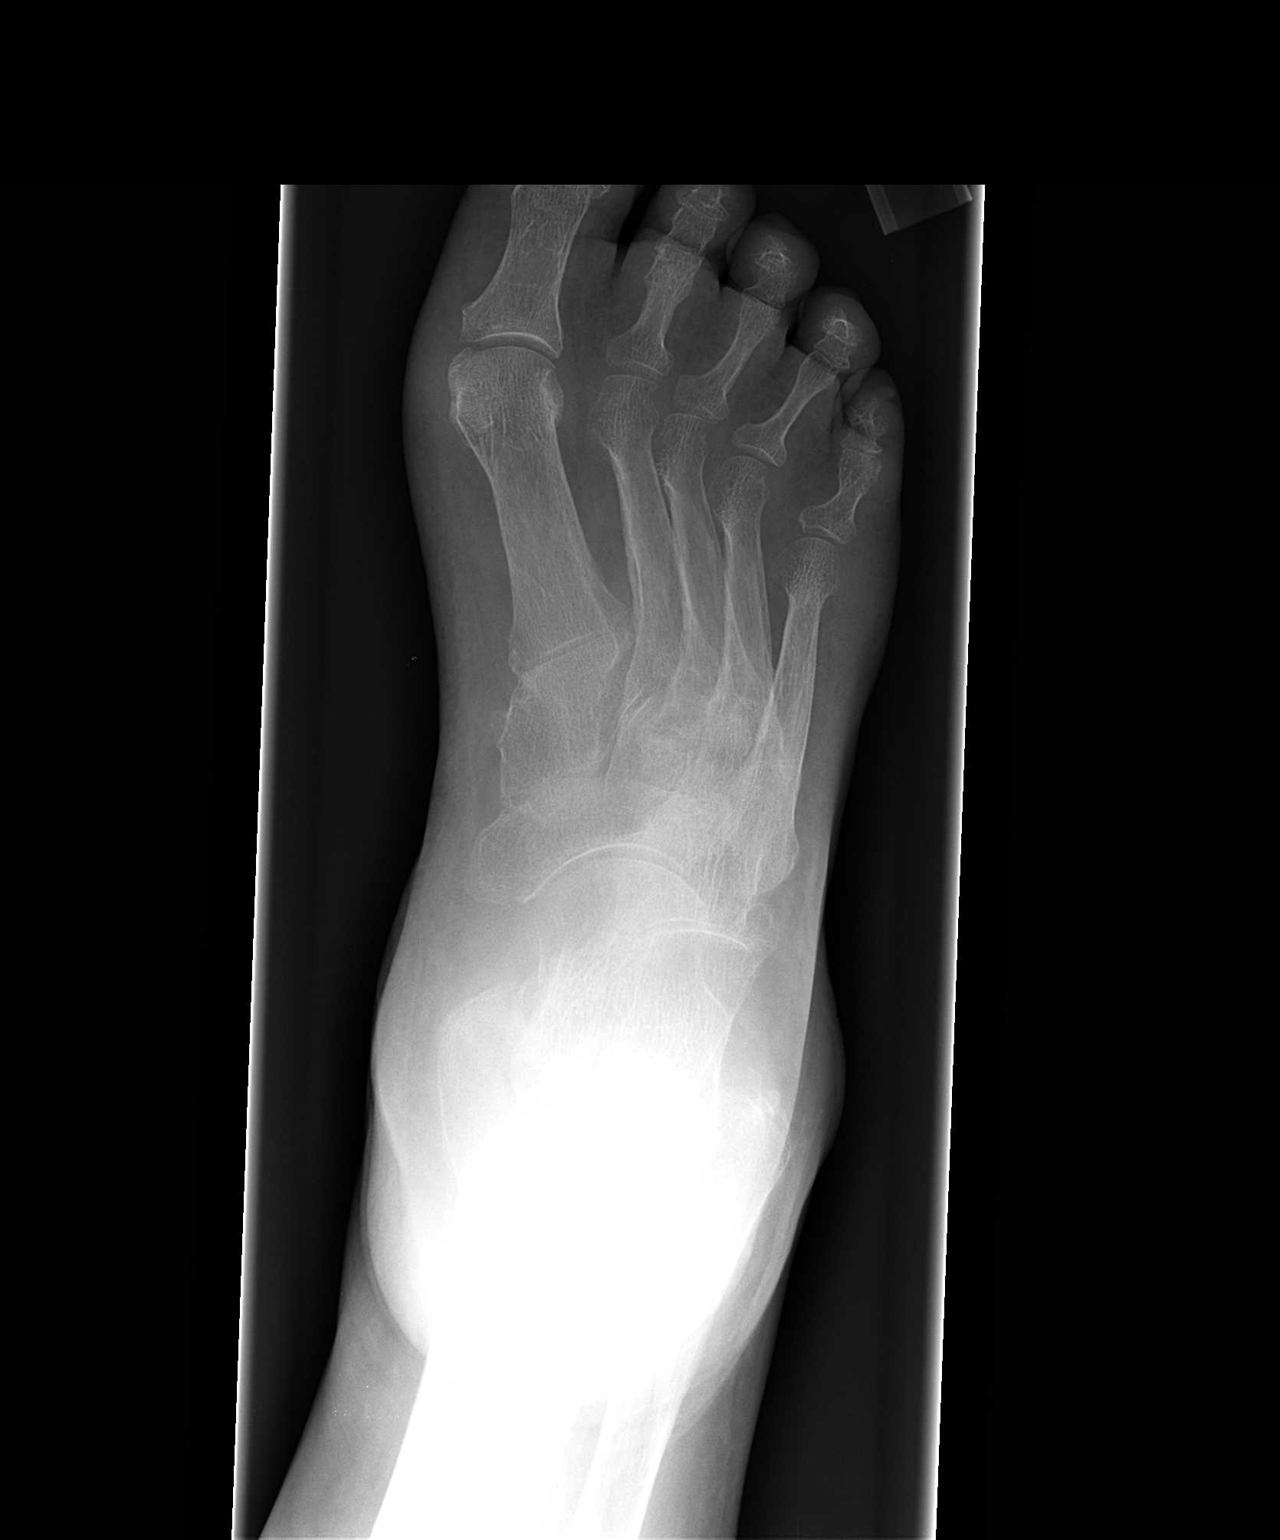

[view not recorded (2 of 3)]
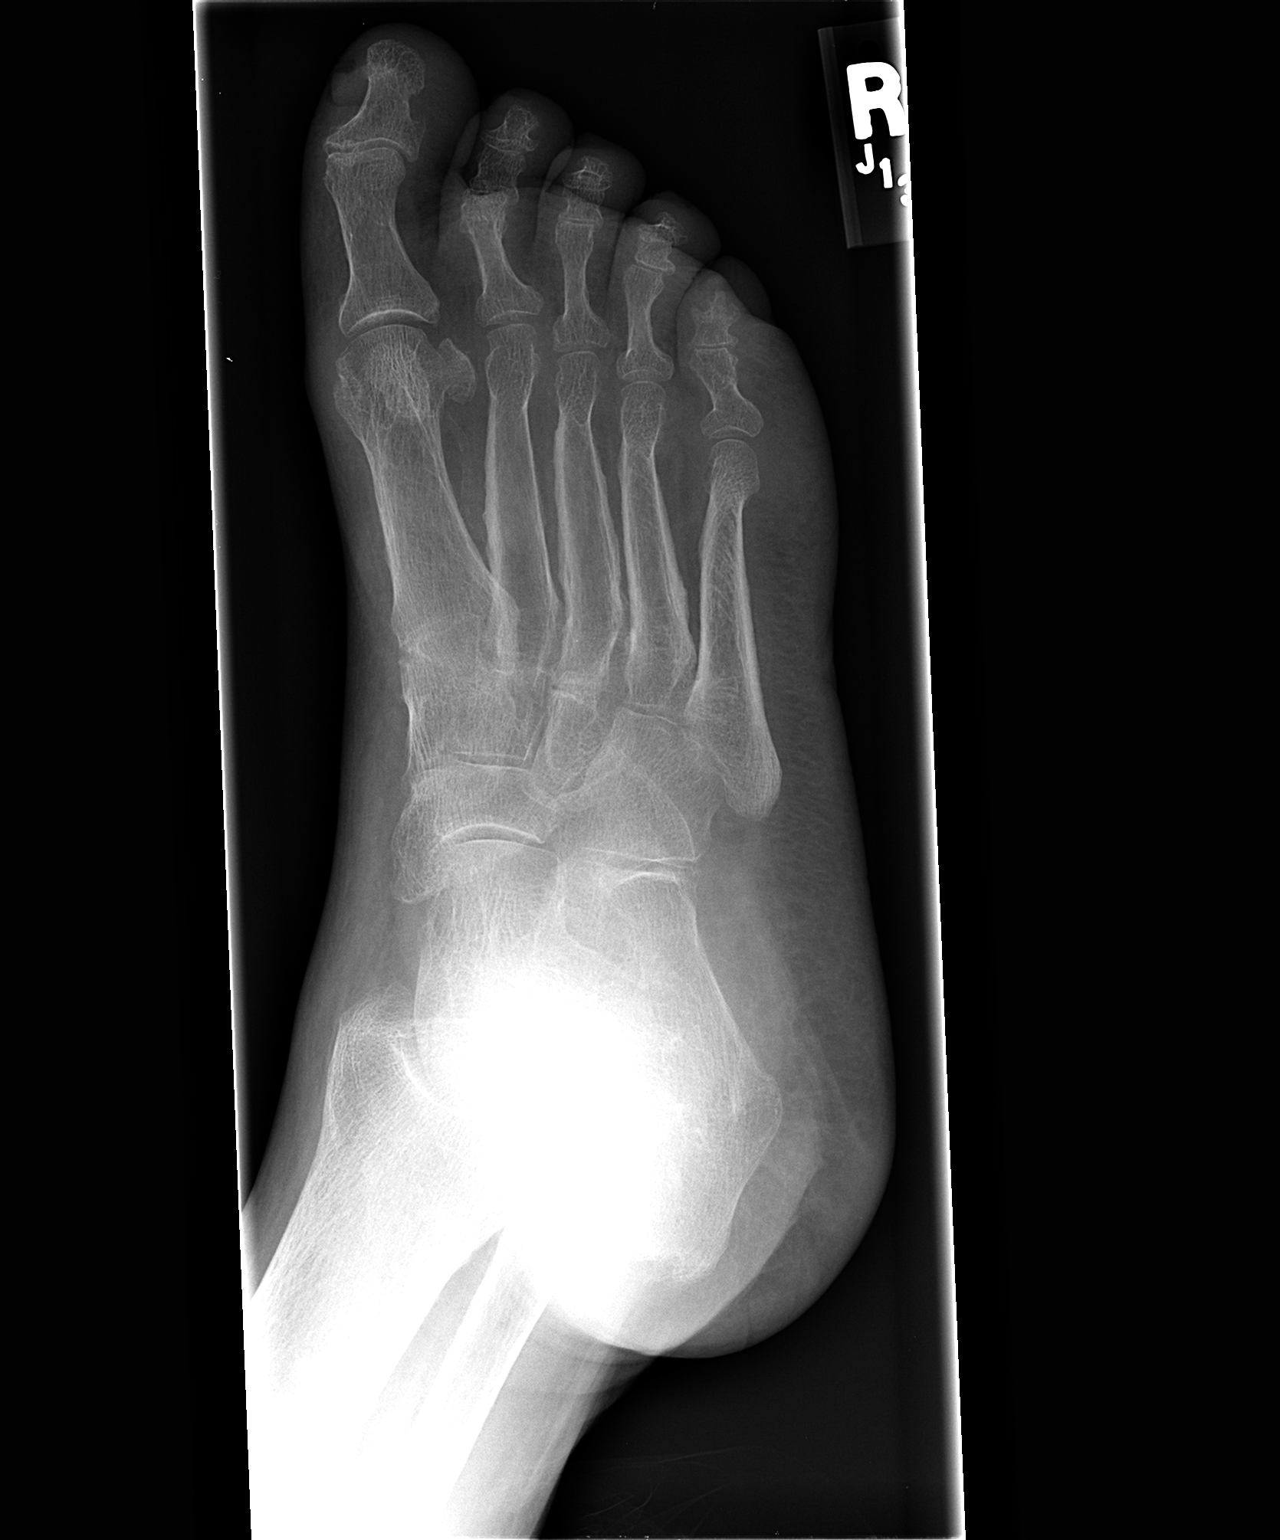

[view not recorded (3 of 3)]
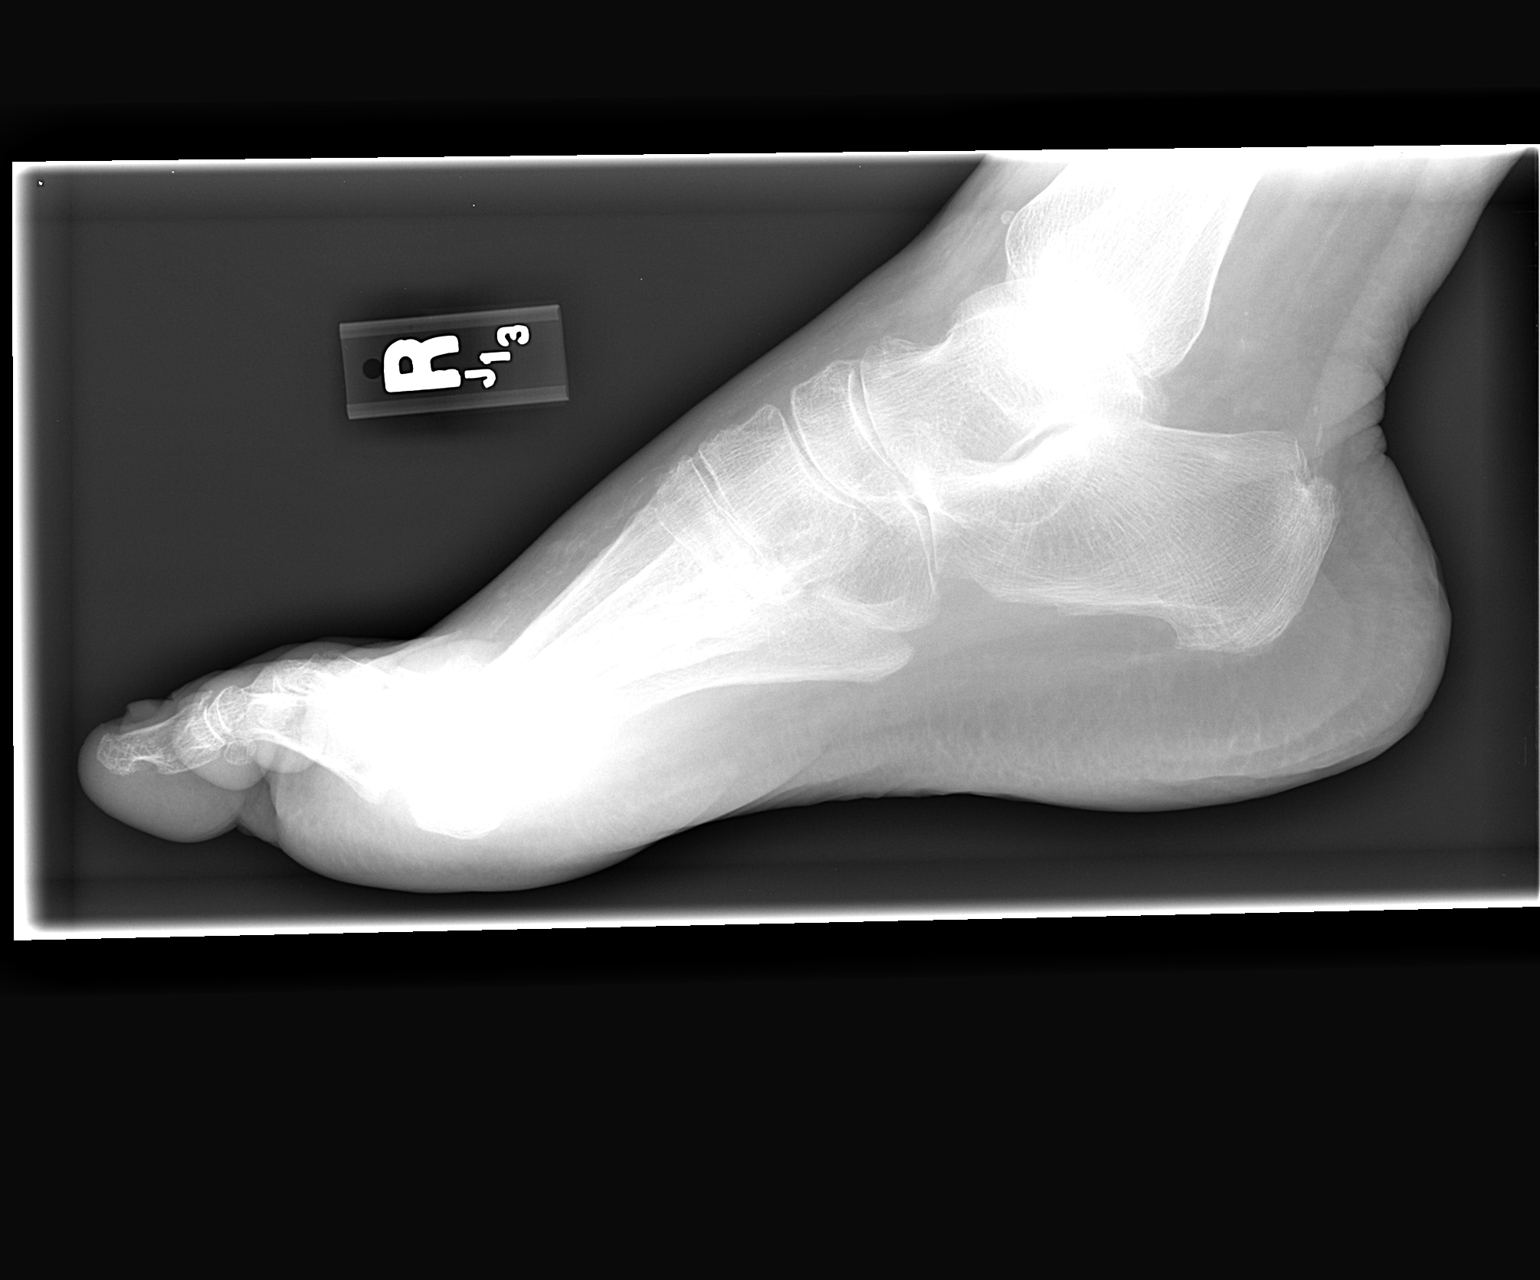

[3 of 3 positions shown; findings below may reference images not displayed]

FINDINGS: Marked osseous demineralization.
Mild hallux valgus.
Bony hypertrophy posterior calcaneus at Achilles insertion.
Scattered small vessel vascular calcification, question related to
diabetes.
No acute fracture, dislocation or bone destruction seen.
IMPRESSION: Significant osseous demineralization.
No definite acute bony findings.

## 2013-01-15 IMAGING — US US RENAL PORT
1 series · 14 of 25 positions shown · non-contrast
Comparison: 06/11/2007

CLINICAL DATA: Elevated renal function studies - evaluate for
obstruction

RENAL/URINARY TRACT ULTRASOUND COMPLETE

[Series 1: us renal port · 0.30mm/px · 14 of 36 slices shown]
[im 1/36]
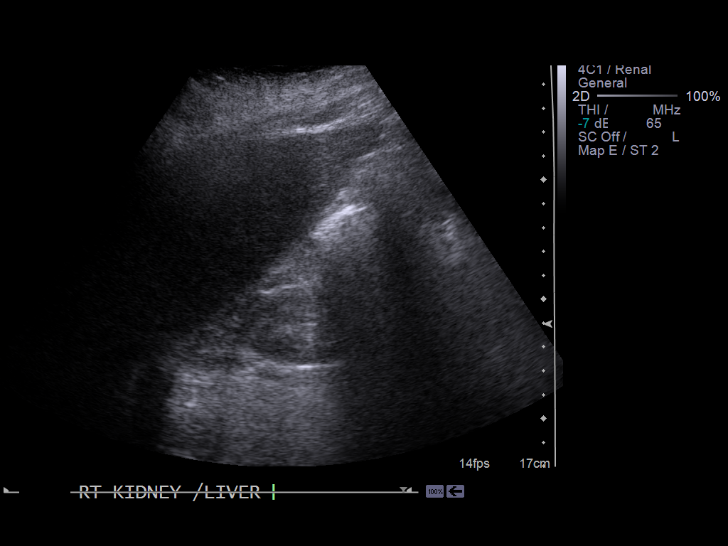
[im 3/36]
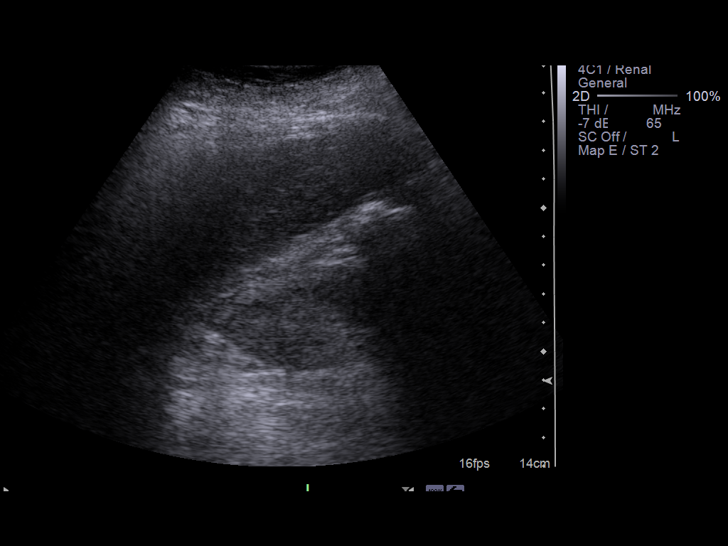
[im 6/36]
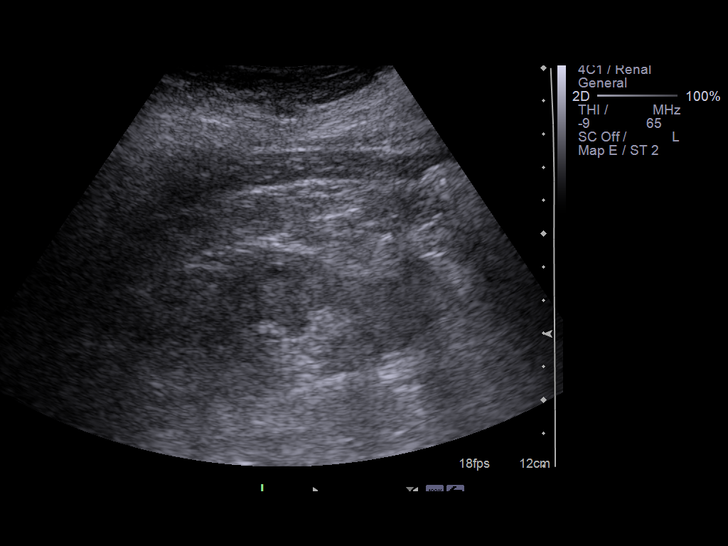
[im 9/36]
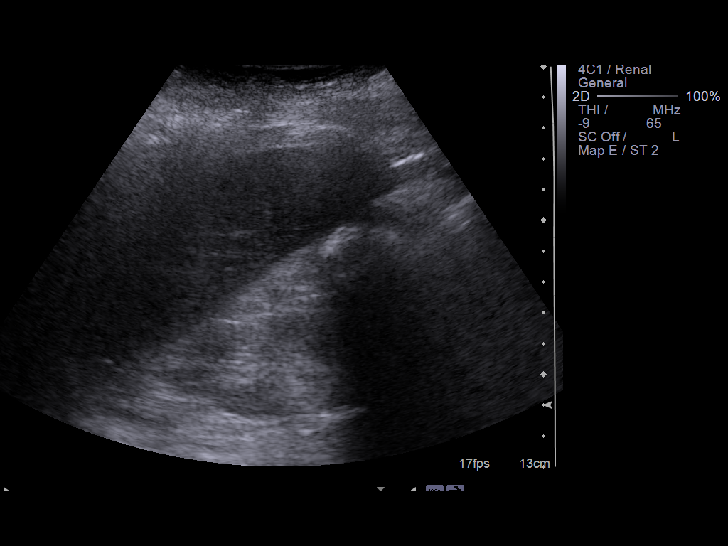
[im 12/36]
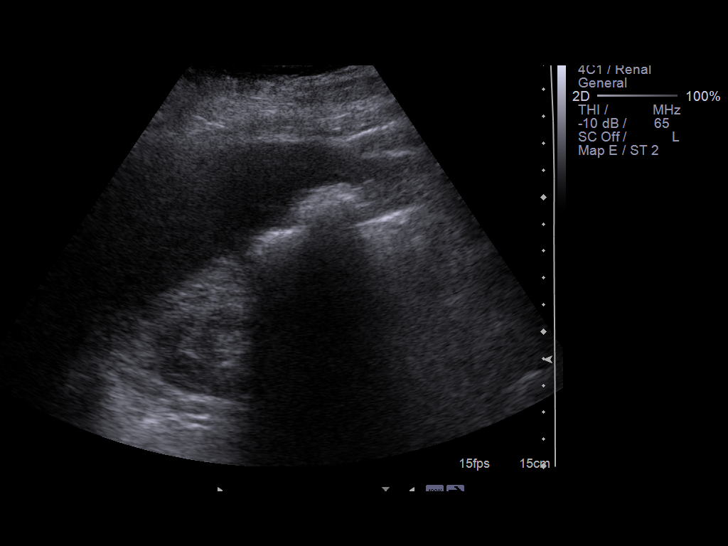
[im 14/36]
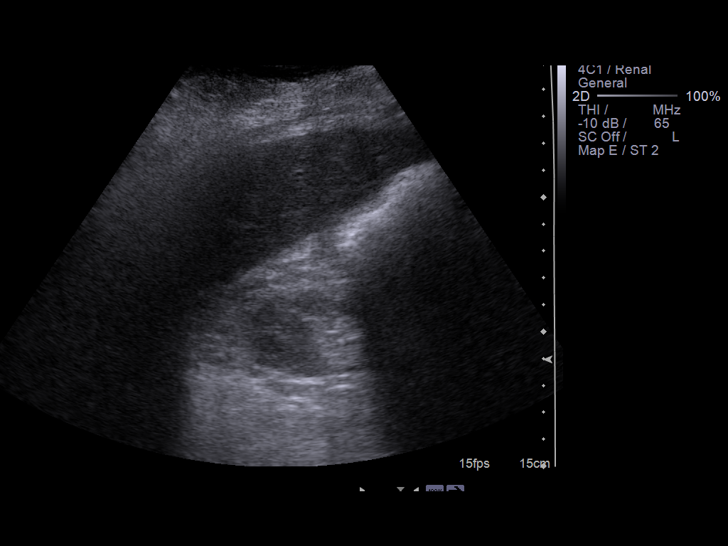
[im 17/36]
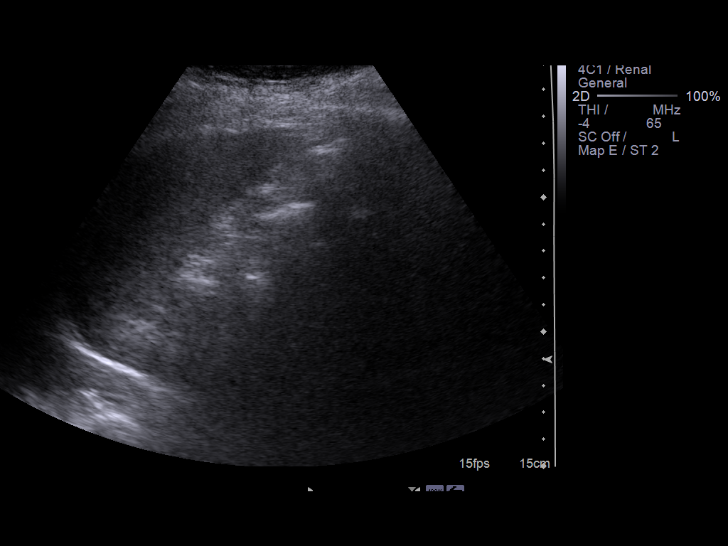
[im 19/36]
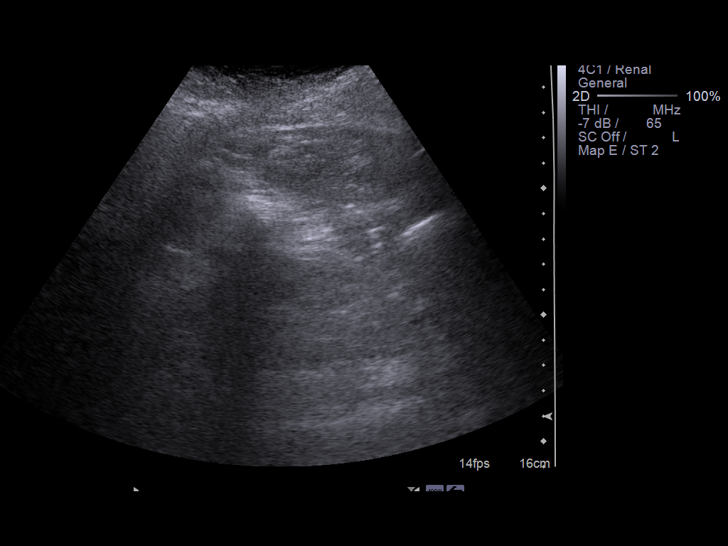
[im 22/36]
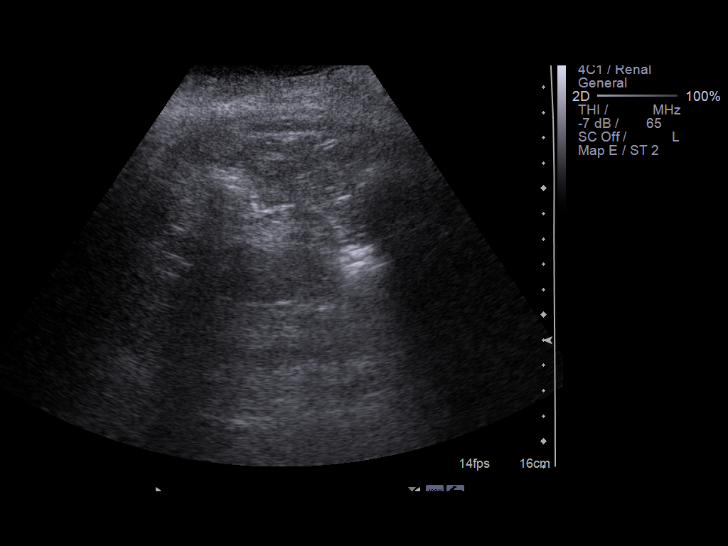
[im 24/36]
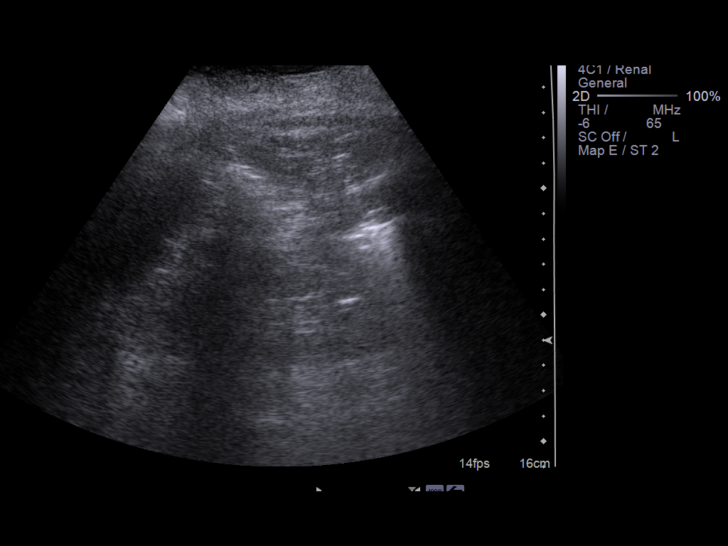
[im 27/36]
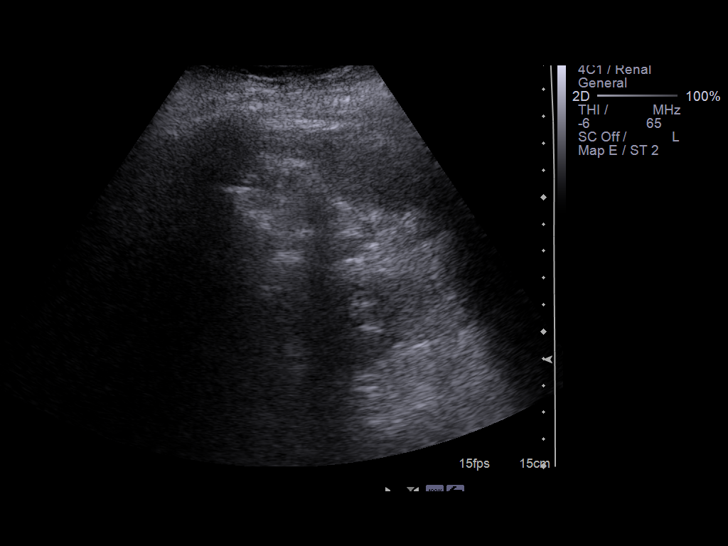
[im 30/36]
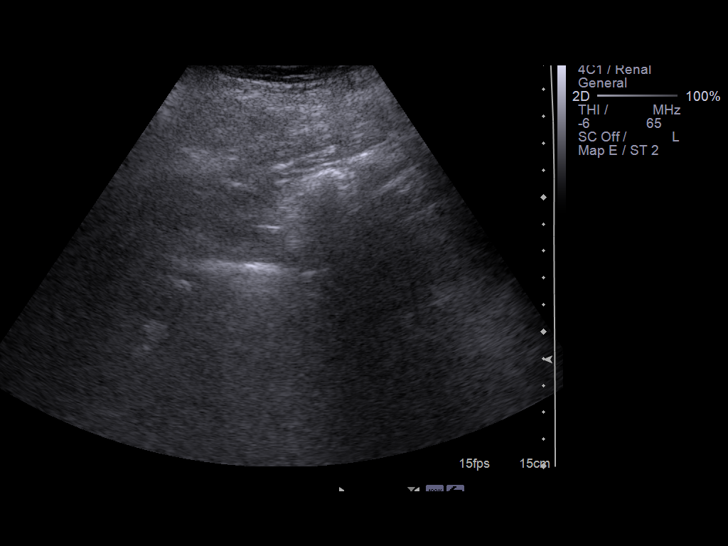
[im 33/36]
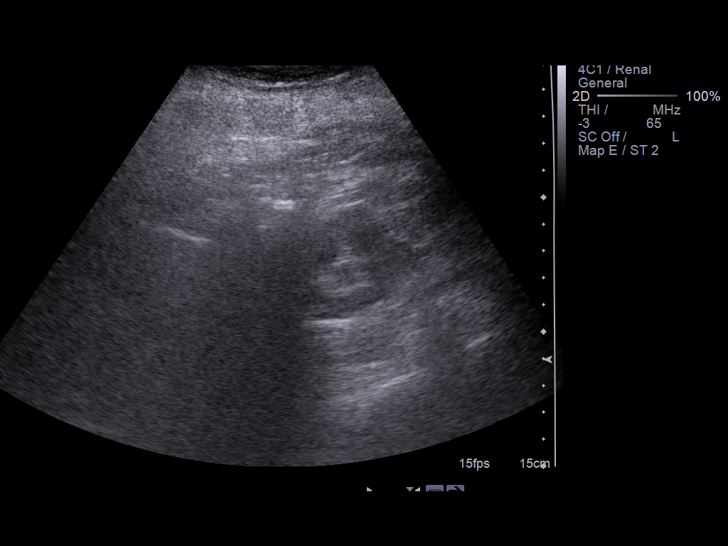
[im 36/36]
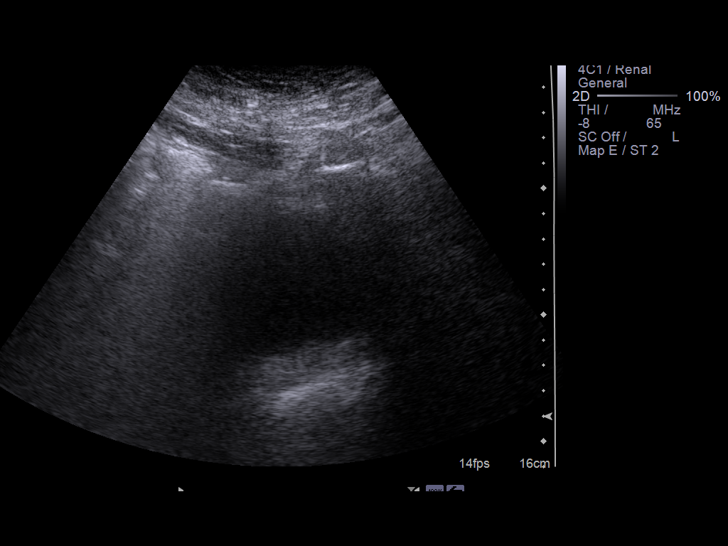

[14 of 25 positions shown; findings below may reference images not displayed]

FINDINGS: Right Kidney:  8.9 cm in sagittal length.  Increased echogenicity
of the cortex is noted without hydronephrosis or parenchymal
lesions.

Left Kidney:  8.7 cm in length.  Echogenic cortex without
obstruction or other pathology.

Bladder:  Unremarkable
IMPRESSION: Relatively small kidneys with diffuse increased echogenicity
consistent with medical renal disease.  No evidence for urinary
tract obstruction.

## 2013-03-29 ENCOUNTER — Other Ambulatory Visit: Payer: Medicare HMO

## 2013-03-29 ENCOUNTER — Other Ambulatory Visit: Payer: Self-pay | Admitting: *Deleted

## 2013-03-29 DIAGNOSIS — Z0000 Encounter for general adult medical examination without abnormal findings: Secondary | ICD-10-CM

## 2013-03-29 DIAGNOSIS — E119 Type 2 diabetes mellitus without complications: Secondary | ICD-10-CM

## 2013-03-29 LAB — CBC WITH DIFFERENTIAL/PLATELET
Basophils Absolute: 0 10*3/uL (ref 0.0–0.1)
Basophils Relative: 0 % (ref 0–1)
EOS PCT: 1 % (ref 0–5)
Eosinophils Absolute: 0.1 10*3/uL (ref 0.0–0.7)
HCT: 33.9 % — ABNORMAL LOW (ref 36.0–46.0)
Hemoglobin: 11.1 g/dL — ABNORMAL LOW (ref 12.0–15.0)
LYMPHS ABS: 1.7 10*3/uL (ref 0.7–4.0)
LYMPHS PCT: 30 % (ref 12–46)
MCH: 25.2 pg — AB (ref 26.0–34.0)
MCHC: 32.7 g/dL (ref 30.0–36.0)
MCV: 76.9 fL — AB (ref 78.0–100.0)
MONO ABS: 0.6 10*3/uL (ref 0.1–1.0)
Monocytes Relative: 11 % (ref 3–12)
Neutro Abs: 3.4 10*3/uL (ref 1.7–7.7)
Neutrophils Relative %: 58 % (ref 43–77)
Platelets: 197 10*3/uL (ref 150–400)
RBC: 4.41 MIL/uL (ref 3.87–5.11)
RDW: 14.9 % (ref 11.5–15.5)
WBC: 5.8 10*3/uL (ref 4.0–10.5)

## 2013-03-29 LAB — COMPLETE METABOLIC PANEL WITH GFR
ALBUMIN: 3.7 g/dL (ref 3.5–5.2)
ALK PHOS: 91 U/L (ref 39–117)
ALT: <8 U/L (ref 0–35)
AST: 14 U/L (ref 0–37)
BILIRUBIN TOTAL: 0.5 mg/dL (ref 0.2–1.2)
BUN: 35 mg/dL — AB (ref 6–23)
CO2: 29 mEq/L (ref 19–32)
CREATININE: 4.46 mg/dL — AB (ref 0.50–1.10)
Calcium: 9.3 mg/dL (ref 8.4–10.5)
Chloride: 97 mEq/L (ref 96–112)
GFR, EST NON AFRICAN AMERICAN: 8 mL/min — AB
GFR, Est African American: 10 mL/min — ABNORMAL LOW
GLUCOSE: 90 mg/dL (ref 70–99)
Potassium: 4.9 mEq/L (ref 3.5–5.3)
Sodium: 136 mEq/L (ref 135–145)
Total Protein: 6.2 g/dL (ref 6.0–8.3)

## 2013-03-29 LAB — HEMOGLOBIN A1C
Hgb A1c MFr Bld: 5.5 % (ref <5.7)
Mean Plasma Glucose: 111 mg/dL (ref <117)

## 2013-03-29 LAB — LIPID PANEL
CHOLESTEROL: 128 mg/dL (ref 0–200)
HDL: 50 mg/dL (ref >39)
LDL CALC: 66 mg/dL (ref 0–99)
TRIGLYCERIDES: 58 mg/dL (ref <150)
Total CHOL/HDL Ratio: 2.6 Ratio
VLDL: 12 mg/dL (ref 0–40)

## 2013-03-29 LAB — TSH: TSH: 1.006 u[IU]/mL (ref 0.350–4.500)

## 2013-04-03 ENCOUNTER — Encounter: Payer: Self-pay | Admitting: Family Medicine

## 2013-04-03 ENCOUNTER — Ambulatory Visit (INDEPENDENT_AMBULATORY_CARE_PROVIDER_SITE_OTHER): Payer: Medicare HMO | Admitting: Family Medicine

## 2013-04-03 VITALS — BP 128/62 | HR 78 | Temp 98.1°F | Resp 14 | Ht 64.0 in | Wt 173.0 lb

## 2013-04-03 DIAGNOSIS — N185 Chronic kidney disease, stage 5: Secondary | ICD-10-CM

## 2013-04-03 DIAGNOSIS — N186 End stage renal disease: Secondary | ICD-10-CM

## 2013-04-03 DIAGNOSIS — I509 Heart failure, unspecified: Secondary | ICD-10-CM

## 2013-04-03 DIAGNOSIS — E118 Type 2 diabetes mellitus with unspecified complications: Secondary | ICD-10-CM

## 2013-04-03 DIAGNOSIS — F329 Major depressive disorder, single episode, unspecified: Secondary | ICD-10-CM

## 2013-04-03 DIAGNOSIS — M129 Arthropathy, unspecified: Secondary | ICD-10-CM

## 2013-04-03 DIAGNOSIS — I1311 Hypertensive heart and chronic kidney disease without heart failure, with stage 5 chronic kidney disease, or end stage renal disease: Secondary | ICD-10-CM

## 2013-04-03 DIAGNOSIS — I5032 Chronic diastolic (congestive) heart failure: Secondary | ICD-10-CM

## 2013-04-03 NOTE — Patient Instructions (Signed)
Release of records- [facility] Release of records-Dr. Eulogio Ditch for past year only Release for [place] and discharge summary F/U 3 months

## 2013-04-03 NOTE — Progress Notes (Signed)
Patient ID: [name], female   DOB: 09/09/1925, 80 y.o.   MRN: [id number] Subjective:   Patient presents for Medicare Annual/Subsequent preventive examination.  Patient here with her daughter [name] for Medicare wellness check. She currently resides in [facility]. She was last seen about a year However due to transportation she was unable to come to my new location. She was followed by the physician at the facility for some time. I do not have any records from any recent labs or medication changes. She was hospitalized about a month ago secondary to a mini stroke there were no acute changes. She does continue on hemodialysis for end-stage renal disease and states that she is doing well with this. Her appetite continues to be a problem if she does not like the food at the facility and she is now being supplemented with protein bars by nephrology.  Her daughter states that she is still very upset and a little depressed about being in the facility and wanted to know if there is a medication she can take. She has actually are ready on Celexa as an antidepressant I am not sure if this is been changed recently  She states that she was given her pneumonia vaccine in the facility  Review Past Medical/Family/Social: per EMR   Risk Factors  Current exercise habits: very little Dietary issues discussed: Yes  Cardiac risk factors: ESRD, HTN DM.   Depression Screen - SEE FALL AND DEPRESSION SCEEN  Activities of Daily Living  In your present state of health, do you have any difficulty performing the following activities?:  Driving? yes Managing money? No  Feeding yourself? No  Getting from bed to chair? yes Climbing a flight of stairs? YES Preparing food and eating?: yes Bathing or showering? YES Getting dressed: No Getting to the toilet? No  Using the toilet:No  Moving around from place to place: yes  In the past year have you fallen or had a near fall?:YES Are you sexually active? No  Do  you have more than one partner? No   Hearing Difficulties: No  Do you often ask people to speak up or repeat themselves? yes Do you experience ringing or noises in your ears? No Do you have difficulty understanding soft or whispered voices? No  Do you feel that you have a problem with memory? No Do you often misplace items? No  Do you feel safe at home? Yes  Cognitive Testing  Alert? Yes Normal Appearance?Yes  Oriented to person? Yes Place? Yes  Time? Yes  Recall of three objects? Yes  Can perform simple calculations? Yes  Displays appropriate judgment?Yes  Can read the correct time from a watch face?Yes   List the Names of Other Physician/Practitioners you currently use: Dr. Hinda Lenis     Screening Tests / Date - SEE EMR Colonoscopy                     Zostavax  Mammogram  Influenza Vaccine    GEN- denies fatigue, fever, weight loss,weakness, recent illness HEENT- denies eye drainage, change in vision, nasal discharge, CVS- denies chest pain, palpitations RESP- denies SOB, cough, wheeze ABD- denies N/V, change in stools, abd pain GU- denies dysuria, hematuria, dribbling, incontinence MSK- denies joint pain, muscle aches, injury Neuro- denies headache, dizziness, syncope, seizure activity      GEN- NAD, alert and oriented x3, walks with rollator HEENT- PERRL, EOMI, non injected sclera, pink conjunctiva, MMM, oropharynx clear Neck- Supple, no LAD CVS-  RRR, 3/6 murmur ,graft LUE +bruit RESP-CTAB ABD-NABS,soft,NT,ND EXT- No edema Pulses- Radial, DP- 2+ Psych- normal affect and mood, very pleasant, normal speech, good eye contact  Assessment:    Annual wellness medicare exam   Plan:    During the course of the visit the patient was educated and counseled about appropriate screening and preventive services including:  Needs PRevnar 13 if not done  Screen + for depression. PHQ- 9 score of 12 (moderate depression). Currently on therapy Diet review for nutrition  referral? Yes ____ Not Indicated __x__  Patient Instructions (the written plan) was given to the patient.  Medicare Attestation  I have personally reviewed:  The patient's medical and social history  Their use of alcohol, tobacco or illicit drugs  Their current medications and supplements  The patient's functional ability including ADLs,fall risks, home safety risks, cognitive, and hearing and visual impairment  Diet and physical activities  Evidence for depression or mood disorders  The patient's weight, height, BMI, and visual acuity have been recorded in the chart. I have made referrals, counseling, and provided education to the patient based on review of the above and I have provided the patient with a written personalized care plan for preventive services.

## 2013-04-04 ENCOUNTER — Other Ambulatory Visit: Payer: Self-pay | Admitting: Family Medicine

## 2013-04-05 ENCOUNTER — Encounter: Payer: Self-pay | Admitting: Family Medicine

## 2013-04-05 DIAGNOSIS — F329 Major depressive disorder, single episode, unspecified: Secondary | ICD-10-CM | POA: Insufficient documentation

## 2013-04-05 NOTE — Assessment & Plan Note (Signed)
On hemodialysis obtained last of labs and records

## 2013-04-05 NOTE — Assessment & Plan Note (Signed)
Blood sugars are diet controlled her fasting sugars at he states 95-111 which looked very good

## 2013-04-05 NOTE — Assessment & Plan Note (Signed)
Well compensated

## 2013-04-05 NOTE — Assessment & Plan Note (Signed)
Blood pressure is well-controlled on current medications also followed by nephrology

## 2013-04-05 NOTE — Assessment & Plan Note (Signed)
She's currently on Celexa 20 mg I will obtain the records from her previous physician to see if she was changed anything else. We may be able to use of Remeron in her chest that evidence for elderly, will also give her some appetite is well

## 2013-04-05 NOTE — Assessment & Plan Note (Signed)
She is doing well in general she has not required any of her when necessary hydrocodone

## 2013-04-09 ENCOUNTER — Other Ambulatory Visit: Payer: Self-pay | Admitting: Family Medicine

## 2013-04-09 ENCOUNTER — Other Ambulatory Visit: Payer: Self-pay

## 2013-04-10 ENCOUNTER — Other Ambulatory Visit: Payer: Self-pay | Admitting: *Deleted

## 2013-04-10 MED ORDER — DOCUSATE SODIUM 100 MG PO CAPS: 100.0000 mg | ORAL_CAPSULE | Freq: Two times a day (BID) | ORAL | Status: DC

## 2013-04-10 NOTE — Telephone Encounter (Signed)
Refill appropriate and filled per protocol. 

## 2013-04-13 ENCOUNTER — Telehealth: Payer: Self-pay | Admitting: Family Medicine

## 2013-04-13 MED ORDER — MIRTAZAPINE 7.5 MG PO TABS: 7.5000 mg | ORAL_TABLET | Freq: Every day | ORAL | Status: DC

## 2013-04-13 NOTE — Telephone Encounter (Signed)
Call [facility], pt needs to start Remeron 7.5mg  at bedtime to help with mood and appetite.

## 2013-04-16 ENCOUNTER — Other Ambulatory Visit: Payer: Self-pay | Admitting: *Deleted

## 2013-04-16 MED ORDER — LIDOCAINE-PRILOCAINE 2.5-2.5 % EX CREA: TOPICAL_CREAM | CUTANEOUS | Status: DC | PRN

## 2013-04-16 NOTE — Telephone Encounter (Signed)
Refill appropriate and filled per protocol. 

## 2013-04-17 ENCOUNTER — Other Ambulatory Visit: Payer: Self-pay | Admitting: *Deleted

## 2013-04-17 MED ORDER — LIDOCAINE-PRILOCAINE 2.5-2.5 % EX CREA: TOPICAL_CREAM | CUTANEOUS | Status: DC | PRN

## 2013-04-17 NOTE — Telephone Encounter (Signed)
Received fax from pharmacy requesting clarification on directions. Due to patient insurance apply as needed is not acceptable. SIG corrected to state to apply to L upper arm on MWF one hour before dialysis.

## 2013-05-03 ENCOUNTER — Other Ambulatory Visit: Payer: Self-pay | Admitting: *Deleted

## 2013-05-03 MED ORDER — RANITIDINE HCL 150 MG PO TABS: 150.0000 mg | ORAL_TABLET | Freq: Every day | ORAL | Status: DC

## 2013-05-03 NOTE — Telephone Encounter (Signed)
Refill appropriate and filled per protocol. 

## 2013-05-10 ENCOUNTER — Other Ambulatory Visit: Payer: Self-pay | Admitting: *Deleted

## 2013-05-10 ENCOUNTER — Telehealth: Payer: Self-pay | Admitting: *Deleted

## 2013-05-10 NOTE — Telephone Encounter (Signed)
Received call from MT with [facility].   Reported that patient was seen by NP from [facility] on 04/10/2013. States that at that time, NP wrote for Prostat 64 2 TBS BID d/t protein deficiency noted at dialysis.   Requested new order for Prostat to be sent to pharmacy.   MD please advise.

## 2013-05-10 NOTE — Telephone Encounter (Signed)
Her protein supplements are written by her kidney doctor , they need to send that order to them He is already giving her something

## 2013-05-11 NOTE — Telephone Encounter (Signed)
Call placed to facility.   Spoke with Lattie Haw, nurse with facility.   States that she will investigate and find out who PCP will be and send refill request to nephrologist.

## 2013-05-11 NOTE — Telephone Encounter (Signed)
Call placed to [facility] [phone].  [place] on [place] for [place], [place].

## 2013-06-28 ENCOUNTER — Other Ambulatory Visit: Payer: Self-pay | Admitting: *Deleted

## 2013-07-10 ENCOUNTER — Ambulatory Visit: Payer: Medicare HMO | Admitting: Family Medicine

## 2013-07-18 ENCOUNTER — Ambulatory Visit: Payer: Medicare HMO | Admitting: Family Medicine

## 2013-07-24 ENCOUNTER — Ambulatory Visit (INDEPENDENT_AMBULATORY_CARE_PROVIDER_SITE_OTHER): Payer: Medicare HMO | Admitting: Family Medicine

## 2013-07-24 ENCOUNTER — Encounter: Payer: Self-pay | Admitting: Family Medicine

## 2013-07-24 VITALS — BP 128/60 | HR 68 | Temp 98.3°F | Resp 12 | Ht 63.0 in | Wt 176.0 lb

## 2013-07-24 DIAGNOSIS — Z992 Dependence on renal dialysis: Secondary | ICD-10-CM

## 2013-07-24 DIAGNOSIS — N186 End stage renal disease: Secondary | ICD-10-CM

## 2013-07-24 DIAGNOSIS — I1311 Hypertensive heart and chronic kidney disease without heart failure, with stage 5 chronic kidney disease, or end stage renal disease: Secondary | ICD-10-CM

## 2013-07-24 DIAGNOSIS — M129 Arthropathy, unspecified: Secondary | ICD-10-CM

## 2013-07-24 DIAGNOSIS — F330 Major depressive disorder, recurrent, mild: Secondary | ICD-10-CM

## 2013-07-24 DIAGNOSIS — E118 Type 2 diabetes mellitus with unspecified complications: Secondary | ICD-10-CM

## 2013-07-24 DIAGNOSIS — N185 Chronic kidney disease, stage 5: Secondary | ICD-10-CM

## 2013-07-24 NOTE — Progress Notes (Signed)
Patient ID: [name], female   DOB: [birth year], 80 y.o.   MRN: [id number]   Subjective:    Patient ID: [name], female    DOB: 07/03/1925, 80 y.o.   MRN: [id number]  Patient presents for 6 month F/U  patient here to follow chronic medical problems. She has no specific concerns today. She is still in assisted living and is doing fairly well. She continues to state that she does not like living there but overall does not mind it. She is on dialysis 3 days a week. Diabetes mellitus is diet controlled she is off of all her medications her last A1c was 5.5%. Her blood sugars fasting at the assisted-living 114-142. She takes protein shakes on a daily basis secondary to her end-stage renal disease. She did have a fall about 3 months ago she did not seek care they gave her a pain pill which was ordered prescribed it she's done well. Her medications were reviewed and her forms were completed for renewals    Review Of Systems:  GEN- denies fatigue, fever, weight loss,weakness, recent illness HEENT- denies eye drainage, change in vision, nasal discharge, CVS- denies chest pain, palpitations RESP- denies SOB, cough, wheeze ABD- denies N/V, change in stools, abd pain GU- denies dysuria, hematuria, dribbling, incontinence MSK- +oint pain, muscle aches, injury Neuro- denies headache, dizziness, syncope, seizure activity       Objective:    BP 128/60  Pulse 68  Temp(Src) 98.3 F (36.8 C) (Oral)  Resp 12  Ht 5\' 3"  (1.6 m)  Wt 176 lb (79.833 kg)  BMI 31.18 kg/m2  LMP 12/24/2010  GEN- NAD, alert and oriented x3, walks with rollator HEENT- PERRL, EOMI, non injected sclera, pink conjunctiva, MMM, oropharynx clear Neck- Supple, no LAD CVS- RRR, 3/6 murmur RESP-CTAB ABD-NABS,soft,NT,ND EXT- No edema Pulses- Radial, DP- 2+ Psych- normal affect and mood, normal speech, good eye contact        Assessment & Plan:      Problem List Items Addressed This Visit   None      Note:  This dictation was prepared with Dragon dictation along with smaller phrase technology. Any transcriptional errors that result from this process are unintentional.

## 2013-07-24 NOTE — Patient Instructions (Signed)
F/U 4 months  

## 2013-07-24 NOTE — Assessment & Plan Note (Signed)
Regarding her depression she seems to be doing well at the assisted-living have not received any notations about her having difficulties at the facility. She's actually gained some weight since her last visit. He'll keep her on Celexa and Remeron

## 2013-07-24 NOTE — Assessment & Plan Note (Signed)
Blood pressure is well controlled 

## 2013-07-24 NOTE — Assessment & Plan Note (Signed)
Her diabetes is well-controlled with her diet we will followup another set of labs at her next visit

## 2013-07-24 NOTE — Assessment & Plan Note (Signed)
Continue acetaminophen and hydrocodone if she has severe pain

## 2013-07-30 ENCOUNTER — Other Ambulatory Visit: Payer: Self-pay | Admitting: *Deleted

## 2013-07-30 MED ORDER — MIRTAZAPINE 7.5 MG PO TABS: 7.5000 mg | ORAL_TABLET | Freq: Every day | ORAL | Status: DC

## 2013-08-01 ENCOUNTER — Other Ambulatory Visit: Payer: Self-pay | Admitting: *Deleted

## 2013-08-01 MED ORDER — MIRTAZAPINE 15 MG PO TABS: 7.5000 mg | ORAL_TABLET | Freq: Every day | ORAL | Status: DC

## 2013-08-01 NOTE — Telephone Encounter (Signed)
Refill appropriate and filled per protocol. 

## 2013-08-15 ENCOUNTER — Other Ambulatory Visit: Payer: Self-pay | Admitting: *Deleted

## 2013-08-15 MED ORDER — ACCU-CHEK SMARTVIEW CONTROL VI LIQD: Status: DC

## 2013-08-15 MED ORDER — GLUCOSE BLOOD VI STRP: ORAL_STRIP | Status: DC

## 2013-08-15 MED ORDER — ACCU-CHEK FASTCLIX LANCET KIT: PACK | Status: DC

## 2013-08-15 MED ORDER — ACCU-CHEK NANO SMARTVIEW W/DEVICE KIT: PACK | Status: DC

## 2013-08-15 NOTE — Telephone Encounter (Signed)
Received fax requesting refill on diabetic testing supplies.   Prescription sent to pharmacy.

## 2013-09-04 ENCOUNTER — Other Ambulatory Visit: Payer: Self-pay | Admitting: *Deleted

## 2013-09-04 MED ORDER — ASPIRIN 81 MG PO TBEC: DELAYED_RELEASE_TABLET | ORAL | Status: DC

## 2013-09-04 NOTE — Telephone Encounter (Signed)
Received fax requesting refill on ASA.   Refill appropriate and filled per protocol.

## 2013-09-12 ENCOUNTER — Ambulatory Visit: Payer: Medicare HMO | Admitting: Family Medicine

## 2013-09-13 ENCOUNTER — Other Ambulatory Visit: Payer: Self-pay | Admitting: *Deleted

## 2013-09-13 MED ORDER — CITALOPRAM HYDROBROMIDE 20 MG PO TABS: 20.0000 mg | ORAL_TABLET | ORAL | Status: DC

## 2013-09-13 MED ORDER — LEVOTHYROXINE SODIUM 25 MCG PO TABS: 25.0000 ug | ORAL_TABLET | Freq: Every day | ORAL | Status: DC

## 2013-09-13 MED ORDER — ISOSORBIDE MONONITRATE ER 60 MG PO TB24: 60.0000 mg | ORAL_TABLET | Freq: Every day | ORAL | Status: DC

## 2013-09-13 NOTE — Telephone Encounter (Signed)
Received fax requesting refill on Imdur, Celexa, and Levothyroxine.   Refill appropriate and filled per protocol.

## 2013-09-14 ENCOUNTER — Other Ambulatory Visit: Payer: Self-pay | Admitting: *Deleted

## 2013-09-14 MED ORDER — ISOSORBIDE MONONITRATE ER 60 MG PO TB24: 60.0000 mg | ORAL_TABLET | Freq: Every day | ORAL | Status: DC

## 2013-09-14 MED ORDER — AMLODIPINE BESYLATE 10 MG PO TABS: 10.0000 mg | ORAL_TABLET | ORAL | Status: DC

## 2013-09-14 MED ORDER — CITALOPRAM HYDROBROMIDE 20 MG PO TABS: 20.0000 mg | ORAL_TABLET | ORAL | Status: DC

## 2013-09-14 MED ORDER — LEVOTHYROXINE SODIUM 25 MCG PO TABS: 25.0000 ug | ORAL_TABLET | Freq: Every day | ORAL | Status: DC

## 2013-09-14 NOTE — Telephone Encounter (Signed)
Received fax requesting refill on Levothyroxine, Celexa, Norvasc and Imdur.   Refill appropriate and filled per protocol.

## 2013-09-18 ENCOUNTER — Ambulatory Visit (INDEPENDENT_AMBULATORY_CARE_PROVIDER_SITE_OTHER): Payer: Medicare HMO | Admitting: Family Medicine

## 2013-09-18 VITALS — BP 138/74 | HR 78 | Temp 97.7°F | Resp 16

## 2013-09-18 DIAGNOSIS — M179 Osteoarthritis of knee, unspecified: Secondary | ICD-10-CM | POA: Insufficient documentation

## 2013-09-18 DIAGNOSIS — M170 Bilateral primary osteoarthritis of knee: Secondary | ICD-10-CM

## 2013-09-18 DIAGNOSIS — M7989 Other specified soft tissue disorders: Secondary | ICD-10-CM

## 2013-09-18 DIAGNOSIS — M1710 Unilateral primary osteoarthritis, unspecified knee: Secondary | ICD-10-CM

## 2013-09-18 MED ORDER — TRAMADOL HCL 50 MG PO TABS: 50.0000 mg | ORAL_TABLET | Freq: Two times a day (BID) | ORAL | Status: DC | PRN

## 2013-09-18 NOTE — Patient Instructions (Signed)
Continue current medications New arthritis pill Ultram twice a day  Ultrasound of leg to be done to rule out blood clot F/u as previous

## 2013-09-18 NOTE — Assessment & Plan Note (Signed)
Will give ultram 50mg  BID prn pain Continue tylenol

## 2013-09-18 NOTE — Progress Notes (Signed)
Patient ID: [name], female   DOB: 05/28/1925, 80 y.o.   MRN: [id number]   Subjective:    Patient ID: [name], female    DOB: [birth year], 80 y.o.   MRN: [id number]  Patient presents for R leg pain  patient here with right leg and calf pain. She was seen in the [facility] emergency room about 2-3 weeks ago she had x-rays done which showed arthritis of her knees and her hips she's been taking Tylenol as well as Lodine as needed which she's only had one dose of this. She continues to have swelling of her calf and behind her knee. She states that the left leg does not swell or give her any trouble. She currently walks with a walker at baseline. She's here today with her daughter. She's not had any recent falls  Review Of Systems:  GEN- denies fatigue, fever, weight loss,weakness, recent illness HEENT- denies eye drainage, change in vision, nasal discharge, CVS- denies chest pain, palpitations RESP- denies SOB, cough, wheeze MSK- +joint pain, muscle aches, injury Neuro- denies headache, dizziness, syncope, seizure activity       Objective:    BP 138/74  Pulse 78  Temp(Src) 97.7 F (36.5 C) (Oral)  Resp 16  LMP 12/24/2010 GEN- NAD, alert and oriented x3, walks with rollator CVS- RRR, 3/6 murmur RESP-CTAB MSK- Bilat Knees- normal inspection no effusion, fair ROM, TTP Right calf, mild swelling and palpable cystic like lesion -ttp, swelling of right ankle, left ankle, mild pedal edema Pulses- Radial 2+        Assessment & Plan:      Problem List Items Addressed This Visit   None      Note: This dictation was prepared with Dragon dictation along with smaller phrase technology. Any transcriptional errors that result from this process are unintentional.

## 2013-09-18 NOTE — Assessment & Plan Note (Signed)
Right calf pain and swelling, [place] to rule out DVT

## 2013-09-19 ENCOUNTER — Other Ambulatory Visit: Payer: Self-pay | Admitting: *Deleted

## 2013-09-21 ENCOUNTER — Other Ambulatory Visit: Payer: Self-pay | Admitting: Family Medicine

## 2013-09-27 ENCOUNTER — Telehealth: Payer: Self-pay | Admitting: Family Medicine

## 2013-09-27 NOTE — Telephone Encounter (Signed)
LMTRC

## 2013-09-27 NOTE — Telephone Encounter (Signed)
Lattie Haw from [place] in [place] calling to ask if she faxes a icd 9 transition form can we fill this out for the patient please call lisa back at [phone]

## 2013-09-28 NOTE — Telephone Encounter (Signed)
LMTRC

## 2013-10-01 ENCOUNTER — Telehealth: Payer: Self-pay | Admitting: Family Medicine

## 2013-10-01 MED ORDER — POLYETHYLENE GLYCOL 3350 17 GM/SCOOP PO POWD: 17.0000 g | Freq: Every day | ORAL | Status: DC

## 2013-10-01 NOTE — Telephone Encounter (Signed)
noted 

## 2013-10-01 NOTE — Telephone Encounter (Signed)
[name] from [facility] in [place] calling to talk to you regarding some of her medications please call her back at [phone] or [phone]

## 2013-10-01 NOTE — Telephone Encounter (Signed)
Call placed to facility. Spoke with Lattie Haw, LPN.  Reports that she has been having issues with the pharmacy.   Reports that prescriptions for Synthroid, Imdur, and Celexa were sent out from [facility]. Lattie Haw states that medications had been returned to pharmacy since patient is receiving bubble packs from [facility].   [place] states that prescriptions were not returned, and therefore would not pay for duplicate prescriptions at [facility]. Conversation held x2 weeks ago with Lattie Haw who reported that [facility] would override this if new prescriptions were sent to [facility]. Prescription sent to pharmacy.   Reports that [employer] did not override and patient has been without medication x3 weeks.  MD to be made aware.

## 2013-10-03 ENCOUNTER — Other Ambulatory Visit: Payer: Self-pay | Admitting: *Deleted

## 2013-10-03 MED ORDER — DAILY VITE PO TABS: 1.0000 | ORAL_TABLET | Freq: Every day | ORAL | Status: DC

## 2013-10-03 NOTE — Telephone Encounter (Signed)
Received fax requesting refill on MVI.  Refill appropriate and filled per protocol.

## 2013-10-31 ENCOUNTER — Telehealth: Payer: Self-pay | Admitting: *Deleted

## 2013-10-31 NOTE — Telephone Encounter (Signed)
Received call from Ronalee Belts at [facility] in reference to this patient, he was calling to give me information on who to contact if she needed special [place] [phone], they are her point of care contact. Ask for care mgmt for this pt when you call. Special needs care manager number is [phone] had spoke to pt earlier today and she stated that she was doing fine at this time and does not have any areas of concern.

## 2013-11-05 ENCOUNTER — Other Ambulatory Visit: Payer: Self-pay | Admitting: *Deleted

## 2013-11-05 MED ORDER — DOCUSATE SODIUM 100 MG PO CAPS: 100.0000 mg | ORAL_CAPSULE | Freq: Two times a day (BID) | ORAL | Status: DC

## 2013-11-05 NOTE — Telephone Encounter (Signed)
Received fax requesting refill on colace.   Refill appropriate and filled per protocol.

## 2013-11-09 ENCOUNTER — Telehealth: Payer: Self-pay | Admitting: Family Medicine

## 2013-11-09 MED ORDER — LIDOCAINE-PRILOCAINE 2.5-2.5 % EX CREA: TOPICAL_CREAM | CUTANEOUS | Status: DC | PRN

## 2013-11-09 NOTE — Telephone Encounter (Signed)
Medication refilled per protocol. 

## 2013-11-13 ENCOUNTER — Other Ambulatory Visit: Payer: Self-pay | Admitting: *Deleted

## 2013-11-13 MED ORDER — LIDOCAINE-PRILOCAINE 2.5-2.5 % EX CREA: TOPICAL_CREAM | CUTANEOUS | Status: DC | PRN

## 2013-11-13 NOTE — Telephone Encounter (Signed)
Received fax requesting refill on Elma cream.   Refill appropriate and filled per protocol.

## 2013-11-20 ENCOUNTER — Other Ambulatory Visit: Payer: Self-pay | Admitting: *Deleted

## 2013-11-20 MED ORDER — ALBUTEROL SULFATE HFA 108 (90 BASE) MCG/ACT IN AERS: 2.0000 | INHALATION_SPRAY | RESPIRATORY_TRACT | Status: DC | PRN

## 2013-11-20 NOTE — Telephone Encounter (Signed)
Received fax requesting refill on Albuterol inhaler.   Refill appropriate and filled per protocol.

## 2013-11-27 ENCOUNTER — Ambulatory Visit: Payer: Medicare HMO | Admitting: Family Medicine

## 2013-12-03 ENCOUNTER — Encounter: Payer: Self-pay | Admitting: Family Medicine

## 2013-12-04 ENCOUNTER — Other Ambulatory Visit: Payer: Self-pay | Admitting: *Deleted

## 2013-12-04 MED ORDER — RANITIDINE HCL 150 MG PO TABS: 150.0000 mg | ORAL_TABLET | Freq: Every day | ORAL | Status: DC

## 2013-12-04 MED ORDER — MIRTAZAPINE 15 MG PO TABS: 7.5000 mg | ORAL_TABLET | Freq: Every day | ORAL | Status: DC

## 2013-12-04 NOTE — Telephone Encounter (Signed)
Received fax requesting refill on Remeron and Zantac.   Refill appropriate and filled per protocol.

## 2013-12-20 ENCOUNTER — Telehealth: Payer: Self-pay | Admitting: Family Medicine

## 2013-12-20 NOTE — Telephone Encounter (Signed)
Call placed to patient.   Patient states that she needs forms filled out for her insurance to pay for hearing aids.   Advised to call audiologist and have the doctor there fill out insurance forms.   Call placed to patient daughter, Dub Amis. Advised that patient was most likely confused as to which MD needed forms for insurance.

## 2013-12-20 NOTE — Telephone Encounter (Signed)
Patient is calling to speak with you regarding a medical card  [phone]

## 2014-01-11 DIAGNOSIS — N186 End stage renal disease: Secondary | ICD-10-CM | POA: Diagnosis not present

## 2014-01-11 DIAGNOSIS — Z992 Dependence on renal dialysis: Secondary | ICD-10-CM | POA: Diagnosis not present

## 2014-01-11 DIAGNOSIS — D631 Anemia in chronic kidney disease: Secondary | ICD-10-CM | POA: Diagnosis not present

## 2014-01-11 DIAGNOSIS — D509 Iron deficiency anemia, unspecified: Secondary | ICD-10-CM | POA: Diagnosis not present

## 2014-01-11 DIAGNOSIS — N2581 Secondary hyperparathyroidism of renal origin: Secondary | ICD-10-CM | POA: Diagnosis not present

## 2014-01-11 DIAGNOSIS — R159 Full incontinence of feces: Secondary | ICD-10-CM | POA: Diagnosis not present

## 2014-01-11 DIAGNOSIS — F0390 Unspecified dementia without behavioral disturbance: Secondary | ICD-10-CM | POA: Diagnosis not present

## 2014-01-11 DIAGNOSIS — R32 Unspecified urinary incontinence: Secondary | ICD-10-CM | POA: Diagnosis not present

## 2014-01-12 DIAGNOSIS — F0390 Unspecified dementia without behavioral disturbance: Secondary | ICD-10-CM | POA: Diagnosis not present

## 2014-01-13 DIAGNOSIS — F0390 Unspecified dementia without behavioral disturbance: Secondary | ICD-10-CM | POA: Diagnosis not present

## 2014-01-14 DIAGNOSIS — N2581 Secondary hyperparathyroidism of renal origin: Secondary | ICD-10-CM | POA: Diagnosis not present

## 2014-01-14 DIAGNOSIS — N186 End stage renal disease: Secondary | ICD-10-CM | POA: Diagnosis not present

## 2014-01-14 DIAGNOSIS — F0390 Unspecified dementia without behavioral disturbance: Secondary | ICD-10-CM | POA: Diagnosis not present

## 2014-01-14 DIAGNOSIS — D509 Iron deficiency anemia, unspecified: Secondary | ICD-10-CM | POA: Diagnosis not present

## 2014-01-14 DIAGNOSIS — Z992 Dependence on renal dialysis: Secondary | ICD-10-CM | POA: Diagnosis not present

## 2014-01-14 DIAGNOSIS — D631 Anemia in chronic kidney disease: Secondary | ICD-10-CM | POA: Diagnosis not present

## 2014-01-15 DIAGNOSIS — H20012 Primary iridocyclitis, left eye: Secondary | ICD-10-CM | POA: Diagnosis not present

## 2014-01-15 DIAGNOSIS — F0390 Unspecified dementia without behavioral disturbance: Secondary | ICD-10-CM | POA: Diagnosis not present

## 2014-01-16 DIAGNOSIS — N186 End stage renal disease: Secondary | ICD-10-CM | POA: Diagnosis not present

## 2014-01-16 DIAGNOSIS — D631 Anemia in chronic kidney disease: Secondary | ICD-10-CM | POA: Diagnosis not present

## 2014-01-16 DIAGNOSIS — Z992 Dependence on renal dialysis: Secondary | ICD-10-CM | POA: Diagnosis not present

## 2014-01-16 DIAGNOSIS — D509 Iron deficiency anemia, unspecified: Secondary | ICD-10-CM | POA: Diagnosis not present

## 2014-01-16 DIAGNOSIS — F0390 Unspecified dementia without behavioral disturbance: Secondary | ICD-10-CM | POA: Diagnosis not present

## 2014-01-16 DIAGNOSIS — N2581 Secondary hyperparathyroidism of renal origin: Secondary | ICD-10-CM | POA: Diagnosis not present

## 2014-01-17 DIAGNOSIS — F0390 Unspecified dementia without behavioral disturbance: Secondary | ICD-10-CM | POA: Diagnosis not present

## 2014-01-18 DIAGNOSIS — D509 Iron deficiency anemia, unspecified: Secondary | ICD-10-CM | POA: Diagnosis not present

## 2014-01-18 DIAGNOSIS — D631 Anemia in chronic kidney disease: Secondary | ICD-10-CM | POA: Diagnosis not present

## 2014-01-18 DIAGNOSIS — F0390 Unspecified dementia without behavioral disturbance: Secondary | ICD-10-CM | POA: Diagnosis not present

## 2014-01-18 DIAGNOSIS — Z992 Dependence on renal dialysis: Secondary | ICD-10-CM | POA: Diagnosis not present

## 2014-01-18 DIAGNOSIS — N186 End stage renal disease: Secondary | ICD-10-CM | POA: Diagnosis not present

## 2014-01-18 DIAGNOSIS — N2581 Secondary hyperparathyroidism of renal origin: Secondary | ICD-10-CM | POA: Diagnosis not present

## 2014-01-19 DIAGNOSIS — F0390 Unspecified dementia without behavioral disturbance: Secondary | ICD-10-CM | POA: Diagnosis not present

## 2014-01-20 DIAGNOSIS — F0390 Unspecified dementia without behavioral disturbance: Secondary | ICD-10-CM | POA: Diagnosis not present

## 2014-01-21 DIAGNOSIS — D509 Iron deficiency anemia, unspecified: Secondary | ICD-10-CM | POA: Diagnosis not present

## 2014-01-21 DIAGNOSIS — D631 Anemia in chronic kidney disease: Secondary | ICD-10-CM | POA: Diagnosis not present

## 2014-01-21 DIAGNOSIS — F0390 Unspecified dementia without behavioral disturbance: Secondary | ICD-10-CM | POA: Diagnosis not present

## 2014-01-21 DIAGNOSIS — Z992 Dependence on renal dialysis: Secondary | ICD-10-CM | POA: Diagnosis not present

## 2014-01-21 DIAGNOSIS — N2581 Secondary hyperparathyroidism of renal origin: Secondary | ICD-10-CM | POA: Diagnosis not present

## 2014-01-21 DIAGNOSIS — N186 End stage renal disease: Secondary | ICD-10-CM | POA: Diagnosis not present

## 2014-01-22 DIAGNOSIS — F0390 Unspecified dementia without behavioral disturbance: Secondary | ICD-10-CM | POA: Diagnosis not present

## 2014-01-23 DIAGNOSIS — N186 End stage renal disease: Secondary | ICD-10-CM | POA: Diagnosis not present

## 2014-01-23 DIAGNOSIS — N2581 Secondary hyperparathyroidism of renal origin: Secondary | ICD-10-CM | POA: Diagnosis not present

## 2014-01-23 DIAGNOSIS — D509 Iron deficiency anemia, unspecified: Secondary | ICD-10-CM | POA: Diagnosis not present

## 2014-01-23 DIAGNOSIS — D631 Anemia in chronic kidney disease: Secondary | ICD-10-CM | POA: Diagnosis not present

## 2014-01-23 DIAGNOSIS — Z992 Dependence on renal dialysis: Secondary | ICD-10-CM | POA: Diagnosis not present

## 2014-01-23 DIAGNOSIS — F0390 Unspecified dementia without behavioral disturbance: Secondary | ICD-10-CM | POA: Diagnosis not present

## 2014-01-24 DIAGNOSIS — F0390 Unspecified dementia without behavioral disturbance: Secondary | ICD-10-CM | POA: Diagnosis not present

## 2014-01-25 DIAGNOSIS — D631 Anemia in chronic kidney disease: Secondary | ICD-10-CM | POA: Diagnosis not present

## 2014-01-25 DIAGNOSIS — D509 Iron deficiency anemia, unspecified: Secondary | ICD-10-CM | POA: Diagnosis not present

## 2014-01-25 DIAGNOSIS — N186 End stage renal disease: Secondary | ICD-10-CM | POA: Diagnosis not present

## 2014-01-25 DIAGNOSIS — F0390 Unspecified dementia without behavioral disturbance: Secondary | ICD-10-CM | POA: Diagnosis not present

## 2014-01-25 DIAGNOSIS — N2581 Secondary hyperparathyroidism of renal origin: Secondary | ICD-10-CM | POA: Diagnosis not present

## 2014-01-25 DIAGNOSIS — Z992 Dependence on renal dialysis: Secondary | ICD-10-CM | POA: Diagnosis not present

## 2014-01-26 DIAGNOSIS — F0390 Unspecified dementia without behavioral disturbance: Secondary | ICD-10-CM | POA: Diagnosis not present

## 2014-01-27 DIAGNOSIS — F0390 Unspecified dementia without behavioral disturbance: Secondary | ICD-10-CM | POA: Diagnosis not present

## 2014-01-28 DIAGNOSIS — N186 End stage renal disease: Secondary | ICD-10-CM | POA: Diagnosis not present

## 2014-01-28 DIAGNOSIS — F0390 Unspecified dementia without behavioral disturbance: Secondary | ICD-10-CM | POA: Diagnosis not present

## 2014-01-28 DIAGNOSIS — D631 Anemia in chronic kidney disease: Secondary | ICD-10-CM | POA: Diagnosis not present

## 2014-01-28 DIAGNOSIS — D509 Iron deficiency anemia, unspecified: Secondary | ICD-10-CM | POA: Diagnosis not present

## 2014-01-28 DIAGNOSIS — N2581 Secondary hyperparathyroidism of renal origin: Secondary | ICD-10-CM | POA: Diagnosis not present

## 2014-01-28 DIAGNOSIS — Z992 Dependence on renal dialysis: Secondary | ICD-10-CM | POA: Diagnosis not present

## 2014-01-29 DIAGNOSIS — F0390 Unspecified dementia without behavioral disturbance: Secondary | ICD-10-CM | POA: Diagnosis not present

## 2014-01-29 DIAGNOSIS — H20012 Primary iridocyclitis, left eye: Secondary | ICD-10-CM | POA: Diagnosis not present

## 2014-01-30 DIAGNOSIS — F0390 Unspecified dementia without behavioral disturbance: Secondary | ICD-10-CM | POA: Diagnosis not present

## 2014-01-30 DIAGNOSIS — D631 Anemia in chronic kidney disease: Secondary | ICD-10-CM | POA: Diagnosis not present

## 2014-01-30 DIAGNOSIS — N186 End stage renal disease: Secondary | ICD-10-CM | POA: Diagnosis not present

## 2014-01-30 DIAGNOSIS — N2581 Secondary hyperparathyroidism of renal origin: Secondary | ICD-10-CM | POA: Diagnosis not present

## 2014-01-30 DIAGNOSIS — Z992 Dependence on renal dialysis: Secondary | ICD-10-CM | POA: Diagnosis not present

## 2014-01-30 DIAGNOSIS — D509 Iron deficiency anemia, unspecified: Secondary | ICD-10-CM | POA: Diagnosis not present

## 2014-01-31 DIAGNOSIS — F0390 Unspecified dementia without behavioral disturbance: Secondary | ICD-10-CM | POA: Diagnosis not present

## 2014-02-01 DIAGNOSIS — N186 End stage renal disease: Secondary | ICD-10-CM | POA: Diagnosis not present

## 2014-02-01 DIAGNOSIS — Z992 Dependence on renal dialysis: Secondary | ICD-10-CM | POA: Diagnosis not present

## 2014-02-01 DIAGNOSIS — D631 Anemia in chronic kidney disease: Secondary | ICD-10-CM | POA: Diagnosis not present

## 2014-02-01 DIAGNOSIS — D509 Iron deficiency anemia, unspecified: Secondary | ICD-10-CM | POA: Diagnosis not present

## 2014-02-01 DIAGNOSIS — N2581 Secondary hyperparathyroidism of renal origin: Secondary | ICD-10-CM | POA: Diagnosis not present

## 2014-02-01 DIAGNOSIS — F0390 Unspecified dementia without behavioral disturbance: Secondary | ICD-10-CM | POA: Diagnosis not present

## 2014-02-02 DIAGNOSIS — F0390 Unspecified dementia without behavioral disturbance: Secondary | ICD-10-CM | POA: Diagnosis not present

## 2014-02-03 DIAGNOSIS — F0390 Unspecified dementia without behavioral disturbance: Secondary | ICD-10-CM | POA: Diagnosis not present

## 2014-02-04 DIAGNOSIS — N2581 Secondary hyperparathyroidism of renal origin: Secondary | ICD-10-CM | POA: Diagnosis not present

## 2014-02-04 DIAGNOSIS — Z992 Dependence on renal dialysis: Secondary | ICD-10-CM | POA: Diagnosis not present

## 2014-02-04 DIAGNOSIS — N186 End stage renal disease: Secondary | ICD-10-CM | POA: Diagnosis not present

## 2014-02-04 DIAGNOSIS — F0390 Unspecified dementia without behavioral disturbance: Secondary | ICD-10-CM | POA: Diagnosis not present

## 2014-02-04 DIAGNOSIS — D509 Iron deficiency anemia, unspecified: Secondary | ICD-10-CM | POA: Diagnosis not present

## 2014-02-04 DIAGNOSIS — D631 Anemia in chronic kidney disease: Secondary | ICD-10-CM | POA: Diagnosis not present

## 2014-02-05 ENCOUNTER — Encounter: Payer: Self-pay | Admitting: Family Medicine

## 2014-02-05 DIAGNOSIS — F0390 Unspecified dementia without behavioral disturbance: Secondary | ICD-10-CM | POA: Diagnosis not present

## 2014-02-05 DIAGNOSIS — L84 Corns and callosities: Secondary | ICD-10-CM | POA: Diagnosis not present

## 2014-02-05 DIAGNOSIS — E1051 Type 1 diabetes mellitus with diabetic peripheral angiopathy without gangrene: Secondary | ICD-10-CM | POA: Diagnosis not present

## 2014-02-06 DIAGNOSIS — D631 Anemia in chronic kidney disease: Secondary | ICD-10-CM | POA: Diagnosis not present

## 2014-02-06 DIAGNOSIS — Z992 Dependence on renal dialysis: Secondary | ICD-10-CM | POA: Diagnosis not present

## 2014-02-06 DIAGNOSIS — F0390 Unspecified dementia without behavioral disturbance: Secondary | ICD-10-CM | POA: Diagnosis not present

## 2014-02-06 DIAGNOSIS — D509 Iron deficiency anemia, unspecified: Secondary | ICD-10-CM | POA: Diagnosis not present

## 2014-02-06 DIAGNOSIS — N2581 Secondary hyperparathyroidism of renal origin: Secondary | ICD-10-CM | POA: Diagnosis not present

## 2014-02-06 DIAGNOSIS — N186 End stage renal disease: Secondary | ICD-10-CM | POA: Diagnosis not present

## 2014-02-07 DIAGNOSIS — F0390 Unspecified dementia without behavioral disturbance: Secondary | ICD-10-CM | POA: Diagnosis not present

## 2014-02-08 DIAGNOSIS — D509 Iron deficiency anemia, unspecified: Secondary | ICD-10-CM | POA: Diagnosis not present

## 2014-02-08 DIAGNOSIS — D631 Anemia in chronic kidney disease: Secondary | ICD-10-CM | POA: Diagnosis not present

## 2014-02-08 DIAGNOSIS — N2581 Secondary hyperparathyroidism of renal origin: Secondary | ICD-10-CM | POA: Diagnosis not present

## 2014-02-08 DIAGNOSIS — Z992 Dependence on renal dialysis: Secondary | ICD-10-CM | POA: Diagnosis not present

## 2014-02-08 DIAGNOSIS — F0390 Unspecified dementia without behavioral disturbance: Secondary | ICD-10-CM | POA: Diagnosis not present

## 2014-02-08 DIAGNOSIS — N186 End stage renal disease: Secondary | ICD-10-CM | POA: Diagnosis not present

## 2014-02-09 DIAGNOSIS — F0390 Unspecified dementia without behavioral disturbance: Secondary | ICD-10-CM | POA: Diagnosis not present

## 2014-02-10 DIAGNOSIS — N186 End stage renal disease: Secondary | ICD-10-CM | POA: Diagnosis not present

## 2014-02-10 DIAGNOSIS — F0390 Unspecified dementia without behavioral disturbance: Secondary | ICD-10-CM | POA: Diagnosis not present

## 2014-02-10 DIAGNOSIS — Z992 Dependence on renal dialysis: Secondary | ICD-10-CM | POA: Diagnosis not present

## 2014-02-11 DIAGNOSIS — D631 Anemia in chronic kidney disease: Secondary | ICD-10-CM | POA: Diagnosis not present

## 2014-02-11 DIAGNOSIS — F0390 Unspecified dementia without behavioral disturbance: Secondary | ICD-10-CM | POA: Diagnosis not present

## 2014-02-11 DIAGNOSIS — D509 Iron deficiency anemia, unspecified: Secondary | ICD-10-CM | POA: Diagnosis not present

## 2014-02-11 DIAGNOSIS — N186 End stage renal disease: Secondary | ICD-10-CM | POA: Diagnosis not present

## 2014-02-11 DIAGNOSIS — Z992 Dependence on renal dialysis: Secondary | ICD-10-CM | POA: Diagnosis not present

## 2014-02-11 DIAGNOSIS — N2581 Secondary hyperparathyroidism of renal origin: Secondary | ICD-10-CM | POA: Diagnosis not present

## 2014-02-12 ENCOUNTER — Ambulatory Visit: Payer: Medicare HMO | Admitting: Family Medicine

## 2014-02-12 ENCOUNTER — Encounter: Payer: Self-pay | Admitting: Family Medicine

## 2014-02-12 ENCOUNTER — Ambulatory Visit (INDEPENDENT_AMBULATORY_CARE_PROVIDER_SITE_OTHER): Payer: Medicare HMO | Admitting: Family Medicine

## 2014-02-12 VITALS — BP 130/64 | HR 72 | Temp 98.7°F | Resp 18 | Ht 63.0 in | Wt 176.0 lb

## 2014-02-12 DIAGNOSIS — E1122 Type 2 diabetes mellitus with diabetic chronic kidney disease: Secondary | ICD-10-CM

## 2014-02-12 DIAGNOSIS — N186 End stage renal disease: Secondary | ICD-10-CM

## 2014-02-12 DIAGNOSIS — F0390 Unspecified dementia without behavioral disturbance: Secondary | ICD-10-CM | POA: Diagnosis not present

## 2014-02-12 DIAGNOSIS — N189 Chronic kidney disease, unspecified: Secondary | ICD-10-CM | POA: Diagnosis not present

## 2014-02-12 DIAGNOSIS — S0003XA Contusion of scalp, initial encounter: Secondary | ICD-10-CM | POA: Insufficient documentation

## 2014-02-12 DIAGNOSIS — N185 Chronic kidney disease, stage 5: Secondary | ICD-10-CM

## 2014-02-12 DIAGNOSIS — H9193 Unspecified hearing loss, bilateral: Secondary | ICD-10-CM

## 2014-02-12 DIAGNOSIS — I1311 Hypertensive heart and chronic kidney disease without heart failure, with stage 5 chronic kidney disease, or end stage renal disease: Secondary | ICD-10-CM

## 2014-02-12 DIAGNOSIS — S0003XD Contusion of scalp, subsequent encounter: Secondary | ICD-10-CM

## 2014-02-12 DIAGNOSIS — Z9181 History of falling: Secondary | ICD-10-CM

## 2014-02-12 DIAGNOSIS — R296 Repeated falls: Secondary | ICD-10-CM

## 2014-02-12 DIAGNOSIS — E038 Other specified hypothyroidism: Secondary | ICD-10-CM | POA: Diagnosis not present

## 2014-02-12 DIAGNOSIS — Z992 Dependence on renal dialysis: Secondary | ICD-10-CM

## 2014-02-12 LAB — CBC WITH DIFFERENTIAL/PLATELET
BASOS PCT: 0 % (ref 0–1)
Basophils Absolute: 0 10*3/uL (ref 0.0–0.1)
Eosinophils Absolute: 0 10*3/uL (ref 0.0–0.7)
Eosinophils Relative: 0 % (ref 0–5)
HCT: 34.8 % — ABNORMAL LOW (ref 36.0–46.0)
HEMOGLOBIN: 11 g/dL — AB (ref 12.0–15.0)
LYMPHS PCT: 35 % (ref 12–46)
Lymphs Abs: 1.9 10*3/uL (ref 0.7–4.0)
MCH: 25.2 pg — AB (ref 26.0–34.0)
MCHC: 31.6 g/dL (ref 30.0–36.0)
MCV: 79.8 fL (ref 78.0–100.0)
MPV: 9.9 fL (ref 8.6–12.4)
Monocytes Absolute: 0.5 10*3/uL (ref 0.1–1.0)
Monocytes Relative: 9 % (ref 3–12)
Neutro Abs: 3.1 10*3/uL (ref 1.7–7.7)
Neutrophils Relative %: 56 % (ref 43–77)
PLATELETS: 212 10*3/uL (ref 150–400)
RBC: 4.36 MIL/uL (ref 3.87–5.11)
RDW: 15.9 % — AB (ref 11.5–15.5)
WBC: 5.5 10*3/uL (ref 4.0–10.5)

## 2014-02-12 LAB — LIPID PANEL
CHOLESTEROL: 136 mg/dL (ref 0–200)
HDL: 54 mg/dL (ref >39)
LDL CALC: 67 mg/dL (ref 0–99)
Total CHOL/HDL Ratio: 2.5 Ratio
Triglycerides: 75 mg/dL (ref <150)
VLDL: 15 mg/dL (ref 0–40)

## 2014-02-12 LAB — TSH: TSH: 0.891 u[IU]/mL (ref 0.350–4.500)

## 2014-02-12 LAB — HEMOGLOBIN A1C
Hgb A1c MFr Bld: 5.9 % — ABNORMAL HIGH (ref <5.7)
MEAN PLASMA GLUCOSE: 123 mg/dL — AB (ref <117)

## 2014-02-12 LAB — T3, FREE: T3, Free: 2.8 pg/mL (ref 2.3–4.2)

## 2014-02-12 NOTE — Assessment & Plan Note (Signed)
Resolving no further imaging needed,neurologically at baseline

## 2014-02-12 NOTE — Progress Notes (Signed)
Patient ID: [name], female   DOB: 1925-01-24, 81 y.o.   MRN: [id number]   Subjective:    Patient ID: [name], female    DOB: Dec 17, 1925, 81 y.o.   MRN: [id number]  Patient presents for Post Fall   patient to call with problems. She sustained a fall at [facility] states about 4 weeks ago she was reaching to get the telephone when she tripped and fell and hit her head against a wall. She had a significant hematoma she was taken to [facility] CT scan was done which  Did not show any intracerebral bleed is like everything was superficial although I do not have the skin chronically. The next day she actually had a bloodshot eye left side appears that she may have burst blood vessel during this time she was seen by her ophthalmologist was placed on some drops she has follow-up with him this week. Her vision is back to baseline in the left eye she has not had any increase in headaches since the fall her mental status is at baseline. She does request a referral back to audiologist said she can get new hearing. Otherwise her medications were reviewed. She is here today with her daughter. She is still on dialysis TIW  Review Of Systems:  GEN- denies fatigue, fever, weight loss,weakness, recent illness HEENT- denies eye drainage, change in vision, nasal discharge, CVS- denies chest pain, palpitations RESP- denies SOB, cough, wheeze ABD- denies N/V, change in stools, abd pain GU- denies dysuria, hematuria, dribbling, incontinence MSK- denies joint pain, muscle aches, injury Neuro- denies headache, dizziness, syncope, seizure activity       Objective:    BP 130/64 mmHg  Pulse 72  Temp(Src) 98.7 F (37.1 C) (Oral)  Resp 18  Ht 5\' 3"  (1.6 m)  Wt 176 lb (79.833 kg)  BMI 31.18 kg/m2  LMP 12/24/2010 GEN- NAD, alert and oriented x3, walks with rollator HEENT- PERRL,. EOMI, non icteric pink conjunctiva, MMM oropharynx clear, vision grossly in tact CVS- RRR, 3/6  murmur RESP-CTAB Head- Right parietocciptal- small knot ,NT, no fluctuance, no scab Neuro- CNII-XII in tact, unsteady gait (baseline) , moving all 4 ext Pulses- Radial 2+         Assessment & Plan:      Problem List Items Addressed This Visit      Unprioritized   Traumatic hematoma of scalp   Hypothyroidism   Relevant Orders   TSH   T3, free   ESRD (end stage renal disease) on dialysis   Diabetes mellitus with renal complications - Primary   Relevant Orders   CBC with Differential/Platelet   Lipid panel   Hemoglobin A1c   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 Any transcriptional errors that result from this process are unintentional.

## 2014-02-12 NOTE — Assessment & Plan Note (Signed)
Well controlled, no change to meds 

## 2014-02-12 NOTE — Assessment & Plan Note (Signed)
Diet controlled, recheck A1C  

## 2014-02-12 NOTE — Assessment & Plan Note (Signed)
Check TFT continue synthroid 

## 2014-02-12 NOTE — Patient Instructions (Signed)
Continue current medicatons Referral to ENT for your hearing We will call with lab results  F/U 4 months

## 2014-02-13 DIAGNOSIS — D509 Iron deficiency anemia, unspecified: Secondary | ICD-10-CM | POA: Diagnosis not present

## 2014-02-13 DIAGNOSIS — N2581 Secondary hyperparathyroidism of renal origin: Secondary | ICD-10-CM | POA: Diagnosis not present

## 2014-02-13 DIAGNOSIS — Z992 Dependence on renal dialysis: Secondary | ICD-10-CM | POA: Diagnosis not present

## 2014-02-13 DIAGNOSIS — N186 End stage renal disease: Secondary | ICD-10-CM | POA: Diagnosis not present

## 2014-02-13 DIAGNOSIS — D631 Anemia in chronic kidney disease: Secondary | ICD-10-CM | POA: Diagnosis not present

## 2014-02-13 DIAGNOSIS — F0390 Unspecified dementia without behavioral disturbance: Secondary | ICD-10-CM | POA: Diagnosis not present

## 2014-02-14 ENCOUNTER — Encounter: Payer: Self-pay | Admitting: *Deleted

## 2014-02-14 DIAGNOSIS — F0390 Unspecified dementia without behavioral disturbance: Secondary | ICD-10-CM | POA: Diagnosis not present

## 2014-02-14 DIAGNOSIS — Z961 Presence of intraocular lens: Secondary | ICD-10-CM | POA: Diagnosis not present

## 2014-02-14 DIAGNOSIS — H20012 Primary iridocyclitis, left eye: Secondary | ICD-10-CM | POA: Diagnosis not present

## 2014-02-15 ENCOUNTER — Other Ambulatory Visit: Payer: Self-pay | Admitting: *Deleted

## 2014-02-15 DIAGNOSIS — N186 End stage renal disease: Secondary | ICD-10-CM | POA: Diagnosis not present

## 2014-02-15 DIAGNOSIS — Z992 Dependence on renal dialysis: Secondary | ICD-10-CM | POA: Diagnosis not present

## 2014-02-15 DIAGNOSIS — H9193 Unspecified hearing loss, bilateral: Secondary | ICD-10-CM

## 2014-02-15 DIAGNOSIS — N2581 Secondary hyperparathyroidism of renal origin: Secondary | ICD-10-CM | POA: Diagnosis not present

## 2014-02-15 DIAGNOSIS — F0390 Unspecified dementia without behavioral disturbance: Secondary | ICD-10-CM | POA: Diagnosis not present

## 2014-02-15 DIAGNOSIS — D631 Anemia in chronic kidney disease: Secondary | ICD-10-CM | POA: Diagnosis not present

## 2014-02-15 DIAGNOSIS — D509 Iron deficiency anemia, unspecified: Secondary | ICD-10-CM | POA: Diagnosis not present

## 2014-02-16 DIAGNOSIS — F0390 Unspecified dementia without behavioral disturbance: Secondary | ICD-10-CM | POA: Diagnosis not present

## 2014-02-17 DIAGNOSIS — F0390 Unspecified dementia without behavioral disturbance: Secondary | ICD-10-CM | POA: Diagnosis not present

## 2014-02-18 ENCOUNTER — Telehealth: Payer: Self-pay | Admitting: *Deleted

## 2014-02-18 DIAGNOSIS — D509 Iron deficiency anemia, unspecified: Secondary | ICD-10-CM | POA: Diagnosis not present

## 2014-02-18 DIAGNOSIS — Z992 Dependence on renal dialysis: Secondary | ICD-10-CM | POA: Diagnosis not present

## 2014-02-18 DIAGNOSIS — N186 End stage renal disease: Secondary | ICD-10-CM | POA: Diagnosis not present

## 2014-02-18 DIAGNOSIS — F0390 Unspecified dementia without behavioral disturbance: Secondary | ICD-10-CM | POA: Diagnosis not present

## 2014-02-18 DIAGNOSIS — N2581 Secondary hyperparathyroidism of renal origin: Secondary | ICD-10-CM | POA: Diagnosis not present

## 2014-02-18 DIAGNOSIS — D631 Anemia in chronic kidney disease: Secondary | ICD-10-CM | POA: Diagnosis not present

## 2014-02-18 NOTE — Telephone Encounter (Signed)
Received request from pharmacy for PA on lidocaine-prilocaine.  PA submitted.   Dx: N18.6

## 2014-02-19 ENCOUNTER — Telehealth: Payer: Self-pay | Admitting: *Deleted

## 2014-02-19 DIAGNOSIS — F0390 Unspecified dementia without behavioral disturbance: Secondary | ICD-10-CM | POA: Diagnosis not present

## 2014-02-19 NOTE — Telephone Encounter (Signed)
Submitted humana referral thru acuity connect for authorization to Dr. Adriana Reams MD, ENT, with authorization [id number]   Dx: H91.93-Unspecified hearing loss,bilateral  Number of visits:6  Start Date: 03/21/2014 ending 09/17/14  Faxed papercopy to [facility]NT with authorization number

## 2014-02-20 DIAGNOSIS — D509 Iron deficiency anemia, unspecified: Secondary | ICD-10-CM | POA: Diagnosis not present

## 2014-02-20 DIAGNOSIS — D631 Anemia in chronic kidney disease: Secondary | ICD-10-CM | POA: Diagnosis not present

## 2014-02-20 DIAGNOSIS — F0390 Unspecified dementia without behavioral disturbance: Secondary | ICD-10-CM | POA: Diagnosis not present

## 2014-02-20 DIAGNOSIS — N186 End stage renal disease: Secondary | ICD-10-CM | POA: Diagnosis not present

## 2014-02-20 DIAGNOSIS — Z992 Dependence on renal dialysis: Secondary | ICD-10-CM | POA: Diagnosis not present

## 2014-02-20 DIAGNOSIS — N2581 Secondary hyperparathyroidism of renal origin: Secondary | ICD-10-CM | POA: Diagnosis not present

## 2014-02-21 DIAGNOSIS — F0390 Unspecified dementia without behavioral disturbance: Secondary | ICD-10-CM | POA: Diagnosis not present

## 2014-02-21 NOTE — Telephone Encounter (Signed)
Received PA determination.   PA approved through 02/21/2016.  Pharmacy made aware.

## 2014-02-22 DIAGNOSIS — D509 Iron deficiency anemia, unspecified: Secondary | ICD-10-CM | POA: Diagnosis not present

## 2014-02-22 DIAGNOSIS — N186 End stage renal disease: Secondary | ICD-10-CM | POA: Diagnosis not present

## 2014-02-22 DIAGNOSIS — D631 Anemia in chronic kidney disease: Secondary | ICD-10-CM | POA: Diagnosis not present

## 2014-02-22 DIAGNOSIS — Z992 Dependence on renal dialysis: Secondary | ICD-10-CM | POA: Diagnosis not present

## 2014-02-22 DIAGNOSIS — N2581 Secondary hyperparathyroidism of renal origin: Secondary | ICD-10-CM | POA: Diagnosis not present

## 2014-02-22 DIAGNOSIS — F0390 Unspecified dementia without behavioral disturbance: Secondary | ICD-10-CM | POA: Diagnosis not present

## 2014-02-23 DIAGNOSIS — F0390 Unspecified dementia without behavioral disturbance: Secondary | ICD-10-CM | POA: Diagnosis not present

## 2014-02-24 DIAGNOSIS — F0390 Unspecified dementia without behavioral disturbance: Secondary | ICD-10-CM | POA: Diagnosis not present

## 2014-02-25 DIAGNOSIS — N186 End stage renal disease: Secondary | ICD-10-CM | POA: Diagnosis not present

## 2014-02-25 DIAGNOSIS — F0390 Unspecified dementia without behavioral disturbance: Secondary | ICD-10-CM | POA: Diagnosis not present

## 2014-02-25 DIAGNOSIS — D631 Anemia in chronic kidney disease: Secondary | ICD-10-CM | POA: Diagnosis not present

## 2014-02-25 DIAGNOSIS — Z992 Dependence on renal dialysis: Secondary | ICD-10-CM | POA: Diagnosis not present

## 2014-02-25 DIAGNOSIS — N2581 Secondary hyperparathyroidism of renal origin: Secondary | ICD-10-CM | POA: Diagnosis not present

## 2014-02-25 DIAGNOSIS — D509 Iron deficiency anemia, unspecified: Secondary | ICD-10-CM | POA: Diagnosis not present

## 2014-02-26 DIAGNOSIS — F0390 Unspecified dementia without behavioral disturbance: Secondary | ICD-10-CM | POA: Diagnosis not present

## 2014-02-27 DIAGNOSIS — N186 End stage renal disease: Secondary | ICD-10-CM | POA: Diagnosis not present

## 2014-02-27 DIAGNOSIS — D631 Anemia in chronic kidney disease: Secondary | ICD-10-CM | POA: Diagnosis not present

## 2014-02-27 DIAGNOSIS — F0390 Unspecified dementia without behavioral disturbance: Secondary | ICD-10-CM | POA: Diagnosis not present

## 2014-02-27 DIAGNOSIS — D509 Iron deficiency anemia, unspecified: Secondary | ICD-10-CM | POA: Diagnosis not present

## 2014-02-27 DIAGNOSIS — N2581 Secondary hyperparathyroidism of renal origin: Secondary | ICD-10-CM | POA: Diagnosis not present

## 2014-02-27 DIAGNOSIS — Z992 Dependence on renal dialysis: Secondary | ICD-10-CM | POA: Diagnosis not present

## 2014-02-28 DIAGNOSIS — F0390 Unspecified dementia without behavioral disturbance: Secondary | ICD-10-CM | POA: Diagnosis not present

## 2014-03-01 DIAGNOSIS — N186 End stage renal disease: Secondary | ICD-10-CM | POA: Diagnosis not present

## 2014-03-01 DIAGNOSIS — N2581 Secondary hyperparathyroidism of renal origin: Secondary | ICD-10-CM | POA: Diagnosis not present

## 2014-03-01 DIAGNOSIS — F0390 Unspecified dementia without behavioral disturbance: Secondary | ICD-10-CM | POA: Diagnosis not present

## 2014-03-01 DIAGNOSIS — D509 Iron deficiency anemia, unspecified: Secondary | ICD-10-CM | POA: Diagnosis not present

## 2014-03-01 DIAGNOSIS — Z992 Dependence on renal dialysis: Secondary | ICD-10-CM | POA: Diagnosis not present

## 2014-03-01 DIAGNOSIS — D631 Anemia in chronic kidney disease: Secondary | ICD-10-CM | POA: Diagnosis not present

## 2014-03-02 DIAGNOSIS — F0390 Unspecified dementia without behavioral disturbance: Secondary | ICD-10-CM | POA: Diagnosis not present

## 2014-03-03 DIAGNOSIS — F0390 Unspecified dementia without behavioral disturbance: Secondary | ICD-10-CM | POA: Diagnosis not present

## 2014-03-04 DIAGNOSIS — F0390 Unspecified dementia without behavioral disturbance: Secondary | ICD-10-CM | POA: Diagnosis not present

## 2014-03-04 DIAGNOSIS — D631 Anemia in chronic kidney disease: Secondary | ICD-10-CM | POA: Diagnosis not present

## 2014-03-04 DIAGNOSIS — N186 End stage renal disease: Secondary | ICD-10-CM | POA: Diagnosis not present

## 2014-03-04 DIAGNOSIS — N2581 Secondary hyperparathyroidism of renal origin: Secondary | ICD-10-CM | POA: Diagnosis not present

## 2014-03-04 DIAGNOSIS — Z992 Dependence on renal dialysis: Secondary | ICD-10-CM | POA: Diagnosis not present

## 2014-03-04 DIAGNOSIS — D509 Iron deficiency anemia, unspecified: Secondary | ICD-10-CM | POA: Diagnosis not present

## 2014-03-05 DIAGNOSIS — F0390 Unspecified dementia without behavioral disturbance: Secondary | ICD-10-CM | POA: Diagnosis not present

## 2014-03-06 DIAGNOSIS — F0390 Unspecified dementia without behavioral disturbance: Secondary | ICD-10-CM | POA: Diagnosis not present

## 2014-03-06 DIAGNOSIS — D631 Anemia in chronic kidney disease: Secondary | ICD-10-CM | POA: Diagnosis not present

## 2014-03-06 DIAGNOSIS — N186 End stage renal disease: Secondary | ICD-10-CM | POA: Diagnosis not present

## 2014-03-06 DIAGNOSIS — N2581 Secondary hyperparathyroidism of renal origin: Secondary | ICD-10-CM | POA: Diagnosis not present

## 2014-03-06 DIAGNOSIS — D509 Iron deficiency anemia, unspecified: Secondary | ICD-10-CM | POA: Diagnosis not present

## 2014-03-06 DIAGNOSIS — Z992 Dependence on renal dialysis: Secondary | ICD-10-CM | POA: Diagnosis not present

## 2014-03-07 ENCOUNTER — Other Ambulatory Visit: Payer: Self-pay | Admitting: *Deleted

## 2014-03-07 DIAGNOSIS — H3581 Retinal edema: Secondary | ICD-10-CM | POA: Diagnosis not present

## 2014-03-07 DIAGNOSIS — E11329 Type 2 diabetes mellitus with mild nonproliferative diabetic retinopathy without macular edema: Secondary | ICD-10-CM | POA: Diagnosis not present

## 2014-03-07 DIAGNOSIS — Z961 Presence of intraocular lens: Secondary | ICD-10-CM | POA: Diagnosis not present

## 2014-03-07 DIAGNOSIS — F0390 Unspecified dementia without behavioral disturbance: Secondary | ICD-10-CM | POA: Diagnosis not present

## 2014-03-07 DIAGNOSIS — H35341 Macular cyst, hole, or pseudohole, right eye: Secondary | ICD-10-CM | POA: Diagnosis not present

## 2014-03-07 MED ORDER — AMLODIPINE BESYLATE 10 MG PO TABS: 10.0000 mg | ORAL_TABLET | ORAL | Status: DC

## 2014-03-07 NOTE — Telephone Encounter (Signed)
Received fax requesting refill on Norvasc.   Refill appropriate and filled per protocol.  

## 2014-03-08 DIAGNOSIS — N186 End stage renal disease: Secondary | ICD-10-CM | POA: Diagnosis not present

## 2014-03-08 DIAGNOSIS — D631 Anemia in chronic kidney disease: Secondary | ICD-10-CM | POA: Diagnosis not present

## 2014-03-08 DIAGNOSIS — Z992 Dependence on renal dialysis: Secondary | ICD-10-CM | POA: Diagnosis not present

## 2014-03-08 DIAGNOSIS — N2581 Secondary hyperparathyroidism of renal origin: Secondary | ICD-10-CM | POA: Diagnosis not present

## 2014-03-08 DIAGNOSIS — F0390 Unspecified dementia without behavioral disturbance: Secondary | ICD-10-CM | POA: Diagnosis not present

## 2014-03-08 DIAGNOSIS — D509 Iron deficiency anemia, unspecified: Secondary | ICD-10-CM | POA: Diagnosis not present

## 2014-03-09 DIAGNOSIS — F0390 Unspecified dementia without behavioral disturbance: Secondary | ICD-10-CM | POA: Diagnosis not present

## 2014-03-10 DIAGNOSIS — F0390 Unspecified dementia without behavioral disturbance: Secondary | ICD-10-CM | POA: Diagnosis not present

## 2014-03-11 DIAGNOSIS — N186 End stage renal disease: Secondary | ICD-10-CM | POA: Diagnosis not present

## 2014-03-11 DIAGNOSIS — D631 Anemia in chronic kidney disease: Secondary | ICD-10-CM | POA: Diagnosis not present

## 2014-03-11 DIAGNOSIS — F0390 Unspecified dementia without behavioral disturbance: Secondary | ICD-10-CM | POA: Diagnosis not present

## 2014-03-11 DIAGNOSIS — Z992 Dependence on renal dialysis: Secondary | ICD-10-CM | POA: Diagnosis not present

## 2014-03-11 DIAGNOSIS — N2581 Secondary hyperparathyroidism of renal origin: Secondary | ICD-10-CM | POA: Diagnosis not present

## 2014-03-11 DIAGNOSIS — D509 Iron deficiency anemia, unspecified: Secondary | ICD-10-CM | POA: Diagnosis not present

## 2014-03-12 DIAGNOSIS — F0390 Unspecified dementia without behavioral disturbance: Secondary | ICD-10-CM | POA: Diagnosis not present

## 2014-03-13 DIAGNOSIS — N2581 Secondary hyperparathyroidism of renal origin: Secondary | ICD-10-CM | POA: Diagnosis not present

## 2014-03-13 DIAGNOSIS — Z992 Dependence on renal dialysis: Secondary | ICD-10-CM | POA: Diagnosis not present

## 2014-03-13 DIAGNOSIS — N186 End stage renal disease: Secondary | ICD-10-CM | POA: Diagnosis not present

## 2014-03-13 DIAGNOSIS — F0390 Unspecified dementia without behavioral disturbance: Secondary | ICD-10-CM | POA: Diagnosis not present

## 2014-03-13 DIAGNOSIS — D509 Iron deficiency anemia, unspecified: Secondary | ICD-10-CM | POA: Diagnosis not present

## 2014-03-13 DIAGNOSIS — D631 Anemia in chronic kidney disease: Secondary | ICD-10-CM | POA: Diagnosis not present

## 2014-03-14 DIAGNOSIS — F0390 Unspecified dementia without behavioral disturbance: Secondary | ICD-10-CM | POA: Diagnosis not present

## 2014-03-15 DIAGNOSIS — D509 Iron deficiency anemia, unspecified: Secondary | ICD-10-CM | POA: Diagnosis not present

## 2014-03-15 DIAGNOSIS — N2581 Secondary hyperparathyroidism of renal origin: Secondary | ICD-10-CM | POA: Diagnosis not present

## 2014-03-15 DIAGNOSIS — Z992 Dependence on renal dialysis: Secondary | ICD-10-CM | POA: Diagnosis not present

## 2014-03-15 DIAGNOSIS — D631 Anemia in chronic kidney disease: Secondary | ICD-10-CM | POA: Diagnosis not present

## 2014-03-15 DIAGNOSIS — N186 End stage renal disease: Secondary | ICD-10-CM | POA: Diagnosis not present

## 2014-03-15 DIAGNOSIS — F0390 Unspecified dementia without behavioral disturbance: Secondary | ICD-10-CM | POA: Diagnosis not present

## 2014-03-16 DIAGNOSIS — F0390 Unspecified dementia without behavioral disturbance: Secondary | ICD-10-CM | POA: Diagnosis not present

## 2014-03-17 DIAGNOSIS — F0390 Unspecified dementia without behavioral disturbance: Secondary | ICD-10-CM | POA: Diagnosis not present

## 2014-03-18 DIAGNOSIS — Z992 Dependence on renal dialysis: Secondary | ICD-10-CM | POA: Diagnosis not present

## 2014-03-18 DIAGNOSIS — D631 Anemia in chronic kidney disease: Secondary | ICD-10-CM | POA: Diagnosis not present

## 2014-03-18 DIAGNOSIS — N2581 Secondary hyperparathyroidism of renal origin: Secondary | ICD-10-CM | POA: Diagnosis not present

## 2014-03-18 DIAGNOSIS — F0390 Unspecified dementia without behavioral disturbance: Secondary | ICD-10-CM | POA: Diagnosis not present

## 2014-03-18 DIAGNOSIS — N186 End stage renal disease: Secondary | ICD-10-CM | POA: Diagnosis not present

## 2014-03-18 DIAGNOSIS — D509 Iron deficiency anemia, unspecified: Secondary | ICD-10-CM | POA: Diagnosis not present

## 2014-03-19 DIAGNOSIS — F0390 Unspecified dementia without behavioral disturbance: Secondary | ICD-10-CM | POA: Diagnosis not present

## 2014-03-19 DIAGNOSIS — H34812 Central retinal vein occlusion, left eye: Secondary | ICD-10-CM | POA: Diagnosis not present

## 2014-03-19 DIAGNOSIS — H3581 Retinal edema: Secondary | ICD-10-CM | POA: Diagnosis not present

## 2014-03-19 DIAGNOSIS — E11329 Type 2 diabetes mellitus with mild nonproliferative diabetic retinopathy without macular edema: Secondary | ICD-10-CM | POA: Diagnosis not present

## 2014-03-20 DIAGNOSIS — N2581 Secondary hyperparathyroidism of renal origin: Secondary | ICD-10-CM | POA: Diagnosis not present

## 2014-03-20 DIAGNOSIS — F0390 Unspecified dementia without behavioral disturbance: Secondary | ICD-10-CM | POA: Diagnosis not present

## 2014-03-20 DIAGNOSIS — Z992 Dependence on renal dialysis: Secondary | ICD-10-CM | POA: Diagnosis not present

## 2014-03-20 DIAGNOSIS — D509 Iron deficiency anemia, unspecified: Secondary | ICD-10-CM | POA: Diagnosis not present

## 2014-03-20 DIAGNOSIS — D631 Anemia in chronic kidney disease: Secondary | ICD-10-CM | POA: Diagnosis not present

## 2014-03-20 DIAGNOSIS — N186 End stage renal disease: Secondary | ICD-10-CM | POA: Diagnosis not present

## 2014-03-21 DIAGNOSIS — F0390 Unspecified dementia without behavioral disturbance: Secondary | ICD-10-CM | POA: Diagnosis not present

## 2014-03-22 DIAGNOSIS — D631 Anemia in chronic kidney disease: Secondary | ICD-10-CM | POA: Diagnosis not present

## 2014-03-22 DIAGNOSIS — N186 End stage renal disease: Secondary | ICD-10-CM | POA: Diagnosis not present

## 2014-03-22 DIAGNOSIS — Z992 Dependence on renal dialysis: Secondary | ICD-10-CM | POA: Diagnosis not present

## 2014-03-22 DIAGNOSIS — N2581 Secondary hyperparathyroidism of renal origin: Secondary | ICD-10-CM | POA: Diagnosis not present

## 2014-03-22 DIAGNOSIS — D509 Iron deficiency anemia, unspecified: Secondary | ICD-10-CM | POA: Diagnosis not present

## 2014-03-22 DIAGNOSIS — F0390 Unspecified dementia without behavioral disturbance: Secondary | ICD-10-CM | POA: Diagnosis not present

## 2014-03-23 DIAGNOSIS — F0390 Unspecified dementia without behavioral disturbance: Secondary | ICD-10-CM | POA: Diagnosis not present

## 2014-03-24 DIAGNOSIS — F0390 Unspecified dementia without behavioral disturbance: Secondary | ICD-10-CM | POA: Diagnosis not present

## 2014-03-25 DIAGNOSIS — N2581 Secondary hyperparathyroidism of renal origin: Secondary | ICD-10-CM | POA: Diagnosis not present

## 2014-03-25 DIAGNOSIS — N186 End stage renal disease: Secondary | ICD-10-CM | POA: Diagnosis not present

## 2014-03-25 DIAGNOSIS — F0390 Unspecified dementia without behavioral disturbance: Secondary | ICD-10-CM | POA: Diagnosis not present

## 2014-03-25 DIAGNOSIS — D509 Iron deficiency anemia, unspecified: Secondary | ICD-10-CM | POA: Diagnosis not present

## 2014-03-25 DIAGNOSIS — Z992 Dependence on renal dialysis: Secondary | ICD-10-CM | POA: Diagnosis not present

## 2014-03-25 DIAGNOSIS — D631 Anemia in chronic kidney disease: Secondary | ICD-10-CM | POA: Diagnosis not present

## 2014-03-26 DIAGNOSIS — F0390 Unspecified dementia without behavioral disturbance: Secondary | ICD-10-CM | POA: Diagnosis not present

## 2014-03-27 DIAGNOSIS — N186 End stage renal disease: Secondary | ICD-10-CM | POA: Diagnosis not present

## 2014-03-27 DIAGNOSIS — F0390 Unspecified dementia without behavioral disturbance: Secondary | ICD-10-CM | POA: Diagnosis not present

## 2014-03-27 DIAGNOSIS — D509 Iron deficiency anemia, unspecified: Secondary | ICD-10-CM | POA: Diagnosis not present

## 2014-03-27 DIAGNOSIS — Z992 Dependence on renal dialysis: Secondary | ICD-10-CM | POA: Diagnosis not present

## 2014-03-27 DIAGNOSIS — N2581 Secondary hyperparathyroidism of renal origin: Secondary | ICD-10-CM | POA: Diagnosis not present

## 2014-03-27 DIAGNOSIS — D631 Anemia in chronic kidney disease: Secondary | ICD-10-CM | POA: Diagnosis not present

## 2014-03-28 DIAGNOSIS — F0390 Unspecified dementia without behavioral disturbance: Secondary | ICD-10-CM | POA: Diagnosis not present

## 2014-03-29 DIAGNOSIS — D509 Iron deficiency anemia, unspecified: Secondary | ICD-10-CM | POA: Diagnosis not present

## 2014-03-29 DIAGNOSIS — N186 End stage renal disease: Secondary | ICD-10-CM | POA: Diagnosis not present

## 2014-03-29 DIAGNOSIS — D631 Anemia in chronic kidney disease: Secondary | ICD-10-CM | POA: Diagnosis not present

## 2014-03-29 DIAGNOSIS — Z992 Dependence on renal dialysis: Secondary | ICD-10-CM | POA: Diagnosis not present

## 2014-03-29 DIAGNOSIS — N2581 Secondary hyperparathyroidism of renal origin: Secondary | ICD-10-CM | POA: Diagnosis not present

## 2014-03-29 DIAGNOSIS — F0390 Unspecified dementia without behavioral disturbance: Secondary | ICD-10-CM | POA: Diagnosis not present

## 2014-03-30 DIAGNOSIS — F0390 Unspecified dementia without behavioral disturbance: Secondary | ICD-10-CM | POA: Diagnosis not present

## 2014-03-31 DIAGNOSIS — F0390 Unspecified dementia without behavioral disturbance: Secondary | ICD-10-CM | POA: Diagnosis not present

## 2014-04-01 DIAGNOSIS — D509 Iron deficiency anemia, unspecified: Secondary | ICD-10-CM | POA: Diagnosis not present

## 2014-04-01 DIAGNOSIS — N186 End stage renal disease: Secondary | ICD-10-CM | POA: Diagnosis not present

## 2014-04-01 DIAGNOSIS — Z992 Dependence on renal dialysis: Secondary | ICD-10-CM | POA: Diagnosis not present

## 2014-04-01 DIAGNOSIS — F0390 Unspecified dementia without behavioral disturbance: Secondary | ICD-10-CM | POA: Diagnosis not present

## 2014-04-01 DIAGNOSIS — D631 Anemia in chronic kidney disease: Secondary | ICD-10-CM | POA: Diagnosis not present

## 2014-04-01 DIAGNOSIS — N2581 Secondary hyperparathyroidism of renal origin: Secondary | ICD-10-CM | POA: Diagnosis not present

## 2014-04-02 DIAGNOSIS — H3581 Retinal edema: Secondary | ICD-10-CM | POA: Diagnosis not present

## 2014-04-02 DIAGNOSIS — F0390 Unspecified dementia without behavioral disturbance: Secondary | ICD-10-CM | POA: Diagnosis not present

## 2014-04-02 DIAGNOSIS — H34812 Central retinal vein occlusion, left eye: Secondary | ICD-10-CM | POA: Diagnosis not present

## 2014-04-03 DIAGNOSIS — F0390 Unspecified dementia without behavioral disturbance: Secondary | ICD-10-CM | POA: Diagnosis not present

## 2014-04-03 DIAGNOSIS — D509 Iron deficiency anemia, unspecified: Secondary | ICD-10-CM | POA: Diagnosis not present

## 2014-04-03 DIAGNOSIS — N2581 Secondary hyperparathyroidism of renal origin: Secondary | ICD-10-CM | POA: Diagnosis not present

## 2014-04-03 DIAGNOSIS — D631 Anemia in chronic kidney disease: Secondary | ICD-10-CM | POA: Diagnosis not present

## 2014-04-03 DIAGNOSIS — Z992 Dependence on renal dialysis: Secondary | ICD-10-CM | POA: Diagnosis not present

## 2014-04-03 DIAGNOSIS — N186 End stage renal disease: Secondary | ICD-10-CM | POA: Diagnosis not present

## 2014-04-04 ENCOUNTER — Telehealth: Payer: Self-pay | Admitting: *Deleted

## 2014-04-04 DIAGNOSIS — F0390 Unspecified dementia without behavioral disturbance: Secondary | ICD-10-CM | POA: Diagnosis not present

## 2014-04-04 NOTE — Telephone Encounter (Signed)
Submitted humana referral thru acuity connect for authorization to Dr. 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

## 2014-04-05 DIAGNOSIS — D631 Anemia in chronic kidney disease: Secondary | ICD-10-CM | POA: Diagnosis not present

## 2014-04-05 DIAGNOSIS — F0390 Unspecified dementia without behavioral disturbance: Secondary | ICD-10-CM | POA: Diagnosis not present

## 2014-04-05 DIAGNOSIS — Z992 Dependence on renal dialysis: Secondary | ICD-10-CM | POA: Diagnosis not present

## 2014-04-05 DIAGNOSIS — N186 End stage renal disease: Secondary | ICD-10-CM | POA: Diagnosis not present

## 2014-04-05 DIAGNOSIS — D509 Iron deficiency anemia, unspecified: Secondary | ICD-10-CM | POA: Diagnosis not present

## 2014-04-05 DIAGNOSIS — N2581 Secondary hyperparathyroidism of renal origin: Secondary | ICD-10-CM | POA: Diagnosis not present

## 2014-04-06 DIAGNOSIS — F0390 Unspecified dementia without behavioral disturbance: Secondary | ICD-10-CM | POA: Diagnosis not present

## 2014-04-07 DIAGNOSIS — F0390 Unspecified dementia without behavioral disturbance: Secondary | ICD-10-CM | POA: Diagnosis not present

## 2014-04-08 DIAGNOSIS — N186 End stage renal disease: Secondary | ICD-10-CM | POA: Diagnosis not present

## 2014-04-08 DIAGNOSIS — D509 Iron deficiency anemia, unspecified: Secondary | ICD-10-CM | POA: Diagnosis not present

## 2014-04-08 DIAGNOSIS — D631 Anemia in chronic kidney disease: Secondary | ICD-10-CM | POA: Diagnosis not present

## 2014-04-08 DIAGNOSIS — F0390 Unspecified dementia without behavioral disturbance: Secondary | ICD-10-CM | POA: Diagnosis not present

## 2014-04-08 DIAGNOSIS — Z992 Dependence on renal dialysis: Secondary | ICD-10-CM | POA: Diagnosis not present

## 2014-04-08 DIAGNOSIS — N2581 Secondary hyperparathyroidism of renal origin: Secondary | ICD-10-CM | POA: Diagnosis not present

## 2014-04-09 DIAGNOSIS — F0390 Unspecified dementia without behavioral disturbance: Secondary | ICD-10-CM | POA: Diagnosis not present

## 2014-04-10 DIAGNOSIS — F0390 Unspecified dementia without behavioral disturbance: Secondary | ICD-10-CM | POA: Diagnosis not present

## 2014-04-10 DIAGNOSIS — N186 End stage renal disease: Secondary | ICD-10-CM | POA: Diagnosis not present

## 2014-04-10 DIAGNOSIS — D509 Iron deficiency anemia, unspecified: Secondary | ICD-10-CM | POA: Diagnosis not present

## 2014-04-10 DIAGNOSIS — D631 Anemia in chronic kidney disease: Secondary | ICD-10-CM | POA: Diagnosis not present

## 2014-04-10 DIAGNOSIS — N2581 Secondary hyperparathyroidism of renal origin: Secondary | ICD-10-CM | POA: Diagnosis not present

## 2014-04-10 DIAGNOSIS — Z992 Dependence on renal dialysis: Secondary | ICD-10-CM | POA: Diagnosis not present

## 2014-04-11 DIAGNOSIS — N186 End stage renal disease: Secondary | ICD-10-CM | POA: Diagnosis not present

## 2014-04-11 DIAGNOSIS — Z992 Dependence on renal dialysis: Secondary | ICD-10-CM | POA: Diagnosis not present

## 2014-04-11 DIAGNOSIS — F0390 Unspecified dementia without behavioral disturbance: Secondary | ICD-10-CM | POA: Diagnosis not present

## 2014-04-12 DIAGNOSIS — N186 End stage renal disease: Secondary | ICD-10-CM | POA: Diagnosis not present

## 2014-04-12 DIAGNOSIS — D509 Iron deficiency anemia, unspecified: Secondary | ICD-10-CM | POA: Diagnosis not present

## 2014-04-12 DIAGNOSIS — R159 Full incontinence of feces: Secondary | ICD-10-CM | POA: Diagnosis not present

## 2014-04-12 DIAGNOSIS — D631 Anemia in chronic kidney disease: Secondary | ICD-10-CM | POA: Diagnosis not present

## 2014-04-12 DIAGNOSIS — Z992 Dependence on renal dialysis: Secondary | ICD-10-CM | POA: Diagnosis not present

## 2014-04-12 DIAGNOSIS — R32 Unspecified urinary incontinence: Secondary | ICD-10-CM | POA: Diagnosis not present

## 2014-04-12 DIAGNOSIS — N2581 Secondary hyperparathyroidism of renal origin: Secondary | ICD-10-CM | POA: Diagnosis not present

## 2014-04-12 DIAGNOSIS — F0390 Unspecified dementia without behavioral disturbance: Secondary | ICD-10-CM | POA: Diagnosis not present

## 2014-04-13 DIAGNOSIS — F0390 Unspecified dementia without behavioral disturbance: Secondary | ICD-10-CM | POA: Diagnosis not present

## 2014-04-14 DIAGNOSIS — F0390 Unspecified dementia without behavioral disturbance: Secondary | ICD-10-CM | POA: Diagnosis not present

## 2014-04-15 DIAGNOSIS — Z992 Dependence on renal dialysis: Secondary | ICD-10-CM | POA: Diagnosis not present

## 2014-04-15 DIAGNOSIS — F0390 Unspecified dementia without behavioral disturbance: Secondary | ICD-10-CM | POA: Diagnosis not present

## 2014-04-15 DIAGNOSIS — N186 End stage renal disease: Secondary | ICD-10-CM | POA: Diagnosis not present

## 2014-04-15 DIAGNOSIS — D509 Iron deficiency anemia, unspecified: Secondary | ICD-10-CM | POA: Diagnosis not present

## 2014-04-15 DIAGNOSIS — D631 Anemia in chronic kidney disease: Secondary | ICD-10-CM | POA: Diagnosis not present

## 2014-04-15 DIAGNOSIS — N2581 Secondary hyperparathyroidism of renal origin: Secondary | ICD-10-CM | POA: Diagnosis not present

## 2014-04-16 DIAGNOSIS — F0390 Unspecified dementia without behavioral disturbance: Secondary | ICD-10-CM | POA: Diagnosis not present

## 2014-04-17 DIAGNOSIS — D631 Anemia in chronic kidney disease: Secondary | ICD-10-CM | POA: Diagnosis not present

## 2014-04-17 DIAGNOSIS — D509 Iron deficiency anemia, unspecified: Secondary | ICD-10-CM | POA: Diagnosis not present

## 2014-04-17 DIAGNOSIS — Z992 Dependence on renal dialysis: Secondary | ICD-10-CM | POA: Diagnosis not present

## 2014-04-17 DIAGNOSIS — N2581 Secondary hyperparathyroidism of renal origin: Secondary | ICD-10-CM | POA: Diagnosis not present

## 2014-04-17 DIAGNOSIS — F0390 Unspecified dementia without behavioral disturbance: Secondary | ICD-10-CM | POA: Diagnosis not present

## 2014-04-17 DIAGNOSIS — N186 End stage renal disease: Secondary | ICD-10-CM | POA: Diagnosis not present

## 2014-04-18 DIAGNOSIS — F0390 Unspecified dementia without behavioral disturbance: Secondary | ICD-10-CM | POA: Diagnosis not present

## 2014-04-19 DIAGNOSIS — D509 Iron deficiency anemia, unspecified: Secondary | ICD-10-CM | POA: Diagnosis not present

## 2014-04-19 DIAGNOSIS — N2581 Secondary hyperparathyroidism of renal origin: Secondary | ICD-10-CM | POA: Diagnosis not present

## 2014-04-19 DIAGNOSIS — D631 Anemia in chronic kidney disease: Secondary | ICD-10-CM | POA: Diagnosis not present

## 2014-04-19 DIAGNOSIS — Z992 Dependence on renal dialysis: Secondary | ICD-10-CM | POA: Diagnosis not present

## 2014-04-19 DIAGNOSIS — F0390 Unspecified dementia without behavioral disturbance: Secondary | ICD-10-CM | POA: Diagnosis not present

## 2014-04-19 DIAGNOSIS — N186 End stage renal disease: Secondary | ICD-10-CM | POA: Diagnosis not present

## 2014-04-20 DIAGNOSIS — F0390 Unspecified dementia without behavioral disturbance: Secondary | ICD-10-CM | POA: Diagnosis not present

## 2014-04-21 DIAGNOSIS — F0390 Unspecified dementia without behavioral disturbance: Secondary | ICD-10-CM | POA: Diagnosis not present

## 2014-04-22 DIAGNOSIS — D509 Iron deficiency anemia, unspecified: Secondary | ICD-10-CM | POA: Diagnosis not present

## 2014-04-22 DIAGNOSIS — F0390 Unspecified dementia without behavioral disturbance: Secondary | ICD-10-CM | POA: Diagnosis not present

## 2014-04-22 DIAGNOSIS — D631 Anemia in chronic kidney disease: Secondary | ICD-10-CM | POA: Diagnosis not present

## 2014-04-22 DIAGNOSIS — N186 End stage renal disease: Secondary | ICD-10-CM | POA: Diagnosis not present

## 2014-04-22 DIAGNOSIS — N2581 Secondary hyperparathyroidism of renal origin: Secondary | ICD-10-CM | POA: Diagnosis not present

## 2014-04-22 DIAGNOSIS — Z992 Dependence on renal dialysis: Secondary | ICD-10-CM | POA: Diagnosis not present

## 2014-04-23 DIAGNOSIS — F0390 Unspecified dementia without behavioral disturbance: Secondary | ICD-10-CM | POA: Diagnosis not present

## 2014-04-24 DIAGNOSIS — D631 Anemia in chronic kidney disease: Secondary | ICD-10-CM | POA: Diagnosis not present

## 2014-04-24 DIAGNOSIS — L84 Corns and callosities: Secondary | ICD-10-CM | POA: Diagnosis not present

## 2014-04-24 DIAGNOSIS — N2581 Secondary hyperparathyroidism of renal origin: Secondary | ICD-10-CM | POA: Diagnosis not present

## 2014-04-24 DIAGNOSIS — D509 Iron deficiency anemia, unspecified: Secondary | ICD-10-CM | POA: Diagnosis not present

## 2014-04-24 DIAGNOSIS — Z992 Dependence on renal dialysis: Secondary | ICD-10-CM | POA: Diagnosis not present

## 2014-04-24 DIAGNOSIS — F0390 Unspecified dementia without behavioral disturbance: Secondary | ICD-10-CM | POA: Diagnosis not present

## 2014-04-24 DIAGNOSIS — N186 End stage renal disease: Secondary | ICD-10-CM | POA: Diagnosis not present

## 2014-04-24 DIAGNOSIS — E1051 Type 1 diabetes mellitus with diabetic peripheral angiopathy without gangrene: Secondary | ICD-10-CM | POA: Diagnosis not present

## 2014-04-25 DIAGNOSIS — F0390 Unspecified dementia without behavioral disturbance: Secondary | ICD-10-CM | POA: Diagnosis not present

## 2014-04-26 DIAGNOSIS — D509 Iron deficiency anemia, unspecified: Secondary | ICD-10-CM | POA: Diagnosis not present

## 2014-04-26 DIAGNOSIS — D631 Anemia in chronic kidney disease: Secondary | ICD-10-CM | POA: Diagnosis not present

## 2014-04-26 DIAGNOSIS — F0390 Unspecified dementia without behavioral disturbance: Secondary | ICD-10-CM | POA: Diagnosis not present

## 2014-04-26 DIAGNOSIS — N2581 Secondary hyperparathyroidism of renal origin: Secondary | ICD-10-CM | POA: Diagnosis not present

## 2014-04-26 DIAGNOSIS — N186 End stage renal disease: Secondary | ICD-10-CM | POA: Diagnosis not present

## 2014-04-26 DIAGNOSIS — Z992 Dependence on renal dialysis: Secondary | ICD-10-CM | POA: Diagnosis not present

## 2014-04-27 DIAGNOSIS — F0390 Unspecified dementia without behavioral disturbance: Secondary | ICD-10-CM | POA: Diagnosis not present

## 2014-04-28 DIAGNOSIS — F0390 Unspecified dementia without behavioral disturbance: Secondary | ICD-10-CM | POA: Diagnosis not present

## 2014-04-29 DIAGNOSIS — D509 Iron deficiency anemia, unspecified: Secondary | ICD-10-CM | POA: Diagnosis not present

## 2014-04-29 DIAGNOSIS — N2581 Secondary hyperparathyroidism of renal origin: Secondary | ICD-10-CM | POA: Diagnosis not present

## 2014-04-29 DIAGNOSIS — D631 Anemia in chronic kidney disease: Secondary | ICD-10-CM | POA: Diagnosis not present

## 2014-04-29 DIAGNOSIS — Z992 Dependence on renal dialysis: Secondary | ICD-10-CM | POA: Diagnosis not present

## 2014-04-29 DIAGNOSIS — N186 End stage renal disease: Secondary | ICD-10-CM | POA: Diagnosis not present

## 2014-04-29 DIAGNOSIS — F0390 Unspecified dementia without behavioral disturbance: Secondary | ICD-10-CM | POA: Diagnosis not present

## 2014-04-30 DIAGNOSIS — F0390 Unspecified dementia without behavioral disturbance: Secondary | ICD-10-CM | POA: Diagnosis not present

## 2014-05-01 DIAGNOSIS — D509 Iron deficiency anemia, unspecified: Secondary | ICD-10-CM | POA: Diagnosis not present

## 2014-05-01 DIAGNOSIS — D631 Anemia in chronic kidney disease: Secondary | ICD-10-CM | POA: Diagnosis not present

## 2014-05-01 DIAGNOSIS — N186 End stage renal disease: Secondary | ICD-10-CM | POA: Diagnosis not present

## 2014-05-01 DIAGNOSIS — Z992 Dependence on renal dialysis: Secondary | ICD-10-CM | POA: Diagnosis not present

## 2014-05-01 DIAGNOSIS — F0390 Unspecified dementia without behavioral disturbance: Secondary | ICD-10-CM | POA: Diagnosis not present

## 2014-05-01 DIAGNOSIS — N2581 Secondary hyperparathyroidism of renal origin: Secondary | ICD-10-CM | POA: Diagnosis not present

## 2014-05-02 DIAGNOSIS — F0390 Unspecified dementia without behavioral disturbance: Secondary | ICD-10-CM | POA: Diagnosis not present

## 2014-05-03 DIAGNOSIS — D631 Anemia in chronic kidney disease: Secondary | ICD-10-CM | POA: Diagnosis not present

## 2014-05-03 DIAGNOSIS — D509 Iron deficiency anemia, unspecified: Secondary | ICD-10-CM | POA: Diagnosis not present

## 2014-05-03 DIAGNOSIS — Z992 Dependence on renal dialysis: Secondary | ICD-10-CM | POA: Diagnosis not present

## 2014-05-03 DIAGNOSIS — N186 End stage renal disease: Secondary | ICD-10-CM | POA: Diagnosis not present

## 2014-05-03 DIAGNOSIS — F0390 Unspecified dementia without behavioral disturbance: Secondary | ICD-10-CM | POA: Diagnosis not present

## 2014-05-03 DIAGNOSIS — N2581 Secondary hyperparathyroidism of renal origin: Secondary | ICD-10-CM | POA: Diagnosis not present

## 2014-05-04 DIAGNOSIS — F0390 Unspecified dementia without behavioral disturbance: Secondary | ICD-10-CM | POA: Diagnosis not present

## 2014-05-05 DIAGNOSIS — F0390 Unspecified dementia without behavioral disturbance: Secondary | ICD-10-CM | POA: Diagnosis not present

## 2014-05-06 DIAGNOSIS — N2581 Secondary hyperparathyroidism of renal origin: Secondary | ICD-10-CM | POA: Diagnosis not present

## 2014-05-06 DIAGNOSIS — Z992 Dependence on renal dialysis: Secondary | ICD-10-CM | POA: Diagnosis not present

## 2014-05-06 DIAGNOSIS — F0390 Unspecified dementia without behavioral disturbance: Secondary | ICD-10-CM | POA: Diagnosis not present

## 2014-05-06 DIAGNOSIS — N186 End stage renal disease: Secondary | ICD-10-CM | POA: Diagnosis not present

## 2014-05-06 DIAGNOSIS — D509 Iron deficiency anemia, unspecified: Secondary | ICD-10-CM | POA: Diagnosis not present

## 2014-05-06 DIAGNOSIS — D631 Anemia in chronic kidney disease: Secondary | ICD-10-CM | POA: Diagnosis not present

## 2014-05-07 ENCOUNTER — Other Ambulatory Visit: Payer: Self-pay | Admitting: *Deleted

## 2014-05-07 DIAGNOSIS — F0390 Unspecified dementia without behavioral disturbance: Secondary | ICD-10-CM | POA: Diagnosis not present

## 2014-05-07 DIAGNOSIS — H3581 Retinal edema: Secondary | ICD-10-CM | POA: Diagnosis not present

## 2014-05-07 DIAGNOSIS — H34812 Central retinal vein occlusion, left eye: Secondary | ICD-10-CM | POA: Diagnosis not present

## 2014-05-07 DIAGNOSIS — E11329 Type 2 diabetes mellitus with mild nonproliferative diabetic retinopathy without macular edema: Secondary | ICD-10-CM | POA: Diagnosis not present

## 2014-05-07 MED ORDER — CITALOPRAM HYDROBROMIDE 20 MG PO TABS: 20.0000 mg | ORAL_TABLET | ORAL | Status: DC

## 2014-05-07 MED ORDER — AMLODIPINE BESYLATE 10 MG PO TABS: 10.0000 mg | ORAL_TABLET | ORAL | Status: DC

## 2014-05-07 MED ORDER — LEVOTHYROXINE SODIUM 25 MCG PO TABS: 25.0000 ug | ORAL_TABLET | Freq: Every day | ORAL | Status: DC

## 2014-05-07 MED ORDER — ISOSORBIDE MONONITRATE ER 60 MG PO TB24: 60.0000 mg | ORAL_TABLET | Freq: Every day | ORAL | Status: DC

## 2014-05-07 NOTE — Telephone Encounter (Signed)
Received fax requesting refill on Celexa, Lisinopril, Norvasc, and Levothyroxine.   Refill appropriate and filled per protocol.

## 2014-05-08 DIAGNOSIS — N186 End stage renal disease: Secondary | ICD-10-CM | POA: Diagnosis not present

## 2014-05-08 DIAGNOSIS — D631 Anemia in chronic kidney disease: Secondary | ICD-10-CM | POA: Diagnosis not present

## 2014-05-08 DIAGNOSIS — D509 Iron deficiency anemia, unspecified: Secondary | ICD-10-CM | POA: Diagnosis not present

## 2014-05-08 DIAGNOSIS — Z992 Dependence on renal dialysis: Secondary | ICD-10-CM | POA: Diagnosis not present

## 2014-05-08 DIAGNOSIS — N2581 Secondary hyperparathyroidism of renal origin: Secondary | ICD-10-CM | POA: Diagnosis not present

## 2014-05-08 DIAGNOSIS — F0390 Unspecified dementia without behavioral disturbance: Secondary | ICD-10-CM | POA: Diagnosis not present

## 2014-05-09 DIAGNOSIS — F0390 Unspecified dementia without behavioral disturbance: Secondary | ICD-10-CM | POA: Diagnosis not present

## 2014-05-10 DIAGNOSIS — N186 End stage renal disease: Secondary | ICD-10-CM | POA: Diagnosis not present

## 2014-05-10 DIAGNOSIS — D631 Anemia in chronic kidney disease: Secondary | ICD-10-CM | POA: Diagnosis not present

## 2014-05-10 DIAGNOSIS — Z992 Dependence on renal dialysis: Secondary | ICD-10-CM | POA: Diagnosis not present

## 2014-05-10 DIAGNOSIS — F0390 Unspecified dementia without behavioral disturbance: Secondary | ICD-10-CM | POA: Diagnosis not present

## 2014-05-10 DIAGNOSIS — D509 Iron deficiency anemia, unspecified: Secondary | ICD-10-CM | POA: Diagnosis not present

## 2014-05-10 DIAGNOSIS — N2581 Secondary hyperparathyroidism of renal origin: Secondary | ICD-10-CM | POA: Diagnosis not present

## 2014-05-11 DIAGNOSIS — N186 End stage renal disease: Secondary | ICD-10-CM | POA: Diagnosis not present

## 2014-05-11 DIAGNOSIS — Z992 Dependence on renal dialysis: Secondary | ICD-10-CM | POA: Diagnosis not present

## 2014-05-11 DIAGNOSIS — F0390 Unspecified dementia without behavioral disturbance: Secondary | ICD-10-CM | POA: Diagnosis not present

## 2014-05-12 DIAGNOSIS — F0390 Unspecified dementia without behavioral disturbance: Secondary | ICD-10-CM | POA: Diagnosis not present

## 2014-05-12 DIAGNOSIS — R32 Unspecified urinary incontinence: Secondary | ICD-10-CM | POA: Diagnosis not present

## 2014-05-12 DIAGNOSIS — R159 Full incontinence of feces: Secondary | ICD-10-CM | POA: Diagnosis not present

## 2014-05-13 DIAGNOSIS — N186 End stage renal disease: Secondary | ICD-10-CM | POA: Diagnosis not present

## 2014-05-13 DIAGNOSIS — D509 Iron deficiency anemia, unspecified: Secondary | ICD-10-CM | POA: Diagnosis not present

## 2014-05-13 DIAGNOSIS — D631 Anemia in chronic kidney disease: Secondary | ICD-10-CM | POA: Diagnosis not present

## 2014-05-13 DIAGNOSIS — F0390 Unspecified dementia without behavioral disturbance: Secondary | ICD-10-CM | POA: Diagnosis not present

## 2014-05-13 DIAGNOSIS — N2581 Secondary hyperparathyroidism of renal origin: Secondary | ICD-10-CM | POA: Diagnosis not present

## 2014-05-13 DIAGNOSIS — Z992 Dependence on renal dialysis: Secondary | ICD-10-CM | POA: Diagnosis not present

## 2014-05-14 DIAGNOSIS — F0390 Unspecified dementia without behavioral disturbance: Secondary | ICD-10-CM | POA: Diagnosis not present

## 2014-05-15 DIAGNOSIS — N186 End stage renal disease: Secondary | ICD-10-CM | POA: Diagnosis not present

## 2014-05-15 DIAGNOSIS — F0390 Unspecified dementia without behavioral disturbance: Secondary | ICD-10-CM | POA: Diagnosis not present

## 2014-05-15 DIAGNOSIS — D509 Iron deficiency anemia, unspecified: Secondary | ICD-10-CM | POA: Diagnosis not present

## 2014-05-15 DIAGNOSIS — Z992 Dependence on renal dialysis: Secondary | ICD-10-CM | POA: Diagnosis not present

## 2014-05-15 DIAGNOSIS — N2581 Secondary hyperparathyroidism of renal origin: Secondary | ICD-10-CM | POA: Diagnosis not present

## 2014-05-15 DIAGNOSIS — D631 Anemia in chronic kidney disease: Secondary | ICD-10-CM | POA: Diagnosis not present

## 2014-05-16 DIAGNOSIS — F0390 Unspecified dementia without behavioral disturbance: Secondary | ICD-10-CM | POA: Diagnosis not present

## 2014-05-17 DIAGNOSIS — D631 Anemia in chronic kidney disease: Secondary | ICD-10-CM | POA: Diagnosis not present

## 2014-05-17 DIAGNOSIS — N2581 Secondary hyperparathyroidism of renal origin: Secondary | ICD-10-CM | POA: Diagnosis not present

## 2014-05-17 DIAGNOSIS — F0390 Unspecified dementia without behavioral disturbance: Secondary | ICD-10-CM | POA: Diagnosis not present

## 2014-05-17 DIAGNOSIS — D509 Iron deficiency anemia, unspecified: Secondary | ICD-10-CM | POA: Diagnosis not present

## 2014-05-17 DIAGNOSIS — Z992 Dependence on renal dialysis: Secondary | ICD-10-CM | POA: Diagnosis not present

## 2014-05-17 DIAGNOSIS — N186 End stage renal disease: Secondary | ICD-10-CM | POA: Diagnosis not present

## 2014-05-18 DIAGNOSIS — F0390 Unspecified dementia without behavioral disturbance: Secondary | ICD-10-CM | POA: Diagnosis not present

## 2014-05-19 DIAGNOSIS — F0390 Unspecified dementia without behavioral disturbance: Secondary | ICD-10-CM | POA: Diagnosis not present

## 2014-05-20 DIAGNOSIS — Z992 Dependence on renal dialysis: Secondary | ICD-10-CM | POA: Diagnosis not present

## 2014-05-20 DIAGNOSIS — N186 End stage renal disease: Secondary | ICD-10-CM | POA: Diagnosis not present

## 2014-05-20 DIAGNOSIS — N2581 Secondary hyperparathyroidism of renal origin: Secondary | ICD-10-CM | POA: Diagnosis not present

## 2014-05-20 DIAGNOSIS — D631 Anemia in chronic kidney disease: Secondary | ICD-10-CM | POA: Diagnosis not present

## 2014-05-20 DIAGNOSIS — F0390 Unspecified dementia without behavioral disturbance: Secondary | ICD-10-CM | POA: Diagnosis not present

## 2014-05-20 DIAGNOSIS — D509 Iron deficiency anemia, unspecified: Secondary | ICD-10-CM | POA: Diagnosis not present

## 2014-05-21 DIAGNOSIS — F0390 Unspecified dementia without behavioral disturbance: Secondary | ICD-10-CM | POA: Diagnosis not present

## 2014-05-22 DIAGNOSIS — D631 Anemia in chronic kidney disease: Secondary | ICD-10-CM | POA: Diagnosis not present

## 2014-05-22 DIAGNOSIS — F0390 Unspecified dementia without behavioral disturbance: Secondary | ICD-10-CM | POA: Diagnosis not present

## 2014-05-22 DIAGNOSIS — N2581 Secondary hyperparathyroidism of renal origin: Secondary | ICD-10-CM | POA: Diagnosis not present

## 2014-05-22 DIAGNOSIS — Z992 Dependence on renal dialysis: Secondary | ICD-10-CM | POA: Diagnosis not present

## 2014-05-22 DIAGNOSIS — D509 Iron deficiency anemia, unspecified: Secondary | ICD-10-CM | POA: Diagnosis not present

## 2014-05-22 DIAGNOSIS — N186 End stage renal disease: Secondary | ICD-10-CM | POA: Diagnosis not present

## 2014-05-23 DIAGNOSIS — F0390 Unspecified dementia without behavioral disturbance: Secondary | ICD-10-CM | POA: Diagnosis not present

## 2014-05-24 DIAGNOSIS — F0390 Unspecified dementia without behavioral disturbance: Secondary | ICD-10-CM | POA: Diagnosis not present

## 2014-05-24 DIAGNOSIS — N186 End stage renal disease: Secondary | ICD-10-CM | POA: Diagnosis not present

## 2014-05-24 DIAGNOSIS — D509 Iron deficiency anemia, unspecified: Secondary | ICD-10-CM | POA: Diagnosis not present

## 2014-05-24 DIAGNOSIS — Z992 Dependence on renal dialysis: Secondary | ICD-10-CM | POA: Diagnosis not present

## 2014-05-24 DIAGNOSIS — N2581 Secondary hyperparathyroidism of renal origin: Secondary | ICD-10-CM | POA: Diagnosis not present

## 2014-05-24 DIAGNOSIS — D631 Anemia in chronic kidney disease: Secondary | ICD-10-CM | POA: Diagnosis not present

## 2014-05-25 DIAGNOSIS — F0390 Unspecified dementia without behavioral disturbance: Secondary | ICD-10-CM | POA: Diagnosis not present

## 2014-05-26 DIAGNOSIS — F0390 Unspecified dementia without behavioral disturbance: Secondary | ICD-10-CM | POA: Diagnosis not present

## 2014-05-27 DIAGNOSIS — F0390 Unspecified dementia without behavioral disturbance: Secondary | ICD-10-CM | POA: Diagnosis not present

## 2014-05-27 DIAGNOSIS — Z992 Dependence on renal dialysis: Secondary | ICD-10-CM | POA: Diagnosis not present

## 2014-05-27 DIAGNOSIS — N2581 Secondary hyperparathyroidism of renal origin: Secondary | ICD-10-CM | POA: Diagnosis not present

## 2014-05-27 DIAGNOSIS — D631 Anemia in chronic kidney disease: Secondary | ICD-10-CM | POA: Diagnosis not present

## 2014-05-27 DIAGNOSIS — N186 End stage renal disease: Secondary | ICD-10-CM | POA: Diagnosis not present

## 2014-05-27 DIAGNOSIS — D509 Iron deficiency anemia, unspecified: Secondary | ICD-10-CM | POA: Diagnosis not present

## 2014-05-28 ENCOUNTER — Encounter: Payer: Self-pay | Admitting: Family Medicine

## 2014-05-28 DIAGNOSIS — F0390 Unspecified dementia without behavioral disturbance: Secondary | ICD-10-CM | POA: Diagnosis not present

## 2014-05-29 DIAGNOSIS — Z992 Dependence on renal dialysis: Secondary | ICD-10-CM | POA: Diagnosis not present

## 2014-05-29 DIAGNOSIS — F0390 Unspecified dementia without behavioral disturbance: Secondary | ICD-10-CM | POA: Diagnosis not present

## 2014-05-29 DIAGNOSIS — N2581 Secondary hyperparathyroidism of renal origin: Secondary | ICD-10-CM | POA: Diagnosis not present

## 2014-05-29 DIAGNOSIS — D509 Iron deficiency anemia, unspecified: Secondary | ICD-10-CM | POA: Diagnosis not present

## 2014-05-29 DIAGNOSIS — N186 End stage renal disease: Secondary | ICD-10-CM | POA: Diagnosis not present

## 2014-05-29 DIAGNOSIS — D631 Anemia in chronic kidney disease: Secondary | ICD-10-CM | POA: Diagnosis not present

## 2014-05-30 DIAGNOSIS — F0390 Unspecified dementia without behavioral disturbance: Secondary | ICD-10-CM | POA: Diagnosis not present

## 2014-05-31 DIAGNOSIS — D509 Iron deficiency anemia, unspecified: Secondary | ICD-10-CM | POA: Diagnosis not present

## 2014-05-31 DIAGNOSIS — F0390 Unspecified dementia without behavioral disturbance: Secondary | ICD-10-CM | POA: Diagnosis not present

## 2014-05-31 DIAGNOSIS — N2581 Secondary hyperparathyroidism of renal origin: Secondary | ICD-10-CM | POA: Diagnosis not present

## 2014-05-31 DIAGNOSIS — Z992 Dependence on renal dialysis: Secondary | ICD-10-CM | POA: Diagnosis not present

## 2014-05-31 DIAGNOSIS — N186 End stage renal disease: Secondary | ICD-10-CM | POA: Diagnosis not present

## 2014-05-31 DIAGNOSIS — D631 Anemia in chronic kidney disease: Secondary | ICD-10-CM | POA: Diagnosis not present

## 2014-06-01 DIAGNOSIS — F0390 Unspecified dementia without behavioral disturbance: Secondary | ICD-10-CM | POA: Diagnosis not present

## 2014-06-02 DIAGNOSIS — F0390 Unspecified dementia without behavioral disturbance: Secondary | ICD-10-CM | POA: Diagnosis not present

## 2014-06-03 DIAGNOSIS — F0390 Unspecified dementia without behavioral disturbance: Secondary | ICD-10-CM | POA: Diagnosis not present

## 2014-06-03 DIAGNOSIS — D631 Anemia in chronic kidney disease: Secondary | ICD-10-CM | POA: Diagnosis not present

## 2014-06-03 DIAGNOSIS — Z992 Dependence on renal dialysis: Secondary | ICD-10-CM | POA: Diagnosis not present

## 2014-06-03 DIAGNOSIS — D509 Iron deficiency anemia, unspecified: Secondary | ICD-10-CM | POA: Diagnosis not present

## 2014-06-03 DIAGNOSIS — N186 End stage renal disease: Secondary | ICD-10-CM | POA: Diagnosis not present

## 2014-06-03 DIAGNOSIS — N2581 Secondary hyperparathyroidism of renal origin: Secondary | ICD-10-CM | POA: Diagnosis not present

## 2014-06-04 DIAGNOSIS — F0390 Unspecified dementia without behavioral disturbance: Secondary | ICD-10-CM | POA: Diagnosis not present

## 2014-06-04 DIAGNOSIS — H34812 Central retinal vein occlusion, left eye: Secondary | ICD-10-CM | POA: Diagnosis not present

## 2014-06-04 DIAGNOSIS — E11321 Type 2 diabetes mellitus with mild nonproliferative diabetic retinopathy with macular edema: Secondary | ICD-10-CM | POA: Diagnosis not present

## 2014-06-04 DIAGNOSIS — H3581 Retinal edema: Secondary | ICD-10-CM | POA: Diagnosis not present

## 2014-06-05 DIAGNOSIS — Z992 Dependence on renal dialysis: Secondary | ICD-10-CM | POA: Diagnosis not present

## 2014-06-05 DIAGNOSIS — N2581 Secondary hyperparathyroidism of renal origin: Secondary | ICD-10-CM | POA: Diagnosis not present

## 2014-06-05 DIAGNOSIS — F0390 Unspecified dementia without behavioral disturbance: Secondary | ICD-10-CM | POA: Diagnosis not present

## 2014-06-05 DIAGNOSIS — D509 Iron deficiency anemia, unspecified: Secondary | ICD-10-CM | POA: Diagnosis not present

## 2014-06-05 DIAGNOSIS — N186 End stage renal disease: Secondary | ICD-10-CM | POA: Diagnosis not present

## 2014-06-05 DIAGNOSIS — D631 Anemia in chronic kidney disease: Secondary | ICD-10-CM | POA: Diagnosis not present

## 2014-06-06 DIAGNOSIS — F0390 Unspecified dementia without behavioral disturbance: Secondary | ICD-10-CM | POA: Diagnosis not present

## 2014-06-07 DIAGNOSIS — D631 Anemia in chronic kidney disease: Secondary | ICD-10-CM | POA: Diagnosis not present

## 2014-06-07 DIAGNOSIS — N2581 Secondary hyperparathyroidism of renal origin: Secondary | ICD-10-CM | POA: Diagnosis not present

## 2014-06-07 DIAGNOSIS — Z992 Dependence on renal dialysis: Secondary | ICD-10-CM | POA: Diagnosis not present

## 2014-06-07 DIAGNOSIS — F0390 Unspecified dementia without behavioral disturbance: Secondary | ICD-10-CM | POA: Diagnosis not present

## 2014-06-07 DIAGNOSIS — N186 End stage renal disease: Secondary | ICD-10-CM | POA: Diagnosis not present

## 2014-06-07 DIAGNOSIS — D509 Iron deficiency anemia, unspecified: Secondary | ICD-10-CM | POA: Diagnosis not present

## 2014-06-08 DIAGNOSIS — F0390 Unspecified dementia without behavioral disturbance: Secondary | ICD-10-CM | POA: Diagnosis not present

## 2014-06-09 DIAGNOSIS — F0390 Unspecified dementia without behavioral disturbance: Secondary | ICD-10-CM | POA: Diagnosis not present

## 2014-06-10 DIAGNOSIS — D631 Anemia in chronic kidney disease: Secondary | ICD-10-CM | POA: Diagnosis not present

## 2014-06-10 DIAGNOSIS — D509 Iron deficiency anemia, unspecified: Secondary | ICD-10-CM | POA: Diagnosis not present

## 2014-06-10 DIAGNOSIS — F0390 Unspecified dementia without behavioral disturbance: Secondary | ICD-10-CM | POA: Diagnosis not present

## 2014-06-10 DIAGNOSIS — Z992 Dependence on renal dialysis: Secondary | ICD-10-CM | POA: Diagnosis not present

## 2014-06-10 DIAGNOSIS — N186 End stage renal disease: Secondary | ICD-10-CM | POA: Diagnosis not present

## 2014-06-10 DIAGNOSIS — N2581 Secondary hyperparathyroidism of renal origin: Secondary | ICD-10-CM | POA: Diagnosis not present

## 2014-06-11 ENCOUNTER — Other Ambulatory Visit: Payer: Self-pay | Admitting: *Deleted

## 2014-06-11 DIAGNOSIS — F0390 Unspecified dementia without behavioral disturbance: Secondary | ICD-10-CM | POA: Diagnosis not present

## 2014-06-11 DIAGNOSIS — N186 End stage renal disease: Secondary | ICD-10-CM | POA: Diagnosis not present

## 2014-06-11 DIAGNOSIS — Z992 Dependence on renal dialysis: Secondary | ICD-10-CM | POA: Diagnosis not present

## 2014-06-11 MED ORDER — LIDOCAINE-PRILOCAINE 2.5-2.5 % EX CREA
TOPICAL_CREAM | CUTANEOUS | Status: DC | PRN
Start: 2014-06-11 — End: 2015-02-28

## 2014-06-11 NOTE — Telephone Encounter (Signed)
Received fax requesting refill on ELMA Cream.   Refill appropriate and filled per protocol.

## 2014-06-12 DIAGNOSIS — D631 Anemia in chronic kidney disease: Secondary | ICD-10-CM | POA: Diagnosis not present

## 2014-06-12 DIAGNOSIS — N186 End stage renal disease: Secondary | ICD-10-CM | POA: Diagnosis not present

## 2014-06-12 DIAGNOSIS — N2581 Secondary hyperparathyroidism of renal origin: Secondary | ICD-10-CM | POA: Diagnosis not present

## 2014-06-12 DIAGNOSIS — Z992 Dependence on renal dialysis: Secondary | ICD-10-CM | POA: Diagnosis not present

## 2014-06-12 DIAGNOSIS — F0390 Unspecified dementia without behavioral disturbance: Secondary | ICD-10-CM | POA: Diagnosis not present

## 2014-06-12 DIAGNOSIS — R32 Unspecified urinary incontinence: Secondary | ICD-10-CM | POA: Diagnosis not present

## 2014-06-12 DIAGNOSIS — R159 Full incontinence of feces: Secondary | ICD-10-CM | POA: Diagnosis not present

## 2014-06-12 DIAGNOSIS — D509 Iron deficiency anemia, unspecified: Secondary | ICD-10-CM | POA: Diagnosis not present

## 2014-06-13 DIAGNOSIS — F0390 Unspecified dementia without behavioral disturbance: Secondary | ICD-10-CM | POA: Diagnosis not present

## 2014-06-14 ENCOUNTER — Ambulatory Visit: Payer: Medicare HMO | Admitting: Family Medicine

## 2014-06-14 DIAGNOSIS — D509 Iron deficiency anemia, unspecified: Secondary | ICD-10-CM | POA: Diagnosis not present

## 2014-06-14 DIAGNOSIS — N186 End stage renal disease: Secondary | ICD-10-CM | POA: Diagnosis not present

## 2014-06-14 DIAGNOSIS — N2581 Secondary hyperparathyroidism of renal origin: Secondary | ICD-10-CM | POA: Diagnosis not present

## 2014-06-14 DIAGNOSIS — D631 Anemia in chronic kidney disease: Secondary | ICD-10-CM | POA: Diagnosis not present

## 2014-06-14 DIAGNOSIS — F0390 Unspecified dementia without behavioral disturbance: Secondary | ICD-10-CM | POA: Diagnosis not present

## 2014-06-14 DIAGNOSIS — Z992 Dependence on renal dialysis: Secondary | ICD-10-CM | POA: Diagnosis not present

## 2014-06-15 DIAGNOSIS — F0390 Unspecified dementia without behavioral disturbance: Secondary | ICD-10-CM | POA: Diagnosis not present

## 2014-06-16 DIAGNOSIS — F0390 Unspecified dementia without behavioral disturbance: Secondary | ICD-10-CM | POA: Diagnosis not present

## 2014-06-17 DIAGNOSIS — F0390 Unspecified dementia without behavioral disturbance: Secondary | ICD-10-CM | POA: Diagnosis not present

## 2014-06-17 DIAGNOSIS — D509 Iron deficiency anemia, unspecified: Secondary | ICD-10-CM | POA: Diagnosis not present

## 2014-06-17 DIAGNOSIS — N186 End stage renal disease: Secondary | ICD-10-CM | POA: Diagnosis not present

## 2014-06-17 DIAGNOSIS — N2581 Secondary hyperparathyroidism of renal origin: Secondary | ICD-10-CM | POA: Diagnosis not present

## 2014-06-17 DIAGNOSIS — D631 Anemia in chronic kidney disease: Secondary | ICD-10-CM | POA: Diagnosis not present

## 2014-06-17 DIAGNOSIS — Z992 Dependence on renal dialysis: Secondary | ICD-10-CM | POA: Diagnosis not present

## 2014-06-18 ENCOUNTER — Ambulatory Visit: Payer: Medicare HMO | Admitting: Family Medicine

## 2014-06-18 DIAGNOSIS — F0390 Unspecified dementia without behavioral disturbance: Secondary | ICD-10-CM | POA: Diagnosis not present

## 2014-06-19 ENCOUNTER — Encounter: Payer: Self-pay | Admitting: Family Medicine

## 2014-06-19 DIAGNOSIS — F0390 Unspecified dementia without behavioral disturbance: Secondary | ICD-10-CM | POA: Diagnosis not present

## 2014-06-19 DIAGNOSIS — D509 Iron deficiency anemia, unspecified: Secondary | ICD-10-CM | POA: Diagnosis not present

## 2014-06-19 DIAGNOSIS — N2581 Secondary hyperparathyroidism of renal origin: Secondary | ICD-10-CM | POA: Diagnosis not present

## 2014-06-19 DIAGNOSIS — D631 Anemia in chronic kidney disease: Secondary | ICD-10-CM | POA: Diagnosis not present

## 2014-06-19 DIAGNOSIS — Z992 Dependence on renal dialysis: Secondary | ICD-10-CM | POA: Diagnosis not present

## 2014-06-19 DIAGNOSIS — N186 End stage renal disease: Secondary | ICD-10-CM | POA: Diagnosis not present

## 2014-06-20 DIAGNOSIS — F0390 Unspecified dementia without behavioral disturbance: Secondary | ICD-10-CM | POA: Diagnosis not present

## 2014-06-21 DIAGNOSIS — F0390 Unspecified dementia without behavioral disturbance: Secondary | ICD-10-CM | POA: Diagnosis not present

## 2014-06-21 DIAGNOSIS — N2581 Secondary hyperparathyroidism of renal origin: Secondary | ICD-10-CM | POA: Diagnosis not present

## 2014-06-21 DIAGNOSIS — D509 Iron deficiency anemia, unspecified: Secondary | ICD-10-CM | POA: Diagnosis not present

## 2014-06-21 DIAGNOSIS — D631 Anemia in chronic kidney disease: Secondary | ICD-10-CM | POA: Diagnosis not present

## 2014-06-21 DIAGNOSIS — Z992 Dependence on renal dialysis: Secondary | ICD-10-CM | POA: Diagnosis not present

## 2014-06-21 DIAGNOSIS — N186 End stage renal disease: Secondary | ICD-10-CM | POA: Diagnosis not present

## 2014-06-22 DIAGNOSIS — F0390 Unspecified dementia without behavioral disturbance: Secondary | ICD-10-CM | POA: Diagnosis not present

## 2014-06-23 DIAGNOSIS — F0390 Unspecified dementia without behavioral disturbance: Secondary | ICD-10-CM | POA: Diagnosis not present

## 2014-06-24 DIAGNOSIS — N186 End stage renal disease: Secondary | ICD-10-CM | POA: Diagnosis not present

## 2014-06-24 DIAGNOSIS — D631 Anemia in chronic kidney disease: Secondary | ICD-10-CM | POA: Diagnosis not present

## 2014-06-24 DIAGNOSIS — D509 Iron deficiency anemia, unspecified: Secondary | ICD-10-CM | POA: Diagnosis not present

## 2014-06-24 DIAGNOSIS — Z992 Dependence on renal dialysis: Secondary | ICD-10-CM | POA: Diagnosis not present

## 2014-06-24 DIAGNOSIS — N2581 Secondary hyperparathyroidism of renal origin: Secondary | ICD-10-CM | POA: Diagnosis not present

## 2014-06-24 DIAGNOSIS — F0390 Unspecified dementia without behavioral disturbance: Secondary | ICD-10-CM | POA: Diagnosis not present

## 2014-06-25 ENCOUNTER — Ambulatory Visit: Payer: Commercial Managed Care - HMO | Admitting: Family Medicine

## 2014-06-25 DIAGNOSIS — F0390 Unspecified dementia without behavioral disturbance: Secondary | ICD-10-CM | POA: Diagnosis not present

## 2014-06-26 DIAGNOSIS — F0390 Unspecified dementia without behavioral disturbance: Secondary | ICD-10-CM | POA: Diagnosis not present

## 2014-06-26 DIAGNOSIS — D509 Iron deficiency anemia, unspecified: Secondary | ICD-10-CM | POA: Diagnosis not present

## 2014-06-26 DIAGNOSIS — N2581 Secondary hyperparathyroidism of renal origin: Secondary | ICD-10-CM | POA: Diagnosis not present

## 2014-06-26 DIAGNOSIS — D631 Anemia in chronic kidney disease: Secondary | ICD-10-CM | POA: Diagnosis not present

## 2014-06-26 DIAGNOSIS — Z992 Dependence on renal dialysis: Secondary | ICD-10-CM | POA: Diagnosis not present

## 2014-06-26 DIAGNOSIS — N186 End stage renal disease: Secondary | ICD-10-CM | POA: Diagnosis not present

## 2014-06-27 DIAGNOSIS — F0390 Unspecified dementia without behavioral disturbance: Secondary | ICD-10-CM | POA: Diagnosis not present

## 2014-06-28 DIAGNOSIS — F0390 Unspecified dementia without behavioral disturbance: Secondary | ICD-10-CM | POA: Diagnosis not present

## 2014-06-28 DIAGNOSIS — D631 Anemia in chronic kidney disease: Secondary | ICD-10-CM | POA: Diagnosis not present

## 2014-06-28 DIAGNOSIS — Z992 Dependence on renal dialysis: Secondary | ICD-10-CM | POA: Diagnosis not present

## 2014-06-28 DIAGNOSIS — N2581 Secondary hyperparathyroidism of renal origin: Secondary | ICD-10-CM | POA: Diagnosis not present

## 2014-06-28 DIAGNOSIS — D509 Iron deficiency anemia, unspecified: Secondary | ICD-10-CM | POA: Diagnosis not present

## 2014-06-28 DIAGNOSIS — N186 End stage renal disease: Secondary | ICD-10-CM | POA: Diagnosis not present

## 2014-06-29 DIAGNOSIS — F0390 Unspecified dementia without behavioral disturbance: Secondary | ICD-10-CM | POA: Diagnosis not present

## 2014-06-30 DIAGNOSIS — F0390 Unspecified dementia without behavioral disturbance: Secondary | ICD-10-CM | POA: Diagnosis not present

## 2014-07-01 DIAGNOSIS — N2581 Secondary hyperparathyroidism of renal origin: Secondary | ICD-10-CM | POA: Diagnosis not present

## 2014-07-01 DIAGNOSIS — D631 Anemia in chronic kidney disease: Secondary | ICD-10-CM | POA: Diagnosis not present

## 2014-07-01 DIAGNOSIS — F0390 Unspecified dementia without behavioral disturbance: Secondary | ICD-10-CM | POA: Diagnosis not present

## 2014-07-01 DIAGNOSIS — Z992 Dependence on renal dialysis: Secondary | ICD-10-CM | POA: Diagnosis not present

## 2014-07-01 DIAGNOSIS — N186 End stage renal disease: Secondary | ICD-10-CM | POA: Diagnosis not present

## 2014-07-01 DIAGNOSIS — D509 Iron deficiency anemia, unspecified: Secondary | ICD-10-CM | POA: Diagnosis not present

## 2014-07-02 DIAGNOSIS — F0390 Unspecified dementia without behavioral disturbance: Secondary | ICD-10-CM | POA: Diagnosis not present

## 2014-07-02 DIAGNOSIS — H3581 Retinal edema: Secondary | ICD-10-CM | POA: Diagnosis not present

## 2014-07-02 DIAGNOSIS — H34812 Central retinal vein occlusion, left eye: Secondary | ICD-10-CM | POA: Diagnosis not present

## 2014-07-02 DIAGNOSIS — H43813 Vitreous degeneration, bilateral: Secondary | ICD-10-CM | POA: Diagnosis not present

## 2014-07-02 DIAGNOSIS — E11321 Type 2 diabetes mellitus with mild nonproliferative diabetic retinopathy with macular edema: Secondary | ICD-10-CM | POA: Diagnosis not present

## 2014-07-03 DIAGNOSIS — D509 Iron deficiency anemia, unspecified: Secondary | ICD-10-CM | POA: Diagnosis not present

## 2014-07-03 DIAGNOSIS — N2581 Secondary hyperparathyroidism of renal origin: Secondary | ICD-10-CM | POA: Diagnosis not present

## 2014-07-03 DIAGNOSIS — Z992 Dependence on renal dialysis: Secondary | ICD-10-CM | POA: Diagnosis not present

## 2014-07-03 DIAGNOSIS — F0390 Unspecified dementia without behavioral disturbance: Secondary | ICD-10-CM | POA: Diagnosis not present

## 2014-07-03 DIAGNOSIS — D631 Anemia in chronic kidney disease: Secondary | ICD-10-CM | POA: Diagnosis not present

## 2014-07-03 DIAGNOSIS — N186 End stage renal disease: Secondary | ICD-10-CM | POA: Diagnosis not present

## 2014-07-04 DIAGNOSIS — F0390 Unspecified dementia without behavioral disturbance: Secondary | ICD-10-CM | POA: Diagnosis not present

## 2014-07-05 DIAGNOSIS — F0390 Unspecified dementia without behavioral disturbance: Secondary | ICD-10-CM | POA: Diagnosis not present

## 2014-07-05 DIAGNOSIS — Z992 Dependence on renal dialysis: Secondary | ICD-10-CM | POA: Diagnosis not present

## 2014-07-05 DIAGNOSIS — D631 Anemia in chronic kidney disease: Secondary | ICD-10-CM | POA: Diagnosis not present

## 2014-07-05 DIAGNOSIS — N186 End stage renal disease: Secondary | ICD-10-CM | POA: Diagnosis not present

## 2014-07-05 DIAGNOSIS — D509 Iron deficiency anemia, unspecified: Secondary | ICD-10-CM | POA: Diagnosis not present

## 2014-07-05 DIAGNOSIS — N2581 Secondary hyperparathyroidism of renal origin: Secondary | ICD-10-CM | POA: Diagnosis not present

## 2014-07-06 DIAGNOSIS — F0390 Unspecified dementia without behavioral disturbance: Secondary | ICD-10-CM | POA: Diagnosis not present

## 2014-07-07 DIAGNOSIS — F0390 Unspecified dementia without behavioral disturbance: Secondary | ICD-10-CM | POA: Diagnosis not present

## 2014-07-08 DIAGNOSIS — N2581 Secondary hyperparathyroidism of renal origin: Secondary | ICD-10-CM | POA: Diagnosis not present

## 2014-07-08 DIAGNOSIS — Z992 Dependence on renal dialysis: Secondary | ICD-10-CM | POA: Diagnosis not present

## 2014-07-08 DIAGNOSIS — D509 Iron deficiency anemia, unspecified: Secondary | ICD-10-CM | POA: Diagnosis not present

## 2014-07-08 DIAGNOSIS — N186 End stage renal disease: Secondary | ICD-10-CM | POA: Diagnosis not present

## 2014-07-08 DIAGNOSIS — F0390 Unspecified dementia without behavioral disturbance: Secondary | ICD-10-CM | POA: Diagnosis not present

## 2014-07-08 DIAGNOSIS — D631 Anemia in chronic kidney disease: Secondary | ICD-10-CM | POA: Diagnosis not present

## 2014-07-09 DIAGNOSIS — F0390 Unspecified dementia without behavioral disturbance: Secondary | ICD-10-CM | POA: Diagnosis not present

## 2014-07-10 ENCOUNTER — Other Ambulatory Visit: Payer: Self-pay | Admitting: *Deleted

## 2014-07-10 DIAGNOSIS — L84 Corns and callosities: Secondary | ICD-10-CM | POA: Diagnosis not present

## 2014-07-10 DIAGNOSIS — Z992 Dependence on renal dialysis: Secondary | ICD-10-CM | POA: Diagnosis not present

## 2014-07-10 DIAGNOSIS — D509 Iron deficiency anemia, unspecified: Secondary | ICD-10-CM | POA: Diagnosis not present

## 2014-07-10 DIAGNOSIS — E1051 Type 1 diabetes mellitus with diabetic peripheral angiopathy without gangrene: Secondary | ICD-10-CM | POA: Diagnosis not present

## 2014-07-10 DIAGNOSIS — F0390 Unspecified dementia without behavioral disturbance: Secondary | ICD-10-CM | POA: Diagnosis not present

## 2014-07-10 DIAGNOSIS — N186 End stage renal disease: Secondary | ICD-10-CM | POA: Diagnosis not present

## 2014-07-10 DIAGNOSIS — D631 Anemia in chronic kidney disease: Secondary | ICD-10-CM | POA: Diagnosis not present

## 2014-07-10 DIAGNOSIS — N2581 Secondary hyperparathyroidism of renal origin: Secondary | ICD-10-CM | POA: Diagnosis not present

## 2014-07-10 MED ORDER — MIRTAZAPINE 15 MG PO TABS: 7.5000 mg | ORAL_TABLET | Freq: Every day | ORAL | Status: DC

## 2014-07-10 MED ORDER — RANITIDINE HCL 150 MG PO TABS: 150.0000 mg | ORAL_TABLET | Freq: Every day | ORAL | Status: DC

## 2014-07-10 NOTE — Telephone Encounter (Signed)
Received call from pharmacy requesting refill on Remeron and Zantac.   Refill appropriate and filled per protocol.

## 2014-07-11 DIAGNOSIS — Z992 Dependence on renal dialysis: Secondary | ICD-10-CM | POA: Diagnosis not present

## 2014-07-11 DIAGNOSIS — N186 End stage renal disease: Secondary | ICD-10-CM | POA: Diagnosis not present

## 2014-07-11 DIAGNOSIS — F0390 Unspecified dementia without behavioral disturbance: Secondary | ICD-10-CM | POA: Diagnosis not present

## 2014-07-12 DIAGNOSIS — N186 End stage renal disease: Secondary | ICD-10-CM | POA: Diagnosis not present

## 2014-07-12 DIAGNOSIS — N2581 Secondary hyperparathyroidism of renal origin: Secondary | ICD-10-CM | POA: Diagnosis not present

## 2014-07-12 DIAGNOSIS — D631 Anemia in chronic kidney disease: Secondary | ICD-10-CM | POA: Diagnosis not present

## 2014-07-12 DIAGNOSIS — D509 Iron deficiency anemia, unspecified: Secondary | ICD-10-CM | POA: Diagnosis not present

## 2014-07-12 DIAGNOSIS — R32 Unspecified urinary incontinence: Secondary | ICD-10-CM | POA: Diagnosis not present

## 2014-07-12 DIAGNOSIS — F0390 Unspecified dementia without behavioral disturbance: Secondary | ICD-10-CM | POA: Diagnosis not present

## 2014-07-12 DIAGNOSIS — Z992 Dependence on renal dialysis: Secondary | ICD-10-CM | POA: Diagnosis not present

## 2014-07-12 DIAGNOSIS — R159 Full incontinence of feces: Secondary | ICD-10-CM | POA: Diagnosis not present

## 2014-07-13 DIAGNOSIS — F0390 Unspecified dementia without behavioral disturbance: Secondary | ICD-10-CM | POA: Diagnosis not present

## 2014-07-14 DIAGNOSIS — F0390 Unspecified dementia without behavioral disturbance: Secondary | ICD-10-CM | POA: Diagnosis not present

## 2014-07-15 DIAGNOSIS — Z992 Dependence on renal dialysis: Secondary | ICD-10-CM | POA: Diagnosis not present

## 2014-07-15 DIAGNOSIS — D631 Anemia in chronic kidney disease: Secondary | ICD-10-CM | POA: Diagnosis not present

## 2014-07-15 DIAGNOSIS — N186 End stage renal disease: Secondary | ICD-10-CM | POA: Diagnosis not present

## 2014-07-15 DIAGNOSIS — F0390 Unspecified dementia without behavioral disturbance: Secondary | ICD-10-CM | POA: Diagnosis not present

## 2014-07-15 DIAGNOSIS — D509 Iron deficiency anemia, unspecified: Secondary | ICD-10-CM | POA: Diagnosis not present

## 2014-07-15 DIAGNOSIS — N2581 Secondary hyperparathyroidism of renal origin: Secondary | ICD-10-CM | POA: Diagnosis not present

## 2014-07-16 DIAGNOSIS — F0390 Unspecified dementia without behavioral disturbance: Secondary | ICD-10-CM | POA: Diagnosis not present

## 2014-07-17 DIAGNOSIS — N2581 Secondary hyperparathyroidism of renal origin: Secondary | ICD-10-CM | POA: Diagnosis not present

## 2014-07-17 DIAGNOSIS — Z992 Dependence on renal dialysis: Secondary | ICD-10-CM | POA: Diagnosis not present

## 2014-07-17 DIAGNOSIS — D509 Iron deficiency anemia, unspecified: Secondary | ICD-10-CM | POA: Diagnosis not present

## 2014-07-17 DIAGNOSIS — N186 End stage renal disease: Secondary | ICD-10-CM | POA: Diagnosis not present

## 2014-07-17 DIAGNOSIS — F0390 Unspecified dementia without behavioral disturbance: Secondary | ICD-10-CM | POA: Diagnosis not present

## 2014-07-17 DIAGNOSIS — D631 Anemia in chronic kidney disease: Secondary | ICD-10-CM | POA: Diagnosis not present

## 2014-07-18 DIAGNOSIS — F0390 Unspecified dementia without behavioral disturbance: Secondary | ICD-10-CM | POA: Diagnosis not present

## 2014-07-19 DIAGNOSIS — F0390 Unspecified dementia without behavioral disturbance: Secondary | ICD-10-CM | POA: Diagnosis not present

## 2014-07-19 DIAGNOSIS — N186 End stage renal disease: Secondary | ICD-10-CM | POA: Diagnosis not present

## 2014-07-19 DIAGNOSIS — N2581 Secondary hyperparathyroidism of renal origin: Secondary | ICD-10-CM | POA: Diagnosis not present

## 2014-07-19 DIAGNOSIS — D509 Iron deficiency anemia, unspecified: Secondary | ICD-10-CM | POA: Diagnosis not present

## 2014-07-19 DIAGNOSIS — D631 Anemia in chronic kidney disease: Secondary | ICD-10-CM | POA: Diagnosis not present

## 2014-07-19 DIAGNOSIS — Z992 Dependence on renal dialysis: Secondary | ICD-10-CM | POA: Diagnosis not present

## 2014-07-20 DIAGNOSIS — F0390 Unspecified dementia without behavioral disturbance: Secondary | ICD-10-CM | POA: Diagnosis not present

## 2014-07-21 DIAGNOSIS — F0390 Unspecified dementia without behavioral disturbance: Secondary | ICD-10-CM | POA: Diagnosis not present

## 2014-07-22 ENCOUNTER — Encounter: Payer: Self-pay | Admitting: Family Medicine

## 2014-07-22 DIAGNOSIS — D631 Anemia in chronic kidney disease: Secondary | ICD-10-CM | POA: Diagnosis not present

## 2014-07-22 DIAGNOSIS — Z992 Dependence on renal dialysis: Secondary | ICD-10-CM | POA: Diagnosis not present

## 2014-07-22 DIAGNOSIS — F0390 Unspecified dementia without behavioral disturbance: Secondary | ICD-10-CM | POA: Diagnosis not present

## 2014-07-22 DIAGNOSIS — D509 Iron deficiency anemia, unspecified: Secondary | ICD-10-CM | POA: Diagnosis not present

## 2014-07-22 DIAGNOSIS — N2581 Secondary hyperparathyroidism of renal origin: Secondary | ICD-10-CM | POA: Diagnosis not present

## 2014-07-22 DIAGNOSIS — N186 End stage renal disease: Secondary | ICD-10-CM | POA: Diagnosis not present

## 2014-07-23 DIAGNOSIS — F0390 Unspecified dementia without behavioral disturbance: Secondary | ICD-10-CM | POA: Diagnosis not present

## 2014-07-24 DIAGNOSIS — D631 Anemia in chronic kidney disease: Secondary | ICD-10-CM | POA: Diagnosis not present

## 2014-07-24 DIAGNOSIS — N2581 Secondary hyperparathyroidism of renal origin: Secondary | ICD-10-CM | POA: Diagnosis not present

## 2014-07-24 DIAGNOSIS — N186 End stage renal disease: Secondary | ICD-10-CM | POA: Diagnosis not present

## 2014-07-24 DIAGNOSIS — Z992 Dependence on renal dialysis: Secondary | ICD-10-CM | POA: Diagnosis not present

## 2014-07-24 DIAGNOSIS — D509 Iron deficiency anemia, unspecified: Secondary | ICD-10-CM | POA: Diagnosis not present

## 2014-07-24 DIAGNOSIS — F0390 Unspecified dementia without behavioral disturbance: Secondary | ICD-10-CM | POA: Diagnosis not present

## 2014-07-25 DIAGNOSIS — F0390 Unspecified dementia without behavioral disturbance: Secondary | ICD-10-CM | POA: Diagnosis not present

## 2014-07-26 DIAGNOSIS — F0390 Unspecified dementia without behavioral disturbance: Secondary | ICD-10-CM | POA: Diagnosis not present

## 2014-07-26 DIAGNOSIS — D631 Anemia in chronic kidney disease: Secondary | ICD-10-CM | POA: Diagnosis not present

## 2014-07-26 DIAGNOSIS — N2581 Secondary hyperparathyroidism of renal origin: Secondary | ICD-10-CM | POA: Diagnosis not present

## 2014-07-26 DIAGNOSIS — N186 End stage renal disease: Secondary | ICD-10-CM | POA: Diagnosis not present

## 2014-07-26 DIAGNOSIS — Z992 Dependence on renal dialysis: Secondary | ICD-10-CM | POA: Diagnosis not present

## 2014-07-26 DIAGNOSIS — D509 Iron deficiency anemia, unspecified: Secondary | ICD-10-CM | POA: Diagnosis not present

## 2014-07-27 DIAGNOSIS — F0390 Unspecified dementia without behavioral disturbance: Secondary | ICD-10-CM | POA: Diagnosis not present

## 2014-07-28 DIAGNOSIS — F0390 Unspecified dementia without behavioral disturbance: Secondary | ICD-10-CM | POA: Diagnosis not present

## 2014-07-29 DIAGNOSIS — N186 End stage renal disease: Secondary | ICD-10-CM | POA: Diagnosis not present

## 2014-07-29 DIAGNOSIS — N2581 Secondary hyperparathyroidism of renal origin: Secondary | ICD-10-CM | POA: Diagnosis not present

## 2014-07-29 DIAGNOSIS — D509 Iron deficiency anemia, unspecified: Secondary | ICD-10-CM | POA: Diagnosis not present

## 2014-07-29 DIAGNOSIS — D631 Anemia in chronic kidney disease: Secondary | ICD-10-CM | POA: Diagnosis not present

## 2014-07-29 DIAGNOSIS — Z992 Dependence on renal dialysis: Secondary | ICD-10-CM | POA: Diagnosis not present

## 2014-07-29 DIAGNOSIS — F0390 Unspecified dementia without behavioral disturbance: Secondary | ICD-10-CM | POA: Diagnosis not present

## 2014-07-30 DIAGNOSIS — H43813 Vitreous degeneration, bilateral: Secondary | ICD-10-CM | POA: Diagnosis not present

## 2014-07-30 DIAGNOSIS — F0390 Unspecified dementia without behavioral disturbance: Secondary | ICD-10-CM | POA: Diagnosis not present

## 2014-07-30 DIAGNOSIS — H34812 Central retinal vein occlusion, left eye: Secondary | ICD-10-CM | POA: Diagnosis not present

## 2014-07-30 DIAGNOSIS — E11321 Type 2 diabetes mellitus with mild nonproliferative diabetic retinopathy with macular edema: Secondary | ICD-10-CM | POA: Diagnosis not present

## 2014-07-30 DIAGNOSIS — H3581 Retinal edema: Secondary | ICD-10-CM | POA: Diagnosis not present

## 2014-07-31 DIAGNOSIS — N186 End stage renal disease: Secondary | ICD-10-CM | POA: Diagnosis not present

## 2014-07-31 DIAGNOSIS — D631 Anemia in chronic kidney disease: Secondary | ICD-10-CM | POA: Diagnosis not present

## 2014-07-31 DIAGNOSIS — D509 Iron deficiency anemia, unspecified: Secondary | ICD-10-CM | POA: Diagnosis not present

## 2014-07-31 DIAGNOSIS — Z992 Dependence on renal dialysis: Secondary | ICD-10-CM | POA: Diagnosis not present

## 2014-07-31 DIAGNOSIS — N2581 Secondary hyperparathyroidism of renal origin: Secondary | ICD-10-CM | POA: Diagnosis not present

## 2014-07-31 DIAGNOSIS — F0390 Unspecified dementia without behavioral disturbance: Secondary | ICD-10-CM | POA: Diagnosis not present

## 2014-08-01 DIAGNOSIS — F0390 Unspecified dementia without behavioral disturbance: Secondary | ICD-10-CM | POA: Diagnosis not present

## 2014-08-02 DIAGNOSIS — D509 Iron deficiency anemia, unspecified: Secondary | ICD-10-CM | POA: Diagnosis not present

## 2014-08-02 DIAGNOSIS — N2581 Secondary hyperparathyroidism of renal origin: Secondary | ICD-10-CM | POA: Diagnosis not present

## 2014-08-02 DIAGNOSIS — N186 End stage renal disease: Secondary | ICD-10-CM | POA: Diagnosis not present

## 2014-08-02 DIAGNOSIS — D631 Anemia in chronic kidney disease: Secondary | ICD-10-CM | POA: Diagnosis not present

## 2014-08-02 DIAGNOSIS — F0390 Unspecified dementia without behavioral disturbance: Secondary | ICD-10-CM | POA: Diagnosis not present

## 2014-08-02 DIAGNOSIS — Z992 Dependence on renal dialysis: Secondary | ICD-10-CM | POA: Diagnosis not present

## 2014-08-03 DIAGNOSIS — F0390 Unspecified dementia without behavioral disturbance: Secondary | ICD-10-CM | POA: Diagnosis not present

## 2014-08-04 DIAGNOSIS — F0390 Unspecified dementia without behavioral disturbance: Secondary | ICD-10-CM | POA: Diagnosis not present

## 2014-08-05 DIAGNOSIS — N186 End stage renal disease: Secondary | ICD-10-CM | POA: Diagnosis not present

## 2014-08-05 DIAGNOSIS — Z992 Dependence on renal dialysis: Secondary | ICD-10-CM | POA: Diagnosis not present

## 2014-08-05 DIAGNOSIS — N2581 Secondary hyperparathyroidism of renal origin: Secondary | ICD-10-CM | POA: Diagnosis not present

## 2014-08-05 DIAGNOSIS — F0390 Unspecified dementia without behavioral disturbance: Secondary | ICD-10-CM | POA: Diagnosis not present

## 2014-08-05 DIAGNOSIS — D631 Anemia in chronic kidney disease: Secondary | ICD-10-CM | POA: Diagnosis not present

## 2014-08-05 DIAGNOSIS — D509 Iron deficiency anemia, unspecified: Secondary | ICD-10-CM | POA: Diagnosis not present

## 2014-08-06 DIAGNOSIS — F0390 Unspecified dementia without behavioral disturbance: Secondary | ICD-10-CM | POA: Diagnosis not present

## 2014-08-07 DIAGNOSIS — N2581 Secondary hyperparathyroidism of renal origin: Secondary | ICD-10-CM | POA: Diagnosis not present

## 2014-08-07 DIAGNOSIS — N186 End stage renal disease: Secondary | ICD-10-CM | POA: Diagnosis not present

## 2014-08-07 DIAGNOSIS — D509 Iron deficiency anemia, unspecified: Secondary | ICD-10-CM | POA: Diagnosis not present

## 2014-08-07 DIAGNOSIS — Z992 Dependence on renal dialysis: Secondary | ICD-10-CM | POA: Diagnosis not present

## 2014-08-07 DIAGNOSIS — F0390 Unspecified dementia without behavioral disturbance: Secondary | ICD-10-CM | POA: Diagnosis not present

## 2014-08-07 DIAGNOSIS — D631 Anemia in chronic kidney disease: Secondary | ICD-10-CM | POA: Diagnosis not present

## 2014-08-08 DIAGNOSIS — F0390 Unspecified dementia without behavioral disturbance: Secondary | ICD-10-CM | POA: Diagnosis not present

## 2014-08-09 DIAGNOSIS — N186 End stage renal disease: Secondary | ICD-10-CM | POA: Diagnosis not present

## 2014-08-09 DIAGNOSIS — D631 Anemia in chronic kidney disease: Secondary | ICD-10-CM | POA: Diagnosis not present

## 2014-08-09 DIAGNOSIS — N2581 Secondary hyperparathyroidism of renal origin: Secondary | ICD-10-CM | POA: Diagnosis not present

## 2014-08-09 DIAGNOSIS — Z992 Dependence on renal dialysis: Secondary | ICD-10-CM | POA: Diagnosis not present

## 2014-08-09 DIAGNOSIS — F0390 Unspecified dementia without behavioral disturbance: Secondary | ICD-10-CM | POA: Diagnosis not present

## 2014-08-09 DIAGNOSIS — D509 Iron deficiency anemia, unspecified: Secondary | ICD-10-CM | POA: Diagnosis not present

## 2014-08-10 DIAGNOSIS — F0390 Unspecified dementia without behavioral disturbance: Secondary | ICD-10-CM | POA: Diagnosis not present

## 2014-08-11 DIAGNOSIS — F0390 Unspecified dementia without behavioral disturbance: Secondary | ICD-10-CM | POA: Diagnosis not present

## 2014-08-11 DIAGNOSIS — N186 End stage renal disease: Secondary | ICD-10-CM | POA: Diagnosis not present

## 2014-08-11 DIAGNOSIS — Z992 Dependence on renal dialysis: Secondary | ICD-10-CM | POA: Diagnosis not present

## 2014-08-12 DIAGNOSIS — N2581 Secondary hyperparathyroidism of renal origin: Secondary | ICD-10-CM | POA: Diagnosis not present

## 2014-08-12 DIAGNOSIS — Z992 Dependence on renal dialysis: Secondary | ICD-10-CM | POA: Diagnosis not present

## 2014-08-12 DIAGNOSIS — R159 Full incontinence of feces: Secondary | ICD-10-CM | POA: Diagnosis not present

## 2014-08-12 DIAGNOSIS — R32 Unspecified urinary incontinence: Secondary | ICD-10-CM | POA: Diagnosis not present

## 2014-08-12 DIAGNOSIS — F0390 Unspecified dementia without behavioral disturbance: Secondary | ICD-10-CM | POA: Diagnosis not present

## 2014-08-12 DIAGNOSIS — D509 Iron deficiency anemia, unspecified: Secondary | ICD-10-CM | POA: Diagnosis not present

## 2014-08-12 DIAGNOSIS — D631 Anemia in chronic kidney disease: Secondary | ICD-10-CM | POA: Diagnosis not present

## 2014-08-12 DIAGNOSIS — N186 End stage renal disease: Secondary | ICD-10-CM | POA: Diagnosis not present

## 2014-08-13 DIAGNOSIS — F0390 Unspecified dementia without behavioral disturbance: Secondary | ICD-10-CM | POA: Diagnosis not present

## 2014-08-13 DIAGNOSIS — E11321 Type 2 diabetes mellitus with mild nonproliferative diabetic retinopathy with macular edema: Secondary | ICD-10-CM | POA: Diagnosis not present

## 2014-08-14 DIAGNOSIS — N186 End stage renal disease: Secondary | ICD-10-CM | POA: Diagnosis not present

## 2014-08-14 DIAGNOSIS — D509 Iron deficiency anemia, unspecified: Secondary | ICD-10-CM | POA: Diagnosis not present

## 2014-08-14 DIAGNOSIS — N2581 Secondary hyperparathyroidism of renal origin: Secondary | ICD-10-CM | POA: Diagnosis not present

## 2014-08-14 DIAGNOSIS — F0390 Unspecified dementia without behavioral disturbance: Secondary | ICD-10-CM | POA: Diagnosis not present

## 2014-08-14 DIAGNOSIS — Z992 Dependence on renal dialysis: Secondary | ICD-10-CM | POA: Diagnosis not present

## 2014-08-14 DIAGNOSIS — D631 Anemia in chronic kidney disease: Secondary | ICD-10-CM | POA: Diagnosis not present

## 2014-08-15 DIAGNOSIS — F0390 Unspecified dementia without behavioral disturbance: Secondary | ICD-10-CM | POA: Diagnosis not present

## 2014-08-16 DIAGNOSIS — D631 Anemia in chronic kidney disease: Secondary | ICD-10-CM | POA: Diagnosis not present

## 2014-08-16 DIAGNOSIS — N186 End stage renal disease: Secondary | ICD-10-CM | POA: Diagnosis not present

## 2014-08-16 DIAGNOSIS — N2581 Secondary hyperparathyroidism of renal origin: Secondary | ICD-10-CM | POA: Diagnosis not present

## 2014-08-16 DIAGNOSIS — F0390 Unspecified dementia without behavioral disturbance: Secondary | ICD-10-CM | POA: Diagnosis not present

## 2014-08-16 DIAGNOSIS — D509 Iron deficiency anemia, unspecified: Secondary | ICD-10-CM | POA: Diagnosis not present

## 2014-08-16 DIAGNOSIS — Z992 Dependence on renal dialysis: Secondary | ICD-10-CM | POA: Diagnosis not present

## 2014-08-17 DIAGNOSIS — F0390 Unspecified dementia without behavioral disturbance: Secondary | ICD-10-CM | POA: Diagnosis not present

## 2014-08-18 DIAGNOSIS — F0390 Unspecified dementia without behavioral disturbance: Secondary | ICD-10-CM | POA: Diagnosis not present

## 2014-08-19 DIAGNOSIS — Z992 Dependence on renal dialysis: Secondary | ICD-10-CM | POA: Diagnosis not present

## 2014-08-19 DIAGNOSIS — N186 End stage renal disease: Secondary | ICD-10-CM | POA: Diagnosis not present

## 2014-08-19 DIAGNOSIS — D631 Anemia in chronic kidney disease: Secondary | ICD-10-CM | POA: Diagnosis not present

## 2014-08-19 DIAGNOSIS — D509 Iron deficiency anemia, unspecified: Secondary | ICD-10-CM | POA: Diagnosis not present

## 2014-08-19 DIAGNOSIS — N2581 Secondary hyperparathyroidism of renal origin: Secondary | ICD-10-CM | POA: Diagnosis not present

## 2014-08-19 DIAGNOSIS — F0390 Unspecified dementia without behavioral disturbance: Secondary | ICD-10-CM | POA: Diagnosis not present

## 2014-08-20 DIAGNOSIS — F0390 Unspecified dementia without behavioral disturbance: Secondary | ICD-10-CM | POA: Diagnosis not present

## 2014-08-21 DIAGNOSIS — N186 End stage renal disease: Secondary | ICD-10-CM | POA: Diagnosis not present

## 2014-08-21 DIAGNOSIS — Z992 Dependence on renal dialysis: Secondary | ICD-10-CM | POA: Diagnosis not present

## 2014-08-21 DIAGNOSIS — D509 Iron deficiency anemia, unspecified: Secondary | ICD-10-CM | POA: Diagnosis not present

## 2014-08-21 DIAGNOSIS — F0390 Unspecified dementia without behavioral disturbance: Secondary | ICD-10-CM | POA: Diagnosis not present

## 2014-08-21 DIAGNOSIS — N2581 Secondary hyperparathyroidism of renal origin: Secondary | ICD-10-CM | POA: Diagnosis not present

## 2014-08-21 DIAGNOSIS — D631 Anemia in chronic kidney disease: Secondary | ICD-10-CM | POA: Diagnosis not present

## 2014-08-22 DIAGNOSIS — F0390 Unspecified dementia without behavioral disturbance: Secondary | ICD-10-CM | POA: Diagnosis not present

## 2014-08-23 DIAGNOSIS — N186 End stage renal disease: Secondary | ICD-10-CM | POA: Diagnosis not present

## 2014-08-23 DIAGNOSIS — Z992 Dependence on renal dialysis: Secondary | ICD-10-CM | POA: Diagnosis not present

## 2014-08-23 DIAGNOSIS — F0390 Unspecified dementia without behavioral disturbance: Secondary | ICD-10-CM | POA: Diagnosis not present

## 2014-08-23 DIAGNOSIS — N2581 Secondary hyperparathyroidism of renal origin: Secondary | ICD-10-CM | POA: Diagnosis not present

## 2014-08-23 DIAGNOSIS — D509 Iron deficiency anemia, unspecified: Secondary | ICD-10-CM | POA: Diagnosis not present

## 2014-08-23 DIAGNOSIS — D631 Anemia in chronic kidney disease: Secondary | ICD-10-CM | POA: Diagnosis not present

## 2014-08-24 DIAGNOSIS — F0390 Unspecified dementia without behavioral disturbance: Secondary | ICD-10-CM | POA: Diagnosis not present

## 2014-08-25 DIAGNOSIS — F0390 Unspecified dementia without behavioral disturbance: Secondary | ICD-10-CM | POA: Diagnosis not present

## 2014-08-26 DIAGNOSIS — N2581 Secondary hyperparathyroidism of renal origin: Secondary | ICD-10-CM | POA: Diagnosis not present

## 2014-08-26 DIAGNOSIS — D509 Iron deficiency anemia, unspecified: Secondary | ICD-10-CM | POA: Diagnosis not present

## 2014-08-26 DIAGNOSIS — Z992 Dependence on renal dialysis: Secondary | ICD-10-CM | POA: Diagnosis not present

## 2014-08-26 DIAGNOSIS — N186 End stage renal disease: Secondary | ICD-10-CM | POA: Diagnosis not present

## 2014-08-26 DIAGNOSIS — D631 Anemia in chronic kidney disease: Secondary | ICD-10-CM | POA: Diagnosis not present

## 2014-08-26 DIAGNOSIS — F0390 Unspecified dementia without behavioral disturbance: Secondary | ICD-10-CM | POA: Diagnosis not present

## 2014-08-27 DIAGNOSIS — F0390 Unspecified dementia without behavioral disturbance: Secondary | ICD-10-CM | POA: Diagnosis not present

## 2014-08-28 DIAGNOSIS — D509 Iron deficiency anemia, unspecified: Secondary | ICD-10-CM | POA: Diagnosis not present

## 2014-08-28 DIAGNOSIS — Z992 Dependence on renal dialysis: Secondary | ICD-10-CM | POA: Diagnosis not present

## 2014-08-28 DIAGNOSIS — D631 Anemia in chronic kidney disease: Secondary | ICD-10-CM | POA: Diagnosis not present

## 2014-08-28 DIAGNOSIS — F0390 Unspecified dementia without behavioral disturbance: Secondary | ICD-10-CM | POA: Diagnosis not present

## 2014-08-28 DIAGNOSIS — N186 End stage renal disease: Secondary | ICD-10-CM | POA: Diagnosis not present

## 2014-08-28 DIAGNOSIS — N2581 Secondary hyperparathyroidism of renal origin: Secondary | ICD-10-CM | POA: Diagnosis not present

## 2014-08-29 DIAGNOSIS — F0390 Unspecified dementia without behavioral disturbance: Secondary | ICD-10-CM | POA: Diagnosis not present

## 2014-08-30 DIAGNOSIS — Z992 Dependence on renal dialysis: Secondary | ICD-10-CM | POA: Diagnosis not present

## 2014-08-30 DIAGNOSIS — N2581 Secondary hyperparathyroidism of renal origin: Secondary | ICD-10-CM | POA: Diagnosis not present

## 2014-08-30 DIAGNOSIS — D631 Anemia in chronic kidney disease: Secondary | ICD-10-CM | POA: Diagnosis not present

## 2014-08-30 DIAGNOSIS — D509 Iron deficiency anemia, unspecified: Secondary | ICD-10-CM | POA: Diagnosis not present

## 2014-08-30 DIAGNOSIS — N186 End stage renal disease: Secondary | ICD-10-CM | POA: Diagnosis not present

## 2014-08-30 DIAGNOSIS — F0390 Unspecified dementia without behavioral disturbance: Secondary | ICD-10-CM | POA: Diagnosis not present

## 2014-08-31 DIAGNOSIS — F0390 Unspecified dementia without behavioral disturbance: Secondary | ICD-10-CM | POA: Diagnosis not present

## 2014-09-01 DIAGNOSIS — F0390 Unspecified dementia without behavioral disturbance: Secondary | ICD-10-CM | POA: Diagnosis not present

## 2014-09-02 DIAGNOSIS — D509 Iron deficiency anemia, unspecified: Secondary | ICD-10-CM | POA: Diagnosis not present

## 2014-09-02 DIAGNOSIS — N2581 Secondary hyperparathyroidism of renal origin: Secondary | ICD-10-CM | POA: Diagnosis not present

## 2014-09-02 DIAGNOSIS — N186 End stage renal disease: Secondary | ICD-10-CM | POA: Diagnosis not present

## 2014-09-02 DIAGNOSIS — Z992 Dependence on renal dialysis: Secondary | ICD-10-CM | POA: Diagnosis not present

## 2014-09-02 DIAGNOSIS — F0390 Unspecified dementia without behavioral disturbance: Secondary | ICD-10-CM | POA: Diagnosis not present

## 2014-09-02 DIAGNOSIS — D631 Anemia in chronic kidney disease: Secondary | ICD-10-CM | POA: Diagnosis not present

## 2014-09-03 DIAGNOSIS — F0390 Unspecified dementia without behavioral disturbance: Secondary | ICD-10-CM | POA: Diagnosis not present

## 2014-09-04 DIAGNOSIS — F0390 Unspecified dementia without behavioral disturbance: Secondary | ICD-10-CM | POA: Diagnosis not present

## 2014-09-04 DIAGNOSIS — D631 Anemia in chronic kidney disease: Secondary | ICD-10-CM | POA: Diagnosis not present

## 2014-09-04 DIAGNOSIS — Z992 Dependence on renal dialysis: Secondary | ICD-10-CM | POA: Diagnosis not present

## 2014-09-04 DIAGNOSIS — N186 End stage renal disease: Secondary | ICD-10-CM | POA: Diagnosis not present

## 2014-09-04 DIAGNOSIS — N2581 Secondary hyperparathyroidism of renal origin: Secondary | ICD-10-CM | POA: Diagnosis not present

## 2014-09-04 DIAGNOSIS — D509 Iron deficiency anemia, unspecified: Secondary | ICD-10-CM | POA: Diagnosis not present

## 2014-09-05 DIAGNOSIS — F0390 Unspecified dementia without behavioral disturbance: Secondary | ICD-10-CM | POA: Diagnosis not present

## 2014-09-06 DIAGNOSIS — D509 Iron deficiency anemia, unspecified: Secondary | ICD-10-CM | POA: Diagnosis not present

## 2014-09-06 DIAGNOSIS — D631 Anemia in chronic kidney disease: Secondary | ICD-10-CM | POA: Diagnosis not present

## 2014-09-06 DIAGNOSIS — N2581 Secondary hyperparathyroidism of renal origin: Secondary | ICD-10-CM | POA: Diagnosis not present

## 2014-09-06 DIAGNOSIS — Z992 Dependence on renal dialysis: Secondary | ICD-10-CM | POA: Diagnosis not present

## 2014-09-06 DIAGNOSIS — F0390 Unspecified dementia without behavioral disturbance: Secondary | ICD-10-CM | POA: Diagnosis not present

## 2014-09-06 DIAGNOSIS — N186 End stage renal disease: Secondary | ICD-10-CM | POA: Diagnosis not present

## 2014-09-07 DIAGNOSIS — F0390 Unspecified dementia without behavioral disturbance: Secondary | ICD-10-CM | POA: Diagnosis not present

## 2014-09-08 DIAGNOSIS — F0390 Unspecified dementia without behavioral disturbance: Secondary | ICD-10-CM | POA: Diagnosis not present

## 2014-09-09 DIAGNOSIS — Z992 Dependence on renal dialysis: Secondary | ICD-10-CM | POA: Diagnosis not present

## 2014-09-09 DIAGNOSIS — N2581 Secondary hyperparathyroidism of renal origin: Secondary | ICD-10-CM | POA: Diagnosis not present

## 2014-09-09 DIAGNOSIS — F0390 Unspecified dementia without behavioral disturbance: Secondary | ICD-10-CM | POA: Diagnosis not present

## 2014-09-09 DIAGNOSIS — D509 Iron deficiency anemia, unspecified: Secondary | ICD-10-CM | POA: Diagnosis not present

## 2014-09-09 DIAGNOSIS — N186 End stage renal disease: Secondary | ICD-10-CM | POA: Diagnosis not present

## 2014-09-09 DIAGNOSIS — D631 Anemia in chronic kidney disease: Secondary | ICD-10-CM | POA: Diagnosis not present

## 2014-09-10 DIAGNOSIS — F0390 Unspecified dementia without behavioral disturbance: Secondary | ICD-10-CM | POA: Diagnosis not present

## 2014-09-11 DIAGNOSIS — Z992 Dependence on renal dialysis: Secondary | ICD-10-CM | POA: Diagnosis not present

## 2014-09-11 DIAGNOSIS — F0390 Unspecified dementia without behavioral disturbance: Secondary | ICD-10-CM | POA: Diagnosis not present

## 2014-09-11 DIAGNOSIS — N186 End stage renal disease: Secondary | ICD-10-CM | POA: Diagnosis not present

## 2014-09-11 DIAGNOSIS — N2581 Secondary hyperparathyroidism of renal origin: Secondary | ICD-10-CM | POA: Diagnosis not present

## 2014-09-11 DIAGNOSIS — D509 Iron deficiency anemia, unspecified: Secondary | ICD-10-CM | POA: Diagnosis not present

## 2014-09-11 DIAGNOSIS — D631 Anemia in chronic kidney disease: Secondary | ICD-10-CM | POA: Diagnosis not present

## 2014-09-12 DIAGNOSIS — F0390 Unspecified dementia without behavioral disturbance: Secondary | ICD-10-CM | POA: Diagnosis not present

## 2014-09-12 DIAGNOSIS — R159 Full incontinence of feces: Secondary | ICD-10-CM | POA: Diagnosis not present

## 2014-09-12 DIAGNOSIS — R32 Unspecified urinary incontinence: Secondary | ICD-10-CM | POA: Diagnosis not present

## 2014-09-13 DIAGNOSIS — Z992 Dependence on renal dialysis: Secondary | ICD-10-CM | POA: Diagnosis not present

## 2014-09-13 DIAGNOSIS — D509 Iron deficiency anemia, unspecified: Secondary | ICD-10-CM | POA: Diagnosis not present

## 2014-09-13 DIAGNOSIS — D631 Anemia in chronic kidney disease: Secondary | ICD-10-CM | POA: Diagnosis not present

## 2014-09-13 DIAGNOSIS — Z23 Encounter for immunization: Secondary | ICD-10-CM | POA: Diagnosis not present

## 2014-09-13 DIAGNOSIS — N2581 Secondary hyperparathyroidism of renal origin: Secondary | ICD-10-CM | POA: Diagnosis not present

## 2014-09-13 DIAGNOSIS — N186 End stage renal disease: Secondary | ICD-10-CM | POA: Diagnosis not present

## 2014-09-13 DIAGNOSIS — F0390 Unspecified dementia without behavioral disturbance: Secondary | ICD-10-CM | POA: Diagnosis not present

## 2014-09-14 DIAGNOSIS — F0390 Unspecified dementia without behavioral disturbance: Secondary | ICD-10-CM | POA: Diagnosis not present

## 2014-09-15 DIAGNOSIS — F0390 Unspecified dementia without behavioral disturbance: Secondary | ICD-10-CM | POA: Diagnosis not present

## 2014-09-16 DIAGNOSIS — Z992 Dependence on renal dialysis: Secondary | ICD-10-CM | POA: Diagnosis not present

## 2014-09-16 DIAGNOSIS — N2581 Secondary hyperparathyroidism of renal origin: Secondary | ICD-10-CM | POA: Diagnosis not present

## 2014-09-16 DIAGNOSIS — D509 Iron deficiency anemia, unspecified: Secondary | ICD-10-CM | POA: Diagnosis not present

## 2014-09-16 DIAGNOSIS — N186 End stage renal disease: Secondary | ICD-10-CM | POA: Diagnosis not present

## 2014-09-16 DIAGNOSIS — Z23 Encounter for immunization: Secondary | ICD-10-CM | POA: Diagnosis not present

## 2014-09-16 DIAGNOSIS — D631 Anemia in chronic kidney disease: Secondary | ICD-10-CM | POA: Diagnosis not present

## 2014-09-16 DIAGNOSIS — F0390 Unspecified dementia without behavioral disturbance: Secondary | ICD-10-CM | POA: Diagnosis not present

## 2014-09-17 ENCOUNTER — Encounter: Payer: Self-pay | Admitting: *Deleted

## 2014-09-17 DIAGNOSIS — H3581 Retinal edema: Secondary | ICD-10-CM | POA: Diagnosis not present

## 2014-09-17 DIAGNOSIS — H43813 Vitreous degeneration, bilateral: Secondary | ICD-10-CM | POA: Diagnosis not present

## 2014-09-17 DIAGNOSIS — H34812 Central retinal vein occlusion, left eye: Secondary | ICD-10-CM | POA: Diagnosis not present

## 2014-09-17 DIAGNOSIS — E11321 Type 2 diabetes mellitus with mild nonproliferative diabetic retinopathy with macular edema: Secondary | ICD-10-CM | POA: Diagnosis not present

## 2014-09-17 DIAGNOSIS — F0390 Unspecified dementia without behavioral disturbance: Secondary | ICD-10-CM | POA: Diagnosis not present

## 2014-09-18 DIAGNOSIS — N2581 Secondary hyperparathyroidism of renal origin: Secondary | ICD-10-CM | POA: Diagnosis not present

## 2014-09-18 DIAGNOSIS — Z23 Encounter for immunization: Secondary | ICD-10-CM | POA: Diagnosis not present

## 2014-09-18 DIAGNOSIS — N186 End stage renal disease: Secondary | ICD-10-CM | POA: Diagnosis not present

## 2014-09-18 DIAGNOSIS — F0390 Unspecified dementia without behavioral disturbance: Secondary | ICD-10-CM | POA: Diagnosis not present

## 2014-09-18 DIAGNOSIS — Z992 Dependence on renal dialysis: Secondary | ICD-10-CM | POA: Diagnosis not present

## 2014-09-18 DIAGNOSIS — D509 Iron deficiency anemia, unspecified: Secondary | ICD-10-CM | POA: Diagnosis not present

## 2014-09-18 DIAGNOSIS — D631 Anemia in chronic kidney disease: Secondary | ICD-10-CM | POA: Diagnosis not present

## 2014-09-18 NOTE — Telephone Encounter (Signed)
This encounter was created in error - please disregard.

## 2014-09-19 DIAGNOSIS — F0390 Unspecified dementia without behavioral disturbance: Secondary | ICD-10-CM | POA: Diagnosis not present

## 2014-09-20 DIAGNOSIS — N186 End stage renal disease: Secondary | ICD-10-CM | POA: Diagnosis not present

## 2014-09-20 DIAGNOSIS — D631 Anemia in chronic kidney disease: Secondary | ICD-10-CM | POA: Diagnosis not present

## 2014-09-20 DIAGNOSIS — F0390 Unspecified dementia without behavioral disturbance: Secondary | ICD-10-CM | POA: Diagnosis not present

## 2014-09-20 DIAGNOSIS — N2581 Secondary hyperparathyroidism of renal origin: Secondary | ICD-10-CM | POA: Diagnosis not present

## 2014-09-20 DIAGNOSIS — D509 Iron deficiency anemia, unspecified: Secondary | ICD-10-CM | POA: Diagnosis not present

## 2014-09-20 DIAGNOSIS — Z23 Encounter for immunization: Secondary | ICD-10-CM | POA: Diagnosis not present

## 2014-09-20 DIAGNOSIS — Z992 Dependence on renal dialysis: Secondary | ICD-10-CM | POA: Diagnosis not present

## 2014-09-21 DIAGNOSIS — F0390 Unspecified dementia without behavioral disturbance: Secondary | ICD-10-CM | POA: Diagnosis not present

## 2014-09-22 DIAGNOSIS — F0390 Unspecified dementia without behavioral disturbance: Secondary | ICD-10-CM | POA: Diagnosis not present

## 2014-09-23 DIAGNOSIS — D509 Iron deficiency anemia, unspecified: Secondary | ICD-10-CM | POA: Diagnosis not present

## 2014-09-23 DIAGNOSIS — N2581 Secondary hyperparathyroidism of renal origin: Secondary | ICD-10-CM | POA: Diagnosis not present

## 2014-09-23 DIAGNOSIS — Z992 Dependence on renal dialysis: Secondary | ICD-10-CM | POA: Diagnosis not present

## 2014-09-23 DIAGNOSIS — N186 End stage renal disease: Secondary | ICD-10-CM | POA: Diagnosis not present

## 2014-09-23 DIAGNOSIS — Z23 Encounter for immunization: Secondary | ICD-10-CM | POA: Diagnosis not present

## 2014-09-23 DIAGNOSIS — D631 Anemia in chronic kidney disease: Secondary | ICD-10-CM | POA: Diagnosis not present

## 2014-09-23 DIAGNOSIS — F0390 Unspecified dementia without behavioral disturbance: Secondary | ICD-10-CM | POA: Diagnosis not present

## 2014-09-24 DIAGNOSIS — F0390 Unspecified dementia without behavioral disturbance: Secondary | ICD-10-CM | POA: Diagnosis not present

## 2014-09-25 DIAGNOSIS — Z23 Encounter for immunization: Secondary | ICD-10-CM | POA: Diagnosis not present

## 2014-09-25 DIAGNOSIS — F0390 Unspecified dementia without behavioral disturbance: Secondary | ICD-10-CM | POA: Diagnosis not present

## 2014-09-25 DIAGNOSIS — Z992 Dependence on renal dialysis: Secondary | ICD-10-CM | POA: Diagnosis not present

## 2014-09-25 DIAGNOSIS — N2581 Secondary hyperparathyroidism of renal origin: Secondary | ICD-10-CM | POA: Diagnosis not present

## 2014-09-25 DIAGNOSIS — N186 End stage renal disease: Secondary | ICD-10-CM | POA: Diagnosis not present

## 2014-09-25 DIAGNOSIS — D631 Anemia in chronic kidney disease: Secondary | ICD-10-CM | POA: Diagnosis not present

## 2014-09-25 DIAGNOSIS — D509 Iron deficiency anemia, unspecified: Secondary | ICD-10-CM | POA: Diagnosis not present

## 2014-09-26 DIAGNOSIS — F0390 Unspecified dementia without behavioral disturbance: Secondary | ICD-10-CM | POA: Diagnosis not present

## 2014-09-27 DIAGNOSIS — D631 Anemia in chronic kidney disease: Secondary | ICD-10-CM | POA: Diagnosis not present

## 2014-09-27 DIAGNOSIS — Z992 Dependence on renal dialysis: Secondary | ICD-10-CM | POA: Diagnosis not present

## 2014-09-27 DIAGNOSIS — D509 Iron deficiency anemia, unspecified: Secondary | ICD-10-CM | POA: Diagnosis not present

## 2014-09-27 DIAGNOSIS — F0390 Unspecified dementia without behavioral disturbance: Secondary | ICD-10-CM | POA: Diagnosis not present

## 2014-09-27 DIAGNOSIS — N186 End stage renal disease: Secondary | ICD-10-CM | POA: Diagnosis not present

## 2014-09-27 DIAGNOSIS — N2581 Secondary hyperparathyroidism of renal origin: Secondary | ICD-10-CM | POA: Diagnosis not present

## 2014-09-27 DIAGNOSIS — Z23 Encounter for immunization: Secondary | ICD-10-CM | POA: Diagnosis not present

## 2014-09-28 DIAGNOSIS — F0390 Unspecified dementia without behavioral disturbance: Secondary | ICD-10-CM | POA: Diagnosis not present

## 2014-09-29 DIAGNOSIS — F0390 Unspecified dementia without behavioral disturbance: Secondary | ICD-10-CM | POA: Diagnosis not present

## 2014-09-30 DIAGNOSIS — D509 Iron deficiency anemia, unspecified: Secondary | ICD-10-CM | POA: Diagnosis not present

## 2014-09-30 DIAGNOSIS — Z992 Dependence on renal dialysis: Secondary | ICD-10-CM | POA: Diagnosis not present

## 2014-09-30 DIAGNOSIS — N2581 Secondary hyperparathyroidism of renal origin: Secondary | ICD-10-CM | POA: Diagnosis not present

## 2014-09-30 DIAGNOSIS — Z23 Encounter for immunization: Secondary | ICD-10-CM | POA: Diagnosis not present

## 2014-09-30 DIAGNOSIS — N186 End stage renal disease: Secondary | ICD-10-CM | POA: Diagnosis not present

## 2014-09-30 DIAGNOSIS — F0390 Unspecified dementia without behavioral disturbance: Secondary | ICD-10-CM | POA: Diagnosis not present

## 2014-09-30 DIAGNOSIS — D631 Anemia in chronic kidney disease: Secondary | ICD-10-CM | POA: Diagnosis not present

## 2014-10-01 ENCOUNTER — Encounter: Payer: Self-pay | Admitting: Family Medicine

## 2014-10-01 DIAGNOSIS — J9811 Atelectasis: Secondary | ICD-10-CM | POA: Diagnosis not present

## 2014-10-01 DIAGNOSIS — R42 Dizziness and giddiness: Secondary | ICD-10-CM | POA: Diagnosis not present

## 2014-10-01 DIAGNOSIS — Z992 Dependence on renal dialysis: Secondary | ICD-10-CM | POA: Diagnosis not present

## 2014-10-01 DIAGNOSIS — N186 End stage renal disease: Secondary | ICD-10-CM | POA: Diagnosis not present

## 2014-10-01 DIAGNOSIS — I120 Hypertensive chronic kidney disease with stage 5 chronic kidney disease or end stage renal disease: Secondary | ICD-10-CM | POA: Diagnosis not present

## 2014-10-01 DIAGNOSIS — R030 Elevated blood-pressure reading, without diagnosis of hypertension: Secondary | ICD-10-CM | POA: Diagnosis not present

## 2014-10-01 DIAGNOSIS — I10 Essential (primary) hypertension: Secondary | ICD-10-CM | POA: Diagnosis not present

## 2014-10-01 DIAGNOSIS — F0390 Unspecified dementia without behavioral disturbance: Secondary | ICD-10-CM | POA: Diagnosis not present

## 2014-10-01 DIAGNOSIS — Z7982 Long term (current) use of aspirin: Secondary | ICD-10-CM | POA: Diagnosis not present

## 2014-10-01 DIAGNOSIS — Z79899 Other long term (current) drug therapy: Secondary | ICD-10-CM | POA: Diagnosis not present

## 2014-10-01 DIAGNOSIS — E119 Type 2 diabetes mellitus without complications: Secondary | ICD-10-CM | POA: Diagnosis not present

## 2014-10-02 DIAGNOSIS — Z23 Encounter for immunization: Secondary | ICD-10-CM | POA: Diagnosis not present

## 2014-10-02 DIAGNOSIS — D509 Iron deficiency anemia, unspecified: Secondary | ICD-10-CM | POA: Diagnosis not present

## 2014-10-02 DIAGNOSIS — J9811 Atelectasis: Secondary | ICD-10-CM | POA: Diagnosis not present

## 2014-10-02 DIAGNOSIS — I120 Hypertensive chronic kidney disease with stage 5 chronic kidney disease or end stage renal disease: Secondary | ICD-10-CM | POA: Diagnosis not present

## 2014-10-02 DIAGNOSIS — Z79899 Other long term (current) drug therapy: Secondary | ICD-10-CM | POA: Diagnosis not present

## 2014-10-02 DIAGNOSIS — Z743 Need for continuous supervision: Secondary | ICD-10-CM | POA: Diagnosis not present

## 2014-10-02 DIAGNOSIS — D631 Anemia in chronic kidney disease: Secondary | ICD-10-CM | POA: Diagnosis not present

## 2014-10-02 DIAGNOSIS — N2581 Secondary hyperparathyroidism of renal origin: Secondary | ICD-10-CM | POA: Diagnosis not present

## 2014-10-02 DIAGNOSIS — E119 Type 2 diabetes mellitus without complications: Secondary | ICD-10-CM | POA: Diagnosis not present

## 2014-10-02 DIAGNOSIS — R279 Unspecified lack of coordination: Secondary | ICD-10-CM | POA: Diagnosis not present

## 2014-10-02 DIAGNOSIS — Z7982 Long term (current) use of aspirin: Secondary | ICD-10-CM | POA: Diagnosis not present

## 2014-10-02 DIAGNOSIS — R42 Dizziness and giddiness: Secondary | ICD-10-CM | POA: Diagnosis not present

## 2014-10-02 DIAGNOSIS — Z992 Dependence on renal dialysis: Secondary | ICD-10-CM | POA: Diagnosis not present

## 2014-10-02 DIAGNOSIS — N186 End stage renal disease: Secondary | ICD-10-CM | POA: Diagnosis not present

## 2014-10-03 ENCOUNTER — Encounter: Payer: Self-pay | Admitting: Family Medicine

## 2014-10-03 DIAGNOSIS — F0390 Unspecified dementia without behavioral disturbance: Secondary | ICD-10-CM | POA: Diagnosis not present

## 2014-10-04 DIAGNOSIS — D509 Iron deficiency anemia, unspecified: Secondary | ICD-10-CM | POA: Diagnosis not present

## 2014-10-04 DIAGNOSIS — N186 End stage renal disease: Secondary | ICD-10-CM | POA: Diagnosis not present

## 2014-10-04 DIAGNOSIS — F0390 Unspecified dementia without behavioral disturbance: Secondary | ICD-10-CM | POA: Diagnosis not present

## 2014-10-04 DIAGNOSIS — N2581 Secondary hyperparathyroidism of renal origin: Secondary | ICD-10-CM | POA: Diagnosis not present

## 2014-10-04 DIAGNOSIS — Z992 Dependence on renal dialysis: Secondary | ICD-10-CM | POA: Diagnosis not present

## 2014-10-04 DIAGNOSIS — D631 Anemia in chronic kidney disease: Secondary | ICD-10-CM | POA: Diagnosis not present

## 2014-10-04 DIAGNOSIS — Z23 Encounter for immunization: Secondary | ICD-10-CM | POA: Diagnosis not present

## 2014-10-05 DIAGNOSIS — F0390 Unspecified dementia without behavioral disturbance: Secondary | ICD-10-CM | POA: Diagnosis not present

## 2014-10-06 DIAGNOSIS — F0390 Unspecified dementia without behavioral disturbance: Secondary | ICD-10-CM | POA: Diagnosis not present

## 2014-10-07 DIAGNOSIS — N2581 Secondary hyperparathyroidism of renal origin: Secondary | ICD-10-CM | POA: Diagnosis not present

## 2014-10-07 DIAGNOSIS — N186 End stage renal disease: Secondary | ICD-10-CM | POA: Diagnosis not present

## 2014-10-07 DIAGNOSIS — D509 Iron deficiency anemia, unspecified: Secondary | ICD-10-CM | POA: Diagnosis not present

## 2014-10-07 DIAGNOSIS — E1122 Type 2 diabetes mellitus with diabetic chronic kidney disease: Secondary | ICD-10-CM | POA: Diagnosis not present

## 2014-10-07 DIAGNOSIS — Z23 Encounter for immunization: Secondary | ICD-10-CM | POA: Diagnosis not present

## 2014-10-07 DIAGNOSIS — Z992 Dependence on renal dialysis: Secondary | ICD-10-CM | POA: Diagnosis not present

## 2014-10-07 DIAGNOSIS — D631 Anemia in chronic kidney disease: Secondary | ICD-10-CM | POA: Diagnosis not present

## 2014-10-07 DIAGNOSIS — F0390 Unspecified dementia without behavioral disturbance: Secondary | ICD-10-CM | POA: Diagnosis not present

## 2014-10-08 DIAGNOSIS — F0390 Unspecified dementia without behavioral disturbance: Secondary | ICD-10-CM | POA: Diagnosis not present

## 2014-10-09 ENCOUNTER — Telehealth: Payer: Self-pay | Admitting: Family Medicine

## 2014-10-09 DIAGNOSIS — N2581 Secondary hyperparathyroidism of renal origin: Secondary | ICD-10-CM | POA: Diagnosis not present

## 2014-10-09 DIAGNOSIS — Z23 Encounter for immunization: Secondary | ICD-10-CM | POA: Diagnosis not present

## 2014-10-09 DIAGNOSIS — D509 Iron deficiency anemia, unspecified: Secondary | ICD-10-CM | POA: Diagnosis not present

## 2014-10-09 DIAGNOSIS — N186 End stage renal disease: Secondary | ICD-10-CM | POA: Diagnosis not present

## 2014-10-09 DIAGNOSIS — Z992 Dependence on renal dialysis: Secondary | ICD-10-CM | POA: Diagnosis not present

## 2014-10-09 DIAGNOSIS — F0390 Unspecified dementia without behavioral disturbance: Secondary | ICD-10-CM | POA: Diagnosis not present

## 2014-10-09 DIAGNOSIS — D631 Anemia in chronic kidney disease: Secondary | ICD-10-CM | POA: Diagnosis not present

## 2014-10-09 NOTE — Telephone Encounter (Signed)
Tye Maryland states she has sent over e-script on 10/02/14 for patient's aspirin (ASPIRIN LOW DOSE) 81 MG EC tablet. She said that she was going to resend this afternoon and asked that I call her back if we didn't receive it. I waited 30 minutes before calling her back to let her know I was putting in a phone encounter.

## 2014-10-10 DIAGNOSIS — F0390 Unspecified dementia without behavioral disturbance: Secondary | ICD-10-CM | POA: Diagnosis not present

## 2014-10-10 MED ORDER — ASPIRIN 81 MG PO TBEC: DELAYED_RELEASE_TABLET | ORAL | Status: DC

## 2014-10-10 NOTE — Telephone Encounter (Signed)
Prescription sent to pharmacy.

## 2014-10-11 DIAGNOSIS — N2581 Secondary hyperparathyroidism of renal origin: Secondary | ICD-10-CM | POA: Diagnosis not present

## 2014-10-11 DIAGNOSIS — D509 Iron deficiency anemia, unspecified: Secondary | ICD-10-CM | POA: Diagnosis not present

## 2014-10-11 DIAGNOSIS — Z992 Dependence on renal dialysis: Secondary | ICD-10-CM | POA: Diagnosis not present

## 2014-10-11 DIAGNOSIS — Z23 Encounter for immunization: Secondary | ICD-10-CM | POA: Diagnosis not present

## 2014-10-11 DIAGNOSIS — F0390 Unspecified dementia without behavioral disturbance: Secondary | ICD-10-CM | POA: Diagnosis not present

## 2014-10-11 DIAGNOSIS — N186 End stage renal disease: Secondary | ICD-10-CM | POA: Diagnosis not present

## 2014-10-11 DIAGNOSIS — D631 Anemia in chronic kidney disease: Secondary | ICD-10-CM | POA: Diagnosis not present

## 2014-10-13 DIAGNOSIS — F0390 Unspecified dementia without behavioral disturbance: Secondary | ICD-10-CM | POA: Diagnosis not present

## 2014-10-14 DIAGNOSIS — D509 Iron deficiency anemia, unspecified: Secondary | ICD-10-CM | POA: Diagnosis not present

## 2014-10-14 DIAGNOSIS — D631 Anemia in chronic kidney disease: Secondary | ICD-10-CM | POA: Diagnosis not present

## 2014-10-14 DIAGNOSIS — N2581 Secondary hyperparathyroidism of renal origin: Secondary | ICD-10-CM | POA: Diagnosis not present

## 2014-10-14 DIAGNOSIS — Z992 Dependence on renal dialysis: Secondary | ICD-10-CM | POA: Diagnosis not present

## 2014-10-14 DIAGNOSIS — N186 End stage renal disease: Secondary | ICD-10-CM | POA: Diagnosis not present

## 2014-10-15 ENCOUNTER — Encounter: Payer: Self-pay | Admitting: Family Medicine

## 2014-10-15 ENCOUNTER — Ambulatory Visit (INDEPENDENT_AMBULATORY_CARE_PROVIDER_SITE_OTHER): Payer: Commercial Managed Care - HMO | Admitting: Family Medicine

## 2014-10-15 VITALS — BP 142/76 | HR 72 | Temp 98.3°F | Resp 18 | Wt 176.0 lb

## 2014-10-15 DIAGNOSIS — I1311 Hypertensive heart and chronic kidney disease without heart failure, with stage 5 chronic kidney disease, or end stage renal disease: Secondary | ICD-10-CM | POA: Diagnosis not present

## 2014-10-15 DIAGNOSIS — Z992 Dependence on renal dialysis: Secondary | ICD-10-CM | POA: Diagnosis not present

## 2014-10-15 DIAGNOSIS — E1122 Type 2 diabetes mellitus with diabetic chronic kidney disease: Secondary | ICD-10-CM | POA: Diagnosis not present

## 2014-10-15 DIAGNOSIS — N185 Chronic kidney disease, stage 5: Secondary | ICD-10-CM | POA: Diagnosis not present

## 2014-10-15 DIAGNOSIS — N186 End stage renal disease: Secondary | ICD-10-CM

## 2014-10-15 DIAGNOSIS — N189 Chronic kidney disease, unspecified: Secondary | ICD-10-CM | POA: Diagnosis not present

## 2014-10-15 DIAGNOSIS — I5032 Chronic diastolic (congestive) heart failure: Secondary | ICD-10-CM

## 2014-10-15 DIAGNOSIS — E038 Other specified hypothyroidism: Secondary | ICD-10-CM | POA: Diagnosis not present

## 2014-10-15 DIAGNOSIS — E1129 Type 2 diabetes mellitus with other diabetic kidney complication: Secondary | ICD-10-CM | POA: Diagnosis not present

## 2014-10-15 DIAGNOSIS — F0390 Unspecified dementia without behavioral disturbance: Secondary | ICD-10-CM | POA: Diagnosis not present

## 2014-10-15 LAB — CBC WITH DIFFERENTIAL/PLATELET
Basophils Absolute: 0 10*3/uL (ref 0.0–0.1)
Basophils Relative: 0 % (ref 0–1)
Eosinophils Absolute: 0.1 10*3/uL (ref 0.0–0.7)
Eosinophils Relative: 1 % (ref 0–5)
HCT: 36.8 % (ref 36.0–46.0)
Hemoglobin: 11.7 g/dL — ABNORMAL LOW (ref 12.0–15.0)
LYMPHS ABS: 2 10*3/uL (ref 0.7–4.0)
Lymphocytes Relative: 31 % (ref 12–46)
MCH: 25.2 pg — AB (ref 26.0–34.0)
MCHC: 31.8 g/dL (ref 30.0–36.0)
MCV: 79.1 fL (ref 78.0–100.0)
MONOS PCT: 8 % (ref 3–12)
MPV: 10.1 fL (ref 8.6–12.4)
Monocytes Absolute: 0.5 10*3/uL (ref 0.1–1.0)
NEUTROS ABS: 4 10*3/uL (ref 1.7–7.7)
NEUTROS PCT: 60 % (ref 43–77)
Platelets: 202 10*3/uL (ref 150–400)
RBC: 4.65 MIL/uL (ref 3.87–5.11)
RDW: 15.7 % — ABNORMAL HIGH (ref 11.5–15.5)
WBC: 6.6 10*3/uL (ref 4.0–10.5)

## 2014-10-15 LAB — T3, FREE: T3 FREE: 2.6 pg/mL (ref 2.3–4.2)

## 2014-10-15 LAB — T4, FREE: FREE T4: 0.94 ng/dL (ref 0.80–1.80)

## 2014-10-15 LAB — TSH: TSH: 0.809 u[IU]/mL (ref 0.350–4.500)

## 2014-10-15 NOTE — Patient Instructions (Signed)
Continue current medications We will call with lab results [facility] of records[facility] F/U 6 months

## 2014-10-15 NOTE — Progress Notes (Signed)
Patient ID: [name], female   DOB: May 08, 1925, 81 y.o.   MRN: [id number]   Subjective:    Patient ID: [name], female    DOB: Jun 20, 1925, 81 y.o.   MRN: [id number]  Patient presents for check up for stomach issues  patient here today with her daughter. 2 weeks ago she was having difficulties with her blood pressure she was sent to the emergency room but the blood pressure came down on its own. She has since had dialysis without any difficulties. She did not have any medication changes. She also had 3 days of abdominal pain but she did not have a vomiting or change in stools no fever she was concerned it may come from something she ate. The pain is now resolved and she is back to her baseline.  Diabetes mellitus is diet controlled.  Hypothyroidism due for repeat thyroid function tests.  She's had her flu shot at dialysis.    Review Of Systems:  GEN- denies fatigue, fever, weight loss,weakness, recent illness HEENT- denies eye drainage, change in vision, nasal discharge, CVS- denies chest pain, palpitations RESP- denies SOB, cough, wheeze ABD- denies N/V, change in stools, abd pain GU- denies dysuria, hematuria, dribbling, incontinence MSK- denies joint pain, muscle aches, injury Neuro- denies headache, dizziness, syncope, seizure activity       Objective:    BP 142/76 mmHg  Pulse 72  Temp(Src) 98.3 F (36.8 C) (Oral)  Resp 18  Wt 176 lb (79.833 kg)  LMP 12/24/2010 GEN- NAD, alert and oriented x3 HEENT- PERRL, EOMI, non injected sclera, pink conjunctiva, MMM, oropharynx clear Neck- Supple, no thyromegaly CVS- RRR,3/6 SEM  RESP-CTAB ABD-NABS,soft,NT,ND EXT- No edema Pulses- Radial  2+        Assessment & Plan:      Problem List Items Addressed This Visit    Hypothyroidism - Primary   Relevant Orders   TSH   T3, free   T4, free   Diabetes mellitus with renal complications ([place])   Relevant Orders   CBC with Differential/Platelet   Hemoglobin A1c       Note: This dictation was prepared with Dragon dictation along with smaller phrase technology. Any transcriptional errors that result from this process are unintentional.

## 2014-10-16 ENCOUNTER — Encounter: Payer: Self-pay | Admitting: Family Medicine

## 2014-10-16 DIAGNOSIS — F0390 Unspecified dementia without behavioral disturbance: Secondary | ICD-10-CM | POA: Diagnosis not present

## 2014-10-16 DIAGNOSIS — D631 Anemia in chronic kidney disease: Secondary | ICD-10-CM | POA: Diagnosis not present

## 2014-10-16 DIAGNOSIS — N2581 Secondary hyperparathyroidism of renal origin: Secondary | ICD-10-CM | POA: Diagnosis not present

## 2014-10-16 DIAGNOSIS — N186 End stage renal disease: Secondary | ICD-10-CM | POA: Diagnosis not present

## 2014-10-16 DIAGNOSIS — Z992 Dependence on renal dialysis: Secondary | ICD-10-CM | POA: Diagnosis not present

## 2014-10-16 DIAGNOSIS — D509 Iron deficiency anemia, unspecified: Secondary | ICD-10-CM | POA: Diagnosis not present

## 2014-10-16 LAB — HEMOGLOBIN A1C
Hgb A1c MFr Bld: 5.7 % — ABNORMAL HIGH (ref <5.7)
Mean Plasma Glucose: 117 mg/dL — ABNORMAL HIGH (ref <117)

## 2014-10-16 NOTE — Assessment & Plan Note (Signed)
Currently at baseline, assymptomatic, weight is good, HD helps control fluid status

## 2014-10-16 NOTE — Assessment & Plan Note (Signed)
Blood pressure looks good today also her previous gastroenteritis or abdominal pain is also resolved. No change in medications

## 2014-10-16 NOTE — Assessment & Plan Note (Signed)
Recheck TFT continue synthroid

## 2014-10-16 NOTE — Assessment & Plan Note (Signed)
Diet controlled recheck levels

## 2014-10-17 ENCOUNTER — Encounter: Payer: Self-pay | Admitting: *Deleted

## 2014-10-17 DIAGNOSIS — F0390 Unspecified dementia without behavioral disturbance: Secondary | ICD-10-CM | POA: Diagnosis not present

## 2014-10-18 DIAGNOSIS — N2581 Secondary hyperparathyroidism of renal origin: Secondary | ICD-10-CM | POA: Diagnosis not present

## 2014-10-18 DIAGNOSIS — N186 End stage renal disease: Secondary | ICD-10-CM | POA: Diagnosis not present

## 2014-10-18 DIAGNOSIS — D631 Anemia in chronic kidney disease: Secondary | ICD-10-CM | POA: Diagnosis not present

## 2014-10-18 DIAGNOSIS — F0390 Unspecified dementia without behavioral disturbance: Secondary | ICD-10-CM | POA: Diagnosis not present

## 2014-10-18 DIAGNOSIS — D509 Iron deficiency anemia, unspecified: Secondary | ICD-10-CM | POA: Diagnosis not present

## 2014-10-18 DIAGNOSIS — Z992 Dependence on renal dialysis: Secondary | ICD-10-CM | POA: Diagnosis not present

## 2014-10-19 DIAGNOSIS — F0390 Unspecified dementia without behavioral disturbance: Secondary | ICD-10-CM | POA: Diagnosis not present

## 2014-10-20 DIAGNOSIS — F0390 Unspecified dementia without behavioral disturbance: Secondary | ICD-10-CM | POA: Diagnosis not present

## 2014-10-21 DIAGNOSIS — Z992 Dependence on renal dialysis: Secondary | ICD-10-CM | POA: Diagnosis not present

## 2014-10-21 DIAGNOSIS — D631 Anemia in chronic kidney disease: Secondary | ICD-10-CM | POA: Diagnosis not present

## 2014-10-21 DIAGNOSIS — N186 End stage renal disease: Secondary | ICD-10-CM | POA: Diagnosis not present

## 2014-10-21 DIAGNOSIS — F0390 Unspecified dementia without behavioral disturbance: Secondary | ICD-10-CM | POA: Diagnosis not present

## 2014-10-21 DIAGNOSIS — N2581 Secondary hyperparathyroidism of renal origin: Secondary | ICD-10-CM | POA: Diagnosis not present

## 2014-10-21 DIAGNOSIS — D509 Iron deficiency anemia, unspecified: Secondary | ICD-10-CM | POA: Diagnosis not present

## 2014-10-22 DIAGNOSIS — F0390 Unspecified dementia without behavioral disturbance: Secondary | ICD-10-CM | POA: Diagnosis not present

## 2014-10-23 DIAGNOSIS — N186 End stage renal disease: Secondary | ICD-10-CM | POA: Diagnosis not present

## 2014-10-23 DIAGNOSIS — Z992 Dependence on renal dialysis: Secondary | ICD-10-CM | POA: Diagnosis not present

## 2014-10-23 DIAGNOSIS — N2581 Secondary hyperparathyroidism of renal origin: Secondary | ICD-10-CM | POA: Diagnosis not present

## 2014-10-23 DIAGNOSIS — D509 Iron deficiency anemia, unspecified: Secondary | ICD-10-CM | POA: Diagnosis not present

## 2014-10-23 DIAGNOSIS — D631 Anemia in chronic kidney disease: Secondary | ICD-10-CM | POA: Diagnosis not present

## 2014-10-23 DIAGNOSIS — F0390 Unspecified dementia without behavioral disturbance: Secondary | ICD-10-CM | POA: Diagnosis not present

## 2014-10-24 DIAGNOSIS — F0390 Unspecified dementia without behavioral disturbance: Secondary | ICD-10-CM | POA: Diagnosis not present

## 2014-10-25 DIAGNOSIS — D509 Iron deficiency anemia, unspecified: Secondary | ICD-10-CM | POA: Diagnosis not present

## 2014-10-25 DIAGNOSIS — N2581 Secondary hyperparathyroidism of renal origin: Secondary | ICD-10-CM | POA: Diagnosis not present

## 2014-10-25 DIAGNOSIS — F0390 Unspecified dementia without behavioral disturbance: Secondary | ICD-10-CM | POA: Diagnosis not present

## 2014-10-25 DIAGNOSIS — N186 End stage renal disease: Secondary | ICD-10-CM | POA: Diagnosis not present

## 2014-10-25 DIAGNOSIS — D631 Anemia in chronic kidney disease: Secondary | ICD-10-CM | POA: Diagnosis not present

## 2014-10-25 DIAGNOSIS — Z992 Dependence on renal dialysis: Secondary | ICD-10-CM | POA: Diagnosis not present

## 2014-10-26 DIAGNOSIS — F0390 Unspecified dementia without behavioral disturbance: Secondary | ICD-10-CM | POA: Diagnosis not present

## 2014-10-27 DIAGNOSIS — F0390 Unspecified dementia without behavioral disturbance: Secondary | ICD-10-CM | POA: Diagnosis not present

## 2014-10-28 DIAGNOSIS — D631 Anemia in chronic kidney disease: Secondary | ICD-10-CM | POA: Diagnosis not present

## 2014-10-28 DIAGNOSIS — D509 Iron deficiency anemia, unspecified: Secondary | ICD-10-CM | POA: Diagnosis not present

## 2014-10-28 DIAGNOSIS — F0390 Unspecified dementia without behavioral disturbance: Secondary | ICD-10-CM | POA: Diagnosis not present

## 2014-10-28 DIAGNOSIS — N186 End stage renal disease: Secondary | ICD-10-CM | POA: Diagnosis not present

## 2014-10-28 DIAGNOSIS — Z992 Dependence on renal dialysis: Secondary | ICD-10-CM | POA: Diagnosis not present

## 2014-10-28 DIAGNOSIS — N2581 Secondary hyperparathyroidism of renal origin: Secondary | ICD-10-CM | POA: Diagnosis not present

## 2014-10-29 DIAGNOSIS — F0390 Unspecified dementia without behavioral disturbance: Secondary | ICD-10-CM | POA: Diagnosis not present

## 2014-10-30 DIAGNOSIS — D509 Iron deficiency anemia, unspecified: Secondary | ICD-10-CM | POA: Diagnosis not present

## 2014-10-30 DIAGNOSIS — Z992 Dependence on renal dialysis: Secondary | ICD-10-CM | POA: Diagnosis not present

## 2014-10-30 DIAGNOSIS — D631 Anemia in chronic kidney disease: Secondary | ICD-10-CM | POA: Diagnosis not present

## 2014-10-30 DIAGNOSIS — N186 End stage renal disease: Secondary | ICD-10-CM | POA: Diagnosis not present

## 2014-10-30 DIAGNOSIS — N2581 Secondary hyperparathyroidism of renal origin: Secondary | ICD-10-CM | POA: Diagnosis not present

## 2014-10-30 DIAGNOSIS — F0390 Unspecified dementia without behavioral disturbance: Secondary | ICD-10-CM | POA: Diagnosis not present

## 2014-10-31 DIAGNOSIS — F0390 Unspecified dementia without behavioral disturbance: Secondary | ICD-10-CM | POA: Diagnosis not present

## 2014-11-01 DIAGNOSIS — F0390 Unspecified dementia without behavioral disturbance: Secondary | ICD-10-CM | POA: Diagnosis not present

## 2014-11-01 DIAGNOSIS — D509 Iron deficiency anemia, unspecified: Secondary | ICD-10-CM | POA: Diagnosis not present

## 2014-11-01 DIAGNOSIS — D631 Anemia in chronic kidney disease: Secondary | ICD-10-CM | POA: Diagnosis not present

## 2014-11-01 DIAGNOSIS — Z992 Dependence on renal dialysis: Secondary | ICD-10-CM | POA: Diagnosis not present

## 2014-11-01 DIAGNOSIS — N2581 Secondary hyperparathyroidism of renal origin: Secondary | ICD-10-CM | POA: Diagnosis not present

## 2014-11-01 DIAGNOSIS — N186 End stage renal disease: Secondary | ICD-10-CM | POA: Diagnosis not present

## 2014-11-02 DIAGNOSIS — F0390 Unspecified dementia without behavioral disturbance: Secondary | ICD-10-CM | POA: Diagnosis not present

## 2014-11-03 DIAGNOSIS — F0390 Unspecified dementia without behavioral disturbance: Secondary | ICD-10-CM | POA: Diagnosis not present

## 2014-11-04 DIAGNOSIS — N186 End stage renal disease: Secondary | ICD-10-CM | POA: Diagnosis not present

## 2014-11-04 DIAGNOSIS — D509 Iron deficiency anemia, unspecified: Secondary | ICD-10-CM | POA: Diagnosis not present

## 2014-11-04 DIAGNOSIS — F0390 Unspecified dementia without behavioral disturbance: Secondary | ICD-10-CM | POA: Diagnosis not present

## 2014-11-04 DIAGNOSIS — D631 Anemia in chronic kidney disease: Secondary | ICD-10-CM | POA: Diagnosis not present

## 2014-11-04 DIAGNOSIS — N2581 Secondary hyperparathyroidism of renal origin: Secondary | ICD-10-CM | POA: Diagnosis not present

## 2014-11-04 DIAGNOSIS — Z992 Dependence on renal dialysis: Secondary | ICD-10-CM | POA: Diagnosis not present

## 2014-11-05 DIAGNOSIS — F0390 Unspecified dementia without behavioral disturbance: Secondary | ICD-10-CM | POA: Diagnosis not present

## 2014-11-06 ENCOUNTER — Other Ambulatory Visit: Payer: Self-pay | Admitting: Family Medicine

## 2014-11-06 DIAGNOSIS — N2581 Secondary hyperparathyroidism of renal origin: Secondary | ICD-10-CM | POA: Diagnosis not present

## 2014-11-06 DIAGNOSIS — N186 End stage renal disease: Secondary | ICD-10-CM | POA: Diagnosis not present

## 2014-11-06 DIAGNOSIS — Z992 Dependence on renal dialysis: Secondary | ICD-10-CM | POA: Diagnosis not present

## 2014-11-06 DIAGNOSIS — D509 Iron deficiency anemia, unspecified: Secondary | ICD-10-CM | POA: Diagnosis not present

## 2014-11-06 DIAGNOSIS — D631 Anemia in chronic kidney disease: Secondary | ICD-10-CM | POA: Diagnosis not present

## 2014-11-06 DIAGNOSIS — F0390 Unspecified dementia without behavioral disturbance: Secondary | ICD-10-CM | POA: Diagnosis not present

## 2014-11-06 MED ORDER — DAILY VITE PO TABS: 1.0000 | ORAL_TABLET | Freq: Every day | ORAL | Status: DC

## 2014-11-06 MED ORDER — CITALOPRAM HYDROBROMIDE 20 MG PO TABS: 20.0000 mg | ORAL_TABLET | ORAL | Status: DC

## 2014-11-06 MED ORDER — DOCUSATE SODIUM 100 MG PO CAPS: 100.0000 mg | ORAL_CAPSULE | Freq: Two times a day (BID) | ORAL | Status: DC

## 2014-11-06 MED ORDER — ISOSORBIDE MONONITRATE ER 60 MG PO TB24: 60.0000 mg | ORAL_TABLET | Freq: Every day | ORAL | Status: DC

## 2014-11-06 MED ORDER — AMLODIPINE BESYLATE 10 MG PO TABS: 10.0000 mg | ORAL_TABLET | ORAL | Status: DC

## 2014-11-06 MED ORDER — LEVOTHYROXINE SODIUM 25 MCG PO TABS: 25.0000 ug | ORAL_TABLET | Freq: Every day | ORAL | Status: DC

## 2014-11-06 NOTE — Telephone Encounter (Signed)
Medication refilled per protocol. 

## 2014-11-07 DIAGNOSIS — F0390 Unspecified dementia without behavioral disturbance: Secondary | ICD-10-CM | POA: Diagnosis not present

## 2014-11-08 DIAGNOSIS — D509 Iron deficiency anemia, unspecified: Secondary | ICD-10-CM | POA: Diagnosis not present

## 2014-11-08 DIAGNOSIS — F0390 Unspecified dementia without behavioral disturbance: Secondary | ICD-10-CM | POA: Diagnosis not present

## 2014-11-08 DIAGNOSIS — Z992 Dependence on renal dialysis: Secondary | ICD-10-CM | POA: Diagnosis not present

## 2014-11-08 DIAGNOSIS — D631 Anemia in chronic kidney disease: Secondary | ICD-10-CM | POA: Diagnosis not present

## 2014-11-08 DIAGNOSIS — N2581 Secondary hyperparathyroidism of renal origin: Secondary | ICD-10-CM | POA: Diagnosis not present

## 2014-11-08 DIAGNOSIS — N186 End stage renal disease: Secondary | ICD-10-CM | POA: Diagnosis not present

## 2014-11-09 DIAGNOSIS — F0390 Unspecified dementia without behavioral disturbance: Secondary | ICD-10-CM | POA: Diagnosis not present

## 2014-11-10 DIAGNOSIS — F0390 Unspecified dementia without behavioral disturbance: Secondary | ICD-10-CM | POA: Diagnosis not present

## 2014-11-11 DIAGNOSIS — D509 Iron deficiency anemia, unspecified: Secondary | ICD-10-CM | POA: Diagnosis not present

## 2014-11-11 DIAGNOSIS — F0390 Unspecified dementia without behavioral disturbance: Secondary | ICD-10-CM | POA: Diagnosis not present

## 2014-11-11 DIAGNOSIS — D631 Anemia in chronic kidney disease: Secondary | ICD-10-CM | POA: Diagnosis not present

## 2014-11-11 DIAGNOSIS — Z992 Dependence on renal dialysis: Secondary | ICD-10-CM | POA: Diagnosis not present

## 2014-11-11 DIAGNOSIS — N186 End stage renal disease: Secondary | ICD-10-CM | POA: Diagnosis not present

## 2014-11-11 DIAGNOSIS — N2581 Secondary hyperparathyroidism of renal origin: Secondary | ICD-10-CM | POA: Diagnosis not present

## 2014-11-12 DIAGNOSIS — F0390 Unspecified dementia without behavioral disturbance: Secondary | ICD-10-CM | POA: Diagnosis not present

## 2014-11-13 DIAGNOSIS — F0390 Unspecified dementia without behavioral disturbance: Secondary | ICD-10-CM | POA: Diagnosis not present

## 2014-11-13 DIAGNOSIS — Z992 Dependence on renal dialysis: Secondary | ICD-10-CM | POA: Diagnosis not present

## 2014-11-13 DIAGNOSIS — D631 Anemia in chronic kidney disease: Secondary | ICD-10-CM | POA: Diagnosis not present

## 2014-11-13 DIAGNOSIS — N186 End stage renal disease: Secondary | ICD-10-CM | POA: Diagnosis not present

## 2014-11-13 DIAGNOSIS — N2581 Secondary hyperparathyroidism of renal origin: Secondary | ICD-10-CM | POA: Diagnosis not present

## 2014-11-13 DIAGNOSIS — D509 Iron deficiency anemia, unspecified: Secondary | ICD-10-CM | POA: Diagnosis not present

## 2014-11-14 DIAGNOSIS — F0390 Unspecified dementia without behavioral disturbance: Secondary | ICD-10-CM | POA: Diagnosis not present

## 2014-11-15 DIAGNOSIS — N2581 Secondary hyperparathyroidism of renal origin: Secondary | ICD-10-CM | POA: Diagnosis not present

## 2014-11-15 DIAGNOSIS — N186 End stage renal disease: Secondary | ICD-10-CM | POA: Diagnosis not present

## 2014-11-15 DIAGNOSIS — F0390 Unspecified dementia without behavioral disturbance: Secondary | ICD-10-CM | POA: Diagnosis not present

## 2014-11-15 DIAGNOSIS — D509 Iron deficiency anemia, unspecified: Secondary | ICD-10-CM | POA: Diagnosis not present

## 2014-11-15 DIAGNOSIS — D631 Anemia in chronic kidney disease: Secondary | ICD-10-CM | POA: Diagnosis not present

## 2014-11-15 DIAGNOSIS — Z992 Dependence on renal dialysis: Secondary | ICD-10-CM | POA: Diagnosis not present

## 2014-11-16 DIAGNOSIS — F0390 Unspecified dementia without behavioral disturbance: Secondary | ICD-10-CM | POA: Diagnosis not present

## 2014-11-17 DIAGNOSIS — F0390 Unspecified dementia without behavioral disturbance: Secondary | ICD-10-CM | POA: Diagnosis not present

## 2014-11-18 DIAGNOSIS — D509 Iron deficiency anemia, unspecified: Secondary | ICD-10-CM | POA: Diagnosis not present

## 2014-11-18 DIAGNOSIS — D631 Anemia in chronic kidney disease: Secondary | ICD-10-CM | POA: Diagnosis not present

## 2014-11-18 DIAGNOSIS — Z992 Dependence on renal dialysis: Secondary | ICD-10-CM | POA: Diagnosis not present

## 2014-11-18 DIAGNOSIS — N2581 Secondary hyperparathyroidism of renal origin: Secondary | ICD-10-CM | POA: Diagnosis not present

## 2014-11-18 DIAGNOSIS — F0390 Unspecified dementia without behavioral disturbance: Secondary | ICD-10-CM | POA: Diagnosis not present

## 2014-11-18 DIAGNOSIS — N186 End stage renal disease: Secondary | ICD-10-CM | POA: Diagnosis not present

## 2014-11-19 DIAGNOSIS — E113393 Type 2 diabetes mellitus with moderate nonproliferative diabetic retinopathy without macular edema, bilateral: Secondary | ICD-10-CM | POA: Diagnosis not present

## 2014-11-19 DIAGNOSIS — F0390 Unspecified dementia without behavioral disturbance: Secondary | ICD-10-CM | POA: Diagnosis not present

## 2014-11-19 DIAGNOSIS — H34812 Central retinal vein occlusion, left eye, with macular edema: Secondary | ICD-10-CM | POA: Diagnosis not present

## 2014-11-20 DIAGNOSIS — N2581 Secondary hyperparathyroidism of renal origin: Secondary | ICD-10-CM | POA: Diagnosis not present

## 2014-11-20 DIAGNOSIS — D631 Anemia in chronic kidney disease: Secondary | ICD-10-CM | POA: Diagnosis not present

## 2014-11-20 DIAGNOSIS — N186 End stage renal disease: Secondary | ICD-10-CM | POA: Diagnosis not present

## 2014-11-20 DIAGNOSIS — Z992 Dependence on renal dialysis: Secondary | ICD-10-CM | POA: Diagnosis not present

## 2014-11-20 DIAGNOSIS — D509 Iron deficiency anemia, unspecified: Secondary | ICD-10-CM | POA: Diagnosis not present

## 2014-11-20 DIAGNOSIS — F0390 Unspecified dementia without behavioral disturbance: Secondary | ICD-10-CM | POA: Diagnosis not present

## 2014-11-21 DIAGNOSIS — F0390 Unspecified dementia without behavioral disturbance: Secondary | ICD-10-CM | POA: Diagnosis not present

## 2014-11-22 DIAGNOSIS — F0390 Unspecified dementia without behavioral disturbance: Secondary | ICD-10-CM | POA: Diagnosis not present

## 2014-11-22 DIAGNOSIS — N186 End stage renal disease: Secondary | ICD-10-CM | POA: Diagnosis not present

## 2014-11-22 DIAGNOSIS — Z992 Dependence on renal dialysis: Secondary | ICD-10-CM | POA: Diagnosis not present

## 2014-11-22 DIAGNOSIS — N2581 Secondary hyperparathyroidism of renal origin: Secondary | ICD-10-CM | POA: Diagnosis not present

## 2014-11-22 DIAGNOSIS — D509 Iron deficiency anemia, unspecified: Secondary | ICD-10-CM | POA: Diagnosis not present

## 2014-11-22 DIAGNOSIS — D631 Anemia in chronic kidney disease: Secondary | ICD-10-CM | POA: Diagnosis not present

## 2014-11-23 DIAGNOSIS — F0390 Unspecified dementia without behavioral disturbance: Secondary | ICD-10-CM | POA: Diagnosis not present

## 2014-11-24 DIAGNOSIS — F0390 Unspecified dementia without behavioral disturbance: Secondary | ICD-10-CM | POA: Diagnosis not present

## 2014-11-25 DIAGNOSIS — N2581 Secondary hyperparathyroidism of renal origin: Secondary | ICD-10-CM | POA: Diagnosis not present

## 2014-11-25 DIAGNOSIS — F0390 Unspecified dementia without behavioral disturbance: Secondary | ICD-10-CM | POA: Diagnosis not present

## 2014-11-25 DIAGNOSIS — D509 Iron deficiency anemia, unspecified: Secondary | ICD-10-CM | POA: Diagnosis not present

## 2014-11-25 DIAGNOSIS — D631 Anemia in chronic kidney disease: Secondary | ICD-10-CM | POA: Diagnosis not present

## 2014-11-25 DIAGNOSIS — Z992 Dependence on renal dialysis: Secondary | ICD-10-CM | POA: Diagnosis not present

## 2014-11-25 DIAGNOSIS — N186 End stage renal disease: Secondary | ICD-10-CM | POA: Diagnosis not present

## 2014-11-26 ENCOUNTER — Telehealth: Payer: Self-pay | Admitting: Family Medicine

## 2014-11-26 DIAGNOSIS — F0390 Unspecified dementia without behavioral disturbance: Secondary | ICD-10-CM | POA: Diagnosis not present

## 2014-11-26 NOTE — Telephone Encounter (Signed)
[facility] calling to get orders for this patient  [phone] Judson Roch

## 2014-11-26 NOTE — Telephone Encounter (Signed)
Call placed to [name].   States that pt has had fall and requested VO for PT to eval and tx.   VO given.

## 2014-11-26 NOTE — Telephone Encounter (Signed)
Agree with above 

## 2014-11-27 DIAGNOSIS — F0390 Unspecified dementia without behavioral disturbance: Secondary | ICD-10-CM | POA: Diagnosis not present

## 2014-11-27 DIAGNOSIS — N186 End stage renal disease: Secondary | ICD-10-CM | POA: Diagnosis not present

## 2014-11-27 DIAGNOSIS — D509 Iron deficiency anemia, unspecified: Secondary | ICD-10-CM | POA: Diagnosis not present

## 2014-11-27 DIAGNOSIS — D631 Anemia in chronic kidney disease: Secondary | ICD-10-CM | POA: Diagnosis not present

## 2014-11-27 DIAGNOSIS — N2581 Secondary hyperparathyroidism of renal origin: Secondary | ICD-10-CM | POA: Diagnosis not present

## 2014-11-27 DIAGNOSIS — Z992 Dependence on renal dialysis: Secondary | ICD-10-CM | POA: Diagnosis not present

## 2014-11-28 DIAGNOSIS — D631 Anemia in chronic kidney disease: Secondary | ICD-10-CM | POA: Diagnosis not present

## 2014-11-28 DIAGNOSIS — I5020 Unspecified systolic (congestive) heart failure: Secondary | ICD-10-CM | POA: Diagnosis not present

## 2014-11-28 DIAGNOSIS — Z1231 Encounter for screening mammogram for malignant neoplasm of breast: Secondary | ICD-10-CM | POA: Diagnosis not present

## 2014-11-28 DIAGNOSIS — E1122 Type 2 diabetes mellitus with diabetic chronic kidney disease: Secondary | ICD-10-CM | POA: Diagnosis not present

## 2014-11-28 DIAGNOSIS — M4000 Postural kyphosis, site unspecified: Secondary | ICD-10-CM | POA: Diagnosis not present

## 2014-11-28 DIAGNOSIS — S0990XD Unspecified injury of head, subsequent encounter: Secondary | ICD-10-CM | POA: Diagnosis not present

## 2014-11-28 DIAGNOSIS — N186 End stage renal disease: Secondary | ICD-10-CM | POA: Diagnosis not present

## 2014-11-28 DIAGNOSIS — M150 Primary generalized (osteo)arthritis: Secondary | ICD-10-CM | POA: Diagnosis not present

## 2014-11-28 DIAGNOSIS — F0390 Unspecified dementia without behavioral disturbance: Secondary | ICD-10-CM | POA: Diagnosis not present

## 2014-11-28 DIAGNOSIS — I120 Hypertensive chronic kidney disease with stage 5 chronic kidney disease or end stage renal disease: Secondary | ICD-10-CM | POA: Diagnosis not present

## 2014-11-28 DIAGNOSIS — F419 Anxiety disorder, unspecified: Secondary | ICD-10-CM | POA: Diagnosis not present

## 2014-11-29 ENCOUNTER — Telehealth: Payer: Self-pay | Admitting: Family Medicine

## 2014-11-29 DIAGNOSIS — F0390 Unspecified dementia without behavioral disturbance: Secondary | ICD-10-CM | POA: Diagnosis not present

## 2014-11-29 DIAGNOSIS — N186 End stage renal disease: Secondary | ICD-10-CM | POA: Diagnosis not present

## 2014-11-29 DIAGNOSIS — R296 Repeated falls: Secondary | ICD-10-CM

## 2014-11-29 DIAGNOSIS — N2581 Secondary hyperparathyroidism of renal origin: Secondary | ICD-10-CM | POA: Diagnosis not present

## 2014-11-29 DIAGNOSIS — D509 Iron deficiency anemia, unspecified: Secondary | ICD-10-CM | POA: Diagnosis not present

## 2014-11-29 DIAGNOSIS — Z992 Dependence on renal dialysis: Secondary | ICD-10-CM | POA: Diagnosis not present

## 2014-11-29 DIAGNOSIS — R2681 Unsteadiness on feet: Secondary | ICD-10-CM

## 2014-11-29 DIAGNOSIS — D631 Anemia in chronic kidney disease: Secondary | ICD-10-CM | POA: Diagnosis not present

## 2014-11-29 MED ORDER — UNABLE TO FIND: Status: DC

## 2014-11-29 NOTE — Telephone Encounter (Signed)
rx for walker faxed to therapist

## 2014-11-29 NOTE — Telephone Encounter (Signed)
Okay to write orders for walker

## 2014-11-29 NOTE — Telephone Encounter (Signed)
P.T at assisted living has done therapy eval.  Want approval for twice a week for 8 weeks therapy (non dialysis days)   Please order for rolling walker w/ seat attachment.  Dx unsteady gait, frequent falls.  Approval given for P.T.  Need to write Rx for walker and fax to them.  Plan of care to follow next week for your signature.

## 2014-11-30 DIAGNOSIS — F0390 Unspecified dementia without behavioral disturbance: Secondary | ICD-10-CM | POA: Diagnosis not present

## 2014-12-01 DIAGNOSIS — D631 Anemia in chronic kidney disease: Secondary | ICD-10-CM | POA: Diagnosis not present

## 2014-12-01 DIAGNOSIS — F0390 Unspecified dementia without behavioral disturbance: Secondary | ICD-10-CM | POA: Diagnosis not present

## 2014-12-01 DIAGNOSIS — S0990XD Unspecified injury of head, subsequent encounter: Secondary | ICD-10-CM | POA: Diagnosis not present

## 2014-12-01 DIAGNOSIS — I120 Hypertensive chronic kidney disease with stage 5 chronic kidney disease or end stage renal disease: Secondary | ICD-10-CM | POA: Diagnosis not present

## 2014-12-01 DIAGNOSIS — M4000 Postural kyphosis, site unspecified: Secondary | ICD-10-CM | POA: Diagnosis not present

## 2014-12-01 DIAGNOSIS — M150 Primary generalized (osteo)arthritis: Secondary | ICD-10-CM | POA: Diagnosis not present

## 2014-12-01 DIAGNOSIS — E1122 Type 2 diabetes mellitus with diabetic chronic kidney disease: Secondary | ICD-10-CM | POA: Diagnosis not present

## 2014-12-01 DIAGNOSIS — N186 End stage renal disease: Secondary | ICD-10-CM | POA: Diagnosis not present

## 2014-12-01 DIAGNOSIS — I5020 Unspecified systolic (congestive) heart failure: Secondary | ICD-10-CM | POA: Diagnosis not present

## 2014-12-01 DIAGNOSIS — F419 Anxiety disorder, unspecified: Secondary | ICD-10-CM | POA: Diagnosis not present

## 2014-12-02 DIAGNOSIS — F0390 Unspecified dementia without behavioral disturbance: Secondary | ICD-10-CM | POA: Diagnosis not present

## 2014-12-02 DIAGNOSIS — D631 Anemia in chronic kidney disease: Secondary | ICD-10-CM | POA: Diagnosis not present

## 2014-12-02 DIAGNOSIS — D509 Iron deficiency anemia, unspecified: Secondary | ICD-10-CM | POA: Diagnosis not present

## 2014-12-02 DIAGNOSIS — N186 End stage renal disease: Secondary | ICD-10-CM | POA: Diagnosis not present

## 2014-12-02 DIAGNOSIS — N2581 Secondary hyperparathyroidism of renal origin: Secondary | ICD-10-CM | POA: Diagnosis not present

## 2014-12-02 DIAGNOSIS — Z992 Dependence on renal dialysis: Secondary | ICD-10-CM | POA: Diagnosis not present

## 2014-12-03 DIAGNOSIS — F419 Anxiety disorder, unspecified: Secondary | ICD-10-CM | POA: Diagnosis not present

## 2014-12-03 DIAGNOSIS — D631 Anemia in chronic kidney disease: Secondary | ICD-10-CM | POA: Diagnosis not present

## 2014-12-03 DIAGNOSIS — S0990XD Unspecified injury of head, subsequent encounter: Secondary | ICD-10-CM | POA: Diagnosis not present

## 2014-12-03 DIAGNOSIS — M4000 Postural kyphosis, site unspecified: Secondary | ICD-10-CM | POA: Diagnosis not present

## 2014-12-03 DIAGNOSIS — F0390 Unspecified dementia without behavioral disturbance: Secondary | ICD-10-CM | POA: Diagnosis not present

## 2014-12-03 DIAGNOSIS — I120 Hypertensive chronic kidney disease with stage 5 chronic kidney disease or end stage renal disease: Secondary | ICD-10-CM | POA: Diagnosis not present

## 2014-12-03 DIAGNOSIS — M150 Primary generalized (osteo)arthritis: Secondary | ICD-10-CM | POA: Diagnosis not present

## 2014-12-03 DIAGNOSIS — E1122 Type 2 diabetes mellitus with diabetic chronic kidney disease: Secondary | ICD-10-CM | POA: Diagnosis not present

## 2014-12-03 DIAGNOSIS — I5020 Unspecified systolic (congestive) heart failure: Secondary | ICD-10-CM | POA: Diagnosis not present

## 2014-12-03 DIAGNOSIS — N186 End stage renal disease: Secondary | ICD-10-CM | POA: Diagnosis not present

## 2014-12-04 DIAGNOSIS — N186 End stage renal disease: Secondary | ICD-10-CM | POA: Diagnosis not present

## 2014-12-04 DIAGNOSIS — Z992 Dependence on renal dialysis: Secondary | ICD-10-CM | POA: Diagnosis not present

## 2014-12-04 DIAGNOSIS — D631 Anemia in chronic kidney disease: Secondary | ICD-10-CM | POA: Diagnosis not present

## 2014-12-04 DIAGNOSIS — F0390 Unspecified dementia without behavioral disturbance: Secondary | ICD-10-CM | POA: Diagnosis not present

## 2014-12-04 DIAGNOSIS — N2581 Secondary hyperparathyroidism of renal origin: Secondary | ICD-10-CM | POA: Diagnosis not present

## 2014-12-04 DIAGNOSIS — D509 Iron deficiency anemia, unspecified: Secondary | ICD-10-CM | POA: Diagnosis not present

## 2014-12-05 DIAGNOSIS — F0390 Unspecified dementia without behavioral disturbance: Secondary | ICD-10-CM | POA: Diagnosis not present

## 2014-12-06 DIAGNOSIS — D631 Anemia in chronic kidney disease: Secondary | ICD-10-CM | POA: Diagnosis not present

## 2014-12-06 DIAGNOSIS — N186 End stage renal disease: Secondary | ICD-10-CM | POA: Diagnosis not present

## 2014-12-06 DIAGNOSIS — Z992 Dependence on renal dialysis: Secondary | ICD-10-CM | POA: Diagnosis not present

## 2014-12-06 DIAGNOSIS — F0390 Unspecified dementia without behavioral disturbance: Secondary | ICD-10-CM | POA: Diagnosis not present

## 2014-12-06 DIAGNOSIS — N2581 Secondary hyperparathyroidism of renal origin: Secondary | ICD-10-CM | POA: Diagnosis not present

## 2014-12-06 DIAGNOSIS — D509 Iron deficiency anemia, unspecified: Secondary | ICD-10-CM | POA: Diagnosis not present

## 2014-12-07 DIAGNOSIS — F0390 Unspecified dementia without behavioral disturbance: Secondary | ICD-10-CM | POA: Diagnosis not present

## 2014-12-08 DIAGNOSIS — F0390 Unspecified dementia without behavioral disturbance: Secondary | ICD-10-CM | POA: Diagnosis not present

## 2014-12-09 DIAGNOSIS — D509 Iron deficiency anemia, unspecified: Secondary | ICD-10-CM | POA: Diagnosis not present

## 2014-12-09 DIAGNOSIS — N186 End stage renal disease: Secondary | ICD-10-CM | POA: Diagnosis not present

## 2014-12-09 DIAGNOSIS — F0390 Unspecified dementia without behavioral disturbance: Secondary | ICD-10-CM | POA: Diagnosis not present

## 2014-12-09 DIAGNOSIS — N2581 Secondary hyperparathyroidism of renal origin: Secondary | ICD-10-CM | POA: Diagnosis not present

## 2014-12-09 DIAGNOSIS — D631 Anemia in chronic kidney disease: Secondary | ICD-10-CM | POA: Diagnosis not present

## 2014-12-09 DIAGNOSIS — Z992 Dependence on renal dialysis: Secondary | ICD-10-CM | POA: Diagnosis not present

## 2014-12-10 DIAGNOSIS — N186 End stage renal disease: Secondary | ICD-10-CM | POA: Diagnosis not present

## 2014-12-10 DIAGNOSIS — F0390 Unspecified dementia without behavioral disturbance: Secondary | ICD-10-CM | POA: Diagnosis not present

## 2014-12-10 DIAGNOSIS — E1122 Type 2 diabetes mellitus with diabetic chronic kidney disease: Secondary | ICD-10-CM | POA: Diagnosis not present

## 2014-12-10 DIAGNOSIS — M150 Primary generalized (osteo)arthritis: Secondary | ICD-10-CM | POA: Diagnosis not present

## 2014-12-10 DIAGNOSIS — S0990XD Unspecified injury of head, subsequent encounter: Secondary | ICD-10-CM | POA: Diagnosis not present

## 2014-12-10 DIAGNOSIS — I120 Hypertensive chronic kidney disease with stage 5 chronic kidney disease or end stage renal disease: Secondary | ICD-10-CM | POA: Diagnosis not present

## 2014-12-10 DIAGNOSIS — F419 Anxiety disorder, unspecified: Secondary | ICD-10-CM | POA: Diagnosis not present

## 2014-12-10 DIAGNOSIS — D631 Anemia in chronic kidney disease: Secondary | ICD-10-CM | POA: Diagnosis not present

## 2014-12-10 DIAGNOSIS — I5020 Unspecified systolic (congestive) heart failure: Secondary | ICD-10-CM | POA: Diagnosis not present

## 2014-12-10 DIAGNOSIS — M4000 Postural kyphosis, site unspecified: Secondary | ICD-10-CM | POA: Diagnosis not present

## 2014-12-11 DIAGNOSIS — N2581 Secondary hyperparathyroidism of renal origin: Secondary | ICD-10-CM | POA: Diagnosis not present

## 2014-12-11 DIAGNOSIS — D509 Iron deficiency anemia, unspecified: Secondary | ICD-10-CM | POA: Diagnosis not present

## 2014-12-11 DIAGNOSIS — Z992 Dependence on renal dialysis: Secondary | ICD-10-CM | POA: Diagnosis not present

## 2014-12-11 DIAGNOSIS — D631 Anemia in chronic kidney disease: Secondary | ICD-10-CM | POA: Diagnosis not present

## 2014-12-11 DIAGNOSIS — N186 End stage renal disease: Secondary | ICD-10-CM | POA: Diagnosis not present

## 2014-12-11 DIAGNOSIS — F0390 Unspecified dementia without behavioral disturbance: Secondary | ICD-10-CM | POA: Diagnosis not present

## 2014-12-12 DIAGNOSIS — D631 Anemia in chronic kidney disease: Secondary | ICD-10-CM | POA: Diagnosis not present

## 2014-12-12 DIAGNOSIS — I120 Hypertensive chronic kidney disease with stage 5 chronic kidney disease or end stage renal disease: Secondary | ICD-10-CM | POA: Diagnosis not present

## 2014-12-12 DIAGNOSIS — M4000 Postural kyphosis, site unspecified: Secondary | ICD-10-CM | POA: Diagnosis not present

## 2014-12-12 DIAGNOSIS — F0390 Unspecified dementia without behavioral disturbance: Secondary | ICD-10-CM | POA: Diagnosis not present

## 2014-12-12 DIAGNOSIS — F419 Anxiety disorder, unspecified: Secondary | ICD-10-CM | POA: Diagnosis not present

## 2014-12-12 DIAGNOSIS — E1122 Type 2 diabetes mellitus with diabetic chronic kidney disease: Secondary | ICD-10-CM | POA: Diagnosis not present

## 2014-12-12 DIAGNOSIS — S0990XD Unspecified injury of head, subsequent encounter: Secondary | ICD-10-CM | POA: Diagnosis not present

## 2014-12-12 DIAGNOSIS — M150 Primary generalized (osteo)arthritis: Secondary | ICD-10-CM | POA: Diagnosis not present

## 2014-12-12 DIAGNOSIS — N186 End stage renal disease: Secondary | ICD-10-CM | POA: Diagnosis not present

## 2014-12-12 DIAGNOSIS — I5020 Unspecified systolic (congestive) heart failure: Secondary | ICD-10-CM | POA: Diagnosis not present

## 2014-12-13 ENCOUNTER — Other Ambulatory Visit: Payer: Self-pay

## 2014-12-13 DIAGNOSIS — D631 Anemia in chronic kidney disease: Secondary | ICD-10-CM | POA: Diagnosis not present

## 2014-12-13 DIAGNOSIS — Z992 Dependence on renal dialysis: Secondary | ICD-10-CM | POA: Diagnosis not present

## 2014-12-13 DIAGNOSIS — N2581 Secondary hyperparathyroidism of renal origin: Secondary | ICD-10-CM | POA: Diagnosis not present

## 2014-12-13 DIAGNOSIS — D509 Iron deficiency anemia, unspecified: Secondary | ICD-10-CM | POA: Diagnosis not present

## 2014-12-13 DIAGNOSIS — N186 End stage renal disease: Secondary | ICD-10-CM | POA: Diagnosis not present

## 2014-12-13 DIAGNOSIS — F0390 Unspecified dementia without behavioral disturbance: Secondary | ICD-10-CM | POA: Diagnosis not present

## 2014-12-13 NOTE — Patient Outreach (Signed)
[place] [facility]) Care Management  12/13/2014  [name] 1925/07/11 [id number]   Telephone Screen  Referral Date: 12/11/14 Referral Source: [facility] tier 4 list Referral Reason: DM,CHF, no ED visits and no hospital admits   Outreach attempt #1 to patient. No answer at present. RN CM left HIPAA compliant voicemail message along with contact info.  Plan: RN CM will attempt outreach call to patient within a week.  Enzo Montgomery, RN,BSN,CCM [place] Management Telephonic Care Management Coordinator Direct Phone: [phone] Toll Free: [phone] Fax: [phone]

## 2014-12-14 DIAGNOSIS — F0390 Unspecified dementia without behavioral disturbance: Secondary | ICD-10-CM | POA: Diagnosis not present

## 2014-12-15 DIAGNOSIS — F0390 Unspecified dementia without behavioral disturbance: Secondary | ICD-10-CM | POA: Diagnosis not present

## 2014-12-16 DIAGNOSIS — D509 Iron deficiency anemia, unspecified: Secondary | ICD-10-CM | POA: Diagnosis not present

## 2014-12-16 DIAGNOSIS — F0390 Unspecified dementia without behavioral disturbance: Secondary | ICD-10-CM | POA: Diagnosis not present

## 2014-12-16 DIAGNOSIS — D631 Anemia in chronic kidney disease: Secondary | ICD-10-CM | POA: Diagnosis not present

## 2014-12-16 DIAGNOSIS — N186 End stage renal disease: Secondary | ICD-10-CM | POA: Diagnosis not present

## 2014-12-16 DIAGNOSIS — Z992 Dependence on renal dialysis: Secondary | ICD-10-CM | POA: Diagnosis not present

## 2014-12-16 DIAGNOSIS — N2581 Secondary hyperparathyroidism of renal origin: Secondary | ICD-10-CM | POA: Diagnosis not present

## 2014-12-17 ENCOUNTER — Telehealth: Payer: Self-pay | Admitting: *Deleted

## 2014-12-17 ENCOUNTER — Encounter: Payer: Self-pay | Admitting: Family Medicine

## 2014-12-17 ENCOUNTER — Other Ambulatory Visit: Payer: Self-pay

## 2014-12-17 ENCOUNTER — Ambulatory Visit (INDEPENDENT_AMBULATORY_CARE_PROVIDER_SITE_OTHER): Payer: Commercial Managed Care - HMO | Admitting: Family Medicine

## 2014-12-17 VITALS — BP 136/74 | HR 82 | Temp 98.7°F | Resp 16 | Ht 63.0 in | Wt 181.0 lb

## 2014-12-17 DIAGNOSIS — R296 Repeated falls: Secondary | ICD-10-CM | POA: Diagnosis not present

## 2014-12-17 DIAGNOSIS — I5020 Unspecified systolic (congestive) heart failure: Secondary | ICD-10-CM | POA: Diagnosis not present

## 2014-12-17 DIAGNOSIS — I120 Hypertensive chronic kidney disease with stage 5 chronic kidney disease or end stage renal disease: Secondary | ICD-10-CM | POA: Diagnosis not present

## 2014-12-17 DIAGNOSIS — R2681 Unsteadiness on feet: Secondary | ICD-10-CM

## 2014-12-17 DIAGNOSIS — D631 Anemia in chronic kidney disease: Secondary | ICD-10-CM | POA: Diagnosis not present

## 2014-12-17 DIAGNOSIS — M150 Primary generalized (osteo)arthritis: Secondary | ICD-10-CM | POA: Diagnosis not present

## 2014-12-17 DIAGNOSIS — J9611 Chronic respiratory failure with hypoxia: Secondary | ICD-10-CM

## 2014-12-17 DIAGNOSIS — E1122 Type 2 diabetes mellitus with diabetic chronic kidney disease: Secondary | ICD-10-CM | POA: Diagnosis not present

## 2014-12-17 DIAGNOSIS — I5032 Chronic diastolic (congestive) heart failure: Secondary | ICD-10-CM | POA: Diagnosis not present

## 2014-12-17 DIAGNOSIS — M4000 Postural kyphosis, site unspecified: Secondary | ICD-10-CM | POA: Diagnosis not present

## 2014-12-17 DIAGNOSIS — N186 End stage renal disease: Secondary | ICD-10-CM | POA: Diagnosis not present

## 2014-12-17 DIAGNOSIS — F0390 Unspecified dementia without behavioral disturbance: Secondary | ICD-10-CM | POA: Diagnosis not present

## 2014-12-17 DIAGNOSIS — Z9181 History of falling: Secondary | ICD-10-CM

## 2014-12-17 DIAGNOSIS — S0990XD Unspecified injury of head, subsequent encounter: Secondary | ICD-10-CM | POA: Diagnosis not present

## 2014-12-17 DIAGNOSIS — F419 Anxiety disorder, unspecified: Secondary | ICD-10-CM | POA: Diagnosis not present

## 2014-12-17 NOTE — Progress Notes (Signed)
Patient ID: [name], female   DOB: 09-Oct-1925, 81 y.o.   MRN: [id number]   Subjective:    Patient ID: [name], female    DOB: Jun 04, 1925, 81 y.o.   MRN: [id number]  Patient presents for [facility] Face to Face    Patient here for face-to-face or physical therapy referral. She is currently in the assisted living facility. She's had some increased falls even if she is using her walker. We actually sent over orders for physical therapy orders a few weeks ago and she has artery had 2 visits. She also has a new rolling walker which she has not had any difficulties with. She states that she got up from the bed and lost her footing and fell and hit her head about a month ago she had a large bruise and hematoma in the middle for  forehead but she did not want to go the hospital she did not injure anything else. She's not had any falls since then. She denies any pain in any joints today. She states that she feels well. They also need a fill unit for her portable oxygen   Review Of Systems: per above   GEN- denies fatigue, fever, weight loss,weakness, recent illness HEENT- denies eye drainage, change in vision, nasal discharge, CVS- denies chest pain, palpitations RESP- denies SOB, cough, wheeze ABD- denies N/V, change in stools, abd pain GU- denies dysuria, hematuria, dribbling, incontinence MSK- + joint pain, muscle aches, injury Neuro- denies headache, dizziness, syncope, seizure activity       Objective:    BP 136/74 mmHg  Pulse 82  Temp(Src) 98.7 F (37.1 C) (Oral)  Resp 16  Ht 5\' 3"  (1.6 m)  Wt 181 lb (82.101 kg)  BMI 32.07 kg/m2  LMP 12/24/2010 GEN- NAD, alert and oriented x3 HEENT- PERRL, EOMI, non injected sclera, pink conjunctiva, MMM, oropharynx clear Neck- Supple, fair ROM, c spine NT CVS- RRR,3/6 SEM  RESP-CTAB ABD-NABS,soft,NT,ND Neuro-CNII-XII intact, no new deficits, slow unsteady gait without assistance, decreased tone upper and lower ext MSK- Decreased ROM  upper, lower ext, Spine EXT- No edema Pulses- Radial  2+       Assessment & Plan:      Problem List Items Addressed This Visit    Gait instability   Chronic respiratory failure (HCC)   Chronic diastolic congestive heart failure (HCC) (Chronic)    Currently compensated especially in the setting of dialysis. She does use oxygen for this and chronic respiratory failure as needed.      At high risk for falls - Primary    Multiple falls and high-risk for her age. She has a new rolling walker physical therapy will continue to work with her. In general she is doing fairly well at the assisted living facility.         Note: This dictation was prepared with Dragon dictation along with smaller phrase technology. Any transcriptional errors that result from this process are unintentional.

## 2014-12-17 NOTE — Assessment & Plan Note (Signed)
Multiple falls and high-risk for her age. She has a new rolling walker physical therapy will continue to work with her. In general she is doing fairly well at the assisted living facility.

## 2014-12-17 NOTE — Patient Instructions (Signed)
F/U as previous 

## 2014-12-17 NOTE — Assessment & Plan Note (Signed)
Currently compensated especially in the setting of dialysis. She does use oxygen for this and chronic respiratory failure as needed.

## 2014-12-17 NOTE — Telephone Encounter (Signed)
Please call ALF- [facility], who's oxygen is she using? Does she use at bedtime

## 2014-12-17 NOTE — Patient Outreach (Signed)
[facility]) Care Management  12/17/2014  [name] 07/16/1925 [id number]   Telephone Screen  Referral Date: 12/11/14 Referral Source: [facility] tier 4 list Referral Reason: DM,CHF, no ED visits and no hospital admits  Outreach attempt # 2 to patient.No answer at present. RN CM attempted secondary number listed for patient at [phone] with no success.     Plan: RN CM will attempt outreach call to patient within a week.  Enzo Montgomery, RN,BSN,CCM [place] Management Telephonic Care Management Coordinator Direct Phone: [phone] Toll Free: [phone] Fax: [phone]

## 2014-12-17 NOTE — Telephone Encounter (Signed)
Patient seen in office.   States that she has O2 concentrator and portable tanks, but facility has been filling tanks off of another patient's Home Fill Device. Orders for Home fill Device have been sent to [facility] and facility multiple times.   Call placed to facility and to [facility], [facility], to inquire. [place].

## 2014-12-17 NOTE — Telephone Encounter (Signed)
Call placed to facility.   Spoke with [name]. Was advised that [facility] reports that insurance is not going to cover O2 for PRN usage. Patient will need nocturnal usage dx or continuous usage dx.   As such, Home Fill device has not been sent to facility.   [name] requested order for O2 with specified times.   .MD please advise.

## 2014-12-18 DIAGNOSIS — Z992 Dependence on renal dialysis: Secondary | ICD-10-CM | POA: Diagnosis not present

## 2014-12-18 DIAGNOSIS — N2581 Secondary hyperparathyroidism of renal origin: Secondary | ICD-10-CM | POA: Diagnosis not present

## 2014-12-18 DIAGNOSIS — L84 Corns and callosities: Secondary | ICD-10-CM | POA: Diagnosis not present

## 2014-12-18 DIAGNOSIS — D631 Anemia in chronic kidney disease: Secondary | ICD-10-CM | POA: Diagnosis not present

## 2014-12-18 DIAGNOSIS — E1051 Type 1 diabetes mellitus with diabetic peripheral angiopathy without gangrene: Secondary | ICD-10-CM | POA: Diagnosis not present

## 2014-12-18 DIAGNOSIS — D509 Iron deficiency anemia, unspecified: Secondary | ICD-10-CM | POA: Diagnosis not present

## 2014-12-18 DIAGNOSIS — N186 End stage renal disease: Secondary | ICD-10-CM | POA: Diagnosis not present

## 2014-12-18 DIAGNOSIS — F0390 Unspecified dementia without behavioral disturbance: Secondary | ICD-10-CM | POA: Diagnosis not present

## 2014-12-18 NOTE — Telephone Encounter (Signed)
Order faxed to facility

## 2014-12-18 NOTE — Telephone Encounter (Signed)
Okay to do testing

## 2014-12-18 NOTE — Telephone Encounter (Signed)
Call placed to facility. Spoke with Lucinda.   States that patient does have order for PRN O2, but she is not currently getting O2 from supplier [facility]). States that she does have fillable tank that was being filled by roommates home fill device, but roommate has since moved out.   States that oxygen is placed on patient when patient requess, but no testing is done to see if O2 sats are in fact dropping.   No documentation noted to show that patient requires O2. Ok to order overnight pulse ox?

## 2014-12-19 DIAGNOSIS — M150 Primary generalized (osteo)arthritis: Secondary | ICD-10-CM | POA: Diagnosis not present

## 2014-12-19 DIAGNOSIS — F419 Anxiety disorder, unspecified: Secondary | ICD-10-CM | POA: Diagnosis not present

## 2014-12-19 DIAGNOSIS — S0990XD Unspecified injury of head, subsequent encounter: Secondary | ICD-10-CM | POA: Diagnosis not present

## 2014-12-19 DIAGNOSIS — N186 End stage renal disease: Secondary | ICD-10-CM | POA: Diagnosis not present

## 2014-12-19 DIAGNOSIS — F0390 Unspecified dementia without behavioral disturbance: Secondary | ICD-10-CM | POA: Diagnosis not present

## 2014-12-19 DIAGNOSIS — M4000 Postural kyphosis, site unspecified: Secondary | ICD-10-CM | POA: Diagnosis not present

## 2014-12-19 DIAGNOSIS — D631 Anemia in chronic kidney disease: Secondary | ICD-10-CM | POA: Diagnosis not present

## 2014-12-19 DIAGNOSIS — E1122 Type 2 diabetes mellitus with diabetic chronic kidney disease: Secondary | ICD-10-CM | POA: Diagnosis not present

## 2014-12-19 DIAGNOSIS — I120 Hypertensive chronic kidney disease with stage 5 chronic kidney disease or end stage renal disease: Secondary | ICD-10-CM | POA: Diagnosis not present

## 2014-12-19 DIAGNOSIS — I5020 Unspecified systolic (congestive) heart failure: Secondary | ICD-10-CM | POA: Diagnosis not present

## 2014-12-20 ENCOUNTER — Ambulatory Visit: Payer: Medicare HMO

## 2014-12-20 ENCOUNTER — Other Ambulatory Visit: Payer: Self-pay

## 2014-12-20 DIAGNOSIS — D509 Iron deficiency anemia, unspecified: Secondary | ICD-10-CM | POA: Diagnosis not present

## 2014-12-20 DIAGNOSIS — N186 End stage renal disease: Secondary | ICD-10-CM | POA: Diagnosis not present

## 2014-12-20 DIAGNOSIS — F0390 Unspecified dementia without behavioral disturbance: Secondary | ICD-10-CM | POA: Diagnosis not present

## 2014-12-20 DIAGNOSIS — D631 Anemia in chronic kidney disease: Secondary | ICD-10-CM | POA: Diagnosis not present

## 2014-12-20 DIAGNOSIS — N2581 Secondary hyperparathyroidism of renal origin: Secondary | ICD-10-CM | POA: Diagnosis not present

## 2014-12-20 DIAGNOSIS — Z992 Dependence on renal dialysis: Secondary | ICD-10-CM | POA: Diagnosis not present

## 2014-12-20 NOTE — Patient Outreach (Signed)
[place] [facility]) Care Management  12/20/2014  [name] 1925-02-09 [id number]   Telephone Screen  Referral Date: 12/11/14 Referral Source: [facility] tier 4 list Referral Reason: DM,CHF, no ED visits and no hospital admits   Outreach attempt # 3 to patient. No answer at both numbers listed for patient.   Plan: RN CM will send unsuccessful/unable to reach letter to patient. RN CM will close case if no response from patient within 10 days.  Enzo Montgomery, RN,BSN,CCM [place] Management Telephonic Care Management Coordinator Direct Phone: [phone] Toll Free: [phone] Fax: [phone]

## 2014-12-21 DIAGNOSIS — F0390 Unspecified dementia without behavioral disturbance: Secondary | ICD-10-CM | POA: Diagnosis not present

## 2014-12-22 DIAGNOSIS — F0390 Unspecified dementia without behavioral disturbance: Secondary | ICD-10-CM | POA: Diagnosis not present

## 2014-12-23 DIAGNOSIS — F0390 Unspecified dementia without behavioral disturbance: Secondary | ICD-10-CM | POA: Diagnosis not present

## 2014-12-23 DIAGNOSIS — N186 End stage renal disease: Secondary | ICD-10-CM | POA: Diagnosis not present

## 2014-12-23 DIAGNOSIS — D631 Anemia in chronic kidney disease: Secondary | ICD-10-CM | POA: Diagnosis not present

## 2014-12-23 DIAGNOSIS — N2581 Secondary hyperparathyroidism of renal origin: Secondary | ICD-10-CM | POA: Diagnosis not present

## 2014-12-23 DIAGNOSIS — D509 Iron deficiency anemia, unspecified: Secondary | ICD-10-CM | POA: Diagnosis not present

## 2014-12-23 DIAGNOSIS — Z992 Dependence on renal dialysis: Secondary | ICD-10-CM | POA: Diagnosis not present

## 2014-12-24 ENCOUNTER — Other Ambulatory Visit: Payer: Self-pay | Admitting: Family Medicine

## 2014-12-24 DIAGNOSIS — M150 Primary generalized (osteo)arthritis: Secondary | ICD-10-CM | POA: Diagnosis not present

## 2014-12-24 DIAGNOSIS — S0990XD Unspecified injury of head, subsequent encounter: Secondary | ICD-10-CM | POA: Diagnosis not present

## 2014-12-24 DIAGNOSIS — D631 Anemia in chronic kidney disease: Secondary | ICD-10-CM | POA: Diagnosis not present

## 2014-12-24 DIAGNOSIS — I120 Hypertensive chronic kidney disease with stage 5 chronic kidney disease or end stage renal disease: Secondary | ICD-10-CM | POA: Diagnosis not present

## 2014-12-24 DIAGNOSIS — E1122 Type 2 diabetes mellitus with diabetic chronic kidney disease: Secondary | ICD-10-CM | POA: Diagnosis not present

## 2014-12-24 DIAGNOSIS — F419 Anxiety disorder, unspecified: Secondary | ICD-10-CM | POA: Diagnosis not present

## 2014-12-24 DIAGNOSIS — M4000 Postural kyphosis, site unspecified: Secondary | ICD-10-CM | POA: Diagnosis not present

## 2014-12-24 DIAGNOSIS — F0390 Unspecified dementia without behavioral disturbance: Secondary | ICD-10-CM | POA: Diagnosis not present

## 2014-12-24 DIAGNOSIS — I5020 Unspecified systolic (congestive) heart failure: Secondary | ICD-10-CM | POA: Diagnosis not present

## 2014-12-24 DIAGNOSIS — N186 End stage renal disease: Secondary | ICD-10-CM | POA: Diagnosis not present

## 2014-12-24 MED ORDER — TRAMADOL HCL 50 MG PO TABS: 50.0000 mg | ORAL_TABLET | Freq: Two times a day (BID) | ORAL | Status: DC | PRN

## 2014-12-24 NOTE — Telephone Encounter (Signed)
rx printed and faxed to pharmacy

## 2014-12-24 NOTE — Telephone Encounter (Signed)
okay

## 2014-12-24 NOTE — Telephone Encounter (Signed)
Need to fax order to [facility] fax # [phone].    OK refill?

## 2014-12-25 DIAGNOSIS — D631 Anemia in chronic kidney disease: Secondary | ICD-10-CM | POA: Diagnosis not present

## 2014-12-25 DIAGNOSIS — N2581 Secondary hyperparathyroidism of renal origin: Secondary | ICD-10-CM | POA: Diagnosis not present

## 2014-12-25 DIAGNOSIS — Z992 Dependence on renal dialysis: Secondary | ICD-10-CM | POA: Diagnosis not present

## 2014-12-25 DIAGNOSIS — N186 End stage renal disease: Secondary | ICD-10-CM | POA: Diagnosis not present

## 2014-12-25 DIAGNOSIS — F0390 Unspecified dementia without behavioral disturbance: Secondary | ICD-10-CM | POA: Diagnosis not present

## 2014-12-25 DIAGNOSIS — D509 Iron deficiency anemia, unspecified: Secondary | ICD-10-CM | POA: Diagnosis not present

## 2014-12-26 DIAGNOSIS — F0390 Unspecified dementia without behavioral disturbance: Secondary | ICD-10-CM | POA: Diagnosis not present

## 2014-12-26 DIAGNOSIS — D631 Anemia in chronic kidney disease: Secondary | ICD-10-CM | POA: Diagnosis not present

## 2014-12-26 DIAGNOSIS — M150 Primary generalized (osteo)arthritis: Secondary | ICD-10-CM | POA: Diagnosis not present

## 2014-12-26 DIAGNOSIS — I5020 Unspecified systolic (congestive) heart failure: Secondary | ICD-10-CM | POA: Diagnosis not present

## 2014-12-26 DIAGNOSIS — M4000 Postural kyphosis, site unspecified: Secondary | ICD-10-CM | POA: Diagnosis not present

## 2014-12-26 DIAGNOSIS — I120 Hypertensive chronic kidney disease with stage 5 chronic kidney disease or end stage renal disease: Secondary | ICD-10-CM | POA: Diagnosis not present

## 2014-12-26 DIAGNOSIS — N186 End stage renal disease: Secondary | ICD-10-CM | POA: Diagnosis not present

## 2014-12-26 DIAGNOSIS — E1122 Type 2 diabetes mellitus with diabetic chronic kidney disease: Secondary | ICD-10-CM | POA: Diagnosis not present

## 2014-12-26 DIAGNOSIS — F419 Anxiety disorder, unspecified: Secondary | ICD-10-CM | POA: Diagnosis not present

## 2014-12-26 DIAGNOSIS — S0990XD Unspecified injury of head, subsequent encounter: Secondary | ICD-10-CM | POA: Diagnosis not present

## 2014-12-27 DIAGNOSIS — D631 Anemia in chronic kidney disease: Secondary | ICD-10-CM | POA: Diagnosis not present

## 2014-12-27 DIAGNOSIS — D509 Iron deficiency anemia, unspecified: Secondary | ICD-10-CM | POA: Diagnosis not present

## 2014-12-27 DIAGNOSIS — F0390 Unspecified dementia without behavioral disturbance: Secondary | ICD-10-CM | POA: Diagnosis not present

## 2014-12-27 DIAGNOSIS — N186 End stage renal disease: Secondary | ICD-10-CM | POA: Diagnosis not present

## 2014-12-27 DIAGNOSIS — Z992 Dependence on renal dialysis: Secondary | ICD-10-CM | POA: Diagnosis not present

## 2014-12-27 DIAGNOSIS — N2581 Secondary hyperparathyroidism of renal origin: Secondary | ICD-10-CM | POA: Diagnosis not present

## 2014-12-28 DIAGNOSIS — F0390 Unspecified dementia without behavioral disturbance: Secondary | ICD-10-CM | POA: Diagnosis not present

## 2014-12-29 DIAGNOSIS — F0390 Unspecified dementia without behavioral disturbance: Secondary | ICD-10-CM | POA: Diagnosis not present

## 2014-12-30 DIAGNOSIS — F0390 Unspecified dementia without behavioral disturbance: Secondary | ICD-10-CM | POA: Diagnosis not present

## 2014-12-30 DIAGNOSIS — D631 Anemia in chronic kidney disease: Secondary | ICD-10-CM | POA: Diagnosis not present

## 2014-12-30 DIAGNOSIS — D509 Iron deficiency anemia, unspecified: Secondary | ICD-10-CM | POA: Diagnosis not present

## 2014-12-30 DIAGNOSIS — N186 End stage renal disease: Secondary | ICD-10-CM | POA: Diagnosis not present

## 2014-12-30 DIAGNOSIS — N2581 Secondary hyperparathyroidism of renal origin: Secondary | ICD-10-CM | POA: Diagnosis not present

## 2014-12-30 DIAGNOSIS — Z992 Dependence on renal dialysis: Secondary | ICD-10-CM | POA: Diagnosis not present

## 2014-12-30 DIAGNOSIS — E1122 Type 2 diabetes mellitus with diabetic chronic kidney disease: Secondary | ICD-10-CM | POA: Diagnosis not present

## 2014-12-31 DIAGNOSIS — D631 Anemia in chronic kidney disease: Secondary | ICD-10-CM | POA: Diagnosis not present

## 2014-12-31 DIAGNOSIS — E1122 Type 2 diabetes mellitus with diabetic chronic kidney disease: Secondary | ICD-10-CM | POA: Diagnosis not present

## 2014-12-31 DIAGNOSIS — F419 Anxiety disorder, unspecified: Secondary | ICD-10-CM | POA: Diagnosis not present

## 2014-12-31 DIAGNOSIS — M4000 Postural kyphosis, site unspecified: Secondary | ICD-10-CM | POA: Diagnosis not present

## 2014-12-31 DIAGNOSIS — I120 Hypertensive chronic kidney disease with stage 5 chronic kidney disease or end stage renal disease: Secondary | ICD-10-CM | POA: Diagnosis not present

## 2014-12-31 DIAGNOSIS — N186 End stage renal disease: Secondary | ICD-10-CM | POA: Diagnosis not present

## 2014-12-31 DIAGNOSIS — M150 Primary generalized (osteo)arthritis: Secondary | ICD-10-CM | POA: Diagnosis not present

## 2014-12-31 DIAGNOSIS — I5020 Unspecified systolic (congestive) heart failure: Secondary | ICD-10-CM | POA: Diagnosis not present

## 2014-12-31 DIAGNOSIS — F0390 Unspecified dementia without behavioral disturbance: Secondary | ICD-10-CM | POA: Diagnosis not present

## 2014-12-31 DIAGNOSIS — S0990XD Unspecified injury of head, subsequent encounter: Secondary | ICD-10-CM | POA: Diagnosis not present

## 2015-01-01 DIAGNOSIS — D509 Iron deficiency anemia, unspecified: Secondary | ICD-10-CM | POA: Diagnosis not present

## 2015-01-01 DIAGNOSIS — N186 End stage renal disease: Secondary | ICD-10-CM | POA: Diagnosis not present

## 2015-01-01 DIAGNOSIS — D631 Anemia in chronic kidney disease: Secondary | ICD-10-CM | POA: Diagnosis not present

## 2015-01-01 DIAGNOSIS — Z992 Dependence on renal dialysis: Secondary | ICD-10-CM | POA: Diagnosis not present

## 2015-01-01 DIAGNOSIS — N2581 Secondary hyperparathyroidism of renal origin: Secondary | ICD-10-CM | POA: Diagnosis not present

## 2015-01-01 DIAGNOSIS — F0390 Unspecified dementia without behavioral disturbance: Secondary | ICD-10-CM | POA: Diagnosis not present

## 2015-01-02 DIAGNOSIS — M150 Primary generalized (osteo)arthritis: Secondary | ICD-10-CM | POA: Diagnosis not present

## 2015-01-02 DIAGNOSIS — F0390 Unspecified dementia without behavioral disturbance: Secondary | ICD-10-CM | POA: Diagnosis not present

## 2015-01-02 DIAGNOSIS — I5020 Unspecified systolic (congestive) heart failure: Secondary | ICD-10-CM | POA: Diagnosis not present

## 2015-01-02 DIAGNOSIS — S0990XD Unspecified injury of head, subsequent encounter: Secondary | ICD-10-CM | POA: Diagnosis not present

## 2015-01-02 DIAGNOSIS — F419 Anxiety disorder, unspecified: Secondary | ICD-10-CM | POA: Diagnosis not present

## 2015-01-02 DIAGNOSIS — M4000 Postural kyphosis, site unspecified: Secondary | ICD-10-CM | POA: Diagnosis not present

## 2015-01-02 DIAGNOSIS — N186 End stage renal disease: Secondary | ICD-10-CM | POA: Diagnosis not present

## 2015-01-02 DIAGNOSIS — I120 Hypertensive chronic kidney disease with stage 5 chronic kidney disease or end stage renal disease: Secondary | ICD-10-CM | POA: Diagnosis not present

## 2015-01-02 DIAGNOSIS — D631 Anemia in chronic kidney disease: Secondary | ICD-10-CM | POA: Diagnosis not present

## 2015-01-02 DIAGNOSIS — E1122 Type 2 diabetes mellitus with diabetic chronic kidney disease: Secondary | ICD-10-CM | POA: Diagnosis not present

## 2015-01-03 DIAGNOSIS — F0390 Unspecified dementia without behavioral disturbance: Secondary | ICD-10-CM | POA: Diagnosis not present

## 2015-01-04 DIAGNOSIS — F0390 Unspecified dementia without behavioral disturbance: Secondary | ICD-10-CM | POA: Diagnosis not present

## 2015-01-04 DIAGNOSIS — Z992 Dependence on renal dialysis: Secondary | ICD-10-CM | POA: Diagnosis not present

## 2015-01-04 DIAGNOSIS — D631 Anemia in chronic kidney disease: Secondary | ICD-10-CM | POA: Diagnosis not present

## 2015-01-04 DIAGNOSIS — N2581 Secondary hyperparathyroidism of renal origin: Secondary | ICD-10-CM | POA: Diagnosis not present

## 2015-01-04 DIAGNOSIS — N186 End stage renal disease: Secondary | ICD-10-CM | POA: Diagnosis not present

## 2015-01-04 DIAGNOSIS — D509 Iron deficiency anemia, unspecified: Secondary | ICD-10-CM | POA: Diagnosis not present

## 2015-01-05 DIAGNOSIS — F0390 Unspecified dementia without behavioral disturbance: Secondary | ICD-10-CM | POA: Diagnosis not present

## 2015-01-06 DIAGNOSIS — N2581 Secondary hyperparathyroidism of renal origin: Secondary | ICD-10-CM | POA: Diagnosis not present

## 2015-01-06 DIAGNOSIS — N186 End stage renal disease: Secondary | ICD-10-CM | POA: Diagnosis not present

## 2015-01-06 DIAGNOSIS — F0390 Unspecified dementia without behavioral disturbance: Secondary | ICD-10-CM | POA: Diagnosis not present

## 2015-01-06 DIAGNOSIS — D509 Iron deficiency anemia, unspecified: Secondary | ICD-10-CM | POA: Diagnosis not present

## 2015-01-06 DIAGNOSIS — D631 Anemia in chronic kidney disease: Secondary | ICD-10-CM | POA: Diagnosis not present

## 2015-01-06 DIAGNOSIS — Z992 Dependence on renal dialysis: Secondary | ICD-10-CM | POA: Diagnosis not present

## 2015-01-07 ENCOUNTER — Other Ambulatory Visit: Payer: Self-pay

## 2015-01-07 DIAGNOSIS — F0390 Unspecified dementia without behavioral disturbance: Secondary | ICD-10-CM | POA: Diagnosis not present

## 2015-01-07 NOTE — Patient Outreach (Signed)
[facility]) Care Management  01/07/2015  [name] 1925-10-27 [id number]   Telephone Screen  Referral Date: 12/11/14 Referral Source: [facility] tier 4 list Referral Reason: DM,CHF, no ED visits and no hospital admits  Multiple attempts to reach patient to discuss and offer [facility] services. No response from letter mailed to patient. Case is being closed at this time.   Plan: RN CM will notify [facility] administrative assistant of case closure. RN CM will send MD case closure letter.  Enzo Montgomery, RN,BSN,CCM [place] Management Telephonic Care Management Coordinator Direct Phone: [phone] Toll Free: [phone] Fax: [phone]

## 2015-01-08 DIAGNOSIS — D631 Anemia in chronic kidney disease: Secondary | ICD-10-CM | POA: Diagnosis not present

## 2015-01-08 DIAGNOSIS — N186 End stage renal disease: Secondary | ICD-10-CM | POA: Diagnosis not present

## 2015-01-08 DIAGNOSIS — Z992 Dependence on renal dialysis: Secondary | ICD-10-CM | POA: Diagnosis not present

## 2015-01-08 DIAGNOSIS — F0390 Unspecified dementia without behavioral disturbance: Secondary | ICD-10-CM | POA: Diagnosis not present

## 2015-01-08 DIAGNOSIS — D509 Iron deficiency anemia, unspecified: Secondary | ICD-10-CM | POA: Diagnosis not present

## 2015-01-08 DIAGNOSIS — N2581 Secondary hyperparathyroidism of renal origin: Secondary | ICD-10-CM | POA: Diagnosis not present

## 2015-01-09 DIAGNOSIS — S0990XD Unspecified injury of head, subsequent encounter: Secondary | ICD-10-CM | POA: Diagnosis not present

## 2015-01-09 DIAGNOSIS — M4000 Postural kyphosis, site unspecified: Secondary | ICD-10-CM | POA: Diagnosis not present

## 2015-01-09 DIAGNOSIS — I5020 Unspecified systolic (congestive) heart failure: Secondary | ICD-10-CM | POA: Diagnosis not present

## 2015-01-09 DIAGNOSIS — M150 Primary generalized (osteo)arthritis: Secondary | ICD-10-CM | POA: Diagnosis not present

## 2015-01-09 DIAGNOSIS — N186 End stage renal disease: Secondary | ICD-10-CM | POA: Diagnosis not present

## 2015-01-09 DIAGNOSIS — I120 Hypertensive chronic kidney disease with stage 5 chronic kidney disease or end stage renal disease: Secondary | ICD-10-CM | POA: Diagnosis not present

## 2015-01-09 DIAGNOSIS — D631 Anemia in chronic kidney disease: Secondary | ICD-10-CM | POA: Diagnosis not present

## 2015-01-09 DIAGNOSIS — F419 Anxiety disorder, unspecified: Secondary | ICD-10-CM | POA: Diagnosis not present

## 2015-01-09 DIAGNOSIS — E1122 Type 2 diabetes mellitus with diabetic chronic kidney disease: Secondary | ICD-10-CM | POA: Diagnosis not present

## 2015-01-09 DIAGNOSIS — F0390 Unspecified dementia without behavioral disturbance: Secondary | ICD-10-CM | POA: Diagnosis not present

## 2015-01-10 DIAGNOSIS — Z992 Dependence on renal dialysis: Secondary | ICD-10-CM | POA: Diagnosis not present

## 2015-01-10 DIAGNOSIS — F0390 Unspecified dementia without behavioral disturbance: Secondary | ICD-10-CM | POA: Diagnosis not present

## 2015-01-10 DIAGNOSIS — N186 End stage renal disease: Secondary | ICD-10-CM | POA: Diagnosis not present

## 2015-01-10 DIAGNOSIS — D509 Iron deficiency anemia, unspecified: Secondary | ICD-10-CM | POA: Diagnosis not present

## 2015-01-10 DIAGNOSIS — N2581 Secondary hyperparathyroidism of renal origin: Secondary | ICD-10-CM | POA: Diagnosis not present

## 2015-01-10 DIAGNOSIS — D631 Anemia in chronic kidney disease: Secondary | ICD-10-CM | POA: Diagnosis not present

## 2015-01-11 DIAGNOSIS — S0990XD Unspecified injury of head, subsequent encounter: Secondary | ICD-10-CM | POA: Diagnosis not present

## 2015-01-11 DIAGNOSIS — M4000 Postural kyphosis, site unspecified: Secondary | ICD-10-CM | POA: Diagnosis not present

## 2015-01-11 DIAGNOSIS — F419 Anxiety disorder, unspecified: Secondary | ICD-10-CM | POA: Diagnosis not present

## 2015-01-11 DIAGNOSIS — D631 Anemia in chronic kidney disease: Secondary | ICD-10-CM | POA: Diagnosis not present

## 2015-01-11 DIAGNOSIS — Z992 Dependence on renal dialysis: Secondary | ICD-10-CM | POA: Diagnosis not present

## 2015-01-11 DIAGNOSIS — I120 Hypertensive chronic kidney disease with stage 5 chronic kidney disease or end stage renal disease: Secondary | ICD-10-CM | POA: Diagnosis not present

## 2015-01-11 DIAGNOSIS — N186 End stage renal disease: Secondary | ICD-10-CM | POA: Diagnosis not present

## 2015-01-11 DIAGNOSIS — F0390 Unspecified dementia without behavioral disturbance: Secondary | ICD-10-CM | POA: Diagnosis not present

## 2015-01-11 DIAGNOSIS — I5020 Unspecified systolic (congestive) heart failure: Secondary | ICD-10-CM | POA: Diagnosis not present

## 2015-01-11 DIAGNOSIS — M150 Primary generalized (osteo)arthritis: Secondary | ICD-10-CM | POA: Diagnosis not present

## 2015-01-11 DIAGNOSIS — E1122 Type 2 diabetes mellitus with diabetic chronic kidney disease: Secondary | ICD-10-CM | POA: Diagnosis not present

## 2015-01-12 DIAGNOSIS — F0390 Unspecified dementia without behavioral disturbance: Secondary | ICD-10-CM | POA: Diagnosis not present

## 2015-01-13 DIAGNOSIS — N186 End stage renal disease: Secondary | ICD-10-CM | POA: Diagnosis not present

## 2015-01-13 DIAGNOSIS — Z992 Dependence on renal dialysis: Secondary | ICD-10-CM | POA: Diagnosis not present

## 2015-01-13 DIAGNOSIS — F0390 Unspecified dementia without behavioral disturbance: Secondary | ICD-10-CM | POA: Diagnosis not present

## 2015-01-14 DIAGNOSIS — E1122 Type 2 diabetes mellitus with diabetic chronic kidney disease: Secondary | ICD-10-CM | POA: Diagnosis not present

## 2015-01-14 DIAGNOSIS — M150 Primary generalized (osteo)arthritis: Secondary | ICD-10-CM | POA: Diagnosis not present

## 2015-01-14 DIAGNOSIS — D631 Anemia in chronic kidney disease: Secondary | ICD-10-CM | POA: Diagnosis not present

## 2015-01-14 DIAGNOSIS — I120 Hypertensive chronic kidney disease with stage 5 chronic kidney disease or end stage renal disease: Secondary | ICD-10-CM | POA: Diagnosis not present

## 2015-01-14 DIAGNOSIS — F419 Anxiety disorder, unspecified: Secondary | ICD-10-CM | POA: Diagnosis not present

## 2015-01-14 DIAGNOSIS — M4000 Postural kyphosis, site unspecified: Secondary | ICD-10-CM | POA: Diagnosis not present

## 2015-01-14 DIAGNOSIS — F0390 Unspecified dementia without behavioral disturbance: Secondary | ICD-10-CM | POA: Diagnosis not present

## 2015-01-14 DIAGNOSIS — I5020 Unspecified systolic (congestive) heart failure: Secondary | ICD-10-CM | POA: Diagnosis not present

## 2015-01-14 DIAGNOSIS — N186 End stage renal disease: Secondary | ICD-10-CM | POA: Diagnosis not present

## 2015-01-14 DIAGNOSIS — S0990XD Unspecified injury of head, subsequent encounter: Secondary | ICD-10-CM | POA: Diagnosis not present

## 2015-01-15 DIAGNOSIS — N186 End stage renal disease: Secondary | ICD-10-CM | POA: Diagnosis not present

## 2015-01-15 DIAGNOSIS — Z992 Dependence on renal dialysis: Secondary | ICD-10-CM | POA: Diagnosis not present

## 2015-01-15 DIAGNOSIS — F0390 Unspecified dementia without behavioral disturbance: Secondary | ICD-10-CM | POA: Diagnosis not present

## 2015-01-16 DIAGNOSIS — N186 End stage renal disease: Secondary | ICD-10-CM | POA: Diagnosis not present

## 2015-01-16 DIAGNOSIS — M4000 Postural kyphosis, site unspecified: Secondary | ICD-10-CM | POA: Diagnosis not present

## 2015-01-16 DIAGNOSIS — I5020 Unspecified systolic (congestive) heart failure: Secondary | ICD-10-CM | POA: Diagnosis not present

## 2015-01-16 DIAGNOSIS — D631 Anemia in chronic kidney disease: Secondary | ICD-10-CM | POA: Diagnosis not present

## 2015-01-16 DIAGNOSIS — S0990XD Unspecified injury of head, subsequent encounter: Secondary | ICD-10-CM | POA: Diagnosis not present

## 2015-01-16 DIAGNOSIS — F419 Anxiety disorder, unspecified: Secondary | ICD-10-CM | POA: Diagnosis not present

## 2015-01-16 DIAGNOSIS — I120 Hypertensive chronic kidney disease with stage 5 chronic kidney disease or end stage renal disease: Secondary | ICD-10-CM | POA: Diagnosis not present

## 2015-01-16 DIAGNOSIS — M150 Primary generalized (osteo)arthritis: Secondary | ICD-10-CM | POA: Diagnosis not present

## 2015-01-16 DIAGNOSIS — E1122 Type 2 diabetes mellitus with diabetic chronic kidney disease: Secondary | ICD-10-CM | POA: Diagnosis not present

## 2015-01-16 DIAGNOSIS — F0390 Unspecified dementia without behavioral disturbance: Secondary | ICD-10-CM | POA: Diagnosis not present

## 2015-01-17 DIAGNOSIS — N186 End stage renal disease: Secondary | ICD-10-CM | POA: Diagnosis not present

## 2015-01-17 DIAGNOSIS — F0390 Unspecified dementia without behavioral disturbance: Secondary | ICD-10-CM | POA: Diagnosis not present

## 2015-01-17 DIAGNOSIS — Z992 Dependence on renal dialysis: Secondary | ICD-10-CM | POA: Diagnosis not present

## 2015-01-18 DIAGNOSIS — F0390 Unspecified dementia without behavioral disturbance: Secondary | ICD-10-CM | POA: Diagnosis not present

## 2015-01-19 DIAGNOSIS — F0390 Unspecified dementia without behavioral disturbance: Secondary | ICD-10-CM | POA: Diagnosis not present

## 2015-01-20 DIAGNOSIS — F0390 Unspecified dementia without behavioral disturbance: Secondary | ICD-10-CM | POA: Diagnosis not present

## 2015-01-21 DIAGNOSIS — H35371 Puckering of macula, right eye: Secondary | ICD-10-CM | POA: Diagnosis not present

## 2015-01-21 DIAGNOSIS — E113393 Type 2 diabetes mellitus with moderate nonproliferative diabetic retinopathy without macular edema, bilateral: Secondary | ICD-10-CM | POA: Diagnosis not present

## 2015-01-21 DIAGNOSIS — H34812 Central retinal vein occlusion, left eye, with macular edema: Secondary | ICD-10-CM | POA: Diagnosis not present

## 2015-01-21 DIAGNOSIS — F0390 Unspecified dementia without behavioral disturbance: Secondary | ICD-10-CM | POA: Diagnosis not present

## 2015-01-21 DIAGNOSIS — H43813 Vitreous degeneration, bilateral: Secondary | ICD-10-CM | POA: Diagnosis not present

## 2015-01-22 ENCOUNTER — Telehealth: Payer: Self-pay | Admitting: *Deleted

## 2015-01-22 DIAGNOSIS — E1122 Type 2 diabetes mellitus with diabetic chronic kidney disease: Secondary | ICD-10-CM | POA: Diagnosis not present

## 2015-01-22 DIAGNOSIS — Z992 Dependence on renal dialysis: Secondary | ICD-10-CM | POA: Diagnosis not present

## 2015-01-22 DIAGNOSIS — M150 Primary generalized (osteo)arthritis: Secondary | ICD-10-CM | POA: Diagnosis not present

## 2015-01-22 DIAGNOSIS — N186 End stage renal disease: Secondary | ICD-10-CM | POA: Diagnosis not present

## 2015-01-22 DIAGNOSIS — D631 Anemia in chronic kidney disease: Secondary | ICD-10-CM | POA: Diagnosis not present

## 2015-01-22 DIAGNOSIS — F0390 Unspecified dementia without behavioral disturbance: Secondary | ICD-10-CM | POA: Diagnosis not present

## 2015-01-22 DIAGNOSIS — I120 Hypertensive chronic kidney disease with stage 5 chronic kidney disease or end stage renal disease: Secondary | ICD-10-CM | POA: Diagnosis not present

## 2015-01-22 DIAGNOSIS — M4000 Postural kyphosis, site unspecified: Secondary | ICD-10-CM | POA: Diagnosis not present

## 2015-01-22 DIAGNOSIS — F419 Anxiety disorder, unspecified: Secondary | ICD-10-CM | POA: Diagnosis not present

## 2015-01-22 DIAGNOSIS — I5020 Unspecified systolic (congestive) heart failure: Secondary | ICD-10-CM | POA: Diagnosis not present

## 2015-01-22 DIAGNOSIS — S0990XD Unspecified injury of head, subsequent encounter: Secondary | ICD-10-CM | POA: Diagnosis not present

## 2015-01-22 NOTE — Telephone Encounter (Signed)
Submitted humana referral thru acuity connect for authorization on 01/17/15 to Dr Sherlynn Stalls, MD with authorization [id number]  Requesting provider: Neysa Hotter  Treating provider: Deeann Cree  Number of visits: 4  Start Date: 01/21/2015  End Date: 07/20/2015  Dx: [id number]- central retinal vision occulsion, left eye, with macular edema       H34.8111- central retinal vision occulsion, right eye, with rtnl neovas       E 11.3291- type 2 diab with mild nonp rtnop without mclr edema,r eye

## 2015-01-23 DIAGNOSIS — I5020 Unspecified systolic (congestive) heart failure: Secondary | ICD-10-CM | POA: Diagnosis not present

## 2015-01-23 DIAGNOSIS — E1122 Type 2 diabetes mellitus with diabetic chronic kidney disease: Secondary | ICD-10-CM | POA: Diagnosis not present

## 2015-01-23 DIAGNOSIS — I120 Hypertensive chronic kidney disease with stage 5 chronic kidney disease or end stage renal disease: Secondary | ICD-10-CM | POA: Diagnosis not present

## 2015-01-23 DIAGNOSIS — F419 Anxiety disorder, unspecified: Secondary | ICD-10-CM | POA: Diagnosis not present

## 2015-01-23 DIAGNOSIS — D631 Anemia in chronic kidney disease: Secondary | ICD-10-CM | POA: Diagnosis not present

## 2015-01-23 DIAGNOSIS — M150 Primary generalized (osteo)arthritis: Secondary | ICD-10-CM | POA: Diagnosis not present

## 2015-01-23 DIAGNOSIS — M4000 Postural kyphosis, site unspecified: Secondary | ICD-10-CM | POA: Diagnosis not present

## 2015-01-23 DIAGNOSIS — F0390 Unspecified dementia without behavioral disturbance: Secondary | ICD-10-CM | POA: Diagnosis not present

## 2015-01-23 DIAGNOSIS — S0990XD Unspecified injury of head, subsequent encounter: Secondary | ICD-10-CM | POA: Diagnosis not present

## 2015-01-23 DIAGNOSIS — N186 End stage renal disease: Secondary | ICD-10-CM | POA: Diagnosis not present

## 2015-01-24 DIAGNOSIS — Z992 Dependence on renal dialysis: Secondary | ICD-10-CM | POA: Diagnosis not present

## 2015-01-24 DIAGNOSIS — F0390 Unspecified dementia without behavioral disturbance: Secondary | ICD-10-CM | POA: Diagnosis not present

## 2015-01-24 DIAGNOSIS — N186 End stage renal disease: Secondary | ICD-10-CM | POA: Diagnosis not present

## 2015-01-25 DIAGNOSIS — F0390 Unspecified dementia without behavioral disturbance: Secondary | ICD-10-CM | POA: Diagnosis not present

## 2015-01-26 DIAGNOSIS — F0390 Unspecified dementia without behavioral disturbance: Secondary | ICD-10-CM | POA: Diagnosis not present

## 2015-01-27 DIAGNOSIS — Z992 Dependence on renal dialysis: Secondary | ICD-10-CM | POA: Diagnosis not present

## 2015-01-27 DIAGNOSIS — N186 End stage renal disease: Secondary | ICD-10-CM | POA: Diagnosis not present

## 2015-01-27 DIAGNOSIS — F0390 Unspecified dementia without behavioral disturbance: Secondary | ICD-10-CM | POA: Diagnosis not present

## 2015-01-28 DIAGNOSIS — F0390 Unspecified dementia without behavioral disturbance: Secondary | ICD-10-CM | POA: Diagnosis not present

## 2015-01-29 DIAGNOSIS — N186 End stage renal disease: Secondary | ICD-10-CM | POA: Diagnosis not present

## 2015-01-29 DIAGNOSIS — F0390 Unspecified dementia without behavioral disturbance: Secondary | ICD-10-CM | POA: Diagnosis not present

## 2015-01-29 DIAGNOSIS — Z992 Dependence on renal dialysis: Secondary | ICD-10-CM | POA: Diagnosis not present

## 2015-01-30 DIAGNOSIS — F0390 Unspecified dementia without behavioral disturbance: Secondary | ICD-10-CM | POA: Diagnosis not present

## 2015-01-31 DIAGNOSIS — F0390 Unspecified dementia without behavioral disturbance: Secondary | ICD-10-CM | POA: Diagnosis not present

## 2015-01-31 DIAGNOSIS — N186 End stage renal disease: Secondary | ICD-10-CM | POA: Diagnosis not present

## 2015-01-31 DIAGNOSIS — Z992 Dependence on renal dialysis: Secondary | ICD-10-CM | POA: Diagnosis not present

## 2015-02-01 DIAGNOSIS — F0390 Unspecified dementia without behavioral disturbance: Secondary | ICD-10-CM | POA: Diagnosis not present

## 2015-02-02 DIAGNOSIS — F0390 Unspecified dementia without behavioral disturbance: Secondary | ICD-10-CM | POA: Diagnosis not present

## 2015-02-03 DIAGNOSIS — N186 End stage renal disease: Secondary | ICD-10-CM | POA: Diagnosis not present

## 2015-02-03 DIAGNOSIS — F0390 Unspecified dementia without behavioral disturbance: Secondary | ICD-10-CM | POA: Diagnosis not present

## 2015-02-03 DIAGNOSIS — Z992 Dependence on renal dialysis: Secondary | ICD-10-CM | POA: Diagnosis not present

## 2015-02-04 DIAGNOSIS — F0390 Unspecified dementia without behavioral disturbance: Secondary | ICD-10-CM | POA: Diagnosis not present

## 2015-02-05 DIAGNOSIS — N186 End stage renal disease: Secondary | ICD-10-CM | POA: Diagnosis not present

## 2015-02-05 DIAGNOSIS — F0390 Unspecified dementia without behavioral disturbance: Secondary | ICD-10-CM | POA: Diagnosis not present

## 2015-02-05 DIAGNOSIS — Z992 Dependence on renal dialysis: Secondary | ICD-10-CM | POA: Diagnosis not present

## 2015-02-06 DIAGNOSIS — F0390 Unspecified dementia without behavioral disturbance: Secondary | ICD-10-CM | POA: Diagnosis not present

## 2015-02-07 DIAGNOSIS — F0390 Unspecified dementia without behavioral disturbance: Secondary | ICD-10-CM | POA: Diagnosis not present

## 2015-02-07 DIAGNOSIS — N186 End stage renal disease: Secondary | ICD-10-CM | POA: Diagnosis not present

## 2015-02-07 DIAGNOSIS — Z992 Dependence on renal dialysis: Secondary | ICD-10-CM | POA: Diagnosis not present

## 2015-02-08 DIAGNOSIS — F0390 Unspecified dementia without behavioral disturbance: Secondary | ICD-10-CM | POA: Diagnosis not present

## 2015-02-09 DIAGNOSIS — F0390 Unspecified dementia without behavioral disturbance: Secondary | ICD-10-CM | POA: Diagnosis not present

## 2015-02-10 DIAGNOSIS — N186 End stage renal disease: Secondary | ICD-10-CM | POA: Diagnosis not present

## 2015-02-10 DIAGNOSIS — Z992 Dependence on renal dialysis: Secondary | ICD-10-CM | POA: Diagnosis not present

## 2015-02-10 DIAGNOSIS — F0390 Unspecified dementia without behavioral disturbance: Secondary | ICD-10-CM | POA: Diagnosis not present

## 2015-02-11 DIAGNOSIS — Z992 Dependence on renal dialysis: Secondary | ICD-10-CM | POA: Diagnosis not present

## 2015-02-11 DIAGNOSIS — N186 End stage renal disease: Secondary | ICD-10-CM | POA: Diagnosis not present

## 2015-02-11 DIAGNOSIS — F0390 Unspecified dementia without behavioral disturbance: Secondary | ICD-10-CM | POA: Diagnosis not present

## 2015-02-12 DIAGNOSIS — N186 End stage renal disease: Secondary | ICD-10-CM | POA: Diagnosis not present

## 2015-02-12 DIAGNOSIS — D631 Anemia in chronic kidney disease: Secondary | ICD-10-CM | POA: Diagnosis not present

## 2015-02-12 DIAGNOSIS — N2581 Secondary hyperparathyroidism of renal origin: Secondary | ICD-10-CM | POA: Diagnosis not present

## 2015-02-12 DIAGNOSIS — Z992 Dependence on renal dialysis: Secondary | ICD-10-CM | POA: Diagnosis not present

## 2015-02-14 ENCOUNTER — Other Ambulatory Visit: Payer: Self-pay | Admitting: *Deleted

## 2015-02-14 DIAGNOSIS — N2581 Secondary hyperparathyroidism of renal origin: Secondary | ICD-10-CM | POA: Diagnosis not present

## 2015-02-14 DIAGNOSIS — D631 Anemia in chronic kidney disease: Secondary | ICD-10-CM | POA: Diagnosis not present

## 2015-02-14 DIAGNOSIS — N186 End stage renal disease: Secondary | ICD-10-CM | POA: Diagnosis not present

## 2015-02-14 DIAGNOSIS — Z992 Dependence on renal dialysis: Secondary | ICD-10-CM | POA: Diagnosis not present

## 2015-02-14 MED ORDER — MIRTAZAPINE 15 MG PO TABS: 7.5000 mg | ORAL_TABLET | Freq: Every day | ORAL | Status: DC

## 2015-02-14 NOTE — Telephone Encounter (Signed)
Received fax requesting refill on remeron.   Refill appropriate and filled per protocol.

## 2015-02-17 DIAGNOSIS — N186 End stage renal disease: Secondary | ICD-10-CM | POA: Diagnosis not present

## 2015-02-17 DIAGNOSIS — N2581 Secondary hyperparathyroidism of renal origin: Secondary | ICD-10-CM | POA: Diagnosis not present

## 2015-02-17 DIAGNOSIS — D631 Anemia in chronic kidney disease: Secondary | ICD-10-CM | POA: Diagnosis not present

## 2015-02-17 DIAGNOSIS — Z992 Dependence on renal dialysis: Secondary | ICD-10-CM | POA: Diagnosis not present

## 2015-02-19 DIAGNOSIS — N2581 Secondary hyperparathyroidism of renal origin: Secondary | ICD-10-CM | POA: Diagnosis not present

## 2015-02-19 DIAGNOSIS — D631 Anemia in chronic kidney disease: Secondary | ICD-10-CM | POA: Diagnosis not present

## 2015-02-19 DIAGNOSIS — N186 End stage renal disease: Secondary | ICD-10-CM | POA: Diagnosis not present

## 2015-02-19 DIAGNOSIS — Z992 Dependence on renal dialysis: Secondary | ICD-10-CM | POA: Diagnosis not present

## 2015-02-20 ENCOUNTER — Other Ambulatory Visit: Payer: Self-pay | Admitting: *Deleted

## 2015-02-20 MED ORDER — RANITIDINE HCL 150 MG PO TABS: 150.0000 mg | ORAL_TABLET | Freq: Every day | ORAL | Status: DC

## 2015-02-20 NOTE — Telephone Encounter (Signed)
Received fax requesting refill on Zantac.   Refill appropriate and filled per protocol.

## 2015-02-21 DIAGNOSIS — Z992 Dependence on renal dialysis: Secondary | ICD-10-CM | POA: Diagnosis not present

## 2015-02-21 DIAGNOSIS — N2581 Secondary hyperparathyroidism of renal origin: Secondary | ICD-10-CM | POA: Diagnosis not present

## 2015-02-21 DIAGNOSIS — N186 End stage renal disease: Secondary | ICD-10-CM | POA: Diagnosis not present

## 2015-02-21 DIAGNOSIS — D631 Anemia in chronic kidney disease: Secondary | ICD-10-CM | POA: Diagnosis not present

## 2015-02-24 DIAGNOSIS — D631 Anemia in chronic kidney disease: Secondary | ICD-10-CM | POA: Diagnosis not present

## 2015-02-24 DIAGNOSIS — N186 End stage renal disease: Secondary | ICD-10-CM | POA: Diagnosis not present

## 2015-02-24 DIAGNOSIS — N2581 Secondary hyperparathyroidism of renal origin: Secondary | ICD-10-CM | POA: Diagnosis not present

## 2015-02-24 DIAGNOSIS — Z992 Dependence on renal dialysis: Secondary | ICD-10-CM | POA: Diagnosis not present

## 2015-02-25 DIAGNOSIS — H34812 Central retinal vein occlusion, left eye, with macular edema: Secondary | ICD-10-CM | POA: Diagnosis not present

## 2015-02-26 DIAGNOSIS — N186 End stage renal disease: Secondary | ICD-10-CM | POA: Diagnosis not present

## 2015-02-26 DIAGNOSIS — N2581 Secondary hyperparathyroidism of renal origin: Secondary | ICD-10-CM | POA: Diagnosis not present

## 2015-02-26 DIAGNOSIS — D631 Anemia in chronic kidney disease: Secondary | ICD-10-CM | POA: Diagnosis not present

## 2015-02-26 DIAGNOSIS — Z992 Dependence on renal dialysis: Secondary | ICD-10-CM | POA: Diagnosis not present

## 2015-02-28 ENCOUNTER — Other Ambulatory Visit: Payer: Self-pay | Admitting: *Deleted

## 2015-02-28 ENCOUNTER — Other Ambulatory Visit: Payer: Self-pay | Admitting: Family Medicine

## 2015-02-28 DIAGNOSIS — N186 End stage renal disease: Secondary | ICD-10-CM | POA: Diagnosis not present

## 2015-02-28 DIAGNOSIS — N2581 Secondary hyperparathyroidism of renal origin: Secondary | ICD-10-CM | POA: Diagnosis not present

## 2015-02-28 DIAGNOSIS — D631 Anemia in chronic kidney disease: Secondary | ICD-10-CM | POA: Diagnosis not present

## 2015-02-28 DIAGNOSIS — Z992 Dependence on renal dialysis: Secondary | ICD-10-CM | POA: Diagnosis not present

## 2015-02-28 MED ORDER — LIDOCAINE-PRILOCAINE 2.5-2.5 % EX CREA: TOPICAL_CREAM | CUTANEOUS | Status: DC | PRN

## 2015-02-28 MED ORDER — POLYETHYLENE GLYCOL 3350 17 GM/SCOOP PO POWD: 17.0000 g | Freq: Every day | ORAL | Status: DC

## 2015-02-28 NOTE — Telephone Encounter (Signed)
Medication refilled per protocol. 

## 2015-02-28 NOTE — Telephone Encounter (Signed)
Received fax requesting refill on Elma cream.   Refill appropriate and filled per protocol.

## 2015-03-03 DIAGNOSIS — Z992 Dependence on renal dialysis: Secondary | ICD-10-CM | POA: Diagnosis not present

## 2015-03-03 DIAGNOSIS — D631 Anemia in chronic kidney disease: Secondary | ICD-10-CM | POA: Diagnosis not present

## 2015-03-03 DIAGNOSIS — N186 End stage renal disease: Secondary | ICD-10-CM | POA: Diagnosis not present

## 2015-03-03 DIAGNOSIS — N2581 Secondary hyperparathyroidism of renal origin: Secondary | ICD-10-CM | POA: Diagnosis not present

## 2015-03-05 DIAGNOSIS — D631 Anemia in chronic kidney disease: Secondary | ICD-10-CM | POA: Diagnosis not present

## 2015-03-05 DIAGNOSIS — Z992 Dependence on renal dialysis: Secondary | ICD-10-CM | POA: Diagnosis not present

## 2015-03-05 DIAGNOSIS — N2581 Secondary hyperparathyroidism of renal origin: Secondary | ICD-10-CM | POA: Diagnosis not present

## 2015-03-05 DIAGNOSIS — N186 End stage renal disease: Secondary | ICD-10-CM | POA: Diagnosis not present

## 2015-03-07 DIAGNOSIS — N186 End stage renal disease: Secondary | ICD-10-CM | POA: Diagnosis not present

## 2015-03-07 DIAGNOSIS — N2581 Secondary hyperparathyroidism of renal origin: Secondary | ICD-10-CM | POA: Diagnosis not present

## 2015-03-07 DIAGNOSIS — Z992 Dependence on renal dialysis: Secondary | ICD-10-CM | POA: Diagnosis not present

## 2015-03-07 DIAGNOSIS — D631 Anemia in chronic kidney disease: Secondary | ICD-10-CM | POA: Diagnosis not present

## 2015-03-10 DIAGNOSIS — D631 Anemia in chronic kidney disease: Secondary | ICD-10-CM | POA: Diagnosis not present

## 2015-03-10 DIAGNOSIS — Z992 Dependence on renal dialysis: Secondary | ICD-10-CM | POA: Diagnosis not present

## 2015-03-10 DIAGNOSIS — N2581 Secondary hyperparathyroidism of renal origin: Secondary | ICD-10-CM | POA: Diagnosis not present

## 2015-03-10 DIAGNOSIS — N186 End stage renal disease: Secondary | ICD-10-CM | POA: Diagnosis not present

## 2015-03-11 DIAGNOSIS — N186 End stage renal disease: Secondary | ICD-10-CM | POA: Diagnosis not present

## 2015-03-11 DIAGNOSIS — Z992 Dependence on renal dialysis: Secondary | ICD-10-CM | POA: Diagnosis not present

## 2015-03-12 ENCOUNTER — Other Ambulatory Visit: Payer: Self-pay | Admitting: *Deleted

## 2015-03-12 DIAGNOSIS — L84 Corns and callosities: Secondary | ICD-10-CM | POA: Diagnosis not present

## 2015-03-12 DIAGNOSIS — F0390 Unspecified dementia without behavioral disturbance: Secondary | ICD-10-CM | POA: Diagnosis not present

## 2015-03-12 DIAGNOSIS — N186 End stage renal disease: Secondary | ICD-10-CM | POA: Diagnosis not present

## 2015-03-12 DIAGNOSIS — E1051 Type 1 diabetes mellitus with diabetic peripheral angiopathy without gangrene: Secondary | ICD-10-CM | POA: Diagnosis not present

## 2015-03-12 DIAGNOSIS — Z992 Dependence on renal dialysis: Secondary | ICD-10-CM | POA: Diagnosis not present

## 2015-03-12 MED ORDER — ALBUTEROL SULFATE HFA 108 (90 BASE) MCG/ACT IN AERS: 2.0000 | INHALATION_SPRAY | RESPIRATORY_TRACT | Status: DC | PRN

## 2015-03-12 MED ORDER — TRAMADOL HCL 50 MG PO TABS: 50.0000 mg | ORAL_TABLET | Freq: Two times a day (BID) | ORAL | Status: DC | PRN

## 2015-03-12 NOTE — Telephone Encounter (Signed)
Received fax requesting refill on Ventolin from [place].   Prescription sent to pharmacy.   Also received fax requesting therapy for upper arm pain. Call placed to facility to inquire. Patient should be on Tramadol BID PRN. Medication was called to pharmacy on 12/24/2014, #2 refills. Facility has order, but no medication noted on cart.   Call placed to pharmacy. Pharmacy states that no order has been received since 2015.\  Refill called to pharmacy.

## 2015-03-13 DIAGNOSIS — F0390 Unspecified dementia without behavioral disturbance: Secondary | ICD-10-CM | POA: Diagnosis not present

## 2015-03-14 DIAGNOSIS — Z992 Dependence on renal dialysis: Secondary | ICD-10-CM | POA: Diagnosis not present

## 2015-03-14 DIAGNOSIS — N186 End stage renal disease: Secondary | ICD-10-CM | POA: Diagnosis not present

## 2015-03-14 DIAGNOSIS — F0390 Unspecified dementia without behavioral disturbance: Secondary | ICD-10-CM | POA: Diagnosis not present

## 2015-03-15 DIAGNOSIS — F0390 Unspecified dementia without behavioral disturbance: Secondary | ICD-10-CM | POA: Diagnosis not present

## 2015-03-16 DIAGNOSIS — F0390 Unspecified dementia without behavioral disturbance: Secondary | ICD-10-CM | POA: Diagnosis not present

## 2015-03-17 DIAGNOSIS — F0390 Unspecified dementia without behavioral disturbance: Secondary | ICD-10-CM | POA: Diagnosis not present

## 2015-03-17 DIAGNOSIS — Z992 Dependence on renal dialysis: Secondary | ICD-10-CM | POA: Diagnosis not present

## 2015-03-17 DIAGNOSIS — N186 End stage renal disease: Secondary | ICD-10-CM | POA: Diagnosis not present

## 2015-03-18 DIAGNOSIS — H348122 Central retinal vein occlusion, left eye, stable: Secondary | ICD-10-CM | POA: Diagnosis not present

## 2015-03-18 DIAGNOSIS — E113311 Type 2 diabetes mellitus with moderate nonproliferative diabetic retinopathy with macular edema, right eye: Secondary | ICD-10-CM | POA: Diagnosis not present

## 2015-03-18 DIAGNOSIS — H3563 Retinal hemorrhage, bilateral: Secondary | ICD-10-CM | POA: Diagnosis not present

## 2015-03-18 DIAGNOSIS — E113392 Type 2 diabetes mellitus with moderate nonproliferative diabetic retinopathy without macular edema, left eye: Secondary | ICD-10-CM | POA: Diagnosis not present

## 2015-03-18 DIAGNOSIS — F0390 Unspecified dementia without behavioral disturbance: Secondary | ICD-10-CM | POA: Diagnosis not present

## 2015-03-19 DIAGNOSIS — N186 End stage renal disease: Secondary | ICD-10-CM | POA: Diagnosis not present

## 2015-03-19 DIAGNOSIS — Z992 Dependence on renal dialysis: Secondary | ICD-10-CM | POA: Diagnosis not present

## 2015-03-19 DIAGNOSIS — F0390 Unspecified dementia without behavioral disturbance: Secondary | ICD-10-CM | POA: Diagnosis not present

## 2015-03-20 DIAGNOSIS — F0390 Unspecified dementia without behavioral disturbance: Secondary | ICD-10-CM | POA: Diagnosis not present

## 2015-03-21 DIAGNOSIS — N186 End stage renal disease: Secondary | ICD-10-CM | POA: Diagnosis not present

## 2015-03-21 DIAGNOSIS — Z992 Dependence on renal dialysis: Secondary | ICD-10-CM | POA: Diagnosis not present

## 2015-03-21 DIAGNOSIS — F0390 Unspecified dementia without behavioral disturbance: Secondary | ICD-10-CM | POA: Diagnosis not present

## 2015-03-22 DIAGNOSIS — F0390 Unspecified dementia without behavioral disturbance: Secondary | ICD-10-CM | POA: Diagnosis not present

## 2015-03-23 DIAGNOSIS — F0390 Unspecified dementia without behavioral disturbance: Secondary | ICD-10-CM | POA: Diagnosis not present

## 2015-03-24 DIAGNOSIS — Z992 Dependence on renal dialysis: Secondary | ICD-10-CM | POA: Diagnosis not present

## 2015-03-24 DIAGNOSIS — F0390 Unspecified dementia without behavioral disturbance: Secondary | ICD-10-CM | POA: Diagnosis not present

## 2015-03-24 DIAGNOSIS — N186 End stage renal disease: Secondary | ICD-10-CM | POA: Diagnosis not present

## 2015-03-25 DIAGNOSIS — F0390 Unspecified dementia without behavioral disturbance: Secondary | ICD-10-CM | POA: Diagnosis not present

## 2015-03-26 DIAGNOSIS — F0390 Unspecified dementia without behavioral disturbance: Secondary | ICD-10-CM | POA: Diagnosis not present

## 2015-03-26 DIAGNOSIS — Z992 Dependence on renal dialysis: Secondary | ICD-10-CM | POA: Diagnosis not present

## 2015-03-26 DIAGNOSIS — N186 End stage renal disease: Secondary | ICD-10-CM | POA: Diagnosis not present

## 2015-03-27 DIAGNOSIS — F0390 Unspecified dementia without behavioral disturbance: Secondary | ICD-10-CM | POA: Diagnosis not present

## 2015-03-28 DIAGNOSIS — N186 End stage renal disease: Secondary | ICD-10-CM | POA: Diagnosis not present

## 2015-03-28 DIAGNOSIS — F0390 Unspecified dementia without behavioral disturbance: Secondary | ICD-10-CM | POA: Diagnosis not present

## 2015-03-28 DIAGNOSIS — Z992 Dependence on renal dialysis: Secondary | ICD-10-CM | POA: Diagnosis not present

## 2015-03-29 DIAGNOSIS — F0390 Unspecified dementia without behavioral disturbance: Secondary | ICD-10-CM | POA: Diagnosis not present

## 2015-03-30 DIAGNOSIS — F0390 Unspecified dementia without behavioral disturbance: Secondary | ICD-10-CM | POA: Diagnosis not present

## 2015-03-31 DIAGNOSIS — Z992 Dependence on renal dialysis: Secondary | ICD-10-CM | POA: Diagnosis not present

## 2015-03-31 DIAGNOSIS — F0390 Unspecified dementia without behavioral disturbance: Secondary | ICD-10-CM | POA: Diagnosis not present

## 2015-03-31 DIAGNOSIS — N186 End stage renal disease: Secondary | ICD-10-CM | POA: Diagnosis not present

## 2015-04-01 DIAGNOSIS — F0390 Unspecified dementia without behavioral disturbance: Secondary | ICD-10-CM | POA: Diagnosis not present

## 2015-04-02 DIAGNOSIS — N186 End stage renal disease: Secondary | ICD-10-CM | POA: Diagnosis not present

## 2015-04-02 DIAGNOSIS — F0390 Unspecified dementia without behavioral disturbance: Secondary | ICD-10-CM | POA: Diagnosis not present

## 2015-04-02 DIAGNOSIS — Z992 Dependence on renal dialysis: Secondary | ICD-10-CM | POA: Diagnosis not present

## 2015-04-03 DIAGNOSIS — F0390 Unspecified dementia without behavioral disturbance: Secondary | ICD-10-CM | POA: Diagnosis not present

## 2015-04-04 DIAGNOSIS — F0390 Unspecified dementia without behavioral disturbance: Secondary | ICD-10-CM | POA: Diagnosis not present

## 2015-04-04 DIAGNOSIS — Z992 Dependence on renal dialysis: Secondary | ICD-10-CM | POA: Diagnosis not present

## 2015-04-04 DIAGNOSIS — N186 End stage renal disease: Secondary | ICD-10-CM | POA: Diagnosis not present

## 2015-04-05 DIAGNOSIS — F0390 Unspecified dementia without behavioral disturbance: Secondary | ICD-10-CM | POA: Diagnosis not present

## 2015-04-06 DIAGNOSIS — F0390 Unspecified dementia without behavioral disturbance: Secondary | ICD-10-CM | POA: Diagnosis not present

## 2015-04-07 DIAGNOSIS — N186 End stage renal disease: Secondary | ICD-10-CM | POA: Diagnosis not present

## 2015-04-07 DIAGNOSIS — Z992 Dependence on renal dialysis: Secondary | ICD-10-CM | POA: Diagnosis not present

## 2015-04-07 DIAGNOSIS — F0390 Unspecified dementia without behavioral disturbance: Secondary | ICD-10-CM | POA: Diagnosis not present

## 2015-04-08 DIAGNOSIS — F0390 Unspecified dementia without behavioral disturbance: Secondary | ICD-10-CM | POA: Diagnosis not present

## 2015-04-09 DIAGNOSIS — F0390 Unspecified dementia without behavioral disturbance: Secondary | ICD-10-CM | POA: Diagnosis not present

## 2015-04-09 DIAGNOSIS — Z992 Dependence on renal dialysis: Secondary | ICD-10-CM | POA: Diagnosis not present

## 2015-04-09 DIAGNOSIS — N186 End stage renal disease: Secondary | ICD-10-CM | POA: Diagnosis not present

## 2015-04-10 DIAGNOSIS — F0390 Unspecified dementia without behavioral disturbance: Secondary | ICD-10-CM | POA: Diagnosis not present

## 2015-04-11 DIAGNOSIS — N186 End stage renal disease: Secondary | ICD-10-CM | POA: Diagnosis not present

## 2015-04-11 DIAGNOSIS — Z992 Dependence on renal dialysis: Secondary | ICD-10-CM | POA: Diagnosis not present

## 2015-04-11 DIAGNOSIS — F0390 Unspecified dementia without behavioral disturbance: Secondary | ICD-10-CM | POA: Diagnosis not present

## 2015-04-12 DIAGNOSIS — F0390 Unspecified dementia without behavioral disturbance: Secondary | ICD-10-CM | POA: Diagnosis not present

## 2015-04-13 DIAGNOSIS — F0390 Unspecified dementia without behavioral disturbance: Secondary | ICD-10-CM | POA: Diagnosis not present

## 2015-04-14 DIAGNOSIS — N186 End stage renal disease: Secondary | ICD-10-CM | POA: Diagnosis not present

## 2015-04-14 DIAGNOSIS — F0390 Unspecified dementia without behavioral disturbance: Secondary | ICD-10-CM | POA: Diagnosis not present

## 2015-04-14 DIAGNOSIS — Z992 Dependence on renal dialysis: Secondary | ICD-10-CM | POA: Diagnosis not present

## 2015-04-15 ENCOUNTER — Ambulatory Visit: Payer: Commercial Managed Care - HMO | Admitting: Family Medicine

## 2015-04-15 DIAGNOSIS — F0390 Unspecified dementia without behavioral disturbance: Secondary | ICD-10-CM | POA: Diagnosis not present

## 2015-04-16 DIAGNOSIS — Z992 Dependence on renal dialysis: Secondary | ICD-10-CM | POA: Diagnosis not present

## 2015-04-16 DIAGNOSIS — F0390 Unspecified dementia without behavioral disturbance: Secondary | ICD-10-CM | POA: Diagnosis not present

## 2015-04-16 DIAGNOSIS — N186 End stage renal disease: Secondary | ICD-10-CM | POA: Diagnosis not present

## 2015-04-17 DIAGNOSIS — F0390 Unspecified dementia without behavioral disturbance: Secondary | ICD-10-CM | POA: Diagnosis not present

## 2015-04-18 DIAGNOSIS — N186 End stage renal disease: Secondary | ICD-10-CM | POA: Diagnosis not present

## 2015-04-18 DIAGNOSIS — F0390 Unspecified dementia without behavioral disturbance: Secondary | ICD-10-CM | POA: Diagnosis not present

## 2015-04-18 DIAGNOSIS — Z992 Dependence on renal dialysis: Secondary | ICD-10-CM | POA: Diagnosis not present

## 2015-04-19 DIAGNOSIS — F0390 Unspecified dementia without behavioral disturbance: Secondary | ICD-10-CM | POA: Diagnosis not present

## 2015-04-20 DIAGNOSIS — F0390 Unspecified dementia without behavioral disturbance: Secondary | ICD-10-CM | POA: Diagnosis not present

## 2015-04-21 DIAGNOSIS — F0390 Unspecified dementia without behavioral disturbance: Secondary | ICD-10-CM | POA: Diagnosis not present

## 2015-04-21 DIAGNOSIS — D631 Anemia in chronic kidney disease: Secondary | ICD-10-CM | POA: Diagnosis not present

## 2015-04-21 DIAGNOSIS — Z992 Dependence on renal dialysis: Secondary | ICD-10-CM | POA: Diagnosis not present

## 2015-04-21 DIAGNOSIS — N186 End stage renal disease: Secondary | ICD-10-CM | POA: Diagnosis not present

## 2015-04-22 ENCOUNTER — Ambulatory Visit (INDEPENDENT_AMBULATORY_CARE_PROVIDER_SITE_OTHER): Payer: Commercial Managed Care - HMO | Admitting: Family Medicine

## 2015-04-22 ENCOUNTER — Encounter: Payer: Self-pay | Admitting: Family Medicine

## 2015-04-22 VITALS — BP 136/74 | HR 80 | Temp 98.8°F | Resp 16 | Ht 63.0 in | Wt 177.0 lb

## 2015-04-22 DIAGNOSIS — E038 Other specified hypothyroidism: Secondary | ICD-10-CM | POA: Diagnosis not present

## 2015-04-22 DIAGNOSIS — R2681 Unsteadiness on feet: Secondary | ICD-10-CM

## 2015-04-22 DIAGNOSIS — Z992 Dependence on renal dialysis: Secondary | ICD-10-CM | POA: Diagnosis not present

## 2015-04-22 DIAGNOSIS — I1311 Hypertensive heart and chronic kidney disease without heart failure, with stage 5 chronic kidney disease, or end stage renal disease: Secondary | ICD-10-CM | POA: Diagnosis not present

## 2015-04-22 DIAGNOSIS — N189 Chronic kidney disease, unspecified: Secondary | ICD-10-CM | POA: Diagnosis not present

## 2015-04-22 DIAGNOSIS — N185 Chronic kidney disease, stage 5: Secondary | ICD-10-CM

## 2015-04-22 DIAGNOSIS — I5032 Chronic diastolic (congestive) heart failure: Secondary | ICD-10-CM | POA: Diagnosis not present

## 2015-04-22 DIAGNOSIS — M79622 Pain in left upper arm: Secondary | ICD-10-CM | POA: Diagnosis not present

## 2015-04-22 DIAGNOSIS — E1122 Type 2 diabetes mellitus with diabetic chronic kidney disease: Secondary | ICD-10-CM | POA: Diagnosis not present

## 2015-04-22 DIAGNOSIS — M25522 Pain in left elbow: Secondary | ICD-10-CM

## 2015-04-22 DIAGNOSIS — F0390 Unspecified dementia without behavioral disturbance: Secondary | ICD-10-CM | POA: Diagnosis not present

## 2015-04-22 DIAGNOSIS — N186 End stage renal disease: Secondary | ICD-10-CM

## 2015-04-22 DIAGNOSIS — E1129 Type 2 diabetes mellitus with other diabetic kidney complication: Secondary | ICD-10-CM | POA: Diagnosis not present

## 2015-04-22 LAB — CBC WITH DIFFERENTIAL/PLATELET
BASOS ABS: 62 {cells}/uL (ref 0–200)
Basophils Relative: 1 %
EOS ABS: 62 {cells}/uL (ref 15–500)
Eosinophils Relative: 1 %
HEMATOCRIT: 34.3 % — AB (ref 35.0–45.0)
HEMOGLOBIN: 10.8 g/dL — AB (ref 12.0–15.0)
Lymphocytes Relative: 33 %
Lymphs Abs: 2046 cells/uL (ref 850–3900)
MCH: 25.7 pg — AB (ref 27.0–33.0)
MCHC: 31.5 g/dL — ABNORMAL LOW (ref 32.0–36.0)
MCV: 81.5 fL (ref 80.0–100.0)
MONO ABS: 682 {cells}/uL (ref 200–950)
MONOS PCT: 11 %
MPV: 10 fL (ref 7.5–12.5)
NEUTROS ABS: 3348 {cells}/uL (ref 1500–7800)
Neutrophils Relative %: 54 %
PLATELETS: 179 10*3/uL (ref 140–400)
RBC: 4.21 MIL/uL (ref 3.80–5.10)
RDW: 15.5 % — ABNORMAL HIGH (ref 11.0–15.0)
WBC: 6.2 10*3/uL (ref 3.8–10.8)

## 2015-04-22 LAB — T4, FREE: FREE T4: 0.9 ng/dL (ref 0.8–1.8)

## 2015-04-22 LAB — TSH: TSH: 0.85 m[IU]/L

## 2015-04-22 LAB — HEMOGLOBIN A1C
Hgb A1c MFr Bld: 5.5 % (ref <5.7)
Mean Plasma Glucose: 111 mg/dL

## 2015-04-22 LAB — T3, FREE: T3 FREE: 2.4 pg/mL (ref 2.3–4.2)

## 2015-04-22 NOTE — Assessment & Plan Note (Signed)
Check thyroid function study

## 2015-04-22 NOTE — Assessment & Plan Note (Signed)
Currently compensated 

## 2015-04-22 NOTE — Assessment & Plan Note (Signed)
Continue current medication ,well controlled

## 2015-04-22 NOTE — Progress Notes (Signed)
Patient ID: [name], female   DOB: 02-Nov-1925, 82 y.o.   MRN: [id number]    Subjective:    Patient ID: [name], female    DOB: Oct 05, 1925, 82 y.o.   MRN: [id number]  Patient presents for 6 month F/U   Patient here for follow-up. She is still residing in the assisted living facility. She is still undergoing hemodialysis 3 days a week. At her last visit was set up physical therapy to assist her because of her multiple falls. Her oxygen was also recertify for chronic respiratory failure and chronic diastolic heart failure.  She is diabetes mellitus which is now diet-controlled her last A1c 6 months ago was 5.7%  She is hypothyroidism she is on Synthroid 25 g daily she is due for recheck on this.  She does complain of some pain in her left arm since she had her dialysis done a week ago. She states that when she was stuck with a port into her dialysis graft she had pain afterwards. She has pain when she tries to put her arm down but she is able to raise it without any difficulty. She's not received any pain medicine at her assisted living that she does have tramadol on file  Review Of Systems:  GEN- denies fatigue, fever, weight loss,weakness, recent illness HEENT- denies eye drainage, change in vision, nasal discharge, CVS- denies chest pain, palpitations RESP- denies SOB, cough, wheeze ABD- denies N/V, change in stools, abd pain GU- denies dysuria, hematuria, dribbling, incontinence MSK- + joint pain, muscle aches, injury Neuro- denies headache, dizziness, syncope, seizure activity       Objective:    BP 136/74 mmHg  Pulse 80  Temp(Src) 98.8 F (37.1 C) (Oral)  Resp 16  Ht 5\' 3"  (1.6 m)  Wt 177 lb (80.287 kg)  BMI 31.36 kg/m2  LMP 12/24/2010 GEN- NAD, alert and oriented x3 HEENT- PERRL, EOMI, non injected sclera, pink conjunctiva, MMM, oropharynx clear Neck- Supple, fair ROM, c spine NT CVS- RRR,3/6 SEM  RESP-CTAB EXT- No edema MSK- Fair ROM upper ext bilat,  LUE, no rash,no bruising, NT to palpation, graft in place  Pulses- Radial  2+       Assessment & Plan:      Problem List Items Addressed This Visit    Hypothyroidism    Check thyroid function study      Relevant Orders   TSH   T3, free   T4, free   Gait instability   ESRD (end stage renal disease) on dialysis ([place])   Diabetes mellitus with renal complications ([place])    Diet-controlled recheck A1c      Relevant Orders   Hemoglobin A1c   Chronic diastolic congestive heart failure (HCC) (Chronic)    Currently compensated      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 No sign of clot. Will use Ultram as needed       Note: This dictation was prepared with Dragon dictation along with smaller phrase technology. Any transcriptional errors that result from this process are unintentional.

## 2015-04-22 NOTE — Patient Instructions (Addendum)
Continue current medications Give Ultram for pain We will call with results  F/U 4 months

## 2015-04-22 NOTE — Assessment & Plan Note (Signed)
Diet-controlled recheck A1c

## 2015-04-23 DIAGNOSIS — F0390 Unspecified dementia without behavioral disturbance: Secondary | ICD-10-CM | POA: Diagnosis not present

## 2015-04-23 DIAGNOSIS — Z992 Dependence on renal dialysis: Secondary | ICD-10-CM | POA: Diagnosis not present

## 2015-04-23 DIAGNOSIS — N186 End stage renal disease: Secondary | ICD-10-CM | POA: Diagnosis not present

## 2015-04-24 DIAGNOSIS — F0390 Unspecified dementia without behavioral disturbance: Secondary | ICD-10-CM | POA: Diagnosis not present

## 2015-04-25 DIAGNOSIS — F0390 Unspecified dementia without behavioral disturbance: Secondary | ICD-10-CM | POA: Diagnosis not present

## 2015-04-25 DIAGNOSIS — N186 End stage renal disease: Secondary | ICD-10-CM | POA: Diagnosis not present

## 2015-04-25 DIAGNOSIS — Z992 Dependence on renal dialysis: Secondary | ICD-10-CM | POA: Diagnosis not present

## 2015-04-26 DIAGNOSIS — F0390 Unspecified dementia without behavioral disturbance: Secondary | ICD-10-CM | POA: Diagnosis not present

## 2015-04-27 DIAGNOSIS — F0390 Unspecified dementia without behavioral disturbance: Secondary | ICD-10-CM | POA: Diagnosis not present

## 2015-04-28 DIAGNOSIS — F0390 Unspecified dementia without behavioral disturbance: Secondary | ICD-10-CM | POA: Diagnosis not present

## 2015-04-28 DIAGNOSIS — N186 End stage renal disease: Secondary | ICD-10-CM | POA: Diagnosis not present

## 2015-04-28 DIAGNOSIS — Z992 Dependence on renal dialysis: Secondary | ICD-10-CM | POA: Diagnosis not present

## 2015-04-29 DIAGNOSIS — E113211 Type 2 diabetes mellitus with mild nonproliferative diabetic retinopathy with macular edema, right eye: Secondary | ICD-10-CM | POA: Diagnosis not present

## 2015-04-29 DIAGNOSIS — H43813 Vitreous degeneration, bilateral: Secondary | ICD-10-CM | POA: Diagnosis not present

## 2015-04-29 DIAGNOSIS — H348122 Central retinal vein occlusion, left eye, stable: Secondary | ICD-10-CM | POA: Diagnosis not present

## 2015-04-29 DIAGNOSIS — F0390 Unspecified dementia without behavioral disturbance: Secondary | ICD-10-CM | POA: Diagnosis not present

## 2015-04-29 DIAGNOSIS — E113292 Type 2 diabetes mellitus with mild nonproliferative diabetic retinopathy without macular edema, left eye: Secondary | ICD-10-CM | POA: Diagnosis not present

## 2015-04-30 ENCOUNTER — Other Ambulatory Visit: Payer: Self-pay | Admitting: *Deleted

## 2015-04-30 DIAGNOSIS — Z992 Dependence on renal dialysis: Secondary | ICD-10-CM | POA: Diagnosis not present

## 2015-04-30 DIAGNOSIS — N186 End stage renal disease: Secondary | ICD-10-CM | POA: Diagnosis not present

## 2015-04-30 DIAGNOSIS — F0390 Unspecified dementia without behavioral disturbance: Secondary | ICD-10-CM | POA: Diagnosis not present

## 2015-04-30 MED ORDER — AMLODIPINE BESYLATE 10 MG PO TABS: 10.0000 mg | ORAL_TABLET | ORAL | Status: DC

## 2015-04-30 MED ORDER — ISOSORBIDE MONONITRATE ER 60 MG PO TB24: 60.0000 mg | ORAL_TABLET | Freq: Every day | ORAL | Status: DC

## 2015-04-30 MED ORDER — CITALOPRAM HYDROBROMIDE 20 MG PO TABS: 20.0000 mg | ORAL_TABLET | ORAL | Status: DC

## 2015-04-30 NOTE — Telephone Encounter (Signed)
Received fax requesting refill on Norvasc, Celexa, and Imdur.  Refill appropriate and filled per protocol.

## 2015-05-01 DIAGNOSIS — F0390 Unspecified dementia without behavioral disturbance: Secondary | ICD-10-CM | POA: Diagnosis not present

## 2015-05-02 DIAGNOSIS — F0390 Unspecified dementia without behavioral disturbance: Secondary | ICD-10-CM | POA: Diagnosis not present

## 2015-05-02 DIAGNOSIS — Z992 Dependence on renal dialysis: Secondary | ICD-10-CM | POA: Diagnosis not present

## 2015-05-02 DIAGNOSIS — N186 End stage renal disease: Secondary | ICD-10-CM | POA: Diagnosis not present

## 2015-05-03 DIAGNOSIS — F0390 Unspecified dementia without behavioral disturbance: Secondary | ICD-10-CM | POA: Diagnosis not present

## 2015-05-04 DIAGNOSIS — F0390 Unspecified dementia without behavioral disturbance: Secondary | ICD-10-CM | POA: Diagnosis not present

## 2015-05-05 DIAGNOSIS — N2581 Secondary hyperparathyroidism of renal origin: Secondary | ICD-10-CM | POA: Diagnosis not present

## 2015-05-05 DIAGNOSIS — F0390 Unspecified dementia without behavioral disturbance: Secondary | ICD-10-CM | POA: Diagnosis not present

## 2015-05-05 DIAGNOSIS — Z992 Dependence on renal dialysis: Secondary | ICD-10-CM | POA: Diagnosis not present

## 2015-05-05 DIAGNOSIS — N186 End stage renal disease: Secondary | ICD-10-CM | POA: Diagnosis not present

## 2015-05-06 DIAGNOSIS — F0390 Unspecified dementia without behavioral disturbance: Secondary | ICD-10-CM | POA: Diagnosis not present

## 2015-05-07 DIAGNOSIS — N186 End stage renal disease: Secondary | ICD-10-CM | POA: Diagnosis not present

## 2015-05-07 DIAGNOSIS — Z992 Dependence on renal dialysis: Secondary | ICD-10-CM | POA: Diagnosis not present

## 2015-05-07 DIAGNOSIS — F0390 Unspecified dementia without behavioral disturbance: Secondary | ICD-10-CM | POA: Diagnosis not present

## 2015-05-08 DIAGNOSIS — F0390 Unspecified dementia without behavioral disturbance: Secondary | ICD-10-CM | POA: Diagnosis not present

## 2015-05-09 DIAGNOSIS — N186 End stage renal disease: Secondary | ICD-10-CM | POA: Diagnosis not present

## 2015-05-09 DIAGNOSIS — F0390 Unspecified dementia without behavioral disturbance: Secondary | ICD-10-CM | POA: Diagnosis not present

## 2015-05-09 DIAGNOSIS — Z992 Dependence on renal dialysis: Secondary | ICD-10-CM | POA: Diagnosis not present

## 2015-05-10 DIAGNOSIS — F0390 Unspecified dementia without behavioral disturbance: Secondary | ICD-10-CM | POA: Diagnosis not present

## 2015-05-11 DIAGNOSIS — N186 End stage renal disease: Secondary | ICD-10-CM | POA: Diagnosis not present

## 2015-05-11 DIAGNOSIS — F0390 Unspecified dementia without behavioral disturbance: Secondary | ICD-10-CM | POA: Diagnosis not present

## 2015-05-11 DIAGNOSIS — Z992 Dependence on renal dialysis: Secondary | ICD-10-CM | POA: Diagnosis not present

## 2015-05-12 DIAGNOSIS — IMO0001 Reserved for inherently not codable concepts without codable children: Secondary | ICD-10-CM

## 2015-05-12 DIAGNOSIS — F0390 Unspecified dementia without behavioral disturbance: Secondary | ICD-10-CM | POA: Diagnosis not present

## 2015-05-12 DIAGNOSIS — Z992 Dependence on renal dialysis: Secondary | ICD-10-CM | POA: Diagnosis not present

## 2015-05-12 DIAGNOSIS — N186 End stage renal disease: Secondary | ICD-10-CM | POA: Diagnosis not present

## 2015-05-12 HISTORY — DX: Reserved for inherently not codable concepts without codable children: IMO0001

## 2015-05-13 ENCOUNTER — Other Ambulatory Visit: Payer: Self-pay

## 2015-05-13 DIAGNOSIS — F0390 Unspecified dementia without behavioral disturbance: Secondary | ICD-10-CM | POA: Diagnosis not present

## 2015-05-13 NOTE — Progress Notes (Signed)
Request rec'd from [facility]/ Dr. Lowanda Foster for a fistulogram.  Stated the pt. C/o pain and numbness at the left arm AVF site.  Fistulogram scheduled for 05/15/15 @ 11:00 AM.  Instructions will be faxed to the dialysis center to give to pt.

## 2015-05-14 DIAGNOSIS — Z992 Dependence on renal dialysis: Secondary | ICD-10-CM | POA: Diagnosis not present

## 2015-05-14 DIAGNOSIS — N186 End stage renal disease: Secondary | ICD-10-CM | POA: Diagnosis not present

## 2015-05-14 DIAGNOSIS — F0390 Unspecified dementia without behavioral disturbance: Secondary | ICD-10-CM | POA: Diagnosis not present

## 2015-05-15 ENCOUNTER — Encounter (HOSPITAL_COMMUNITY)
Admission: RE | Disposition: A | Discharge status: Home / Self Care | Payer: Self-pay | Source: Ambulatory Visit | Attending: Vascular Surgery

## 2015-05-15 ENCOUNTER — Other Ambulatory Visit: Payer: Self-pay | Admitting: *Deleted

## 2015-05-15 ENCOUNTER — Ambulatory Visit (HOSPITAL_COMMUNITY)
Admission: RE | Admit: 2015-05-15 | Discharge: 2015-05-15 | Disposition: A | Discharge status: Home / Self Care | Payer: Commercial Managed Care - HMO | Source: Ambulatory Visit | Attending: Vascular Surgery | Admitting: Vascular Surgery

## 2015-05-15 ENCOUNTER — Telehealth: Payer: Self-pay | Admitting: Vascular Surgery

## 2015-05-15 DIAGNOSIS — I728 Aneurysm of other specified arteries: Secondary | ICD-10-CM | POA: Insufficient documentation

## 2015-05-15 DIAGNOSIS — Z992 Dependence on renal dialysis: Secondary | ICD-10-CM | POA: Insufficient documentation

## 2015-05-15 DIAGNOSIS — N3281 Overactive bladder: Secondary | ICD-10-CM | POA: Diagnosis not present

## 2015-05-15 DIAGNOSIS — F329 Major depressive disorder, single episode, unspecified: Secondary | ICD-10-CM | POA: Diagnosis not present

## 2015-05-15 DIAGNOSIS — N186 End stage renal disease: Secondary | ICD-10-CM | POA: Diagnosis not present

## 2015-05-15 DIAGNOSIS — Z7982 Long term (current) use of aspirin: Secondary | ICD-10-CM | POA: Insufficient documentation

## 2015-05-15 DIAGNOSIS — I132 Hypertensive heart and chronic kidney disease with heart failure and with stage 5 chronic kidney disease, or end stage renal disease: Secondary | ICD-10-CM | POA: Insufficient documentation

## 2015-05-15 DIAGNOSIS — I69351 Hemiplegia and hemiparesis following cerebral infarction affecting right dominant side: Secondary | ICD-10-CM | POA: Diagnosis not present

## 2015-05-15 DIAGNOSIS — T82898A Other specified complication of vascular prosthetic devices, implants and grafts, initial encounter: Secondary | ICD-10-CM | POA: Insufficient documentation

## 2015-05-15 DIAGNOSIS — K589 Irritable bowel syndrome without diarrhea: Secondary | ICD-10-CM | POA: Diagnosis not present

## 2015-05-15 DIAGNOSIS — E079 Disorder of thyroid, unspecified: Secondary | ICD-10-CM | POA: Diagnosis not present

## 2015-05-15 DIAGNOSIS — F0390 Unspecified dementia without behavioral disturbance: Secondary | ICD-10-CM | POA: Diagnosis not present

## 2015-05-15 DIAGNOSIS — I471 Supraventricular tachycardia: Secondary | ICD-10-CM | POA: Insufficient documentation

## 2015-05-15 DIAGNOSIS — M1990 Unspecified osteoarthritis, unspecified site: Secondary | ICD-10-CM | POA: Diagnosis not present

## 2015-05-15 DIAGNOSIS — I509 Heart failure, unspecified: Secondary | ICD-10-CM | POA: Insufficient documentation

## 2015-05-15 DIAGNOSIS — D631 Anemia in chronic kidney disease: Secondary | ICD-10-CM | POA: Diagnosis not present

## 2015-05-15 DIAGNOSIS — E785 Hyperlipidemia, unspecified: Secondary | ICD-10-CM | POA: Insufficient documentation

## 2015-05-15 DIAGNOSIS — E1122 Type 2 diabetes mellitus with diabetic chronic kidney disease: Secondary | ICD-10-CM | POA: Diagnosis not present

## 2015-05-15 DIAGNOSIS — E1136 Type 2 diabetes mellitus with diabetic cataract: Secondary | ICD-10-CM | POA: Insufficient documentation

## 2015-05-15 DIAGNOSIS — F419 Anxiety disorder, unspecified: Secondary | ICD-10-CM | POA: Diagnosis not present

## 2015-05-15 DIAGNOSIS — M109 Gout, unspecified: Secondary | ICD-10-CM | POA: Diagnosis not present

## 2015-05-15 DIAGNOSIS — Y832 Surgical operation with anastomosis, bypass or graft as the cause of abnormal reaction of the patient, or of later complication, without mention of misadventure at the time of the procedure: Secondary | ICD-10-CM | POA: Insufficient documentation

## 2015-05-15 DIAGNOSIS — Z01810 Encounter for preprocedural cardiovascular examination: Secondary | ICD-10-CM

## 2015-05-15 DIAGNOSIS — K219 Gastro-esophageal reflux disease without esophagitis: Secondary | ICD-10-CM | POA: Diagnosis not present

## 2015-05-15 HISTORY — PX: PERIPHERAL VASCULAR CATHETERIZATION: SHX172C

## 2015-05-15 LAB — POCT I-STAT, CHEM 8
BUN: 39 mg/dL — AB (ref 6–20)
CALCIUM ION: 1.07 mmol/L — AB (ref 1.13–1.30)
CHLORIDE: 94 mmol/L — AB (ref 101–111)
CREATININE: 5.9 mg/dL — AB (ref 0.44–1.00)
GLUCOSE: 117 mg/dL — AB (ref 65–99)
HCT: 37 % (ref 36.0–46.0)
Hemoglobin: 12.6 g/dL (ref 12.0–15.0)
POTASSIUM: 4.6 mmol/L (ref 3.5–5.1)
Sodium: 137 mmol/L (ref 135–145)
TCO2: 32 mmol/L (ref 0–100)

## 2015-05-15 SURGERY — A/V SHUNTOGRAM/FISTULAGRAM
Anesthesia: LOCAL | Laterality: Left

## 2015-05-15 MED ORDER — SODIUM CHLORIDE 0.9% FLUSH
3.0000 mL | Freq: Two times a day (BID) | INTRAVENOUS | Status: DC
Start: 2015-05-15 — End: 2015-05-15

## 2015-05-15 MED ORDER — IODIXANOL 320 MG/ML IV SOLN
INTRAVENOUS | Status: DC | PRN
  Administered 2015-05-15: 60 mL via INTRAVENOUS

## 2015-05-15 MED ORDER — HEPARIN (PORCINE) IN NACL 2-0.9 UNIT/ML-% IJ SOLN
INTRAMUSCULAR | Status: DC | PRN
  Administered 2015-05-15: 500 mL

## 2015-05-15 MED ORDER — HEPARIN (PORCINE) IN NACL 2-0.9 UNIT/ML-% IJ SOLN
INTRAMUSCULAR | Status: AC
  Filled 2015-05-15: qty 500

## 2015-05-15 MED ORDER — LIDOCAINE HCL (PF) 1 % IJ SOLN
INTRAMUSCULAR | Status: AC
  Filled 2015-05-15: qty 30

## 2015-05-15 MED ORDER — LIDOCAINE HCL (PF) 1 % IJ SOLN
INTRAMUSCULAR | Status: DC | PRN
  Administered 2015-05-15: 2 mL

## 2015-05-15 MED ORDER — ACETAMINOPHEN 325 MG PO TABS: 650.0000 mg | ORAL_TABLET | ORAL | Status: DC | PRN

## 2015-05-15 MED ORDER — SODIUM CHLORIDE 0.9% FLUSH: 3.0000 mL | INTRAVENOUS | Status: DC | PRN

## 2015-05-15 MED ORDER — SODIUM CHLORIDE 0.9 % IV SOLN
250.0000 mL | INTRAVENOUS | Status: DC | PRN
Start: 2015-05-15 — End: 2015-05-15

## 2015-05-15 SURGICAL SUPPLY — 11 items
BAG SNAP BAND KOVER 36X36 (MISCELLANEOUS) ×2 IMPLANT
COVER DOME SNAP 22 D (MISCELLANEOUS) ×2 IMPLANT
COVER PRB 48X5XTLSCP FOLD TPE (BAG) ×1 IMPLANT
COVER PROBE 5X48 (BAG) ×1
KIT MICROINTRODUCER STIFF 5F (SHEATH) ×2 IMPLANT
PROTECTION STATION PRESSURIZED (MISCELLANEOUS) ×2
STATION PROTECTION PRESSURIZED (MISCELLANEOUS) ×1 IMPLANT
STOPCOCK MORSE 400PSI 3WAY (MISCELLANEOUS) ×2 IMPLANT
TRAY PV CATH (CUSTOM PROCEDURE TRAY) ×2 IMPLANT
TUBING CIL FLEX 10 FLL-RA (TUBING) ×2 IMPLANT
WIRE BENTSON .035X145CM (WIRE) ×2 IMPLANT

## 2015-05-15 NOTE — H&P (Signed)
Brief History and Physical  History of Present Illness  [name] is a 82 y.o. female who presents with chief complaint: poor flow rates in L arm fistula and clotting.  The patient presents today for left arm fistulogram, possible intervention.    Past Medical History  Diagnosis Date  . Anxiety   . Diabetes mellitus, type 2 ([place])   . Hypertension   . IBS (irritable bowel syndrome)     with costipation  . Overactive bladder   . Cataracts, both eyes   . Gynecologic cancer   . Hyperlipidemia   . Anemia in CKD (chronic kidney disease)   . SVT (supraventricular tachycardia) ([place])   . CHF (congestive heart failure) ([place])   . Arthritis     gout  . SOB (shortness of breath)     nml spirometry 09/05/06  . Stroke [facility])     residual right side weakness and pain  . Thyroid disease   . Depression   . GERD (gastroesophageal reflux disease)   . CKD (chronic kidney disease), stage V ([place])     on HD    Past Surgical History  Procedure Laterality Date  . Vesicovaginal fistula closure w/ tah    . Av fistula placement, brachiocephalic  41/28/7867    left arm   by Dr. Bridgett Larsson  . Tee without cardioversion  2011    2/2 Strep Bovis infection    Social History   Social History  . Marital Status: Widowed    Spouse Name: N/A  . Number of Children: N/A  . Years of Education: N/A   Occupational History  . Not on file.   Social History Main Topics  . Smoking status: Never Smoker   . Smokeless tobacco: Never Used  . Alcohol Use: No  . Drug Use: No  . Sexual Activity: No   Other Topics Concern  . Not on file   Social History Narrative    No family history on file.  No current facility-administered medications on file prior to encounter.   Current Outpatient Prescriptions on File Prior to Encounter  Medication Sig Dispense Refill  . acetaminophen (TYLENOL) 325 MG tablet Take 650 mg by mouth every 6 (six) hours as needed.    Marland Kitchen albuterol (PROVENTIL HFA;VENTOLIN HFA)  108 (90 Base) MCG/ACT inhaler Inhale 2 puffs into the lungs every 4 (four) hours as needed. 18 g 6  . amLODipine (NORVASC) 10 MG tablet Take 1 tablet (10 mg total) by mouth every morning. 30 tablet 5  . aspirin (ASPIRIN LOW DOSE) 81 MG EC tablet TAKE (1) TABLET BY MOUTH ONCE DAILY. 30 tablet 11  . bisacodyl (DULCOLAX) 10 MG suppository 1 suppository rectally  Daily every 3 days as needed for constipation 12 suppository 2  . citalopram (CELEXA) 20 MG tablet Take 1 tablet (20 mg total) by mouth every morning. 30 tablet 5  . docusate sodium (COLACE) 100 MG capsule Take 1 capsule (100 mg total) by mouth 2 (two) times daily. 60 capsule 11  . ipratropium (ATROVENT) 0.02 % nebulizer solution Take 0.5 mg by nebulization 3 (three) times daily.    . isosorbide mononitrate (IMDUR) 60 MG 24 hr tablet Take 1 tablet (60 mg total) by mouth daily. 30 tablet 5  . levothyroxine (SYNTHROID, LEVOTHROID) 25 MCG tablet Take 1 tablet (25 mcg total) by mouth daily before breakfast. 30 tablet 5  . lidocaine-prilocaine (EMLA) cream Apply topically as needed. Apply to L upper arm (1) hour prior to  dialysis on MWF. Wrap with plastic wrap after applying. 30 g 3  . mirtazapine (REMERON) 15 MG tablet Take 0.5 tablets (7.5 mg total) by mouth at bedtime. 15 tablet 6  . Multiple Vitamin (DAILY VITE) TABS Take 1 tablet by mouth daily. 30 tablet 11  . Polyethyl Glycol-Propyl Glycol (SYSTANE OP) Apply 1 drop to eye 4 (four) times daily as needed (dry eyes).     . polyethylene glycol powder (MIRALAX) powder Take 17 g by mouth daily. Hold for diarrhea 255 g 6  . ranitidine (ZANTAC) 150 MG tablet Take 1 tablet (150 mg total) by mouth at bedtime. 30 tablet 6  . sevelamer (RENVELA) 800 MG tablet Take 800-2,400 mg by mouth See admin instructions. 3 tabs with each meal and 1tab twice daily with snacks    . traMADol (ULTRAM) 50 MG tablet Take 1 tablet (50 mg total) by mouth 2 (two) times daily as needed. 45 tablet 2  . travoprost,  benzalkonium, (TRAVATAN) 0.004 % ophthalmic solution Place 1 drop into both eyes at bedtime.    . Blood Glucose Calibration (ACCU-CHEK SMARTVIEW CONTROL) LIQD Use to monitor FSBS 1 time daily. DX: 250.00 3 each 3  . Blood Glucose Monitoring Suppl (ACCU-CHEK NANO SMARTVIEW) W/DEVICE KIT Use to monitor FSBS 1 time daily. DX: 250.00 1 kit 1  . glucose blood (ACCU-CHEK SMARTVIEW) test strip Use to monitor FSBS 1 time daily. DX: 250.00 100 each 3  . Lancets Misc. (ACCU-CHEK FASTCLIX LANCET) KIT Use to monitor FSBS 1 time daily. DX: 250.00 1 kit 11  . UNABLE TO FIND Dispense one rolling walker with seat attachment 1 each 0    No Known Allergies  Review of Systems: As listed above, otherwise negative.  Physical Examination  Filed Vitals:   05/15/15 0837  BP: 158/98  Pulse: 81  Temp: 98.2 F (36.8 C)  TempSrc: Oral  Resp: 15  Height: _0  (1.676 m)  Weight: 173 lb (78.472 kg)  SpO2: 95%    General: A&O x 3, WDWN, elderly  Pulmonary: Sym exp, good air movt, CTAB, no rales, rhonchi, & wheezing  Cardiac: RRR, Nl S1, S2, no Murmurs, rubs or gallops  Gastrointestinal: soft, NTND, -G/R, - HSM, - masses, - CVAT B  Musculoskeletal: M/S 5/5 throughout , Extremities without ischemic changes , pseudoaneurysmal segment in mid-segment, pulsatile character in left arm fistula, +thump in left arm fistula on ausc  Laboratory See iStat  Medical Decision Making  [name] is a 82 y.o. female who presents with: likely venous stenosis of L BC AVF.   The patient is scheduled for: L arm fistulogram, possible intervention. I discussed with the patient the nature of angiographic procedures, especially the limited patencies of any endovascular intervention.  The patient is aware of that the risks of an angiographic procedure include but are not limited to: bleeding, infection, access site complications, renal failure, embolization, rupture of vessel, dissection, possible need for emergent  surgical intervention, possible need for surgical procedures to treat the patient's pathology, and stroke and death.    The patient is aware of the risks and agrees to proceed.  Adele Barthel, MD [facility] Office: [phone] Pager: [phone]  05/15/2015, 10:16 AM

## 2015-05-15 NOTE — Discharge Instructions (Signed)
Fistulogram, Care After °Refer to this sheet in the next few weeks. These instructions provide you with information on caring for yourself after your procedure. Your health care provider may also give you more specific instructions. Your treatment has been planned according to current medical practices, but problems sometimes occur. Call your health care provider if you have any problems or questions after your procedure. °WHAT TO EXPECT AFTER THE PROCEDURE °After your procedure, it is typical to have the following: °· A small amount of discomfort in the area where the catheters were placed. °· A small amount of bruising around the fistula. °· Sleepiness and fatigue. °HOME CARE INSTRUCTIONS °· Rest at home for the day following your procedure. °· Do not drive or operate heavy machinery while taking pain medicine. °· Take medicines only as directed by your health care provider. °· Do not take baths, swim, or use a hot tub until your health care provider approves. You may shower 24 hours after the procedure or as directed by your health care provider. °· There are many different ways to close and cover an incision, including stitches, skin glue, and adhesive strips. Follow your health care provider's instructions on: °¨ Incision care. °¨ Bandage (dressing) changes and removal. °¨ Incision closure removal. °· Monitor your dialysis fistula carefully. °SEEK MEDICAL CARE IF: °· You have drainage, redness, swelling, or pain at your catheter site. °· You have a fever. °· You have chills. °SEEK IMMEDIATE MEDICAL CARE IF: °· You feel weak. °· You have trouble balancing. °· You have trouble moving your arms or legs. °· You have problems with your speech or vision. °· You can no longer feel a vibration or buzz when you put your fingers over your dialysis fistula. °· The limb that was used for the procedure: °¨ Swells. °¨ Is painful. °¨ Is cold. °¨ Is discolored, such as blue or pale white. °  °This information is not intended  to replace advice given to you by your health care provider. Make sure you discuss any questions you have with your health care provider. °  °Document Released: 05/14/2013 Document Reviewed: 05/14/2013 °Elsevier Interactive Patient Education ©2016 [employer] Inc. ° °

## 2015-05-15 NOTE — Telephone Encounter (Signed)
-----   Message from Mena Goes, RN sent at 05/15/2015  1:34 PM EDT ----- Regarding: schedule   ----- Message -----    From: Conrad Newberry, MD    Sent: 05/15/2015   1:27 PM      To: [address]  YAREXI COMMERFORD [id number] 1925/05/31  PROCEDURE: left brachiocephalic arteriovenous fistula cannulation under ultrasound guidance left arm fistulogram  Follow-up: refer patient to cardiology for preop clearance for a L BC AVF pseudoaneurysm plication, follow up after than appointment

## 2015-05-15 NOTE — Op Note (Addendum)
    OPERATIVE NOTE   PROCEDURE: 1. left brachiocephalic arteriovenous fistula cannulation under ultrasound guidance 2. left arm fistulogram  PRE-OPERATIVE DIAGNOSIS: Malfunctioning left brachiocephalic arteriovenous fistula  POST-OPERATIVE DIAGNOSIS: same as above   SURGEON: Adele Barthel, MD  ANESTHESIA: local  ESTIMATED BLOOD LOSS: 5 cc  FINDING(S): 1. Patent tortuous fistula throughout  2. Moderate-large pseudoaneurysm in mid-segment of fistula corresponding to area of concern  3. Calcification in wall distal to pseudoaneurysm 4. Proximal cephalic vein with confluence with proximal subclavian vein with evidence of sclerotic vein without hemodynamically significant stenoses  SPECIMEN(S):  None  CONTRAST: 60 cc  INDICATIONS: [name] is a 82 y.o. female who  presents with likely venous stenosis in left brachiocephalic arteriovenous fistula.  The patient is scheduled for left arm fistulogram, possible intervention.  The patient is aware the risks include but are not limited to: bleeding, infection, thrombosis of the cannulated access, and possible anaphylactic reaction to the contrast.  The patient is aware of the risks of the procedure and elects to proceed forward.  DESCRIPTION: After full informed written consent was obtained, the patient was brought back to the angiography suite and placed supine upon the angiography table.  The patient was connected to monitoring equipment.  The left upper arm was prepped and draped in the standard fashion for a left arm fistulogram.  Under ultrasound guidance, the left brachiocephalic arteriovenous fistula was cannulated with a micropuncture needle.  The microwire was advanced into the fistula and the needle was exchanged for the a microsheath, which was lodged 2 cm into the access.  The wire was removed and the sheath was connected to the IV extension tubing.  Hand injections were completed to image the access from the antecubitum up to  the level of axilla.  The central venous structures were also imaged by hand injections.  I passed a Bentson up the fistula for ~10 cm before the tortuous segment limited any further advance.  Several different projections were used for injections to better image the segment between the pseudoaneurysm segment and the distal calcified segment.  Based on the images, this patient will need: plication of pseudoaneurysm.  A 4-0 Monocryl purse-string suture was sewn around the sheath.  The sheath was removed while tying down the suture.  A sterile bandage was applied to the puncture site.   COMPLICATIONS: none  CONDITION: stable  Adele Barthel, MD [facility] [place] Office: [phone] Pager: [phone]  05/15/2015 1:22 PM

## 2015-05-15 NOTE — Telephone Encounter (Signed)
sched carodiology appt at [facility] 5/11 at 1:40. Spoke to [place] at [facility], they rejected a 5/12 appt w/ [facility] because pt has MWF dialysis; sched appt on 5/18 at 2:15.

## 2015-05-16 ENCOUNTER — Encounter (HOSPITAL_COMMUNITY): Payer: Self-pay | Admitting: Vascular Surgery

## 2015-05-16 ENCOUNTER — Other Ambulatory Visit: Payer: Self-pay | Admitting: Family Medicine

## 2015-05-16 DIAGNOSIS — F0390 Unspecified dementia without behavioral disturbance: Secondary | ICD-10-CM | POA: Diagnosis not present

## 2015-05-16 NOTE — Telephone Encounter (Signed)
Refill appropriate and filled per protocol. 

## 2015-05-17 DIAGNOSIS — N186 End stage renal disease: Secondary | ICD-10-CM | POA: Diagnosis not present

## 2015-05-17 DIAGNOSIS — Z992 Dependence on renal dialysis: Secondary | ICD-10-CM | POA: Diagnosis not present

## 2015-05-17 DIAGNOSIS — F0390 Unspecified dementia without behavioral disturbance: Secondary | ICD-10-CM | POA: Diagnosis not present

## 2015-05-18 DIAGNOSIS — F0390 Unspecified dementia without behavioral disturbance: Secondary | ICD-10-CM | POA: Diagnosis not present

## 2015-05-19 DIAGNOSIS — F0390 Unspecified dementia without behavioral disturbance: Secondary | ICD-10-CM | POA: Diagnosis not present

## 2015-05-19 DIAGNOSIS — N186 End stage renal disease: Secondary | ICD-10-CM | POA: Diagnosis not present

## 2015-05-19 DIAGNOSIS — Z992 Dependence on renal dialysis: Secondary | ICD-10-CM | POA: Diagnosis not present

## 2015-05-19 DIAGNOSIS — D631 Anemia in chronic kidney disease: Secondary | ICD-10-CM | POA: Diagnosis not present

## 2015-05-20 DIAGNOSIS — F0390 Unspecified dementia without behavioral disturbance: Secondary | ICD-10-CM | POA: Diagnosis not present

## 2015-05-21 ENCOUNTER — Telehealth: Payer: Self-pay | Admitting: Family Medicine

## 2015-05-21 DIAGNOSIS — F0390 Unspecified dementia without behavioral disturbance: Secondary | ICD-10-CM | POA: Diagnosis not present

## 2015-05-21 DIAGNOSIS — N186 End stage renal disease: Secondary | ICD-10-CM | POA: Diagnosis not present

## 2015-05-21 DIAGNOSIS — Z992 Dependence on renal dialysis: Secondary | ICD-10-CM | POA: Diagnosis not present

## 2015-05-21 NOTE — Telephone Encounter (Signed)
rec'd call form Cardilologist in [place].  Pt coming for pre op surgical clearance on request from [facility].  Need [employer] referral.  Referral placed.  Auth # [id number] for 6 visits dated 05/22/15 - 11/18/15  Dr Jacinta Shoe.   Dx Z01.810, I50.32, R00.1

## 2015-05-22 ENCOUNTER — Other Ambulatory Visit: Payer: Self-pay | Admitting: *Deleted

## 2015-05-22 ENCOUNTER — Ambulatory Visit (INDEPENDENT_AMBULATORY_CARE_PROVIDER_SITE_OTHER): Payer: Commercial Managed Care - HMO | Admitting: Cardiovascular Disease

## 2015-05-22 ENCOUNTER — Encounter: Payer: Self-pay | Admitting: Cardiovascular Disease

## 2015-05-22 ENCOUNTER — Encounter: Payer: Self-pay | Admitting: *Deleted

## 2015-05-22 VITALS — BP 120/70 | HR 79 | Ht 63.0 in | Wt 181.0 lb

## 2015-05-22 DIAGNOSIS — I5032 Chronic diastolic (congestive) heart failure: Secondary | ICD-10-CM

## 2015-05-22 DIAGNOSIS — Z01818 Encounter for other preprocedural examination: Secondary | ICD-10-CM | POA: Diagnosis not present

## 2015-05-22 DIAGNOSIS — I10 Essential (primary) hypertension: Secondary | ICD-10-CM | POA: Diagnosis not present

## 2015-05-22 DIAGNOSIS — R079 Chest pain, unspecified: Secondary | ICD-10-CM | POA: Diagnosis not present

## 2015-05-22 DIAGNOSIS — R011 Cardiac murmur, unspecified: Secondary | ICD-10-CM

## 2015-05-22 DIAGNOSIS — F0390 Unspecified dementia without behavioral disturbance: Secondary | ICD-10-CM | POA: Diagnosis not present

## 2015-05-22 NOTE — Progress Notes (Signed)
Patient ID: [name], female   DOB: 1925-11-08, 82 y.o.   MRN: [id number]       CARDIOLOGY CONSULT NOTE  Patient ID: [name] MRN: [id number] DOB/AGE: 1925/12/04 82 y.o.  Admit date: (Not on file) Primary Physician: Vic Blackbird, MD Referring Physician: Buelah Manis MD  Reason for Consultation: preop clearance  HPI: The patient is an 82 year old woman woman with a history of chronic kidney disease stage V on hemodialysis, diabetes, hypertension, hyperlipidemia, and chronic diastolic heart failure.   Echocardiogram 01/18/11 demonstrated normal left ventricular systolic function and regional wall motion, LVEF 60-65%, with grade 2 diastolic dysfunction and elevated left atrial filling pressures.  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  ECG 05/15/15 which I personally interpreted demonstrated sinus rhythm with LVH and repolarization abnormalities with possible old septal infarct.  Approximately three days ago while walking she experienced chest tightness and pain. This has occurred before with exertion and she attributed it to indigestion. She denies shortness of breath.  No Known Allergies  Current Outpatient Prescriptions  Medication Sig Dispense Refill  . acetaminophen (TYLENOL) 325 MG tablet Take 650 mg by mouth every 6 (six) hours as needed.    Marland Kitchen albuterol (PROVENTIL HFA;VENTOLIN HFA) 108 (90 Base) MCG/ACT inhaler Inhale 2 puffs into the lungs every 4 (four) hours as needed. 18 g 6  . albuterol (PROVENTIL) (2.5 MG/3ML) 0.083% nebulizer solution Take 2.5 mg by nebulization every 4 (four) hours as needed for wheezing or shortness of breath.    Marland Kitchen amLODipine (NORVASC) 10 MG tablet Take 1 tablet (10 mg total) by mouth every morning. 30 tablet 5  . aspirin (ASPIRIN LOW DOSE) 81 MG EC tablet TAKE (1) TABLET BY MOUTH ONCE DAILY. 30 tablet 11  . bisacodyl (DULCOLAX) 10 MG  suppository 1 suppository rectally  Daily every 3 days as needed for constipation 12 suppository 2  . Blood Glucose Calibration (ACCU-CHEK SMARTVIEW CONTROL) LIQD Use to monitor FSBS 1 time daily. DX: 250.00 3 each 3  . Blood Glucose Monitoring Suppl (ACCU-CHEK NANO SMARTVIEW) W/DEVICE KIT Use to monitor FSBS 1 time daily. DX: 250.00 1 kit 1  . cinacalcet (SENSIPAR) 30 MG tablet Take 30 mg by mouth daily.    . citalopram (CELEXA) 20 MG tablet Take 1 tablet (20 mg total) by mouth every morning. 30 tablet 5  . docusate sodium (COLACE) 100 MG capsule Take 1 capsule (100 mg total) by mouth 2 (two) times daily. 60 capsule 11  . etodolac (LODINE) 400 MG tablet Take 400 mg by mouth every 12 (twelve) hours as needed for mild pain.    Marland Kitchen glucose blood (ACCU-CHEK SMARTVIEW) test strip Use to monitor FSBS 1 time daily. DX: 250.00 100 each 3  . ipratropium (ATROVENT) 0.02 % nebulizer solution Take 0.5 mg by nebulization 3 (three) times daily.    . isosorbide mononitrate (IMDUR) 60 MG 24 hr tablet Take 1 tablet (60 mg total) by mouth daily. 30 tablet 5  . Lancets Misc. (ACCU-CHEK FASTCLIX LANCET) KIT Use to monitor FSBS 1 time daily. DX: 250.00 1 kit 11  . levothyroxine (SYNTHROID, LEVOTHROID) 25 MCG tablet TAKE (1) TABLET BY MOUTH ONCE DAILY BEFORE BREAKFAST. 31 tablet 0  . lidocaine-prilocaine (EMLA) cream Apply topically as needed. Apply to L upper arm (1) hour prior to dialysis on MWF. Wrap with plastic wrap after applying. 30 g 3  . mirtazapine (REMERON) 15 MG  tablet Take 0.5 tablets (7.5 mg total) by mouth at bedtime. 15 tablet 6  . Multiple Vitamin (DAILY VITE) TABS Take 1 tablet by mouth daily. 30 tablet 11  . nitroGLYCERIN (NITROSTAT) 0.4 MG SL tablet Place 0.4 mg under the tongue every 5 (five) minutes as needed for chest pain.    Vladimir Faster Glycol-Propyl Glycol (SYSTANE OP) Apply 1 drop to eye 4 (four) times daily as needed (dry eyes).     . polyethylene glycol powder (MIRALAX) powder Take 17 g by  mouth daily. Hold for diarrhea 255 g 6  . ranitidine (ZANTAC) 150 MG tablet Take 1 tablet (150 mg total) by mouth at bedtime. 30 tablet 6  . sevelamer (RENVELA) 800 MG tablet Take 800-2,400 mg by mouth See admin instructions. 3 tabs with each meal and 1tab twice daily with snacks    . traMADol (ULTRAM) 50 MG tablet Take 1 tablet (50 mg total) by mouth 2 (two) times daily as needed. 45 tablet 2  . travoprost, benzalkonium, (TRAVATAN) 0.004 % ophthalmic solution Place 1 drop into both eyes at bedtime. Reported on 05/22/2015    . UNABLE TO FIND Dispense one rolling walker with seat attachment 1 each 0   No current facility-administered medications for this visit.    Past Medical History  Diagnosis Date  . Anxiety   . Diabetes mellitus, type 2 ([place])   . Hypertension   . IBS (irritable bowel syndrome)     with costipation  . Overactive bladder   . Cataracts, both eyes   . Gynecologic cancer   . Hyperlipidemia   . Anemia in CKD (chronic kidney disease)   . SVT (supraventricular tachycardia) ([place])   . CHF (congestive heart failure) ([place])   . Arthritis     gout  . SOB (shortness of breath)     nml spirometry 09/05/06  . Stroke [facility])     residual right side weakness and pain  . Thyroid disease   . Depression   . GERD (gastroesophageal reflux disease)   . CKD (chronic kidney disease), stage V ([place])     on HD    Past Surgical History  Procedure Laterality Date  . Vesicovaginal fistula closure w/ tah    . Av fistula placement, brachiocephalic  87/86/7672    left arm   by Dr. Bridgett Larsson  . Tee without cardioversion  2011    2/2 Strep Bovis infection  . Peripheral vascular catheterization Left 05/15/2015    Procedure: Fistulagram;  Surgeon: Conrad Bellflower, MD;  Location: [facility];  Service: Cardiovascular;  Laterality: Left;    Social History   Social History  . Marital Status: Widowed    Spouse Name: N/A  . Number of Children: N/A  . Years of Education: N/A   Occupational  History  . Not on file.   Social History Main Topics  . Smoking status: Never Smoker   . Smokeless tobacco: Never Used  . Alcohol Use: No  . Drug Use: No  . Sexual Activity: No   Other Topics Concern  . Not on file   Social History Narrative     No family history of premature CAD in 1st degree relatives.  Prior to Admission medications   Medication Sig Start Date End Date Taking? Authorizing Provider  acetaminophen (TYLENOL) 325 MG tablet Take 650 mg by mouth every 6 (six) hours as needed.    Historical Provider, MD  albuterol (PROVENTIL HFA;VENTOLIN HFA) 108 (90 Base) MCG/ACT inhaler Inhale 2  puffs into the lungs every 4 (four) hours as needed. 03/12/15   Alycia Rossetti, MD  albuterol (PROVENTIL) (2.5 MG/3ML) 0.083% nebulizer solution Take 2.5 mg by nebulization every 4 (four) hours as needed for wheezing or shortness of breath.    Historical Provider, MD  amLODipine (NORVASC) 10 MG tablet Take 1 tablet (10 mg total) by mouth every morning. 04/30/15 11/25/17  Alycia Rossetti, MD  aspirin (ASPIRIN LOW DOSE) 81 MG EC tablet TAKE (1) TABLET BY MOUTH ONCE DAILY. 10/10/14   Alycia Rossetti, MD  bisacodyl (DULCOLAX) 10 MG suppository 1 suppository rectally  Daily every 3 days as needed for constipation 03/21/12   Alycia Rossetti, MD  Blood Glucose Calibration (ACCU-CHEK SMARTVIEW CONTROL) LIQD Use to monitor FSBS 1 time daily. DX: 250.00 08/15/13   Alycia Rossetti, MD  Blood Glucose Monitoring Suppl (ACCU-CHEK NANO SMARTVIEW) W/DEVICE KIT Use to monitor FSBS 1 time daily. DX: 250.00 08/15/13   Alycia Rossetti, MD  cinacalcet (SENSIPAR) 30 MG tablet Take 30 mg by mouth daily.    Historical Provider, MD  citalopram (CELEXA) 20 MG tablet Take 1 tablet (20 mg total) by mouth every morning. 04/30/15   Alycia Rossetti, MD  docusate sodium (COLACE) 100 MG capsule Take 1 capsule (100 mg total) by mouth 2 (two) times daily. 11/06/14   Alycia Rossetti, MD  etodolac (LODINE) 400 MG tablet Take 400 mg  by mouth every 12 (twelve) hours as needed for mild pain.    Historical Provider, MD  glucose blood (ACCU-CHEK SMARTVIEW) test strip Use to monitor FSBS 1 time daily. DX: 250.00 08/15/13   Alycia Rossetti, MD  ipratropium (ATROVENT) 0.02 % nebulizer solution Take 0.5 mg by nebulization 3 (three) times daily.    Historical Provider, MD  isosorbide mononitrate (IMDUR) 60 MG 24 hr tablet Take 1 tablet (60 mg total) by mouth daily. 04/30/15 11/25/17  Alycia Rossetti, MD  Lancets Misc. (ACCU-CHEK FASTCLIX LANCET) KIT Use to monitor FSBS 1 time daily. DX: 250.00 08/15/13   Alycia Rossetti, MD  levothyroxine (SYNTHROID, LEVOTHROID) 25 MCG tablet TAKE (1) TABLET BY MOUTH ONCE DAILY BEFORE BREAKFAST. 05/16/15   Susy Frizzle, MD  lidocaine-prilocaine (EMLA) cream Apply topically as needed. Apply to L upper arm (1) hour prior to dialysis on MWF. Wrap with plastic wrap after applying. 02/28/15   Alycia Rossetti, MD  mirtazapine (REMERON) 15 MG tablet Take 0.5 tablets (7.5 mg total) by mouth at bedtime. 02/14/15   Alycia Rossetti, MD  Multiple Vitamin (DAILY VITE) TABS Take 1 tablet by mouth daily. 11/06/14   Alycia Rossetti, MD  nitroGLYCERIN (NITROSTAT) 0.4 MG SL tablet Place 0.4 mg under the tongue every 5 (five) minutes as needed for chest pain.    Historical Provider, MD  Polyethyl Glycol-Propyl Glycol (SYSTANE OP) Apply 1 drop to eye 4 (four) times daily as needed (dry eyes).     Historical Provider, MD  polyethylene glycol powder (MIRALAX) powder Take 17 g by mouth daily. Hold for diarrhea 02/28/15   Alycia Rossetti, MD  ranitidine (ZANTAC) 150 MG tablet Take 1 tablet (150 mg total) by mouth at bedtime. 02/20/15 03/05/17  Alycia Rossetti, MD  sevelamer (RENVELA) 800 MG tablet Take 800-2,400 mg by mouth See admin instructions. 3 tabs with each meal and 1tab twice daily with snacks    Historical Provider, MD  traMADol (ULTRAM) 50 MG tablet Take 1 tablet (50 mg total) by mouth 2 (two)  times daily as needed.  03/12/15   Alycia Rossetti, MD  travoprost, benzalkonium, (TRAVATAN) 0.004 % ophthalmic solution Place 1 drop into both eyes at bedtime.    Historical Provider, MD  [place] one rolling walker with seat attachment 11/29/14   Alycia Rossetti, MD     Review of systems complete and found to be negative unless listed above in HPI     Physical exam Blood pressure 120/70, pulse 79, height '5\' 3"'  (1.6 m), weight 181 lb (82.101 kg), last menstrual period 12/24/2010, SpO2 96 %. General: NAD Neck: No JVD, no thyromegaly or thyroid nodule.  Lungs: Clear to auscultation bilaterally with normal respiratory effort. CV: Nondisplaced PMI. Regular rate and rhythm with one paroxysm of a regular tachycardia, normal S1/S2, no S3/S4, 2/6 ejection systolic murmur over RUSB and along left sternal border.  No peripheral edema.   Abdomen: Soft, nontender, no distention.  Skin: Intact without lesions or rashes.  Neurologic: Alert and oriented.  Psych: Normal affect. Extremities: No clubbing or cyanosis.  HEENT: Normal.   ECG: Most recent ECG reviewed.  Labs:   Lab Results  Component Value Date   WBC 6.2 04/22/2015   HGB 12.6 05/15/2015   HCT 37.0 05/15/2015   MCV 81.5 04/22/2015   PLT 179 04/22/2015   No results for input(s): NA, K, CL, CO2, BUN, CREATININE, CALCIUM, PROT, BILITOT, ALKPHOS, ALT, AST, GLUCOSE in the last 168 hours.  Invalid input(s): LABALBU Lab Results  Component Value Date   CKTOTAL 72 02/01/2011   CKMB 2.0 02/01/2011   TROPONINI <0.30 02/01/2011    Lab Results  Component Value Date   CHOL 136 02/12/2014   CHOL 128 03/29/2013   CHOL 122 01/18/2012   Lab Results  Component Value Date   HDL 54 02/12/2014   HDL 50 03/29/2013   HDL 51 01/18/2012   Lab Results  Component Value Date   LDLCALC 67 02/12/2014   LDLCALC 66 03/29/2013   LDLCALC 57 01/18/2012   Lab Results  Component Value Date   TRIG 75 02/12/2014   TRIG 58 03/29/2013   TRIG 68 01/18/2012     Lab Results  Component Value Date   CHOLHDL 2.5 02/12/2014   CHOLHDL 2.6 03/29/2013   CHOLHDL 2.9 12/09/2010   No results found for: LDLDIRECT       Studies: No results found.  ASSESSMENT AND PLAN:  1. Chronic diastolic heart failure: Euvolemic. Volume management per dialysis.  2. Essential HTN: Controlled. No changes.  3. Preoperative risk stratification:I we'll obtain a nuclear myocardial perfusion study for more accurate perioperative risk stratification.  4. Murmur: Will obtain echo.  5. Chest pain: Higher statistical risk for ischemic heart disease given concomitant history of end-stage renal disease on hemodialysis, hypertension, and hyperlipidemia. I will proceed with a nuclear myocardial perfusion imaging study (Lexiscan) to evaluate for ischemic heart disease.   Dispo: fu 6 months.   Signed: Kate Sable, M.D., F.A.C.C.  05/22/2015, 1:16 PM

## 2015-05-22 NOTE — Patient Instructions (Signed)
Your physician has requested that you have an echocardiogram. Echocardiography is a painless test that uses sound waves to create images of your heart. It provides your doctor with information about the size and shape of your heart and how well your heart's chambers and valves are working. This procedure takes approximately one hour. There are no restrictions for this procedure. Your physician has requested that you have a lexiscan myoview. For further information please visit [email or web address]. Please follow instruction sheet, as given. Office will contact with results via phone or letter.   Continue all current medications. Your physician wants you to follow up in: 6 months.  You will receive a reminder letter in the mail one-two months in advance.  If you don't receive a letter, please call our office to schedule the follow up appointment

## 2015-05-22 NOTE — Patient Outreach (Signed)
[facility]) Care Management  05/22/2015  [name] Jun 03, 1925 [id number]   Received referral from care management assistant via phone request from patient to  return call.  Telephone call to patient; receptionist at [facility] in [place] answered call and advised that patient was not in room. Stated she would take message and have patient return call.   Plan: Will follow up with patient.  Sherrin Daisy, RN BSN [place] Management Coordinator [facility] Care Management  [phone]

## 2015-05-23 ENCOUNTER — Encounter: Payer: Self-pay | Admitting: Vascular Surgery

## 2015-05-23 ENCOUNTER — Ambulatory Visit: Payer: Medicare HMO | Admitting: Vascular Surgery

## 2015-05-23 ENCOUNTER — Other Ambulatory Visit: Payer: Self-pay | Admitting: *Deleted

## 2015-05-23 DIAGNOSIS — N186 End stage renal disease: Secondary | ICD-10-CM | POA: Diagnosis not present

## 2015-05-23 DIAGNOSIS — F0390 Unspecified dementia without behavioral disturbance: Secondary | ICD-10-CM | POA: Diagnosis not present

## 2015-05-23 DIAGNOSIS — Z992 Dependence on renal dialysis: Secondary | ICD-10-CM | POA: Diagnosis not present

## 2015-05-23 NOTE — Patient Outreach (Signed)
[place] [facility]) Care Management  05/23/2015  [name] 1925-05-07 [id number]  Telephone call to patient; left message on voice mail requesting return call.  Plan Will follow up.  Sherrin Daisy, RN BSN [name] [facility] Care Management  [phone]

## 2015-05-24 DIAGNOSIS — F0390 Unspecified dementia without behavioral disturbance: Secondary | ICD-10-CM | POA: Diagnosis not present

## 2015-05-25 DIAGNOSIS — F0390 Unspecified dementia without behavioral disturbance: Secondary | ICD-10-CM | POA: Diagnosis not present

## 2015-05-26 ENCOUNTER — Encounter: Payer: Self-pay | Admitting: *Deleted

## 2015-05-26 ENCOUNTER — Other Ambulatory Visit: Payer: Self-pay | Admitting: *Deleted

## 2015-05-26 DIAGNOSIS — N186 End stage renal disease: Secondary | ICD-10-CM | POA: Diagnosis not present

## 2015-05-26 DIAGNOSIS — F0390 Unspecified dementia without behavioral disturbance: Secondary | ICD-10-CM | POA: Diagnosis not present

## 2015-05-26 DIAGNOSIS — Z992 Dependence on renal dialysis: Secondary | ICD-10-CM | POA: Diagnosis not present

## 2015-05-26 NOTE — Patient Outreach (Signed)
[place] [facility]) Care Management  05/26/2015  NALANY LAUB [birth year] [id number]   Telephone call to patient; left message on mobile phone.  Plan: will follow up  Will send outreach letter.  Sherrin Daisy, RN BSN [place] Management Coordinator [facility] Care Management  [phone]

## 2015-05-27 ENCOUNTER — Ambulatory Visit: Payer: Commercial Managed Care - HMO | Admitting: *Deleted

## 2015-05-27 ENCOUNTER — Ambulatory Visit (HOSPITAL_COMMUNITY)
Admission: RE | Admit: 2015-05-27 | Discharge: 2015-05-27 | Disposition: A | Discharge status: Home / Self Care | Payer: Commercial Managed Care - HMO | Source: Ambulatory Visit | Attending: Cardiovascular Disease | Admitting: Cardiovascular Disease

## 2015-05-27 ENCOUNTER — Encounter (HOSPITAL_COMMUNITY)
Admission: RE | Admit: 2015-05-27 | Discharge: 2015-05-27 | Disposition: A | Discharge status: Home / Self Care | Payer: Commercial Managed Care - HMO | Source: Ambulatory Visit | Attending: Cardiovascular Disease | Admitting: Cardiovascular Disease

## 2015-05-27 ENCOUNTER — Other Ambulatory Visit: Payer: Self-pay | Admitting: *Deleted

## 2015-05-27 ENCOUNTER — Inpatient Hospital Stay (HOSPITAL_COMMUNITY): Admission: RE | Admit: 2015-05-27 | Payer: Commercial Managed Care - HMO | Source: Ambulatory Visit

## 2015-05-27 ENCOUNTER — Encounter (HOSPITAL_COMMUNITY): Payer: Self-pay

## 2015-05-27 DIAGNOSIS — E1122 Type 2 diabetes mellitus with diabetic chronic kidney disease: Secondary | ICD-10-CM | POA: Insufficient documentation

## 2015-05-27 DIAGNOSIS — I350 Nonrheumatic aortic (valve) stenosis: Secondary | ICD-10-CM | POA: Insufficient documentation

## 2015-05-27 DIAGNOSIS — F0390 Unspecified dementia without behavioral disturbance: Secondary | ICD-10-CM | POA: Diagnosis not present

## 2015-05-27 DIAGNOSIS — R079 Chest pain, unspecified: Secondary | ICD-10-CM | POA: Diagnosis not present

## 2015-05-27 DIAGNOSIS — I509 Heart failure, unspecified: Secondary | ICD-10-CM | POA: Diagnosis not present

## 2015-05-27 DIAGNOSIS — I5032 Chronic diastolic (congestive) heart failure: Secondary | ICD-10-CM | POA: Diagnosis not present

## 2015-05-27 DIAGNOSIS — N186 End stage renal disease: Secondary | ICD-10-CM | POA: Diagnosis not present

## 2015-05-27 DIAGNOSIS — I132 Hypertensive heart and chronic kidney disease with heart failure and with stage 5 chronic kidney disease, or end stage renal disease: Secondary | ICD-10-CM | POA: Diagnosis not present

## 2015-05-27 DIAGNOSIS — I059 Rheumatic mitral valve disease, unspecified: Secondary | ICD-10-CM | POA: Diagnosis not present

## 2015-05-27 DIAGNOSIS — E785 Hyperlipidemia, unspecified: Secondary | ICD-10-CM | POA: Diagnosis not present

## 2015-05-27 LAB — NM MYOCAR MULTI W/SPECT W/WALL MOTION / EF
CHL CUP NUCLEAR SDS: 2
CHL CUP NUCLEAR SRS: 8
CHL CUP NUCLEAR SSS: 10
LHR: 0.3
LV sys vol: 33 mL
LVDIAVOL: 95 mL (ref 46–106)
NUC STRESS TID: 1.18
Peak HR: 109 {beats}/min
Rest HR: 60 {beats}/min

## 2015-05-27 MED ORDER — SODIUM CHLORIDE 0.9% FLUSH
INTRAVENOUS | Status: AC
  Administered 2015-05-27: 10 mL via INTRAVENOUS
  Filled 2015-05-27: qty 10

## 2015-05-27 MED ORDER — TECHNETIUM TC 99M TETROFOSMIN IV KIT
30.0000 | PACK | Freq: Once | INTRAVENOUS | Status: AC | PRN
  Administered 2015-05-27: 30 via INTRAVENOUS

## 2015-05-27 MED ORDER — REGADENOSON 0.4 MG/5ML IV SOLN
INTRAVENOUS | Status: AC
  Administered 2015-05-27: 0.4 mg via INTRAVENOUS
  Filled 2015-05-27: qty 5

## 2015-05-27 MED ORDER — TECHNETIUM TC 99M TETROFOSMIN IV KIT
10.0000 | PACK | Freq: Once | INTRAVENOUS | Status: AC | PRN
  Administered 2015-05-27: 10 via INTRAVENOUS

## 2015-05-27 NOTE — Patient Outreach (Signed)
[facility] [facility]) Care Management  05/27/2015  KEA TEMKIN October 29, 1925 [id number]  Received incoming call from patient who gave HIPPA verification.   Patient voices that she lives in [place] [facility] [place] in [place], [place].   Patient is requesting resource information for assistance with getting eyeglasses. States her eyeglasses were taken or she lost eyeglasses several weeks ago.  Patient states she does have eye doctor and most recent appointment was last month.   Patient does not know if she has insurance coverage for eye glasses. [employer] Medicare and Medicaid access are listed in chart. Patient voices that she no longer has [employer] and that she cancelled it  States however that she does have insurance. Patient was advised to have family members call customer services for her listed insurances to inquire if she has coverage for eyeglasses. She was also given community resource in [place] and [employer] on Aging to inquire about free or low cost eyeglasses if no coverage. She was also given website address for low cost eyeglasses.    Patient was also referred to Social workers at [employer] and dialysis center if needed.   Patient voices understanding of the resource information that was given. States she has no other needs at this time & thanked Therapist, sports CM for resource information.  Plan: Close case/send to care management assistant.   Sherrin Daisy, RN BSN [place] Management Coordinator [facility] Care Management  [phone]

## 2015-05-28 DIAGNOSIS — E1051 Type 1 diabetes mellitus with diabetic peripheral angiopathy without gangrene: Secondary | ICD-10-CM | POA: Diagnosis not present

## 2015-05-28 DIAGNOSIS — F0390 Unspecified dementia without behavioral disturbance: Secondary | ICD-10-CM | POA: Diagnosis not present

## 2015-05-28 DIAGNOSIS — R69 Illness, unspecified: Secondary | ICD-10-CM | POA: Diagnosis not present

## 2015-05-28 DIAGNOSIS — N186 End stage renal disease: Secondary | ICD-10-CM | POA: Diagnosis not present

## 2015-05-28 DIAGNOSIS — Z992 Dependence on renal dialysis: Secondary | ICD-10-CM | POA: Diagnosis not present

## 2015-05-28 DIAGNOSIS — L84 Corns and callosities: Secondary | ICD-10-CM | POA: Diagnosis not present

## 2015-05-29 ENCOUNTER — Ambulatory Visit: Payer: Self-pay | Admitting: *Deleted

## 2015-05-29 ENCOUNTER — Encounter: Payer: Self-pay | Admitting: Vascular Surgery

## 2015-05-29 ENCOUNTER — Ambulatory Visit (INDEPENDENT_AMBULATORY_CARE_PROVIDER_SITE_OTHER): Payer: Commercial Managed Care - HMO | Admitting: Vascular Surgery

## 2015-05-29 ENCOUNTER — Other Ambulatory Visit: Payer: Self-pay

## 2015-05-29 ENCOUNTER — Other Ambulatory Visit: Payer: Commercial Managed Care - HMO

## 2015-05-29 VITALS — BP 140/72 | HR 74 | Temp 98.0°F | Resp 16 | Ht 64.0 in | Wt 182.0 lb

## 2015-05-29 DIAGNOSIS — F0390 Unspecified dementia without behavioral disturbance: Secondary | ICD-10-CM | POA: Diagnosis not present

## 2015-05-29 DIAGNOSIS — T82898D Other specified complication of vascular prosthetic devices, implants and grafts, subsequent encounter: Secondary | ICD-10-CM | POA: Diagnosis not present

## 2015-05-29 DIAGNOSIS — T82898A Other specified complication of vascular prosthetic devices, implants and grafts, initial encounter: Secondary | ICD-10-CM | POA: Insufficient documentation

## 2015-05-29 DIAGNOSIS — T82510A Breakdown (mechanical) of surgically created arteriovenous fistula, initial encounter: Secondary | ICD-10-CM | POA: Insufficient documentation

## 2015-05-29 DIAGNOSIS — T82510D Breakdown (mechanical) of surgically created arteriovenous fistula, subsequent encounter: Secondary | ICD-10-CM

## 2015-05-29 NOTE — Progress Notes (Signed)
  Established Dialysis Access  History of Present Illness  [name] is a 82 y.o. (03/05/1925) female who presents for follow from cardiac evaluation.  Pt was seen by Cardiology recently after nuclear stress testing: negative.  The patient recently underwent left arm fistulogram on 05/15/15.  Based on the imaging, I recommended: plication of left brachiocephalic arteriovenous fistula pseudoaneurysm with possible revision of the fistula distal to the pseudoaneurysm.  Given this patient's significant cardiac history.  I sent her to Cardiology for preoperative work-up.  Past Medical History  Diagnosis Date  . Anxiety   . Diabetes mellitus, type 2 (HCC)   . Hypertension   . IBS (irritable bowel syndrome)     with costipation  . Overactive bladder   . Cataracts, both eyes   . Gynecologic cancer   . Hyperlipidemia   . Anemia in CKD (chronic kidney disease)   . SVT (supraventricular tachycardia) (HCC)   . CHF (congestive heart failure) (HCC)   . Arthritis     gout  . SOB (shortness of breath)     nml spirometry 09/05/06  . Stroke (HCC)     residual right side weakness and pain  . Thyroid disease   . Depression   . GERD (gastroesophageal reflux disease)   . CKD (chronic kidney disease), stage V (HCC)     on HD    Past Surgical History  Procedure Laterality Date  . Vesicovaginal fistula closure w/ tah    . Av fistula placement, brachiocephalic  05/19/2010    left arm   by Dr. Shaylynn Nulty  . Tee without cardioversion  2011    2/2 Strep Bovis infection  . Peripheral vascular catheterization Left 05/15/2015    Procedure: Fistulagram;  Surgeon: Trent Theisen L Hildegarde Dunaway, MD;  Location: [facility];  Service: Cardiovascular;  Laterality: Left;    Social History   Social History  . Marital Status: Widowed    Spouse Name: N/A  . Number of Children: N/A  . Years of Education: N/A   Occupational History  . Not on file.   Social History Main Topics  . Smoking status: Never Smoker   .  Smokeless tobacco: Never Used  . Alcohol Use: No  . Drug Use: No  . Sexual Activity: No   Other Topics Concern  . Not on file   Social History Narrative   Family History: patient is unable to detail the medical history of his parents  Current Outpatient Prescriptions  Medication Sig Dispense Refill  . acetaminophen (TYLENOL) 325 MG tablet Take 650 mg by mouth every 6 (six) hours as needed.    . albuterol (PROVENTIL HFA;VENTOLIN HFA) 108 (90 Base) MCG/ACT inhaler Inhale 2 puffs into the lungs every 4 (four) hours as needed. 18 g 6  . albuterol (PROVENTIL) (2.5 MG/3ML) 0.083% nebulizer solution Take 2.5 mg by nebulization every 4 (four) hours as needed for wheezing or shortness of breath.    . amLODipine (NORVASC) 10 MG tablet Take 1 tablet (10 mg total) by mouth every morning. 30 tablet 5  . aspirin (ASPIRIN LOW DOSE) 81 MG EC tablet TAKE (1) TABLET BY MOUTH ONCE DAILY. 30 tablet 11  . bisacodyl (DULCOLAX) 10 MG suppository 1 suppository rectally  Daily every 3 days as needed for constipation 12 suppository 2  . Blood Glucose Calibration (ACCU-CHEK SMARTVIEW CONTROL) LIQD Use to monitor FSBS 1 time daily. DX: 250.00 3 each 3  . Blood Glucose Monitoring Suppl (ACCU-CHEK NANO SMARTVIEW) W/DEVICE KIT Use to   monitor FSBS 1 time daily. DX: 250.00 1 kit 1  . cinacalcet (SENSIPAR) 30 MG tablet Take 30 mg by mouth daily.    . citalopram (CELEXA) 20 MG tablet Take 1 tablet (20 mg total) by mouth every morning. 30 tablet 5  . docusate sodium (COLACE) 100 MG capsule Take 1 capsule (100 mg total) by mouth 2 (two) times daily. 60 capsule 11  . etodolac (LODINE) 400 MG tablet Take 400 mg by mouth every 12 (twelve) hours as needed for mild pain.    . glucose blood (ACCU-CHEK SMARTVIEW) test strip Use to monitor FSBS 1 time daily. DX: 250.00 100 each 3  . ipratropium (ATROVENT) 0.02 % nebulizer solution Take 0.5 mg by nebulization 3 (three) times daily.    . isosorbide mononitrate (IMDUR) 60 MG 24 hr  tablet Take 1 tablet (60 mg total) by mouth daily. 30 tablet 5  . Lancets Misc. (ACCU-CHEK FASTCLIX LANCET) KIT Use to monitor FSBS 1 time daily. DX: 250.00 1 kit 11  . levothyroxine (SYNTHROID, LEVOTHROID) 25 MCG tablet TAKE (1) TABLET BY MOUTH ONCE DAILY BEFORE BREAKFAST. 31 tablet 0  . lidocaine-prilocaine (EMLA) cream Apply topically as needed. Apply to L upper arm (1) hour prior to dialysis on MWF. Wrap with plastic wrap after applying. 30 g 3  . mirtazapine (REMERON) 15 MG tablet Take 0.5 tablets (7.5 mg total) by mouth at bedtime. 15 tablet 6  . Multiple Vitamin (DAILY VITE) TABS Take 1 tablet by mouth daily. 30 tablet 11  . nitroGLYCERIN (NITROSTAT) 0.4 MG SL tablet Place 0.4 mg under the tongue every 5 (five) minutes as needed for chest pain.    . Polyethyl Glycol-Propyl Glycol (SYSTANE OP) Apply 1 drop to eye 4 (four) times daily as needed (dry eyes).     . polyethylene glycol powder (MIRALAX) powder Take 17 g by mouth daily. Hold for diarrhea 255 g 6  . ranitidine (ZANTAC) 150 MG tablet Take 1 tablet (150 mg total) by mouth at bedtime. 30 tablet 6  . sevelamer (RENVELA) 800 MG tablet Take 800-2,400 mg by mouth See admin instructions. 3 tabs with each meal and 1tab twice daily with snacks    . traMADol (ULTRAM) 50 MG tablet Take 1 tablet (50 mg total) by mouth 2 (two) times daily as needed. 45 tablet 2  . travoprost, benzalkonium, (TRAVATAN) 0.004 % ophthalmic solution Place 1 drop into both eyes at bedtime. Reported on 05/22/2015    . UNABLE TO FIND Dispense one rolling walker with seat attachment 1 each 0   No current facility-administered medications for this visit.     No Known Allergies   REVIEW OF SYSTEMS:  (Positives checked otherwise negative)  CARDIOVASCULAR:   [ ] chest pain,  [ ] chest pressure,  [ ] palpitations,  [ ] shortness of breath when laying flat,  [ ] shortness of breath with exertion,   [ ] pain in feet when walking,  [ ] pain in feet when laying flat, [  ] history of blood clot in veins (DVT),  [ ] history of phlebitis,  [ ] swelling in legs,  [ ] varicose veins  PULMONARY:   [ ] productive cough,  [ ] asthma,  [ ] wheezing  NEUROLOGIC:   [ ] weakness in arms or legs,  [ ] numbness in arms or legs,  [ ] difficulty speaking or slurred speech,  [ ] temporary loss of vision in one eye,  [ ] dizziness  HEMATOLOGIC:   [ ]   bleeding problems,  [ ] problems with blood clotting too easily  MUSCULOSKEL:   [ ] joint pain, [ ] joint swelling  GASTROINTEST:   [ ] vomiting blood,  [ ] blood in stool     GENITOURINARY:   [ ] burning with urination,  [ ] blood in urine [x] ESRD-HD: T/R/S  PSYCHIATRIC:   [ ] history of major depression  INTEGUMENTARY:   [ ] rashes,  [ ] ulcers  CONSTITUTIONAL:   [ ] fever,  [ ] chills   Physical Examination  Filed Vitals:   05/29/15 1507 05/29/15 1510  BP: 155/77 140/72  Pulse: 86 74  Temp: 98 F (36.7 C)   Resp: 16   Height: 5' 4" (1.626 m)   Weight: 182 lb (82.555 kg)   SpO2: 98%    Body mass index is 31.22 kg/(m^2).  General: A&O x 3, WD, WN  Pulmonary: Sym exp, good air movt, CTAB, no rales, rhonchi, & wheezing  Cardiac: RRR, Nl S1, S2, no Murmurs, rubs or gallops  Vascular: Vessel Right Left  Radial Palpable Not Palpable  Ulnar Not Palpable Not Palpable  Brachial Palpable Palpable   Gastrointestinal: soft, NTND, no G/R, bo HSM, no masses, no CVAT B  Musculoskeletal: M/S 5/5 throughout , Extremities without  ischemic changes , palpable thrill in access in L BC AVF with pulsatile character, + bruit in access, attenuate skin overlying pseudoaneurysm  Neurologic: Pain and light touch intact in extremities , Motor exam as listed above   Medical Decision Making  Shawny F Boxell is a 82 y.o. female who presents with ESRD requiring hemodialysis, L BC AVF PSA   I recommended: plication of left brachiocephalic arteriovenous fistula pseudoaneurysm with possible revision  of the fistula distal to the pseudoaneurysm. Risk, benefits, and alternatives to access surgery were discussed.   The patient is aware the risks include but are not limited to: bleeding, infection, steal syndrome, nerve damage, ischemic monomelic neuropathy, failure to mature, need for additional procedures, death and stroke.    The patient agrees to proceed forward with the procedure.  She is scheduled for 30 MAY 17.   Orhan Mayorga, MD [facility] Office: [phone] Pager: [phone]  05/29/2015, 3:52 PM    

## 2015-05-30 DIAGNOSIS — N186 End stage renal disease: Secondary | ICD-10-CM | POA: Diagnosis not present

## 2015-05-30 DIAGNOSIS — F0390 Unspecified dementia without behavioral disturbance: Secondary | ICD-10-CM | POA: Diagnosis not present

## 2015-05-30 DIAGNOSIS — Z992 Dependence on renal dialysis: Secondary | ICD-10-CM | POA: Diagnosis not present

## 2015-05-31 DIAGNOSIS — F0390 Unspecified dementia without behavioral disturbance: Secondary | ICD-10-CM | POA: Diagnosis not present

## 2015-06-01 DIAGNOSIS — F0390 Unspecified dementia without behavioral disturbance: Secondary | ICD-10-CM | POA: Diagnosis not present

## 2015-06-02 DIAGNOSIS — N186 End stage renal disease: Secondary | ICD-10-CM | POA: Diagnosis not present

## 2015-06-02 DIAGNOSIS — Z992 Dependence on renal dialysis: Secondary | ICD-10-CM | POA: Diagnosis not present

## 2015-06-02 DIAGNOSIS — F0390 Unspecified dementia without behavioral disturbance: Secondary | ICD-10-CM | POA: Diagnosis not present

## 2015-06-03 DIAGNOSIS — F0390 Unspecified dementia without behavioral disturbance: Secondary | ICD-10-CM | POA: Diagnosis not present

## 2015-06-04 DIAGNOSIS — Z992 Dependence on renal dialysis: Secondary | ICD-10-CM | POA: Diagnosis not present

## 2015-06-04 DIAGNOSIS — N186 End stage renal disease: Secondary | ICD-10-CM | POA: Diagnosis not present

## 2015-06-04 DIAGNOSIS — N2581 Secondary hyperparathyroidism of renal origin: Secondary | ICD-10-CM | POA: Diagnosis not present

## 2015-06-04 DIAGNOSIS — F0390 Unspecified dementia without behavioral disturbance: Secondary | ICD-10-CM | POA: Diagnosis not present

## 2015-06-05 DIAGNOSIS — F0390 Unspecified dementia without behavioral disturbance: Secondary | ICD-10-CM | POA: Diagnosis not present

## 2015-06-06 ENCOUNTER — Encounter (HOSPITAL_COMMUNITY): Payer: Self-pay | Admitting: *Deleted

## 2015-06-06 ENCOUNTER — Other Ambulatory Visit: Payer: Self-pay | Admitting: Family Medicine

## 2015-06-06 DIAGNOSIS — F0390 Unspecified dementia without behavioral disturbance: Secondary | ICD-10-CM | POA: Diagnosis not present

## 2015-06-06 DIAGNOSIS — N186 End stage renal disease: Secondary | ICD-10-CM | POA: Diagnosis not present

## 2015-06-06 DIAGNOSIS — Z992 Dependence on renal dialysis: Secondary | ICD-10-CM | POA: Diagnosis not present

## 2015-06-06 NOTE — Telephone Encounter (Signed)
Medication refilled per protocol. 

## 2015-06-06 NOTE — Progress Notes (Signed)
Left message on pt's son's voicemail with pre-op instructions. Informed him of time of arrival for pt and where to come.

## 2015-06-06 NOTE — Progress Notes (Signed)
Pt has cardiac clearance from Dr. Jacinta Shoe for surgery. She denies any recent chest pain. Pt is diabetic but is no longer on medications.  Pt is a resident at [facility]. I called and spoke with Lovena Le, med tech for the facility and gave her the medications that [name] needs to take the morning of surgery.

## 2015-06-07 DIAGNOSIS — F0390 Unspecified dementia without behavioral disturbance: Secondary | ICD-10-CM | POA: Diagnosis not present

## 2015-06-08 DIAGNOSIS — F0390 Unspecified dementia without behavioral disturbance: Secondary | ICD-10-CM | POA: Diagnosis not present

## 2015-06-08 MED ORDER — DEXTROSE 5 % IV SOLN
1.5000 g | INTRAVENOUS | Status: AC
  Administered 2015-06-10: 1.5 g via INTRAVENOUS
  Filled 2015-06-08: qty 1.5

## 2015-06-09 DIAGNOSIS — N186 End stage renal disease: Secondary | ICD-10-CM | POA: Diagnosis not present

## 2015-06-09 DIAGNOSIS — F0390 Unspecified dementia without behavioral disturbance: Secondary | ICD-10-CM | POA: Diagnosis not present

## 2015-06-09 DIAGNOSIS — Z992 Dependence on renal dialysis: Secondary | ICD-10-CM | POA: Diagnosis not present

## 2015-06-09 NOTE — Anesthesia Preprocedure Evaluation (Addendum)
Anesthesia Evaluation  Patient identified by MRN, date of birth, ID band Patient awake    Reviewed: Allergy & Precautions, NPO status , Patient's Chart, lab work & pertinent test results  Airway Mallampati: III  TM Distance: >3 FB Neck ROM: Full    Dental  (+) Poor Dentition, Dental Advisory Given   Pulmonary    breath sounds clear to auscultation       Cardiovascular hypertension, Pt. on medications +CHF  + dysrhythmias Supra Ventricular Tachycardia  Rhythm:Regular Rate:Normal     Neuro/Psych PSYCHIATRIC DISORDERS Anxiety Depression  Neuromuscular disease CVA    GI/Hepatic GERD  Medicated,  Endo/Other  diabetes, Type 2Hypothyroidism   Renal/GU ESRFRenal disease     Musculoskeletal  (+) Arthritis ,   Abdominal   Peds  Hematology   Anesthesia Other Findings - HLD  Reproductive/Obstetrics                           Lab Results  Component Value Date   WBC 6.2 04/22/2015   HGB 12.6 05/15/2015   HCT 37.0 05/15/2015   MCV 81.5 04/22/2015   PLT 179 04/22/2015   Lab Results  Component Value Date   CREATININE 5.90* 05/15/2015   BUN 39* 05/15/2015   NA 137 05/15/2015   K 4.6 05/15/2015   CL 94* 05/15/2015   CO2 29 03/29/2013   EKG: SR  05/2015 Echo - Left ventricle: The cavity size was normal. Wall thickness was increased in a pattern of moderate LVH. Systolic function was normal. The estimated ejection fraction was in the range of 60% to 65%. Wall motion was normal; there were no regional wall motion abnormalities. Doppler parameters are consistent with abnormal left ventricular relaxation (grade 1 diastolic dysfunction). Doppler parameters are consistent with high ventricular filling pressure. - Aortic valve: Morphologically, there is at least mild aortic stenosis if not mild to moderate. Moderately calcified annulus. Mildly thickened, mildly calcified leaflets. Cusp separation  was reduced. - Mitral valve: Mildly calcified annulus. - Left atrium: The atrium was moderately dilated.     Anesthesia Physical Anesthesia Plan  ASA: III  Anesthesia Plan: General   Post-op Pain Management:    Induction: Intravenous  Airway Management Planned: LMA  Additional Equipment:   Intra-op Plan:   Post-operative Plan: Extubation in OR  Informed Consent: I have reviewed the patients History and Physical, chart, labs and discussed the procedure including the risks, benefits and alternatives for the proposed anesthesia with the patient or authorized representative who has indicated his/her understanding and acceptance.     Plan Discussed with: CRNA  Anesthesia Plan Comments: (Will proceed with MAC if Dr. Bridgett Larsson feels procedure can be performed with assistance of local anesthesia. )       Anesthesia Quick Evaluation

## 2015-06-10 ENCOUNTER — Ambulatory Visit (HOSPITAL_COMMUNITY): Payer: Commercial Managed Care - HMO | Admitting: Anesthesiology

## 2015-06-10 ENCOUNTER — Encounter (HOSPITAL_COMMUNITY)
Admission: RE | Disposition: A | Discharge status: Home / Self Care | Payer: Self-pay | Source: Ambulatory Visit | Attending: Vascular Surgery

## 2015-06-10 ENCOUNTER — Encounter (HOSPITAL_COMMUNITY): Payer: Self-pay | Admitting: Anesthesiology

## 2015-06-10 ENCOUNTER — Ambulatory Visit (HOSPITAL_COMMUNITY)
Admission: RE | Admit: 2015-06-10 | Discharge: 2015-06-10 | Disposition: A | Discharge status: Home / Self Care | Payer: Commercial Managed Care - HMO | Source: Ambulatory Visit | Attending: Vascular Surgery | Admitting: Vascular Surgery

## 2015-06-10 DIAGNOSIS — T82898A Other specified complication of vascular prosthetic devices, implants and grafts, initial encounter: Secondary | ICD-10-CM | POA: Insufficient documentation

## 2015-06-10 DIAGNOSIS — I770 Arteriovenous fistula, acquired: Secondary | ICD-10-CM | POA: Diagnosis not present

## 2015-06-10 DIAGNOSIS — K219 Gastro-esophageal reflux disease without esophagitis: Secondary | ICD-10-CM | POA: Diagnosis not present

## 2015-06-10 DIAGNOSIS — N184 Chronic kidney disease, stage 4 (severe): Secondary | ICD-10-CM | POA: Diagnosis not present

## 2015-06-10 DIAGNOSIS — I132 Hypertensive heart and chronic kidney disease with heart failure and with stage 5 chronic kidney disease, or end stage renal disease: Secondary | ICD-10-CM | POA: Insufficient documentation

## 2015-06-10 DIAGNOSIS — M1990 Unspecified osteoarthritis, unspecified site: Secondary | ICD-10-CM | POA: Diagnosis not present

## 2015-06-10 DIAGNOSIS — F0390 Unspecified dementia without behavioral disturbance: Secondary | ICD-10-CM | POA: Diagnosis not present

## 2015-06-10 DIAGNOSIS — Z992 Dependence on renal dialysis: Secondary | ICD-10-CM | POA: Diagnosis not present

## 2015-06-10 DIAGNOSIS — Y832 Surgical operation with anastomosis, bypass or graft as the cause of abnormal reaction of the patient, or of later complication, without mention of misadventure at the time of the procedure: Secondary | ICD-10-CM | POA: Diagnosis not present

## 2015-06-10 DIAGNOSIS — E039 Hypothyroidism, unspecified: Secondary | ICD-10-CM | POA: Diagnosis not present

## 2015-06-10 DIAGNOSIS — E1122 Type 2 diabetes mellitus with diabetic chronic kidney disease: Secondary | ICD-10-CM | POA: Insufficient documentation

## 2015-06-10 DIAGNOSIS — Z7982 Long term (current) use of aspirin: Secondary | ICD-10-CM | POA: Diagnosis not present

## 2015-06-10 DIAGNOSIS — N186 End stage renal disease: Secondary | ICD-10-CM | POA: Diagnosis not present

## 2015-06-10 DIAGNOSIS — F419 Anxiety disorder, unspecified: Secondary | ICD-10-CM | POA: Diagnosis not present

## 2015-06-10 DIAGNOSIS — I509 Heart failure, unspecified: Secondary | ICD-10-CM | POA: Insufficient documentation

## 2015-06-10 DIAGNOSIS — E785 Hyperlipidemia, unspecified: Secondary | ICD-10-CM | POA: Diagnosis not present

## 2015-06-10 HISTORY — DX: Malignant (primary) neoplasm, unspecified: C80.1

## 2015-06-10 HISTORY — PX: FISTULA SUPERFICIALIZATION: SHX6341

## 2015-06-10 HISTORY — DX: Pneumonia, unspecified organism: J18.9

## 2015-06-10 LAB — GLUCOSE, CAPILLARY
GLUCOSE-CAPILLARY: 116 mg/dL — AB (ref 65–99)
GLUCOSE-CAPILLARY: 102 mg/dL — AB (ref 65–99)

## 2015-06-10 LAB — POCT I-STAT 4, (NA,K, GLUC, HGB,HCT)
GLUCOSE: 100 mg/dL — AB (ref 65–99)
HCT: 34 % — ABNORMAL LOW (ref 36.0–46.0)
HEMOGLOBIN: 11.6 g/dL — AB (ref 12.0–15.0)
Potassium: 5 mmol/L (ref 3.5–5.1)
Sodium: 138 mmol/L (ref 135–145)

## 2015-06-10 SURGERY — FISTULA SUPERFICIALIZATION
Anesthesia: General | Site: Arm Upper | Laterality: Left

## 2015-06-10 MED ORDER — PROTAMINE SULFATE 10 MG/ML IV SOLN
INTRAVENOUS | Status: AC
  Filled 2015-06-10: qty 5

## 2015-06-10 MED ORDER — PHENYLEPHRINE HCL 10 MG/ML IJ SOLN
10.0000 mg | INTRAVENOUS | Status: DC | PRN
  Administered 2015-06-10: 10 ug/min via INTRAVENOUS

## 2015-06-10 MED ORDER — PROPOFOL 10 MG/ML IV BOLUS
INTRAVENOUS | Status: DC | PRN
  Administered 2015-06-10: 140 mg via INTRAVENOUS

## 2015-06-10 MED ORDER — PROPOFOL 10 MG/ML IV BOLUS
INTRAVENOUS | Status: AC
  Filled 2015-06-10: qty 20

## 2015-06-10 MED ORDER — SODIUM CHLORIDE 0.9 % IV SOLN
INTRAVENOUS | Status: DC | PRN
  Administered 2015-06-10: 08:00:00

## 2015-06-10 MED ORDER — FENTANYL CITRATE (PF) 250 MCG/5ML IJ SOLN
INTRAMUSCULAR | Status: AC
  Filled 2015-06-10: qty 5

## 2015-06-10 MED ORDER — LACTATED RINGERS IV SOLN: INTRAVENOUS | Status: DC

## 2015-06-10 MED ORDER — PROPOFOL 500 MG/50ML IV EMUL: INTRAVENOUS | Status: DC | PRN

## 2015-06-10 MED ORDER — LIDOCAINE HCL (CARDIAC) 20 MG/ML IV SOLN
INTRAVENOUS | Status: DC | PRN
  Administered 2015-06-10: 40 mg via INTRATRACHEAL

## 2015-06-10 MED ORDER — SODIUM CHLORIDE 0.9 % IV SOLN
INTRAVENOUS | Status: DC | PRN
  Administered 2015-06-10: 07:00:00 via INTRAVENOUS

## 2015-06-10 MED ORDER — FENTANYL CITRATE (PF) 100 MCG/2ML IJ SOLN: 25.0000 ug | INTRAMUSCULAR | Status: DC | PRN

## 2015-06-10 MED ORDER — MIDAZOLAM HCL 2 MG/2ML IJ SOLN
INTRAMUSCULAR | Status: AC
  Filled 2015-06-10: qty 2

## 2015-06-10 MED ORDER — HEPARIN SODIUM (PORCINE) 1000 UNIT/ML IJ SOLN
INTRAMUSCULAR | Status: AC
  Filled 2015-06-10: qty 1

## 2015-06-10 MED ORDER — ROCURONIUM BROMIDE 50 MG/5ML IV SOLN
INTRAVENOUS | Status: AC
  Filled 2015-06-10: qty 1

## 2015-06-10 MED ORDER — ONDANSETRON HCL 4 MG/2ML IJ SOLN
INTRAMUSCULAR | Status: DC | PRN
  Administered 2015-06-10: 4 mg via INTRAVENOUS

## 2015-06-10 MED ORDER — LIDOCAINE HCL (PF) 1 % IJ SOLN
INTRAMUSCULAR | Status: AC
  Filled 2015-06-10: qty 30

## 2015-06-10 MED ORDER — ONDANSETRON HCL 4 MG/2ML IJ SOLN
INTRAMUSCULAR | Status: AC
Start: 2015-06-10 — End: 2015-06-10
  Filled 2015-06-10: qty 2

## 2015-06-10 MED ORDER — PHENYLEPHRINE HCL 10 MG/ML IJ SOLN
INTRAMUSCULAR | Status: DC | PRN
  Administered 2015-06-10: 40 ug via INTRAVENOUS
  Administered 2015-06-10: 80 ug via INTRAVENOUS
  Administered 2015-06-10: 40 ug via INTRAVENOUS

## 2015-06-10 MED ORDER — HEMOSTATIC AGENTS (NO CHARGE) OPTIME
TOPICAL | Status: DC | PRN
  Administered 2015-06-10: 1 via TOPICAL

## 2015-06-10 MED ORDER — FENTANYL CITRATE (PF) 100 MCG/2ML IJ SOLN
INTRAMUSCULAR | Status: DC | PRN
  Administered 2015-06-10: 25 ug via INTRAVENOUS

## 2015-06-10 MED ORDER — 0.9 % SODIUM CHLORIDE (POUR BTL) OPTIME
TOPICAL | Status: DC | PRN
  Administered 2015-06-10: 1000 mL

## 2015-06-10 MED ORDER — HEPARIN SODIUM (PORCINE) 1000 UNIT/ML IJ SOLN
INTRAMUSCULAR | Status: DC | PRN
  Administered 2015-06-10: 8 mL via INTRAVENOUS

## 2015-06-10 MED ORDER — MEPERIDINE HCL 25 MG/ML IJ SOLN: 6.2500 mg | INTRAMUSCULAR | Status: DC | PRN

## 2015-06-10 MED ORDER — PROTAMINE SULFATE 10 MG/ML IV SOLN
INTRAVENOUS | Status: DC | PRN
  Administered 2015-06-10 (×6): 10 mg via INTRAVENOUS

## 2015-06-10 SURGICAL SUPPLY — 45 items
BANDAGE ESMARK 6X9 LF (GAUZE/BANDAGES/DRESSINGS) ×1 IMPLANT
BNDG CMPR 9X6 STRL LF SNTH (GAUZE/BANDAGES/DRESSINGS) ×1
BNDG ESMARK 6X9 LF (GAUZE/BANDAGES/DRESSINGS) ×3
BNDG GAUZE ELAST 4 BULKY (GAUZE/BANDAGES/DRESSINGS) ×3 IMPLANT
CANISTER SUCTION 2500CC (MISCELLANEOUS) ×3 IMPLANT
CLIP TI MEDIUM 6 (CLIP) ×3 IMPLANT
CLIP TI WIDE RED SMALL 6 (CLIP) ×3 IMPLANT
COVER PROBE W GEL 5X96 (DRAPES) IMPLANT
CUFF TOURNIQUET SINGLE 18IN (TOURNIQUET CUFF) ×3 IMPLANT
DECANTER SPIKE VIAL GLASS SM (MISCELLANEOUS) IMPLANT
DRAIN PENROSE 1/2X12 LTX STRL (WOUND CARE) IMPLANT
ELECT REM PT RETURN 9FT ADLT (ELECTROSURGICAL) ×3
ELECTRODE REM PT RTRN 9FT ADLT (ELECTROSURGICAL) ×1 IMPLANT
GLOVE BIO SURGEON STRL SZ 6 (GLOVE) ×3 IMPLANT
GLOVE BIO SURGEON STRL SZ 6.5 (GLOVE) ×4 IMPLANT
GLOVE BIO SURGEON STRL SZ7 (GLOVE) ×3 IMPLANT
GLOVE BIO SURGEONS STRL SZ 6.5 (GLOVE) ×2
GLOVE BIOGEL PI IND STRL 7.0 (GLOVE) ×2 IMPLANT
GLOVE BIOGEL PI IND STRL 7.5 (GLOVE) ×1 IMPLANT
GLOVE BIOGEL PI INDICATOR 7.0 (GLOVE) ×4
GLOVE BIOGEL PI INDICATOR 7.5 (GLOVE) ×2
GLOVE SURG SS PI 7.0 STRL IVOR (GLOVE) ×3 IMPLANT
GOWN STRL REUS W/ TWL LRG LVL3 (GOWN DISPOSABLE) ×3 IMPLANT
GOWN STRL REUS W/ TWL XL LVL3 (GOWN DISPOSABLE) ×1 IMPLANT
GOWN STRL REUS W/TWL LRG LVL3 (GOWN DISPOSABLE) ×6
GOWN STRL REUS W/TWL XL LVL3 (GOWN DISPOSABLE) ×2
HEMOSTAT SPONGE AVITENE ULTRA (HEMOSTASIS) ×3 IMPLANT
KIT BASIN OR (CUSTOM PROCEDURE TRAY) ×3 IMPLANT
KIT ROOM TURNOVER OR (KITS) ×3 IMPLANT
LIQUID BAND (GAUZE/BANDAGES/DRESSINGS) ×3 IMPLANT
NS IRRIG 1000ML POUR BTL (IV SOLUTION) ×3 IMPLANT
PACK CV ACCESS (CUSTOM PROCEDURE TRAY) ×3 IMPLANT
PAD ARMBOARD 7.5X6 YLW CONV (MISCELLANEOUS) ×6 IMPLANT
SPONGE GAUZE 4X4 12PLY STER LF (GAUZE/BANDAGES/DRESSINGS) ×3 IMPLANT
SPONGE SURGIFOAM ABS GEL 100 (HEMOSTASIS) IMPLANT
STAPLER VISISTAT 35W (STAPLE) ×3 IMPLANT
SUT MNCRL AB 4-0 PS2 18 (SUTURE) ×3 IMPLANT
SUT PROLENE 5 0 C 1 24 (SUTURE) ×9 IMPLANT
SUT PROLENE 6 0 BV (SUTURE) ×3 IMPLANT
SUT PROLENE 7 0 BV 1 (SUTURE) IMPLANT
SUT VIC AB 3-0 SH 27 (SUTURE) ×3
SUT VIC AB 3-0 SH 27X BRD (SUTURE) ×1 IMPLANT
TAPE CLOTH SURG 4X10 WHT LF (GAUZE/BANDAGES/DRESSINGS) ×3 IMPLANT
UNDERPAD 30X30 INCONTINENT (UNDERPADS AND DIAPERS) ×3 IMPLANT
WATER STERILE IRR 1000ML POUR (IV SOLUTION) ×3 IMPLANT

## 2015-06-10 NOTE — H&P (View-Only) (Signed)
  Established Dialysis Access  History of Present Illness  [name] is a 82 y.o. (01/02/1926) female who presents for follow from cardiac evaluation.  Pt was seen by Cardiology recently after nuclear stress testing: negative.  The patient recently underwent left arm fistulogram on 05/15/15.  Based on the imaging, I recommended: plication of left brachiocephalic arteriovenous fistula pseudoaneurysm with possible revision of the fistula distal to the pseudoaneurysm.  Given this patient's significant cardiac history.  I sent her to Cardiology for preoperative work-up.  Past Medical History  Diagnosis Date  . Anxiety   . Diabetes mellitus, type 2 (HCC)   . Hypertension   . IBS (irritable bowel syndrome)     with costipation  . Overactive bladder   . Cataracts, both eyes   . Gynecologic cancer   . Hyperlipidemia   . Anemia in CKD (chronic kidney disease)   . SVT (supraventricular tachycardia) (HCC)   . CHF (congestive heart failure) (HCC)   . Arthritis     gout  . SOB (shortness of breath)     nml spirometry 09/05/06  . Stroke (HCC)     residual right side weakness and pain  . Thyroid disease   . Depression   . GERD (gastroesophageal reflux disease)   . CKD (chronic kidney disease), stage V (HCC)     on HD    Past Surgical History  Procedure Laterality Date  . Vesicovaginal fistula closure w/ tah    . Av fistula placement, brachiocephalic  05/19/2010    left arm   by Dr. Chen  . Tee without cardioversion  2011    2/2 Strep Bovis infection  . Peripheral vascular catheterization Left 05/15/2015    Procedure: Fistulagram;  Surgeon: Brian L Chen, MD;  Location: [facility];  Service: Cardiovascular;  Laterality: Left;    Social History   Social History  . Marital Status: Widowed    Spouse Name: N/A  . Number of Children: N/A  . Years of Education: N/A   Occupational History  . Not on file.   Social History Main Topics  . Smoking status: Never Smoker   .  Smokeless tobacco: Never Used  . Alcohol Use: No  . Drug Use: No  . Sexual Activity: No   Other Topics Concern  . Not on file   Social History Narrative   Family History: patient is unable to detail the medical history of his parents  Current Outpatient Prescriptions  Medication Sig Dispense Refill  . acetaminophen (TYLENOL) 325 MG tablet Take 650 mg by mouth every 6 (six) hours as needed.    . albuterol (PROVENTIL HFA;VENTOLIN HFA) 108 (90 Base) MCG/ACT inhaler Inhale 2 puffs into the lungs every 4 (four) hours as needed. 18 g 6  . albuterol (PROVENTIL) (2.5 MG/3ML) 0.083% nebulizer solution Take 2.5 mg by nebulization every 4 (four) hours as needed for wheezing or shortness of breath.    . amLODipine (NORVASC) 10 MG tablet Take 1 tablet (10 mg total) by mouth every morning. 30 tablet 5  . aspirin (ASPIRIN LOW DOSE) 81 MG EC tablet TAKE (1) TABLET BY MOUTH ONCE DAILY. 30 tablet 11  . bisacodyl (DULCOLAX) 10 MG suppository 1 suppository rectally  Daily every 3 days as needed for constipation 12 suppository 2  . Blood Glucose Calibration (ACCU-CHEK SMARTVIEW CONTROL) LIQD Use to monitor FSBS 1 time daily. DX: 250.00 3 each 3  . Blood Glucose Monitoring Suppl (ACCU-CHEK NANO SMARTVIEW) W/DEVICE KIT Use to   monitor FSBS 1 time daily. DX: 250.00 1 kit 1  . cinacalcet (SENSIPAR) 30 MG tablet Take 30 mg by mouth daily.    . citalopram (CELEXA) 20 MG tablet Take 1 tablet (20 mg total) by mouth every morning. 30 tablet 5  . docusate sodium (COLACE) 100 MG capsule Take 1 capsule (100 mg total) by mouth 2 (two) times daily. 60 capsule 11  . etodolac (LODINE) 400 MG tablet Take 400 mg by mouth every 12 (twelve) hours as needed for mild pain.    . glucose blood (ACCU-CHEK SMARTVIEW) test strip Use to monitor FSBS 1 time daily. DX: 250.00 100 each 3  . ipratropium (ATROVENT) 0.02 % nebulizer solution Take 0.5 mg by nebulization 3 (three) times daily.    . isosorbide mononitrate (IMDUR) 60 MG 24 hr  tablet Take 1 tablet (60 mg total) by mouth daily. 30 tablet 5  . Lancets Misc. (ACCU-CHEK FASTCLIX LANCET) KIT Use to monitor FSBS 1 time daily. DX: 250.00 1 kit 11  . levothyroxine (SYNTHROID, LEVOTHROID) 25 MCG tablet TAKE (1) TABLET BY MOUTH ONCE DAILY BEFORE BREAKFAST. 31 tablet 0  . lidocaine-prilocaine (EMLA) cream Apply topically as needed. Apply to L upper arm (1) hour prior to dialysis on MWF. Wrap with plastic wrap after applying. 30 g 3  . mirtazapine (REMERON) 15 MG tablet Take 0.5 tablets (7.5 mg total) by mouth at bedtime. 15 tablet 6  . Multiple Vitamin (DAILY VITE) TABS Take 1 tablet by mouth daily. 30 tablet 11  . nitroGLYCERIN (NITROSTAT) 0.4 MG SL tablet Place 0.4 mg under the tongue every 5 (five) minutes as needed for chest pain.    . Polyethyl Glycol-Propyl Glycol (SYSTANE OP) Apply 1 drop to eye 4 (four) times daily as needed (dry eyes).     . polyethylene glycol powder (MIRALAX) powder Take 17 g by mouth daily. Hold for diarrhea 255 g 6  . ranitidine (ZANTAC) 150 MG tablet Take 1 tablet (150 mg total) by mouth at bedtime. 30 tablet 6  . sevelamer (RENVELA) 800 MG tablet Take 800-2,400 mg by mouth See admin instructions. 3 tabs with each meal and 1tab twice daily with snacks    . traMADol (ULTRAM) 50 MG tablet Take 1 tablet (50 mg total) by mouth 2 (two) times daily as needed. 45 tablet 2  . travoprost, benzalkonium, (TRAVATAN) 0.004 % ophthalmic solution Place 1 drop into both eyes at bedtime. Reported on 05/22/2015    . UNABLE TO FIND Dispense one rolling walker with seat attachment 1 each 0   No current facility-administered medications for this visit.     No Known Allergies   REVIEW OF SYSTEMS:  (Positives checked otherwise negative)  CARDIOVASCULAR:   [ ] chest pain,  [ ] chest pressure,  [ ] palpitations,  [ ] shortness of breath when laying flat,  [ ] shortness of breath with exertion,   [ ] pain in feet when walking,  [ ] pain in feet when laying flat, [  ] history of blood clot in veins (DVT),  [ ] history of phlebitis,  [ ] swelling in legs,  [ ] varicose veins  PULMONARY:   [ ] productive cough,  [ ] asthma,  [ ] wheezing  NEUROLOGIC:   [ ] weakness in arms or legs,  [ ] numbness in arms or legs,  [ ] difficulty speaking or slurred speech,  [ ] temporary loss of vision in one eye,  [ ] dizziness  HEMATOLOGIC:   [ ]   bleeding problems,  [ ] problems with blood clotting too easily  MUSCULOSKEL:   [ ] joint pain, [ ] joint swelling  GASTROINTEST:   [ ] vomiting blood,  [ ] blood in stool     GENITOURINARY:   [ ] burning with urination,  [ ] blood in urine [x] ESRD-HD: T/R/S  PSYCHIATRIC:   [ ] history of major depression  INTEGUMENTARY:   [ ] rashes,  [ ] ulcers  CONSTITUTIONAL:   [ ] fever,  [ ] chills   Physical Examination  Filed Vitals:   05/29/15 1507 05/29/15 1510  BP: 155/77 140/72  Pulse: 86 74  Temp: 98 F (36.7 C)   Resp: 16   Height: 5' 4" (1.626 m)   Weight: 182 lb (82.555 kg)   SpO2: 98%    Body mass index is 31.22 kg/(m^2).  General: A&O x 3, WD, WN  Pulmonary: Sym exp, good air movt, CTAB, no rales, rhonchi, & wheezing  Cardiac: RRR, Nl S1, S2, no Murmurs, rubs or gallops  Vascular: Vessel Right Left  Radial Palpable Not Palpable  Ulnar Not Palpable Not Palpable  Brachial Palpable Palpable   Gastrointestinal: soft, NTND, no G/R, bo HSM, no masses, no CVAT B  Musculoskeletal: M/S 5/5 throughout , Extremities without  ischemic changes , palpable thrill in access in L BC AVF with pulsatile character, + bruit in access, attenuate skin overlying pseudoaneurysm  Neurologic: Pain and light touch intact in extremities , Motor exam as listed above   Medical Decision Making  [name] is a 82 y.o. female who presents with ESRD requiring hemodialysis, L BC AVF PSA   I recommended: plication of left brachiocephalic arteriovenous fistula pseudoaneurysm with possible revision  of the fistula distal to the pseudoaneurysm. Risk, benefits, and alternatives to access surgery were discussed.   The patient is aware the risks include but are not limited to: bleeding, infection, steal syndrome, nerve damage, ischemic monomelic neuropathy, failure to mature, need for additional procedures, death and stroke.    The patient agrees to proceed forward with the procedure.  She is scheduled for 30 MAY 17.   Brian Chen, MD [facility] Office: [phone] Pager: [phone]  05/29/2015, 3:52 PM    

## 2015-06-10 NOTE — Anesthesia Procedure Notes (Signed)
Procedure Name: LMA Insertion Date/Time: 06/10/2015 7:32 AM Performed by: Luciana Axe K Pre-anesthesia Checklist: Patient identified, Emergency Drugs available, Suction available and Patient being monitored Patient Re-evaluated:Patient Re-evaluated prior to inductionOxygen Delivery Method: Circle System Utilized Preoxygenation: Pre-oxygenation with 100% oxygen Intubation Type: IV induction Ventilation: Mask ventilation without difficulty LMA: LMA inserted LMA Size: 4.0 Number of attempts: 1 Airway Equipment and Method: Bite block Placement Confirmation: positive ETCO2 Tube secured with: Tape Dental Injury: Teeth and Oropharynx as per pre-operative assessment

## 2015-06-10 NOTE — Transfer of Care (Signed)
Immediate Anesthesia Transfer of Care Note  Patient: [name]  Procedure(s) Performed: Procedure(s): LEFT UPPER ARM BRACHIOCEPHALIC FISTULA PSEUDOANEURYSM PLICATION (Left)  Patient Location: PACU  Anesthesia Type:General  Level of Consciousness: awake, oriented and patient cooperative  Airway & Oxygen Therapy: Patient Spontanous Breathing and Patient connected to nasal cannula oxygen  Post-op Assessment: Report given to RN and Post -op Vital signs reviewed and stable  Post vital signs: Reviewed  Last Vitals:  Filed Vitals:   06/10/15 0607  BP: 198/77  Pulse: 66  Temp: 36.7 C  Resp: 16    Last Pain: There were no vitals filed for this visit.       Complications: No apparent anesthesia complications

## 2015-06-10 NOTE — Anesthesia Postprocedure Evaluation (Signed)
Anesthesia Post Note  Patient: [name]  Procedure(s) Performed: Procedure(s) (LRB): LEFT UPPER ARM BRACHIOCEPHALIC FISTULA PSEUDOANEURYSM PLICATION (Left)  Patient location during evaluation: PACU Anesthesia Type: General Level of consciousness: awake and alert Pain management: pain level controlled Vital Signs Assessment: post-procedure vital signs reviewed and stable Respiratory status: spontaneous breathing, nonlabored ventilation, respiratory function stable and patient connected to nasal cannula oxygen Cardiovascular status: blood pressure returned to baseline and stable Postop Assessment: no signs of nausea or vomiting Anesthetic complications: no    Last Vitals:  Filed Vitals:   06/10/15 1032 06/10/15 1036  BP:  166/72  Pulse:  59  Temp: 36.7 C   Resp:  18    Last Pain:  Filed Vitals:   06/10/15 1045  PainSc: 0-No pain                 Effie Berkshire

## 2015-06-10 NOTE — Discharge Instructions (Signed)
° ° °  06/10/2015 [name] [id number] 08-14-1925  Surgeon(s): Conrad B[place], MD  Procedure(s): LEFT UPPER ARM BRACHIOCEPHALIC FISTULA PSEUDOANEURYSM PLICATION  x May stick graft on designated area only:  Do NOT stick over incision for 8 weeks. May stick above and below incision now. SEE DIAGRAM.

## 2015-06-10 NOTE — Op Note (Signed)
    OPERATIVE NOTE   PROCEDURE: 1. Plication of left brachiocephalic arteriovenous fistula   PRE-OPERATIVE DIAGNOSIS: Pseudoaneurysm degeneration of arteriovenous fistula   POST-OPERATIVE DIAGNOSIS: same as above   SURGEON: Adele Barthel, MD  ASSISTANT(S): Leontine Locket, PAC   ANESTHESIA: general  ESTIMATED BLOOD LOSS: 50 cc  FINDING(S): 1. Large pseudoaneurysm with 2 posterior oriented diverticulae 2. Palpable thrill at end of the case  SPECIMEN(S):  none  INDICATIONS:   [name] is a 82 y.o. 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  In order to salvage the fistula and decrease the bleeding complication risks, I recommended plication of the fistula.  Risk, benefits, and alternatives to access surgery were discussed.  The patient is aware the risks include but are not limited to: bleeding, infection, steal syndrome, nerve damage, ischemic monomelic neuropathy, failure to mature, need for additional procedures, death and stroke.  The patient agrees to proceed forward with the procedure.  DESCRIPTION: After obtaining full informed written consent, the patient was brought back to the operating room and placed supine upon the operating table.  The patient received IV antibiotics prior to induction.  After obtaining adequate anesthesia, the patient was prepped and draped in the standard fashion for: left access procedure.  The patient was given 8000 units of Heparin intravenously, which was a therapeutic bolus.  After waiting 5 minutes, a sterile tourniquet was applied to the upper arm and inflated to 250 mm Hg after exsanguinating the arm with an Esmark bandage.  An elliptical incision was made around the skin overlying the pseudoaneurysmal segment in the fistula.  I dissected the skin away from the pseudoaneurysm with electrocautery.  Eventually, I was able to skeletonize the pseudoaneurysm.  I sharply entered into the  pseudoaneurysm and resected an anterior portion of the pseudoaneurysm involved with the area of attenuated skin.  The residual fistula wall was felt to be adequately strong to repair.  I found two frozen valves which were resected.  I also found two posteriorly oriented diverticulae.  The more distal one I oversewed the opening.  The more proximal one was too large to easily oversew.  I also few its dimension would make repairing the fistula difficult after resecting, so I elected to leave this one alone.  I repaired the fistula with a running stitch of 5-0 Prolene, taking care to take large bites of the fistula wall.  Prior to completing this repair, I pass 5 mm dilator proximally and distally: no resistance was encountered.  The repair was completed in the usual fashion.  At this point, the patient was given 60 mg of Protamine intravenously to reverse the anticoagulation.  Thrombin and gelfoam were applied to the incision.  Bleeding points were controlled with suture ligature and electrocautery.  After a few more round of thrombin and gelfoam, no further active bleeding was noted.  I reapproximated the subcutaneous tissue immediately above the fistula with a running stitch of 3-0 Vicryl.  The skin was reapproximated with a running subcuticular of 4-0 Monocryl.  The skin was cleaned, dried, and the skin approximated with staples.  A sterile dressing was applied on top and a Kerlix applied.  The patient continued to have a palpable thrill in the fistula.  COMPLICATIONS: none  CONDITION: stable   Adele Barthel, MD [facility] Office: [phone] Pager: [phone]  06/10/2015, 8:45 AM

## 2015-06-10 NOTE — Interval H&P Note (Signed)
History and Physical Interval Note:  06/10/2015 6:58 AM  [name]  has presented today for surgery, with the diagnosis of End Stage Renal Disease N18.6  The various methods of treatment have been discussed with the patient and family. After consideration of risks, benefits and other options for treatment, the patient has consented to  Procedure(s): Trappe (Left) as a surgical intervention .  The patient's history has been reviewed, patient examined, no change in status, stable for surgery.  I have reviewed the patient's chart and labs.  Questions were answered to the patient's satisfaction.     Adele Barthel

## 2015-06-11 ENCOUNTER — Telehealth: Payer: Self-pay | Admitting: Vascular Surgery

## 2015-06-11 DIAGNOSIS — N186 End stage renal disease: Secondary | ICD-10-CM | POA: Diagnosis not present

## 2015-06-11 DIAGNOSIS — Z992 Dependence on renal dialysis: Secondary | ICD-10-CM | POA: Diagnosis not present

## 2015-06-11 DIAGNOSIS — F0390 Unspecified dementia without behavioral disturbance: Secondary | ICD-10-CM | POA: Diagnosis not present

## 2015-06-11 NOTE — Telephone Encounter (Signed)
Sched staple removal for 6/16 at 9:45. Sched md f/u for 6/30 at 8:30. Spoke to nursing hm (hm#) to inform them of appts.

## 2015-06-11 NOTE — Telephone Encounter (Signed)
-----   Message from Mena Goes, RN sent at 06/10/2015 10:09 AM EDT ----- Regarding: schedule   ----- Message -----    From: Paula [place], MD    Sent: 06/10/2015   8:51 AM      To: [name] [phone]  PROCEDURE: Plication of left brachiocephalic arteriovenous fistula   Asst: Leontine Locket, PAC   Follow-up: 2 weeks for staples removal (nurse visit), MD follow in 4 weeks

## 2015-06-12 ENCOUNTER — Encounter (HOSPITAL_COMMUNITY): Payer: Self-pay | Admitting: Vascular Surgery

## 2015-06-12 DIAGNOSIS — F0390 Unspecified dementia without behavioral disturbance: Secondary | ICD-10-CM | POA: Diagnosis not present

## 2015-06-12 DIAGNOSIS — E109 Type 1 diabetes mellitus without complications: Secondary | ICD-10-CM | POA: Diagnosis not present

## 2015-06-12 DIAGNOSIS — Z0100 Encounter for examination of eyes and vision without abnormal findings: Secondary | ICD-10-CM | POA: Diagnosis not present

## 2015-06-12 LAB — HM DIABETES EYE EXAM

## 2015-06-13 DIAGNOSIS — F0390 Unspecified dementia without behavioral disturbance: Secondary | ICD-10-CM | POA: Diagnosis not present

## 2015-06-13 DIAGNOSIS — Z992 Dependence on renal dialysis: Secondary | ICD-10-CM | POA: Diagnosis not present

## 2015-06-13 DIAGNOSIS — N186 End stage renal disease: Secondary | ICD-10-CM | POA: Diagnosis not present

## 2015-06-14 DIAGNOSIS — F0390 Unspecified dementia without behavioral disturbance: Secondary | ICD-10-CM | POA: Diagnosis not present

## 2015-06-15 DIAGNOSIS — F0390 Unspecified dementia without behavioral disturbance: Secondary | ICD-10-CM | POA: Diagnosis not present

## 2015-06-16 ENCOUNTER — Telehealth: Payer: Self-pay | Admitting: Family Medicine

## 2015-06-16 DIAGNOSIS — N186 End stage renal disease: Secondary | ICD-10-CM | POA: Diagnosis not present

## 2015-06-16 DIAGNOSIS — F0390 Unspecified dementia without behavioral disturbance: Secondary | ICD-10-CM | POA: Diagnosis not present

## 2015-06-16 DIAGNOSIS — Z992 Dependence on renal dialysis: Secondary | ICD-10-CM | POA: Diagnosis not present

## 2015-06-16 NOTE — Telephone Encounter (Signed)
Received fax from [facility] back Auth# [id number] Treating Provider Mayford Knife # of Visits 6 Start Date 06/12/2015 End Date 12/09/2015 CPT [id number] DX E11.21

## 2015-06-17 ENCOUNTER — Encounter: Payer: Self-pay | Admitting: *Deleted

## 2015-06-17 DIAGNOSIS — F0390 Unspecified dementia without behavioral disturbance: Secondary | ICD-10-CM | POA: Diagnosis not present

## 2015-06-18 DIAGNOSIS — N186 End stage renal disease: Secondary | ICD-10-CM | POA: Diagnosis not present

## 2015-06-18 DIAGNOSIS — Z992 Dependence on renal dialysis: Secondary | ICD-10-CM | POA: Diagnosis not present

## 2015-06-18 DIAGNOSIS — F0390 Unspecified dementia without behavioral disturbance: Secondary | ICD-10-CM | POA: Diagnosis not present

## 2015-06-19 ENCOUNTER — Encounter: Payer: Self-pay | Admitting: Family

## 2015-06-19 DIAGNOSIS — F0390 Unspecified dementia without behavioral disturbance: Secondary | ICD-10-CM | POA: Diagnosis not present

## 2015-06-20 ENCOUNTER — Encounter: Payer: Commercial Managed Care - HMO | Admitting: Family

## 2015-06-20 DIAGNOSIS — N186 End stage renal disease: Secondary | ICD-10-CM | POA: Diagnosis not present

## 2015-06-20 DIAGNOSIS — F0390 Unspecified dementia without behavioral disturbance: Secondary | ICD-10-CM | POA: Diagnosis not present

## 2015-06-20 DIAGNOSIS — Z992 Dependence on renal dialysis: Secondary | ICD-10-CM | POA: Diagnosis not present

## 2015-06-21 DIAGNOSIS — F0390 Unspecified dementia without behavioral disturbance: Secondary | ICD-10-CM | POA: Diagnosis not present

## 2015-06-22 DIAGNOSIS — F0390 Unspecified dementia without behavioral disturbance: Secondary | ICD-10-CM | POA: Diagnosis not present

## 2015-06-23 DIAGNOSIS — F0390 Unspecified dementia without behavioral disturbance: Secondary | ICD-10-CM | POA: Diagnosis not present

## 2015-06-23 DIAGNOSIS — Z992 Dependence on renal dialysis: Secondary | ICD-10-CM | POA: Diagnosis not present

## 2015-06-23 DIAGNOSIS — N186 End stage renal disease: Secondary | ICD-10-CM | POA: Diagnosis not present

## 2015-06-24 DIAGNOSIS — F0390 Unspecified dementia without behavioral disturbance: Secondary | ICD-10-CM | POA: Diagnosis not present

## 2015-06-25 DIAGNOSIS — D631 Anemia in chronic kidney disease: Secondary | ICD-10-CM | POA: Diagnosis not present

## 2015-06-25 DIAGNOSIS — Z992 Dependence on renal dialysis: Secondary | ICD-10-CM | POA: Diagnosis not present

## 2015-06-25 DIAGNOSIS — N186 End stage renal disease: Secondary | ICD-10-CM | POA: Diagnosis not present

## 2015-06-25 DIAGNOSIS — F0390 Unspecified dementia without behavioral disturbance: Secondary | ICD-10-CM | POA: Diagnosis not present

## 2015-06-26 ENCOUNTER — Ambulatory Visit (INDEPENDENT_AMBULATORY_CARE_PROVIDER_SITE_OTHER): Payer: Commercial Managed Care - HMO | Admitting: Family

## 2015-06-26 ENCOUNTER — Other Ambulatory Visit: Payer: Self-pay | Admitting: Family Medicine

## 2015-06-26 VITALS — BP 151/69 | HR 70 | Temp 97.9°F | Resp 16 | Ht 66.0 in | Wt 185.0 lb

## 2015-06-26 DIAGNOSIS — Z992 Dependence on renal dialysis: Secondary | ICD-10-CM

## 2015-06-26 DIAGNOSIS — T82510D Breakdown (mechanical) of surgically created arteriovenous fistula, subsequent encounter: Secondary | ICD-10-CM

## 2015-06-26 DIAGNOSIS — Z4802 Encounter for removal of sutures: Secondary | ICD-10-CM

## 2015-06-26 DIAGNOSIS — T82898D Other specified complication of vascular prosthetic devices, implants and grafts, subsequent encounter: Secondary | ICD-10-CM

## 2015-06-26 DIAGNOSIS — N186 End stage renal disease: Secondary | ICD-10-CM

## 2015-06-26 DIAGNOSIS — F0390 Unspecified dementia without behavioral disturbance: Secondary | ICD-10-CM | POA: Diagnosis not present

## 2015-06-26 NOTE — Progress Notes (Signed)
Filed Vitals:   06/26/15 1002 06/26/15 1006  BP: 180/70 151/69  Pulse: 70 70  Temp: 97.9 F (36.6 C)   Resp: 16   Height: 5\' 6"  (1.676 m)   Weight: 185 lb (83.915 kg)   SpO2: 99%

## 2015-06-26 NOTE — Progress Notes (Signed)
    Postoperative Access Visit   History of Present Illness  [name] is a 82 y.o. year old female who is s/p Plication of left brachiocephalic arteriovenous fistula on 06/10/15 by Dr. Bridgett Larsson for Pseudoaneurysm degeneration of arteriovenous fistula. She returns today for 2 weeks post op staples removal.  She is scheduled to see Dr. Bridgett Larsson on 07/11/15, 4 pm; she dialyzes M-W-F in the mornings in [place].  Pt is right hand domienent. She states that the pain in her left hand stopped after the above plication.  The HD center is accessing her fistula proximal to the plication.  The patient's wounds are healed.  The patient notes no steal symptoms.  The patient is able to complete their activities of daily living.  The patient's current symptoms are: none.  For VQI Use Only  PRE-ADM LIVING: Home  AMB STATUS: Ambulatory  Physical Examination Filed Vitals:   06/26/15 1002 06/26/15 1006  BP: 180/70 151/69  Pulse: 70 70  Temp: 97.9 F (36.6 C)   Resp: 16   Height: 5\' 6"  (1.676 m)   Weight: 185 lb (83.915 kg)   SpO2: 99%    Body mass index is 29.87 kg/(m^2).  LUE: Incision is well healed, skin feels warm and normal, hand grip is 5/5, sensation in digits is intact, palpable thrill, bruit can be auscultated.  Medical Decision Making  [name] is a 82 y.o. year old female who presents s/p Plication of left brachiocephalic arteriovenous fistula on 06/10/15 for Pseudoaneurysm degeneration of arteriovenous fistula. All staples removed today. She has no more steal sx's in her left hand/forearm since the plication.  Follow up with Dr. Bridgett Larsson on 07/11/15 at 4 pm, after her morning HD session, for evaluation of plication site.  Thank you for allowing [place] to participate in this patient's care.  Davit Vassar, Sharmon Leyden, RN, MSN, FNP-C [facility] Office: [phone]  06/26/2015, 10:24 AM  Clinic MD: Bridgett Larsson

## 2015-06-27 ENCOUNTER — Encounter: Payer: Commercial Managed Care - HMO | Admitting: Family

## 2015-06-27 DIAGNOSIS — Z992 Dependence on renal dialysis: Secondary | ICD-10-CM | POA: Diagnosis not present

## 2015-06-27 DIAGNOSIS — F0390 Unspecified dementia without behavioral disturbance: Secondary | ICD-10-CM | POA: Diagnosis not present

## 2015-06-27 DIAGNOSIS — N186 End stage renal disease: Secondary | ICD-10-CM | POA: Diagnosis not present

## 2015-06-28 DIAGNOSIS — F0390 Unspecified dementia without behavioral disturbance: Secondary | ICD-10-CM | POA: Diagnosis not present

## 2015-06-29 DIAGNOSIS — F0390 Unspecified dementia without behavioral disturbance: Secondary | ICD-10-CM | POA: Diagnosis not present

## 2015-06-30 DIAGNOSIS — N186 End stage renal disease: Secondary | ICD-10-CM | POA: Diagnosis not present

## 2015-06-30 DIAGNOSIS — Z992 Dependence on renal dialysis: Secondary | ICD-10-CM | POA: Diagnosis not present

## 2015-06-30 DIAGNOSIS — F0390 Unspecified dementia without behavioral disturbance: Secondary | ICD-10-CM | POA: Diagnosis not present

## 2015-07-01 DIAGNOSIS — E113211 Type 2 diabetes mellitus with mild nonproliferative diabetic retinopathy with macular edema, right eye: Secondary | ICD-10-CM | POA: Diagnosis not present

## 2015-07-01 DIAGNOSIS — H43813 Vitreous degeneration, bilateral: Secondary | ICD-10-CM | POA: Diagnosis not present

## 2015-07-01 DIAGNOSIS — H34812 Central retinal vein occlusion, left eye, with macular edema: Secondary | ICD-10-CM | POA: Diagnosis not present

## 2015-07-01 DIAGNOSIS — E113292 Type 2 diabetes mellitus with mild nonproliferative diabetic retinopathy without macular edema, left eye: Secondary | ICD-10-CM | POA: Diagnosis not present

## 2015-07-01 DIAGNOSIS — F0390 Unspecified dementia without behavioral disturbance: Secondary | ICD-10-CM | POA: Diagnosis not present

## 2015-07-02 DIAGNOSIS — N186 End stage renal disease: Secondary | ICD-10-CM | POA: Diagnosis not present

## 2015-07-02 DIAGNOSIS — F0390 Unspecified dementia without behavioral disturbance: Secondary | ICD-10-CM | POA: Diagnosis not present

## 2015-07-02 DIAGNOSIS — Z992 Dependence on renal dialysis: Secondary | ICD-10-CM | POA: Diagnosis not present

## 2015-07-03 DIAGNOSIS — F0390 Unspecified dementia without behavioral disturbance: Secondary | ICD-10-CM | POA: Diagnosis not present

## 2015-07-04 ENCOUNTER — Encounter: Payer: Self-pay | Admitting: Vascular Surgery

## 2015-07-04 DIAGNOSIS — Z992 Dependence on renal dialysis: Secondary | ICD-10-CM | POA: Diagnosis not present

## 2015-07-04 DIAGNOSIS — N186 End stage renal disease: Secondary | ICD-10-CM | POA: Diagnosis not present

## 2015-07-04 DIAGNOSIS — F0390 Unspecified dementia without behavioral disturbance: Secondary | ICD-10-CM | POA: Diagnosis not present

## 2015-07-05 DIAGNOSIS — F0390 Unspecified dementia without behavioral disturbance: Secondary | ICD-10-CM | POA: Diagnosis not present

## 2015-07-06 DIAGNOSIS — F0390 Unspecified dementia without behavioral disturbance: Secondary | ICD-10-CM | POA: Diagnosis not present

## 2015-07-07 DIAGNOSIS — N186 End stage renal disease: Secondary | ICD-10-CM | POA: Diagnosis not present

## 2015-07-07 DIAGNOSIS — Z992 Dependence on renal dialysis: Secondary | ICD-10-CM | POA: Diagnosis not present

## 2015-07-07 DIAGNOSIS — F0390 Unspecified dementia without behavioral disturbance: Secondary | ICD-10-CM | POA: Diagnosis not present

## 2015-07-08 DIAGNOSIS — F0390 Unspecified dementia without behavioral disturbance: Secondary | ICD-10-CM | POA: Diagnosis not present

## 2015-07-09 DIAGNOSIS — N186 End stage renal disease: Secondary | ICD-10-CM | POA: Diagnosis not present

## 2015-07-09 DIAGNOSIS — F0390 Unspecified dementia without behavioral disturbance: Secondary | ICD-10-CM | POA: Diagnosis not present

## 2015-07-09 DIAGNOSIS — Z992 Dependence on renal dialysis: Secondary | ICD-10-CM | POA: Diagnosis not present

## 2015-07-09 DIAGNOSIS — E1122 Type 2 diabetes mellitus with diabetic chronic kidney disease: Secondary | ICD-10-CM | POA: Diagnosis not present

## 2015-07-10 DIAGNOSIS — F0390 Unspecified dementia without behavioral disturbance: Secondary | ICD-10-CM | POA: Diagnosis not present

## 2015-07-11 ENCOUNTER — Ambulatory Visit (INDEPENDENT_AMBULATORY_CARE_PROVIDER_SITE_OTHER): Payer: Commercial Managed Care - HMO | Admitting: Vascular Surgery

## 2015-07-11 ENCOUNTER — Encounter: Payer: Commercial Managed Care - HMO | Admitting: Vascular Surgery

## 2015-07-11 ENCOUNTER — Encounter: Payer: Self-pay | Admitting: Vascular Surgery

## 2015-07-11 VITALS — BP 135/64 | HR 70 | Temp 99.2°F | Resp 18 | Ht 66.0 in | Wt 186.0 lb

## 2015-07-11 DIAGNOSIS — Z992 Dependence on renal dialysis: Secondary | ICD-10-CM | POA: Diagnosis not present

## 2015-07-11 DIAGNOSIS — N186 End stage renal disease: Secondary | ICD-10-CM | POA: Diagnosis not present

## 2015-07-11 DIAGNOSIS — F0390 Unspecified dementia without behavioral disturbance: Secondary | ICD-10-CM | POA: Diagnosis not present

## 2015-07-11 NOTE — Progress Notes (Signed)
    Postoperative Access Visit   History of Present Illness  [name] is a 82 y.o. year old female who presents for postoperative follow-up for: plication of L BC AVF (Date: 06/10/15).  The patient's wounds are healed.  The patient notes no steal symptoms.  The patient is able to complete their activities of daily living.  The patient's current symptoms are: none.  For VQI Use Only  PRE-ADM LIVING: Home  AMB STATUS: Ambulatory  Physical Examination Filed Vitals:   07/11/15 1425  BP: 135/64  Pulse: 70  Temp: 99.2 F (37.3 C)  Resp: 18    LUE: Incision is healed, skin feels warm, hand grip is 5/5, sensation in digits is intact, palpable thrill, bruit can be auscultated   Medical Decision Making  [name] is a 82 y.o. year old female who presents s/p L BC AVF plication for PSA   The patient's access is ready for use.  Thank you for allowing [place] to participate in this patient's care.  Adele Barthel, MD, FACS [facility] Office: [phone] Pager: [phone]

## 2015-07-12 DIAGNOSIS — F0390 Unspecified dementia without behavioral disturbance: Secondary | ICD-10-CM | POA: Diagnosis not present

## 2015-07-13 DIAGNOSIS — F0390 Unspecified dementia without behavioral disturbance: Secondary | ICD-10-CM | POA: Diagnosis not present

## 2015-07-14 DIAGNOSIS — F0390 Unspecified dementia without behavioral disturbance: Secondary | ICD-10-CM | POA: Diagnosis not present

## 2015-07-14 DIAGNOSIS — R079 Chest pain, unspecified: Secondary | ICD-10-CM | POA: Diagnosis not present

## 2015-07-14 DIAGNOSIS — Z992 Dependence on renal dialysis: Secondary | ICD-10-CM | POA: Diagnosis not present

## 2015-07-14 DIAGNOSIS — R072 Precordial pain: Secondary | ICD-10-CM | POA: Diagnosis not present

## 2015-07-14 DIAGNOSIS — Z743 Need for continuous supervision: Secondary | ICD-10-CM | POA: Diagnosis not present

## 2015-07-14 DIAGNOSIS — N189 Chronic kidney disease, unspecified: Secondary | ICD-10-CM | POA: Diagnosis not present

## 2015-07-14 DIAGNOSIS — Z79899 Other long term (current) drug therapy: Secondary | ICD-10-CM | POA: Diagnosis not present

## 2015-07-14 DIAGNOSIS — Z8543 Personal history of malignant neoplasm of ovary: Secondary | ICD-10-CM | POA: Diagnosis not present

## 2015-07-14 DIAGNOSIS — R0602 Shortness of breath: Secondary | ICD-10-CM | POA: Diagnosis not present

## 2015-07-14 DIAGNOSIS — Z7982 Long term (current) use of aspirin: Secondary | ICD-10-CM | POA: Diagnosis not present

## 2015-07-14 DIAGNOSIS — I129 Hypertensive chronic kidney disease with stage 1 through stage 4 chronic kidney disease, or unspecified chronic kidney disease: Secondary | ICD-10-CM | POA: Diagnosis not present

## 2015-07-14 DIAGNOSIS — N186 End stage renal disease: Secondary | ICD-10-CM | POA: Diagnosis not present

## 2015-07-14 DIAGNOSIS — R279 Unspecified lack of coordination: Secondary | ICD-10-CM | POA: Diagnosis not present

## 2015-07-14 DIAGNOSIS — E1122 Type 2 diabetes mellitus with diabetic chronic kidney disease: Secondary | ICD-10-CM | POA: Diagnosis not present

## 2015-07-14 DIAGNOSIS — E039 Hypothyroidism, unspecified: Secondary | ICD-10-CM | POA: Diagnosis not present

## 2015-07-15 DIAGNOSIS — F0390 Unspecified dementia without behavioral disturbance: Secondary | ICD-10-CM | POA: Diagnosis not present

## 2015-07-16 ENCOUNTER — Telehealth: Payer: Self-pay | Admitting: Family Medicine

## 2015-07-16 DIAGNOSIS — N186 End stage renal disease: Secondary | ICD-10-CM | POA: Diagnosis not present

## 2015-07-16 DIAGNOSIS — Z992 Dependence on renal dialysis: Secondary | ICD-10-CM | POA: Diagnosis not present

## 2015-07-16 DIAGNOSIS — F0390 Unspecified dementia without behavioral disturbance: Secondary | ICD-10-CM | POA: Diagnosis not present

## 2015-07-16 NOTE — Telephone Encounter (Signed)
Received fax from [place] [facility] back Auth# [id number]  Treating Provider Adele Barthel # of Visits 6 Start Date 05/29/2015 End Date 11/25/15 CPT 99499 DX N18.6

## 2015-07-17 DIAGNOSIS — F0390 Unspecified dementia without behavioral disturbance: Secondary | ICD-10-CM | POA: Diagnosis not present

## 2015-07-18 DIAGNOSIS — N186 End stage renal disease: Secondary | ICD-10-CM | POA: Diagnosis not present

## 2015-07-18 DIAGNOSIS — Z992 Dependence on renal dialysis: Secondary | ICD-10-CM | POA: Diagnosis not present

## 2015-07-18 DIAGNOSIS — F0390 Unspecified dementia without behavioral disturbance: Secondary | ICD-10-CM | POA: Diagnosis not present

## 2015-07-19 DIAGNOSIS — F0390 Unspecified dementia without behavioral disturbance: Secondary | ICD-10-CM | POA: Diagnosis not present

## 2015-07-20 DIAGNOSIS — F0390 Unspecified dementia without behavioral disturbance: Secondary | ICD-10-CM | POA: Diagnosis not present

## 2015-07-21 DIAGNOSIS — Z992 Dependence on renal dialysis: Secondary | ICD-10-CM | POA: Diagnosis not present

## 2015-07-21 DIAGNOSIS — N186 End stage renal disease: Secondary | ICD-10-CM | POA: Diagnosis not present

## 2015-07-21 DIAGNOSIS — F0390 Unspecified dementia without behavioral disturbance: Secondary | ICD-10-CM | POA: Diagnosis not present

## 2015-07-22 DIAGNOSIS — F0390 Unspecified dementia without behavioral disturbance: Secondary | ICD-10-CM | POA: Diagnosis not present

## 2015-07-23 DIAGNOSIS — N186 End stage renal disease: Secondary | ICD-10-CM | POA: Diagnosis not present

## 2015-07-23 DIAGNOSIS — F0390 Unspecified dementia without behavioral disturbance: Secondary | ICD-10-CM | POA: Diagnosis not present

## 2015-07-23 DIAGNOSIS — R159 Full incontinence of feces: Secondary | ICD-10-CM | POA: Diagnosis not present

## 2015-07-23 DIAGNOSIS — R32 Unspecified urinary incontinence: Secondary | ICD-10-CM | POA: Diagnosis not present

## 2015-07-23 DIAGNOSIS — Z992 Dependence on renal dialysis: Secondary | ICD-10-CM | POA: Diagnosis not present

## 2015-07-24 DIAGNOSIS — F0390 Unspecified dementia without behavioral disturbance: Secondary | ICD-10-CM | POA: Diagnosis not present

## 2015-07-25 DIAGNOSIS — N186 End stage renal disease: Secondary | ICD-10-CM | POA: Diagnosis not present

## 2015-07-25 DIAGNOSIS — Z992 Dependence on renal dialysis: Secondary | ICD-10-CM | POA: Diagnosis not present

## 2015-07-25 DIAGNOSIS — F0390 Unspecified dementia without behavioral disturbance: Secondary | ICD-10-CM | POA: Diagnosis not present

## 2015-07-26 DIAGNOSIS — F0390 Unspecified dementia without behavioral disturbance: Secondary | ICD-10-CM | POA: Diagnosis not present

## 2015-07-27 DIAGNOSIS — F0390 Unspecified dementia without behavioral disturbance: Secondary | ICD-10-CM | POA: Diagnosis not present

## 2015-07-28 DIAGNOSIS — N186 End stage renal disease: Secondary | ICD-10-CM | POA: Diagnosis not present

## 2015-07-28 DIAGNOSIS — F0390 Unspecified dementia without behavioral disturbance: Secondary | ICD-10-CM | POA: Diagnosis not present

## 2015-07-28 DIAGNOSIS — Z992 Dependence on renal dialysis: Secondary | ICD-10-CM | POA: Diagnosis not present

## 2015-07-29 DIAGNOSIS — F0390 Unspecified dementia without behavioral disturbance: Secondary | ICD-10-CM | POA: Diagnosis not present

## 2015-07-30 DIAGNOSIS — F0390 Unspecified dementia without behavioral disturbance: Secondary | ICD-10-CM | POA: Diagnosis not present

## 2015-07-30 DIAGNOSIS — N186 End stage renal disease: Secondary | ICD-10-CM | POA: Diagnosis not present

## 2015-07-30 DIAGNOSIS — Z992 Dependence on renal dialysis: Secondary | ICD-10-CM | POA: Diagnosis not present

## 2015-07-31 DIAGNOSIS — F0390 Unspecified dementia without behavioral disturbance: Secondary | ICD-10-CM | POA: Diagnosis not present

## 2015-08-01 DIAGNOSIS — Z992 Dependence on renal dialysis: Secondary | ICD-10-CM | POA: Diagnosis not present

## 2015-08-01 DIAGNOSIS — N186 End stage renal disease: Secondary | ICD-10-CM | POA: Diagnosis not present

## 2015-08-01 DIAGNOSIS — F0390 Unspecified dementia without behavioral disturbance: Secondary | ICD-10-CM | POA: Diagnosis not present

## 2015-08-02 DIAGNOSIS — F0390 Unspecified dementia without behavioral disturbance: Secondary | ICD-10-CM | POA: Diagnosis not present

## 2015-08-03 DIAGNOSIS — F0390 Unspecified dementia without behavioral disturbance: Secondary | ICD-10-CM | POA: Diagnosis not present

## 2015-08-04 DIAGNOSIS — Z992 Dependence on renal dialysis: Secondary | ICD-10-CM | POA: Diagnosis not present

## 2015-08-04 DIAGNOSIS — H34812 Central retinal vein occlusion, left eye, with macular edema: Secondary | ICD-10-CM | POA: Diagnosis not present

## 2015-08-04 DIAGNOSIS — F0390 Unspecified dementia without behavioral disturbance: Secondary | ICD-10-CM | POA: Diagnosis not present

## 2015-08-04 DIAGNOSIS — N186 End stage renal disease: Secondary | ICD-10-CM | POA: Diagnosis not present

## 2015-08-05 ENCOUNTER — Telehealth: Payer: Self-pay | Admitting: Family Medicine

## 2015-08-05 DIAGNOSIS — F0390 Unspecified dementia without behavioral disturbance: Secondary | ICD-10-CM | POA: Diagnosis not present

## 2015-08-05 NOTE — Telephone Encounter (Signed)
[facility] referral requested for continuing visits with Dr Sherlynn Stalls at [facility].  Referral approved for 4 visits 08/05/15-02/01/16 Auth # [id number]  Dx [id number]

## 2015-08-06 DIAGNOSIS — F0390 Unspecified dementia without behavioral disturbance: Secondary | ICD-10-CM | POA: Diagnosis not present

## 2015-08-06 DIAGNOSIS — Z992 Dependence on renal dialysis: Secondary | ICD-10-CM | POA: Diagnosis not present

## 2015-08-06 DIAGNOSIS — N186 End stage renal disease: Secondary | ICD-10-CM | POA: Diagnosis not present

## 2015-08-07 DIAGNOSIS — F0390 Unspecified dementia without behavioral disturbance: Secondary | ICD-10-CM | POA: Diagnosis not present

## 2015-08-08 DIAGNOSIS — N186 End stage renal disease: Secondary | ICD-10-CM | POA: Diagnosis not present

## 2015-08-08 DIAGNOSIS — F0390 Unspecified dementia without behavioral disturbance: Secondary | ICD-10-CM | POA: Diagnosis not present

## 2015-08-08 DIAGNOSIS — Z992 Dependence on renal dialysis: Secondary | ICD-10-CM | POA: Diagnosis not present

## 2015-08-09 DIAGNOSIS — F0390 Unspecified dementia without behavioral disturbance: Secondary | ICD-10-CM | POA: Diagnosis not present

## 2015-08-10 DIAGNOSIS — F0390 Unspecified dementia without behavioral disturbance: Secondary | ICD-10-CM | POA: Diagnosis not present

## 2015-08-11 DIAGNOSIS — F0390 Unspecified dementia without behavioral disturbance: Secondary | ICD-10-CM | POA: Diagnosis not present

## 2015-08-11 DIAGNOSIS — N186 End stage renal disease: Secondary | ICD-10-CM | POA: Diagnosis not present

## 2015-08-11 DIAGNOSIS — Z992 Dependence on renal dialysis: Secondary | ICD-10-CM | POA: Diagnosis not present

## 2015-08-12 DIAGNOSIS — F0390 Unspecified dementia without behavioral disturbance: Secondary | ICD-10-CM | POA: Diagnosis not present

## 2015-08-13 DIAGNOSIS — N186 End stage renal disease: Secondary | ICD-10-CM | POA: Diagnosis not present

## 2015-08-13 DIAGNOSIS — F0390 Unspecified dementia without behavioral disturbance: Secondary | ICD-10-CM | POA: Diagnosis not present

## 2015-08-13 DIAGNOSIS — Z992 Dependence on renal dialysis: Secondary | ICD-10-CM | POA: Diagnosis not present

## 2015-08-14 DIAGNOSIS — F0390 Unspecified dementia without behavioral disturbance: Secondary | ICD-10-CM | POA: Diagnosis not present

## 2015-08-15 DIAGNOSIS — F0390 Unspecified dementia without behavioral disturbance: Secondary | ICD-10-CM | POA: Diagnosis not present

## 2015-08-15 DIAGNOSIS — Z992 Dependence on renal dialysis: Secondary | ICD-10-CM | POA: Diagnosis not present

## 2015-08-15 DIAGNOSIS — N186 End stage renal disease: Secondary | ICD-10-CM | POA: Diagnosis not present

## 2015-08-16 DIAGNOSIS — F0390 Unspecified dementia without behavioral disturbance: Secondary | ICD-10-CM | POA: Diagnosis not present

## 2015-08-17 DIAGNOSIS — F0390 Unspecified dementia without behavioral disturbance: Secondary | ICD-10-CM | POA: Diagnosis not present

## 2015-08-18 DIAGNOSIS — Z992 Dependence on renal dialysis: Secondary | ICD-10-CM | POA: Diagnosis not present

## 2015-08-18 DIAGNOSIS — F0390 Unspecified dementia without behavioral disturbance: Secondary | ICD-10-CM | POA: Diagnosis not present

## 2015-08-18 DIAGNOSIS — N186 End stage renal disease: Secondary | ICD-10-CM | POA: Diagnosis not present

## 2015-08-19 ENCOUNTER — Other Ambulatory Visit: Payer: Self-pay | Admitting: Family Medicine

## 2015-08-19 DIAGNOSIS — F0390 Unspecified dementia without behavioral disturbance: Secondary | ICD-10-CM | POA: Diagnosis not present

## 2015-08-19 NOTE — Telephone Encounter (Signed)
Refill appropriate and filled per protocol. 

## 2015-08-20 DIAGNOSIS — F0390 Unspecified dementia without behavioral disturbance: Secondary | ICD-10-CM | POA: Diagnosis not present

## 2015-08-20 DIAGNOSIS — E1051 Type 1 diabetes mellitus with diabetic peripheral angiopathy without gangrene: Secondary | ICD-10-CM | POA: Diagnosis not present

## 2015-08-20 DIAGNOSIS — L84 Corns and callosities: Secondary | ICD-10-CM | POA: Diagnosis not present

## 2015-08-20 DIAGNOSIS — Z992 Dependence on renal dialysis: Secondary | ICD-10-CM | POA: Diagnosis not present

## 2015-08-20 DIAGNOSIS — N186 End stage renal disease: Secondary | ICD-10-CM | POA: Diagnosis not present

## 2015-08-21 DIAGNOSIS — F0390 Unspecified dementia without behavioral disturbance: Secondary | ICD-10-CM | POA: Diagnosis not present

## 2015-08-22 DIAGNOSIS — F0390 Unspecified dementia without behavioral disturbance: Secondary | ICD-10-CM | POA: Diagnosis not present

## 2015-08-22 DIAGNOSIS — N186 End stage renal disease: Secondary | ICD-10-CM | POA: Diagnosis not present

## 2015-08-22 DIAGNOSIS — Z992 Dependence on renal dialysis: Secondary | ICD-10-CM | POA: Diagnosis not present

## 2015-08-23 DIAGNOSIS — F0390 Unspecified dementia without behavioral disturbance: Secondary | ICD-10-CM | POA: Diagnosis not present

## 2015-08-24 DIAGNOSIS — F0390 Unspecified dementia without behavioral disturbance: Secondary | ICD-10-CM | POA: Diagnosis not present

## 2015-08-25 DIAGNOSIS — F0390 Unspecified dementia without behavioral disturbance: Secondary | ICD-10-CM | POA: Diagnosis not present

## 2015-08-25 DIAGNOSIS — Z992 Dependence on renal dialysis: Secondary | ICD-10-CM | POA: Diagnosis not present

## 2015-08-25 DIAGNOSIS — N186 End stage renal disease: Secondary | ICD-10-CM | POA: Diagnosis not present

## 2015-08-26 ENCOUNTER — Ambulatory Visit (INDEPENDENT_AMBULATORY_CARE_PROVIDER_SITE_OTHER): Payer: Commercial Managed Care - HMO | Admitting: Family Medicine

## 2015-08-26 ENCOUNTER — Encounter: Payer: Self-pay | Admitting: Family Medicine

## 2015-08-26 VITALS — BP 134/64 | HR 84 | Temp 98.9°F | Resp 20 | Ht 66.0 in | Wt 190.0 lb

## 2015-08-26 DIAGNOSIS — Z992 Dependence on renal dialysis: Secondary | ICD-10-CM

## 2015-08-26 DIAGNOSIS — N186 End stage renal disease: Secondary | ICD-10-CM | POA: Diagnosis not present

## 2015-08-26 DIAGNOSIS — E113292 Type 2 diabetes mellitus with mild nonproliferative diabetic retinopathy without macular edema, left eye: Secondary | ICD-10-CM | POA: Diagnosis not present

## 2015-08-26 DIAGNOSIS — E113211 Type 2 diabetes mellitus with mild nonproliferative diabetic retinopathy with macular edema, right eye: Secondary | ICD-10-CM | POA: Diagnosis not present

## 2015-08-26 DIAGNOSIS — H348122 Central retinal vein occlusion, left eye, stable: Secondary | ICD-10-CM | POA: Diagnosis not present

## 2015-08-26 DIAGNOSIS — E1122 Type 2 diabetes mellitus with diabetic chronic kidney disease: Secondary | ICD-10-CM

## 2015-08-26 DIAGNOSIS — I1311 Hypertensive heart and chronic kidney disease without heart failure, with stage 5 chronic kidney disease, or end stage renal disease: Secondary | ICD-10-CM | POA: Diagnosis not present

## 2015-08-26 DIAGNOSIS — N185 Chronic kidney disease, stage 5: Secondary | ICD-10-CM | POA: Diagnosis not present

## 2015-08-26 DIAGNOSIS — H35371 Puckering of macula, right eye: Secondary | ICD-10-CM | POA: Diagnosis not present

## 2015-08-26 DIAGNOSIS — F0390 Unspecified dementia without behavioral disturbance: Secondary | ICD-10-CM | POA: Diagnosis not present

## 2015-08-26 NOTE — Progress Notes (Signed)
   Subjective:    Patient ID: [name], female    DOB: 1925/09/25, 82 y.o.   MRN: [id number]  Patient presents for 4 month F/U (is not fasting) Patient here for follow-up she turned 82 years old last week. She still living at the assisted living home. He is followed by nephrology for hemodialysis 3 times a week.She has noticed that she has decreased urine output was told by nephrology that this was normal in the setting of her renal failure Medications reviewed She does have diet controlled diabetes mellitus as well as hypothyroidism both levels have been stable Reviewed last cardiology and vascular note (she had pseudoaneurysm of fistula) - she was evaluated for chest pain in setting of CHF, Renal disease, had Lexiscan done which was normal   She was briefly seen by her eye doctor her right eye has been declining she has follow-up in 2 months they do not make any changes today they are concerned about her losing vision in the right eye   Review Of Systems:  GEN- denies fatigue, fever, weight loss,weakness, recent illness HEENT- denies eye drainage,+ change in vision, nasal discharge, CVS- denies chest pain, palpitations RESP- denies SOB, cough, wheeze ABD- denies N/V, change in stools, abd pain GU- denies dysuria, hematuria, dribbling, incontinence MSK- + joint pain, denies  muscle aches, injury Neuro- denies headache, dizziness, syncope, seizure activity       Objective:    BP 134/64 (BP Location: Right Arm, Patient Position: Sitting, Cuff Size: Large)   Pulse 84   Temp 98.9 F (37.2 C) (Oral)   Resp 20   Ht 5\' 6"  (1.676 m)   Wt 190 lb (86.2 kg)   LMP 12/24/2010   BMI 30.67 kg/m  GEN- NAD, alert and oriented x3,walks with walker HEENT- PERRL, EOMI, non injected sclera, pink conjunctiva, MMM, oropharynx clear Neck- Supple, no thyromegaly CVS- RRR,  3/6 SEM  RESP-CTAB ABD-NABS,soft,NT,ND, no CVA tenderess EXT- No edema Pulses- Radial, DP- 2+        Assessment  & Plan:      Problem List Items Addressed This Visit    ESRD (end stage renal disease) on dialysis ([place]) - Primary    Followed by nephrology no sign of obstruction or infection symptoms, decreased urine production in setting of dialysis, she was not able to give sample today, did urinate earlier today  Looks well       Diabetes mellitus with renal complications (HCC)    Diabests is diet controlled, no changes needed      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     Note: This dictation was prepared with Dragon dictation along with smaller phrase technology. Any transcriptional errors that result from this process are unintentional.

## 2015-08-26 NOTE — Assessment & Plan Note (Signed)
Well controlled, reviewed cardiology notes and vascular

## 2015-08-26 NOTE — Patient Instructions (Signed)
F/U 6 months

## 2015-08-26 NOTE — Assessment & Plan Note (Signed)
Followed by nephrology no sign of obstruction or infection symptoms, decreased urine production in setting of dialysis, she was not able to give sample today, did urinate earlier today  Looks well

## 2015-08-26 NOTE — Assessment & Plan Note (Signed)
Diabests is diet controlled, no changes needed

## 2015-08-27 DIAGNOSIS — Z992 Dependence on renal dialysis: Secondary | ICD-10-CM | POA: Diagnosis not present

## 2015-08-27 DIAGNOSIS — N186 End stage renal disease: Secondary | ICD-10-CM | POA: Diagnosis not present

## 2015-08-27 DIAGNOSIS — F0390 Unspecified dementia without behavioral disturbance: Secondary | ICD-10-CM | POA: Diagnosis not present

## 2015-08-28 DIAGNOSIS — F0390 Unspecified dementia without behavioral disturbance: Secondary | ICD-10-CM | POA: Diagnosis not present

## 2015-08-29 DIAGNOSIS — N186 End stage renal disease: Secondary | ICD-10-CM | POA: Diagnosis not present

## 2015-08-29 DIAGNOSIS — Z992 Dependence on renal dialysis: Secondary | ICD-10-CM | POA: Diagnosis not present

## 2015-08-29 DIAGNOSIS — F0390 Unspecified dementia without behavioral disturbance: Secondary | ICD-10-CM | POA: Diagnosis not present

## 2015-08-30 DIAGNOSIS — F0390 Unspecified dementia without behavioral disturbance: Secondary | ICD-10-CM | POA: Diagnosis not present

## 2015-08-31 DIAGNOSIS — F0390 Unspecified dementia without behavioral disturbance: Secondary | ICD-10-CM | POA: Diagnosis not present

## 2015-09-01 DIAGNOSIS — Z992 Dependence on renal dialysis: Secondary | ICD-10-CM | POA: Diagnosis not present

## 2015-09-01 DIAGNOSIS — F0390 Unspecified dementia without behavioral disturbance: Secondary | ICD-10-CM | POA: Diagnosis not present

## 2015-09-01 DIAGNOSIS — N186 End stage renal disease: Secondary | ICD-10-CM | POA: Diagnosis not present

## 2015-09-02 DIAGNOSIS — F0390 Unspecified dementia without behavioral disturbance: Secondary | ICD-10-CM | POA: Diagnosis not present

## 2015-09-03 DIAGNOSIS — Z992 Dependence on renal dialysis: Secondary | ICD-10-CM | POA: Diagnosis not present

## 2015-09-03 DIAGNOSIS — N186 End stage renal disease: Secondary | ICD-10-CM | POA: Diagnosis not present

## 2015-09-03 DIAGNOSIS — F0390 Unspecified dementia without behavioral disturbance: Secondary | ICD-10-CM | POA: Diagnosis not present

## 2015-09-04 ENCOUNTER — Other Ambulatory Visit: Payer: Self-pay | Admitting: Family Medicine

## 2015-09-04 DIAGNOSIS — F0390 Unspecified dementia without behavioral disturbance: Secondary | ICD-10-CM | POA: Diagnosis not present

## 2015-09-04 NOTE — Telephone Encounter (Signed)
Refill appropriate and filled per protocol. 

## 2015-09-05 DIAGNOSIS — F0390 Unspecified dementia without behavioral disturbance: Secondary | ICD-10-CM | POA: Diagnosis not present

## 2015-09-05 DIAGNOSIS — N186 End stage renal disease: Secondary | ICD-10-CM | POA: Diagnosis not present

## 2015-09-05 DIAGNOSIS — Z992 Dependence on renal dialysis: Secondary | ICD-10-CM | POA: Diagnosis not present

## 2015-09-06 DIAGNOSIS — F0390 Unspecified dementia without behavioral disturbance: Secondary | ICD-10-CM | POA: Diagnosis not present

## 2015-09-07 DIAGNOSIS — F0390 Unspecified dementia without behavioral disturbance: Secondary | ICD-10-CM | POA: Diagnosis not present

## 2015-09-08 DIAGNOSIS — N186 End stage renal disease: Secondary | ICD-10-CM | POA: Diagnosis not present

## 2015-09-08 DIAGNOSIS — F0390 Unspecified dementia without behavioral disturbance: Secondary | ICD-10-CM | POA: Diagnosis not present

## 2015-09-08 DIAGNOSIS — Z992 Dependence on renal dialysis: Secondary | ICD-10-CM | POA: Diagnosis not present

## 2015-09-09 DIAGNOSIS — F0390 Unspecified dementia without behavioral disturbance: Secondary | ICD-10-CM | POA: Diagnosis not present

## 2015-09-10 DIAGNOSIS — Z992 Dependence on renal dialysis: Secondary | ICD-10-CM | POA: Diagnosis not present

## 2015-09-10 DIAGNOSIS — N186 End stage renal disease: Secondary | ICD-10-CM | POA: Diagnosis not present

## 2015-09-10 DIAGNOSIS — F0390 Unspecified dementia without behavioral disturbance: Secondary | ICD-10-CM | POA: Diagnosis not present

## 2015-09-11 DIAGNOSIS — F0390 Unspecified dementia without behavioral disturbance: Secondary | ICD-10-CM | POA: Diagnosis not present

## 2015-09-11 DIAGNOSIS — Z992 Dependence on renal dialysis: Secondary | ICD-10-CM | POA: Diagnosis not present

## 2015-09-11 DIAGNOSIS — N186 End stage renal disease: Secondary | ICD-10-CM | POA: Diagnosis not present

## 2015-09-12 DIAGNOSIS — Z992 Dependence on renal dialysis: Secondary | ICD-10-CM | POA: Diagnosis not present

## 2015-09-12 DIAGNOSIS — N186 End stage renal disease: Secondary | ICD-10-CM | POA: Diagnosis not present

## 2015-09-12 DIAGNOSIS — Z23 Encounter for immunization: Secondary | ICD-10-CM | POA: Diagnosis not present

## 2015-09-12 DIAGNOSIS — F0390 Unspecified dementia without behavioral disturbance: Secondary | ICD-10-CM | POA: Diagnosis not present

## 2015-09-13 DIAGNOSIS — F0390 Unspecified dementia without behavioral disturbance: Secondary | ICD-10-CM | POA: Diagnosis not present

## 2015-09-14 DIAGNOSIS — F0390 Unspecified dementia without behavioral disturbance: Secondary | ICD-10-CM | POA: Diagnosis not present

## 2015-09-15 DIAGNOSIS — N186 End stage renal disease: Secondary | ICD-10-CM | POA: Diagnosis not present

## 2015-09-15 DIAGNOSIS — Z992 Dependence on renal dialysis: Secondary | ICD-10-CM | POA: Diagnosis not present

## 2015-09-15 DIAGNOSIS — Z23 Encounter for immunization: Secondary | ICD-10-CM | POA: Diagnosis not present

## 2015-09-15 DIAGNOSIS — F0390 Unspecified dementia without behavioral disturbance: Secondary | ICD-10-CM | POA: Diagnosis not present

## 2015-09-16 DIAGNOSIS — F0390 Unspecified dementia without behavioral disturbance: Secondary | ICD-10-CM | POA: Diagnosis not present

## 2015-09-17 DIAGNOSIS — Z23 Encounter for immunization: Secondary | ICD-10-CM | POA: Diagnosis not present

## 2015-09-17 DIAGNOSIS — N186 End stage renal disease: Secondary | ICD-10-CM | POA: Diagnosis not present

## 2015-09-17 DIAGNOSIS — F0390 Unspecified dementia without behavioral disturbance: Secondary | ICD-10-CM | POA: Diagnosis not present

## 2015-09-17 DIAGNOSIS — Z992 Dependence on renal dialysis: Secondary | ICD-10-CM | POA: Diagnosis not present

## 2015-09-18 DIAGNOSIS — F0390 Unspecified dementia without behavioral disturbance: Secondary | ICD-10-CM | POA: Diagnosis not present

## 2015-09-19 DIAGNOSIS — F0390 Unspecified dementia without behavioral disturbance: Secondary | ICD-10-CM | POA: Diagnosis not present

## 2015-09-19 DIAGNOSIS — Z992 Dependence on renal dialysis: Secondary | ICD-10-CM | POA: Diagnosis not present

## 2015-09-19 DIAGNOSIS — N186 End stage renal disease: Secondary | ICD-10-CM | POA: Diagnosis not present

## 2015-09-19 DIAGNOSIS — Z23 Encounter for immunization: Secondary | ICD-10-CM | POA: Diagnosis not present

## 2015-09-20 DIAGNOSIS — F0390 Unspecified dementia without behavioral disturbance: Secondary | ICD-10-CM | POA: Diagnosis not present

## 2015-09-21 DIAGNOSIS — F0390 Unspecified dementia without behavioral disturbance: Secondary | ICD-10-CM | POA: Diagnosis not present

## 2015-09-22 DIAGNOSIS — F0390 Unspecified dementia without behavioral disturbance: Secondary | ICD-10-CM | POA: Diagnosis not present

## 2015-09-22 DIAGNOSIS — Z23 Encounter for immunization: Secondary | ICD-10-CM | POA: Diagnosis not present

## 2015-09-22 DIAGNOSIS — N186 End stage renal disease: Secondary | ICD-10-CM | POA: Diagnosis not present

## 2015-09-22 DIAGNOSIS — Z992 Dependence on renal dialysis: Secondary | ICD-10-CM | POA: Diagnosis not present

## 2015-09-23 DIAGNOSIS — F0390 Unspecified dementia without behavioral disturbance: Secondary | ICD-10-CM | POA: Diagnosis not present

## 2015-09-24 ENCOUNTER — Other Ambulatory Visit: Payer: Self-pay | Admitting: Family Medicine

## 2015-09-24 DIAGNOSIS — R159 Full incontinence of feces: Secondary | ICD-10-CM | POA: Diagnosis not present

## 2015-09-24 DIAGNOSIS — R32 Unspecified urinary incontinence: Secondary | ICD-10-CM | POA: Diagnosis not present

## 2015-09-24 DIAGNOSIS — N186 End stage renal disease: Secondary | ICD-10-CM | POA: Diagnosis not present

## 2015-09-24 DIAGNOSIS — Z23 Encounter for immunization: Secondary | ICD-10-CM | POA: Diagnosis not present

## 2015-09-24 DIAGNOSIS — Z992 Dependence on renal dialysis: Secondary | ICD-10-CM | POA: Diagnosis not present

## 2015-09-24 DIAGNOSIS — F0390 Unspecified dementia without behavioral disturbance: Secondary | ICD-10-CM | POA: Diagnosis not present

## 2015-09-25 DIAGNOSIS — F0390 Unspecified dementia without behavioral disturbance: Secondary | ICD-10-CM | POA: Diagnosis not present

## 2015-09-26 DIAGNOSIS — F0390 Unspecified dementia without behavioral disturbance: Secondary | ICD-10-CM | POA: Diagnosis not present

## 2015-09-26 DIAGNOSIS — Z992 Dependence on renal dialysis: Secondary | ICD-10-CM | POA: Diagnosis not present

## 2015-09-26 DIAGNOSIS — N186 End stage renal disease: Secondary | ICD-10-CM | POA: Diagnosis not present

## 2015-09-26 DIAGNOSIS — Z23 Encounter for immunization: Secondary | ICD-10-CM | POA: Diagnosis not present

## 2015-09-27 DIAGNOSIS — F0390 Unspecified dementia without behavioral disturbance: Secondary | ICD-10-CM | POA: Diagnosis not present

## 2015-09-28 DIAGNOSIS — F0390 Unspecified dementia without behavioral disturbance: Secondary | ICD-10-CM | POA: Diagnosis not present

## 2015-09-29 DIAGNOSIS — Z23 Encounter for immunization: Secondary | ICD-10-CM | POA: Diagnosis not present

## 2015-09-29 DIAGNOSIS — N186 End stage renal disease: Secondary | ICD-10-CM | POA: Diagnosis not present

## 2015-09-29 DIAGNOSIS — F0390 Unspecified dementia without behavioral disturbance: Secondary | ICD-10-CM | POA: Diagnosis not present

## 2015-09-29 DIAGNOSIS — Z992 Dependence on renal dialysis: Secondary | ICD-10-CM | POA: Diagnosis not present

## 2015-09-30 ENCOUNTER — Other Ambulatory Visit: Payer: Self-pay | Admitting: Family Medicine

## 2015-09-30 ENCOUNTER — Telehealth: Payer: Self-pay | Admitting: Family Medicine

## 2015-09-30 DIAGNOSIS — E1122 Type 2 diabetes mellitus with diabetic chronic kidney disease: Secondary | ICD-10-CM | POA: Diagnosis not present

## 2015-09-30 DIAGNOSIS — Z79899 Other long term (current) drug therapy: Secondary | ICD-10-CM | POA: Diagnosis not present

## 2015-09-30 DIAGNOSIS — E039 Hypothyroidism, unspecified: Secondary | ICD-10-CM | POA: Diagnosis not present

## 2015-09-30 DIAGNOSIS — Z8543 Personal history of malignant neoplasm of ovary: Secondary | ICD-10-CM | POA: Diagnosis not present

## 2015-09-30 DIAGNOSIS — R0682 Tachypnea, not elsewhere classified: Secondary | ICD-10-CM | POA: Diagnosis not present

## 2015-09-30 DIAGNOSIS — F329 Major depressive disorder, single episode, unspecified: Secondary | ICD-10-CM | POA: Diagnosis not present

## 2015-09-30 DIAGNOSIS — J441 Chronic obstructive pulmonary disease with (acute) exacerbation: Secondary | ICD-10-CM | POA: Diagnosis not present

## 2015-09-30 DIAGNOSIS — Z992 Dependence on renal dialysis: Secondary | ICD-10-CM | POA: Diagnosis not present

## 2015-09-30 DIAGNOSIS — I129 Hypertensive chronic kidney disease with stage 1 through stage 4 chronic kidney disease, or unspecified chronic kidney disease: Secondary | ICD-10-CM | POA: Diagnosis not present

## 2015-09-30 DIAGNOSIS — F0390 Unspecified dementia without behavioral disturbance: Secondary | ICD-10-CM | POA: Diagnosis not present

## 2015-09-30 DIAGNOSIS — N189 Chronic kidney disease, unspecified: Secondary | ICD-10-CM | POA: Diagnosis not present

## 2015-09-30 DIAGNOSIS — R0602 Shortness of breath: Secondary | ICD-10-CM | POA: Diagnosis not present

## 2015-09-30 NOTE — Telephone Encounter (Signed)
Please triage pt, if she is having SOB , she needs to go to ER She is dialysis pt and elderly

## 2015-09-30 NOTE — Telephone Encounter (Signed)
[name] calling from [facility] calling to see if chest xray can be ordered for this patient, she is having a very hard time breathing today, please call her at  [phone]

## 2015-10-01 DIAGNOSIS — Z992 Dependence on renal dialysis: Secondary | ICD-10-CM | POA: Diagnosis not present

## 2015-10-01 DIAGNOSIS — Z23 Encounter for immunization: Secondary | ICD-10-CM | POA: Diagnosis not present

## 2015-10-01 DIAGNOSIS — N186 End stage renal disease: Secondary | ICD-10-CM | POA: Diagnosis not present

## 2015-10-01 DIAGNOSIS — F0390 Unspecified dementia without behavioral disturbance: Secondary | ICD-10-CM | POA: Diagnosis not present

## 2015-10-01 NOTE — Telephone Encounter (Signed)
Call placed to facility.   Patient was taken to ER on 09/30/2015 and returned to facility with Dx of COPD exacerbation.

## 2015-10-02 ENCOUNTER — Encounter (HOSPITAL_COMMUNITY): Payer: Self-pay

## 2015-10-02 ENCOUNTER — Observation Stay (HOSPITAL_COMMUNITY)
Admission: EM | Admit: 2015-10-02 | Discharge: 2015-10-04 | Disposition: A | Discharge status: Home / Self Care | Payer: Commercial Managed Care - HMO | Attending: Family Medicine | Admitting: Family Medicine

## 2015-10-02 ENCOUNTER — Emergency Department (HOSPITAL_COMMUNITY): Payer: Commercial Managed Care - HMO

## 2015-10-02 DIAGNOSIS — E8770 Fluid overload, unspecified: Secondary | ICD-10-CM

## 2015-10-02 DIAGNOSIS — R791 Abnormal coagulation profile: Principal | ICD-10-CM | POA: Insufficient documentation

## 2015-10-02 DIAGNOSIS — R739 Hyperglycemia, unspecified: Secondary | ICD-10-CM

## 2015-10-02 DIAGNOSIS — I5033 Acute on chronic diastolic (congestive) heart failure: Secondary | ICD-10-CM | POA: Diagnosis not present

## 2015-10-02 DIAGNOSIS — E119 Type 2 diabetes mellitus without complications: Secondary | ICD-10-CM | POA: Diagnosis not present

## 2015-10-02 DIAGNOSIS — Z8673 Personal history of transient ischemic attack (TIA), and cerebral infarction without residual deficits: Secondary | ICD-10-CM | POA: Insufficient documentation

## 2015-10-02 DIAGNOSIS — Z79899 Other long term (current) drug therapy: Secondary | ICD-10-CM | POA: Insufficient documentation

## 2015-10-02 DIAGNOSIS — R0602 Shortness of breath: Secondary | ICD-10-CM

## 2015-10-02 DIAGNOSIS — R001 Bradycardia, unspecified: Secondary | ICD-10-CM | POA: Diagnosis not present

## 2015-10-02 DIAGNOSIS — R7989 Other specified abnormal findings of blood chemistry: Secondary | ICD-10-CM

## 2015-10-02 DIAGNOSIS — Z7982 Long term (current) use of aspirin: Secondary | ICD-10-CM | POA: Insufficient documentation

## 2015-10-02 DIAGNOSIS — R0600 Dyspnea, unspecified: Secondary | ICD-10-CM

## 2015-10-02 DIAGNOSIS — I132 Hypertensive heart and chronic kidney disease with heart failure and with stage 5 chronic kidney disease, or end stage renal disease: Secondary | ICD-10-CM | POA: Diagnosis not present

## 2015-10-02 DIAGNOSIS — N185 Chronic kidney disease, stage 5: Secondary | ICD-10-CM

## 2015-10-02 DIAGNOSIS — Z992 Dependence on renal dialysis: Secondary | ICD-10-CM

## 2015-10-02 DIAGNOSIS — N186 End stage renal disease: Secondary | ICD-10-CM

## 2015-10-02 DIAGNOSIS — J9611 Chronic respiratory failure with hypoxia: Secondary | ICD-10-CM

## 2015-10-02 DIAGNOSIS — E038 Other specified hypothyroidism: Secondary | ICD-10-CM

## 2015-10-02 DIAGNOSIS — R069 Unspecified abnormalities of breathing: Secondary | ICD-10-CM | POA: Diagnosis not present

## 2015-10-02 DIAGNOSIS — F32A Depression, unspecified: Secondary | ICD-10-CM

## 2015-10-02 DIAGNOSIS — I1311 Hypertensive heart and chronic kidney disease without heart failure, with stage 5 chronic kidney disease, or end stage renal disease: Secondary | ICD-10-CM | POA: Diagnosis not present

## 2015-10-02 DIAGNOSIS — J9610 Chronic respiratory failure, unspecified whether with hypoxia or hypercapnia: Secondary | ICD-10-CM | POA: Diagnosis present

## 2015-10-02 DIAGNOSIS — I509 Heart failure, unspecified: Secondary | ICD-10-CM | POA: Insufficient documentation

## 2015-10-02 DIAGNOSIS — F329 Major depressive disorder, single episode, unspecified: Secondary | ICD-10-CM

## 2015-10-02 DIAGNOSIS — E039 Hypothyroidism, unspecified: Secondary | ICD-10-CM | POA: Diagnosis present

## 2015-10-02 HISTORY — DX: Syncope and collapse: R55

## 2015-10-02 HISTORY — DX: Anemia, unspecified: D64.9

## 2015-10-02 HISTORY — DX: Intraoperative hemorrhage and hematoma of a digestive system organ or structure complicating a digestive system procedure: K91.61

## 2015-10-02 LAB — CBC WITH DIFFERENTIAL/PLATELET
BASOS ABS: 0 10*3/uL (ref 0.0–0.1)
BASOS PCT: 0 %
EOS ABS: 0 10*3/uL (ref 0.0–0.7)
Eosinophils Relative: 0 %
HEMATOCRIT: 32.7 % — AB (ref 36.0–46.0)
HEMOGLOBIN: 10.5 g/dL — AB (ref 12.0–15.0)
Lymphocytes Relative: 10 %
Lymphs Abs: 1 10*3/uL (ref 0.7–4.0)
MCH: 25.4 pg — ABNORMAL LOW (ref 26.0–34.0)
MCHC: 32.1 g/dL (ref 30.0–36.0)
MCV: 79 fL (ref 78.0–100.0)
MONOS PCT: 3 %
Monocytes Absolute: 0.2 10*3/uL (ref 0.1–1.0)
NEUTROS ABS: 8.1 10*3/uL — AB (ref 1.7–7.7)
NEUTROS PCT: 87 %
Platelets: 197 10*3/uL (ref 150–400)
RBC: 4.14 MIL/uL (ref 3.87–5.11)
RDW: 14.1 % (ref 11.5–15.5)
WBC: 9.3 10*3/uL (ref 4.0–10.5)

## 2015-10-02 LAB — BASIC METABOLIC PANEL
ANION GAP: 12 (ref 5–15)
BUN: 72 mg/dL — ABNORMAL HIGH (ref 6–20)
CHLORIDE: 93 mmol/L — AB (ref 101–111)
CO2: 29 mmol/L (ref 22–32)
Calcium: 9.2 mg/dL (ref 8.9–10.3)
Creatinine, Ser: 7.5 mg/dL — ABNORMAL HIGH (ref 0.44–1.00)
GFR calc Af Amer: 5 mL/min — ABNORMAL LOW (ref >60)
GFR, EST NON AFRICAN AMERICAN: 4 mL/min — AB (ref >60)
GLUCOSE: 223 mg/dL — AB (ref 65–99)
POTASSIUM: 4.4 mmol/L (ref 3.5–5.1)
Sodium: 134 mmol/L — ABNORMAL LOW (ref 135–145)

## 2015-10-02 LAB — D-DIMER, QUANTITATIVE: D-Dimer, Quant: 0.97 ug/mL-FEU — ABNORMAL HIGH (ref 0.00–0.50)

## 2015-10-02 LAB — MRSA PCR SCREENING: MRSA by PCR: NEGATIVE

## 2015-10-02 LAB — TROPONIN I: Troponin I: 0.04 ng/mL (ref <0.03)

## 2015-10-02 LAB — BRAIN NATRIURETIC PEPTIDE: B Natriuretic Peptide: 1135 pg/mL — ABNORMAL HIGH (ref 0.0–100.0)

## 2015-10-02 LAB — GLUCOSE, CAPILLARY: GLUCOSE-CAPILLARY: 256 mg/dL — AB (ref 65–99)

## 2015-10-02 MED ORDER — IPRATROPIUM-ALBUTEROL 0.5-2.5 (3) MG/3ML IN SOLN
3.0000 mL | RESPIRATORY_TRACT | Status: DC | PRN
  Administered 2015-10-04: 3 mL via RESPIRATORY_TRACT
  Filled 2015-10-02: qty 3

## 2015-10-02 MED ORDER — POLYETHYLENE GLYCOL 3350 17 GM/SCOOP PO POWD
17.0000 g | Freq: Every day | ORAL | Status: DC
  Filled 2015-10-02: qty 255

## 2015-10-02 MED ORDER — METHYLPREDNISOLONE SODIUM SUCC 125 MG IJ SOLR
125.0000 mg | Freq: Once | INTRAMUSCULAR | Status: AC
  Administered 2015-10-02: 125 mg via INTRAVENOUS
  Filled 2015-10-02: qty 2

## 2015-10-02 MED ORDER — HEPARIN SODIUM (PORCINE) 5000 UNIT/ML IJ SOLN
5000.0000 [IU] | Freq: Three times a day (TID) | INTRAMUSCULAR | Status: DC
  Administered 2015-10-02 – 2015-10-04 (×4): 5000 [IU] via SUBCUTANEOUS
  Filled 2015-10-02 (×4): qty 1

## 2015-10-02 MED ORDER — INSULIN ASPART 100 UNIT/ML ~~LOC~~ SOLN
0.0000 [IU] | Freq: Three times a day (TID) | SUBCUTANEOUS | Status: DC
  Administered 2015-10-03 – 2015-10-04 (×2): 2 [IU] via SUBCUTANEOUS

## 2015-10-02 MED ORDER — PREDNISONE 20 MG PO TABS: 20.0000 mg | ORAL_TABLET | Freq: Every day | ORAL | Status: DC

## 2015-10-02 MED ORDER — CITALOPRAM HYDROBROMIDE 20 MG PO TABS
20.0000 mg | ORAL_TABLET | Freq: Every day | ORAL | Status: DC
Start: 2015-10-03 — End: 2015-10-04
  Administered 2015-10-03 – 2015-10-04 (×2): 20 mg via ORAL
  Filled 2015-10-02 (×2): qty 1

## 2015-10-02 MED ORDER — SEVELAMER CARBONATE 800 MG PO TABS
2400.0000 mg | ORAL_TABLET | Freq: Three times a day (TID) | ORAL | Status: DC
  Administered 2015-10-03 – 2015-10-04 (×4): 2400 mg via ORAL
  Filled 2015-10-02 (×5): qty 3

## 2015-10-02 MED ORDER — DOCUSATE SODIUM 100 MG PO CAPS
100.0000 mg | ORAL_CAPSULE | Freq: Two times a day (BID) | ORAL | Status: DC
  Administered 2015-10-02 – 2015-10-04 (×4): 100 mg via ORAL
  Filled 2015-10-02 (×4): qty 1

## 2015-10-02 MED ORDER — INSULIN ASPART 100 UNIT/ML ~~LOC~~ SOLN
0.0000 [IU] | Freq: Every day | SUBCUTANEOUS | Status: DC
  Administered 2015-10-02: 3 [IU] via SUBCUTANEOUS

## 2015-10-02 MED ORDER — CINACALCET HCL 30 MG PO TABS
30.0000 mg | ORAL_TABLET | Freq: Every day | ORAL | Status: DC
  Administered 2015-10-02 – 2015-10-03 (×2): 30 mg via ORAL
  Filled 2015-10-02 (×4): qty 1

## 2015-10-02 MED ORDER — SODIUM CHLORIDE 0.9% FLUSH
3.0000 mL | Freq: Two times a day (BID) | INTRAVENOUS | Status: DC
  Administered 2015-10-03 – 2015-10-04 (×3): 3 mL via INTRAVENOUS

## 2015-10-02 MED ORDER — ACETAMINOPHEN 325 MG PO TABS: 650.0000 mg | ORAL_TABLET | ORAL | Status: DC | PRN

## 2015-10-02 MED ORDER — ONDANSETRON HCL 4 MG/2ML IJ SOLN: 4.0000 mg | Freq: Four times a day (QID) | INTRAMUSCULAR | Status: DC | PRN

## 2015-10-02 MED ORDER — LATANOPROST 0.005 % OP SOLN
1.0000 [drp] | Freq: Every day | OPHTHALMIC | Status: DC
  Administered 2015-10-02 – 2015-10-03 (×2): 1 [drp] via OPHTHALMIC
  Filled 2015-10-02: qty 2.5

## 2015-10-02 MED ORDER — LATANOPROST 0.005 % OP SOLN
OPHTHALMIC | Status: AC
  Filled 2015-10-02: qty 2.5

## 2015-10-02 MED ORDER — SODIUM CHLORIDE 0.9% FLUSH: 3.0000 mL | INTRAVENOUS | Status: DC | PRN

## 2015-10-02 MED ORDER — SODIUM CHLORIDE 0.9 % IV SOLN: 250.0000 mL | INTRAVENOUS | Status: DC | PRN

## 2015-10-02 MED ORDER — POLYETHYL GLYCOL-PROPYL GLYCOL 0.4-0.3 % OP GEL: Freq: Four times a day (QID) | OPHTHALMIC | Status: DC | PRN

## 2015-10-02 MED ORDER — LEVOTHYROXINE SODIUM 50 MCG PO TABS
25.0000 ug | ORAL_TABLET | Freq: Every day | ORAL | Status: DC
  Administered 2015-10-03 – 2015-10-04 (×2): 25 ug via ORAL
  Filled 2015-10-02 (×2): qty 1

## 2015-10-02 MED ORDER — FAMOTIDINE 20 MG PO TABS
20.0000 mg | ORAL_TABLET | Freq: Every day | ORAL | Status: DC
  Administered 2015-10-02 – 2015-10-03 (×2): 20 mg via ORAL
  Filled 2015-10-02 (×2): qty 1

## 2015-10-02 MED ORDER — IPRATROPIUM-ALBUTEROL 0.5-2.5 (3) MG/3ML IN SOLN
3.0000 mL | Freq: Once | RESPIRATORY_TRACT | Status: AC
  Administered 2015-10-02: 3 mL via RESPIRATORY_TRACT
  Filled 2015-10-02: qty 3

## 2015-10-02 MED ORDER — ISOSORBIDE MONONITRATE ER 60 MG PO TB24
60.0000 mg | ORAL_TABLET | Freq: Every day | ORAL | Status: DC
Start: 2015-10-03 — End: 2015-10-04
  Administered 2015-10-03 – 2015-10-04 (×2): 60 mg via ORAL
  Filled 2015-10-02 (×2): qty 1

## 2015-10-02 MED ORDER — ASPIRIN EC 81 MG PO TBEC
81.0000 mg | DELAYED_RELEASE_TABLET | Freq: Every day | ORAL | Status: DC
  Administered 2015-10-03 – 2015-10-04 (×2): 81 mg via ORAL
  Filled 2015-10-02 (×2): qty 1

## 2015-10-02 MED ORDER — POLYVINYL ALCOHOL 1.4 % OP SOLN: 1.0000 [drp] | Freq: Four times a day (QID) | OPHTHALMIC | Status: DC | PRN

## 2015-10-02 MED ORDER — AMLODIPINE BESYLATE 5 MG PO TABS
10.0000 mg | ORAL_TABLET | Freq: Every day | ORAL | Status: DC
  Administered 2015-10-03 – 2015-10-04 (×2): 10 mg via ORAL
  Filled 2015-10-02 (×3): qty 2

## 2015-10-02 MED ORDER — SEVELAMER CARBONATE 800 MG PO TABS: 800.0000 mg | ORAL_TABLET | Freq: Two times a day (BID) | ORAL | Status: DC | PRN

## 2015-10-02 MED ORDER — FUROSEMIDE 10 MG/ML IJ SOLN
80.0000 mg | Freq: Once | INTRAMUSCULAR | Status: AC
  Administered 2015-10-02: 80 mg via INTRAVENOUS
  Filled 2015-10-02: qty 8

## 2015-10-02 MED ORDER — MIRTAZAPINE 15 MG PO TABS
7.5000 mg | ORAL_TABLET | Freq: Every day | ORAL | Status: DC
  Administered 2015-10-02 – 2015-10-03 (×2): 7.5 mg via ORAL
  Filled 2015-10-02 (×2): qty 1

## 2015-10-02 NOTE — ED Provider Notes (Signed)
[facility] DEPT Provider Note   CSN: [id number] Arrival date & time: 10/02/15  1442     History   Chief Complaint Chief Complaint  Patient presents with  . Shortness of Breath    HPI [name] is a 82 y.o. female.   Shortness of Breath  This is a recurrent problem. The average episode lasts 3 days. The problem occurs rarely.The current episode started more than 2 days ago. The problem has not changed since onset.Pertinent negatives include no fever, no ear pain and no orthopnea.    Past Medical History:  Diagnosis Date  . Anemia   . Anemia in CKD (chronic kidney disease)   . Anxiety   . Arthritis    gout, right shoulder  . Cancer [facility])    ?stomach cancer   . Cataracts, both eyes   . CHF (congestive heart failure) ([place])   . CKD (chronic kidney disease), stage V ([place])    on HD on M/W/F  . Depression   . Diabetes mellitus, type 2 ([place])   . GERD (gastroesophageal reflux disease)   . Gynecologic cancer   . Hyperlipidemia   . Hypertension   . IBS (irritable bowel syndrome)    with costipation  . Intraoperative hemorrhage and hematoma of a digestive system organ or structure complicating a digestive system procedure   . Overactive bladder   . Pneumonia    2 times in 2016  . SOB (shortness of breath)    nml spirometry 09/05/06  . Stroke [facility])    residual right side weakness and pain  . SVT (supraventricular tachycardia) ([place])   . Syncope and collapse   . Thyroid disease     Patient Active Problem List   Diagnosis Date Noted  . Fluid overload 10/02/2015  . Hyperglycemia 10/02/2015  . Pseudoaneurysm of arteriovenous dialysis fistula ([place]) 05/29/2015  . Traumatic hematoma of scalp 02/12/2014  . Decreased hearing of both ears 02/12/2014  . At high risk for falls 02/12/2014  . Leg swelling 09/18/2013  . OA (osteoarthritis) of knee 09/18/2013  . MDD (major depressive disorder) ([place]) 04/05/2013  . Mechanical complication of other vascular device,  implant, and graft 11/14/2012  . Pain in limb-Left arm 07/11/2012  . Hypothyroidism 06/08/2012  . Heartburn 03/23/2012  . Gait instability 01/13/2012  . Neuropathy of hand 01/13/2012  . Breast mass, right 07/11/2011  . Hemorrhoid 03/14/2011  . ESRD (end stage renal disease) on dialysis ([place]) 02/12/2011  . Bradycardia 02/01/2011  . Acute on chronic diastolic CHF (congestive heart failure) ([place]) 01/31/2011  . Chronic respiratory failure ([place]) 01/13/2011  . Constipation 12/09/2010  . Insomnia 10/14/2010  . Diabetes mellitus with renal complications ([place]) 70/26/3785  . ALLERGIC RHINITIS 04/03/2008  . Benign hypertensive heart and kidney disease and CKD stage V ([place]) 08/07/2007  . DEPENDENT EDEMA, LEGS, BILATERAL 11/17/2006  . HYPERLIPIDEMIA 10/10/2006  . ANEMIA OF RENAL FAILURE 10/10/2006  . ANXIETY 09/05/2006  . CATARACT NOS 09/05/2006  . IBS 09/05/2006  . ARTHRITIS 09/05/2006    Past Surgical History:  Procedure Laterality Date  . AV FISTULA PLACEMENT, BRACHIOCEPHALIC  88/50/2774   left arm   by Dr. Bridgett Larsson  . COLONOSCOPY    . FISTULA SUPERFICIALIZATION Left 06/10/2015   Procedure: LEFT UPPER ARM BRACHIOCEPHALIC FISTULA PSEUDOANEURYSM PLICATION;  Surgeon: Conrad Monroe, MD;  Location: [facility];  Service: Vascular;  Laterality: Left;  . PERIPHERAL VASCULAR CATHETERIZATION Left 05/15/2015   Procedure: Fistulagram;  Surgeon: Conrad Stringtown, MD;  Location: [facility]  CV LAB;  Service: Cardiovascular;  Laterality: Left;  . TEE WITHOUT CARDIOVERSION  2011   2/2 Strep Bovis infection  . VESICOVAGINAL FISTULA CLOSURE W/ TAH      OB History    No data available       Home Medications    Prior to Admission medications   Medication Sig Start Date End Date Taking? Authorizing Provider  albuterol (PROVENTIL HFA;VENTOLIN HFA) 108 (90 Base) MCG/ACT inhaler Inhale 2 puffs into the lungs every 4 (four) hours as needed for wheezing or shortness of breath.   Yes Historical Provider, MD  albuterol  (PROVENTIL) (2.5 MG/3ML) 0.083% nebulizer solution USE 1 VIAL IN NEBULIZER 3 TIMES DAILY AS NEEDED FOR WHEEZING/SHORTNESS OF BREATH. 10/01/15  Yes Alycia Rossetti, MD  amLODipine (NORVASC) 10 MG tablet Take 1 tablet (10 mg total) by mouth every morning. 04/30/15 11/25/17 Yes Alycia Rossetti, MD  ASPIRIN LOW DOSE 81 MG EC tablet TAKE (1) TABLET BY MOUTH ONCE DAILY. 09/25/15  Yes Alycia Rossetti, MD  bisacodyl (DULCOLAX) 10 MG suppository 1 suppository rectally  Daily every 3 days as needed for constipation Patient taking differently: Place 10 mg rectally daily as needed for moderate constipation.  03/21/12  Yes Alycia Rossetti, MD  Blood Glucose Calibration (ACCU-CHEK SMARTVIEW CONTROL) LIQD Use to monitor FSBS 1 time daily. DX: 250.00 08/15/13  Yes Alycia Rossetti, MD  Blood Glucose Monitoring Suppl (ACCU-CHEK NANO SMARTVIEW) W/DEVICE KIT Use to monitor FSBS 1 time daily. DX: 250.00 08/15/13  Yes Alycia Rossetti, MD  citalopram (CELEXA) 20 MG tablet Take 1 tablet (20 mg total) by mouth every morning. 04/30/15  Yes Alycia Rossetti, MD  docusate sodium (COLACE) 100 MG capsule Take 1 capsule (100 mg total) by mouth 2 (two) times daily. 11/06/14  Yes Alycia Rossetti, MD  etodolac (LODINE) 400 MG tablet Take 400 mg by mouth every 12 (twelve) hours as needed for mild pain.   Yes Historical Provider, MD  glucose blood (ACCU-CHEK SMARTVIEW) test strip Use to monitor FSBS 1 time daily. DX: 250.00 08/15/13  Yes Alycia Rossetti, MD  ipratropium (ATROVENT) 0.02 % nebulizer solution USE 1 VIAL IN NEBULIZER 3 TIMES DAILY AS NEEDED FOR WHEEZING/SHORTNESS OF BREATH. 10/01/15  Yes Alycia Rossetti, MD  isosorbide mononitrate (IMDUR) 60 MG 24 hr tablet Take 1 tablet (60 mg total) by mouth daily. 04/30/15 11/25/17 Yes Alycia Rossetti, MD  Lancets Misc. (ACCU-CHEK FASTCLIX LANCET) KIT Use to monitor FSBS 1 time daily. DX: 250.00 08/15/13  Yes Alycia Rossetti, MD  levothyroxine (SYNTHROID, LEVOTHROID) 25 MCG tablet TAKE (1)  TABLET BY MOUTH ONCE DAILY BEFORE BREAKFAST. 06/26/15  Yes Alycia Rossetti, MD  lidocaine-prilocaine (EMLA) cream Apply topically as needed. Apply to L upper arm (1) hour prior to dialysis on MWF. Wrap with plastic wrap after applying. 02/28/15  Yes Alycia Rossetti, MD  mirtazapine (REMERON) 15 MG tablet TAKE 1/2 TABLET (7.5MG) BY MOUTH AT BEDTIME FOR MOOD/APPETITE. 09/25/15  Yes Alycia Rossetti, MD  Multiple Vitamin (DAILY-VITE) TABS TAKE 1 TABLET BY MOUTH ONCE DAILY. (ON DAY OF DIALYSIS, TAKE AFTER DIALYSIS) 09/25/15  Yes Alycia Rossetti, MD  nitroGLYCERIN (NITROSTAT) 0.4 MG SL tablet DISSOLVE 1 TABLET UNDER THE TONGUE EVERY 5 MINUTES X2 DOSES THEN TO ER 06/06/15  Yes Alycia Rossetti, MD  OXYGEN Inhale 2 L into the lungs daily as needed (shortness of breath).   Yes Historical Provider, MD  Polyethyl Glycol-Propyl Glycol (SYSTANE OP) Apply  1 drop to eye 4 (four) times daily as needed (dry eyes).    Yes Historical Provider, MD  polyethylene glycol powder (MIRALAX) powder Take 17 g by mouth daily. Hold for diarrhea 02/28/15  Yes Alycia Rossetti, MD  predniSONE (DELTASONE) 20 MG tablet Take 20 mg by mouth daily.   Yes Historical Provider, MD  Q-NOL 325 MG tablet TAKE (2) TABLETS FOUR TIMES DAILY AS NEEDED FOR PAIN. -MAY MAKE DROWSY - 09/04/15  Yes Alycia Rossetti, MD  ranitidine (ZANTAC) 150 MG tablet TAKE 1 TABLET BY MOUTH AT BEDTIME. 09/25/15  Yes Alycia Rossetti, MD  SENSIPAR 30 MG tablet TAKE 1 TABLET BY MOUTH ONCE DAILY AFTER THE EVENING MEAL 08/19/15  Yes Alycia Rossetti, MD  sevelamer (RENVELA) 800 MG tablet Take 800-2,400 mg by mouth See admin instructions. 3 tabs with each meal and 1tab twice daily with snacks   Yes Historical Provider, MD  traMADol (ULTRAM) 50 MG tablet Take 1 tablet (50 mg total) by mouth 2 (two) times daily as needed. Patient taking differently: Take 50 mg by mouth every 12 (twelve) hours as needed for moderate pain.  03/12/15  Yes Alycia Rossetti, MD  Travoprost, BAK Free,  (TRAVATAN) 0.004 % SOLN ophthalmic solution Place 1 drop into both eyes at bedtime.   Yes Historical Provider, MD    Family History History reviewed. No pertinent family history.  Social History Social History  Substance Use Topics  . Smoking status: Never Smoker  . Smokeless tobacco: Never Used  . Alcohol use No     Allergies   Review of patient's allergies indicates no known allergies.   Review of Systems Review of Systems  Constitutional: Negative for fever.  HENT: Negative for ear pain.   Respiratory: Positive for shortness of breath.   Cardiovascular: Negative for orthopnea.  All other systems reviewed and are negative.    Physical Exam Updated Vital Signs BP (!) 169/63 (BP Location: Right Arm)   Pulse 60   Temp 97.8 F (36.6 C) (Oral)   Resp 20   Ht '5\' 6"'  (1.676 m)   Wt 187 lb 14.4 oz (85.2 kg)   LMP 12/24/2010   SpO2 100%   BMI 30.33 kg/m   Physical Exam  Constitutional: She is oriented to person, place, and time. She appears well-developed and well-nourished. No distress.  HENT:  Head: Normocephalic and atraumatic.  Eyes: Conjunctivae are normal.  Neck: Neck supple.  Cardiovascular: Normal rate and regular rhythm.  Exam reveals no gallop.   No murmur heard. Pulmonary/Chest: Effort normal. No respiratory distress. She has rales.  Abdominal: Soft. There is no tenderness.  Musculoskeletal: She exhibits edema.  Neurological: She is alert and oriented to person, place, and time. No cranial nerve deficit. Coordination normal.  Skin: Skin is warm and dry.  Psychiatric: She has a normal mood and affect.  Nursing note and vitals reviewed.    ED Treatments / Results  Labs (all labs ordered are listed, but only abnormal results are displayed) Labs Reviewed  CBC WITH DIFFERENTIAL/PLATELET - Abnormal; Notable for the following:       Result Value   Hemoglobin 10.5 (*)    HCT 32.7 (*)    MCH 25.4 (*)    Neutro Abs 8.1 (*)    All other components within  normal limits  BASIC METABOLIC PANEL - Abnormal; Notable for the following:    Sodium 134 (*)    Chloride 93 (*)    Glucose, Bld 223 (*)  BUN 72 (*)    Creatinine, Ser 7.50 (*)    GFR calc non Af Amer 4 (*)    GFR calc Af Amer 5 (*)    All other components within normal limits  BRAIN NATRIURETIC PEPTIDE - Abnormal; Notable for the following:    B Natriuretic Peptide 1,135.0 (*)    All other components within normal limits  TROPONIN I - Abnormal; Notable for the following:    Troponin I 0.04 (*)    All other components within normal limits  D-DIMER, QUANTITATIVE (NOT AT [facility]) - Abnormal; Notable for the following:    D-Dimer, Quant 0.97 (*)    All other components within normal limits  GLUCOSE, CAPILLARY - Abnormal; Notable for the following:    Glucose-Capillary 256 (*)    All other components within normal limits  MRSA PCR SCREENING  BASIC METABOLIC PANEL  TROPONIN I  TROPONIN I  HEMOGLOBIN A1C    EKG  EKG Interpretation  Date/Time:  Thursday October 02 2015 14:53:17 EDT Ventricular Rate:  60 PR Interval:    QRS Duration: 102 QT Interval:  447 QTC Calculation: 447 R Axis:   16 Text Interpretation:  Sinus rhythm Multiple ventricular premature complexes Prolonged PR interval Abnormal R-wave progression, early transition LVH with secondary repolarization abnormality PVC's more persistent, no other obvious changes relative to previously Confirmed by [facility] MD, Lucy Boardman [phone]) on 10/02/2015 5:01:06 PM       Radiology Dg Chest Portable 1 View  Result Date: 10/02/2015 CLINICAL DATA:  Shortness of breath for 4 days EXAM: PORTABLE CHEST 1 VIEW COMPARISON:  09/30/2015 FINDINGS: Cardiomegaly again noted. Chronic elevation of the left hemidiaphragm. Atherosclerotic calcifications of thoracic aorta. Bilateral shoulder degenerative changes. Mild interstitial prominence bilateral without convincing pulmonary edema. No segmental infiltrate. Left basilar atelectasis. IMPRESSION:  Chronic elevation of the left hemidiaphragm. Atherosclerotic calcifications of thoracic aorta. Bilateral shoulder degenerative changes. Mild interstitial prominence bilateral without convincing pulmonary edema. No segmental infiltrate. Left basilar atelectasis. Electronically Signed   By: Lahoma Crocker M.D.   On: 10/02/2015 15:09    Procedures Procedures (including critical care time)  Medications Ordered in ED Medications  aspirin EC tablet 81 mg (not administered)  mirtazapine (REMERON) tablet 7.5 mg (not administered)  famotidine (PEPCID) tablet 20 mg (not administered)  cinacalcet (SENSIPAR) tablet 30 mg (not administered)  levothyroxine (SYNTHROID, LEVOTHROID) tablet 25 mcg (not administered)  latanoprost (XALATAN) 0.005 % ophthalmic solution 1 drop (not administered)  amLODipine (NORVASC) tablet 10 mg (not administered)  citalopram (CELEXA) tablet 20 mg (not administered)  isosorbide mononitrate (IMDUR) 24 hr tablet 60 mg (not administered)  polyethylene glycol powder (GLYCOLAX/MIRALAX) container 17 g (not administered)  docusate sodium (COLACE) capsule 100 mg (not administered)  sevelamer carbonate (RENVELA) tablet 2,400 mg (not administered)  sodium chloride flush (NS) 0.9 % injection 3 mL (not administered)  sodium chloride flush (NS) 0.9 % injection 3 mL (not administered)  0.9 %  sodium chloride infusion (not administered)  acetaminophen (TYLENOL) tablet 650 mg (not administered)  ondansetron (ZOFRAN) injection 4 mg (not administered)  heparin injection 5,000 Units (not administered)  insulin aspart (novoLOG) injection 0-9 Units (not administered)  insulin aspart (novoLOG) injection 0-5 Units (not administered)  ipratropium-albuterol (DUONEB) 0.5-2.5 (3) MG/3ML nebulizer solution 3 mL (not administered)  polyvinyl alcohol (LIQUIFILM TEARS) 1.4 % ophthalmic solution 1 drop (not administered)  sevelamer carbonate (RENVELA) tablet 800 mg (not administered)  methylPREDNISolone  sodium succinate (SOLU-MEDROL) 125 mg/2 mL injection 125 mg (125 mg Intravenous Given 10/02/15 1532)  ipratropium-albuterol (DUONEB) 0.5-2.5 (3) MG/3ML nebulizer solution 3 mL (3 mLs Nebulization Given 10/02/15 1522)  furosemide (LASIX) injection 80 mg (80 mg Intravenous Given 10/02/15 1718)     Initial Impression / Assessment and Plan / ED Course  I have reviewed the triage vital signs and the nursing notes.  Pertinent labs & imaging results that were available during my care of the patient were reviewed by me and considered in my medical decision making (see chart for details).  Clinical Course    82 yo ESRD on dialysis here with dyspnea and likely fluid overload on labs and CXR. No significant CP to be too worried about ACS. Makes some urine so a bolus of lasix given. Most likely needs dialysis.   Final Clinical Impressions(s) / ED Diagnoses   Final diagnoses:  Hypervolemia, unspecified hypervolemia type  SOB (shortness of breath)    New Prescriptions Current Discharge Medication List       Merrily Pew, MD 10/02/15 2127

## 2015-10-02 NOTE — ED Notes (Signed)
Attempted to call report to [phone]

## 2015-10-02 NOTE — ED Triage Notes (Signed)
PT resident of [facility].  Reports had dialysis yesterday. Pt c/o sob x 4 days.   02 sat on 4 liters 99%, lungs clear per ems.  Pt denies any cough, pain, fever, or swelling.

## 2015-10-02 NOTE — H&P (Signed)
History and Physical    [name] ZHY:[id number] DOB: 05-03-1925 DOA: 10/02/2015  PCP: Vic Blackbird, MD   Patient coming from: [place]   Chief Complaint: Respiratory distress   HPI: [name] is a 82 y.o. female with medical history significant for end-stage renal disease on hemodialysis, chronic diastolic CHF, depression, chronic respiratory failure with hypoxia on supplemental oxygen at 2 L/m, and hypothyroidism who presents the emergency department from her nursing home for evaluation of dyspnea. Patient reports that she had been in her usual state of health until the insidious onset of dyspnea approximately 4 days ago. Symptoms have progressively worsened over that interval and she was reportedly evaluated in an emergency department 2 days ago, diagnosed with COPD exacerbation, and discharge back to the nursing facility with prednisone and breathing treatments. Despite those measures, the patient's symptoms continued to worsen and she presents for evaluation of this. Patient underwent today a dialysis session today prior to her admission and completed dissection without incident. There has been no fevers or chills and no chest pain or palpitations. There is some chronic orthopnea, possibly worsened slightly. She has a cough which is nonproductive and not associated with pain. There has been no recent weight gain, dietary indiscretion, or change in medications aside from the recent addition of prednisone.  ED Course: Upon arrival to the ED, patient is found to be afebrile, saturating well on 2 L/m nasal cannula, bradycardic in the high 50s, and with stable blood pressure. EKG demonstrates sinus rhythm with VPCs, first-degree AV nodal block, and LVH with repolarization abnormality. Chest x-ray features mild interstitial prominence without convincing pulmonary edema. Chemistry panel is notable for a mild hyponatremia and hypochloremia, BUN 72, creatinine 7.50, and serum  glucose 223. CBC is notable for a stable normocytic anemia with hemoglobin of 10.5. BNP is elevated to a value of 1135. Patient was treated with 80 mg IV Lasix, DuoNeb, and 125 mg IV Solu-Medrol in the emergency department. She continues to saturate well on 2 L/m via nasal cannula and has remained hemodynamically stable. She'll be observed on the telemetry unit for ongoing evaluation and management of worsening respiratory distress.   Review of Systems:  All other systems reviewed and apart from HPI, are negative.  Past Medical History:  Diagnosis Date  . Anemia   . Anemia in CKD (chronic kidney disease)   . Anxiety   . Arthritis    gout, right shoulder  . Cancer [facility])    ?stomach cancer   . Cataracts, both eyes   . CHF (congestive heart failure) ([place])   . CKD (chronic kidney disease), stage V ([place])    on HD on M/W/F  . Depression   . Diabetes mellitus, type 2 ([place])   . GERD (gastroesophageal reflux disease)   . Gynecologic cancer   . Hyperlipidemia   . Hypertension   . IBS (irritable bowel syndrome)    with costipation  . Intraoperative hemorrhage and hematoma of a digestive system organ or structure complicating a digestive system procedure   . Overactive bladder   . Pneumonia    2 times in 2016  . SOB (shortness of breath)    nml spirometry 09/05/06  . Stroke [facility])    residual right side weakness and pain  . SVT (supraventricular tachycardia) ([place])   . Syncope and collapse   . Thyroid disease     Past Surgical History:  Procedure Laterality Date  . [place], BRACHIOCEPHALIC  29/52/8413  left arm   by Dr. Bridgett Larsson  . COLONOSCOPY    . FISTULA SUPERFICIALIZATION Left 06/10/2015   Procedure: LEFT UPPER ARM BRACHIOCEPHALIC FISTULA PSEUDOANEURYSM PLICATION;  Surgeon: Conrad Diablock, MD;  Location: [facility];  Service: Vascular;  Laterality: Left;  . PERIPHERAL VASCULAR CATHETERIZATION Left 05/15/2015   Procedure: Fistulagram;  Surgeon: Conrad El Campo, MD;  Location: [facility];  Service: Cardiovascular;  Laterality: Left;  . TEE WITHOUT CARDIOVERSION  2011   2/2 Strep Bovis infection  . VESICOVAGINAL FISTULA CLOSURE W/ TAH       reports that she has never smoked. She has never used smokeless tobacco. She reports that she does not drink alcohol or use drugs.  No Known Allergies  History reviewed. No pertinent family history.   Prior to Admission medications   Medication Sig Start Date End Date Taking? Authorizing Provider  albuterol (PROVENTIL HFA;VENTOLIN HFA) 108 (90 Base) MCG/ACT inhaler Inhale 2 puffs into the lungs every 4 (four) hours as needed for wheezing or shortness of breath.   Yes Historical Provider, MD  albuterol (PROVENTIL) (2.5 MG/3ML) 0.083% nebulizer solution USE 1 VIAL IN NEBULIZER 3 TIMES DAILY AS NEEDED FOR WHEEZING/SHORTNESS OF BREATH. 10/01/15  Yes Alycia Rossetti, MD  amLODipine (NORVASC) 10 MG tablet Take 1 tablet (10 mg total) by mouth every morning. 04/30/15 11/25/17 Yes Alycia Rossetti, MD  ASPIRIN LOW DOSE 81 MG EC tablet TAKE (1) TABLET BY MOUTH ONCE DAILY. 09/25/15  Yes Alycia Rossetti, MD  bisacodyl (DULCOLAX) 10 MG suppository 1 suppository rectally  Daily every 3 days as needed for constipation Patient taking differently: Place 10 mg rectally daily as needed for moderate constipation.  03/21/12  Yes Alycia Rossetti, MD  Blood Glucose Calibration (ACCU-CHEK SMARTVIEW CONTROL) LIQD Use to monitor FSBS 1 time daily. DX: 250.00 08/15/13  Yes Alycia Rossetti, MD  Blood Glucose Monitoring Suppl (ACCU-CHEK NANO SMARTVIEW) W/DEVICE KIT Use to monitor FSBS 1 time daily. DX: 250.00 08/15/13  Yes Alycia Rossetti, MD  citalopram (CELEXA) 20 MG tablet Take 1 tablet (20 mg total) by mouth every morning. 04/30/15  Yes Alycia Rossetti, MD  docusate sodium (COLACE) 100 MG capsule Take 1 capsule (100 mg total) by mouth 2 (two) times daily. 11/06/14  Yes Alycia Rossetti, MD  etodolac (LODINE) 400 MG tablet Take 400 mg by mouth every 12  (twelve) hours as needed for mild pain.   Yes Historical Provider, MD  glucose blood (ACCU-CHEK SMARTVIEW) test strip Use to monitor FSBS 1 time daily. DX: 250.00 08/15/13  Yes Alycia Rossetti, MD  ipratropium (ATROVENT) 0.02 % nebulizer solution USE 1 VIAL IN NEBULIZER 3 TIMES DAILY AS NEEDED FOR WHEEZING/SHORTNESS OF BREATH. 10/01/15  Yes Alycia Rossetti, MD  isosorbide mononitrate (IMDUR) 60 MG 24 hr tablet Take 1 tablet (60 mg total) by mouth daily. 04/30/15 11/25/17 Yes Alycia Rossetti, MD  Lancets Misc. (ACCU-CHEK FASTCLIX LANCET) KIT Use to monitor FSBS 1 time daily. DX: 250.00 08/15/13  Yes Alycia Rossetti, MD  levothyroxine (SYNTHROID, LEVOTHROID) 25 MCG tablet TAKE (1) TABLET BY MOUTH ONCE DAILY BEFORE BREAKFAST. 06/26/15  Yes Alycia Rossetti, MD  lidocaine-prilocaine (EMLA) cream Apply topically as needed. Apply to L upper arm (1) hour prior to dialysis on MWF. Wrap with plastic wrap after applying. 02/28/15  Yes Alycia Rossetti, MD  mirtazapine (REMERON) 15 MG tablet TAKE 1/2 TABLET (7.5MG) BY MOUTH AT BEDTIME FOR MOOD/APPETITE. 09/25/15  Yes Modena Nunnery  , MD  Multiple Vitamin (DAILY-VITE) TABS TAKE 1 TABLET BY MOUTH ONCE DAILY. (ON DAY OF DIALYSIS, TAKE AFTER DIALYSIS) 09/25/15  Yes Alycia Rossetti, MD  nitroGLYCERIN (NITROSTAT) 0.4 MG SL tablet DISSOLVE 1 TABLET UNDER THE TONGUE EVERY 5 MINUTES X2 DOSES THEN TO ER 06/06/15  Yes Alycia Rossetti, MD  OXYGEN Inhale 2 L into the lungs daily as needed (shortness of breath).   Yes Historical Provider, MD  Polyethyl Glycol-Propyl Glycol (SYSTANE OP) Apply 1 drop to eye 4 (four) times daily as needed (dry eyes).    Yes Historical Provider, MD  polyethylene glycol powder (MIRALAX) powder Take 17 g by mouth daily. Hold for diarrhea 02/28/15  Yes Alycia Rossetti, MD  predniSONE (DELTASONE) 20 MG tablet Take 20 mg by mouth daily.   Yes Historical Provider, MD  Q-NOL 325 MG tablet TAKE (2) TABLETS FOUR TIMES DAILY AS NEEDED FOR PAIN. -MAY MAKE DROWSY  - 09/04/15  Yes Alycia Rossetti, MD  ranitidine (ZANTAC) 150 MG tablet TAKE 1 TABLET BY MOUTH AT BEDTIME. 09/25/15  Yes Alycia Rossetti, MD  SENSIPAR 30 MG tablet TAKE 1 TABLET BY MOUTH ONCE DAILY AFTER THE EVENING MEAL 08/19/15  Yes Alycia Rossetti, MD  sevelamer (RENVELA) 800 MG tablet Take 800-2,400 mg by mouth See admin instructions. 3 tabs with each meal and 1tab twice daily with snacks   Yes Historical Provider, MD  traMADol (ULTRAM) 50 MG tablet Take 1 tablet (50 mg total) by mouth 2 (two) times daily as needed. Patient taking differently: Take 50 mg by mouth every 12 (twelve) hours as needed for moderate pain.  03/12/15  Yes Alycia Rossetti, MD  Travoprost, BAK Free, (TRAVATAN) 0.004 % SOLN ophthalmic solution Place 1 drop into both eyes at bedtime.   Yes Historical Provider, MD    Physical Exam: Vitals:   10/02/15 1719 10/02/15 1730 10/02/15 1745 10/02/15 1800  BP: 158/55 154/67  169/60  Pulse: 60 (!) 58  60  Resp:  '12 13 13  ' Temp:      TempSrc:      SpO2: 100% 98%  99%  Weight:      Height:          Constitutional: NAD, calm, comfortable Eyes: PERTLA, lids and conjunctivae normal ENMT: Mucous membranes are moist. Posterior pharynx clear of any exudate or lesions.   Neck: normal, supple, no masses, no thyromegaly Respiratory: clear to auscultation bilaterally, no wheezing, no crackles. Normal respiratory effort.    Cardiovascular: S1 & S2 heard, regular rate and rhythm. 2+ pretibial edema b/l. No carotid bruits.  Abdomen: No distension, no tenderness, no masses palpated. Bowel sounds normal.  Musculoskeletal: no clubbing / cyanosis. No joint deformity upper and lower extremities. Normal muscle tone.  Skin: no significant rashes, lesions, ulcers. Warm, dry, well-perfused. Neurologic: CN 2-12 grossly intact. Sensation intact, DTR normal. Strength 5/5 in all 4 limbs.  Psychiatric: Normal judgment and insight. Alert and oriented x 3. Normal mood and affect.     Labs on  Admission: I have personally reviewed following labs and imaging studies  CBC:  Recent Labs Lab 10/02/15 1507  WBC 9.3  NEUTROABS 8.1*  HGB 10.5*  HCT 32.7*  MCV 79.0  PLT 415   Basic Metabolic Panel:  Recent Labs Lab 10/02/15 1507  NA 134*  K 4.4  CL 93*  CO2 29  GLUCOSE 223*  BUN 72*  CREATININE 7.50*  CALCIUM 9.2   GFR: Estimated Creatinine Clearance: 5.6 mL/min (by  C-G formula based on SCr of 7.5 mg/dL (H)). Liver Function Tests: No results for input(s): AST, ALT, ALKPHOS, BILITOT, PROT, ALBUMIN in the last 168 hours. No results for input(s): LIPASE, AMYLASE in the last 168 hours. No results for input(s): AMMONIA in the last 168 hours. Coagulation Profile: No results for input(s): INR, PROTIME in the last 168 hours. Cardiac Enzymes: No results for input(s): CKTOTAL, CKMB, CKMBINDEX, TROPONINI in the last 168 hours. BNP (last 3 results) No results for input(s): PROBNP in the last 8760 hours. HbA1C: No results for input(s): HGBA1C in the last 72 hours. CBG: No results for input(s): GLUCAP in the last 168 hours. Lipid Profile: No results for input(s): CHOL, HDL, LDLCALC, TRIG, CHOLHDL, LDLDIRECT in the last 72 hours. Thyroid Function Tests: No results for input(s): TSH, T4TOTAL, FREET4, T3FREE, THYROIDAB in the last 72 hours. Anemia Panel: No results for input(s): VITAMINB12, FOLATE, FERRITIN, TIBC, IRON, RETICCTPCT in the last 72 hours. Urine analysis:    Component Value Date/Time   COLORURINE YELLOW 01/31/2011 2039   APPEARANCEUR CLEAR 01/31/2011 2039   LABSPEC 1.010 01/31/2011 2039   PHURINE 6.5 01/31/2011 2039   GLUCOSEU NEGATIVE 01/31/2011 2039   HGBUR NEGATIVE 01/31/2011 2039   HGBUR trace-lysed 09/05/2006 0000   BILIRUBINUR NEGATIVE 01/31/2011 2039   [place] NEGATIVE 01/31/2011 2039   PROTEINUR 30 (A) 01/31/2011 2039   UROBILINOGEN 0.2 01/31/2011 2039   NITRITE NEGATIVE 01/31/2011 2039   LEUKOCYTESUR NEGATIVE 01/31/2011 2039   Sepsis  Labs: '@LABRCNTIP' (procalcitonin:4,lacticidven:4) )No results found for this or any previous visit (from the past 240 hour(s)).   Radiological Exams on Admission: Dg Chest Portable 1 View  Result Date: 10/02/2015 CLINICAL DATA:  Shortness of breath for 4 days EXAM: PORTABLE CHEST 1 VIEW COMPARISON:  09/30/2015 FINDINGS: Cardiomegaly again noted. Chronic elevation of the left hemidiaphragm. Atherosclerotic calcifications of thoracic aorta. Bilateral shoulder degenerative changes. Mild interstitial prominence bilateral without convincing pulmonary edema. No segmental infiltrate. Left basilar atelectasis. IMPRESSION: Chronic elevation of the left hemidiaphragm. Atherosclerotic calcifications of thoracic aorta. Bilateral shoulder degenerative changes. Mild interstitial prominence bilateral without convincing pulmonary edema. No segmental infiltrate. Left basilar atelectasis. Electronically Signed   By: Lahoma Crocker M.D.   On: 10/02/2015 15:09    EKG: Independently reviewed. Sinus rhythm, VPC, first degree AV block, LVH with repolarization abnormality  Assessment/Plan  1. Respiratory distress  - Etiology not entirely clear; likely secondary to acute on chronic diastolic CHF, she had been diagnosed with COPD exacerbation on 9/19 and started on prednisone but with no improvement  - She is afebrile on presentation, without leukocytosis, and saturating well on her usual 2 Lpm  - CXR no infiltrates, possible mild CHF  - She was given Lasix 80 mg IV, DuoNeb, and 125 mg IV Solu-Medrol in ED and has made some subjective improvement  - There is no wheezing or obstructed breathing, and no history of bronchospasm, COPD, or smoking and will not continue the systemic steroid for now - She has not urinated despite the Lasix IVP - Continue DuoNeb prn; monitor with continuous pulse oximetry and titrate FiO2 to maintain sat >90% - D-dimer sent given the diagnostic uncertainty risk factors for VTE; if >0.50, will order  V/Q scan and treat if postive   2. Acute on chronic diastolic CHF  - BNP is elevated to 1135 on admission and there is BLE edema  - TTE (05/27/15) with EF 75-64%, grade 1 diastolic dysfunction, mod LVH, and mod LAE  - Given a dose of Lasix 80  mg IV in ED without much of a response  - Reportedly completed her HD session 10/01/15 without incident  - Denies indiscretion with salt or fluids  - SLIV, fluid-restrict diet, follow daily wts and strict I/O's - Nephrology consulted for HD   3. ESRD  - Reports completing HD on 9/20 without incident  - Potassium wnl on admission, no uremia; she appears volume-up on admission, but not grossly so - Nephrology consulted for HD   4. Hypothyroidism  - Appears to be stable, will continue current-dose Synthroid    5. Depression  - Stable, not currently depressed  - Continue Celexa and Remeron   6. Hyperglycemia  - Serum glucose 223 on admission; likely secondary to prednisone, started on 9/19 for possible COPD exacerbation  - A1c was 5.5% in April 2017  - Steroids stopped  - Check CBG with meals and qHS; low-intensity sliding-scale correctional insulin prn     DVT prophylaxis: sq heparin  Code Status: Full  Family Communication: Discussed with patient Disposition Plan: Observe on telemetry Consults called: None Admission status: Observation    Vianne Bulls, MD [facility] Pager [phone]  If 7PM-7AM, please contact night-coverage [email or web address] Password [facility]  10/02/2015, 7:59 PM

## 2015-10-02 NOTE — Progress Notes (Signed)
CRITICAL VALUE ALERT  Critical value received:  Troponin 0.04  Date of notification:  10/02/2015  Time of notification:  20:40  Critical value read back: yes  Nurse who received alert:  Stephannie Peters  MD notified (1st page):  NP Schorr  Time of first page:  20:50  MD notified (2nd page):  Time of second page:  Responding MD: Responded   Time MD responded: New orders entered

## 2015-10-02 NOTE — ED Notes (Signed)
Attempted to call report to [phone].

## 2015-10-03 ENCOUNTER — Observation Stay (HOSPITAL_COMMUNITY): Payer: Commercial Managed Care - HMO

## 2015-10-03 ENCOUNTER — Encounter (HOSPITAL_COMMUNITY): Payer: Self-pay

## 2015-10-03 DIAGNOSIS — Z79899 Other long term (current) drug therapy: Secondary | ICD-10-CM | POA: Diagnosis not present

## 2015-10-03 DIAGNOSIS — I5033 Acute on chronic diastolic (congestive) heart failure: Secondary | ICD-10-CM | POA: Diagnosis not present

## 2015-10-03 DIAGNOSIS — E1129 Type 2 diabetes mellitus with other diabetic kidney complication: Secondary | ICD-10-CM | POA: Diagnosis not present

## 2015-10-03 DIAGNOSIS — I132 Hypertensive heart and chronic kidney disease with heart failure and with stage 5 chronic kidney disease, or end stage renal disease: Secondary | ICD-10-CM | POA: Diagnosis not present

## 2015-10-03 DIAGNOSIS — Z992 Dependence on renal dialysis: Secondary | ICD-10-CM | POA: Diagnosis not present

## 2015-10-03 DIAGNOSIS — Z8673 Personal history of transient ischemic attack (TIA), and cerebral infarction without residual deficits: Secondary | ICD-10-CM | POA: Diagnosis not present

## 2015-10-03 DIAGNOSIS — F329 Major depressive disorder, single episode, unspecified: Secondary | ICD-10-CM | POA: Diagnosis not present

## 2015-10-03 DIAGNOSIS — R0602 Shortness of breath: Secondary | ICD-10-CM | POA: Diagnosis not present

## 2015-10-03 DIAGNOSIS — Z7982 Long term (current) use of aspirin: Secondary | ICD-10-CM | POA: Diagnosis not present

## 2015-10-03 DIAGNOSIS — E119 Type 2 diabetes mellitus without complications: Secondary | ICD-10-CM | POA: Diagnosis not present

## 2015-10-03 DIAGNOSIS — N186 End stage renal disease: Secondary | ICD-10-CM | POA: Diagnosis not present

## 2015-10-03 DIAGNOSIS — I509 Heart failure, unspecified: Secondary | ICD-10-CM | POA: Diagnosis not present

## 2015-10-03 DIAGNOSIS — I1311 Hypertensive heart and chronic kidney disease without heart failure, with stage 5 chronic kidney disease, or end stage renal disease: Secondary | ICD-10-CM | POA: Diagnosis not present

## 2015-10-03 DIAGNOSIS — I10 Essential (primary) hypertension: Secondary | ICD-10-CM | POA: Diagnosis not present

## 2015-10-03 DIAGNOSIS — R791 Abnormal coagulation profile: Secondary | ICD-10-CM | POA: Diagnosis not present

## 2015-10-03 LAB — GLUCOSE, CAPILLARY
GLUCOSE-CAPILLARY: 165 mg/dL — AB (ref 65–99)
GLUCOSE-CAPILLARY: 136 mg/dL — AB (ref 65–99)
GLUCOSE-CAPILLARY: 181 mg/dL — AB (ref 65–99)
Glucose-Capillary: 116 mg/dL — ABNORMAL HIGH (ref 65–99)

## 2015-10-03 LAB — BASIC METABOLIC PANEL
Anion gap: 3 — ABNORMAL LOW (ref 5–15)
BUN: 86 mg/dL — ABNORMAL HIGH (ref 6–20)
CALCIUM: 8.2 mg/dL — AB (ref 8.9–10.3)
CHLORIDE: 100 mmol/L — AB (ref 101–111)
CO2: 27 mmol/L (ref 22–32)
CREATININE: 8.35 mg/dL — AB (ref 0.44–1.00)
GFR, EST AFRICAN AMERICAN: 4 mL/min — AB (ref >60)
GFR, EST NON AFRICAN AMERICAN: 4 mL/min — AB (ref >60)
Glucose, Bld: 282 mg/dL — ABNORMAL HIGH (ref 65–99)
Potassium: 4.7 mmol/L (ref 3.5–5.1)
SODIUM: 130 mmol/L — AB (ref 135–145)

## 2015-10-03 LAB — TROPONIN I
TROPONIN I: 0.04 ng/mL — AB (ref <0.03)
Troponin I: 0.03 ng/mL (ref <0.03)

## 2015-10-03 MED ORDER — TECHNETIUM TC 99M DIETHYLENETRIAME-PENTAACETIC ACID
30.0000 | Freq: Once | INTRAVENOUS | Status: AC | PRN
  Administered 2015-10-03: 30 via INTRAVENOUS

## 2015-10-03 MED ORDER — TECHNETIUM TO 99M ALBUMIN AGGREGATED
4.0000 | Freq: Once | INTRAVENOUS | Status: AC | PRN
  Administered 2015-10-03: 4 via INTRAVENOUS

## 2015-10-03 MED ORDER — HEPARIN SODIUM (PORCINE) 1000 UNIT/ML DIALYSIS
20.0000 [IU]/kg | INTRAMUSCULAR | Status: DC | PRN
  Administered 2015-10-03: 1700 [IU] via INTRAVENOUS_CENTRAL
  Filled 2015-10-03 (×2): qty 2

## 2015-10-03 MED ORDER — HEPARIN SODIUM (PORCINE) 1000 UNIT/ML IJ SOLN
INTRAMUSCULAR | Status: AC
  Administered 2015-10-03: 1700 [IU] via INTRAVENOUS_CENTRAL
  Filled 2015-10-03: qty 4

## 2015-10-03 MED ORDER — POLYETHYLENE GLYCOL 3350 17 G PO PACK
17.0000 g | PACK | Freq: Every day | ORAL | Status: DC
  Administered 2015-10-03 – 2015-10-04 (×2): 17 g via ORAL
  Filled 2015-10-03 (×2): qty 1

## 2015-10-03 MED ORDER — LIDOCAINE HCL (PF) 1 % IJ SOLN: 5.0000 mL | INTRAMUSCULAR | Status: DC | PRN

## 2015-10-03 MED ORDER — LIDOCAINE-PRILOCAINE 2.5-2.5 % EX CREA: 1.0000 "application " | TOPICAL_CREAM | CUTANEOUS | Status: DC | PRN

## 2015-10-03 MED ORDER — SODIUM CHLORIDE 0.9 % IV SOLN: 100.0000 mL | INTRAVENOUS | Status: DC | PRN

## 2015-10-03 MED ORDER — PENTAFLUOROPROP-TETRAFLUOROETH EX AERO: 1.0000 "application " | INHALATION_SPRAY | CUTANEOUS | Status: DC | PRN

## 2015-10-03 NOTE — Consult Note (Signed)
Reason for Consult: End-stage renal disease Referring Physician: Jerilee Hoh.  [name] is an 82 y.o. female.  HPI: She is a patient who has history of COPD, hypertension, diabetes, end-stage renal disease on maintenance hemodialysis presently came with complaints of dry cough and difficulty breathing for a couple of days. According to the patient she started having cough, nonproductive associated with sore throat and some exertional dyspnea. Patient was seen in emergency room and was thought to be secondary to exacerbation of COPD. She was treated with steroid and also inhaler without significant improvement. Presently still she has difficulty breathing even after dialysis. She considered it has pressure like but not sharp. At this moment she denies any fevers chills or sweating.  Past Medical History:  Diagnosis Date  . Anemia   . Anemia in CKD (chronic kidney disease)   . Anxiety   . Arthritis    gout, right shoulder  . Cancer [facility])    ?stomach cancer   . Cataracts, both eyes   . CHF (congestive heart failure) ([place])   . CKD (chronic kidney disease), stage V ([place])    on HD on M/W/F  . Depression   . Diabetes mellitus, type 2 ([place])   . GERD (gastroesophageal reflux disease)   . Gynecologic cancer   . Hyperlipidemia   . Hypertension   . IBS (irritable bowel syndrome)    with costipation  . Intraoperative hemorrhage and hematoma of a digestive system organ or structure complicating a digestive system procedure   . Overactive bladder   . Pneumonia    2 times in 2016  . SOB (shortness of breath)    nml spirometry 09/05/06  . Stroke [facility])    residual right side weakness and pain  . SVT (supraventricular tachycardia) ([place])   . Syncope and collapse   . Thyroid disease     Past Surgical History:  Procedure Laterality Date  . AV FISTULA PLACEMENT, BRACHIOCEPHALIC  38/10/1749   left arm   by Dr. Bridgett Larsson  . COLONOSCOPY    . FISTULA SUPERFICIALIZATION Left 06/10/2015   Procedure:  LEFT UPPER ARM BRACHIOCEPHALIC FISTULA PSEUDOANEURYSM PLICATION;  Surgeon: Conrad Emerado, MD;  Location: [facility];  Service: Vascular;  Laterality: Left;  . PERIPHERAL VASCULAR CATHETERIZATION Left 05/15/2015   Procedure: Fistulagram;  Surgeon: Conrad , MD;  Location: [facility];  Service: Cardiovascular;  Laterality: Left;  . TEE WITHOUT CARDIOVERSION  2011   2/2 Strep Bovis infection  . VESICOVAGINAL FISTULA CLOSURE W/ TAH      History reviewed. No pertinent family history.  Social History:  reports that she has never smoked. She has never used smokeless tobacco. She reports that she does not drink alcohol or use drugs.  Allergies: No Known Allergies  Medications: I have reviewed the patient's current medications.  Results for orders placed or performed during the hospital encounter of 10/02/15 (from the past 48 hour(s))  CBC with Differential     Status: Abnormal   Collection Time: 10/02/15  3:07 PM  Result Value Ref Range   WBC 9.3 4.0 - 10.5 K/uL   RBC 4.14 3.87 - 5.11 MIL/uL   Hemoglobin 10.5 (L) 12.0 - 15.0 g/dL   HCT 32.7 (L) 36.0 - 46.0 %   MCV 79.0 78.0 - 100.0 fL   MCH 25.4 (L) 26.0 - 34.0 pg   MCHC 32.1 30.0 - 36.0 g/dL   RDW 14.1 11.5 - 15.5 %   Platelets 197 150 - 400 K/uL  Neutrophils Relative % 87 %   Neutro Abs 8.1 (H) 1.7 - 7.7 K/uL   Lymphocytes Relative 10 %   Lymphs Abs 1.0 0.7 - 4.0 K/uL   Monocytes Relative 3 %   Monocytes Absolute 0.2 0.1 - 1.0 K/uL   Eosinophils Relative 0 %   Eosinophils Absolute 0.0 0.0 - 0.7 K/uL   Basophils Relative 0 %   Basophils Absolute 0.0 0.0 - 0.1 K/uL  Basic metabolic panel     Status: Abnormal   Collection Time: 10/02/15  3:07 PM  Result Value Ref Range   Sodium 134 (L) 135 - 145 mmol/L   Potassium 4.4 3.5 - 5.1 mmol/L   Chloride 93 (L) 101 - 111 mmol/L   CO2 29 22 - 32 mmol/L   Glucose, Bld 223 (H) 65 - 99 mg/dL   BUN 72 (H) 6 - 20 mg/dL   Creatinine, Ser 7.50 (H) 0.44 - 1.00 mg/dL   Calcium 9.2 8.9 -  10.3 mg/dL   GFR calc non Af Amer 4 (L) >60 mL/min   GFR calc Af Amer 5 (L) >60 mL/min    Comment: (NOTE) The eGFR has been calculated using the CKD EPI equation. This calculation has not been validated in all clinical situations. eGFR's persistently <60 mL/min signify possible Chronic Kidney Disease.    Anion gap 12 5 - 15  Troponin I     Status: Abnormal   Collection Time: 10/02/15  3:07 PM  Result Value Ref Range   Troponin I 0.04 (HH) <0.03 ng/mL    Comment: CRITICAL RESULT CALLED TO, READ BACK BY AND VERIFIED WITH: HALMTON,S AT 2044 ON 10/02/2015 BY ISLEY,B   D-dimer, quantitative (not at [facility])     Status: Abnormal   Collection Time: 10/02/15  3:07 PM  Result Value Ref Range   D-Dimer, Quant 0.97 (H) 0.00 - 0.50 ug/mL-FEU    Comment: (NOTE) At the manufacturer cut-off of 0.50 ug/mL FEU, this assay has been documented to exclude PE with a sensitivity and negative predictive value of 97 to 99%.  At this time, this assay has not been approved by the FDA to exclude DVT/VTE. Results should be correlated with clinical presentation.   Brain natriuretic peptide     Status: Abnormal   Collection Time: 10/02/15  4:00 PM  Result Value Ref Range   B Natriuretic Peptide 1,135.0 (H) 0.0 - 100.0 pg/mL  MRSA PCR Screening     Status: None   Collection Time: 10/02/15  8:49 PM  Result Value Ref Range   MRSA by PCR NEGATIVE NEGATIVE    Comment:        The GeneXpert MRSA Assay (FDA approved for NASAL specimens only), is one component of a comprehensive MRSA colonization surveillance program. It is not intended to diagnose MRSA infection nor to guide or monitor treatment for MRSA infections.   Glucose, capillary     Status: Abnormal   Collection Time: 10/02/15  8:52 PM  Result Value Ref Range   Glucose-Capillary 256 (H) 65 - 99 mg/dL  Troponin I     Status: Abnormal   Collection Time: 10/03/15  2:09 AM  Result Value Ref Range   Troponin I 0.03 (HH) <0.03 ng/mL    Comment:  CRITICAL VALUE NOTED.  VALUE IS CONSISTENT WITH PREVIOUSLY REPORTED AND CALLED VALUE.  Basic metabolic panel     Status: Abnormal   Collection Time: 10/03/15  2:09 AM  Result Value Ref Range   Sodium 130 (L) 135 - 145  mmol/L   Potassium 4.7 3.5 - 5.1 mmol/L   Chloride 100 (L) 101 - 111 mmol/L   CO2 27 22 - 32 mmol/L   Glucose, Bld 282 (H) 65 - 99 mg/dL   BUN 86 (H) 6 - 20 mg/dL   Creatinine, Ser 8.35 (H) 0.44 - 1.00 mg/dL   Calcium 8.2 (L) 8.9 - 10.3 mg/dL   GFR calc non Af Amer 4 (L) >60 mL/min   GFR calc Af Amer 4 (L) >60 mL/min    Comment: (NOTE) The eGFR has been calculated using the CKD EPI equation. This calculation has not been validated in all clinical situations. eGFR's persistently <60 mL/min signify possible Chronic Kidney Disease.    Anion gap 3 (L) 5 - 15  Glucose, capillary     Status: Abnormal   Collection Time: 10/03/15  7:24 AM  Result Value Ref Range   Glucose-Capillary 165 (H) 65 - 99 mg/dL   Comment 1 Document in Chart   Troponin I     Status: Abnormal   Collection Time: 10/03/15  8:14 AM  Result Value Ref Range   Troponin I 0.04 (HH) <0.03 ng/mL    Comment: CRITICAL RESULT CALLED TO, READ BACK BY AND VERIFIED WITH: MAYS,J AT 9:20AM ON 10/03/15 BY FESTERMAN,C     Nm Pulmonary Perf And Vent  Result Date: 10/03/2015 CLINICAL DATA:  Shortness of breath, elevated D-dimer EXAM: NUCLEAR MEDICINE VENTILATION - PERFUSION LUNG SCAN TECHNIQUE: Ventilation images were obtained in multiple projections using inhaled aerosol Tc-40mDTPA. Perfusion images were obtained in multiple projections after intravenous injection of Tc-967mAA. RADIOPHARMACEUTICALS:  33.0 mCi Technetium-9947mPA aerosol inhalation and 4.3 mCi Technetium-56m71m IV COMPARISON:  Chest x-ray 10/02/2015 FINDINGS: Ventilation: No focal ventilation defect. Perfusion: No wedge shaped peripheral perfusion defects to suggest acute pulmonary embolism. IMPRESSION: No evidence of pulmonary embolus. Electronically  Signed   By: KeviRolm Baptise.   On: 10/03/2015 09:31   Dg Chest Portable 1 View  Result Date: 10/02/2015 CLINICAL DATA:  Shortness of breath for 4 days EXAM: PORTABLE CHEST 1 VIEW COMPARISON:  09/30/2015 FINDINGS: Cardiomegaly again noted. Chronic elevation of the left hemidiaphragm. Atherosclerotic calcifications of thoracic aorta. Bilateral shoulder degenerative changes. Mild interstitial prominence bilateral without convincing pulmonary edema. No segmental infiltrate. Left basilar atelectasis. IMPRESSION: Chronic elevation of the left hemidiaphragm. Atherosclerotic calcifications of thoracic aorta. Bilateral shoulder degenerative changes. Mild interstitial prominence bilateral without convincing pulmonary edema. No segmental infiltrate. Left basilar atelectasis. Electronically Signed   By: LiviLahoma Crocker.   On: 10/02/2015 15:09    Review of Systems  Constitutional: Positive for fever. Negative for chills.  HENT: Positive for congestion and sore throat.   Respiratory: Positive for cough and shortness of breath. Negative for sputum production.   Cardiovascular: Positive for orthopnea. Negative for chest pain and leg swelling.  Gastrointestinal: Negative for abdominal pain, nausea and vomiting.  Neurological: Positive for weakness.   Blood pressure (!) 188/68, pulse (!) 57, temperature 98.4 F (36.9 C), temperature source Oral, resp. rate 17, height '5\' 6"'  (1.676 m), weight 85.2 kg (187 lb 14.4 oz), last menstrual period 12/24/2010, SpO2 100 %. Physical Exam  Constitutional: She is oriented to person, place, and time. No distress.  Eyes: No scleral icterus.  Neck: No JVD present.  Cardiovascular: Normal rate and regular rhythm.   No murmur heard. Respiratory: No respiratory distress. She has wheezes. She has no rales.  GI: She exhibits no distension. There is no tenderness.  Musculoskeletal: She exhibits  no edema.  Neurological: She is alert and oriented to person, place, and time.     Assessment/Plan: Problem #1 difficulty in breathing: Etiology not clear. At this moment does not look like PE and chest x-ray did show an infiltrate or significant sign of fluid overload. Patient is still with difficulty breathing and she has dry cough. Problem #2 end-stage renal disease: She is status post hemodialysis on Wednesday. She doesn't have any nausea or vomiting. Her potassium is normal Problem #3 anemia: Her hemoglobin is within our target goal Problem #4 hypertension: Her blood pressure is somewhat high this morning. Problem #5 history of diabetes Problem #6 history of diastolic dysfunction Problem #7 metabolic bone disease: Her calcium is in range Problem #8 history of CVA Plan: We'll make arrangements for patient to get dialysis today We'll dialyze her for 4 hours and see if we can get about 3 L during dialysis. We'll check her CBC and renal panel in the morning. We'll hold Epogen for today.  Lewellyn Fultz S 10/03/2015, 10:53 AM

## 2015-10-03 NOTE — Care Management Note (Signed)
Case Management Note  Patient Details  Name: [name] MRN: [id number] Date of Birth: [birth year]  Subjective/Objective:                  Pt admitted with CHF. She is from [place]. She states she is ind with ADL's. She is on HD three days a week. She uses oxygen at baseline through [facility]. Pt has questions about refilling her port tank and when [facility] was contacted about inquiry, they report pt has HS oxygen and would hav eto pay $15 to have port tank refilled. Pt will have home O2 assessment prior to DC to see if she now qualifies for continuous home oxygen. Pt in HD today and assessment will not be completed until day to DC. Instructions left for discharging RN. [name] [name]) updated on O2 inquiry.   Action/Plan: Anticipate DC back to ALF, CSW will make arrangements for return to facility.   Expected Discharge Date:     10/04/2015             Expected Discharge Plan:  Assisted Living / Rest Home  In-House Referral:  Clinical Social Work  Discharge planning Services  CM Consult  Post Acute Care Choice:  Durable Medical Equipment Choice offered to:  Patient  DME Arranged:  Oxygen DME Agency:  [place]:    [place] Agency:     Status of Service:  Completed, signed off  If discussed at [employer] of Stay Meetings, dates discussed:    Additional Comments:  Sherald Barge, RN 10/03/2015, 1:32 PM

## 2015-10-03 NOTE — Care Management Obs Status (Signed)
[place] NOTIFICATION   Patient Details  Name: [name] MRN: [id number] Date of Birth: [birth year]   Medicare Observation Status Notification Given:  Yes    Sherald Barge, RN 10/03/2015, 1:42 PM

## 2015-10-03 NOTE — Progress Notes (Signed)
  CRITICAL VALUE ALERT  Critical value received:  Trop 0.04  Date of notification:  10/03/15  Time of notification:  0927  Critical value read back:Yes.    Nurse who received alert:  Sharyn Blitz, RN  MD notified (1st page):  Dr. Jerilee Hoh  Time of first page:  [phone]  MD notified (2nd page):  Time of second page:  Responding MD:   Time MD responded:

## 2015-10-03 NOTE — Clinical Social Work Note (Addendum)
Clinical Social Work Assessment  Patient Details  Name: [name] MRN: [id number] Date of Birth: 1925/11/02  Date of referral:  10/03/15               Reason for consult:  Discharge Planning                Permission sought to share information with:  Chartered certified accountant granted to share information::  Yes, Verbal Permission Granted  Name::        Agency::  [employer]  Relationship::  facility  Contact Information:     Housing/Transportation Living arrangements for the past 2 months:  [place] of Information:  Patient, Facility Patient Interpreter Needed:  None Criminal Activity/Legal Involvement Pertinent to Current Situation/Hospitalization:  No - Comment as needed Significant Relationships:  Adult Children Lives with:  Facility Resident Do you feel safe going back to the place where you live?  Yes Need for family participation in patient care:  No (Coment)  Care giving concerns:  None reported. Pt is long term resident at ALF.    Social Worker assessment / plan:  CSW met with pt at bedside. Pt very pleasant and reports she has been a resident at [facility] for several years. She has 10 children, 8 living and 5 live relatively close by. Her children are very involved and supportive. Per Danae Chen at facility, pt is fairly independent with ADLs. She requires assist with bathing and ambulates with a walker. No home health prior to admission and okay to return when stable. Pt is on dialysis Monday, Wednesday, Friday schedule at [facility].   Employment status:  Retired Nurse, adult PT Recommendations:  Not assessed at this time Information / Referral to community resources:  Other (Comment Required) (Return to [facility])  Patient/Family's Response to care:  Pt requests return to [facility] when medically stable.   Patient/Family's Understanding of and Emotional Response to Diagnosis,  Current Treatment, and Prognosis:  Pt states she is waiting on a procedure and is eager for it to be over so she can have a diet again. She said her daughter should be here later today as well so they can talk with MD together.   Emotional Assessment Appearance:  Appears stated age Attitude/Demeanor/Rapport:  Other (Pleasant) Affect (typically observed):  Appropriate Orientation:  Oriented to Self, Oriented to Place, Oriented to  Time, Oriented to Situation Alcohol / Substance use:  Not Applicable Psych involvement (Current and /or in the community):  No (Comment)  Discharge Needs  Concerns to be addressed:  Discharge Planning Concerns Readmission within the last 30 days:  No Current discharge risk:  None Barriers to Discharge:  Continued Medical Work up   [employer], [employer], [place] 10/03/2015, 9:26 AM [phone]

## 2015-10-03 NOTE — Procedures (Signed)
   HEMODIALYSIS TREATMENT NOTE:  4 hour low-heparin dialysis completed via left upper arm AVF (16g/antegrade). Goal met: 3 liters removed without interruption in ultrafiltration.  All blood was returned and hemostasis was achieved within 15 minutes. Report called to Sharyn Blitz, RN.  Rockwell Alexandria, RN, CDN

## 2015-10-03 NOTE — Progress Notes (Signed)
Patient seen and examined, database reviewed. Admitted earlier today from home with respiratory distress. Has a h/o ESRD and is due for HD today. She is already improved. Continue treatment for acute diastolic CHF. VQ low prob for PE. Hope for DC home in 24-48 hours. Will continue to follow.  Domingo Mend, MD [facility] Pager: [phone]

## 2015-10-04 DIAGNOSIS — I1311 Hypertensive heart and chronic kidney disease without heart failure, with stage 5 chronic kidney disease, or end stage renal disease: Secondary | ICD-10-CM | POA: Diagnosis not present

## 2015-10-04 DIAGNOSIS — Z992 Dependence on renal dialysis: Secondary | ICD-10-CM | POA: Diagnosis not present

## 2015-10-04 DIAGNOSIS — N186 End stage renal disease: Secondary | ICD-10-CM | POA: Diagnosis not present

## 2015-10-04 DIAGNOSIS — F0390 Unspecified dementia without behavioral disturbance: Secondary | ICD-10-CM | POA: Diagnosis not present

## 2015-10-04 DIAGNOSIS — R791 Abnormal coagulation profile: Secondary | ICD-10-CM | POA: Diagnosis not present

## 2015-10-04 DIAGNOSIS — I5033 Acute on chronic diastolic (congestive) heart failure: Secondary | ICD-10-CM | POA: Diagnosis not present

## 2015-10-04 DIAGNOSIS — F329 Major depressive disorder, single episode, unspecified: Secondary | ICD-10-CM | POA: Diagnosis not present

## 2015-10-04 DIAGNOSIS — J9610 Chronic respiratory failure, unspecified whether with hypoxia or hypercapnia: Secondary | ICD-10-CM | POA: Diagnosis not present

## 2015-10-04 LAB — GLUCOSE, CAPILLARY
GLUCOSE-CAPILLARY: 111 mg/dL — AB (ref 65–99)
Glucose-Capillary: 154 mg/dL — ABNORMAL HIGH (ref 65–99)

## 2015-10-04 LAB — HEMOGLOBIN A1C
Hgb A1c MFr Bld: 5.9 % — ABNORMAL HIGH (ref 4.8–5.6)
MEAN PLASMA GLUCOSE: 123 mg/dL

## 2015-10-04 LAB — HEPATITIS B SURFACE ANTIGEN: Hepatitis B Surface Ag: NEGATIVE

## 2015-10-04 NOTE — NC FL2 (Signed)
[place] MEDICAID FL2 LEVEL OF CARE SCREENING TOOL     IDENTIFICATION  Patient Name: [name] Birthdate: 1926-01-06 Sex: female Admission Date (Current Location): 10/02/2015  [place] Number:  [place] [id number] [place] and Address:  [facility] [address] [place]      Provider Number: [id number]  Attending Physician Name and Address:  Koleen Nimrod Acost*  Relative Name and Phone Number:       Current Level of Care: Hospital Recommended Level of Care: [place] Prior Approval Number:    Date Approved/Denied:   PASRR Number:    Discharge Plan: Other (Comment) (ALF)    Current Diagnoses: Patient Active Problem List   Diagnosis Date Noted  . Fluid overload 10/02/2015  . Hyperglycemia 10/02/2015  . D-dimer, elevated   . Depression   . Pseudoaneurysm of arteriovenous dialysis fistula ([place]) 05/29/2015  . Traumatic hematoma of scalp 02/12/2014  . Decreased hearing of both ears 02/12/2014  . At high risk for falls 02/12/2014  . Leg swelling 09/18/2013  . OA (osteoarthritis) of knee 09/18/2013  . MDD (major depressive disorder) ([place]) 04/05/2013  . Mechanical complication of other vascular device, implant, and graft 11/14/2012  . Pain in limb-Left arm 07/11/2012  . Hypothyroidism 06/08/2012  . Heartburn 03/23/2012  . Gait instability 01/13/2012  . Neuropathy of hand 01/13/2012  . Breast mass, right 07/11/2011  . Hemorrhoid 03/14/2011  . ESRD (end stage renal disease) on dialysis ([place]) 02/12/2011  . Bradycardia 02/01/2011  . Acute on chronic diastolic CHF (congestive heart failure) ([place]) 01/31/2011  . Dyspnea 01/17/2011  . Chronic respiratory failure ([place]) 01/13/2011  . Constipation 12/09/2010  . Insomnia 10/14/2010  . Diabetes mellitus with renal complications ([place]) 44/03/4740  . ALLERGIC RHINITIS 04/03/2008  . Benign hypertensive heart and kidney disease and CKD stage V ([place]) 08/07/2007  .  DEPENDENT EDEMA, LEGS, BILATERAL 11/17/2006  . HYPERLIPIDEMIA 10/10/2006  . ANEMIA OF RENAL FAILURE 10/10/2006  . ANXIETY 09/05/2006  . CATARACT NOS 09/05/2006  . IBS 09/05/2006  . ARTHRITIS 09/05/2006    Orientation RESPIRATION BLADDER Height & Weight     Self, Time, Situation, Place  O2 (2L) Continent Weight: 188 lb 7.9 oz (85.5 kg) Height:  5\' 6"  (167.6 cm)  BEHAVIORAL SYMPTOMS/MOOD NEUROLOGICAL BOWEL NUTRITION STATUS  Other (Comment) (none)  (n/a) Continent  (Fluid restriction)  AMBULATORY STATUS COMMUNICATION OF NEEDS Skin   Limited Assist Verbally Normal                       Personal Care Assistance Level of Assistance  Bathing, Feeding, Dressing Bathing Assistance: Limited assistance Feeding assistance: Limited assistance Dressing Assistance: Limited assistance     Functional Limitations Info  Sight, Hearing, Speech Sight Info: Adequate Hearing Info: Adequate Speech Info: Adequate    SPECIAL CARE FACTORS FREQUENCY                       Contractures Contractures Info: Not present    Additional Factors Info  Code Status Code Status Info: Full Code Allergies Info: NKA           Current Medications (10/04/2015):  This is the current hospital active medication list Current Facility-Administered Medications  Medication Dose Route Frequency Provider Last Rate Last Dose  . 0.9 %  sodium chloride infusion  250 mL Intravenous PRN Ilene Qua Opyd, MD      . 0.9 %  sodium chloride infusion  100 mL Intravenous PRN Fran Lowes, MD      . 0.9 %  sodium chloride infusion  100 mL Intravenous PRN Fran Lowes, MD      . acetaminophen (TYLENOL) tablet 650 mg  650 mg Oral Q4H PRN Ilene Qua Opyd, MD      . amLODipine (NORVASC) tablet 10 mg  10 mg Oral Daily Vianne Bulls, MD   10 mg at 10/04/15 1039  . aspirin EC tablet 81 mg  81 mg Oral Daily Vianne Bulls, MD   81 mg at 10/04/15 1039  . cinacalcet (SENSIPAR) tablet 30 mg  30 mg Oral Q supper  Vianne Bulls, MD   30 mg at 10/03/15 1813  . citalopram (CELEXA) tablet 20 mg  20 mg Oral Daily Vianne Bulls, MD   20 mg at 10/04/15 1039  . docusate sodium (COLACE) capsule 100 mg  100 mg Oral BID Ilene Qua Opyd, MD   100 mg at 10/04/15 1039  . famotidine (PEPCID) tablet 20 mg  20 mg Oral QHS Ilene Qua Opyd, MD   20 mg at 10/03/15 2210  . heparin injection 1,700 Units  20 Units/kg Dialysis PRN Fran Lowes, MD   1,700 Units at 10/03/15 1255  . heparin injection 5,000 Units  5,000 Units Subcutaneous Q8H Vianne Bulls, MD   5,000 Units at 10/04/15 0622  . insulin aspart (novoLOG) injection 0-5 Units  0-5 Units Subcutaneous QHS Vianne Bulls, MD   3 Units at 10/02/15 2250  . insulin aspart (novoLOG) injection 0-9 Units  0-9 Units Subcutaneous TID WC Vianne Bulls, MD   2 Units at 10/04/15 1213  . ipratropium-albuterol (DUONEB) 0.5-2.5 (3) MG/3ML nebulizer solution 3 mL  3 mL Nebulization Q4H PRN Vianne Bulls, MD   3 mL at 10/04/15 0949  . isosorbide mononitrate (IMDUR) 24 hr tablet 60 mg  60 mg Oral Daily Vianne Bulls, MD   60 mg at 10/04/15 1039  . latanoprost (XALATAN) 0.005 % ophthalmic solution 1 drop  1 drop Both Eyes QHS Vianne Bulls, MD   1 drop at 10/03/15 2210  . levothyroxine (SYNTHROID, LEVOTHROID) tablet 25 mcg  25 mcg Oral QAC breakfast Vianne Bulls, MD   25 mcg at 10/04/15 0754  . lidocaine (PF) (XYLOCAINE) 1 % injection 5 mL  5 mL Intradermal PRN Fran Lowes, MD      . lidocaine-prilocaine (EMLA) cream 1 application  1 application Topical PRN Fran Lowes, MD      . mirtazapine (REMERON) tablet 7.5 mg  7.5 mg Oral QHS Ilene Qua Opyd, MD   7.5 mg at 10/03/15 2210  . ondansetron (ZOFRAN) injection 4 mg  4 mg Intravenous Q6H PRN Vianne Bulls, MD      . pentafluoroprop-tetrafluoroeth (GEBAUERS) aerosol 1 application  1 application Topical PRN Fran Lowes, MD      . polyethylene glycol (MIRALAX / GLYCOLAX) packet 17 g  17 g Oral Daily Estela Leonie Green, MD   17 g at 10/04/15 1039  . polyvinyl alcohol (LIQUIFILM TEARS) 1.4 % ophthalmic solution 1 drop  1 drop Both Eyes QID PRN Vianne Bulls, MD      . sevelamer carbonate (RENVELA) tablet 2,400 mg  2,400 mg Oral TID WC Vianne Bulls, MD   2,400 mg at 10/04/15 1213  . sevelamer carbonate (RENVELA) tablet 800 mg  800 mg Oral BID PRN Vianne Bulls, MD      . sodium  chloride flush (NS) 0.9 % injection 3 mL  3 mL Intravenous Q12H Ilene Qua Opyd, MD   3 mL at 10/04/15 1000  . sodium chloride flush (NS) 0.9 % injection 3 mL  3 mL Intravenous PRN Vianne Bulls, MD         Discharge Medications: Please see discharge summary for a list of discharge medications.  Relevant Imaging Results:  Relevant Lab Results:   Additional Information SS#: [id number]  Weston Anna, LCSW

## 2015-10-04 NOTE — Discharge Summary (Signed)
Physician Discharge Summary  [name] YYF:[id number] DOB: Aug 03, 1925 DOA: 10/02/2015  PCP: Vic Blackbird, MD  Admit date: 10/02/2015 Discharge date: 10/04/2015  Time spent: 45 minutes  Recommendations for Outpatient Follow-up:  -Will be discharged home today. -Advised to follow up with HD center as scheduled.   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 Myna Hidalgo on 9/21: [name] is a 82 y.o. female with medical history significant for end-stage renal disease on hemodialysis, chronic diastolic CHF, depression, chronic respiratory failure with hypoxia on supplemental oxygen at 2 L/m, and hypothyroidism who presents the emergency department from her nursing home for evaluation of dyspnea. Patient reports that she had been in her usual state of health until the insidious onset of dyspnea approximately 4 days ago. Symptoms have progressively worsened over that interval and she was reportedly evaluated in an emergency department 2 days ago, diagnosed with COPD exacerbation, and discharge back to the nursing facility with prednisone and breathing treatments. Despite those measures, the patient's symptoms continued to worsen and she presents for evaluation of this. Patient underwent today a dialysis session today prior to her admission and completed dissection without incident. There has been no fevers or  chills and no chest pain or palpitations. There is some chronic orthopnea, possibly worsened slightly. She has a cough which is nonproductive and not associated with pain. There has been no recent weight gain, dietary indiscretion, or change in medications aside from the recent addition of prednisone.  ED Course: Upon arrival to the ED, patient is found to be afebrile, saturating well on 2 L/m nasal cannula, bradycardic in the high 50s, and with stable blood pressure. EKG demonstrates sinus rhythm with VPCs, first-degree AV nodal block, and LVH with repolarization abnormality. Chest x-ray features mild interstitial prominence without convincing pulmonary edema. Chemistry panel is notable for a mild hyponatremia and hypochloremia, BUN 72, creatinine 7.50, and serum glucose 223. CBC is notable for a stable normocytic anemia with hemoglobin of 10.5. BNP is elevated to a value of 1135. Patient was treated with 80 mg IV Lasix, DuoNeb, and 125 mg IV Solu-Medrol in the emergency department. She continues to saturate well on 2 L/m via nasal cannula and has remained hemodynamically stable. She'll be observed on the telemetry unit for ongoing evaluation and management of worsening respiratory distress.   Hospital Course:   Acute Hypoxemic Respiratory Failure -Likely due to acute CHF, no wheezing, doubt pulmonary etiology. -See below for details.  ESRD -Received HD on 9/22. -Will follow her regular HD session.  Acute on Chronic Diastolic CHF -ECHO 5/67: EF 01-41% grade 1 diastolic dysfunction. -Mainly managed with HD given her ESRD state.  Hypothyroidism -Continue synthroid.  Procedures:  None   Consultations:  Nephrology  Discharge Instructions  Discharge Instructions    Diet - low sodium heart healthy    Complete by:  As directed    Increase activity slowly    Complete by:  As directed        Medication List    TAKE these medications   ACCU-CHEK FASTCLIX LANCET Kit Use to monitor  FSBS 1 time daily. DX: 250.00   ACCU-CHEK NANO SMARTVIEW w/Device Kit Use to monitor FSBS 1 time daily. DX: 250.00   ACCU-CHEK SMARTVIEW CONTROL Liqd Use to monitor FSBS 1 time daily. DX: 250.00   albuterol 108 (90 Base) MCG/ACT inhaler Commonly known as:  PROVENTIL HFA;VENTOLIN HFA Inhale 2 puffs into the lungs every 4 (four) hours as needed for wheezing or shortness of breath.   albuterol (2.5 MG/3ML) 0.083% nebulizer solution Commonly known as:  PROVENTIL USE 1 VIAL IN NEBULIZER 3 TIMES DAILY AS NEEDED FOR WHEEZING/SHORTNESS OF BREATH.   amLODipine 10 MG tablet Commonly known as:  NORVASC Take 1 tablet (10 mg total) by mouth every morning.   ASPIRIN LOW DOSE 81 MG EC tablet Generic drug:  aspirin TAKE (1) TABLET BY MOUTH ONCE DAILY.   bisacodyl 10 MG suppository Commonly known as:  DULCOLAX 1 suppository rectally  Daily every 3 days as needed for constipation What changed:  how much to take  how to take this  when to take this  reasons to take this  additional instructions   citalopram 20 MG tablet Commonly known as:  CELEXA Take 1 tablet (20 mg total) by mouth every morning.   DAILY-VITE Tabs TAKE 1 TABLET BY MOUTH ONCE DAILY. (ON DAY OF DIALYSIS, TAKE AFTER DIALYSIS)   docusate sodium 100 MG capsule Commonly known as:  COLACE Take 1 capsule (100 mg total) by mouth 2 (two) times daily.   etodolac 400 MG tablet Commonly known as:  LODINE Take 400 mg by mouth every 12 (twelve) hours as needed for mild pain.   glucose blood test strip Commonly known as:  ACCU-CHEK SMARTVIEW Use to monitor FSBS 1 time daily. DX: 250.00   ipratropium 0.02 % nebulizer solution Commonly known as:  ATROVENT USE 1 VIAL IN NEBULIZER 3 TIMES DAILY AS NEEDED FOR WHEEZING/SHORTNESS OF BREATH.   isosorbide mononitrate 60 MG 24 hr tablet Commonly known as:  IMDUR Take 1 tablet (60 mg total) by mouth daily.   levothyroxine 25 MCG tablet Commonly known as:  SYNTHROID,  LEVOTHROID TAKE (1) TABLET BY MOUTH ONCE DAILY BEFORE BREAKFAST.   lidocaine-prilocaine cream Commonly known as:  EMLA Apply topically as needed. Apply to L upper arm (1) hour prior to dialysis on MWF. Wrap with plastic wrap after applying.   mirtazapine 15 MG tablet Commonly known as:  REMERON TAKE 1/2 TABLET (7.5MG) BY MOUTH AT BEDTIME FOR MOOD/APPETITE.   nitroGLYCERIN 0.4 MG SL tablet Commonly known as:  NITROSTAT DISSOLVE 1 TABLET UNDER THE TONGUE EVERY 5 MINUTES X2 DOSES THEN TO ER   OXYGEN Inhale 2 L into the lungs daily as needed (shortness of breath).   polyethylene glycol powder powder Commonly known as:  MIRALAX Take 17 g by mouth daily. Hold for diarrhea   predniSONE 20 MG tablet Commonly known as:  DELTASONE Take 20 mg by mouth daily.   Q-NOL 325 MG tablet Generic drug:  acetaminophen TAKE (2) TABLETS FOUR TIMES DAILY AS NEEDED FOR PAIN. -MAY MAKE DROWSY -   ranitidine 150 MG tablet Commonly known as:  ZANTAC TAKE 1 TABLET BY MOUTH AT BEDTIME.   SENSIPAR 30 MG tablet Generic drug:  cinacalcet TAKE 1 TABLET BY MOUTH ONCE DAILY AFTER THE EVENING MEAL  sevelamer carbonate 800 MG tablet Commonly known as:  RENVELA Take 800-2,400 mg by mouth See admin instructions. 3 tabs with each meal and 1tab twice daily with snacks   SYSTANE OP Apply 1 drop to eye 4 (four) times daily as needed (dry eyes).   traMADol 50 MG tablet Commonly known as:  ULTRAM Take 1 tablet (50 mg total) by mouth 2 (two) times daily as needed. What changed:  when to take this  reasons to take this   Travoprost (BAK Free) 0.004 % Soln ophthalmic solution Commonly known as:  TRAVATAN Place 1 drop into both eyes at bedtime.      No Known Allergies Follow-up Information    Vic Blackbird, MD. Schedule an appointment as soon as possible for a visit in 2 week(s).   Specialty:  Family Medicine Contact information: [address] [place]  [phone]             The results of significant diagnostics from this hospitalization (including imaging, microbiology, ancillary and laboratory) are listed below for reference.    Significant Diagnostic Studies: Nm Pulmonary Perf And Vent  Result Date: 10/03/2015 CLINICAL DATA:  Shortness of breath, elevated D-dimer EXAM: NUCLEAR MEDICINE VENTILATION - PERFUSION LUNG SCAN TECHNIQUE: Ventilation images were obtained in multiple projections using inhaled aerosol Tc-37mDTPA. Perfusion images were obtained in multiple projections after intravenous injection of Tc-954mAA. RADIOPHARMACEUTICALS:  33.0 mCi Technetium-9974mPA aerosol inhalation and 4.3 mCi Technetium-31m5m IV COMPARISON:  Chest x-ray 10/02/2015 FINDINGS: Ventilation: No focal ventilation defect. Perfusion: No wedge shaped peripheral perfusion defects to suggest acute pulmonary embolism. IMPRESSION: No evidence of pulmonary embolus. Electronically Signed   By: KeviRolm Baptise.   On: 10/03/2015 09:31   Dg Chest Portable 1 View  Result Date: 10/02/2015 CLINICAL DATA:  Shortness of breath for 4 days EXAM: PORTABLE CHEST 1 VIEW COMPARISON:  09/30/2015 FINDINGS: Cardiomegaly again noted. Chronic elevation of the left hemidiaphragm. Atherosclerotic calcifications of thoracic aorta. Bilateral shoulder degenerative changes. Mild interstitial prominence bilateral without convincing pulmonary edema. No segmental infiltrate. Left basilar atelectasis. IMPRESSION: Chronic elevation of the left hemidiaphragm. Atherosclerotic calcifications of thoracic aorta. Bilateral shoulder degenerative changes. Mild interstitial prominence bilateral without convincing pulmonary edema. No segmental infiltrate. Left basilar atelectasis. Electronically Signed   By: LiviLahoma Crocker.   On: 10/02/2015 15:09    Microbiology: Recent Results (from the past 240 hour(s))  MRSA PCR Screening     Status: None   Collection Time: 10/02/15  8:49 PM  Result Value Ref Range Status   MRSA by  PCR NEGATIVE NEGATIVE Final    Comment:        The GeneXpert MRSA Assay (FDA approved for NASAL specimens only), is one component of a comprehensive MRSA colonization surveillance program. It is not intended to diagnose MRSA infection nor to guide or monitor treatment for MRSA infections.      Labs: Basic Metabolic Panel:  Recent Labs Lab 10/02/15 1507 10/03/15 0209  NA 134* 130*  K 4.4 4.7  CL 93* 100*  CO2 29 27  GLUCOSE 223* 282*  BUN 72* 86*  CREATININE 7.50* 8.35*  CALCIUM 9.2 8.2*   Liver Function Tests: No results for input(s): AST, ALT, ALKPHOS, BILITOT, PROT, ALBUMIN in the last 168 hours. No results for input(s): LIPASE, AMYLASE in the last 168 hours. No results for input(s): AMMONIA in the last 168 hours. CBC:  Recent Labs Lab 10/02/15 1507  WBC 9.3  NEUTROABS 8.1*  HGB  10.5*  HCT 32.7*  MCV 79.0  PLT 197   Cardiac Enzymes:  Recent Labs Lab 10/02/15 1507 10/03/15 0209 10/03/15 0814  TROPONINI 0.04* 0.03* 0.04*   BNP: BNP (last 3 results)  Recent Labs  10/02/15 1600  BNP 1,135.0*    ProBNP (last 3 results) No results for input(s): PROBNP in the last 8760 hours.  CBG:  Recent Labs Lab 10/03/15 1140 10/03/15 1812 10/03/15 2210 10/04/15 0737 10/04/15 1156  GLUCAP 116* 136* 181* 111* 154*       Signed:  HERNANDEZ ACOSTA,ESTELA  [facility] Pager: [phone] 10/04/2015, 12:23 PM

## 2015-10-04 NOTE — Progress Notes (Addendum)
CM contacted [facility] and spoke with Quillian Quince.  Patient is currently an existing customer for her oxygen with [facility].  Just will need the information to contact regarding delivery.  CM faxed information to [facility] for review.  Followed up patient had tank delivered by [facility] already in room to d/c.

## 2015-10-04 NOTE — Progress Notes (Signed)
Reviewed discharge instructions with client and son, instructed on when next dose due of medications. Instructed on signs to report. Understanding voiced to instructions. Client discharged to [place] with son in stable condition.

## 2015-10-04 NOTE — Progress Notes (Signed)
Subjective: Interval History: has complaints of some sore throat otherwise feels much better. Presently she doesn't have any cough and no difficulty in breathing..  Objective: Vital signs in last 24 hours: Temp:  [97.7 F (36.5 C)-98.7 F (37.1 C)] 98.7 F (37.1 C) (09/23 0624) Pulse Rate:  [55-67] 65 (09/23 0624) Resp:  [16-20] 20 (09/23 0624) BP: (106-164)/(44-73) 164/47 (09/23 0624) SpO2:  [96 %-100 %] 97 % (09/23 0624) Weight:  [85.5 kg (188 lb 7.9 oz)] 85.5 kg (188 lb 7.9 oz) (09/22 1250) Weight change: 3.852 kg (8 lb 7.9 oz)  Intake/Output from previous day: 09/22 0701 - 09/23 0700 In: -  Out: 3000  Intake/Output this shift: No intake/output data recorded.  General appearance: alert, cooperative and no distress Resp: diminished breath sounds bilaterally Cardio: regular rate and rhythm GI: soft, non-tender; bowel sounds normal; no masses,  no organomegaly Extremities: no edema, redness or tenderness in the calves or thighs  Lab Results:  Recent Labs  10/02/15 1507  WBC 9.3  HGB 10.5*  HCT 32.7*  PLT 197   BMET:  Recent Labs  10/02/15 1507 10/03/15 0209  NA 134* 130*  K 4.4 4.7  CL 93* 100*  CO2 29 27  GLUCOSE 223* 282*  BUN 72* 86*  CREATININE 7.50* 8.35*  CALCIUM 9.2 8.2*   No results for input(s): PTH in the last 72 hours. Iron Studies: No results for input(s): IRON, TIBC, TRANSFERRIN, FERRITIN in the last 72 hours.  Studies/Results: Nm Pulmonary Perf And Vent  Result Date: 10/03/2015 CLINICAL DATA:  Shortness of breath, elevated D-dimer EXAM: NUCLEAR MEDICINE VENTILATION - PERFUSION LUNG SCAN TECHNIQUE: Ventilation images were obtained in multiple projections using inhaled aerosol Tc-32m DTPA. Perfusion images were obtained in multiple projections after intravenous injection of Tc-23m MAA. RADIOPHARMACEUTICALS:  33.0 mCi Technetium-67m DTPA aerosol inhalation and 4.3 mCi Technetium-80m MAA IV COMPARISON:  Chest x-ray 10/02/2015 FINDINGS: Ventilation:  No focal ventilation defect. Perfusion: No wedge shaped peripheral perfusion defects to suggest acute pulmonary embolism. IMPRESSION: No evidence of pulmonary embolus. Electronically Signed   By: Rolm Baptise M.D.   On: 10/03/2015 09:31   Dg Chest Portable 1 View  Result Date: 10/02/2015 CLINICAL DATA:  Shortness of breath for 4 days EXAM: PORTABLE CHEST 1 VIEW COMPARISON:  09/30/2015 FINDINGS: Cardiomegaly again noted. Chronic elevation of the left hemidiaphragm. Atherosclerotic calcifications of thoracic aorta. Bilateral shoulder degenerative changes. Mild interstitial prominence bilateral without convincing pulmonary edema. No segmental infiltrate. Left basilar atelectasis. IMPRESSION: Chronic elevation of the left hemidiaphragm. Atherosclerotic calcifications of thoracic aorta. Bilateral shoulder degenerative changes. Mild interstitial prominence bilateral without convincing pulmonary edema. No segmental infiltrate. Left basilar atelectasis. Electronically Signed   By: Lahoma Crocker M.D.   On: 10/02/2015 15:09    I have reviewed the patient's current medications.  Assessment/Plan: Problem #1 difficulty breathing: Presently patient seems to be feeling much better. We're able to remove about 3 L on dialysis. Problem #2 end-stage renal disease she is status post hemodialysis yesterday. Presently she doesn't have any nausea or vomiting her appetite is good. Problem #3 hypertension: Her blood pressure is reasonably controlled Problem #4 metabolic bone disease: Calcium is range but phosphorus is not available. Patient is on a binder. Problem #5 anemia: Her hemoglobin his was in our target goal and patient is on Epogen. Problem #6 hypothyroidism: She is on Synthroid. Problem #7 history of arthritis Plan: We'll continue his present management We'll check a renal panel in the morning. We'll evaluate patient and make a decision  whether she will require dialysis tomorrow otherwise her regular schedule would  be on Monday.   LOS: 0 days   Reshunda Strider S 10/04/2015,8:16 AM

## 2015-10-05 DIAGNOSIS — F0390 Unspecified dementia without behavioral disturbance: Secondary | ICD-10-CM | POA: Diagnosis not present

## 2015-10-06 DIAGNOSIS — E119 Type 2 diabetes mellitus without complications: Secondary | ICD-10-CM | POA: Diagnosis not present

## 2015-10-06 DIAGNOSIS — F0390 Unspecified dementia without behavioral disturbance: Secondary | ICD-10-CM | POA: Diagnosis not present

## 2015-10-06 DIAGNOSIS — N186 End stage renal disease: Secondary | ICD-10-CM | POA: Diagnosis not present

## 2015-10-06 DIAGNOSIS — E785 Hyperlipidemia, unspecified: Secondary | ICD-10-CM | POA: Diagnosis not present

## 2015-10-06 DIAGNOSIS — Z23 Encounter for immunization: Secondary | ICD-10-CM | POA: Diagnosis not present

## 2015-10-06 DIAGNOSIS — Z992 Dependence on renal dialysis: Secondary | ICD-10-CM | POA: Diagnosis not present

## 2015-10-06 DIAGNOSIS — Z79899 Other long term (current) drug therapy: Secondary | ICD-10-CM | POA: Diagnosis not present

## 2015-10-07 ENCOUNTER — Encounter: Payer: Self-pay | Admitting: Family Medicine

## 2015-10-07 ENCOUNTER — Ambulatory Visit (INDEPENDENT_AMBULATORY_CARE_PROVIDER_SITE_OTHER): Payer: Commercial Managed Care - HMO | Admitting: Family Medicine

## 2015-10-07 VITALS — BP 158/82 | HR 68 | Temp 98.9°F | Resp 18 | Ht 66.0 in | Wt 189.0 lb

## 2015-10-07 DIAGNOSIS — N186 End stage renal disease: Secondary | ICD-10-CM

## 2015-10-07 DIAGNOSIS — Z992 Dependence on renal dialysis: Secondary | ICD-10-CM | POA: Diagnosis not present

## 2015-10-07 DIAGNOSIS — I5032 Chronic diastolic (congestive) heart failure: Secondary | ICD-10-CM | POA: Diagnosis not present

## 2015-10-07 DIAGNOSIS — E1122 Type 2 diabetes mellitus with diabetic chronic kidney disease: Secondary | ICD-10-CM

## 2015-10-07 DIAGNOSIS — J209 Acute bronchitis, unspecified: Secondary | ICD-10-CM | POA: Diagnosis not present

## 2015-10-07 DIAGNOSIS — F0390 Unspecified dementia without behavioral disturbance: Secondary | ICD-10-CM | POA: Diagnosis not present

## 2015-10-07 DIAGNOSIS — J9611 Chronic respiratory failure with hypoxia: Secondary | ICD-10-CM

## 2015-10-07 NOTE — Assessment & Plan Note (Addendum)
Diet-controlled diabetes mellitus her last A1c 5.9%. While she is on the prednisone we will check her fasting blood sugars daily. I did liberalize her diet to no salt added with her age we need to get any intake in in her renal restrictions were limiting her significantly. Family and pt wanted change as well

## 2015-10-07 NOTE — Patient Instructions (Addendum)
Oxygen to be worn three times a day  Complete prednisone Drink glass of water with meals  F/U as previous

## 2015-10-07 NOTE — Assessment & Plan Note (Signed)
Chronic diastolic heart failure in the setting of chronic respiratory failure. She uses oxygen at bedtime previously as needed now she is using regular especially since her illness. With minimal exertion her saturations dropped significantly into the low 80s she's to use her oxygen while ambulating as well. We will make sure that she does have a home fill concentrator.

## 2015-10-07 NOTE — Progress Notes (Signed)
Subjective:    Patient ID: [name], female    DOB: 02-Aug-1925, 82 y.o.   MRN: [id number]  Patient presents for Hospital F/U (was in [place] x3 days- COPD) Here for hospital follow-up. She was admitted to the hospital last Thursday after having shortness of breath and cough. She had been seen at [facility] ER for SOB on 9/20  diagnosed with COPD given prednisone and nebs and discharged back home SNF. She went to back to the ER on 9/21-9/23 for progressive symptoms diagnosed with acute on chronic heart failure exacerbation as well acute respiratory distress. She had chest x-ray which showed interstitial prominence however no significant pulmonary edema her chemistry panel showed mild hyponatremia and her BNP was elevated to 1135. She was treated with Lasix and continue treatment for bronchitis exacerbation with IV Solu-Medrol and duo nebs. She remained on 2 L of oxygen throughout her entire stay.   She is currently on prednisone which is to end on  9/28 no other new medications were given. She states she was not given any antibiotics. She was given nebulizer treatments she took her last one yesterday and she has not required oxygen almost 24 hours a day. Unfortunately she does not have a fill unit therefore is been staying in her room most the day to stay on her oxygen. That is the major concern today is getting her oxygen equipment.  I did proceed notification about taking her blood sugars while she is on the prednisone will  continue doing this daily for another 2 weeks until the prednisone is completely out of her system then she can discontinue that she is not on current diabetes regimen.  There is also a concern about her diet she often does not want to eat what is provided to her on a renal diet therefore I will liberalize the diet to no salt added to give her more options at her age. Also noted that she. A little dry today she states that she is only had 1 glass of water. Her daughters are  concerned that she often will only drink one or 2 glasses of water a day. Especially the day before dialysis she thinks that she will have too much water on her    Review Of Systems:  GEN- denies fatigue, fever, weight loss,weakness, recent illness HEENT- denies eye drainage, change in vision, nasal discharge, CVS- denies chest pain, palpitations RESP- + SOB,  Denies cough, wheeze ABD- denies N/V, change in stools, abd pain GU- denies dysuria, hematuria, dribbling, incontinence MSK- denies joint pain, muscle aches, injury Neuro- denies headache, dizziness, syncope, seizure activity       Objective:    BP (!) 158/82 (BP Location: Right Arm, Patient Position: Sitting, Cuff Size: Normal)   Pulse 68   Temp 98.9 F (37.2 C) (Oral)   Resp 18   Ht 5\' 6"  (1.676 m)   Wt 189 lb (85.7 kg)   LMP 12/24/2010   SpO2 (!) 84%   BMI 30.51 kg/m  GEN- NAD, alert and oriented x3,walks with walker HEENT- PERRL, EOMI, non injected sclera, pink conjunctiva, MM a little dry,  oropharynx clear Neck- Supple, no thyromegaly CVS- RRR,  3/6 SEM  RESP-CTAB ABD-NABS,soft,NT,ND,  EXT- No edema Pulses- Radial, DP- 2+  Walking saturation drop to 84% RA , recovery back to 935     Assessment & Plan:      Problem List Items Addressed This Visit    ESRD (end stage renal disease) on dialysis ([place])  Diabetes mellitus with renal complications ([place])    Diet-controlled diabetes mellitus her last A1c 5.9%. While she is on the prednisone we will check her fasting blood sugars daily. I did liberalize her diet to no salt added with her age we need to get any intake in in her renal restrictions were limiting her significantly. Family and pt wanted change as well      Chronic respiratory failure (HCC) (Chronic)   Chronic diastolic heart failure (HCC) - Primary    Chronic diastolic heart failure in the setting of chronic respiratory failure. She uses oxygen at bedtime previously as needed now she is using regular  especially since her illness. With minimal exertion her saturations dropped significantly into the low 80s she's to use her oxygen while ambulating as well. We will make sure that she does have a home fill concentrator.       Other Visit Diagnoses    Acute bronchitis, unspecified organism       Complete prednisone, no longer requires nebs      Note: This dictation was prepared with Dragon dictation along with smaller phrase technology. Any transcriptional errors that result from this process are unintentional.

## 2015-10-08 DIAGNOSIS — N186 End stage renal disease: Secondary | ICD-10-CM | POA: Diagnosis not present

## 2015-10-08 DIAGNOSIS — F0390 Unspecified dementia without behavioral disturbance: Secondary | ICD-10-CM | POA: Diagnosis not present

## 2015-10-08 DIAGNOSIS — Z23 Encounter for immunization: Secondary | ICD-10-CM | POA: Diagnosis not present

## 2015-10-08 DIAGNOSIS — Z992 Dependence on renal dialysis: Secondary | ICD-10-CM | POA: Diagnosis not present

## 2015-10-09 ENCOUNTER — Telehealth: Payer: Self-pay | Admitting: *Deleted

## 2015-10-09 ENCOUNTER — Telehealth: Payer: Self-pay | Admitting: Family Medicine

## 2015-10-09 DIAGNOSIS — J441 Chronic obstructive pulmonary disease with (acute) exacerbation: Secondary | ICD-10-CM | POA: Diagnosis not present

## 2015-10-09 DIAGNOSIS — I130 Hypertensive heart and chronic kidney disease with heart failure and stage 1 through stage 4 chronic kidney disease, or unspecified chronic kidney disease: Secondary | ICD-10-CM | POA: Diagnosis not present

## 2015-10-09 DIAGNOSIS — N186 End stage renal disease: Secondary | ICD-10-CM | POA: Diagnosis not present

## 2015-10-09 DIAGNOSIS — J209 Acute bronchitis, unspecified: Secondary | ICD-10-CM | POA: Diagnosis not present

## 2015-10-09 DIAGNOSIS — D649 Anemia, unspecified: Secondary | ICD-10-CM | POA: Diagnosis not present

## 2015-10-09 DIAGNOSIS — E1122 Type 2 diabetes mellitus with diabetic chronic kidney disease: Secondary | ICD-10-CM | POA: Diagnosis not present

## 2015-10-09 DIAGNOSIS — J9621 Acute and chronic respiratory failure with hypoxia: Secondary | ICD-10-CM | POA: Diagnosis not present

## 2015-10-09 DIAGNOSIS — Z992 Dependence on renal dialysis: Secondary | ICD-10-CM | POA: Diagnosis not present

## 2015-10-09 DIAGNOSIS — F0390 Unspecified dementia without behavioral disturbance: Secondary | ICD-10-CM | POA: Diagnosis not present

## 2015-10-09 DIAGNOSIS — I5033 Acute on chronic diastolic (congestive) heart failure: Secondary | ICD-10-CM | POA: Diagnosis not present

## 2015-10-09 MED ORDER — BLOOD GLUCOSE TEST VI STRP: ORAL_STRIP | 1 refills | Status: DC

## 2015-10-09 MED ORDER — LANCET DEVICES MISC: 1 refills | Status: DC

## 2015-10-09 MED ORDER — LANCETS MISC: 1 refills | Status: DC

## 2015-10-09 MED ORDER — BLOOD GLUCOSE SYSTEM PAK KIT: PACK | 1 refills | Status: DC

## 2015-10-09 NOTE — Telephone Encounter (Signed)
Received vm asking about patient's oxygen they state she still hasn't gotten any.   CB# [phone]

## 2015-10-09 NOTE — Telephone Encounter (Signed)
Spoke with patient and facility in regards to O2.   [facility] requesting clarification on order.   Order to be faxed.

## 2015-10-09 NOTE — Telephone Encounter (Signed)
Garen Grams the Resident Care Coordinator at [facility] called and states she needs a  RX for a One Touch meter and also the One Touch  test strips for the patient . On the same order they need how often she is tested and also the diagnosed code. Seth Bake states that the patient  has not had her blood sugar checked since she came to the dr. The meter they have at the facility is not working for the patient. The pharmacy is [facility]. Fax # [phone]. Please advise provider. Thank you

## 2015-10-09 NOTE — Telephone Encounter (Signed)
New prescription sent to [facility].   Order clarification sent to [facility].

## 2015-10-09 NOTE — Telephone Encounter (Signed)
Alisha from [facility] left msg last night she states she is sorry that she has taken a little time to get back with [place]. She states that she did confirm that she will need a new prescription for patient that states continuous oxygen. If she needs portable oxygen only patient will need an evaluation.  CB# [phone]

## 2015-10-09 NOTE — Telephone Encounter (Signed)
New order to be faxed to [facility].

## 2015-10-10 ENCOUNTER — Telehealth: Payer: Self-pay | Admitting: *Deleted

## 2015-10-10 DIAGNOSIS — N186 End stage renal disease: Secondary | ICD-10-CM | POA: Diagnosis not present

## 2015-10-10 DIAGNOSIS — J209 Acute bronchitis, unspecified: Secondary | ICD-10-CM | POA: Diagnosis not present

## 2015-10-10 DIAGNOSIS — E1122 Type 2 diabetes mellitus with diabetic chronic kidney disease: Secondary | ICD-10-CM | POA: Diagnosis not present

## 2015-10-10 DIAGNOSIS — I5033 Acute on chronic diastolic (congestive) heart failure: Secondary | ICD-10-CM | POA: Diagnosis not present

## 2015-10-10 DIAGNOSIS — J441 Chronic obstructive pulmonary disease with (acute) exacerbation: Secondary | ICD-10-CM | POA: Diagnosis not present

## 2015-10-10 DIAGNOSIS — Z992 Dependence on renal dialysis: Secondary | ICD-10-CM | POA: Diagnosis not present

## 2015-10-10 DIAGNOSIS — D649 Anemia, unspecified: Secondary | ICD-10-CM | POA: Diagnosis not present

## 2015-10-10 DIAGNOSIS — J9621 Acute and chronic respiratory failure with hypoxia: Secondary | ICD-10-CM | POA: Diagnosis not present

## 2015-10-10 DIAGNOSIS — F0390 Unspecified dementia without behavioral disturbance: Secondary | ICD-10-CM | POA: Diagnosis not present

## 2015-10-10 DIAGNOSIS — I130 Hypertensive heart and chronic kidney disease with heart failure and stage 1 through stage 4 chronic kidney disease, or unspecified chronic kidney disease: Secondary | ICD-10-CM | POA: Diagnosis not present

## 2015-10-10 DIAGNOSIS — Z23 Encounter for immunization: Secondary | ICD-10-CM | POA: Diagnosis not present

## 2015-10-10 NOTE — Telephone Encounter (Signed)
Received call from [facility] with [facility]. [phone], ext [phone].  Reports that insurance requires specific documentation in regards to O2 testing (resting on RA, ambulating on RA, and ambulating on O2 with improvement).   States that current documentation from [place] on 10/07/2015 does not show ambulation with O2 with improvement, therefore, O2 cannot be delivered.   MD please advise.

## 2015-10-10 NOTE — Telephone Encounter (Signed)
Patient family is unable to bring in patient for testing next week.   MD made aware and new orders given to have facility check O2 sats while walking and then with O2.  Call placed to facility to inquire if sats can be obtained. Spoke with Lovena Le, Med Tech.   Lovena Le will call back with either readings or if testing cannot be performed.   Call placed to patient daughter and Zina made aware.

## 2015-10-10 NOTE — Telephone Encounter (Signed)
Call pt family, bring her back and walk her here in the office

## 2015-10-11 DIAGNOSIS — Z992 Dependence on renal dialysis: Secondary | ICD-10-CM | POA: Diagnosis not present

## 2015-10-11 DIAGNOSIS — F0390 Unspecified dementia without behavioral disturbance: Secondary | ICD-10-CM | POA: Diagnosis not present

## 2015-10-11 DIAGNOSIS — N186 End stage renal disease: Secondary | ICD-10-CM | POA: Diagnosis not present

## 2015-10-12 DIAGNOSIS — F0390 Unspecified dementia without behavioral disturbance: Secondary | ICD-10-CM | POA: Diagnosis not present

## 2015-10-13 DIAGNOSIS — N186 End stage renal disease: Secondary | ICD-10-CM | POA: Diagnosis not present

## 2015-10-13 DIAGNOSIS — Z992 Dependence on renal dialysis: Secondary | ICD-10-CM | POA: Diagnosis not present

## 2015-10-13 DIAGNOSIS — F0390 Unspecified dementia without behavioral disturbance: Secondary | ICD-10-CM | POA: Diagnosis not present

## 2015-10-13 NOTE — Telephone Encounter (Signed)
Call placed to facility.   Spoke with Denice Paradise.   Advised that facility is not allowed to perform O2 sats.

## 2015-10-14 ENCOUNTER — Encounter: Payer: Self-pay | Admitting: *Deleted

## 2015-10-14 DIAGNOSIS — J441 Chronic obstructive pulmonary disease with (acute) exacerbation: Secondary | ICD-10-CM | POA: Diagnosis not present

## 2015-10-14 DIAGNOSIS — Z992 Dependence on renal dialysis: Secondary | ICD-10-CM | POA: Diagnosis not present

## 2015-10-14 DIAGNOSIS — I130 Hypertensive heart and chronic kidney disease with heart failure and stage 1 through stage 4 chronic kidney disease, or unspecified chronic kidney disease: Secondary | ICD-10-CM | POA: Diagnosis not present

## 2015-10-14 DIAGNOSIS — I5033 Acute on chronic diastolic (congestive) heart failure: Secondary | ICD-10-CM | POA: Diagnosis not present

## 2015-10-14 DIAGNOSIS — N186 End stage renal disease: Secondary | ICD-10-CM | POA: Diagnosis not present

## 2015-10-14 DIAGNOSIS — D649 Anemia, unspecified: Secondary | ICD-10-CM | POA: Diagnosis not present

## 2015-10-14 DIAGNOSIS — E1122 Type 2 diabetes mellitus with diabetic chronic kidney disease: Secondary | ICD-10-CM | POA: Diagnosis not present

## 2015-10-14 DIAGNOSIS — J9621 Acute and chronic respiratory failure with hypoxia: Secondary | ICD-10-CM | POA: Diagnosis not present

## 2015-10-14 DIAGNOSIS — F0390 Unspecified dementia without behavioral disturbance: Secondary | ICD-10-CM | POA: Diagnosis not present

## 2015-10-14 DIAGNOSIS — J209 Acute bronchitis, unspecified: Secondary | ICD-10-CM | POA: Diagnosis not present

## 2015-10-14 NOTE — Progress Notes (Signed)
Patient seen in assisted living facility by clinical staff for oxygen testing.   Oxygen saturation at rest on room air: 94% Oxygen saturation during exertion on room air: 86% Oxygen saturation during exertion on 2L/ min O2 via [place]: 93%

## 2015-10-15 DIAGNOSIS — Z992 Dependence on renal dialysis: Secondary | ICD-10-CM | POA: Diagnosis not present

## 2015-10-15 DIAGNOSIS — R269 Unspecified abnormalities of gait and mobility: Secondary | ICD-10-CM | POA: Diagnosis not present

## 2015-10-15 DIAGNOSIS — F0390 Unspecified dementia without behavioral disturbance: Secondary | ICD-10-CM | POA: Diagnosis not present

## 2015-10-15 DIAGNOSIS — R2681 Unsteadiness on feet: Secondary | ICD-10-CM | POA: Diagnosis not present

## 2015-10-15 DIAGNOSIS — I509 Heart failure, unspecified: Secondary | ICD-10-CM | POA: Diagnosis not present

## 2015-10-15 DIAGNOSIS — J449 Chronic obstructive pulmonary disease, unspecified: Secondary | ICD-10-CM | POA: Diagnosis not present

## 2015-10-15 DIAGNOSIS — J9610 Chronic respiratory failure, unspecified whether with hypoxia or hypercapnia: Secondary | ICD-10-CM | POA: Diagnosis not present

## 2015-10-15 DIAGNOSIS — R296 Repeated falls: Secondary | ICD-10-CM | POA: Diagnosis not present

## 2015-10-15 DIAGNOSIS — N186 End stage renal disease: Secondary | ICD-10-CM | POA: Diagnosis not present

## 2015-10-15 DIAGNOSIS — I5032 Chronic diastolic (congestive) heart failure: Secondary | ICD-10-CM | POA: Diagnosis not present

## 2015-10-15 DIAGNOSIS — R0602 Shortness of breath: Secondary | ICD-10-CM | POA: Diagnosis not present

## 2015-10-16 DIAGNOSIS — F0390 Unspecified dementia without behavioral disturbance: Secondary | ICD-10-CM | POA: Diagnosis not present

## 2015-10-17 ENCOUNTER — Other Ambulatory Visit: Payer: Self-pay | Admitting: *Deleted

## 2015-10-17 DIAGNOSIS — F0390 Unspecified dementia without behavioral disturbance: Secondary | ICD-10-CM | POA: Diagnosis not present

## 2015-10-17 DIAGNOSIS — J441 Chronic obstructive pulmonary disease with (acute) exacerbation: Secondary | ICD-10-CM | POA: Diagnosis not present

## 2015-10-17 DIAGNOSIS — D649 Anemia, unspecified: Secondary | ICD-10-CM | POA: Diagnosis not present

## 2015-10-17 DIAGNOSIS — E1122 Type 2 diabetes mellitus with diabetic chronic kidney disease: Secondary | ICD-10-CM | POA: Diagnosis not present

## 2015-10-17 DIAGNOSIS — J9621 Acute and chronic respiratory failure with hypoxia: Secondary | ICD-10-CM | POA: Diagnosis not present

## 2015-10-17 DIAGNOSIS — Z992 Dependence on renal dialysis: Secondary | ICD-10-CM | POA: Diagnosis not present

## 2015-10-17 DIAGNOSIS — N186 End stage renal disease: Secondary | ICD-10-CM | POA: Diagnosis not present

## 2015-10-17 DIAGNOSIS — I5033 Acute on chronic diastolic (congestive) heart failure: Secondary | ICD-10-CM | POA: Diagnosis not present

## 2015-10-17 DIAGNOSIS — J209 Acute bronchitis, unspecified: Secondary | ICD-10-CM | POA: Diagnosis not present

## 2015-10-17 DIAGNOSIS — I130 Hypertensive heart and chronic kidney disease with heart failure and stage 1 through stage 4 chronic kidney disease, or unspecified chronic kidney disease: Secondary | ICD-10-CM | POA: Diagnosis not present

## 2015-10-17 MED ORDER — BISACODYL 10 MG RE SUPP: RECTAL | 2 refills | Status: DC

## 2015-10-17 NOTE — Telephone Encounter (Signed)
Received fax requesting refill on Dulcolax.   Prescription sent to pharmacy.

## 2015-10-18 DIAGNOSIS — F0390 Unspecified dementia without behavioral disturbance: Secondary | ICD-10-CM | POA: Diagnosis not present

## 2015-10-19 DIAGNOSIS — F0390 Unspecified dementia without behavioral disturbance: Secondary | ICD-10-CM | POA: Diagnosis not present

## 2015-10-20 DIAGNOSIS — Z992 Dependence on renal dialysis: Secondary | ICD-10-CM | POA: Diagnosis not present

## 2015-10-20 DIAGNOSIS — F0390 Unspecified dementia without behavioral disturbance: Secondary | ICD-10-CM | POA: Diagnosis not present

## 2015-10-20 DIAGNOSIS — N186 End stage renal disease: Secondary | ICD-10-CM | POA: Diagnosis not present

## 2015-10-21 DIAGNOSIS — I5033 Acute on chronic diastolic (congestive) heart failure: Secondary | ICD-10-CM | POA: Diagnosis not present

## 2015-10-21 DIAGNOSIS — E1122 Type 2 diabetes mellitus with diabetic chronic kidney disease: Secondary | ICD-10-CM | POA: Diagnosis not present

## 2015-10-21 DIAGNOSIS — J9621 Acute and chronic respiratory failure with hypoxia: Secondary | ICD-10-CM | POA: Diagnosis not present

## 2015-10-21 DIAGNOSIS — N186 End stage renal disease: Secondary | ICD-10-CM | POA: Diagnosis not present

## 2015-10-21 DIAGNOSIS — I130 Hypertensive heart and chronic kidney disease with heart failure and stage 1 through stage 4 chronic kidney disease, or unspecified chronic kidney disease: Secondary | ICD-10-CM | POA: Diagnosis not present

## 2015-10-21 DIAGNOSIS — J209 Acute bronchitis, unspecified: Secondary | ICD-10-CM | POA: Diagnosis not present

## 2015-10-21 DIAGNOSIS — Z992 Dependence on renal dialysis: Secondary | ICD-10-CM | POA: Diagnosis not present

## 2015-10-21 DIAGNOSIS — D649 Anemia, unspecified: Secondary | ICD-10-CM | POA: Diagnosis not present

## 2015-10-21 DIAGNOSIS — J441 Chronic obstructive pulmonary disease with (acute) exacerbation: Secondary | ICD-10-CM | POA: Diagnosis not present

## 2015-10-21 DIAGNOSIS — F0390 Unspecified dementia without behavioral disturbance: Secondary | ICD-10-CM | POA: Diagnosis not present

## 2015-10-22 ENCOUNTER — Other Ambulatory Visit: Payer: Self-pay | Admitting: Family Medicine

## 2015-10-22 DIAGNOSIS — F0390 Unspecified dementia without behavioral disturbance: Secondary | ICD-10-CM | POA: Diagnosis not present

## 2015-10-22 DIAGNOSIS — Z992 Dependence on renal dialysis: Secondary | ICD-10-CM | POA: Diagnosis not present

## 2015-10-22 DIAGNOSIS — N186 End stage renal disease: Secondary | ICD-10-CM | POA: Diagnosis not present

## 2015-10-23 DIAGNOSIS — F0390 Unspecified dementia without behavioral disturbance: Secondary | ICD-10-CM | POA: Diagnosis not present

## 2015-10-24 DIAGNOSIS — E1122 Type 2 diabetes mellitus with diabetic chronic kidney disease: Secondary | ICD-10-CM | POA: Diagnosis not present

## 2015-10-24 DIAGNOSIS — Z992 Dependence on renal dialysis: Secondary | ICD-10-CM | POA: Diagnosis not present

## 2015-10-24 DIAGNOSIS — N186 End stage renal disease: Secondary | ICD-10-CM | POA: Diagnosis not present

## 2015-10-24 DIAGNOSIS — J9621 Acute and chronic respiratory failure with hypoxia: Secondary | ICD-10-CM | POA: Diagnosis not present

## 2015-10-24 DIAGNOSIS — I130 Hypertensive heart and chronic kidney disease with heart failure and stage 1 through stage 4 chronic kidney disease, or unspecified chronic kidney disease: Secondary | ICD-10-CM | POA: Diagnosis not present

## 2015-10-24 DIAGNOSIS — J209 Acute bronchitis, unspecified: Secondary | ICD-10-CM | POA: Diagnosis not present

## 2015-10-24 DIAGNOSIS — J441 Chronic obstructive pulmonary disease with (acute) exacerbation: Secondary | ICD-10-CM | POA: Diagnosis not present

## 2015-10-24 DIAGNOSIS — I5033 Acute on chronic diastolic (congestive) heart failure: Secondary | ICD-10-CM | POA: Diagnosis not present

## 2015-10-24 DIAGNOSIS — D649 Anemia, unspecified: Secondary | ICD-10-CM | POA: Diagnosis not present

## 2015-10-24 DIAGNOSIS — F0390 Unspecified dementia without behavioral disturbance: Secondary | ICD-10-CM | POA: Diagnosis not present

## 2015-10-25 DIAGNOSIS — F0390 Unspecified dementia without behavioral disturbance: Secondary | ICD-10-CM | POA: Diagnosis not present

## 2015-10-26 DIAGNOSIS — F0390 Unspecified dementia without behavioral disturbance: Secondary | ICD-10-CM | POA: Diagnosis not present

## 2015-10-27 DIAGNOSIS — N186 End stage renal disease: Secondary | ICD-10-CM | POA: Diagnosis not present

## 2015-10-27 DIAGNOSIS — F0390 Unspecified dementia without behavioral disturbance: Secondary | ICD-10-CM | POA: Diagnosis not present

## 2015-10-27 DIAGNOSIS — Z992 Dependence on renal dialysis: Secondary | ICD-10-CM | POA: Diagnosis not present

## 2015-10-28 DIAGNOSIS — E113292 Type 2 diabetes mellitus with mild nonproliferative diabetic retinopathy without macular edema, left eye: Secondary | ICD-10-CM | POA: Diagnosis not present

## 2015-10-28 DIAGNOSIS — E113211 Type 2 diabetes mellitus with mild nonproliferative diabetic retinopathy with macular edema, right eye: Secondary | ICD-10-CM | POA: Diagnosis not present

## 2015-10-28 DIAGNOSIS — H43813 Vitreous degeneration, bilateral: Secondary | ICD-10-CM | POA: Diagnosis not present

## 2015-10-28 DIAGNOSIS — H348122 Central retinal vein occlusion, left eye, stable: Secondary | ICD-10-CM | POA: Diagnosis not present

## 2015-10-28 DIAGNOSIS — F0390 Unspecified dementia without behavioral disturbance: Secondary | ICD-10-CM | POA: Diagnosis not present

## 2015-10-29 ENCOUNTER — Telehealth: Payer: Self-pay | Admitting: Family Medicine

## 2015-10-29 DIAGNOSIS — E1122 Type 2 diabetes mellitus with diabetic chronic kidney disease: Secondary | ICD-10-CM | POA: Diagnosis not present

## 2015-10-29 DIAGNOSIS — I5033 Acute on chronic diastolic (congestive) heart failure: Secondary | ICD-10-CM | POA: Diagnosis not present

## 2015-10-29 DIAGNOSIS — I130 Hypertensive heart and chronic kidney disease with heart failure and stage 1 through stage 4 chronic kidney disease, or unspecified chronic kidney disease: Secondary | ICD-10-CM | POA: Diagnosis not present

## 2015-10-29 DIAGNOSIS — Z992 Dependence on renal dialysis: Secondary | ICD-10-CM | POA: Diagnosis not present

## 2015-10-29 DIAGNOSIS — D649 Anemia, unspecified: Secondary | ICD-10-CM | POA: Diagnosis not present

## 2015-10-29 DIAGNOSIS — J9621 Acute and chronic respiratory failure with hypoxia: Secondary | ICD-10-CM | POA: Diagnosis not present

## 2015-10-29 DIAGNOSIS — F0390 Unspecified dementia without behavioral disturbance: Secondary | ICD-10-CM | POA: Diagnosis not present

## 2015-10-29 DIAGNOSIS — N186 End stage renal disease: Secondary | ICD-10-CM | POA: Diagnosis not present

## 2015-10-29 DIAGNOSIS — J209 Acute bronchitis, unspecified: Secondary | ICD-10-CM | POA: Diagnosis not present

## 2015-10-29 DIAGNOSIS — J441 Chronic obstructive pulmonary disease with (acute) exacerbation: Secondary | ICD-10-CM | POA: Diagnosis not present

## 2015-10-29 NOTE — Telephone Encounter (Signed)
Call placed to [facility].   Spoke with Seth Bake.   States that patient is not in any acute distress.   Advised if patient shows difficulty breathing, send to ER.   If not, send to dialysis in AM.   Verbalized understanding.

## 2015-10-29 NOTE — Telephone Encounter (Signed)
Call placed to [place] with [facility].   Advised that patient weight on 10/23/2015 noted at 188. Patient weight on 10/29/2015 noted at 193. States that patient has sone increased SOB upon exertion, and some slight wheezing in B upper lobes.   MD please advise.

## 2015-10-29 NOTE — Telephone Encounter (Signed)
She is likely fluid overloaded and needs dialysis, if she is very SOB, needs to go to ER tonight I cant remember her HD days, if she has HD tomorrow and is otherwise stable she can just get HD tomorrow

## 2015-10-29 NOTE — Telephone Encounter (Signed)
nicole from [facility] calling to speak to you regarding this patient  [phone]

## 2015-10-30 DIAGNOSIS — F0390 Unspecified dementia without behavioral disturbance: Secondary | ICD-10-CM | POA: Diagnosis not present

## 2015-10-31 DIAGNOSIS — F0390 Unspecified dementia without behavioral disturbance: Secondary | ICD-10-CM | POA: Diagnosis not present

## 2015-10-31 DIAGNOSIS — Z992 Dependence on renal dialysis: Secondary | ICD-10-CM | POA: Diagnosis not present

## 2015-10-31 DIAGNOSIS — N186 End stage renal disease: Secondary | ICD-10-CM | POA: Diagnosis not present

## 2015-11-01 DIAGNOSIS — F0390 Unspecified dementia without behavioral disturbance: Secondary | ICD-10-CM | POA: Diagnosis not present

## 2015-11-02 DIAGNOSIS — F0390 Unspecified dementia without behavioral disturbance: Secondary | ICD-10-CM | POA: Diagnosis not present

## 2015-11-03 DIAGNOSIS — N186 End stage renal disease: Secondary | ICD-10-CM | POA: Diagnosis not present

## 2015-11-03 DIAGNOSIS — F0390 Unspecified dementia without behavioral disturbance: Secondary | ICD-10-CM | POA: Diagnosis not present

## 2015-11-03 DIAGNOSIS — Z992 Dependence on renal dialysis: Secondary | ICD-10-CM | POA: Diagnosis not present

## 2015-11-04 DIAGNOSIS — F0390 Unspecified dementia without behavioral disturbance: Secondary | ICD-10-CM | POA: Diagnosis not present

## 2015-11-05 DIAGNOSIS — Z992 Dependence on renal dialysis: Secondary | ICD-10-CM | POA: Diagnosis not present

## 2015-11-05 DIAGNOSIS — N186 End stage renal disease: Secondary | ICD-10-CM | POA: Diagnosis not present

## 2015-11-05 DIAGNOSIS — F0390 Unspecified dementia without behavioral disturbance: Secondary | ICD-10-CM | POA: Diagnosis not present

## 2015-11-06 DIAGNOSIS — F0390 Unspecified dementia without behavioral disturbance: Secondary | ICD-10-CM | POA: Diagnosis not present

## 2015-11-07 DIAGNOSIS — Z992 Dependence on renal dialysis: Secondary | ICD-10-CM | POA: Diagnosis not present

## 2015-11-07 DIAGNOSIS — N186 End stage renal disease: Secondary | ICD-10-CM | POA: Diagnosis not present

## 2015-11-07 DIAGNOSIS — F0390 Unspecified dementia without behavioral disturbance: Secondary | ICD-10-CM | POA: Diagnosis not present

## 2015-11-08 DIAGNOSIS — F0390 Unspecified dementia without behavioral disturbance: Secondary | ICD-10-CM | POA: Diagnosis not present

## 2015-11-09 DIAGNOSIS — F0390 Unspecified dementia without behavioral disturbance: Secondary | ICD-10-CM | POA: Diagnosis not present

## 2015-11-10 DIAGNOSIS — N186 End stage renal disease: Secondary | ICD-10-CM | POA: Diagnosis not present

## 2015-11-10 DIAGNOSIS — Z992 Dependence on renal dialysis: Secondary | ICD-10-CM | POA: Diagnosis not present

## 2015-11-10 DIAGNOSIS — F0390 Unspecified dementia without behavioral disturbance: Secondary | ICD-10-CM | POA: Diagnosis not present

## 2015-11-11 DIAGNOSIS — Z992 Dependence on renal dialysis: Secondary | ICD-10-CM | POA: Diagnosis not present

## 2015-11-11 DIAGNOSIS — N186 End stage renal disease: Secondary | ICD-10-CM | POA: Diagnosis not present

## 2015-11-11 DIAGNOSIS — F0390 Unspecified dementia without behavioral disturbance: Secondary | ICD-10-CM | POA: Diagnosis not present

## 2015-11-12 DIAGNOSIS — N186 End stage renal disease: Secondary | ICD-10-CM | POA: Diagnosis not present

## 2015-11-12 DIAGNOSIS — F0390 Unspecified dementia without behavioral disturbance: Secondary | ICD-10-CM | POA: Diagnosis not present

## 2015-11-12 DIAGNOSIS — Z992 Dependence on renal dialysis: Secondary | ICD-10-CM | POA: Diagnosis not present

## 2015-11-13 DIAGNOSIS — F0390 Unspecified dementia without behavioral disturbance: Secondary | ICD-10-CM | POA: Diagnosis not present

## 2015-11-14 DIAGNOSIS — F0390 Unspecified dementia without behavioral disturbance: Secondary | ICD-10-CM | POA: Diagnosis not present

## 2015-11-14 DIAGNOSIS — Z992 Dependence on renal dialysis: Secondary | ICD-10-CM | POA: Diagnosis not present

## 2015-11-14 DIAGNOSIS — N186 End stage renal disease: Secondary | ICD-10-CM | POA: Diagnosis not present

## 2015-11-15 DIAGNOSIS — R269 Unspecified abnormalities of gait and mobility: Secondary | ICD-10-CM | POA: Diagnosis not present

## 2015-11-15 DIAGNOSIS — I509 Heart failure, unspecified: Secondary | ICD-10-CM | POA: Diagnosis not present

## 2015-11-15 DIAGNOSIS — J9610 Chronic respiratory failure, unspecified whether with hypoxia or hypercapnia: Secondary | ICD-10-CM | POA: Diagnosis not present

## 2015-11-15 DIAGNOSIS — R0602 Shortness of breath: Secondary | ICD-10-CM | POA: Diagnosis not present

## 2015-11-15 DIAGNOSIS — R296 Repeated falls: Secondary | ICD-10-CM | POA: Diagnosis not present

## 2015-11-15 DIAGNOSIS — F0390 Unspecified dementia without behavioral disturbance: Secondary | ICD-10-CM | POA: Diagnosis not present

## 2015-11-15 DIAGNOSIS — R2681 Unsteadiness on feet: Secondary | ICD-10-CM | POA: Diagnosis not present

## 2015-11-15 DIAGNOSIS — J449 Chronic obstructive pulmonary disease, unspecified: Secondary | ICD-10-CM | POA: Diagnosis not present

## 2015-11-15 DIAGNOSIS — I5032 Chronic diastolic (congestive) heart failure: Secondary | ICD-10-CM | POA: Diagnosis not present

## 2015-11-16 DIAGNOSIS — F0390 Unspecified dementia without behavioral disturbance: Secondary | ICD-10-CM | POA: Diagnosis not present

## 2015-11-17 ENCOUNTER — Other Ambulatory Visit: Payer: Self-pay | Admitting: Family Medicine

## 2015-11-17 DIAGNOSIS — Z992 Dependence on renal dialysis: Secondary | ICD-10-CM | POA: Diagnosis not present

## 2015-11-17 DIAGNOSIS — N186 End stage renal disease: Secondary | ICD-10-CM | POA: Diagnosis not present

## 2015-11-17 DIAGNOSIS — F0390 Unspecified dementia without behavioral disturbance: Secondary | ICD-10-CM | POA: Diagnosis not present

## 2015-11-17 NOTE — Telephone Encounter (Signed)
Medication refilled per protocol. 

## 2015-11-18 DIAGNOSIS — F0390 Unspecified dementia without behavioral disturbance: Secondary | ICD-10-CM | POA: Diagnosis not present

## 2015-11-19 DIAGNOSIS — F0390 Unspecified dementia without behavioral disturbance: Secondary | ICD-10-CM | POA: Diagnosis not present

## 2015-11-19 DIAGNOSIS — N186 End stage renal disease: Secondary | ICD-10-CM | POA: Diagnosis not present

## 2015-11-19 DIAGNOSIS — Z992 Dependence on renal dialysis: Secondary | ICD-10-CM | POA: Diagnosis not present

## 2015-11-20 DIAGNOSIS — F0390 Unspecified dementia without behavioral disturbance: Secondary | ICD-10-CM | POA: Diagnosis not present

## 2015-11-21 ENCOUNTER — Telehealth: Payer: Self-pay | Admitting: Family Medicine

## 2015-11-21 DIAGNOSIS — N186 End stage renal disease: Secondary | ICD-10-CM | POA: Diagnosis not present

## 2015-11-21 DIAGNOSIS — Z992 Dependence on renal dialysis: Secondary | ICD-10-CM | POA: Diagnosis not present

## 2015-11-21 DIAGNOSIS — F0390 Unspecified dementia without behavioral disturbance: Secondary | ICD-10-CM | POA: Diagnosis not present

## 2015-11-21 NOTE — Telephone Encounter (Signed)
[employer] ref Josem Kaufmann [phone] for Dr Bronson Ing, cardiology, for 6 visits 11/25/15 - 05/23/16  Dx E03.9, I10, E78.5, N18.6 and Z99.2

## 2015-11-22 DIAGNOSIS — F0390 Unspecified dementia without behavioral disturbance: Secondary | ICD-10-CM | POA: Diagnosis not present

## 2015-11-23 DIAGNOSIS — F0390 Unspecified dementia without behavioral disturbance: Secondary | ICD-10-CM | POA: Diagnosis not present

## 2015-11-24 ENCOUNTER — Telehealth: Payer: Self-pay | Admitting: Family Medicine

## 2015-11-24 DIAGNOSIS — N186 End stage renal disease: Secondary | ICD-10-CM | POA: Diagnosis not present

## 2015-11-24 DIAGNOSIS — F0390 Unspecified dementia without behavioral disturbance: Secondary | ICD-10-CM | POA: Diagnosis not present

## 2015-11-24 DIAGNOSIS — Z992 Dependence on renal dialysis: Secondary | ICD-10-CM | POA: Diagnosis not present

## 2015-11-24 NOTE — Telephone Encounter (Signed)
Call placed to facility.   [name], [facility] SN is at another facility today. States that she has concerns about patient lung sounds.   Seth Bake states that patient is using PRN O2 every day. Reports that she has neb tx for SOB. Seth Bake does not know what [name] thought she heard in lungs.   Patient is currently at dialysis. Advised that if patient returns to home with SOB or unusual lung sounds to send to ED for eval. If patient is stable, schedule OV .

## 2015-11-24 NOTE — Telephone Encounter (Signed)
Horris Latino from [facility] calling to discuss this patient and some of her medications  [phone]

## 2015-11-25 ENCOUNTER — Ambulatory Visit: Payer: Commercial Managed Care - HMO | Admitting: Cardiovascular Disease

## 2015-11-25 DIAGNOSIS — F0390 Unspecified dementia without behavioral disturbance: Secondary | ICD-10-CM | POA: Diagnosis not present

## 2015-11-26 ENCOUNTER — Encounter (HOSPITAL_COMMUNITY): Payer: Self-pay

## 2015-11-26 ENCOUNTER — Emergency Department (HOSPITAL_COMMUNITY): Payer: Commercial Managed Care - HMO

## 2015-11-26 ENCOUNTER — Emergency Department (HOSPITAL_COMMUNITY)
Admission: EM | Admit: 2015-11-26 | Discharge: 2015-11-26 | Disposition: A | Discharge status: Home / Self Care | Payer: Commercial Managed Care - HMO | Attending: Emergency Medicine | Admitting: Emergency Medicine

## 2015-11-26 ENCOUNTER — Other Ambulatory Visit: Payer: Self-pay | Admitting: Family Medicine

## 2015-11-26 DIAGNOSIS — I5032 Chronic diastolic (congestive) heart failure: Secondary | ICD-10-CM | POA: Diagnosis not present

## 2015-11-26 DIAGNOSIS — E039 Hypothyroidism, unspecified: Secondary | ICD-10-CM | POA: Insufficient documentation

## 2015-11-26 DIAGNOSIS — Z85028 Personal history of other malignant neoplasm of stomach: Secondary | ICD-10-CM | POA: Diagnosis not present

## 2015-11-26 DIAGNOSIS — N186 End stage renal disease: Secondary | ICD-10-CM | POA: Insufficient documentation

## 2015-11-26 DIAGNOSIS — E1122 Type 2 diabetes mellitus with diabetic chronic kidney disease: Secondary | ICD-10-CM | POA: Insufficient documentation

## 2015-11-26 DIAGNOSIS — Z79899 Other long term (current) drug therapy: Secondary | ICD-10-CM | POA: Diagnosis not present

## 2015-11-26 DIAGNOSIS — R55 Syncope and collapse: Secondary | ICD-10-CM | POA: Insufficient documentation

## 2015-11-26 DIAGNOSIS — R404 Transient alteration of awareness: Secondary | ICD-10-CM | POA: Diagnosis not present

## 2015-11-26 DIAGNOSIS — Z7982 Long term (current) use of aspirin: Secondary | ICD-10-CM | POA: Insufficient documentation

## 2015-11-26 DIAGNOSIS — R531 Weakness: Secondary | ICD-10-CM | POA: Insufficient documentation

## 2015-11-26 DIAGNOSIS — F0390 Unspecified dementia without behavioral disturbance: Secondary | ICD-10-CM | POA: Diagnosis not present

## 2015-11-26 DIAGNOSIS — R569 Unspecified convulsions: Secondary | ICD-10-CM | POA: Diagnosis not present

## 2015-11-26 DIAGNOSIS — Z992 Dependence on renal dialysis: Secondary | ICD-10-CM | POA: Diagnosis not present

## 2015-11-26 DIAGNOSIS — I132 Hypertensive heart and chronic kidney disease with heart failure and with stage 5 chronic kidney disease, or end stage renal disease: Secondary | ICD-10-CM | POA: Diagnosis not present

## 2015-11-26 LAB — CBC WITH DIFFERENTIAL/PLATELET
BASOS PCT: 0 %
Basophils Absolute: 0 10*3/uL (ref 0.0–0.1)
EOS ABS: 0 10*3/uL (ref 0.0–0.7)
EOS PCT: 0 %
HCT: 30.9 % — ABNORMAL LOW (ref 36.0–46.0)
HEMOGLOBIN: 10 g/dL — AB (ref 12.0–15.0)
Lymphocytes Relative: 16 %
Lymphs Abs: 1.4 10*3/uL (ref 0.7–4.0)
MCH: 25.3 pg — ABNORMAL LOW (ref 26.0–34.0)
MCHC: 32.4 g/dL (ref 30.0–36.0)
MCV: 78.2 fL (ref 78.0–100.0)
Monocytes Absolute: 1 10*3/uL (ref 0.1–1.0)
Monocytes Relative: 11 %
NEUTROS PCT: 73 %
Neutro Abs: 6.6 10*3/uL (ref 1.7–7.7)
PLATELETS: 199 10*3/uL (ref 150–400)
RBC: 3.95 MIL/uL (ref 3.87–5.11)
RDW: 14.5 % (ref 11.5–15.5)
WBC: 9 10*3/uL (ref 4.0–10.5)

## 2015-11-26 LAB — COMPREHENSIVE METABOLIC PANEL
ALBUMIN: 3.5 g/dL (ref 3.5–5.0)
ALK PHOS: 89 U/L (ref 38–126)
ALT: 12 U/L — AB (ref 14–54)
ANION GAP: 9 (ref 5–15)
AST: 17 U/L (ref 15–41)
BUN: 34 mg/dL — ABNORMAL HIGH (ref 6–20)
CALCIUM: 8.9 mg/dL (ref 8.9–10.3)
CHLORIDE: 94 mmol/L — AB (ref 101–111)
CO2: 29 mmol/L (ref 22–32)
Creatinine, Ser: 5.43 mg/dL — ABNORMAL HIGH (ref 0.44–1.00)
GFR calc non Af Amer: 6 mL/min — ABNORMAL LOW (ref >60)
GFR, EST AFRICAN AMERICAN: 7 mL/min — AB (ref >60)
GLUCOSE: 151 mg/dL — AB (ref 65–99)
Potassium: 4.1 mmol/L (ref 3.5–5.1)
SODIUM: 132 mmol/L — AB (ref 135–145)
Total Bilirubin: 0.5 mg/dL (ref 0.3–1.2)
Total Protein: 7 g/dL (ref 6.5–8.1)

## 2015-11-26 NOTE — ED Provider Notes (Signed)
[facility] DEPT Provider Note   CSN: [id number] Arrival date & time: 11/26/15  1402     History   Chief Complaint Chief Complaint  Patient presents with  . Near Syncope    HPI [name] is a 82 y.o. female.  Patient was at the dialysis center and had an episode where she was, staring and then having some twitching without contrast weak   The history is provided by the patient and a caregiver. No language interpreter was used.  Near Syncope  This is a new problem. The current episode started 3 to 5 hours ago. The problem occurs rarely. The problem has been resolved. Pertinent negatives include no chest pain, no abdominal pain and no headaches. Nothing aggravates the symptoms. Nothing relieves the symptoms. She has tried nothing for the symptoms. The treatment provided significant relief.    Past Medical History:  Diagnosis Date  . Anemia   . Anemia in CKD (chronic kidney disease)   . Anxiety   . Arthritis    gout, right shoulder  . Cancer [facility])    ?stomach cancer   . Cataracts, both eyes   . CHF (congestive heart failure) ([place])   . CKD (chronic kidney disease), stage V ([place])    on HD on M/W/F  . Depression   . Diabetes mellitus, type 2 ([place])   . GERD (gastroesophageal reflux disease)   . Gynecologic cancer   . Hyperlipidemia   . Hypertension   . IBS (irritable bowel syndrome)    with costipation  . Intraoperative hemorrhage and hematoma of a digestive system organ or structure complicating a digestive system procedure   . Overactive bladder   . Pneumonia    2 times in 2016  . SOB (shortness of breath)    nml spirometry 09/05/06  . Stroke [facility])    residual right side weakness and pain  . SVT (supraventricular tachycardia) ([place])   . Syncope and collapse   . Thyroid disease     Patient Active Problem List   Diagnosis Date Noted  . Chronic diastolic heart failure ([place]) 10/07/2015  . Fluid overload 10/02/2015  . Hyperglycemia 10/02/2015  .  D-dimer, elevated   . Depression   . Pseudoaneurysm of arteriovenous dialysis fistula ([place]) 05/29/2015  . Traumatic hematoma of scalp 02/12/2014  . Decreased hearing of both ears 02/12/2014  . At high risk for falls 02/12/2014  . Leg swelling 09/18/2013  . OA (osteoarthritis) of knee 09/18/2013  . MDD (major depressive disorder) ([place]) 04/05/2013  . Mechanical complication of other vascular device, implant, and graft 11/14/2012  . Pain in limb-Left arm 07/11/2012  . Hypothyroidism 06/08/2012  . Heartburn 03/23/2012  . Gait instability 01/13/2012  . Neuropathy of hand 01/13/2012  . Breast mass, right 07/11/2011  . Hemorrhoid 03/14/2011  . ESRD (end stage renal disease) on dialysis ([place]) 02/12/2011  . Bradycardia 02/01/2011  . Dyspnea 01/17/2011  . Chronic respiratory failure ([place]) 01/13/2011  . Constipation 12/09/2010  . Insomnia 10/14/2010  . Diabetes mellitus with renal complications ([place]) 26/83/4196  . ALLERGIC RHINITIS 04/03/2008  . 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 DEPENDENT EDEMA, LEGS, BILATERAL 11/17/2006  . HYPERLIPIDEMIA 10/10/2006  . ANEMIA OF RENAL FAILURE 10/10/2006  . ANXIETY 09/05/2006  . CATARACT NOS 09/05/2006  . IBS 09/05/2006  . ARTHRITIS 09/05/2006    Past Surgical History:  Procedure Laterality Date  . AV FISTULA PLACEMENT, BRACHIOCEPHALIC  22/29/7989   left arm  by Dr. Bridgett Larsson  . COLONOSCOPY    . FISTULA SUPERFICIALIZATION Left 06/10/2015   Procedure: LEFT UPPER ARM BRACHIOCEPHALIC FISTULA PSEUDOANEURYSM PLICATION;  Surgeon: Conrad Corcovado, MD;  Location: [facility];  Service: Vascular;  Laterality: Left;  . PERIPHERAL VASCULAR CATHETERIZATION Left 05/15/2015   Procedure: Fistulagram;  Surgeon: Conrad , MD;  Location: [facility];  Service: Cardiovascular;  Laterality: Left;  . TEE WITHOUT CARDIOVERSION  2011   2/2 Strep Bovis infection  . VESICOVAGINAL FISTULA CLOSURE W/ TAH      OB History    No  data available       Home Medications    Prior to Admission medications   Medication Sig Start Date End Date Taking? Authorizing Provider  amLODipine (NORVASC) 10 MG tablet TAKE 1 TABLET BY MOUTH EVERY MORNING. 11/17/15  Yes Alycia Rossetti, MD  ASPIRIN LOW DOSE 81 MG EC tablet TAKE (1) TABLET BY MOUTH ONCE DAILY. 11/17/15  Yes Alycia Rossetti, MD  citalopram (CELEXA) 20 MG tablet TAKE (1) TABLET BY MOUTH ONCE IN THE MORNING. 11/17/15  Yes Alycia Rossetti, MD  DOK 100 MG capsule TAKE 1 CAPSULE BY MOUTH TWICE DAILY. 11/17/15  Yes Alycia Rossetti, MD  isosorbide mononitrate (IMDUR) 60 MG 24 hr tablet TAKE (1) TABLET BY MOUTH ONCE DAILY. 11/17/15  Yes Alycia Rossetti, MD  levothyroxine (SYNTHROID, LEVOTHROID) 25 MCG tablet TAKE (1) TABLET BY MOUTH ONCE DAILY BEFORE BREAKFAST. 11/17/15  Yes Alycia Rossetti, MD  lidocaine-prilocaine (EMLA) cream Apply topically as needed. Apply to L upper arm (1) hour prior to dialysis on MWF. Wrap with plastic wrap after applying. 02/28/15  Yes Alycia Rossetti, MD  mirtazapine (REMERON) 15 MG tablet TAKE 1/2 TABLET (7.5MG ) BY MOUTH AT BEDTIME FOR MOOD/APPETITE. 11/17/15  Yes Alycia Rossetti, MD  Multiple Vitamin (DAILY-VITE) TABS TAKE 1 TABLET BY MOUTH ONCE DAILY. (ON DAY OF DIALYSIS, TAKE AFTER DIALYSIS) 11/17/15  Yes Alycia Rossetti, MD  ranitidine (ZANTAC) 150 MG tablet TAKE 1 TABLET BY MOUTH AT BEDTIME. 11/17/15  Yes Alycia Rossetti, MD  SENSIPAR 30 MG tablet TAKE 1 TABLET BY MOUTH ONCE DAILY AFTER THE EVENING MEAL 08/19/15  Yes Alycia Rossetti, MD  sevelamer (RENVELA) 800 MG tablet Take 800-2,400 mg by mouth 2 (two) times daily. Take 1 tablet twice a day, 3 tablets with meals   Yes Historical Provider, MD  traMADol (ULTRAM) 50 MG tablet Take 50 mg by mouth every 12 (twelve) hours as needed.   Yes Historical Provider, MD  Travoprost, BAK Free, (TRAVATAN) 0.004 % SOLN ophthalmic solution Place 1 drop into both eyes at bedtime.   Yes Historical Provider, MD    albuterol (PROVENTIL HFA;VENTOLIN HFA) 108 (90 Base) MCG/ACT inhaler Inhale 2 puffs into the lungs every 4 (four) hours as needed for wheezing or shortness of breath.    Historical Provider, MD  albuterol (PROVENTIL) (2.5 MG/3ML) 0.083% nebulizer solution USE 1 VIAL IN NEBULIZER 3 TIMES DAILY AS NEEDED FOR WHEEZING/SHORTNESS OF BREATH. 10/01/15   Alycia Rossetti, MD  bisacodyl (DULCOLAX) 10 MG suppository 1 suppository rectally  Daily every 3 days as needed for constipation 10/17/15   Alycia Rossetti, MD  etodolac (LODINE) 400 MG tablet Take 400 mg by mouth every 12 (twelve) hours as needed for mild pain.    Historical Provider, MD  ipratropium (ATROVENT) 0.02 % nebulizer solution USE 1 VIAL IN NEBULIZER 3 TIMES DAILY AS NEEDED FOR WHEEZING/SHORTNESS OF BREATH. 10/01/15  Alycia Rossetti, MD  Lancets MISC Use as directed to monitor FSBS 1x daily x2 weeks, then 1x daily PRN S/Sx hypoglycemia. Dx: E11.9. 10/09/15   Alycia Rossetti, MD  nitroGLYCERIN (NITROSTAT) 0.4 MG SL tablet DISSOLVE 1 TABLET UNDER THE TONGUE EVERY 5 MINUTES X2 DOSES THEN TO ER 06/06/15   Alycia Rossetti, MD  OXYGEN Inhale 2 L into the lungs daily as needed (shortness of breath).    Historical Provider, MD  polyethylene glycol powder (MIRALAX) powder Take 17 g by mouth daily. Hold for diarrhea 02/28/15   Alycia Rossetti, MD  Q-NOL 325 MG tablet TAKE (2) TABLETS FOUR TIMES DAILY AS NEEDED FOR PAIN. -MAY MAKE DROWSY - 09/04/15   Alycia Rossetti, MD    Family History No family history on file.  Social History Social History  Substance Use Topics  . Smoking status: Never Smoker  . Smokeless tobacco: Never Used  . Alcohol use No     Allergies   Patient has no known allergies.   Review of Systems Review of Systems  Constitutional: Negative for appetite change and fatigue.  HENT: Negative for congestion, ear discharge and sinus pressure.   Eyes: Negative for discharge.  Respiratory: Negative for cough.   Cardiovascular:  Positive for near-syncope. Negative for chest pain.  Gastrointestinal: Negative for abdominal pain and diarrhea.  Genitourinary: Negative for frequency and hematuria.  Musculoskeletal: Negative for back pain.  Skin: Negative for rash.  Neurological: Positive for weakness. Negative for headaches.  Psychiatric/Behavioral: Negative for hallucinations.     Physical Exam Updated Vital Signs BP 159/88 (BP Location: Right Arm)   Pulse 64   Temp 98.8 F (37.1 C) (Oral)   Resp 14   Ht 5\' 5"  (1.651 m)   Wt 184 lb (83.5 kg)   LMP 12/24/2010   SpO2 99%   BMI 30.62 kg/m   Physical Exam  Constitutional: She is oriented to person, place, and time. She appears well-developed.  HENT:  Head: Normocephalic.  Eyes: Conjunctivae and EOM are normal. No scleral icterus.  Neck: Neck supple. No thyromegaly present.  Cardiovascular: Normal rate and regular rhythm.  Exam reveals no gallop and no friction rub.   No murmur heard. Pulmonary/Chest: No stridor. She has no wheezes. She has no rales. She exhibits no tenderness.  Abdominal: She exhibits no distension. There is no tenderness. There is no rebound.  Musculoskeletal: Normal range of motion. She exhibits no edema.  Lymphadenopathy:    She has no cervical adenopathy.  Neurological: She is oriented to person, place, and time. She exhibits normal muscle tone. Coordination normal.  Skin: No rash noted. No erythema.  Psychiatric: She has a normal mood and affect. Her behavior is normal.     ED Treatments / Results  Labs (all labs ordered are listed, but only abnormal results are displayed) Labs Reviewed  CBC WITH DIFFERENTIAL/PLATELET - Abnormal; Notable for the following:       Result Value   Hemoglobin 10.0 (*)    HCT 30.9 (*)    MCH 25.3 (*)    All other components within normal limits  COMPREHENSIVE METABOLIC PANEL - Abnormal; Notable for the following:    Sodium 132 (*)    Chloride 94 (*)    Glucose, Bld 151 (*)    BUN 34 (*)     Creatinine, Ser 5.43 (*)    ALT 12 (*)    GFR calc non Af Amer 6 (*)    GFR calc Af Wyvonnia Lora  7 (*)    All other components within normal limits    EKG  EKG Interpretation  Date/Time:  Wednesday November 26 2015 14:15:03 EST Ventricular Rate:  66 PR Interval:    QRS Duration: 87 QT Interval:  442 QTC Calculation: 464 R Axis:   33 Text Interpretation:  Sinus rhythm Borderline prolonged PR interval Probable left atrial enlargement Abnormal R-wave progression, early transition Probable LVH with secondary repol abnrm Confirmed by Dayami Taitt  MD, Kiyana Vazguez [id number]) on 11/26/2015 8:13:45 PM       Radiology Ct Head Wo Contrast  Result Date: 11/26/2015 CLINICAL DATA:  Seizure.  Dialysis patient EXAM: CT HEAD WITHOUT CONTRAST TECHNIQUE: Contiguous axial images were obtained from the base of the skull through the vertex without intravenous contrast. COMPARISON:  CT 12/09/2013 FINDINGS: Brain: Moderate atrophy. Chronic microvascular ischemic change in the white matter. Negative for acute infarct. Negative for acute hemorrhage or mass. Vascular: Atherosclerotic calcification diffusely. Negative for dense MCA Skull: Negative Sinuses/Orbits: Mucosal edema paranasal sinuses.  No orbital lesion. Other: None IMPRESSION: Atrophy and chronic microvascular ischemia.  No acute abnormality. Electronically Signed   By: Franchot Gallo M.D.   On: 11/26/2015 15:57    Procedures Procedures (including critical care time)  Medications Ordered in ED Medications - No data to display   Initial Impression / Assessment and Plan / ED Course  I have reviewed the triage vital signs and the nursing notes.  Pertinent labs & imaging results that were available during my care of the patient were reviewed by me and considered in my medical decision making (see chart for details).  Clinical Course     Labs unremarkable CT scan of head shows no acute changes. Patient has felt fine ever since she was in emergency department.  Patient either had a near syncopal episode in dialysis or a seizure. She is discharged home to be followed up with her PCP  Final Clinical Impressions(s) / ED Diagnoses   Final diagnoses:  Near syncope    New Prescriptions New Prescriptions   No medications on file     Milton Ferguson, MD 11/26/15 2023

## 2015-11-26 NOTE — ED Notes (Signed)
Went to check on pt she is upset that she is still here. Nurse Corene Cornea) came in and explained to pt what was going on that he is working on getting her back to the [facility].

## 2015-11-26 NOTE — Discharge Instructions (Signed)
Follow up with your family md next week for recheck °

## 2015-11-26 NOTE — ED Notes (Signed)
Pt states no one has called her children or told her when she could leave. nurse Corene Cornea) spoke to pt and explained he had spoke to her daughter zina and that she would be by to check on her when she leaves church.

## 2015-11-26 NOTE — ED Triage Notes (Signed)
Ems reports pt was 46min from finishing her dialysis treatment and she c/o "not feeling good."  Staff told ems pt stared off and had some twitching movements that lasted approx 2 min.  Pt presently alert and oriented.  Pt denies any pain.

## 2015-11-26 NOTE — ED Notes (Signed)
Daughter does not answer cell phone.  Staff person at [facility]) in [place], [place] said they would call me back concerning pt transportation back to facility.

## 2015-11-26 NOTE — ED Notes (Signed)
Pt daughter Vidal Schwalbe) notified of pt's condition.  Daughter says she is at [facility] and will come see her tonight.

## 2015-11-26 NOTE — ED Notes (Signed)
Daughter (Zina) called back and says she cannot take her mother back to [facility]. Daughter says she will contact [facility] to see if they will transport pt.

## 2015-11-27 DIAGNOSIS — F0390 Unspecified dementia without behavioral disturbance: Secondary | ICD-10-CM | POA: Diagnosis not present

## 2015-11-28 ENCOUNTER — Encounter: Payer: Self-pay | Admitting: Family Medicine

## 2015-11-28 ENCOUNTER — Ambulatory Visit (INDEPENDENT_AMBULATORY_CARE_PROVIDER_SITE_OTHER): Payer: Commercial Managed Care - HMO | Admitting: Family Medicine

## 2015-11-28 VITALS — BP 150/72 | HR 65 | Temp 98.5°F | Resp 18 | Ht 66.0 in | Wt 181.0 lb

## 2015-11-28 DIAGNOSIS — N185 Chronic kidney disease, stage 5: Secondary | ICD-10-CM

## 2015-11-28 DIAGNOSIS — F0390 Unspecified dementia without behavioral disturbance: Secondary | ICD-10-CM | POA: Diagnosis not present

## 2015-11-28 DIAGNOSIS — Z992 Dependence on renal dialysis: Secondary | ICD-10-CM | POA: Diagnosis not present

## 2015-11-28 DIAGNOSIS — I1311 Hypertensive heart and chronic kidney disease without heart failure, with stage 5 chronic kidney disease, or end stage renal disease: Secondary | ICD-10-CM

## 2015-11-28 DIAGNOSIS — J9611 Chronic respiratory failure with hypoxia: Secondary | ICD-10-CM | POA: Diagnosis not present

## 2015-11-28 DIAGNOSIS — J069 Acute upper respiratory infection, unspecified: Secondary | ICD-10-CM

## 2015-11-28 DIAGNOSIS — N186 End stage renal disease: Secondary | ICD-10-CM | POA: Diagnosis not present

## 2015-11-28 MED ORDER — AZITHROMYCIN 250 MG PO TABS: ORAL_TABLET | ORAL | 0 refills | Status: DC

## 2015-11-28 MED ORDER — GUAIFENESIN-DM 100-10 MG/5ML PO SYRP: 5.0000 mL | ORAL_SOLUTION | ORAL | 0 refills | Status: DC | PRN

## 2015-11-28 NOTE — Assessment & Plan Note (Signed)
Improved with oxygen therapy

## 2015-11-28 NOTE — Progress Notes (Signed)
   Subjective:    Patient ID: [name], female    DOB: 07-Sep-1925, 82 y.o.   MRN: [id number]  Patient presents for ER F/U (SOB, syncope)  Patient here for ER follow-up and shortness of breath. We received a call from the nursing facility on 1113 some nurse that had evaluated her was concerned she was wearing her oxygen most of the day stated she had been short of breath. When my nurse called to inquire about this she was at dialysis and was told that she did not sound congested. It was recommended that if she does have shortness of breath that she needs to go to the hospital. She admits she continued to have some chest congestion and heaviness. No fever, no wheezing. No cough medicine used. She had she went to the emergency room 2 days later on the 15th after what sounds like a presyncopal event. She was at dialysis and she started having some weakness , she felt warm all over and her BP was going up and some twitching is not clear that this was true seizure-like activity. CT scan in the emergency room was unremarkable her labs were stable she was discharged home. No CXR was done, EKG did not show any acute changes  Reviewed her labs hemoglobin was stable at 10 white blood cell count was normal She had mild hyponatremia 132  She is currently wearing 2 L of oxygen  Review Of Systems:  GEN- denies fatigue, fever, weight loss,weakness, recent illness HEENT- denies eye drainage, change in vision, nasal discharge, CVS- denies chest pain, palpitations RESP- denies SOB, +cough, wheeze ABD- denies N/V, change in stools, abd pain GU- denies dysuria, hematuria, dribbling, incontinence MSK- denies joint pain, muscle aches, injury Neuro- denies headache, dizziness, syncope, seizure activity       Objective:    BP (!) 150/72 (BP Location: Right Arm, Patient Position: Sitting, Cuff Size: Large)   Pulse 65   Temp 98.5 F (36.9 C) (Oral)   Resp 18   Ht 5\' 6"  (1.676 m)   Wt 181 lb (82.1 kg)    LMP 12/24/2010   SpO2 96% Comment: 2 L/ min via   BMI 29.21 kg/m  GEN- NAD, alert and oriented x3,walks with walker HEENT- PERRL, EOMI, non injected sclera, pink conjunctiva, MMM  oropharynx clear,nares clear Neck- Supple, no thyromegaly CVS- RRR,  3/6 SEM  RESP- mild rhonchi, hoarse like voice, no wheeze, normal WOB at rest with oxygen  ABD-NABS,soft,NT,ND,  Neuro-CNII-XII in-tact, no focal deficit  EXT- No edema Pulses- Radial, 2+      Assessment & Plan:      Problem List Items Addressed This Visit    Benign hypertensive heart and kidney disease and CKD stage V (HCC) - Primary   Relevant Orders   EKG 12-Lead (Completed)    Other Visit Diagnoses    Acute URI       She has been congested likely with URI, high risk for PNA, oN HD, had this pre-syncopal event. Given Robiitussin and zpak in case of devlopiong PNA, no CXR done will just treat instead of send back to hospital to have this.    Relevant Medications   azithromycin (ZITHROMAX) 250 MG tablet    Chronic respiratory failure- improved with oxygen therapy     Note: This dictation was prepared with Dragon dictation along with smaller phrase technology. Any transcriptional errors that result from this process are unintentional.

## 2015-11-28 NOTE — Patient Instructions (Signed)
Take robutussin for cough Zpak given

## 2015-11-29 DIAGNOSIS — F0390 Unspecified dementia without behavioral disturbance: Secondary | ICD-10-CM | POA: Diagnosis not present

## 2015-11-30 DIAGNOSIS — F0390 Unspecified dementia without behavioral disturbance: Secondary | ICD-10-CM | POA: Diagnosis not present

## 2015-12-01 DIAGNOSIS — N186 End stage renal disease: Secondary | ICD-10-CM | POA: Diagnosis not present

## 2015-12-01 DIAGNOSIS — Z992 Dependence on renal dialysis: Secondary | ICD-10-CM | POA: Diagnosis not present

## 2015-12-01 DIAGNOSIS — F0390 Unspecified dementia without behavioral disturbance: Secondary | ICD-10-CM | POA: Diagnosis not present

## 2015-12-02 DIAGNOSIS — F0390 Unspecified dementia without behavioral disturbance: Secondary | ICD-10-CM | POA: Diagnosis not present

## 2015-12-03 DIAGNOSIS — F0390 Unspecified dementia without behavioral disturbance: Secondary | ICD-10-CM | POA: Diagnosis not present

## 2015-12-03 DIAGNOSIS — N186 End stage renal disease: Secondary | ICD-10-CM | POA: Diagnosis not present

## 2015-12-03 DIAGNOSIS — Z992 Dependence on renal dialysis: Secondary | ICD-10-CM | POA: Diagnosis not present

## 2015-12-04 DIAGNOSIS — F0390 Unspecified dementia without behavioral disturbance: Secondary | ICD-10-CM | POA: Diagnosis not present

## 2015-12-05 DIAGNOSIS — N186 End stage renal disease: Secondary | ICD-10-CM | POA: Diagnosis not present

## 2015-12-05 DIAGNOSIS — Z992 Dependence on renal dialysis: Secondary | ICD-10-CM | POA: Diagnosis not present

## 2015-12-05 DIAGNOSIS — F0390 Unspecified dementia without behavioral disturbance: Secondary | ICD-10-CM | POA: Diagnosis not present

## 2015-12-06 DIAGNOSIS — F0390 Unspecified dementia without behavioral disturbance: Secondary | ICD-10-CM | POA: Diagnosis not present

## 2015-12-07 DIAGNOSIS — F0390 Unspecified dementia without behavioral disturbance: Secondary | ICD-10-CM | POA: Diagnosis not present

## 2015-12-08 DIAGNOSIS — F0390 Unspecified dementia without behavioral disturbance: Secondary | ICD-10-CM | POA: Diagnosis not present

## 2015-12-08 DIAGNOSIS — Z992 Dependence on renal dialysis: Secondary | ICD-10-CM | POA: Diagnosis not present

## 2015-12-08 DIAGNOSIS — N186 End stage renal disease: Secondary | ICD-10-CM | POA: Diagnosis not present

## 2015-12-09 DIAGNOSIS — F0390 Unspecified dementia without behavioral disturbance: Secondary | ICD-10-CM | POA: Diagnosis not present

## 2015-12-10 DIAGNOSIS — N186 End stage renal disease: Secondary | ICD-10-CM | POA: Diagnosis not present

## 2015-12-10 DIAGNOSIS — Z992 Dependence on renal dialysis: Secondary | ICD-10-CM | POA: Diagnosis not present

## 2015-12-10 DIAGNOSIS — F0390 Unspecified dementia without behavioral disturbance: Secondary | ICD-10-CM | POA: Diagnosis not present

## 2015-12-11 ENCOUNTER — Encounter: Payer: Self-pay | Admitting: Cardiovascular Disease

## 2015-12-11 ENCOUNTER — Ambulatory Visit (HOSPITAL_COMMUNITY)
Admission: RE | Admit: 2015-12-11 | Discharge: 2015-12-11 | Disposition: A | Discharge status: Home / Self Care | Payer: Commercial Managed Care - HMO | Source: Ambulatory Visit | Attending: Cardiovascular Disease | Admitting: Cardiovascular Disease

## 2015-12-11 ENCOUNTER — Ambulatory Visit (INDEPENDENT_AMBULATORY_CARE_PROVIDER_SITE_OTHER): Payer: Commercial Managed Care - HMO | Admitting: Cardiovascular Disease

## 2015-12-11 VITALS — BP 142/50 | HR 67 | Ht 66.0 in | Wt 191.0 lb

## 2015-12-11 DIAGNOSIS — M5134 Other intervertebral disc degeneration, thoracic region: Secondary | ICD-10-CM | POA: Diagnosis not present

## 2015-12-11 DIAGNOSIS — Z992 Dependence on renal dialysis: Secondary | ICD-10-CM | POA: Diagnosis not present

## 2015-12-11 DIAGNOSIS — R0609 Other forms of dyspnea: Secondary | ICD-10-CM

## 2015-12-11 DIAGNOSIS — N186 End stage renal disease: Secondary | ICD-10-CM | POA: Diagnosis not present

## 2015-12-11 DIAGNOSIS — I5032 Chronic diastolic (congestive) heart failure: Secondary | ICD-10-CM

## 2015-12-11 DIAGNOSIS — I10 Essential (primary) hypertension: Secondary | ICD-10-CM | POA: Diagnosis not present

## 2015-12-11 DIAGNOSIS — I700 Atherosclerosis of aorta: Secondary | ICD-10-CM | POA: Diagnosis not present

## 2015-12-11 DIAGNOSIS — I509 Heart failure, unspecified: Secondary | ICD-10-CM | POA: Diagnosis not present

## 2015-12-11 DIAGNOSIS — F0390 Unspecified dementia without behavioral disturbance: Secondary | ICD-10-CM | POA: Diagnosis not present

## 2015-12-11 DIAGNOSIS — I350 Nonrheumatic aortic (valve) stenosis: Secondary | ICD-10-CM

## 2015-12-11 MED ORDER — PREDNISONE 5 MG PO TABS: ORAL_TABLET | ORAL | 0 refills | Status: DC

## 2015-12-11 NOTE — Progress Notes (Signed)
SUBJECTIVE: The patient is an 82 year old woman with a history of chronic kidney disease stage V on hemodialysis, diabetes, hypertension, hyperlipidemia, and chronic diastolic heart failure.   Echocardiogram 05/27/15: Normal left ventricular systolic function, LVEF 16-10%, moderate LVH, grade 1 diastolic dysfunction with high filling pressures, mild aortic stenosis.  Normal nuclear stress test 05/27/15.  She has had progressive exertional dyspnea. She has had occasional chest pressure. She said she had a "bad night" and her legs hurt. She was recently prescribed azithromycin. She has no leg swelling.     Review of Systems: As per "subjective", otherwise negative.  No Known Allergies  Current Outpatient Prescriptions  Medication Sig Dispense Refill  . albuterol (PROVENTIL HFA;VENTOLIN HFA) 108 (90 Base) MCG/ACT inhaler Inhale 2 puffs into the lungs every 4 (four) hours as needed for wheezing or shortness of breath.    Marland Kitchen albuterol (PROVENTIL) (2.5 MG/3ML) 0.083% nebulizer solution USE 1 VIAL IN NEBULIZER 3 TIMES DAILY AS NEEDED FOR WHEEZING/SHORTNESS OF BREATH. 360 mL 0  . amLODipine (NORVASC) 10 MG tablet TAKE 1 TABLET BY MOUTH EVERY MORNING. 30 tablet 3  . ASPIRIN LOW DOSE 81 MG EC tablet TAKE (1) TABLET BY MOUTH ONCE DAILY. 30 tablet 3  . azithromycin (ZITHROMAX) 250 MG tablet Take 2 tablets x 1 day, then 1 tab daily for 4 days 6 tablet 0  . bisacodyl (DULCOLAX) 10 MG suppository 1 suppository rectally  Daily every 3 days as needed for constipation 12 suppository 2  . citalopram (CELEXA) 20 MG tablet TAKE (1) TABLET BY MOUTH ONCE IN THE MORNING. 30 tablet 3  . DOK 100 MG capsule TAKE 1 CAPSULE BY MOUTH TWICE DAILY. 60 capsule 3  . etodolac (LODINE) 400 MG tablet Take 400 mg by mouth every 12 (twelve) hours as needed for mild pain.    Marland Kitchen guaiFENesin-dextromethorphan (ROBITUSSIN DM) 100-10 MG/5ML syrup Take 5 mLs by mouth every 4 (four) hours as needed for cough. 140 mL 0  .  ipratropium (ATROVENT) 0.02 % nebulizer solution USE 1 VIAL IN NEBULIZER 3 TIMES DAILY AS NEEDED FOR WHEEZING/SHORTNESS OF BREATH. 360 mL 0  . isosorbide mononitrate (IMDUR) 60 MG 24 hr tablet TAKE (1) TABLET BY MOUTH ONCE DAILY. 30 tablet 3  . Lancets MISC Use as directed to monitor FSBS 1x daily x2 weeks, then 1x daily PRN S/Sx hypoglycemia. Dx: E11.9. 50 each 1  . levothyroxine (SYNTHROID, LEVOTHROID) 25 MCG tablet TAKE (1) TABLET BY MOUTH ONCE DAILY BEFORE BREAKFAST. 30 tablet 3  . lidocaine-prilocaine (EMLA) cream APPLY TOPICALLY TO LEFT UPPER ARM 1 HOUR PRIOR TO DIALYSIS ON MONDAY,WEDNESDAY & FRIDAYS AS NEEDED FOR PAIN. WRAP WITH PLASTIC WRAP AFTER A 30 g 0  . mirtazapine (REMERON) 15 MG tablet TAKE 1/2 TABLET (7.5MG ) BY MOUTH AT BEDTIME FOR MOOD/APPETITE. 15 tablet 3  . Multiple Vitamin (DAILY-VITE) TABS TAKE 1 TABLET BY MOUTH ONCE DAILY. (ON DAY OF DIALYSIS, TAKE AFTER DIALYSIS) 30 tablet 3  . nitroGLYCERIN (NITROSTAT) 0.4 MG SL tablet DISSOLVE 1 TABLET UNDER THE TONGUE EVERY 5 MINUTES X2 DOSES THEN TO ER 25 tablet 0  . OXYGEN Inhale 2 L into the lungs daily as needed (shortness of breath).    . polyethylene glycol powder (MIRALAX) powder Take 17 g by mouth daily. Hold for diarrhea 255 g 6  . Q-NOL 325 MG tablet TAKE (2) TABLETS FOUR TIMES DAILY AS NEEDED FOR PAIN. -MAY MAKE DROWSY - 120 tablet 0  . ranitidine (ZANTAC) 150 MG tablet TAKE 1 TABLET  BY MOUTH AT BEDTIME. 30 tablet 3  . SENSIPAR 30 MG tablet TAKE 1 TABLET BY MOUTH ONCE DAILY AFTER THE EVENING MEAL 31 tablet PRN  . sevelamer (RENVELA) 800 MG tablet Take 800-2,400 mg by mouth 2 (two) times daily. Take 1 tablet twice a day, 3 tablets with meals    . traMADol (ULTRAM) 50 MG tablet Take 50 mg by mouth every 12 (twelve) hours as needed.    . Travoprost, BAK Free, (TRAVATAN) 0.004 % SOLN ophthalmic solution Place 1 drop into both eyes at bedtime.     No current facility-administered medications for this visit.     Past Medical  History:  Diagnosis Date  . Anemia   . Anemia in CKD (chronic kidney disease)   . Anxiety   . Arthritis    gout, right shoulder  . Cancer [facility])    ?stomach cancer   . Cataracts, both eyes   . CHF (congestive heart failure) ([place])   . CKD (chronic kidney disease), stage V ([place])    on HD on M/W/F  . Depression   . Diabetes mellitus, type 2 ([place])   . GERD (gastroesophageal reflux disease)   . Gynecologic cancer   . Hyperlipidemia   . Hypertension   . IBS (irritable bowel syndrome)    with costipation  . Intraoperative hemorrhage and hematoma of a digestive system organ or structure complicating a digestive system procedure   . Overactive bladder   . Pneumonia    2 times in 2016  . SOB (shortness of breath)    nml spirometry 09/05/06  . Stroke [facility])    residual right side weakness and pain  . SVT (supraventricular tachycardia) ([place])   . Syncope and collapse   . Thyroid disease     Past Surgical History:  Procedure Laterality Date  . AV FISTULA PLACEMENT, BRACHIOCEPHALIC  34/28/7681   left arm   by Dr. Bridgett Larsson  . COLONOSCOPY    . FISTULA SUPERFICIALIZATION Left 06/10/2015   Procedure: LEFT UPPER ARM BRACHIOCEPHALIC FISTULA PSEUDOANEURYSM PLICATION;  Surgeon: Conrad Waynesville, MD;  Location: [facility];  Service: Vascular;  Laterality: Left;  . PERIPHERAL VASCULAR CATHETERIZATION Left 05/15/2015   Procedure: Fistulagram;  Surgeon: Conrad Redland, MD;  Location: [facility];  Service: Cardiovascular;  Laterality: Left;  . TEE WITHOUT CARDIOVERSION  2011   2/2 Strep Bovis infection  . VESICOVAGINAL FISTULA CLOSURE W/ TAH      Social History   Social History  . Marital status: Widowed    Spouse name: N/A  . Number of children: N/A  . Years of education: N/A   Occupational History  . Not on file.   Social History Main Topics  . Smoking status: Never Smoker  . Smokeless tobacco: Never Used  . Alcohol use No  . Drug use: No  . Sexual activity: No   Other Topics Concern  .  Not on file   Social History Narrative  . No narrative on file     Vitals:   12/11/15 1342  BP: (!) 142/50  Pulse: 67  SpO2: 98%  Weight: 191 lb (86.6 kg)  Height: 5\' 6"  (1.676 m)    PHYSICAL EXAM General: NAD Neck: No JVD, no thyromegaly or thyroid nodule.  Lungs: +wheezes (insp/exp), no rales. CV: Nondisplaced PMI. Regular rate and rhythm, normal S1/S2, no S3/S4, 2/6 ejection systolic murmur over RUSB and along left sternal border.  No peripheral edema.   Abdomen: Soft, nontender, no distention.  Skin:  Intact without lesions or rashes.  Neurologic: Alert and oriented.  Psych: Normal affect. Extremities: No clubbing or cyanosis.  HEENT: Normal.     ECG: Most recent ECG reviewed.      ASSESSMENT AND PLAN: 1. Chronic diastolic heart failure: Euvolemic. Volume management per dialysis.  2. Essential HTN: Controlled for age. No changes.  3. SOB: Likely pulmonary in etiology. Receives albuterol and breathing treatments. I will obtain a chest xray and prescribe prednisone 5 mg daily x 5 days. Encouraged to see PCP next week.  4. Aortic stenosis: Mild. Will monitor.  Dispo: fu 1 yr   Kate Sable, M.D., F.A.C.C.

## 2015-12-11 NOTE — Patient Instructions (Signed)
Your physician wants you to follow-up in: 1 year Dr Virgina Jock will receive a reminder letter in the mail two months in advance. If you don't receive a letter, please call our office to schedule the follow-up appointment.    Get chest xray NOW   Take prednisone 5 mg daily for 5 days and then STOP    See your primary care doctor next week     Thank you for choosing [facility] !

## 2015-12-12 DIAGNOSIS — F0390 Unspecified dementia without behavioral disturbance: Secondary | ICD-10-CM | POA: Diagnosis not present

## 2015-12-12 DIAGNOSIS — Z992 Dependence on renal dialysis: Secondary | ICD-10-CM | POA: Diagnosis not present

## 2015-12-12 DIAGNOSIS — N186 End stage renal disease: Secondary | ICD-10-CM | POA: Diagnosis not present

## 2015-12-13 DIAGNOSIS — F0390 Unspecified dementia without behavioral disturbance: Secondary | ICD-10-CM | POA: Diagnosis not present

## 2015-12-14 DIAGNOSIS — F0390 Unspecified dementia without behavioral disturbance: Secondary | ICD-10-CM | POA: Diagnosis not present

## 2015-12-15 DIAGNOSIS — N186 End stage renal disease: Secondary | ICD-10-CM | POA: Diagnosis not present

## 2015-12-15 DIAGNOSIS — R0602 Shortness of breath: Secondary | ICD-10-CM | POA: Diagnosis not present

## 2015-12-15 DIAGNOSIS — F0390 Unspecified dementia without behavioral disturbance: Secondary | ICD-10-CM | POA: Diagnosis not present

## 2015-12-15 DIAGNOSIS — Z992 Dependence on renal dialysis: Secondary | ICD-10-CM | POA: Diagnosis not present

## 2015-12-15 DIAGNOSIS — R2681 Unsteadiness on feet: Secondary | ICD-10-CM | POA: Diagnosis not present

## 2015-12-15 DIAGNOSIS — R269 Unspecified abnormalities of gait and mobility: Secondary | ICD-10-CM | POA: Diagnosis not present

## 2015-12-15 DIAGNOSIS — J9610 Chronic respiratory failure, unspecified whether with hypoxia or hypercapnia: Secondary | ICD-10-CM | POA: Diagnosis not present

## 2015-12-15 DIAGNOSIS — I509 Heart failure, unspecified: Secondary | ICD-10-CM | POA: Diagnosis not present

## 2015-12-15 DIAGNOSIS — R296 Repeated falls: Secondary | ICD-10-CM | POA: Diagnosis not present

## 2015-12-15 DIAGNOSIS — J449 Chronic obstructive pulmonary disease, unspecified: Secondary | ICD-10-CM | POA: Diagnosis not present

## 2015-12-15 DIAGNOSIS — I5032 Chronic diastolic (congestive) heart failure: Secondary | ICD-10-CM | POA: Diagnosis not present

## 2015-12-16 DIAGNOSIS — F0390 Unspecified dementia without behavioral disturbance: Secondary | ICD-10-CM | POA: Diagnosis not present

## 2015-12-17 ENCOUNTER — Encounter: Payer: Self-pay | Admitting: Family Medicine

## 2015-12-17 ENCOUNTER — Ambulatory Visit (INDEPENDENT_AMBULATORY_CARE_PROVIDER_SITE_OTHER): Payer: Commercial Managed Care - HMO | Admitting: Family Medicine

## 2015-12-17 VITALS — BP 144/62 | HR 66 | Temp 98.5°F | Resp 16 | Ht 66.0 in | Wt 188.0 lb

## 2015-12-17 DIAGNOSIS — J069 Acute upper respiratory infection, unspecified: Secondary | ICD-10-CM

## 2015-12-17 DIAGNOSIS — J9611 Chronic respiratory failure with hypoxia: Secondary | ICD-10-CM | POA: Diagnosis not present

## 2015-12-17 DIAGNOSIS — F0390 Unspecified dementia without behavioral disturbance: Secondary | ICD-10-CM | POA: Diagnosis not present

## 2015-12-17 DIAGNOSIS — Z992 Dependence on renal dialysis: Secondary | ICD-10-CM | POA: Diagnosis not present

## 2015-12-17 DIAGNOSIS — N186 End stage renal disease: Secondary | ICD-10-CM | POA: Diagnosis not present

## 2015-12-17 NOTE — Patient Instructions (Signed)
Change F/U to April

## 2015-12-17 NOTE — Progress Notes (Signed)
   Subjective:    Patient ID: [name], female    DOB: [birth year], 82 y.o.   MRN: [id number]  Patient presents for F/U (is not fasting- has been seen by Cards- was given pred for wheezig and told to F/U with PCP)  Here for follow-up from the cardiologist's office. She was actually seen on the 17th after she had been to the hospital with some shortness of breath. She also had what sounded like a presyncopal event. CT scan of her head was unremarkable she did not have a chest x-ray at that time EKG did not show any acute changes. She did go to the cardiologist office regarding this shortness of breath, he was prescribed prednisone for 5 days and to follow-up , chest x-ray was done which not show any acute changes she is currently on 2 L of oxygen in setting of general debility and chronic respiratory failure.  I did treat her for upper respiratory infection on November 17 with azithromycin and Robitussin Still has rare cough, no concerns, states " I don't know why  I'm here today, I feel fine"  Review Of Systems:  GEN- denies fatigue, fever, weight loss,weakness, recent illness HEENT- denies eye drainage, change in vision, nasal discharge, CVS- denies chest pain, palpitations RESP- denies SOB, cough, wheeze ABD- denies N/V, change in stools, abd pain GU- denies dysuria, hematuria, dribbling, incontinence MSK- denies joint pain, muscle aches, injury Neuro- denies headache, dizziness, syncope, seizure activity       Objective:    BP (!) 144/62 (BP Location: Right Arm, Patient Position: Sitting, Cuff Size: Large)   Pulse 66   Temp 98.5 F (36.9 C) (Oral)   Resp 16   Ht 5\' 6"  (1.676 m)   Wt 188 lb (85.3 kg)   LMP 12/24/2010   SpO2 97%   BMI 30.34 kg/m  GEN- NAD, alert and oriented x3,walks with walker  HEENT- PERRL, EOMI, non injected sclera, pink conjunctiva, MMM, oropharynx clear Neck- Supple,  CVS- RRR, 3/6 SEM RESP-CTAB EXT- No edema Pulses- Radial  2+         Assessment & Plan:      Problem List Items Addressed This Visit    Chronic respiratory failure (HCC) - Primary (Chronic)    Now back at baseline, recent URI, occ cough, can use robtussin F/U April        Other Visit Diagnoses    Acute URI       resolved no further medications needed       Note: This dictation was prepared with Dragon dictation along with smaller phrase technology. Any transcriptional errors that result from this process are unintentional.

## 2015-12-17 NOTE — Assessment & Plan Note (Signed)
Now back at baseline, recent URI, occ cough, can use robtussin F/U April

## 2015-12-18 DIAGNOSIS — F0390 Unspecified dementia without behavioral disturbance: Secondary | ICD-10-CM | POA: Diagnosis not present

## 2015-12-19 DIAGNOSIS — F0390 Unspecified dementia without behavioral disturbance: Secondary | ICD-10-CM | POA: Diagnosis not present

## 2015-12-19 DIAGNOSIS — R32 Unspecified urinary incontinence: Secondary | ICD-10-CM | POA: Diagnosis not present

## 2015-12-19 DIAGNOSIS — N186 End stage renal disease: Secondary | ICD-10-CM | POA: Diagnosis not present

## 2015-12-19 DIAGNOSIS — Z992 Dependence on renal dialysis: Secondary | ICD-10-CM | POA: Diagnosis not present

## 2015-12-19 DIAGNOSIS — R159 Full incontinence of feces: Secondary | ICD-10-CM | POA: Diagnosis not present

## 2015-12-20 DIAGNOSIS — F0390 Unspecified dementia without behavioral disturbance: Secondary | ICD-10-CM | POA: Diagnosis not present

## 2015-12-21 DIAGNOSIS — F0390 Unspecified dementia without behavioral disturbance: Secondary | ICD-10-CM | POA: Diagnosis not present

## 2015-12-22 DIAGNOSIS — F0390 Unspecified dementia without behavioral disturbance: Secondary | ICD-10-CM | POA: Diagnosis not present

## 2015-12-22 DIAGNOSIS — N186 End stage renal disease: Secondary | ICD-10-CM | POA: Diagnosis not present

## 2015-12-22 DIAGNOSIS — Z992 Dependence on renal dialysis: Secondary | ICD-10-CM | POA: Diagnosis not present

## 2015-12-23 DIAGNOSIS — F0390 Unspecified dementia without behavioral disturbance: Secondary | ICD-10-CM | POA: Diagnosis not present

## 2015-12-24 DIAGNOSIS — F0390 Unspecified dementia without behavioral disturbance: Secondary | ICD-10-CM | POA: Diagnosis not present

## 2015-12-24 DIAGNOSIS — Z992 Dependence on renal dialysis: Secondary | ICD-10-CM | POA: Diagnosis not present

## 2015-12-24 DIAGNOSIS — N186 End stage renal disease: Secondary | ICD-10-CM | POA: Diagnosis not present

## 2015-12-25 DIAGNOSIS — F0390 Unspecified dementia without behavioral disturbance: Secondary | ICD-10-CM | POA: Diagnosis not present

## 2015-12-26 DIAGNOSIS — F0390 Unspecified dementia without behavioral disturbance: Secondary | ICD-10-CM | POA: Diagnosis not present

## 2015-12-26 DIAGNOSIS — N186 End stage renal disease: Secondary | ICD-10-CM | POA: Diagnosis not present

## 2015-12-26 DIAGNOSIS — Z992 Dependence on renal dialysis: Secondary | ICD-10-CM | POA: Diagnosis not present

## 2015-12-27 DIAGNOSIS — F0390 Unspecified dementia without behavioral disturbance: Secondary | ICD-10-CM | POA: Diagnosis not present

## 2015-12-28 DIAGNOSIS — F0390 Unspecified dementia without behavioral disturbance: Secondary | ICD-10-CM | POA: Diagnosis not present

## 2015-12-29 DIAGNOSIS — F0390 Unspecified dementia without behavioral disturbance: Secondary | ICD-10-CM | POA: Diagnosis not present

## 2015-12-29 DIAGNOSIS — N186 End stage renal disease: Secondary | ICD-10-CM | POA: Diagnosis not present

## 2015-12-29 DIAGNOSIS — Z992 Dependence on renal dialysis: Secondary | ICD-10-CM | POA: Diagnosis not present

## 2015-12-30 DIAGNOSIS — F0390 Unspecified dementia without behavioral disturbance: Secondary | ICD-10-CM | POA: Diagnosis not present

## 2015-12-31 DIAGNOSIS — Z79899 Other long term (current) drug therapy: Secondary | ICD-10-CM | POA: Diagnosis not present

## 2015-12-31 DIAGNOSIS — E119 Type 2 diabetes mellitus without complications: Secondary | ICD-10-CM | POA: Diagnosis not present

## 2015-12-31 DIAGNOSIS — N186 End stage renal disease: Secondary | ICD-10-CM | POA: Diagnosis not present

## 2015-12-31 DIAGNOSIS — F0390 Unspecified dementia without behavioral disturbance: Secondary | ICD-10-CM | POA: Diagnosis not present

## 2015-12-31 DIAGNOSIS — E785 Hyperlipidemia, unspecified: Secondary | ICD-10-CM | POA: Diagnosis not present

## 2015-12-31 DIAGNOSIS — Z992 Dependence on renal dialysis: Secondary | ICD-10-CM | POA: Diagnosis not present

## 2016-01-01 DIAGNOSIS — F0390 Unspecified dementia without behavioral disturbance: Secondary | ICD-10-CM | POA: Diagnosis not present

## 2016-01-02 DIAGNOSIS — F0390 Unspecified dementia without behavioral disturbance: Secondary | ICD-10-CM | POA: Diagnosis not present

## 2016-01-02 DIAGNOSIS — N186 End stage renal disease: Secondary | ICD-10-CM | POA: Diagnosis not present

## 2016-01-02 DIAGNOSIS — Z992 Dependence on renal dialysis: Secondary | ICD-10-CM | POA: Diagnosis not present

## 2016-01-03 DIAGNOSIS — F0390 Unspecified dementia without behavioral disturbance: Secondary | ICD-10-CM | POA: Diagnosis not present

## 2016-01-03 DIAGNOSIS — Z992 Dependence on renal dialysis: Secondary | ICD-10-CM | POA: Diagnosis not present

## 2016-01-03 DIAGNOSIS — N186 End stage renal disease: Secondary | ICD-10-CM | POA: Diagnosis not present

## 2016-01-04 DIAGNOSIS — F0390 Unspecified dementia without behavioral disturbance: Secondary | ICD-10-CM | POA: Diagnosis not present

## 2016-01-05 DIAGNOSIS — F0390 Unspecified dementia without behavioral disturbance: Secondary | ICD-10-CM | POA: Diagnosis not present

## 2016-01-06 DIAGNOSIS — F0390 Unspecified dementia without behavioral disturbance: Secondary | ICD-10-CM | POA: Diagnosis not present

## 2016-01-07 DIAGNOSIS — N186 End stage renal disease: Secondary | ICD-10-CM | POA: Diagnosis not present

## 2016-01-07 DIAGNOSIS — Z992 Dependence on renal dialysis: Secondary | ICD-10-CM | POA: Diagnosis not present

## 2016-01-07 DIAGNOSIS — F0390 Unspecified dementia without behavioral disturbance: Secondary | ICD-10-CM | POA: Diagnosis not present

## 2016-01-08 DIAGNOSIS — F0390 Unspecified dementia without behavioral disturbance: Secondary | ICD-10-CM | POA: Diagnosis not present

## 2016-01-09 DIAGNOSIS — F0390 Unspecified dementia without behavioral disturbance: Secondary | ICD-10-CM | POA: Diagnosis not present

## 2016-01-09 DIAGNOSIS — N186 End stage renal disease: Secondary | ICD-10-CM | POA: Diagnosis not present

## 2016-01-09 DIAGNOSIS — Z992 Dependence on renal dialysis: Secondary | ICD-10-CM | POA: Diagnosis not present

## 2016-01-10 DIAGNOSIS — F0390 Unspecified dementia without behavioral disturbance: Secondary | ICD-10-CM | POA: Diagnosis not present

## 2016-01-11 DIAGNOSIS — F0390 Unspecified dementia without behavioral disturbance: Secondary | ICD-10-CM | POA: Diagnosis not present

## 2016-01-11 DIAGNOSIS — Z992 Dependence on renal dialysis: Secondary | ICD-10-CM | POA: Diagnosis not present

## 2016-01-11 DIAGNOSIS — N186 End stage renal disease: Secondary | ICD-10-CM | POA: Diagnosis not present

## 2016-01-12 DIAGNOSIS — F0390 Unspecified dementia without behavioral disturbance: Secondary | ICD-10-CM | POA: Diagnosis not present

## 2016-01-13 DIAGNOSIS — F0390 Unspecified dementia without behavioral disturbance: Secondary | ICD-10-CM | POA: Diagnosis not present

## 2016-01-14 DIAGNOSIS — N186 End stage renal disease: Secondary | ICD-10-CM | POA: Diagnosis not present

## 2016-01-14 DIAGNOSIS — Z992 Dependence on renal dialysis: Secondary | ICD-10-CM | POA: Diagnosis not present

## 2016-01-14 DIAGNOSIS — F0390 Unspecified dementia without behavioral disturbance: Secondary | ICD-10-CM | POA: Diagnosis not present

## 2016-01-15 DIAGNOSIS — R2681 Unsteadiness on feet: Secondary | ICD-10-CM | POA: Diagnosis not present

## 2016-01-15 DIAGNOSIS — J9610 Chronic respiratory failure, unspecified whether with hypoxia or hypercapnia: Secondary | ICD-10-CM | POA: Diagnosis not present

## 2016-01-15 DIAGNOSIS — F0390 Unspecified dementia without behavioral disturbance: Secondary | ICD-10-CM | POA: Diagnosis not present

## 2016-01-15 DIAGNOSIS — J449 Chronic obstructive pulmonary disease, unspecified: Secondary | ICD-10-CM | POA: Diagnosis not present

## 2016-01-15 DIAGNOSIS — I509 Heart failure, unspecified: Secondary | ICD-10-CM | POA: Diagnosis not present

## 2016-01-15 DIAGNOSIS — I5032 Chronic diastolic (congestive) heart failure: Secondary | ICD-10-CM | POA: Diagnosis not present

## 2016-01-15 DIAGNOSIS — R269 Unspecified abnormalities of gait and mobility: Secondary | ICD-10-CM | POA: Diagnosis not present

## 2016-01-15 DIAGNOSIS — R0602 Shortness of breath: Secondary | ICD-10-CM | POA: Diagnosis not present

## 2016-01-15 DIAGNOSIS — R296 Repeated falls: Secondary | ICD-10-CM | POA: Diagnosis not present

## 2016-01-16 DIAGNOSIS — F0390 Unspecified dementia without behavioral disturbance: Secondary | ICD-10-CM | POA: Diagnosis not present

## 2016-01-16 DIAGNOSIS — N186 End stage renal disease: Secondary | ICD-10-CM | POA: Diagnosis not present

## 2016-01-16 DIAGNOSIS — Z992 Dependence on renal dialysis: Secondary | ICD-10-CM | POA: Diagnosis not present

## 2016-01-17 DIAGNOSIS — F0390 Unspecified dementia without behavioral disturbance: Secondary | ICD-10-CM | POA: Diagnosis not present

## 2016-01-18 DIAGNOSIS — F0390 Unspecified dementia without behavioral disturbance: Secondary | ICD-10-CM | POA: Diagnosis not present

## 2016-01-19 ENCOUNTER — Other Ambulatory Visit: Payer: Self-pay | Admitting: *Deleted

## 2016-01-19 DIAGNOSIS — N186 End stage renal disease: Secondary | ICD-10-CM | POA: Diagnosis not present

## 2016-01-19 DIAGNOSIS — F0390 Unspecified dementia without behavioral disturbance: Secondary | ICD-10-CM | POA: Diagnosis not present

## 2016-01-19 DIAGNOSIS — Z992 Dependence on renal dialysis: Secondary | ICD-10-CM | POA: Diagnosis not present

## 2016-01-19 MED ORDER — GUAIFENESIN-DM 100-10 MG/5ML PO SYRP: 5.0000 mL | ORAL_SOLUTION | ORAL | 0 refills | Status: DC | PRN

## 2016-01-20 DIAGNOSIS — F0390 Unspecified dementia without behavioral disturbance: Secondary | ICD-10-CM | POA: Diagnosis not present

## 2016-01-21 DIAGNOSIS — N186 End stage renal disease: Secondary | ICD-10-CM | POA: Diagnosis not present

## 2016-01-21 DIAGNOSIS — Z992 Dependence on renal dialysis: Secondary | ICD-10-CM | POA: Diagnosis not present

## 2016-01-21 DIAGNOSIS — F0390 Unspecified dementia without behavioral disturbance: Secondary | ICD-10-CM | POA: Diagnosis not present

## 2016-01-22 DIAGNOSIS — F0390 Unspecified dementia without behavioral disturbance: Secondary | ICD-10-CM | POA: Diagnosis not present

## 2016-01-23 DIAGNOSIS — F0390 Unspecified dementia without behavioral disturbance: Secondary | ICD-10-CM | POA: Diagnosis not present

## 2016-01-23 DIAGNOSIS — N186 End stage renal disease: Secondary | ICD-10-CM | POA: Diagnosis not present

## 2016-01-23 DIAGNOSIS — Z992 Dependence on renal dialysis: Secondary | ICD-10-CM | POA: Diagnosis not present

## 2016-01-24 DIAGNOSIS — F0390 Unspecified dementia without behavioral disturbance: Secondary | ICD-10-CM | POA: Diagnosis not present

## 2016-01-25 DIAGNOSIS — F0390 Unspecified dementia without behavioral disturbance: Secondary | ICD-10-CM | POA: Diagnosis not present

## 2016-01-26 DIAGNOSIS — N186 End stage renal disease: Secondary | ICD-10-CM | POA: Diagnosis not present

## 2016-01-26 DIAGNOSIS — Z992 Dependence on renal dialysis: Secondary | ICD-10-CM | POA: Diagnosis not present

## 2016-01-26 DIAGNOSIS — F0390 Unspecified dementia without behavioral disturbance: Secondary | ICD-10-CM | POA: Diagnosis not present

## 2016-01-27 ENCOUNTER — Other Ambulatory Visit: Payer: Self-pay | Admitting: Family Medicine

## 2016-01-27 DIAGNOSIS — F0390 Unspecified dementia without behavioral disturbance: Secondary | ICD-10-CM | POA: Diagnosis not present

## 2016-01-28 DIAGNOSIS — F0390 Unspecified dementia without behavioral disturbance: Secondary | ICD-10-CM | POA: Diagnosis not present

## 2016-01-29 DIAGNOSIS — F0390 Unspecified dementia without behavioral disturbance: Secondary | ICD-10-CM | POA: Diagnosis not present

## 2016-01-30 DIAGNOSIS — N186 End stage renal disease: Secondary | ICD-10-CM | POA: Diagnosis not present

## 2016-01-30 DIAGNOSIS — F0390 Unspecified dementia without behavioral disturbance: Secondary | ICD-10-CM | POA: Diagnosis not present

## 2016-01-30 DIAGNOSIS — Z992 Dependence on renal dialysis: Secondary | ICD-10-CM | POA: Diagnosis not present

## 2016-01-31 DIAGNOSIS — F0390 Unspecified dementia without behavioral disturbance: Secondary | ICD-10-CM | POA: Diagnosis not present

## 2016-02-01 DIAGNOSIS — F0390 Unspecified dementia without behavioral disturbance: Secondary | ICD-10-CM | POA: Diagnosis not present

## 2016-02-02 DIAGNOSIS — N186 End stage renal disease: Secondary | ICD-10-CM | POA: Diagnosis not present

## 2016-02-02 DIAGNOSIS — F0390 Unspecified dementia without behavioral disturbance: Secondary | ICD-10-CM | POA: Diagnosis not present

## 2016-02-02 DIAGNOSIS — Z992 Dependence on renal dialysis: Secondary | ICD-10-CM | POA: Diagnosis not present

## 2016-02-03 DIAGNOSIS — F0390 Unspecified dementia without behavioral disturbance: Secondary | ICD-10-CM | POA: Diagnosis not present

## 2016-02-04 DIAGNOSIS — Z992 Dependence on renal dialysis: Secondary | ICD-10-CM | POA: Diagnosis not present

## 2016-02-04 DIAGNOSIS — F0390 Unspecified dementia without behavioral disturbance: Secondary | ICD-10-CM | POA: Diagnosis not present

## 2016-02-04 DIAGNOSIS — N186 End stage renal disease: Secondary | ICD-10-CM | POA: Diagnosis not present

## 2016-02-05 DIAGNOSIS — F0390 Unspecified dementia without behavioral disturbance: Secondary | ICD-10-CM | POA: Diagnosis not present

## 2016-02-06 DIAGNOSIS — N186 End stage renal disease: Secondary | ICD-10-CM | POA: Diagnosis not present

## 2016-02-06 DIAGNOSIS — F0390 Unspecified dementia without behavioral disturbance: Secondary | ICD-10-CM | POA: Diagnosis not present

## 2016-02-06 DIAGNOSIS — Z992 Dependence on renal dialysis: Secondary | ICD-10-CM | POA: Diagnosis not present

## 2016-02-07 DIAGNOSIS — F0390 Unspecified dementia without behavioral disturbance: Secondary | ICD-10-CM | POA: Diagnosis not present

## 2016-02-08 DIAGNOSIS — F0390 Unspecified dementia without behavioral disturbance: Secondary | ICD-10-CM | POA: Diagnosis not present

## 2016-02-09 DIAGNOSIS — Z992 Dependence on renal dialysis: Secondary | ICD-10-CM | POA: Diagnosis not present

## 2016-02-09 DIAGNOSIS — N186 End stage renal disease: Secondary | ICD-10-CM | POA: Diagnosis not present

## 2016-02-09 DIAGNOSIS — F0390 Unspecified dementia without behavioral disturbance: Secondary | ICD-10-CM | POA: Diagnosis not present

## 2016-02-10 DIAGNOSIS — F0390 Unspecified dementia without behavioral disturbance: Secondary | ICD-10-CM | POA: Diagnosis not present

## 2016-02-11 DIAGNOSIS — Z992 Dependence on renal dialysis: Secondary | ICD-10-CM | POA: Diagnosis not present

## 2016-02-11 DIAGNOSIS — N186 End stage renal disease: Secondary | ICD-10-CM | POA: Diagnosis not present

## 2016-02-11 DIAGNOSIS — F0390 Unspecified dementia without behavioral disturbance: Secondary | ICD-10-CM | POA: Diagnosis not present

## 2016-02-12 DIAGNOSIS — Z992 Dependence on renal dialysis: Secondary | ICD-10-CM | POA: Diagnosis not present

## 2016-02-12 DIAGNOSIS — N186 End stage renal disease: Secondary | ICD-10-CM | POA: Diagnosis not present

## 2016-02-12 DIAGNOSIS — F0390 Unspecified dementia without behavioral disturbance: Secondary | ICD-10-CM | POA: Diagnosis not present

## 2016-02-13 DIAGNOSIS — N186 End stage renal disease: Secondary | ICD-10-CM | POA: Diagnosis not present

## 2016-02-13 DIAGNOSIS — Z992 Dependence on renal dialysis: Secondary | ICD-10-CM | POA: Diagnosis not present

## 2016-02-13 DIAGNOSIS — F0390 Unspecified dementia without behavioral disturbance: Secondary | ICD-10-CM | POA: Diagnosis not present

## 2016-02-14 DIAGNOSIS — F0390 Unspecified dementia without behavioral disturbance: Secondary | ICD-10-CM | POA: Diagnosis not present

## 2016-02-15 DIAGNOSIS — I5032 Chronic diastolic (congestive) heart failure: Secondary | ICD-10-CM | POA: Diagnosis not present

## 2016-02-15 DIAGNOSIS — I509 Heart failure, unspecified: Secondary | ICD-10-CM | POA: Diagnosis not present

## 2016-02-15 DIAGNOSIS — R2681 Unsteadiness on feet: Secondary | ICD-10-CM | POA: Diagnosis not present

## 2016-02-15 DIAGNOSIS — R269 Unspecified abnormalities of gait and mobility: Secondary | ICD-10-CM | POA: Diagnosis not present

## 2016-02-15 DIAGNOSIS — J449 Chronic obstructive pulmonary disease, unspecified: Secondary | ICD-10-CM | POA: Diagnosis not present

## 2016-02-15 DIAGNOSIS — R0602 Shortness of breath: Secondary | ICD-10-CM | POA: Diagnosis not present

## 2016-02-15 DIAGNOSIS — R296 Repeated falls: Secondary | ICD-10-CM | POA: Diagnosis not present

## 2016-02-15 DIAGNOSIS — J9610 Chronic respiratory failure, unspecified whether with hypoxia or hypercapnia: Secondary | ICD-10-CM | POA: Diagnosis not present

## 2016-02-15 DIAGNOSIS — F0390 Unspecified dementia without behavioral disturbance: Secondary | ICD-10-CM | POA: Diagnosis not present

## 2016-02-16 DIAGNOSIS — Z992 Dependence on renal dialysis: Secondary | ICD-10-CM | POA: Diagnosis not present

## 2016-02-16 DIAGNOSIS — N186 End stage renal disease: Secondary | ICD-10-CM | POA: Diagnosis not present

## 2016-02-16 DIAGNOSIS — F0390 Unspecified dementia without behavioral disturbance: Secondary | ICD-10-CM | POA: Diagnosis not present

## 2016-02-17 ENCOUNTER — Other Ambulatory Visit: Payer: Self-pay | Admitting: Family Medicine

## 2016-02-17 DIAGNOSIS — H34812 Central retinal vein occlusion, left eye, with macular edema: Secondary | ICD-10-CM | POA: Diagnosis not present

## 2016-02-17 DIAGNOSIS — H43813 Vitreous degeneration, bilateral: Secondary | ICD-10-CM | POA: Diagnosis not present

## 2016-02-17 DIAGNOSIS — H35371 Puckering of macula, right eye: Secondary | ICD-10-CM | POA: Diagnosis not present

## 2016-02-17 DIAGNOSIS — E113293 Type 2 diabetes mellitus with mild nonproliferative diabetic retinopathy without macular edema, bilateral: Secondary | ICD-10-CM | POA: Diagnosis not present

## 2016-02-17 DIAGNOSIS — F0390 Unspecified dementia without behavioral disturbance: Secondary | ICD-10-CM | POA: Diagnosis not present

## 2016-02-17 NOTE — Telephone Encounter (Signed)
Medication refilled per protocol. 

## 2016-02-18 ENCOUNTER — Other Ambulatory Visit: Payer: Self-pay | Admitting: *Deleted

## 2016-02-18 DIAGNOSIS — F0390 Unspecified dementia without behavioral disturbance: Secondary | ICD-10-CM | POA: Diagnosis not present

## 2016-02-18 DIAGNOSIS — E1051 Type 1 diabetes mellitus with diabetic peripheral angiopathy without gangrene: Secondary | ICD-10-CM | POA: Diagnosis not present

## 2016-02-18 DIAGNOSIS — N186 End stage renal disease: Secondary | ICD-10-CM | POA: Diagnosis not present

## 2016-02-18 DIAGNOSIS — L84 Corns and callosities: Secondary | ICD-10-CM | POA: Diagnosis not present

## 2016-02-18 DIAGNOSIS — Z992 Dependence on renal dialysis: Secondary | ICD-10-CM | POA: Diagnosis not present

## 2016-02-18 MED ORDER — TRAVOPROST (BAK FREE) 0.004 % OP SOLN: 1.0000 [drp] | Freq: Every day | OPHTHALMIC | 11 refills | Status: DC

## 2016-02-19 DIAGNOSIS — F0390 Unspecified dementia without behavioral disturbance: Secondary | ICD-10-CM | POA: Diagnosis not present

## 2016-02-20 DIAGNOSIS — N186 End stage renal disease: Secondary | ICD-10-CM | POA: Diagnosis not present

## 2016-02-20 DIAGNOSIS — F0390 Unspecified dementia without behavioral disturbance: Secondary | ICD-10-CM | POA: Diagnosis not present

## 2016-02-20 DIAGNOSIS — Z992 Dependence on renal dialysis: Secondary | ICD-10-CM | POA: Diagnosis not present

## 2016-02-21 DIAGNOSIS — F0390 Unspecified dementia without behavioral disturbance: Secondary | ICD-10-CM | POA: Diagnosis not present

## 2016-02-22 DIAGNOSIS — F0390 Unspecified dementia without behavioral disturbance: Secondary | ICD-10-CM | POA: Diagnosis not present

## 2016-02-23 DIAGNOSIS — F0390 Unspecified dementia without behavioral disturbance: Secondary | ICD-10-CM | POA: Diagnosis not present

## 2016-02-23 DIAGNOSIS — N186 End stage renal disease: Secondary | ICD-10-CM | POA: Diagnosis not present

## 2016-02-23 DIAGNOSIS — Z992 Dependence on renal dialysis: Secondary | ICD-10-CM | POA: Diagnosis not present

## 2016-02-24 DIAGNOSIS — F0390 Unspecified dementia without behavioral disturbance: Secondary | ICD-10-CM | POA: Diagnosis not present

## 2016-02-25 DIAGNOSIS — N186 End stage renal disease: Secondary | ICD-10-CM | POA: Diagnosis not present

## 2016-02-25 DIAGNOSIS — Z992 Dependence on renal dialysis: Secondary | ICD-10-CM | POA: Diagnosis not present

## 2016-02-25 DIAGNOSIS — F0390 Unspecified dementia without behavioral disturbance: Secondary | ICD-10-CM | POA: Diagnosis not present

## 2016-02-26 DIAGNOSIS — F0390 Unspecified dementia without behavioral disturbance: Secondary | ICD-10-CM | POA: Diagnosis not present

## 2016-02-27 DIAGNOSIS — Z992 Dependence on renal dialysis: Secondary | ICD-10-CM | POA: Diagnosis not present

## 2016-02-27 DIAGNOSIS — F0390 Unspecified dementia without behavioral disturbance: Secondary | ICD-10-CM | POA: Diagnosis not present

## 2016-02-27 DIAGNOSIS — N186 End stage renal disease: Secondary | ICD-10-CM | POA: Diagnosis not present

## 2016-02-28 DIAGNOSIS — F0390 Unspecified dementia without behavioral disturbance: Secondary | ICD-10-CM | POA: Diagnosis not present

## 2016-02-29 DIAGNOSIS — F0390 Unspecified dementia without behavioral disturbance: Secondary | ICD-10-CM | POA: Diagnosis not present

## 2016-03-01 DIAGNOSIS — F0390 Unspecified dementia without behavioral disturbance: Secondary | ICD-10-CM | POA: Diagnosis not present

## 2016-03-01 DIAGNOSIS — Z992 Dependence on renal dialysis: Secondary | ICD-10-CM | POA: Diagnosis not present

## 2016-03-01 DIAGNOSIS — N186 End stage renal disease: Secondary | ICD-10-CM | POA: Diagnosis not present

## 2016-03-02 ENCOUNTER — Ambulatory Visit: Payer: Commercial Managed Care - HMO | Admitting: Family Medicine

## 2016-03-02 DIAGNOSIS — F0390 Unspecified dementia without behavioral disturbance: Secondary | ICD-10-CM | POA: Diagnosis not present

## 2016-03-03 DIAGNOSIS — N186 End stage renal disease: Secondary | ICD-10-CM | POA: Diagnosis not present

## 2016-03-03 DIAGNOSIS — Z992 Dependence on renal dialysis: Secondary | ICD-10-CM | POA: Diagnosis not present

## 2016-03-03 DIAGNOSIS — F0390 Unspecified dementia without behavioral disturbance: Secondary | ICD-10-CM | POA: Diagnosis not present

## 2016-03-04 DIAGNOSIS — F0390 Unspecified dementia without behavioral disturbance: Secondary | ICD-10-CM | POA: Diagnosis not present

## 2016-03-05 DIAGNOSIS — F0390 Unspecified dementia without behavioral disturbance: Secondary | ICD-10-CM | POA: Diagnosis not present

## 2016-03-05 DIAGNOSIS — N186 End stage renal disease: Secondary | ICD-10-CM | POA: Diagnosis not present

## 2016-03-05 DIAGNOSIS — Z992 Dependence on renal dialysis: Secondary | ICD-10-CM | POA: Diagnosis not present

## 2016-03-06 DIAGNOSIS — F0390 Unspecified dementia without behavioral disturbance: Secondary | ICD-10-CM | POA: Diagnosis not present

## 2016-03-07 DIAGNOSIS — F0390 Unspecified dementia without behavioral disturbance: Secondary | ICD-10-CM | POA: Diagnosis not present

## 2016-03-08 DIAGNOSIS — N186 End stage renal disease: Secondary | ICD-10-CM | POA: Diagnosis not present

## 2016-03-08 DIAGNOSIS — Z992 Dependence on renal dialysis: Secondary | ICD-10-CM | POA: Diagnosis not present

## 2016-03-08 DIAGNOSIS — F0390 Unspecified dementia without behavioral disturbance: Secondary | ICD-10-CM | POA: Diagnosis not present

## 2016-03-09 DIAGNOSIS — F0390 Unspecified dementia without behavioral disturbance: Secondary | ICD-10-CM | POA: Diagnosis not present

## 2016-03-10 DIAGNOSIS — Z992 Dependence on renal dialysis: Secondary | ICD-10-CM | POA: Diagnosis not present

## 2016-03-10 DIAGNOSIS — F0390 Unspecified dementia without behavioral disturbance: Secondary | ICD-10-CM | POA: Diagnosis not present

## 2016-03-10 DIAGNOSIS — N186 End stage renal disease: Secondary | ICD-10-CM | POA: Diagnosis not present

## 2016-03-11 DIAGNOSIS — F0390 Unspecified dementia without behavioral disturbance: Secondary | ICD-10-CM | POA: Diagnosis not present

## 2016-03-11 DIAGNOSIS — Z992 Dependence on renal dialysis: Secondary | ICD-10-CM | POA: Diagnosis not present

## 2016-03-11 DIAGNOSIS — N186 End stage renal disease: Secondary | ICD-10-CM | POA: Diagnosis not present

## 2016-03-12 DIAGNOSIS — F0390 Unspecified dementia without behavioral disturbance: Secondary | ICD-10-CM | POA: Diagnosis not present

## 2016-03-12 DIAGNOSIS — N186 End stage renal disease: Secondary | ICD-10-CM | POA: Diagnosis not present

## 2016-03-12 DIAGNOSIS — Z992 Dependence on renal dialysis: Secondary | ICD-10-CM | POA: Diagnosis not present

## 2016-03-13 DIAGNOSIS — F0390 Unspecified dementia without behavioral disturbance: Secondary | ICD-10-CM | POA: Diagnosis not present

## 2016-03-14 DIAGNOSIS — F0390 Unspecified dementia without behavioral disturbance: Secondary | ICD-10-CM | POA: Diagnosis not present

## 2016-03-14 DIAGNOSIS — I5032 Chronic diastolic (congestive) heart failure: Secondary | ICD-10-CM | POA: Diagnosis not present

## 2016-03-14 DIAGNOSIS — R296 Repeated falls: Secondary | ICD-10-CM | POA: Diagnosis not present

## 2016-03-14 DIAGNOSIS — R269 Unspecified abnormalities of gait and mobility: Secondary | ICD-10-CM | POA: Diagnosis not present

## 2016-03-14 DIAGNOSIS — J449 Chronic obstructive pulmonary disease, unspecified: Secondary | ICD-10-CM | POA: Diagnosis not present

## 2016-03-14 DIAGNOSIS — R0602 Shortness of breath: Secondary | ICD-10-CM | POA: Diagnosis not present

## 2016-03-14 DIAGNOSIS — J9610 Chronic respiratory failure, unspecified whether with hypoxia or hypercapnia: Secondary | ICD-10-CM | POA: Diagnosis not present

## 2016-03-14 DIAGNOSIS — R2681 Unsteadiness on feet: Secondary | ICD-10-CM | POA: Diagnosis not present

## 2016-03-14 DIAGNOSIS — I509 Heart failure, unspecified: Secondary | ICD-10-CM | POA: Diagnosis not present

## 2016-03-15 DIAGNOSIS — N186 End stage renal disease: Secondary | ICD-10-CM | POA: Diagnosis not present

## 2016-03-15 DIAGNOSIS — Z992 Dependence on renal dialysis: Secondary | ICD-10-CM | POA: Diagnosis not present

## 2016-03-15 DIAGNOSIS — F0390 Unspecified dementia without behavioral disturbance: Secondary | ICD-10-CM | POA: Diagnosis not present

## 2016-03-16 DIAGNOSIS — F0390 Unspecified dementia without behavioral disturbance: Secondary | ICD-10-CM | POA: Diagnosis not present

## 2016-03-17 DIAGNOSIS — F0390 Unspecified dementia without behavioral disturbance: Secondary | ICD-10-CM | POA: Diagnosis not present

## 2016-03-17 DIAGNOSIS — N186 End stage renal disease: Secondary | ICD-10-CM | POA: Diagnosis not present

## 2016-03-17 DIAGNOSIS — Z992 Dependence on renal dialysis: Secondary | ICD-10-CM | POA: Diagnosis not present

## 2016-03-18 DIAGNOSIS — F0390 Unspecified dementia without behavioral disturbance: Secondary | ICD-10-CM | POA: Diagnosis not present

## 2016-03-19 DIAGNOSIS — N186 End stage renal disease: Secondary | ICD-10-CM | POA: Diagnosis not present

## 2016-03-19 DIAGNOSIS — Z992 Dependence on renal dialysis: Secondary | ICD-10-CM | POA: Diagnosis not present

## 2016-03-19 DIAGNOSIS — F0390 Unspecified dementia without behavioral disturbance: Secondary | ICD-10-CM | POA: Diagnosis not present

## 2016-03-20 DIAGNOSIS — F0390 Unspecified dementia without behavioral disturbance: Secondary | ICD-10-CM | POA: Diagnosis not present

## 2016-03-21 DIAGNOSIS — F0390 Unspecified dementia without behavioral disturbance: Secondary | ICD-10-CM | POA: Diagnosis not present

## 2016-03-22 DIAGNOSIS — F0390 Unspecified dementia without behavioral disturbance: Secondary | ICD-10-CM | POA: Diagnosis not present

## 2016-03-23 ENCOUNTER — Other Ambulatory Visit: Payer: Self-pay | Admitting: Family Medicine

## 2016-03-23 DIAGNOSIS — R32 Unspecified urinary incontinence: Secondary | ICD-10-CM | POA: Diagnosis not present

## 2016-03-23 DIAGNOSIS — Z992 Dependence on renal dialysis: Secondary | ICD-10-CM | POA: Diagnosis not present

## 2016-03-23 DIAGNOSIS — R159 Full incontinence of feces: Secondary | ICD-10-CM | POA: Diagnosis not present

## 2016-03-23 DIAGNOSIS — F0390 Unspecified dementia without behavioral disturbance: Secondary | ICD-10-CM | POA: Diagnosis not present

## 2016-03-23 DIAGNOSIS — N186 End stage renal disease: Secondary | ICD-10-CM | POA: Diagnosis not present

## 2016-03-24 ENCOUNTER — Other Ambulatory Visit: Payer: Self-pay | Admitting: Family Medicine

## 2016-03-24 DIAGNOSIS — N186 End stage renal disease: Secondary | ICD-10-CM | POA: Diagnosis not present

## 2016-03-24 DIAGNOSIS — F0390 Unspecified dementia without behavioral disturbance: Secondary | ICD-10-CM | POA: Diagnosis not present

## 2016-03-24 DIAGNOSIS — Z992 Dependence on renal dialysis: Secondary | ICD-10-CM | POA: Diagnosis not present

## 2016-03-25 ENCOUNTER — Other Ambulatory Visit: Payer: Self-pay | Admitting: Family Medicine

## 2016-03-25 DIAGNOSIS — F0390 Unspecified dementia without behavioral disturbance: Secondary | ICD-10-CM | POA: Diagnosis not present

## 2016-03-26 DIAGNOSIS — N186 End stage renal disease: Secondary | ICD-10-CM | POA: Diagnosis not present

## 2016-03-26 DIAGNOSIS — F0390 Unspecified dementia without behavioral disturbance: Secondary | ICD-10-CM | POA: Diagnosis not present

## 2016-03-26 DIAGNOSIS — Z992 Dependence on renal dialysis: Secondary | ICD-10-CM | POA: Diagnosis not present

## 2016-03-27 DIAGNOSIS — F0390 Unspecified dementia without behavioral disturbance: Secondary | ICD-10-CM | POA: Diagnosis not present

## 2016-03-28 DIAGNOSIS — F0390 Unspecified dementia without behavioral disturbance: Secondary | ICD-10-CM | POA: Diagnosis not present

## 2016-03-29 DIAGNOSIS — N186 End stage renal disease: Secondary | ICD-10-CM | POA: Diagnosis not present

## 2016-03-29 DIAGNOSIS — F0390 Unspecified dementia without behavioral disturbance: Secondary | ICD-10-CM | POA: Diagnosis not present

## 2016-03-29 DIAGNOSIS — Z992 Dependence on renal dialysis: Secondary | ICD-10-CM | POA: Diagnosis not present

## 2016-03-30 DIAGNOSIS — F0390 Unspecified dementia without behavioral disturbance: Secondary | ICD-10-CM | POA: Diagnosis not present

## 2016-03-31 DIAGNOSIS — Z992 Dependence on renal dialysis: Secondary | ICD-10-CM | POA: Diagnosis not present

## 2016-03-31 DIAGNOSIS — N186 End stage renal disease: Secondary | ICD-10-CM | POA: Diagnosis not present

## 2016-03-31 DIAGNOSIS — F0390 Unspecified dementia without behavioral disturbance: Secondary | ICD-10-CM | POA: Diagnosis not present

## 2016-04-01 DIAGNOSIS — F0390 Unspecified dementia without behavioral disturbance: Secondary | ICD-10-CM | POA: Diagnosis not present

## 2016-04-02 DIAGNOSIS — N186 End stage renal disease: Secondary | ICD-10-CM | POA: Diagnosis not present

## 2016-04-02 DIAGNOSIS — Z992 Dependence on renal dialysis: Secondary | ICD-10-CM | POA: Diagnosis not present

## 2016-04-02 DIAGNOSIS — F0390 Unspecified dementia without behavioral disturbance: Secondary | ICD-10-CM | POA: Diagnosis not present

## 2016-04-03 DIAGNOSIS — F0390 Unspecified dementia without behavioral disturbance: Secondary | ICD-10-CM | POA: Diagnosis not present

## 2016-04-04 DIAGNOSIS — F0390 Unspecified dementia without behavioral disturbance: Secondary | ICD-10-CM | POA: Diagnosis not present

## 2016-04-05 DIAGNOSIS — Z992 Dependence on renal dialysis: Secondary | ICD-10-CM | POA: Diagnosis not present

## 2016-04-05 DIAGNOSIS — Z79899 Other long term (current) drug therapy: Secondary | ICD-10-CM | POA: Diagnosis not present

## 2016-04-05 DIAGNOSIS — E785 Hyperlipidemia, unspecified: Secondary | ICD-10-CM | POA: Diagnosis not present

## 2016-04-05 DIAGNOSIS — F0390 Unspecified dementia without behavioral disturbance: Secondary | ICD-10-CM | POA: Diagnosis not present

## 2016-04-05 DIAGNOSIS — E119 Type 2 diabetes mellitus without complications: Secondary | ICD-10-CM | POA: Diagnosis not present

## 2016-04-05 DIAGNOSIS — N186 End stage renal disease: Secondary | ICD-10-CM | POA: Diagnosis not present

## 2016-04-06 DIAGNOSIS — F0390 Unspecified dementia without behavioral disturbance: Secondary | ICD-10-CM | POA: Diagnosis not present

## 2016-04-07 ENCOUNTER — Telehealth: Payer: Self-pay | Admitting: Family Medicine

## 2016-04-07 DIAGNOSIS — N186 End stage renal disease: Secondary | ICD-10-CM | POA: Diagnosis not present

## 2016-04-07 DIAGNOSIS — F0390 Unspecified dementia without behavioral disturbance: Secondary | ICD-10-CM | POA: Diagnosis not present

## 2016-04-07 DIAGNOSIS — Z992 Dependence on renal dialysis: Secondary | ICD-10-CM | POA: Diagnosis not present

## 2016-04-07 NOTE — Telephone Encounter (Signed)
Seth Bake requesting a D/C of tramdol the patient has requested not to take the medication. The patient doesn't want to keep paying for the medication since she isn't taken it in any longer.   CB# [phone]

## 2016-04-07 NOTE — Telephone Encounter (Signed)
To MD

## 2016-04-07 NOTE — Telephone Encounter (Signed)
Call placed to pharmacy. Last fill for tramadol noted on 03/2015. Advised that patient has not been charged form refill since 03/2015.  Call placed to facility. Spoke with Horris Latino, LPN. States that patient still has 15 tabs from last prescription. States that medication is not out of date. No refills have been requested, and no charges have been sent to patient. States that she will discuss with MT at facility, and states that D/C order for PRN is not necessary.   MD made aware.

## 2016-04-07 NOTE — Telephone Encounter (Signed)
Please call to verify this, as this is a prn medication and she should not be paying if it is not requested from the pharmacy.

## 2016-04-08 DIAGNOSIS — F0390 Unspecified dementia without behavioral disturbance: Secondary | ICD-10-CM | POA: Diagnosis not present

## 2016-04-08 NOTE — Telephone Encounter (Signed)
Received call from Seth Bake, [place] with [facility].   States that prescription for Tramadol has expired and had to be sent to pharmacy. To be in compliance with MAR, refill is needed for Tramadol, but patient does not want to refill at this time. States that if medication is not on cart, D/C order is required.   Also states that patient requires D/C order for Etodolac. States that she has not filled in >1 year and medication has also expired. Patient does not wish to refill this either.   MD please advise.

## 2016-04-08 NOTE — Telephone Encounter (Signed)
Okay, to D/C ultram and Etodolac

## 2016-04-08 NOTE — Telephone Encounter (Signed)
Order completed and awaiting signature.

## 2016-04-08 NOTE — Addendum Note (Signed)
Addended by: Sheral Flow on: 04/08/2016 04:52 PM   Modules accepted: Orders

## 2016-04-09 DIAGNOSIS — F0390 Unspecified dementia without behavioral disturbance: Secondary | ICD-10-CM | POA: Diagnosis not present

## 2016-04-09 DIAGNOSIS — N186 End stage renal disease: Secondary | ICD-10-CM | POA: Diagnosis not present

## 2016-04-09 DIAGNOSIS — Z992 Dependence on renal dialysis: Secondary | ICD-10-CM | POA: Diagnosis not present

## 2016-04-10 DIAGNOSIS — F0390 Unspecified dementia without behavioral disturbance: Secondary | ICD-10-CM | POA: Diagnosis not present

## 2016-04-10 DIAGNOSIS — N186 End stage renal disease: Secondary | ICD-10-CM | POA: Diagnosis not present

## 2016-04-10 DIAGNOSIS — Z992 Dependence on renal dialysis: Secondary | ICD-10-CM | POA: Diagnosis not present

## 2016-04-11 DIAGNOSIS — Z992 Dependence on renal dialysis: Secondary | ICD-10-CM | POA: Diagnosis not present

## 2016-04-11 DIAGNOSIS — N186 End stage renal disease: Secondary | ICD-10-CM | POA: Diagnosis not present

## 2016-04-11 DIAGNOSIS — F0390 Unspecified dementia without behavioral disturbance: Secondary | ICD-10-CM | POA: Diagnosis not present

## 2016-04-12 DIAGNOSIS — F0390 Unspecified dementia without behavioral disturbance: Secondary | ICD-10-CM | POA: Diagnosis not present

## 2016-04-12 DIAGNOSIS — N186 End stage renal disease: Secondary | ICD-10-CM | POA: Diagnosis not present

## 2016-04-12 DIAGNOSIS — Z992 Dependence on renal dialysis: Secondary | ICD-10-CM | POA: Diagnosis not present

## 2016-04-13 DIAGNOSIS — F0390 Unspecified dementia without behavioral disturbance: Secondary | ICD-10-CM | POA: Diagnosis not present

## 2016-04-14 DIAGNOSIS — N186 End stage renal disease: Secondary | ICD-10-CM | POA: Diagnosis not present

## 2016-04-14 DIAGNOSIS — Z992 Dependence on renal dialysis: Secondary | ICD-10-CM | POA: Diagnosis not present

## 2016-04-14 DIAGNOSIS — R2681 Unsteadiness on feet: Secondary | ICD-10-CM | POA: Diagnosis not present

## 2016-04-14 DIAGNOSIS — R269 Unspecified abnormalities of gait and mobility: Secondary | ICD-10-CM | POA: Diagnosis not present

## 2016-04-14 DIAGNOSIS — F0390 Unspecified dementia without behavioral disturbance: Secondary | ICD-10-CM | POA: Diagnosis not present

## 2016-04-14 DIAGNOSIS — I509 Heart failure, unspecified: Secondary | ICD-10-CM | POA: Diagnosis not present

## 2016-04-14 DIAGNOSIS — R296 Repeated falls: Secondary | ICD-10-CM | POA: Diagnosis not present

## 2016-04-14 DIAGNOSIS — I5032 Chronic diastolic (congestive) heart failure: Secondary | ICD-10-CM | POA: Diagnosis not present

## 2016-04-14 DIAGNOSIS — J9610 Chronic respiratory failure, unspecified whether with hypoxia or hypercapnia: Secondary | ICD-10-CM | POA: Diagnosis not present

## 2016-04-14 DIAGNOSIS — R0602 Shortness of breath: Secondary | ICD-10-CM | POA: Diagnosis not present

## 2016-04-14 DIAGNOSIS — J449 Chronic obstructive pulmonary disease, unspecified: Secondary | ICD-10-CM | POA: Diagnosis not present

## 2016-04-15 DIAGNOSIS — F0390 Unspecified dementia without behavioral disturbance: Secondary | ICD-10-CM | POA: Diagnosis not present

## 2016-04-16 DIAGNOSIS — Z992 Dependence on renal dialysis: Secondary | ICD-10-CM | POA: Diagnosis not present

## 2016-04-16 DIAGNOSIS — F0390 Unspecified dementia without behavioral disturbance: Secondary | ICD-10-CM | POA: Diagnosis not present

## 2016-04-16 DIAGNOSIS — N186 End stage renal disease: Secondary | ICD-10-CM | POA: Diagnosis not present

## 2016-04-17 DIAGNOSIS — F0390 Unspecified dementia without behavioral disturbance: Secondary | ICD-10-CM | POA: Diagnosis not present

## 2016-04-18 DIAGNOSIS — F0390 Unspecified dementia without behavioral disturbance: Secondary | ICD-10-CM | POA: Diagnosis not present

## 2016-04-19 DIAGNOSIS — F0390 Unspecified dementia without behavioral disturbance: Secondary | ICD-10-CM | POA: Diagnosis not present

## 2016-04-19 DIAGNOSIS — Z992 Dependence on renal dialysis: Secondary | ICD-10-CM | POA: Diagnosis not present

## 2016-04-19 DIAGNOSIS — N186 End stage renal disease: Secondary | ICD-10-CM | POA: Diagnosis not present

## 2016-04-20 ENCOUNTER — Ambulatory Visit: Payer: Commercial Managed Care - HMO | Admitting: Family Medicine

## 2016-04-20 DIAGNOSIS — F0390 Unspecified dementia without behavioral disturbance: Secondary | ICD-10-CM | POA: Diagnosis not present

## 2016-04-20 DIAGNOSIS — H34812 Central retinal vein occlusion, left eye, with macular edema: Secondary | ICD-10-CM | POA: Diagnosis not present

## 2016-04-21 DIAGNOSIS — F0390 Unspecified dementia without behavioral disturbance: Secondary | ICD-10-CM | POA: Diagnosis not present

## 2016-04-21 DIAGNOSIS — Z992 Dependence on renal dialysis: Secondary | ICD-10-CM | POA: Diagnosis not present

## 2016-04-21 DIAGNOSIS — N186 End stage renal disease: Secondary | ICD-10-CM | POA: Diagnosis not present

## 2016-04-22 ENCOUNTER — Emergency Department (HOSPITAL_COMMUNITY): Payer: Medicare HMO

## 2016-04-22 ENCOUNTER — Encounter (HOSPITAL_COMMUNITY): Payer: Self-pay | Admitting: *Deleted

## 2016-04-22 ENCOUNTER — Ambulatory Visit (INDEPENDENT_AMBULATORY_CARE_PROVIDER_SITE_OTHER): Payer: Medicare HMO | Admitting: Family Medicine

## 2016-04-22 ENCOUNTER — Encounter: Payer: Self-pay | Admitting: Family Medicine

## 2016-04-22 ENCOUNTER — Emergency Department (HOSPITAL_COMMUNITY)
Admission: EM | Admit: 2016-04-22 | Discharge: 2016-04-22 | Disposition: A | Discharge status: Home / Self Care | Payer: Medicare HMO | Attending: Emergency Medicine | Admitting: Emergency Medicine

## 2016-04-22 ENCOUNTER — Other Ambulatory Visit: Payer: Self-pay | Admitting: Family Medicine

## 2016-04-22 VITALS — BP 120/64 | HR 74 | Temp 98.6°F | Resp 22 | Wt 191.0 lb

## 2016-04-22 DIAGNOSIS — Z79899 Other long term (current) drug therapy: Secondary | ICD-10-CM | POA: Diagnosis not present

## 2016-04-22 DIAGNOSIS — N186 End stage renal disease: Secondary | ICD-10-CM | POA: Insufficient documentation

## 2016-04-22 DIAGNOSIS — Z992 Dependence on renal dialysis: Secondary | ICD-10-CM

## 2016-04-22 DIAGNOSIS — I132 Hypertensive heart and chronic kidney disease with heart failure and with stage 5 chronic kidney disease, or end stage renal disease: Secondary | ICD-10-CM | POA: Insufficient documentation

## 2016-04-22 DIAGNOSIS — E039 Hypothyroidism, unspecified: Secondary | ICD-10-CM | POA: Diagnosis not present

## 2016-04-22 DIAGNOSIS — E1122 Type 2 diabetes mellitus with diabetic chronic kidney disease: Secondary | ICD-10-CM | POA: Diagnosis not present

## 2016-04-22 DIAGNOSIS — J441 Chronic obstructive pulmonary disease with (acute) exacerbation: Secondary | ICD-10-CM | POA: Diagnosis not present

## 2016-04-22 DIAGNOSIS — R0602 Shortness of breath: Secondary | ICD-10-CM | POA: Diagnosis not present

## 2016-04-22 DIAGNOSIS — I5032 Chronic diastolic (congestive) heart failure: Secondary | ICD-10-CM

## 2016-04-22 DIAGNOSIS — F0390 Unspecified dementia without behavioral disturbance: Secondary | ICD-10-CM | POA: Diagnosis not present

## 2016-04-22 LAB — HEPATIC FUNCTION PANEL
ALBUMIN: 3.7 g/dL (ref 3.5–5.0)
ALT: 13 U/L — ABNORMAL LOW (ref 14–54)
AST: 19 U/L (ref 15–41)
Alkaline Phosphatase: 102 U/L (ref 38–126)
BILIRUBIN INDIRECT: 0.2 mg/dL — AB (ref 0.3–0.9)
Bilirubin, Direct: 0.1 mg/dL (ref 0.1–0.5)
TOTAL PROTEIN: 6.6 g/dL (ref 6.5–8.1)
Total Bilirubin: 0.3 mg/dL (ref 0.3–1.2)

## 2016-04-22 LAB — CBC WITH DIFFERENTIAL/PLATELET
BASOS ABS: 0 10*3/uL (ref 0.0–0.1)
Basophils Relative: 0 %
EOS ABS: 0 10*3/uL (ref 0.0–0.7)
Eosinophils Relative: 0 %
HCT: 29 % — ABNORMAL LOW (ref 36.0–46.0)
HEMOGLOBIN: 9.5 g/dL — AB (ref 12.0–15.0)
Lymphocytes Relative: 31 %
Lymphs Abs: 1.8 10*3/uL (ref 0.7–4.0)
MCH: 26 pg (ref 26.0–34.0)
MCHC: 32.8 g/dL (ref 30.0–36.0)
MCV: 79.5 fL (ref 78.0–100.0)
MONO ABS: 0.5 10*3/uL (ref 0.1–1.0)
MONOS PCT: 9 %
NEUTROS ABS: 3.5 10*3/uL (ref 1.7–7.7)
NEUTROS PCT: 60 %
Platelets: 151 10*3/uL (ref 150–400)
RBC: 3.65 MIL/uL — ABNORMAL LOW (ref 3.87–5.11)
RDW: 15.2 % (ref 11.5–15.5)
WBC: 5.9 10*3/uL (ref 4.0–10.5)

## 2016-04-22 LAB — BASIC METABOLIC PANEL
ANION GAP: 10 (ref 5–15)
BUN: 45 mg/dL — ABNORMAL HIGH (ref 6–20)
CALCIUM: 8.6 mg/dL — AB (ref 8.9–10.3)
CO2: 31 mmol/L (ref 22–32)
CREATININE: 6.05 mg/dL — AB (ref 0.44–1.00)
Chloride: 97 mmol/L — ABNORMAL LOW (ref 101–111)
GFR calc non Af Amer: 6 mL/min — ABNORMAL LOW (ref >60)
GFR, EST AFRICAN AMERICAN: 6 mL/min — AB (ref >60)
Glucose, Bld: 86 mg/dL (ref 65–99)
Potassium: 3.8 mmol/L (ref 3.5–5.1)
SODIUM: 138 mmol/L (ref 135–145)

## 2016-04-22 LAB — BRAIN NATRIURETIC PEPTIDE: B Natriuretic Peptide: 1004 pg/mL — ABNORMAL HIGH (ref 0.0–100.0)

## 2016-04-22 LAB — TROPONIN I: TROPONIN I: 0.04 ng/mL — AB (ref <0.03)

## 2016-04-22 MED ORDER — ALBUTEROL SULFATE (2.5 MG/3ML) 0.083% IN NEBU
2.5000 mg | INHALATION_SOLUTION | Freq: Once | RESPIRATORY_TRACT | Status: AC
  Administered 2016-04-22: 2.5 mg via RESPIRATORY_TRACT
  Filled 2016-04-22: qty 3

## 2016-04-22 MED ORDER — PREDNISONE 10 MG PO TABS: 20.0000 mg | ORAL_TABLET | Freq: Every day | ORAL | 0 refills | Status: DC

## 2016-04-22 MED ORDER — IPRATROPIUM-ALBUTEROL 0.5-2.5 (3) MG/3ML IN SOLN
3.0000 mL | Freq: Once | RESPIRATORY_TRACT | Status: AC
  Administered 2016-04-22: 3 mL via RESPIRATORY_TRACT
  Filled 2016-04-22: qty 3

## 2016-04-22 MED ORDER — PREDNISONE 20 MG PO TABS: ORAL_TABLET | ORAL | 0 refills | Status: DC

## 2016-04-22 MED ORDER — IPRATROPIUM-ALBUTEROL 0.5-2.5 (3) MG/3ML IN SOLN
3.0000 mL | Freq: Once | RESPIRATORY_TRACT | Status: AC
  Administered 2016-04-22: 3 mL via RESPIRATORY_TRACT

## 2016-04-22 MED ORDER — METHYLPREDNISOLONE SODIUM SUCC 125 MG IJ SOLR
125.0000 mg | Freq: Once | INTRAMUSCULAR | Status: AC
  Administered 2016-04-22: 125 mg via INTRAVENOUS
  Filled 2016-04-22: qty 2

## 2016-04-22 NOTE — ED Notes (Signed)
CRITICAL VALUE ALERT  Critical value received:  Troponin 0.04  Date of notification:  04/23/46  Time of notification:  7001  Critical value read back:Yes.    Nurse who received alert:  Theola Sequin RN  MD notified (1st page): Zammit MD  Time of first page:  1336

## 2016-04-22 NOTE — ED Provider Notes (Signed)
[facility] DEPT Provider Note   CSN: [id number] Arrival date & time: 04/22/16  1152     History   Chief Complaint Chief Complaint  Patient presents with  . Shortness of Breath    HPI [name] is a 83 y.o. female.  Patient complains of some shortness of breath. She was seen at the family practice office had some wheezing and neb treatment that helped some he sent her over here for continued care   The history is provided by the patient.  Shortness of Breath  This is a new problem. The problem occurs intermittently.The current episode started 2 days ago. The problem has been gradually improving. Pertinent negatives include no fever, no headaches, no cough, no chest pain, no abdominal pain and no rash. She has tried ipratropium inhalers for the symptoms. The treatment provided mild relief.    Past Medical History:  Diagnosis Date  . Anemia   . Anemia in CKD (chronic kidney disease)   . Anxiety   . Arthritis    gout, right shoulder  . Cancer [facility])    ?stomach cancer   . Cataracts, both eyes   . CHF (congestive heart failure) ([place])   . CKD (chronic kidney disease), stage V ([place])    on HD on M/W/F  . Depression   . Diabetes mellitus, type 2 ([employer])   . GERD (gastroesophageal reflux disease)   . Gynecologic cancer   . Hyperlipidemia   . Hypertension   . IBS (irritable bowel syndrome)    with costipation  . Intraoperative hemorrhage and hematoma of a digestive system organ or structure complicating a digestive system procedure   . Overactive bladder   . Pneumonia    2 times in 2016  . SOB (shortness of breath)    nml spirometry 09/05/06  . Stroke [facility])    residual right side weakness and pain  . SVT (supraventricular tachycardia) ([place])   . Syncope and collapse   . Thyroid disease     Patient Active Problem List   Diagnosis Date Noted  . Chronic diastolic heart failure ([place] ) 10/07/2015  . Fluid overload 10/02/2015  . Hyperglycemia 10/02/2015  .  D-dimer, elevated   . Depression   . Pseudoaneurysm of arteriovenous dialysis fistula ([place]) 05/29/2015  . Traumatic hematoma of scalp 02/12/2014  . Decreased hearing of both ears 02/12/2014  . At high risk for falls 02/12/2014  . Leg swelling 09/18/2013  . OA (osteoarthritis) of knee 09/18/2013  . MDD (major depressive disorder) ([place]) 04/05/2013  . Mechanical complication of other vascular device, implant, and graft 11/14/2012  . Pain in limb-Left arm 07/11/2012  . Hypothyroidism 06/08/2012  . Heartburn 03/23/2012  . Gait instability 01/13/2012  . Neuropathy of hand 01/13/2012  . Breast mass, right 07/11/2011  . Hemorrhoid 03/14/2011  . ESRD (end stage renal disease) on dialysis ([place]) 02/12/2011  . Bradycardia 02/01/2011  . Dyspnea 01/17/2011  . Chronic respiratory failure ([place]) 01/13/2011  . Constipation 12/09/2010  . Insomnia 10/14/2010  . Diabetes mellitus with renal complications ([place]) 07/37/1062  . ALLERGIC RHINITIS 04/03/2008  . Benign hypertensive heart and kidney disease and CKD stage V ([place]) 08/07/2007  . DEPENDENT EDEMA, LEGS, BILATERAL 11/17/2006  . HYPERLIPIDEMIA 10/10/2006  . ANEMIA OF RENAL FAILURE 10/10/2006  . ANXIETY 09/05/2006  . CATARACT NOS 09/05/2006  . IBS 09/05/2006  . ARTHRITIS 09/05/2006    Past Surgical History:  Procedure Laterality Date  . AV FISTULA PLACEMENT, BRACHIOCEPHALIC  69/48/5462   left arm  by Dr. Bridgett Larsson  . COLONOSCOPY    . FISTULA SUPERFICIALIZATION Left 06/10/2015   Procedure: LEFT UPPER ARM BRACHIOCEPHALIC FISTULA PSEUDOANEURYSM PLICATION;  Surgeon: Conrad Ward, MD;  Location: [place];  Service: Vascular;  Laterality: Left;  . PERIPHERAL VASCULAR CATHETERIZATION Left 05/15/2015   Procedure: Fistulagram;  Surgeon: Conrad Leachville, MD;  Location: [facility];  Service: Cardiovascular;  Laterality: Left;  . TEE WITHOUT CARDIOVERSION  2011   2/2 Strep Bovis infection  . VESICOVAGINAL FISTULA CLOSURE W/ TAH      OB History    No  data available       Home Medications    Prior to Admission medications   Medication Sig Start Date End Date Taking? Authorizing Provider  albuterol (PROVENTIL) (2.5 MG/3ML) 0.083% nebulizer solution USE 1 VIAL IN NEBULIZER 3 TIMES DAILY AS NEEDED FOR WHEEZING/SHORTNESS OF BREATH. 10/01/15  Yes Alycia Rossetti, MD  amLODipine (NORVASC) 10 MG tablet TAKE 1 TABLET BY MOUTH EVERY MORNING. 03/24/16  Yes Alycia Rossetti, MD  bisacodyl (DULCOLAX) 10 MG suppository 1 suppository rectally  Daily every 3 days as needed for constipation 10/17/15  Yes Alycia Rossetti, MD  citalopram (CELEXA) 20 MG tablet TAKE (1) TABLET BY MOUTH ONCE IN THE MORNING. 03/24/16  Yes Alycia Rossetti, MD  guaiFENesin-dextromethorphan (ROBITUSSIN DM) 100-10 MG/5ML syrup TAKE ONE TEASPOONFUL BY MOUTH EVERY 4 HOURS AS NEEDED FOR COUGH 02/17/16  Yes Alycia Rossetti, MD  ipratropium (ATROVENT) 0.02 % nebulizer solution USE 1 VIAL IN NEBULIZER 3 TIMES DAILY AS NEEDED FOR WHEEZING/SHORTNESS OF BREATH. 10/01/15  Yes Alycia Rossetti, MD  isosorbide mononitrate (IMDUR) 60 MG 24 hr tablet TAKE (1) TABLET BY MOUTH ONCE DAILY. 03/24/16  Yes Alycia Rossetti, MD  Lancets MISC Use as directed to monitor FSBS 1x daily x2 weeks, then 1x daily PRN S/Sx hypoglycemia. Dx: E11.9. 10/09/15  Yes Alycia Rossetti, MD  levothyroxine (SYNTHROID, LEVOTHROID) 25 MCG tablet TAKE (1) TABLET BY MOUTH ONCE DAILY BEFORE BREAKFAST. 03/24/16  Yes Alycia Rossetti, MD  lidocaine-prilocaine (EMLA) cream APPLY TOPICALLY TO LEFT UPPER ARM 1 HOUR PRIOR TO DIALYSIS ON MONDAY,WEDNESDAY & FRIDAYS AS NEEDED FOR PAIN. WRAP WITH PLASTIC WRAP AFTER A 03/23/16  Yes Alycia Rossetti, MD  MAPAP 325 MG tablet TAKE (2) TABLETS FOUR TIMES DAILY AS NEEDED FOR PAIN. -MAY MAKE DROWSY - 04/22/16  Yes Alycia Rossetti, MD  mirtazapine (REMERON) 15 MG tablet TAKE 1/2 TABLET (7.5MG ) BY MOUTH AT BEDTIME FOR MOOD/APPETITE. 03/24/16  Yes Alycia Rossetti, MD  Multiple Vitamin (DAILY-VITE) TABS  TAKE 1 TABLET BY MOUTH ONCE DAILY. (ON DAY OF DIALYSIS, TAKE AFTER DIALYSIS) 03/24/16  Yes Alycia Rossetti, MD  nitroGLYCERIN (NITROSTAT) 0.4 MG SL tablet DISSOLVE 1 TABLET UNDER THE TONGUE EVERY 5 MINUTES X2 DOSES THEN TO ER 06/06/15  Yes Alycia Rossetti, MD  OXYGEN Inhale 2 L into the lungs daily as needed (shortness of breath).   Yes Historical Provider, MD  polyethylene glycol powder (MIRALAX) powder Take 17 g by mouth daily. Hold for diarrhea 02/28/15  Yes Alycia Rossetti, MD  polyvinyl alcohol (LIQUIFILM TEARS) 1.4 % ophthalmic solution 1 drop as needed for dry eyes.   Yes Historical Provider, MD  QC LO-DOSE ASPIRIN 81 MG EC tablet TAKE (1) TABLET BY MOUTH ONCE DAILY. 03/24/16  Yes Alycia Rossetti, MD  ranitidine (ZANTAC) 150 MG tablet TAKE 1 TABLET BY MOUTH AT BEDTIME. 03/24/16  Yes Alycia Rossetti, MD  SENSIPAR 30 MG tablet TAKE 1 TABLET BY MOUTH ONCE DAILY AFTER THE EVENING MEAL 08/19/15  Yes Alycia Rossetti, MD  sevelamer (RENVELA) 800 MG tablet Take 800-2,400 mg by mouth 2 (two) times daily. Take 1 tablet twice a day, 3 tablets with meals   Yes Historical Provider, MD  STOOL SOFTENER 100 MG capsule TAKE 1 CAPSULE BY MOUTH TWICE DAILY. 03/24/16  Yes Alycia Rossetti, MD  Travoprost, BAK Free, (TRAVATAN) 0.004 % SOLN ophthalmic solution Place 1 drop into both eyes at bedtime. 02/18/16  Yes Alycia Rossetti, MD  VENTOLIN HFA 108 [phone] Base) MCG/ACT inhaler INHALE (2) PUFFS EVERY 4 HOURS AS NEEDED FOR WHEEZING OR SHORTNESS OFBREATH(COUGH). 03/25/16  Yes Alycia Rossetti, MD  predniSONE (DELTASONE) 10 MG tablet Take 2 tablets (20 mg total) by mouth daily. 04/22/16   Milton Ferguson, MD    Family History History reviewed. No pertinent family history.  Social History Social History  Substance Use Topics  . Smoking status: Never Smoker  . Smokeless tobacco: Never Used  . Alcohol use No     Allergies   Patient has no known allergies.   Review of Systems Review of Systems  Constitutional:  Negative for appetite change, fatigue and fever.  HENT: Negative for congestion, ear discharge and sinus pressure.   Eyes: Negative for discharge.  Respiratory: Positive for shortness of breath. Negative for cough.   Cardiovascular: Negative for chest pain.  Gastrointestinal: Negative for abdominal pain and diarrhea.  Genitourinary: Negative for frequency and hematuria.  Musculoskeletal: Negative for back pain.  Skin: Negative for rash.  Neurological: Negative for seizures and headaches.  Psychiatric/Behavioral: Negative for hallucinations.     Physical Exam Updated Vital Signs BP (!) 175/87   Pulse 63   Resp 14   LMP 12/24/2010   SpO2 96%   Physical Exam  Constitutional: She is oriented to person, place, and time. She appears well-developed.  HENT:  Head: Normocephalic.  Eyes: Conjunctivae and EOM are normal. No scleral icterus.  Neck: Neck supple. No thyromegaly present.  Cardiovascular: Normal rate and regular rhythm.  Exam reveals no gallop and no friction rub.   No murmur heard. Pulmonary/Chest: No stridor. She has wheezes. She has no rales. She exhibits no tenderness.  Abdominal: She exhibits no distension. There is no tenderness. There is no rebound.  Musculoskeletal: Normal range of motion. She exhibits no edema.  Lymphadenopathy:    She has no cervical adenopathy.  Neurological: She is oriented to person, place, and time. She exhibits normal muscle tone. Coordination normal.  Skin: No rash noted. No erythema.  Psychiatric: She has a normal mood and affect. Her behavior is normal.     ED Treatments / Results  Labs (all labs ordered are listed, but only abnormal results are displayed) Labs Reviewed  TROPONIN I - Abnormal; Notable for the following:       Result Value   Troponin I 0.04 (*)    All other components within normal limits  CBC WITH DIFFERENTIAL/PLATELET - Abnormal; Notable for the following:    RBC 3.65 (*)    Hemoglobin 9.5 (*)    HCT 29.0 (*)      All other components within normal limits  BASIC METABOLIC PANEL - Abnormal; Notable for the following:    Chloride 97 (*)    BUN 45 (*)    Creatinine, Ser 6.05 (*)    Calcium 8.6 (*)    GFR calc non Af Amer 6 (*)  GFR calc Af Amer 6 (*)    All other components within normal limits  BRAIN NATRIURETIC PEPTIDE - Abnormal; Notable for the following:    B Natriuretic Peptide 1,004.0 (*)    All other components within normal limits  HEPATIC FUNCTION PANEL - Abnormal; Notable for the following:    ALT 13 (*)    Indirect Bilirubin 0.2 (*)    All other components within normal limits    EKG  EKG Interpretation None       Radiology Dg Chest Portable 1 View  Result Date: 04/22/2016 CLINICAL DATA:  Shortness of breath since yesterday. EXAM: PORTABLE CHEST 1 VIEW COMPARISON:  12/11/2015 FINDINGS: Chronic cardiomegaly. Chronic aortic atherosclerosis. Poor inspiration. Possible pulmonary venous hypertension without frank edema. No visible effusion. IMPRESSION: Chronic cardiomegaly and aortic atherosclerosis. Possible pulmonary venous hypertension. Electronically Signed   By: Nelson Chimes M.D.   On: 04/22/2016 12:27    Procedures Procedures (including critical care time)  Medications Ordered in ED Medications  methylPREDNISolone sodium succinate (SOLU-MEDROL) 125 mg/2 mL injection 125 mg (125 mg Intravenous Given 04/22/16 1231)  ipratropium-albuterol (DUONEB) 0.5-2.5 (3) MG/3ML nebulizer solution 3 mL (3 mLs Nebulization Given 04/22/16 1521)  albuterol (PROVENTIL) (2.5 MG/3ML) 0.083% nebulizer solution 2.5 mg (2.5 mg Nebulization Given 04/22/16 1521)     Initial Impression / Assessment and Plan / ED Course  I have reviewed the triage vital signs and the nursing notes.  Pertinent labs & imaging results that were available during my care of the patient were reviewed by me and considered in my medical decision making (see chart for details).     Patient with some shortness of breath  improved with neb treatments. All her labs seem to be stable for her. She has renal failure and congestive heart failure and is suspected to have some COPD by her doctor. Patient's oxygen has been 100% all time she's been here she has minimal wheezing that has improved with neb treatments she will be discharged home on some prednisone and is to get dialysis tomorrow morning  Final Clinical Impressions(s) / ED Diagnoses   Final diagnoses:  COPD exacerbation (HCC)    New Prescriptions New Prescriptions   PREDNISONE (DELTASONE) 10 MG TABLET    Take 2 tablets (20 mg total) by mouth daily.     Milton Ferguson, MD 04/22/16 [phone]

## 2016-04-22 NOTE — ED Notes (Signed)
Two unsuccessful IV attempts made by this RN.

## 2016-04-22 NOTE — Progress Notes (Signed)
Subjective:    Patient ID: [name], female    DOB: 10/19/1925, 83 y.o.   MRN: [id number]  HPI Patient is a very pleasant 83 year old African-American female who is new to me who presents with worsening shortness of breath over the last 4 days. She denies any chest pain but she does report feeling tightness in her chest. Patient had a nuclear stress test in May 2017 it was reportedly normal. Echocardiogram performed in May of last year revealed normal systolic function but did reveal diastolic dysfunction. The patient is on 3 day a week dialysis. She is not fluid overloaded on exam. There is no pitting edema. Her lungs are clear of pulmonary edema to auscultation. There is no JVD. Therefore I do not believe her shortness of breath is cardiac in nature. However she does have diminished breath sounds bilaterally with some faint expiratory wheezing. At the nursing home where she stays last night she was given a nebulizer treatment and she states that that did help her breathing temporarily. However, she refused to go to the ER last night.  I performed a peak flow today and was 150 mL. The patient was given 2.5 mg albuterol and 0.5 mg of Atrovent 1 and peak flow came up to 250 mL indicating a substantial reactive component. She does not have a documented history of COPD or emphysema but her history today suggests that. She denies any fevers or chills. She denies any hemoptysis Past Medical History:  Diagnosis Date  . Anemia   . Anemia in CKD (chronic kidney disease)   . Anxiety   . Arthritis    gout, right shoulder  . Cancer [facility])    ?stomach cancer   . Cataracts, both eyes   . CHF (congestive heart failure) ([place])   . CKD (chronic kidney disease), stage V ([place])    on HD on M/W/F  . Depression   . Diabetes mellitus, type 2 ([place])   . GERD (gastroesophageal reflux disease)   . Gynecologic cancer   . Hyperlipidemia   . Hypertension   . IBS (irritable bowel syndrome)    with costipation    . Intraoperative hemorrhage and hematoma of a digestive system organ or structure complicating a digestive system procedure   . Overactive bladder   . Pneumonia    2 times in 2016  . SOB (shortness of breath)    nml spirometry 09/05/06  . Stroke [facility])    residual right side weakness and pain  . SVT (supraventricular tachycardia) ([place])   . Syncope and collapse   . Thyroid disease    Past Surgical History:  Procedure Laterality Date  . AV FISTULA PLACEMENT, BRACHIOCEPHALIC  32/67/1245   left arm   by Dr. Bridgett Larsson  . COLONOSCOPY    . FISTULA SUPERFICIALIZATION Left 06/10/2015   Procedure: LEFT UPPER ARM BRACHIOCEPHALIC FISTULA PSEUDOANEURYSM PLICATION;  Surgeon: Conrad Allerton, MD;  Location: [facility];  Service: Vascular;  Laterality: Left;  . PERIPHERAL VASCULAR CATHETERIZATION Left 05/15/2015   Procedure: Fistulagram;  Surgeon: Conrad Neihart, MD;  Location: [facility] [facility];  Service: Cardiovascular;  Laterality: Left;  . TEE WITHOUT CARDIOVERSION  2011   2/2 Strep Bovis infection  . VESICOVAGINAL FISTULA CLOSURE W/ TAH     Current Outpatient Prescriptions on File Prior to Visit  Medication Sig Dispense Refill  . albuterol (PROVENTIL) (2.5 MG/3ML) 0.083% nebulizer solution USE 1 VIAL IN NEBULIZER 3 TIMES DAILY AS NEEDED FOR WHEEZING/SHORTNESS OF BREATH. 360 mL 0  .  amLODipine (NORVASC) 10 MG tablet TAKE 1 TABLET BY MOUTH EVERY MORNING. 31 tablet 11  . bisacodyl (DULCOLAX) 10 MG suppository 1 suppository rectally  Daily every 3 days as needed for constipation 12 suppository 2  . citalopram (CELEXA) 20 MG tablet TAKE (1) TABLET BY MOUTH ONCE IN THE MORNING. 31 tablet 11  . guaiFENesin-dextromethorphan (ROBITUSSIN DM) 100-10 MG/5ML syrup TAKE ONE TEASPOONFUL BY MOUTH EVERY 4 HOURS AS NEEDED FOR COUGH 120 mL 2  . ipratropium (ATROVENT) 0.02 % nebulizer solution USE 1 VIAL IN NEBULIZER 3 TIMES DAILY AS NEEDED FOR WHEEZING/SHORTNESS OF BREATH. 360 mL 0  . isosorbide mononitrate (IMDUR) 60 MG 24 hr  tablet TAKE (1) TABLET BY MOUTH ONCE DAILY. 31 tablet 11  . Lancets MISC Use as directed to monitor FSBS 1x daily x2 weeks, then 1x daily PRN S/Sx hypoglycemia. Dx: E11.9. 50 each 1  . levothyroxine (SYNTHROID, LEVOTHROID) 25 MCG tablet TAKE (1) TABLET BY MOUTH ONCE DAILY BEFORE BREAKFAST. 31 tablet 11  . lidocaine-prilocaine (EMLA) cream APPLY TOPICALLY TO LEFT UPPER ARM 1 HOUR PRIOR TO DIALYSIS ON MONDAY,WEDNESDAY & FRIDAYS AS NEEDED FOR PAIN. WRAP WITH PLASTIC WRAP AFTER A 30 g 11  . mirtazapine (REMERON) 15 MG tablet TAKE 1/2 TABLET (7.5MG ) BY MOUTH AT BEDTIME FOR MOOD/APPETITE. 16 tablet 11  . Multiple Vitamin (DAILY-VITE) TABS TAKE 1 TABLET BY MOUTH ONCE DAILY. (ON DAY OF DIALYSIS, TAKE AFTER DIALYSIS) 31 tablet 11  . nitroGLYCERIN (NITROSTAT) 0.4 MG SL tablet DISSOLVE 1 TABLET UNDER THE TONGUE EVERY 5 MINUTES X2 DOSES THEN TO ER 25 tablet 0  . OXYGEN Inhale 2 L into the lungs daily as needed (shortness of breath).    . polyethylene glycol powder (MIRALAX) powder Take 17 g by mouth daily. Hold for diarrhea 255 g 6  . predniSONE (DELTASONE) 5 MG tablet Take 1 tablet (5 mg) daily for 5 days and then stop 5 tablet 0  . QC LO-DOSE ASPIRIN 81 MG EC tablet TAKE (1) TABLET BY MOUTH ONCE DAILY. 31 tablet 11  . ranitidine (ZANTAC) 150 MG tablet TAKE 1 TABLET BY MOUTH AT BEDTIME. 31 tablet 11  . SENSIPAR 30 MG tablet TAKE 1 TABLET BY MOUTH ONCE DAILY AFTER THE EVENING MEAL 31 tablet PRN  . sevelamer (RENVELA) 800 MG tablet Take 800-2,400 mg by mouth 2 (two) times daily. Take 1 tablet twice a day, 3 tablets with meals    . STOOL SOFTENER 100 MG capsule TAKE 1 CAPSULE BY MOUTH TWICE DAILY. 62 capsule 11  . Travoprost, BAK Free, (TRAVATAN) 0.004 % SOLN ophthalmic solution Place 1 drop into both eyes at bedtime. 5 mL 11  . VENTOLIN HFA 108 (90 Base) MCG/ACT inhaler INHALE (2) PUFFS EVERY 4 HOURS AS NEEDED FOR WHEEZING OR SHORTNESS OFBREATH(COUGH). 18 g 11   No current facility-administered medications on  file prior to visit.    No Known Allergies Social History   Social History  . Marital status: Widowed    Spouse name: N/A  . Number of children: N/A  . Years of education: N/A   Occupational History  . Not on file.   Social History Main Topics  . Smoking status: Never Smoker  . Smokeless tobacco: Never Used  . Alcohol use No  . Drug use: No  . Sexual activity: No   Other Topics Concern  . Not on file   Social History Narrative  . No narrative on file      Review of Systems  All other systems reviewed  and are negative.      Objective:   Physical Exam  Neck: No JVD present.  Cardiovascular: Normal rate, regular rhythm and normal heart sounds.   No murmur heard. Pulmonary/Chest: Effort normal. No respiratory distress. She has decreased breath sounds in the right upper field, the right lower field, the left upper field and the left lower field. She has wheezes in the right upper field and the left upper field. She has no rhonchi. She has no rales.  Abdominal: Soft. Bowel sounds are normal.  Musculoskeletal: She exhibits no edema.  Vitals reviewed.         Assessment & Plan:  SOB (shortness of breath) on exertion - Plan: ipratropium-albuterol (DUONEB) 0.5-2.5 (3) MG/3ML nebulizer solution 3 mL  Shortness of breath - Plan: DG Chest 2 View  Chronic diastolic congestive heart failure (HCC)  ESRD (end stage renal disease) on dialysis [facility])  A complicated patient with multiple medical comorbidities.  I will check a CBC to rule out worsening anemia. She does have anemia of chronic disease but her last hemoglobin was 10.0 in November. Her exam shows no evidence of fluid overload. I will check a chest x-ray to rule out pneumonia and rule out malaria edema but I do not appreciate that on exam. Rather her exam suggests COPD exacerbation/reactive airway disease. Patient was given a DuoNeb 1 in clinic. I will reevaluate the patient and 15 minutes. If she feels better after  the DuraNeb, I will start the patient on a prednisone taper pack for reactive airway disease and albuterol every 6 hours as needed for shortness of breath with close follow-up tomorrow with her primary care physician.  EKG was obtained today which reveals normal sinus rhythm with first-degree AV block. She does have criteria for LVH. There is diffuse nonspecific ST changes with T-wave inversions in V5 and V6.  These are chronic and unchanged compared to her EKG from November 2017. There are NO new acute changes.   After the patient received a breathing treatment, she states that she feels no better. She is working harder to breathe now. She is not able to speak in complete sentences without having to take a deep breath. She is still wheezing on exam with diminished breath sounds. The patient requires prednisone and breathing treatments however I do not feel comfortable given her age and multiple medical comorbidities to discharge the patient back to the SNF with follow-up tomorrow. She needs to go the emergency room where she can receive IV glucocorticoids and nebulizer treatments every 4 hours until better. She'll go directly to emergency room at [facility] by personal transport.

## 2016-04-22 NOTE — ED Triage Notes (Signed)
No distress noted at this time. Pt was able to get self out of chair to wheelchair.

## 2016-04-22 NOTE — ED Notes (Signed)
Respiratory paged at this time.

## 2016-04-22 NOTE — ED Triage Notes (Signed)
Reported that pt with sob since yesterday, refused to go to er yesterday.

## 2016-04-22 NOTE — Discharge Instructions (Signed)
Follow-up tomorrow to get to dialysis. Usually her inhaler as needed for shortness of breath

## 2016-04-22 NOTE — ED Triage Notes (Signed)
Seen at PCP today in [place].  Pt is resident at [facility] in [place].

## 2016-04-23 DIAGNOSIS — F0390 Unspecified dementia without behavioral disturbance: Secondary | ICD-10-CM | POA: Diagnosis not present

## 2016-04-23 DIAGNOSIS — Z992 Dependence on renal dialysis: Secondary | ICD-10-CM | POA: Diagnosis not present

## 2016-04-23 DIAGNOSIS — N186 End stage renal disease: Secondary | ICD-10-CM | POA: Diagnosis not present

## 2016-04-24 DIAGNOSIS — F0390 Unspecified dementia without behavioral disturbance: Secondary | ICD-10-CM | POA: Diagnosis not present

## 2016-04-25 DIAGNOSIS — F0390 Unspecified dementia without behavioral disturbance: Secondary | ICD-10-CM | POA: Diagnosis not present

## 2016-04-26 DIAGNOSIS — F0390 Unspecified dementia without behavioral disturbance: Secondary | ICD-10-CM | POA: Diagnosis not present

## 2016-04-26 DIAGNOSIS — N186 End stage renal disease: Secondary | ICD-10-CM | POA: Diagnosis not present

## 2016-04-26 DIAGNOSIS — Z992 Dependence on renal dialysis: Secondary | ICD-10-CM | POA: Diagnosis not present

## 2016-04-27 DIAGNOSIS — F0390 Unspecified dementia without behavioral disturbance: Secondary | ICD-10-CM | POA: Diagnosis not present

## 2016-04-28 DIAGNOSIS — Z992 Dependence on renal dialysis: Secondary | ICD-10-CM | POA: Diagnosis not present

## 2016-04-28 DIAGNOSIS — F0390 Unspecified dementia without behavioral disturbance: Secondary | ICD-10-CM | POA: Diagnosis not present

## 2016-04-28 DIAGNOSIS — N186 End stage renal disease: Secondary | ICD-10-CM | POA: Diagnosis not present

## 2016-04-29 DIAGNOSIS — F0390 Unspecified dementia without behavioral disturbance: Secondary | ICD-10-CM | POA: Diagnosis not present

## 2016-04-30 DIAGNOSIS — N186 End stage renal disease: Secondary | ICD-10-CM | POA: Diagnosis not present

## 2016-04-30 DIAGNOSIS — Z992 Dependence on renal dialysis: Secondary | ICD-10-CM | POA: Diagnosis not present

## 2016-04-30 DIAGNOSIS — F0390 Unspecified dementia without behavioral disturbance: Secondary | ICD-10-CM | POA: Diagnosis not present

## 2016-05-01 DIAGNOSIS — F0390 Unspecified dementia without behavioral disturbance: Secondary | ICD-10-CM | POA: Diagnosis not present

## 2016-05-02 DIAGNOSIS — F0390 Unspecified dementia without behavioral disturbance: Secondary | ICD-10-CM | POA: Diagnosis not present

## 2016-05-03 DIAGNOSIS — Z992 Dependence on renal dialysis: Secondary | ICD-10-CM | POA: Diagnosis not present

## 2016-05-03 DIAGNOSIS — N186 End stage renal disease: Secondary | ICD-10-CM | POA: Diagnosis not present

## 2016-05-03 DIAGNOSIS — F0390 Unspecified dementia without behavioral disturbance: Secondary | ICD-10-CM | POA: Diagnosis not present

## 2016-05-04 DIAGNOSIS — F0390 Unspecified dementia without behavioral disturbance: Secondary | ICD-10-CM | POA: Diagnosis not present

## 2016-05-05 DIAGNOSIS — E1051 Type 1 diabetes mellitus with diabetic peripheral angiopathy without gangrene: Secondary | ICD-10-CM | POA: Diagnosis not present

## 2016-05-05 DIAGNOSIS — L84 Corns and callosities: Secondary | ICD-10-CM | POA: Diagnosis not present

## 2016-05-05 DIAGNOSIS — F0390 Unspecified dementia without behavioral disturbance: Secondary | ICD-10-CM | POA: Diagnosis not present

## 2016-05-06 ENCOUNTER — Encounter (HOSPITAL_COMMUNITY): Payer: Self-pay

## 2016-05-06 ENCOUNTER — Other Ambulatory Visit (HOSPITAL_COMMUNITY): Payer: Self-pay | Admitting: Nephrology

## 2016-05-06 ENCOUNTER — Ambulatory Visit (HOSPITAL_COMMUNITY)
Admission: RE | Admit: 2016-05-06 | Discharge: 2016-05-06 | Disposition: A | Discharge status: Home / Self Care | Payer: Medicare HMO | Source: Ambulatory Visit | Attending: Nephrology | Admitting: Nephrology

## 2016-05-06 VITALS — BP 178/89 | HR 69 | Temp 97.8°F | Resp 18 | Ht 64.0 in | Wt 180.0 lb

## 2016-05-06 DIAGNOSIS — I69351 Hemiplegia and hemiparesis following cerebral infarction affecting right dominant side: Secondary | ICD-10-CM | POA: Insufficient documentation

## 2016-05-06 DIAGNOSIS — Z992 Dependence on renal dialysis: Secondary | ICD-10-CM | POA: Diagnosis not present

## 2016-05-06 DIAGNOSIS — N3281 Overactive bladder: Secondary | ICD-10-CM | POA: Diagnosis not present

## 2016-05-06 DIAGNOSIS — N186 End stage renal disease: Secondary | ICD-10-CM

## 2016-05-06 DIAGNOSIS — T82868A Thrombosis of vascular prosthetic devices, implants and grafts, initial encounter: Secondary | ICD-10-CM | POA: Insufficient documentation

## 2016-05-06 DIAGNOSIS — Z7982 Long term (current) use of aspirin: Secondary | ICD-10-CM | POA: Insufficient documentation

## 2016-05-06 DIAGNOSIS — E079 Disorder of thyroid, unspecified: Secondary | ICD-10-CM | POA: Insufficient documentation

## 2016-05-06 DIAGNOSIS — M109 Gout, unspecified: Secondary | ICD-10-CM | POA: Insufficient documentation

## 2016-05-06 DIAGNOSIS — F419 Anxiety disorder, unspecified: Secondary | ICD-10-CM | POA: Diagnosis not present

## 2016-05-06 DIAGNOSIS — I132 Hypertensive heart and chronic kidney disease with heart failure and with stage 5 chronic kidney disease, or end stage renal disease: Secondary | ICD-10-CM | POA: Insufficient documentation

## 2016-05-06 DIAGNOSIS — I509 Heart failure, unspecified: Secondary | ICD-10-CM | POA: Insufficient documentation

## 2016-05-06 DIAGNOSIS — I700 Atherosclerosis of aorta: Secondary | ICD-10-CM | POA: Diagnosis not present

## 2016-05-06 DIAGNOSIS — Y832 Surgical operation with anastomosis, bypass or graft as the cause of abnormal reaction of the patient, or of later complication, without mention of misadventure at the time of the procedure: Secondary | ICD-10-CM | POA: Diagnosis not present

## 2016-05-06 DIAGNOSIS — I471 Supraventricular tachycardia: Secondary | ICD-10-CM | POA: Insufficient documentation

## 2016-05-06 DIAGNOSIS — K219 Gastro-esophageal reflux disease without esophagitis: Secondary | ICD-10-CM | POA: Diagnosis not present

## 2016-05-06 DIAGNOSIS — F0390 Unspecified dementia without behavioral disturbance: Secondary | ICD-10-CM | POA: Diagnosis not present

## 2016-05-06 DIAGNOSIS — D631 Anemia in chronic kidney disease: Secondary | ICD-10-CM | POA: Insufficient documentation

## 2016-05-06 DIAGNOSIS — K589 Irritable bowel syndrome without diarrhea: Secondary | ICD-10-CM | POA: Insufficient documentation

## 2016-05-06 DIAGNOSIS — E1122 Type 2 diabetes mellitus with diabetic chronic kidney disease: Secondary | ICD-10-CM | POA: Insufficient documentation

## 2016-05-06 DIAGNOSIS — M1990 Unspecified osteoarthritis, unspecified site: Secondary | ICD-10-CM | POA: Insufficient documentation

## 2016-05-06 DIAGNOSIS — F329 Major depressive disorder, single episode, unspecified: Secondary | ICD-10-CM | POA: Diagnosis not present

## 2016-05-06 DIAGNOSIS — E785 Hyperlipidemia, unspecified: Secondary | ICD-10-CM | POA: Insufficient documentation

## 2016-05-06 HISTORY — PX: IR US GUIDE VASC ACCESS LEFT: IMG2389

## 2016-05-06 HISTORY — PX: IR THROMBECTOMY AV FISTULA W/THROMBOLYSIS/PTA INC/SHUNT/IMG LEFT: IMG6106

## 2016-05-06 LAB — BASIC METABOLIC PANEL
Anion gap: 12 (ref 5–15)
BUN: 78 mg/dL — AB (ref 6–20)
CALCIUM: 9 mg/dL (ref 8.9–10.3)
CO2: 30 mmol/L (ref 22–32)
CREATININE: 10.74 mg/dL — AB (ref 0.44–1.00)
Chloride: 97 mmol/L — ABNORMAL LOW (ref 101–111)
GFR calc Af Amer: 3 mL/min — ABNORMAL LOW (ref >60)
GFR, EST NON AFRICAN AMERICAN: 3 mL/min — AB (ref >60)
Glucose, Bld: 106 mg/dL — ABNORMAL HIGH (ref 65–99)
Potassium: 4.9 mmol/L (ref 3.5–5.1)
SODIUM: 139 mmol/L (ref 135–145)

## 2016-05-06 LAB — CBC
HCT: 28.6 % — ABNORMAL LOW (ref 36.0–46.0)
Hemoglobin: 9.3 g/dL — ABNORMAL LOW (ref 12.0–15.0)
MCH: 25.3 pg — AB (ref 26.0–34.0)
MCHC: 32.5 g/dL (ref 30.0–36.0)
MCV: 77.9 fL — ABNORMAL LOW (ref 78.0–100.0)
PLATELETS: 160 10*3/uL (ref 150–400)
RBC: 3.67 MIL/uL — ABNORMAL LOW (ref 3.87–5.11)
RDW: 15 % (ref 11.5–15.5)
WBC: 9.5 10*3/uL (ref 4.0–10.5)

## 2016-05-06 LAB — PROTIME-INR
INR: 1.08
PROTHROMBIN TIME: 14 s (ref 11.4–15.2)

## 2016-05-06 MED ORDER — IOPAMIDOL (ISOVUE-300) INJECTION 61%
INTRAVENOUS | Status: AC
  Administered 2016-05-06: 50 mL
  Filled 2016-05-06: qty 50

## 2016-05-06 MED ORDER — LIDOCAINE HCL 1 % IJ SOLN
INTRAMUSCULAR | Status: AC
  Filled 2016-05-06: qty 20

## 2016-05-06 MED ORDER — MIDAZOLAM HCL 2 MG/2ML IJ SOLN
INTRAMUSCULAR | Status: AC | PRN
  Administered 2016-05-06 (×2): 1 mg via INTRAVENOUS

## 2016-05-06 MED ORDER — HEPARIN SODIUM (PORCINE) 1000 UNIT/ML IJ SOLN
INTRAMUSCULAR | Status: AC | PRN
  Administered 2016-05-06: 3000 [IU] via INTRAVENOUS

## 2016-05-06 MED ORDER — ALTEPLASE 2 MG IJ SOLR
INTRAMUSCULAR | Status: AC
  Filled 2016-05-06: qty 2

## 2016-05-06 MED ORDER — FENTANYL CITRATE (PF) 100 MCG/2ML IJ SOLN
INTRAMUSCULAR | Status: AC
  Filled 2016-05-06: qty 2

## 2016-05-06 MED ORDER — MIDAZOLAM HCL 2 MG/2ML IJ SOLN
INTRAMUSCULAR | Status: AC
  Filled 2016-05-06: qty 2

## 2016-05-06 MED ORDER — LIDOCAINE HCL (PF) 1 % IJ SOLN
INTRAMUSCULAR | Status: AC | PRN
  Administered 2016-05-06: 20 mL

## 2016-05-06 MED ORDER — FENTANYL CITRATE (PF) 100 MCG/2ML IJ SOLN
INTRAMUSCULAR | Status: AC | PRN
  Administered 2016-05-06 (×2): 25 ug via INTRAVENOUS

## 2016-05-06 MED ORDER — ALTEPLASE 30 MG/30 ML FOR INTERV. RAD
INTRA_ARTERIAL | Status: AC | PRN
  Administered 2016-05-06: 2 mg via INTRA_ARTERIAL

## 2016-05-06 MED ORDER — HEPARIN SODIUM (PORCINE) 1000 UNIT/ML IJ SOLN
INTRAMUSCULAR | Status: AC
  Filled 2016-05-06: qty 1

## 2016-05-06 NOTE — Procedures (Signed)
Declot L arm HD fistula No complication No blood loss. See complete dictation in [facility].

## 2016-05-06 NOTE — H&P (Signed)
Chief Complaint: Patient was seen in consultation today for left upper arm dialysis graft thrombolysis at the request of [place]  Referring Physician(s): Fran Lowes  Supervising Physician: Arne Cleveland  Patient Status: [facility] - Out-pt  History of Present Illness: [name] is a 83 y.o. female   Last use Mon 05/03/16- without issue Wed access was clotted Now scheduled for declot procedure in IR  Last known imaging 05/15/15:  Patent tortuous fistula throughout  Moderate-large pseudoaneurysm in mid-segment of fistula corresponding to area of concern  Calcification in wall distal to pseudoaneurysm  Proximal cephalic vein with confluence with proximal subclavian vein with evidence of sclerotic vein without hemodynamically significant stenoses  Pt states this fistula is approx 83 yrs old Has had only one intervention that she can ren=member approx 4-6 months ago--- No documentation available that I see  Denies any recent surgical procedures Denies CVA Takes no blood thinning meds   Past Medical History:  Diagnosis Date  . Anemia   . Anemia in CKD (chronic kidney disease)   . Anxiety   . Arthritis    gout, right shoulder  . Cancer [facility])    ?stomach cancer   . Cataracts, both eyes   . CHF (congestive heart failure) ([place])   . CKD (chronic kidney disease), stage V ([place])    on HD on M/W/F  . Depression   . Diabetes mellitus, type 2 ([place])   . GERD (gastroesophageal reflux disease)   . Gynecologic cancer   . Hyperlipidemia   . Hypertension   . IBS (irritable bowel syndrome)    with costipation  . Intraoperative hemorrhage and hematoma of a digestive system organ or structure complicating a digestive system procedure   . Overactive bladder   . Pneumonia    2 times in 2016  . SOB (shortness of breath)    nml spirometry 09/05/06  . Stroke [facility])    residual right side weakness and pain  . SVT (supraventricular tachycardia) ([place])   . Syncope  and collapse   . Thyroid disease     Past Surgical History:  Procedure Laterality Date  . AV FISTULA PLACEMENT, BRACHIOCEPHALIC  40/10/2723   left arm   by Dr. Bridgett Larsson  . COLONOSCOPY    . FISTULA SUPERFICIALIZATION Left 06/10/2015   Procedure: LEFT UPPER ARM BRACHIOCEPHALIC FISTULA PSEUDOANEURYSM PLICATION;  Surgeon: Conrad Patmos, MD;  Location: [place];  Service: Vascular;  Laterality: Left;  . PERIPHERAL VASCULAR CATHETERIZATION Left 05/15/2015   Procedure: Fistulagram;  Surgeon: Conrad Nesconset, MD;  Location: [place] CV LAB;  Service: Cardiovascular;  Laterality: Left;  . TEE WITHOUT CARDIOVERSION  2011   2/2 Strep Bovis infection  . VESICOVAGINAL FISTULA CLOSURE W/ TAH      Allergies: Patient has no known allergies.  Medications: Prior to Admission medications   Medication Sig Start Date End Date Taking? Authorizing Provider  albuterol (PROVENTIL) (2.5 MG/3ML) 0.083% nebulizer solution USE 1 VIAL IN NEBULIZER 3 TIMES DAILY AS NEEDED FOR WHEEZING/SHORTNESS OF BREATH. 10/01/15  Yes Alycia Rossetti, MD  amLODipine (NORVASC) 10 MG tablet TAKE 1 TABLET BY MOUTH EVERY MORNING. 03/24/16  Yes Alycia Rossetti, MD  bisacodyl (DULCOLAX) 10 MG suppository 1 suppository rectally  Daily every 3 days as needed for constipation 10/17/15  Yes Alycia Rossetti, MD  citalopram (CELEXA) 20 MG tablet TAKE (1) TABLET BY MOUTH ONCE IN THE MORNING. 03/24/16  Yes Alycia Rossetti, MD  guaiFENesin-dextromethorphan (ROBITUSSIN DM) 100-10 MG/5ML syrup TAKE ONE  TEASPOONFUL BY MOUTH EVERY 4 HOURS AS NEEDED FOR COUGH 02/17/16  Yes Alycia Rossetti, MD  ipratropium (ATROVENT) 0.02 % nebulizer solution USE 1 VIAL IN NEBULIZER 3 TIMES DAILY AS NEEDED FOR WHEEZING/SHORTNESS OF BREATH. 10/01/15  Yes Alycia Rossetti, MD  isosorbide mononitrate (IMDUR) 60 MG 24 hr tablet TAKE (1) TABLET BY MOUTH ONCE DAILY. 03/24/16  Yes Alycia Rossetti, MD  levothyroxine (SYNTHROID, LEVOTHROID) 25 MCG tablet TAKE (1) TABLET BY MOUTH ONCE DAILY  BEFORE BREAKFAST. 03/24/16  Yes Alycia Rossetti, MD  lidocaine-prilocaine (EMLA) cream APPLY TOPICALLY TO LEFT UPPER ARM 1 HOUR PRIOR TO DIALYSIS ON MONDAY,WEDNESDAY & FRIDAYS AS NEEDED FOR PAIN. WRAP WITH PLASTIC WRAP AFTER A 03/23/16  Yes Alycia Rossetti, MD  MAPAP 325 MG tablet TAKE (2) TABLETS FOUR TIMES DAILY AS NEEDED FOR PAIN. -MAY MAKE DROWSY - 04/22/16  Yes Alycia Rossetti, MD  mirtazapine (REMERON) 15 MG tablet TAKE 1/2 TABLET (7.5MG ) BY MOUTH AT BEDTIME FOR MOOD/APPETITE. 03/24/16  Yes Alycia Rossetti, MD  Multiple Vitamin (DAILY-VITE) TABS TAKE 1 TABLET BY MOUTH ONCE DAILY. (ON DAY OF DIALYSIS, TAKE AFTER DIALYSIS) 03/24/16  Yes Alycia Rossetti, MD  OXYGEN Inhale 2 L into the lungs daily as needed (shortness of breath).   Yes Historical Provider, MD  polyethylene glycol powder (MIRALAX) powder Take 17 g by mouth daily. Hold for diarrhea 02/28/15  Yes Alycia Rossetti, MD  polyvinyl alcohol (LIQUIFILM TEARS) 1.4 % ophthalmic solution 1 drop as needed for dry eyes.   Yes Historical Provider, MD  QC LO-DOSE ASPIRIN 81 MG EC tablet TAKE (1) TABLET BY MOUTH ONCE DAILY. 03/24/16  Yes Alycia Rossetti, MD  ranitidine (ZANTAC) 150 MG tablet TAKE 1 TABLET BY MOUTH AT BEDTIME. 03/24/16  Yes Alycia Rossetti, MD  SENSIPAR 30 MG tablet TAKE 1 TABLET BY MOUTH ONCE DAILY AFTER THE EVENING MEAL 08/19/15  Yes Alycia Rossetti, MD  sevelamer (RENVELA) 800 MG tablet Take 800-2,400 mg by mouth 5 (five) times daily. 2400mg  three times daily with meals and 800mg  twice daily with snacks   Yes Historical Provider, MD  STOOL SOFTENER 100 MG capsule TAKE 1 CAPSULE BY MOUTH TWICE DAILY. 03/24/16  Yes Alycia Rossetti, MD  Travoprost, BAK Free, (TRAVATAN) 0.004 % SOLN ophthalmic solution Place 1 drop into both eyes at bedtime. 02/18/16  Yes Alycia Rossetti, MD  VENTOLIN HFA 108 [phone] Base) MCG/ACT inhaler INHALE (2) PUFFS EVERY 4 HOURS AS NEEDED FOR WHEEZING OR SHORTNESS OFBREATH(COUGH). 03/25/16  Yes Alycia Rossetti, MD    Lancets MISC Use as directed to monitor FSBS 1x daily x2 weeks, then 1x daily PRN S/Sx hypoglycemia. Dx: E11.9. 10/09/15   Alycia Rossetti, MD  nitroGLYCERIN (NITROSTAT) 0.4 MG SL tablet DISSOLVE 1 TABLET UNDER THE TONGUE EVERY 5 MINUTES X2 DOSES THEN TO ER 06/06/15   Alycia Rossetti, MD  predniSONE (DELTASONE) 10 MG tablet Take 2 tablets (20 mg total) by mouth daily. Patient not taking: Reported on 05/06/2016 04/22/16   Milton Ferguson, MD     History reviewed. No pertinent family history.  Social History   Social History  . Marital status: Widowed    Spouse name: N/A  . Number of children: N/A  . Years of education: N/A   Social History Main Topics  . Smoking status: Never Smoker  . Smokeless tobacco: Never Used  . Alcohol use No  . Drug use: No  . Sexual activity: No   Other Topics  Concern  . None   Social History Narrative  . None    Review of Systems: A 12 point ROS discussed and pertinent positives are indicated in the HPI above.  All other systems are negative.  Review of Systems  Constitutional: Negative for activity change, appetite change, fatigue and fever.  Respiratory: Negative for shortness of breath.   Gastrointestinal: Negative for abdominal pain.  Neurological: Negative for weakness.  Psychiatric/Behavioral: Negative for behavioral problems and confusion.    Vital Signs: BP (!) 157/95   Pulse 71   Temp 98 F (36.7 C) (Oral)   Resp 18   Ht 5\' 4"  (1.626 m)   Wt 180 lb (81.6 kg)   LMP 12/24/2010   SpO2 98%   BMI 30.90 kg/m   Physical Exam  Constitutional: She is oriented to person, place, and time. She appears well-nourished.  Cardiovascular: Normal rate and regular rhythm.   Pulmonary/Chest: Effort normal and breath sounds normal.  Abdominal: Soft. Bowel sounds are normal.  Musculoskeletal: Normal range of motion.  Left upper arm dialysis access: no pulse; no thrill  Neurological: She is alert and oriented to person, place, and time.  Skin:  Skin is warm and dry.  Psychiatric: She has a normal mood and affect. Her behavior is normal. Thought content normal.  Nursing note and vitals reviewed.   Mallampati Score:  MD Evaluation Airway: WNL Heart: WNL Abdomen: WNL Chest/ Lungs: WNL ASA  Classification: 3 Mallampati/Airway Score: One  Imaging: Dg Chest Portable 1 View  Result Date: 04/22/2016 CLINICAL DATA:  Shortness of breath since yesterday. EXAM: PORTABLE CHEST 1 VIEW COMPARISON:  12/11/2015 FINDINGS: Chronic cardiomegaly. Chronic aortic atherosclerosis. Poor inspiration. Possible pulmonary venous hypertension without frank edema. No visible effusion. IMPRESSION: Chronic cardiomegaly and aortic atherosclerosis. Possible pulmonary venous hypertension. Electronically Signed   By: Nelson Chimes M.D.   On: 04/22/2016 12:27    Labs:  CBC:  Recent Labs  10/02/15 1507 11/26/15 1500 04/22/16 1224 05/06/16 1435  WBC 9.3 9.0 5.9 9.5  HGB 10.5* 10.0* 9.5* 9.3*  HCT 32.7* 30.9* 29.0* 28.6*  PLT 197 199 151 160    COAGS: No results for input(s): INR, APTT in the last 8760 hours.  BMP:  Recent Labs  10/02/15 1507 10/03/15 0209 11/26/15 1500 04/22/16 1224  NA 134* 130* 132* 138  K 4.4 4.7 4.1 3.8  CL 93* 100* 94* 97*  CO2 29 27 29 31   GLUCOSE 223* 282* 151* 86  BUN 72* 86* 34* 45*  CALCIUM 9.2 8.2* 8.9 8.6*  CREATININE 7.50* 8.35* 5.43* 6.05*  GFRNONAA 4* 4* 6* 6*  GFRAA 5* 4* 7* 6*    LIVER FUNCTION TESTS:  Recent Labs  11/26/15 1500 04/22/16 1225  BILITOT 0.5 0.3  AST 17 19  ALT 12* 13*  ALKPHOS 89 102  PROT 7.0 6.6  ALBUMIN 3.5 3.7    TUMOR MARKERS: No results for input(s): AFPTM, CEA, CA199, CHROMGRNA in the last 8760 hours.  Assessment and Plan:  Left arm dialysis fistula clotted Now scheduled for thrombolysis with possible angioplasty/stent placement. Possible dialysis catheter placement if needed Risks and Benefits discussed with the patient including, but not limited to bleeding,  infection, vascular injury, pulmonary embolism, need for tunneled HD catheter placement or even death. All of the patient's questions were answered, patient is agreeable to proceed. Consent signed and in chart.   Thank you for this interesting consult.  I greatly enjoyed meeting AARTHI UYENO and look forward to participating in their  care.  A copy of this report was sent to the requesting provider on this date.  Electronically Signed: Monia Sabal A 05/06/2016, 2:58 PM   I spent a total of 30 min    in face to face in clinical consultation, greater than 50% of which was counseling/coordinating care for left dialysis access declot

## 2016-05-06 NOTE — Discharge Instructions (Signed)
Fistulogram, Care After °Refer to this sheet in the next few weeks. These instructions provide you with information on caring for yourself after your procedure. Your health care provider may also give you more specific instructions. Your treatment has been planned according to current medical practices, but problems sometimes occur. Call your health care provider if you have any problems or questions after your procedure. °What can I expect after the procedure? °After your procedure, it is typical to have the following: °· A small amount of discomfort in the area where the catheters were placed. °· A small amount of bruising around the fistula. °· Sleepiness and fatigue. °Follow these instructions at home: °· Rest at home for the day following your procedure. °· Do not drive or operate heavy machinery while taking pain medicine. °· Take medicines only as directed by your health care provider. °· Do not take baths, swim, or use a hot tub until your health care provider approves. You may shower 24 hours after the procedure or as directed by your health care provider. °· There are many different ways to close and cover an incision, including stitches, skin glue, and adhesive strips. Follow your health care provider's instructions on: °¨ Incision care. °¨ Bandage (dressing) changes and removal. °¨ Incision closure removal. °· Monitor your dialysis fistula carefully. °Contact a health care provider if: °· You have drainage, redness, swelling, or pain at your catheter site. °· You have a fever. °· You have chills. °Get help right away if: °· You feel weak. °· You have trouble balancing. °· You have trouble moving your arms or legs. °· You have problems with your speech or vision. °· You can no longer feel a vibration or buzz when you put your fingers over your dialysis fistula. °· The limb that was used for the procedure: °¨ Swells. °¨ Is painful. °¨ Is cold. °¨ Is discolored, such as blue or pale white. °This information  is not intended to replace advice given to you by your health care provider. Make sure you discuss any questions you have with your health care provider. °Document Released: 05/14/2013 Document Revised: 06/05/2015 Document Reviewed: 02/16/2013 °Elsevier Interactive Patient Education © 2017 [employer] ° °

## 2016-05-07 ENCOUNTER — Encounter (HOSPITAL_COMMUNITY): Payer: Self-pay | Admitting: Emergency Medicine

## 2016-05-07 ENCOUNTER — Observation Stay (HOSPITAL_COMMUNITY)
Admission: EM | Admit: 2016-05-07 | Discharge: 2016-05-10 | Disposition: A | Discharge status: Home / Self Care | Payer: Medicare HMO | Attending: Internal Medicine | Admitting: Internal Medicine

## 2016-05-07 ENCOUNTER — Emergency Department (HOSPITAL_COMMUNITY): Payer: Medicare HMO

## 2016-05-07 DIAGNOSIS — Z992 Dependence on renal dialysis: Secondary | ICD-10-CM | POA: Insufficient documentation

## 2016-05-07 DIAGNOSIS — I5032 Chronic diastolic (congestive) heart failure: Secondary | ICD-10-CM | POA: Diagnosis not present

## 2016-05-07 DIAGNOSIS — H269 Unspecified cataract: Secondary | ICD-10-CM | POA: Diagnosis present

## 2016-05-07 DIAGNOSIS — K219 Gastro-esophageal reflux disease without esophagitis: Secondary | ICD-10-CM | POA: Diagnosis not present

## 2016-05-07 DIAGNOSIS — I132 Hypertensive heart and chronic kidney disease with heart failure and with stage 5 chronic kidney disease, or end stage renal disease: Secondary | ICD-10-CM | POA: Diagnosis not present

## 2016-05-07 DIAGNOSIS — F329 Major depressive disorder, single episode, unspecified: Secondary | ICD-10-CM | POA: Insufficient documentation

## 2016-05-07 DIAGNOSIS — Z79899 Other long term (current) drug therapy: Secondary | ICD-10-CM | POA: Diagnosis not present

## 2016-05-07 DIAGNOSIS — D631 Anemia in chronic kidney disease: Secondary | ICD-10-CM | POA: Diagnosis not present

## 2016-05-07 DIAGNOSIS — R079 Chest pain, unspecified: Secondary | ICD-10-CM | POA: Diagnosis not present

## 2016-05-07 DIAGNOSIS — I471 Supraventricular tachycardia: Secondary | ICD-10-CM | POA: Insufficient documentation

## 2016-05-07 DIAGNOSIS — E1122 Type 2 diabetes mellitus with diabetic chronic kidney disease: Secondary | ICD-10-CM | POA: Diagnosis not present

## 2016-05-07 DIAGNOSIS — E039 Hypothyroidism, unspecified: Secondary | ICD-10-CM | POA: Diagnosis not present

## 2016-05-07 DIAGNOSIS — K224 Dyskinesia of esophagus: Secondary | ICD-10-CM | POA: Diagnosis not present

## 2016-05-07 DIAGNOSIS — J9610 Chronic respiratory failure, unspecified whether with hypoxia or hypercapnia: Secondary | ICD-10-CM | POA: Diagnosis not present

## 2016-05-07 DIAGNOSIS — E1142 Type 2 diabetes mellitus with diabetic polyneuropathy: Secondary | ICD-10-CM | POA: Diagnosis not present

## 2016-05-07 DIAGNOSIS — I120 Hypertensive chronic kidney disease with stage 5 chronic kidney disease or end stage renal disease: Secondary | ICD-10-CM | POA: Diagnosis not present

## 2016-05-07 DIAGNOSIS — R778 Other specified abnormalities of plasma proteins: Secondary | ICD-10-CM

## 2016-05-07 DIAGNOSIS — I1311 Hypertensive heart and chronic kidney disease without heart failure, with stage 5 chronic kidney disease, or end stage renal disease: Secondary | ICD-10-CM | POA: Diagnosis present

## 2016-05-07 DIAGNOSIS — K5900 Constipation, unspecified: Secondary | ICD-10-CM | POA: Insufficient documentation

## 2016-05-07 DIAGNOSIS — I10 Essential (primary) hypertension: Secondary | ICD-10-CM

## 2016-05-07 DIAGNOSIS — E1136 Type 2 diabetes mellitus with diabetic cataract: Secondary | ICD-10-CM | POA: Diagnosis not present

## 2016-05-07 DIAGNOSIS — N186 End stage renal disease: Secondary | ICD-10-CM | POA: Diagnosis not present

## 2016-05-07 DIAGNOSIS — Z7982 Long term (current) use of aspirin: Secondary | ICD-10-CM | POA: Insufficient documentation

## 2016-05-07 DIAGNOSIS — R7989 Other specified abnormal findings of blood chemistry: Secondary | ICD-10-CM

## 2016-05-07 DIAGNOSIS — I69351 Hemiplegia and hemiparesis following cerebral infarction affecting right dominant side: Secondary | ICD-10-CM | POA: Diagnosis not present

## 2016-05-07 DIAGNOSIS — E1129 Type 2 diabetes mellitus with other diabetic kidney complication: Secondary | ICD-10-CM | POA: Diagnosis present

## 2016-05-07 DIAGNOSIS — R0789 Other chest pain: Principal | ICD-10-CM | POA: Insufficient documentation

## 2016-05-07 DIAGNOSIS — N185 Chronic kidney disease, stage 5: Secondary | ICD-10-CM

## 2016-05-07 DIAGNOSIS — F0390 Unspecified dementia without behavioral disturbance: Secondary | ICD-10-CM | POA: Diagnosis not present

## 2016-05-07 DIAGNOSIS — R1310 Dysphagia, unspecified: Secondary | ICD-10-CM

## 2016-05-07 DIAGNOSIS — N189 Chronic kidney disease, unspecified: Secondary | ICD-10-CM

## 2016-05-07 HISTORY — DX: Reserved for inherently not codable concepts without codable children: IMO0001

## 2016-05-07 LAB — CBC WITH DIFFERENTIAL/PLATELET
BASOS ABS: 0 10*3/uL (ref 0.0–0.1)
BASOS PCT: 0 %
EOS ABS: 0 10*3/uL (ref 0.0–0.7)
EOS PCT: 0 %
HEMATOCRIT: 29.1 % — AB (ref 36.0–46.0)
Hemoglobin: 9.8 g/dL — ABNORMAL LOW (ref 12.0–15.0)
Lymphocytes Relative: 9 %
Lymphs Abs: 1.1 10*3/uL (ref 0.7–4.0)
MCH: 26.6 pg (ref 26.0–34.0)
MCHC: 33.7 g/dL (ref 30.0–36.0)
MCV: 78.9 fL (ref 78.0–100.0)
MONO ABS: 1 10*3/uL (ref 0.1–1.0)
MONOS PCT: 8 %
Neutro Abs: 9.9 10*3/uL — ABNORMAL HIGH (ref 1.7–7.7)
Neutrophils Relative %: 83 %
PLATELETS: 132 10*3/uL — AB (ref 150–400)
RBC: 3.69 MIL/uL — ABNORMAL LOW (ref 3.87–5.11)
RDW: 15.2 % (ref 11.5–15.5)
WBC: 12 10*3/uL — ABNORMAL HIGH (ref 4.0–10.5)

## 2016-05-07 LAB — TROPONIN I
TROPONIN I: 0.67 ng/mL — AB (ref <0.03)
TROPONIN I: 0.46 ng/mL — AB (ref <0.03)
TROPONIN I: 0.24 ng/mL — AB (ref <0.03)
Troponin I: 0.67 ng/mL (ref <0.03)

## 2016-05-07 LAB — BASIC METABOLIC PANEL
ANION GAP: 12 (ref 5–15)
BUN: 91 mg/dL — ABNORMAL HIGH (ref 6–20)
CALCIUM: 9 mg/dL (ref 8.9–10.3)
CO2: 28 mmol/L (ref 22–32)
CREATININE: 11.59 mg/dL — AB (ref 0.44–1.00)
Chloride: 99 mmol/L — ABNORMAL LOW (ref 101–111)
GFR calc Af Amer: 3 mL/min — ABNORMAL LOW (ref >60)
GFR, EST NON AFRICAN AMERICAN: 3 mL/min — AB (ref >60)
GLUCOSE: 108 mg/dL — AB (ref 65–99)
Potassium: 5.7 mmol/L — ABNORMAL HIGH (ref 3.5–5.1)
SODIUM: 139 mmol/L (ref 135–145)

## 2016-05-07 LAB — CREATININE, SERUM
Creatinine, Ser: 12.18 mg/dL — ABNORMAL HIGH (ref 0.44–1.00)
GFR calc Af Amer: 3 mL/min — ABNORMAL LOW (ref >60)
GFR calc non Af Amer: 2 mL/min — ABNORMAL LOW (ref >60)

## 2016-05-07 LAB — MRSA PCR SCREENING: MRSA by PCR: NEGATIVE

## 2016-05-07 MED ORDER — POLYVINYL ALCOHOL 1.4 % OP SOLN: 1.0000 [drp] | OPHTHALMIC | Status: DC | PRN

## 2016-05-07 MED ORDER — ACETAMINOPHEN 650 MG RE SUPP: 650.0000 mg | Freq: Four times a day (QID) | RECTAL | Status: DC | PRN

## 2016-05-07 MED ORDER — DOXERCALCIFEROL 4 MCG/2ML IV SOLN
4.0000 ug | INTRAVENOUS | Status: DC
  Administered 2016-05-10: 4 ug via INTRAVENOUS
  Filled 2016-05-07: qty 2

## 2016-05-07 MED ORDER — ASPIRIN EC 81 MG PO TBEC
81.0000 mg | DELAYED_RELEASE_TABLET | Freq: Every day | ORAL | Status: DC
  Administered 2016-05-08 – 2016-05-10 (×3): 81 mg via ORAL
  Filled 2016-05-07 (×3): qty 1

## 2016-05-07 MED ORDER — MORPHINE SULFATE (PF) 4 MG/ML IV SOLN
0.5000 mg | INTRAVENOUS | Status: DC | PRN
Start: 2016-05-07 — End: 2016-05-10

## 2016-05-07 MED ORDER — MORPHINE SULFATE (PF) 2 MG/ML IV SOLN: 0.5000 mg | INTRAVENOUS | Status: DC | PRN

## 2016-05-07 MED ORDER — HEPARIN SODIUM (PORCINE) 5000 UNIT/ML IJ SOLN
5000.0000 [IU] | Freq: Three times a day (TID) | INTRAMUSCULAR | Status: DC
  Administered 2016-05-07 – 2016-05-10 (×8): 5000 [IU] via SUBCUTANEOUS
  Filled 2016-05-07 (×8): qty 1

## 2016-05-07 MED ORDER — SEVELAMER CARBONATE 800 MG PO TABS
2400.0000 mg | ORAL_TABLET | Freq: Three times a day (TID) | ORAL | Status: DC
  Administered 2016-05-07 – 2016-05-10 (×8): 2400 mg via ORAL
  Filled 2016-05-07 (×8): qty 3

## 2016-05-07 MED ORDER — SEVELAMER CARBONATE 800 MG PO TABS: 800.0000 mg | ORAL_TABLET | Freq: Two times a day (BID) | ORAL | Status: DC | PRN

## 2016-05-07 MED ORDER — DOCUSATE SODIUM 100 MG PO CAPS
100.0000 mg | ORAL_CAPSULE | Freq: Two times a day (BID) | ORAL | Status: DC
  Administered 2016-05-07 – 2016-05-10 (×6): 100 mg via ORAL
  Filled 2016-05-07 (×6): qty 1

## 2016-05-07 MED ORDER — ORAL CARE MOUTH RINSE
15.0000 mL | Freq: Two times a day (BID) | OROMUCOSAL | Status: DC
  Administered 2016-05-08 – 2016-05-09 (×2): 15 mL via OROMUCOSAL

## 2016-05-07 MED ORDER — LEVOTHYROXINE SODIUM 25 MCG PO TABS
25.0000 ug | ORAL_TABLET | Freq: Every day | ORAL | Status: DC
  Administered 2016-05-08 – 2016-05-10 (×3): 25 ug via ORAL
  Filled 2016-05-07 (×3): qty 1

## 2016-05-07 MED ORDER — SODIUM CHLORIDE 0.9% FLUSH: 3.0000 mL | INTRAVENOUS | Status: DC | PRN

## 2016-05-07 MED ORDER — AMLODIPINE BESYLATE 10 MG PO TABS
10.0000 mg | ORAL_TABLET | Freq: Every morning | ORAL | Status: DC
  Administered 2016-05-08 – 2016-05-10 (×3): 10 mg via ORAL
  Filled 2016-05-07 (×3): qty 1

## 2016-05-07 MED ORDER — MIRTAZAPINE 15 MG PO TABS
7.5000 mg | ORAL_TABLET | Freq: Every day | ORAL | Status: DC
  Administered 2016-05-07 – 2016-05-09 (×3): 7.5 mg via ORAL
  Filled 2016-05-07 (×3): qty 1

## 2016-05-07 MED ORDER — ISOSORBIDE MONONITRATE ER 60 MG PO TB24
60.0000 mg | ORAL_TABLET | Freq: Every day | ORAL | Status: DC
  Administered 2016-05-08 – 2016-05-10 (×3): 60 mg via ORAL
  Filled 2016-05-07 (×4): qty 1

## 2016-05-07 MED ORDER — SODIUM CHLORIDE 0.9% FLUSH
3.0000 mL | Freq: Two times a day (BID) | INTRAVENOUS | Status: DC
  Administered 2016-05-07 – 2016-05-10 (×7): 3 mL via INTRAVENOUS

## 2016-05-07 MED ORDER — CINACALCET HCL 30 MG PO TABS
30.0000 mg | ORAL_TABLET | Freq: Every day | ORAL | Status: DC
  Administered 2016-05-08 – 2016-05-10 (×3): 30 mg via ORAL
  Filled 2016-05-07 (×3): qty 1

## 2016-05-07 MED ORDER — ONDANSETRON HCL 4 MG/2ML IJ SOLN: 4.0000 mg | Freq: Four times a day (QID) | INTRAMUSCULAR | Status: DC | PRN

## 2016-05-07 MED ORDER — FAMOTIDINE 20 MG PO TABS
20.0000 mg | ORAL_TABLET | Freq: Every day | ORAL | Status: DC
  Administered 2016-05-07: 20 mg via ORAL
  Filled 2016-05-07 (×2): qty 1

## 2016-05-07 MED ORDER — CITALOPRAM HYDROBROMIDE 20 MG PO TABS
20.0000 mg | ORAL_TABLET | Freq: Every day | ORAL | Status: DC
  Administered 2016-05-07 – 2016-05-10 (×4): 20 mg via ORAL
  Filled 2016-05-07 (×4): qty 1

## 2016-05-07 MED ORDER — ONDANSETRON HCL 4 MG PO TABS: 4.0000 mg | ORAL_TABLET | Freq: Four times a day (QID) | ORAL | Status: DC | PRN

## 2016-05-07 MED ORDER — DARBEPOETIN ALFA 60 MCG/0.3ML IJ SOSY
PREFILLED_SYRINGE | INTRAMUSCULAR | Status: AC
  Filled 2016-05-07: qty 0.3

## 2016-05-07 MED ORDER — ACETAMINOPHEN 325 MG PO TABS: 650.0000 mg | ORAL_TABLET | Freq: Four times a day (QID) | ORAL | Status: DC | PRN

## 2016-05-07 MED ORDER — POLYETHYLENE GLYCOL 3350 17 G PO PACK
17.0000 g | PACK | Freq: Every day | ORAL | Status: DC
  Administered 2016-05-09: 17 g via ORAL
  Filled 2016-05-07 (×3): qty 1

## 2016-05-07 MED ORDER — MORPHINE SULFATE (PF) 2 MG/ML IV SOLN: 2.0000 mg | INTRAVENOUS | Status: DC | PRN

## 2016-05-07 MED ORDER — IPRATROPIUM BROMIDE 0.02 % IN SOLN: 0.5000 mg | Freq: Four times a day (QID) | RESPIRATORY_TRACT | Status: DC | PRN

## 2016-05-07 MED ORDER — SODIUM CHLORIDE 0.9 % IV SOLN: 250.0000 mL | INTRAVENOUS | Status: DC | PRN

## 2016-05-07 MED ORDER — POLYETHYLENE GLYCOL 3350 17 GM/SCOOP PO POWD: 17.0000 g | Freq: Every day | ORAL | Status: DC

## 2016-05-07 MED ORDER — ALBUTEROL SULFATE (2.5 MG/3ML) 0.083% IN NEBU
3.0000 mL | INHALATION_SOLUTION | Freq: Four times a day (QID) | RESPIRATORY_TRACT | Status: DC
  Administered 2016-05-07: 3 mL via RESPIRATORY_TRACT
  Filled 2016-05-07 (×2): qty 3

## 2016-05-07 MED ORDER — NITROGLYCERIN 0.4 MG SL SUBL: 0.4000 mg | SUBLINGUAL_TABLET | SUBLINGUAL | Status: DC | PRN

## 2016-05-07 MED ORDER — SEVELAMER CARBONATE 800 MG PO TABS: 800.0000 mg | ORAL_TABLET | Freq: Two times a day (BID) | ORAL | Status: DC

## 2016-05-07 MED ORDER — ASPIRIN 81 MG PO TBEC: 81.0000 mg | DELAYED_RELEASE_TABLET | Freq: Every day | ORAL | Status: DC

## 2016-05-07 MED ORDER — LATANOPROST 0.005 % OP SOLN
1.0000 [drp] | Freq: Every day | OPHTHALMIC | Status: DC
  Administered 2016-05-07 – 2016-05-09 (×3): 1 [drp] via OPHTHALMIC
  Filled 2016-05-07: qty 2.5

## 2016-05-07 MED ORDER — DARBEPOETIN ALFA 60 MCG/0.3ML IJ SOSY
60.0000 ug | PREFILLED_SYRINGE | INTRAMUSCULAR | Status: DC
  Administered 2016-05-07: 60 ug via INTRAVENOUS
  Filled 2016-05-07: qty 0.3

## 2016-05-07 MED ORDER — SODIUM POLYSTYRENE SULFONATE 15 GM/60ML PO SUSP: 15.0000 g | Freq: Once | ORAL | Status: DC

## 2016-05-07 MED ORDER — ALUM & MAG HYDROXIDE-SIMETH 200-200-20 MG/5ML PO SUSP: 30.0000 mL | ORAL | Status: DC | PRN

## 2016-05-07 NOTE — ED Provider Notes (Signed)
[facility] DEPT Provider Note   CSN: [id number] Arrival date & time: 05/07/16  4332     History   Chief Complaint Chief Complaint  Patient presents with  . Chest Pain    HPI [name] is a 83 y.o. female.   Chest Pain      Pt was seen at 0705. Per EMS, NH, and pt report, c/o gradual onset and improvement of constant left sided chest "pain" that woke her up from sleep approximately 0400/0500 PTA. Has been associated with nausea and one episode of diarrhea. Pt states she is "always SOB." EMS gave ASA en route. Denies abd pain, no vomiting, no back pain, no fevers, no palpitations, no cough.   Past Medical History:  Diagnosis Date  . Anemia   . Anemia in CKD (chronic kidney disease)   . Anxiety   . Arthritis    gout, right shoulder  . Cancer [facility])    ?stomach cancer   . Cataracts, both eyes   . CHF (congestive heart failure) ([place])   . CKD (chronic kidney disease), stage V ([place])    on HD on M/W/F  . Depression   . Diabetes mellitus, type 2 ([place])   . GERD (gastroesophageal reflux disease)   . Gynecologic cancer   . Hyperlipidemia   . Hypertension   . IBS (irritable bowel syndrome)    with costipation  . Intraoperative hemorrhage and hematoma of a digestive system organ or structure complicating a digestive system procedure   . Normal cardiac stress test 05/2015  . Overactive bladder   . Pneumonia    2 times in 2016  . SOB (shortness of breath)    nml spirometry 09/05/06  . Stroke [facility])    residual right side weakness and pain  . SVT (supraventricular tachycardia) ([place])   . Syncope and collapse   . Thyroid disease     Patient Active Problem List   Diagnosis Date Noted  . Chronic diastolic heart failure ([place]) 10/07/2015  . Fluid overload 10/02/2015  . Hyperglycemia 10/02/2015  . D-dimer, elevated   . Depression   . Pseudoaneurysm of arteriovenous dialysis fistula ([place]) 05/29/2015  . Traumatic hematoma of scalp 02/12/2014  . Decreased hearing of  both ears 02/12/2014  . At high risk for falls 02/12/2014  . Leg swelling 09/18/2013  . OA (osteoarthritis) of knee 09/18/2013  . MDD (major depressive disorder) ([place]) 04/05/2013  . Mechanical complication of other vascular device, implant, and graft 11/14/2012  . Pain in limb-Left arm 07/11/2012  . Hypothyroidism 06/08/2012  . Heartburn 03/23/2012  . Gait instability 01/13/2012  . Neuropathy of hand 01/13/2012  . Breast mass, right 07/11/2011  . Hemorrhoid 03/14/2011  . ESRD (end stage renal disease) on dialysis ([place]) 02/12/2011  . Bradycardia 02/01/2011  . Dyspnea 01/17/2011  . Chronic respiratory failure ([place]) 01/13/2011  . Constipation 12/09/2010  . Insomnia 10/14/2010  . Diabetes mellitus with renal complications ([place]) 95/18/8416  . ALLERGIC RHINITIS 04/03/2008  . Benign hypertensive heart and kidney disease and CKD stage V ([place]) 08/07/2007  . DEPENDENT EDEMA, LEGS, BILATERAL 11/17/2006  . HYPERLIPIDEMIA 10/10/2006  . ANEMIA OF RENAL FAILURE 10/10/2006  . ANXIETY 09/05/2006  . CATARACT NOS 09/05/2006  . IBS 09/05/2006  . ARTHRITIS 09/05/2006    Past Surgical History:  Procedure Laterality Date  . AV FISTULA PLACEMENT, BRACHIOCEPHALIC  60/63/0160   left arm   by Dr. Bridgett Larsson  . COLONOSCOPY    . FISTULA SUPERFICIALIZATION Left 06/10/2015   Procedure: LEFT  UPPER ARM BRACHIOCEPHALIC FISTULA PSEUDOANEURYSM PLICATION;  Surgeon: Conrad Leola, MD;  Location: [place];  Service: Vascular;  Laterality: Left;  . IR THROMBECTOMY AV FISTULA W/THROMBOLYSIS/PTA INC/SHUNT/IMG LEFT Left 05/06/2016  . PERIPHERAL VASCULAR CATHETERIZATION Left 05/15/2015   Procedure: Fistulagram;  Surgeon: Conrad Kennewick, MD;  Location: [facility];  Service: Cardiovascular;  Laterality: Left;  . TEE WITHOUT CARDIOVERSION  2011   2/2 Strep Bovis infection  . VESICOVAGINAL FISTULA CLOSURE W/ TAH      OB History    No data available       Home Medications    Prior to Admission medications   Medication  Sig Start Date End Date Taking? Authorizing Provider  albuterol (PROVENTIL) (2.5 MG/3ML) 0.083% nebulizer solution USE 1 VIAL IN NEBULIZER 3 TIMES DAILY AS NEEDED FOR WHEEZING/SHORTNESS OF BREATH. 10/01/15   Alycia Rossetti, MD  amLODipine (NORVASC) 10 MG tablet TAKE 1 TABLET BY MOUTH EVERY MORNING. 03/24/16   Alycia Rossetti, MD  bisacodyl (DULCOLAX) 10 MG suppository 1 suppository rectally  Daily every 3 days as needed for constipation 10/17/15   Alycia Rossetti, MD  citalopram (CELEXA) 20 MG tablet TAKE (1) TABLET BY MOUTH ONCE IN THE MORNING. 03/24/16   Alycia Rossetti, MD  guaiFENesin-dextromethorphan (ROBITUSSIN DM) 100-10 MG/5ML syrup TAKE ONE TEASPOONFUL BY MOUTH EVERY 4 HOURS AS NEEDED FOR COUGH 02/17/16   Alycia Rossetti, MD  ipratropium (ATROVENT) 0.02 % nebulizer solution USE 1 VIAL IN NEBULIZER 3 TIMES DAILY AS NEEDED FOR WHEEZING/SHORTNESS OF BREATH. 10/01/15   Alycia Rossetti, MD  isosorbide mononitrate (IMDUR) 60 MG 24 hr tablet TAKE (1) TABLET BY MOUTH ONCE DAILY. 03/24/16   Alycia Rossetti, MD  Lancets MISC Use as directed to monitor FSBS 1x daily x2 weeks, then 1x daily PRN S/Sx hypoglycemia. Dx: E11.9. 10/09/15   Alycia Rossetti, MD  levothyroxine (SYNTHROID, LEVOTHROID) 25 MCG tablet TAKE (1) TABLET BY MOUTH ONCE DAILY BEFORE BREAKFAST. 03/24/16   Alycia Rossetti, MD  lidocaine-prilocaine (EMLA) cream APPLY TOPICALLY TO LEFT UPPER ARM 1 HOUR PRIOR TO DIALYSIS ON MONDAY,WEDNESDAY & FRIDAYS AS NEEDED FOR PAIN. WRAP WITH PLASTIC WRAP AFTER A 03/23/16   Alycia Rossetti, MD  MAPAP 325 MG tablet TAKE (2) TABLETS FOUR TIMES DAILY AS NEEDED FOR PAIN. -MAY MAKE DROWSY - 04/22/16   Alycia Rossetti, MD  mirtazapine (REMERON) 15 MG tablet TAKE 1/2 TABLET (7.5MG ) BY MOUTH AT BEDTIME FOR MOOD/APPETITE. 03/24/16   Alycia Rossetti, MD  Multiple Vitamin (DAILY-VITE) TABS TAKE 1 TABLET BY MOUTH ONCE DAILY. (ON DAY OF DIALYSIS, TAKE AFTER DIALYSIS) 03/24/16   Alycia Rossetti, MD  nitroGLYCERIN  (NITROSTAT) 0.4 MG SL tablet DISSOLVE 1 TABLET UNDER THE TONGUE EVERY 5 MINUTES X2 DOSES THEN TO ER 06/06/15   Alycia Rossetti, MD  OXYGEN Inhale 2 L into the lungs daily as needed (shortness of breath).    Historical Provider, MD  polyethylene glycol powder (MIRALAX) powder Take 17 g by mouth daily. Hold for diarrhea 02/28/15   Alycia Rossetti, MD  polyvinyl alcohol (LIQUIFILM TEARS) 1.4 % ophthalmic solution 1 drop as needed for dry eyes.    Historical Provider, MD  predniSONE (DELTASONE) 10 MG tablet Take 2 tablets (20 mg total) by mouth daily. Patient not taking: Reported on 05/06/2016 04/22/16   Milton Ferguson, MD  QC LO-DOSE ASPIRIN 81 MG EC tablet TAKE (1) TABLET BY MOUTH ONCE DAILY. 03/24/16   Alycia Rossetti, MD  ranitidine (ZANTAC) 150 MG tablet TAKE 1 TABLET BY MOUTH AT BEDTIME. 03/24/16   Alycia Rossetti, MD  SENSIPAR 30 MG tablet TAKE 1 TABLET BY MOUTH ONCE DAILY AFTER THE EVENING MEAL 08/19/15   Alycia Rossetti, MD  sevelamer (RENVELA) 800 MG tablet Take 800-2,400 mg by mouth 5 (five) times daily. 2400mg  three times daily with meals and 800mg  twice daily with snacks    Historical Provider, MD  STOOL SOFTENER 100 MG capsule TAKE 1 CAPSULE BY MOUTH TWICE DAILY. 03/24/16   Alycia Rossetti, MD  Travoprost, BAK Free, (TRAVATAN) 0.004 % SOLN ophthalmic solution Place 1 drop into both eyes at bedtime. 02/18/16   Alycia Rossetti, MD  VENTOLIN HFA 108 (90 Base) MCG/ACT inhaler INHALE (2) PUFFS EVERY 4 HOURS AS NEEDED FOR WHEEZING OR SHORTNESS OFBREATH(COUGH). 03/25/16   Alycia Rossetti, MD    Family History No family history on file.  Social History Social History  Substance Use Topics  . Smoking status: Never Smoker  . Smokeless tobacco: Never Used  . Alcohol use No     Allergies   Patient has no known allergies.   Review of Systems Review of Systems  Cardiovascular: Positive for chest pain.   ROS: Statement: All systems negative except as marked or noted in the HPI;  Constitutional: Negative for fever and chills. ; ; Eyes: Negative for eye pain, redness and discharge. ; ; ENMT: Negative for ear pain, hoarseness, nasal congestion, sinus pressure and sore throat. ; ; Cardiovascular: +CP. Negative for palpitations, diaphoresis, dyspnea and peripheral edema. ; ; Respiratory: Negative for cough, wheezing and stridor. ; ; Gastrointestinal: +nausea, diarrhea. Negative for vomiting, abdominal pain, blood in stool, hematemesis, jaundice and rectal bleeding. . ; ; Genitourinary: Negative for dysuria, flank pain and hematuria. ; ; Musculoskeletal: Negative for back pain and neck pain. Negative for swelling and trauma.; ; Skin: Negative for pruritus, rash, abrasions, blisters, bruising and skin lesion.; ; Neuro: Negative for headache, lightheadedness and neck stiffness. Negative for weakness, altered level of consciousness, altered mental status, extremity weakness, paresthesias, involuntary movement, seizure and syncope.       Physical Exam Updated Vital Signs BP (!) 180/82 (BP Location: Right Arm)   Pulse (!) 116   Temp 98.5 F (36.9 C) (Oral)   Resp 20   LMP 12/24/2010   SpO2 100%   Physical Exam 0710: Physical examination:  Nursing notes reviewed; Vital signs and O2 SAT reviewed;  Constitutional: Well developed, Well nourished, Well hydrated, In no acute distress; Head:  Normocephalic, atraumatic; Eyes: EOMI, PERRL, No scleral icterus; ENMT: Mouth and pharynx normal, Mucous membranes moist; Neck: Supple, Full range of motion, No lymphadenopathy; Cardiovascular: Regular rate and rhythm, No gallop; Respiratory: Breath sounds clear & equal bilaterally, No wheezes.  Speaking full sentences with ease, Normal respiratory effort/excursion; Chest: Nontender, Movement normal; Abdomen: Soft, Nontender, Nondistended, Normal bowel sounds; Genitourinary: No CVA tenderness; Extremities: Pulses normal, No tenderness, No edema, No calf edema or asymmetry.; Neuro: AA&Ox3, Major CN  grossly intact.  Speech clear. No gross focal motor or sensory deficits in extremities.; Skin: Color normal, Warm, Dry.   ED Treatments / Results  Labs (all labs ordered are listed, but only abnormal results are displayed)   EKG  EKG Interpretation None       Radiology   Procedures Procedures (including critical care time)  Medications Ordered in ED Medications  nitroGLYCERIN (NITROSTAT) SL tablet 0.4 mg (not administered)  morphine 2 MG/ML injection 2 mg (not administered)  Initial Impression / Assessment and Plan / ED Course  I have reviewed the triage vital signs and the nursing notes.  Pertinent labs & imaging results that were available during my care of the patient were reviewed by me and considered in my medical decision making (see chart for details).  MDM Reviewed: previous chart, nursing note and vitals Reviewed previous: labs and ECG Interpretation: labs, ECG and x-ray   ED ECG REPORT   Date: 05/07/2016  Rate: 101  Rhythm: sinus tachycardia  QRS Axis: normal  Intervals: QT prolonged  ST/T Wave abnormalities: nonspecific ST/T changes  Conduction Disutrbances:none  Narrative Interpretation: LVH, PRWP  Old EKG Reviewed: changes noted; STTW changes similar compared to EKG 11/26/2015.  Marland Kitchen Results for orders placed or performed during the hospital encounter of 05/39/76  Basic metabolic panel  Result Value Ref Range   Sodium 139 135 - 145 mmol/L   Potassium 5.7 (H) 3.5 - 5.1 mmol/L   Chloride 99 (L) 101 - 111 mmol/L   CO2 28 22 - 32 mmol/L   Glucose, Bld 108 (H) 65 - 99 mg/dL   BUN 91 (H) 6 - 20 mg/dL   Creatinine, Ser 11.59 (H) 0.44 - 1.00 mg/dL   Calcium 9.0 8.9 - 10.3 mg/dL   GFR calc non Af Amer 3 (L) >60 mL/min   GFR calc Af Amer 3 (L) >60 mL/min   Anion gap 12 5 - 15  CBC with Differential/Platelet  Result Value Ref Range   WBC 12.0 (H) 4.0 - 10.5 K/uL   RBC 3.69 (L) 3.87 - 5.11 MIL/uL   Hemoglobin 9.8 (L) 12.0 - 15.0 g/dL   HCT 29.1  (L) 36.0 - 46.0 %   MCV 78.9 78.0 - 100.0 fL   MCH 26.6 26.0 - 34.0 pg   MCHC 33.7 30.0 - 36.0 g/dL   RDW 15.2 11.5 - 15.5 %   Platelets 132 (L) 150 - 400 K/uL   Neutrophils Relative % 83 %   Neutro Abs 9.9 (H) 1.7 - 7.7 K/uL   Lymphocytes Relative 9 %   Lymphs Abs 1.1 0.7 - 4.0 K/uL   Monocytes Relative 8 %   Monocytes Absolute 1.0 0.1 - 1.0 K/uL   Eosinophils Relative 0 %   Eosinophils Absolute 0.0 0.0 - 0.7 K/uL   Basophils Relative 0 %   Basophils Absolute 0.0 0.0 - 0.1 K/uL  Troponin I  Result Value Ref Range   Troponin I 0.67 (HH) <0.03 ng/mL   Dg Chest Port 1 View Result Date: 05/07/2016 CLINICAL DATA:  83 year old female with left-sided chest pain. EXAM: PORTABLE CHEST 1 VIEW COMPARISON:  Chest radiograph dated 04/22/2016 and 12/11/2015 FINDINGS: There is eventration of the left hemidiaphragm similar to the prior radiographs. A small left pleural effusion or left lung base infiltrate is not entirely excluded. Clinical correlation is recommended. The right lung is clear. There is no pneumothorax. There is stable cardiac silhouette. Atherosclerotic calcification of the aortic arch. Mild central vascular prominence as seen on the prior radiograph may represent mild vascular congestion. There is degenerative changes of the shoulders. No acute osseous pathology. IMPRESSION: 1. Persistent elevation of the left hemidiaphragm with associated subsegmental atelectasis of the left lung base. A small left sided pleural effusion or left lung base infiltrate is not entirely excluded. Clinical correlation is recommended. 2. Mild central vascular prominence similar to prior studies may represent mild vascular congestion. Electronically Signed   By: Anner Crete M.D.   On: 05/07/2016 06:59  Results for SOLA, MARGOLIS (MRN [id number]) as of 05/07/2016 08:10  Ref. Range 10/02/2015 15:07 10/03/2015 02:09 10/03/2015 08:14 04/22/2016 12:24 05/07/2016 06:56  Troponin I Latest Ref Range: <0.03 ng/mL 0.04  (HH) 0.03 (HH) 0.04 (HH) 0.04 (HH) 0.67 (HH)    0825:  ASA given by EMS. SL ntg and IV morphine given in ED. Troponin chronically elevated, but today's value higher than baseline. T/C to [facility] Dr. Tyrell Antonio, case discussed, including:  HPI, pertinent PM/SHx, VS/PE, dx testing, ED course and treatment:  Agreeable to come to ED for evaluation.     Final Clinical Impressions(s) / ED Diagnoses   Final diagnoses:  None    New Prescriptions New Prescriptions   No medications on file      Francine Graven, DO 05/12/16 1909

## 2016-05-07 NOTE — ED Notes (Signed)
CRITICAL VALUE ALERT  Critical value received:  Troponin = 0.67  Date of notification:  05/07/16  Time of notification:  0735  Critical value read back:Yes.    Nurse who received alert:  Rosealee Albee, RN  MD notified (1st page):  [place]  Time of first page:  [phone]  MD notified (2nd page):  Time of second page:  Responding MD:  [place]  Time MD responded:  [phone]

## 2016-05-07 NOTE — Procedures (Signed)
I was present at this dialysis session. I have reviewed the session itself and made appropriate changes.   There were no vitals filed for this visit.   Recent Labs Lab 05/07/16 0656 05/07/16 1126  NA 139  --   K 5.7*  --   CL 99*  --   CO2 28  --   GLUCOSE 108*  --   BUN 91*  --   CREATININE 11.59* 12.18*  CALCIUM 9.0  --      Recent Labs Lab 05/06/16 1435 05/07/16 0656  WBC 9.5 12.0*  NEUTROABS  --  9.9*  HGB 9.3* 9.8*  HCT 28.6* 29.1*  MCV 77.9* 78.9  PLT 160 132*    Scheduled Meds: . albuterol  3 mL Inhalation Q6H  . amLODipine  10 mg Oral q morning - 10a  . aspirin EC  81 mg Oral Daily  . [START ON 05/08/2016] cinacalcet  30 mg Oral Q breakfast  . citalopram  20 mg Oral Daily  . darbepoetin (ARANESP) injection - DIALYSIS  60 mcg Intravenous Q Fri-HD  . docusate sodium  100 mg Oral BID  . [START ON 23-May-202018] doxercalciferol  4 mcg Intravenous Q M,W,F-HD  . famotidine  20 mg Oral Daily  . heparin  5,000 Units Subcutaneous Q8H  . isosorbide mononitrate  60 mg Oral Daily  . latanoprost  1 drop Both Eyes QHS  . [START ON 05/08/2016] levothyroxine  25 mcg Oral QAC breakfast  . mirtazapine  7.5 mg Oral QHS  . polyethylene glycol  17 g Oral Daily  . sevelamer carbonate  2,400 mg Oral TID WC  . sevelamer carbonate  800 mg Oral BID BM  . sodium chloride flush  3 mL Intravenous Q12H  . sodium polystyrene  15 g Oral Once   Continuous Infusions: . sodium chloride     PRN Meds:.sodium chloride, acetaminophen **OR** acetaminophen, alum & mag hydroxide-simeth, ipratropium, morphine injection, nitroGLYCERIN, ondansetron **OR** ondansetron (ZOFRAN) IV, polyvinyl alcohol, sodium chloride flush   Donetta Potts,  MD 05/07/2016, 1:28 PM

## 2016-05-07 NOTE — ED Triage Notes (Addendum)
Pt C/O of chest pain and left sided pain that started tonight. Pt given 81mg  asprin at facility and 243mg  given by EMS.

## 2016-05-07 NOTE — Progress Notes (Signed)
CRITICAL VALUE ALERT  Critical value received:  Troponin 0.48  Date of notification:  05/07/16  Time of notification:  1258  Critical value read back:Yes.    Nurse who received alert:  Kristin Bruins RN  MD notified (1st page):  Dr. Tyrell Antonio  Time of first page:    MD notified (2nd page):  Time of second page:  Responding MD:   Time MD responded:    Pt in HD. Pt chest pain free. Troponin decreased from previous.

## 2016-05-07 NOTE — Consult Note (Signed)
Cardiology Consult    Patient ID: [name] MRN: [id number], DOB/AGE: 1925/07/18   Admit date: 05/07/2016 Date of Consult: 05/07/2016  Primary Physician: Vic Blackbird, MD Reason for Consult: Chest pain Primary Cardiologist: Dr. Bronson Ing Requesting Provider: Dr. Tyrell Antonio  History of Present Illness    [name] is a 83 y.o. female who is being seen today for the evaluation of chest pain at the request of Dr. Tyrell Antonio. The patient has a past medical history significant for ESRD on hemodialysis, M-W-F, recent declot of left arm fistula as an outpatient on 5/63, chronic diastolic heart failure, diabetes, COPD on home oxygen,  hypertension, hyperlipidemia, stroke with right-sided weakness, SVT and gout. No history of MI or previous cardiac catheterizations.  She is seen at dialysis. She missed a dialysis treatment due to clotting of her fistula. She presented yesterday evening after having her fistula declotted as an outpatient yesterday with chest pain and shortness of breath. She tells me that her primary concern is for difficulty swallowing. She can swallow but the food gets stuck just below her throat. She got choked a few days ago while eating and today she vomited after being unable to pass her food down the esophagus. She gets a full feeling in her upper chest after eating. She also states that her voice is abnormal. This has been occurring for several days.  She also has left sided chest pain that is in the subaxillary region and is sometimes worse with deep breath or cough. She has increased DOE. She lives in ALF and walks with a walker. Recently has been more short of breath and fatigued. She also has orthopnea. No edema.   The patient was seen in the emergency department on 04/22/16 for shortness of breath and found to have some wheezing. She was treated with nebulizer treatment and Solu-Medrol with improvement. Her oxygen level remained 100% on room air and she was  scheduled for dialysis the next day. BNP is chronically elevated likely related to end-stage renal disease.  Troponins 0.67, 0.67, 0.46  CXR: Persistent elevation of the left hemidiaphragm with associated subsegmental atelectasis of the left lung base. A small left sided pleural effusion or left lung base infiltrate is not entirely excluded. Clinical correlation is recommended. Mild central vascular prominence similar to prior studies may represent mild vascular congestion.  Echocardiogram 05/27/15 showed normal LV systolic function, EF 14-97%, moderate LVH, grade 1 diastolic dysfunction with high filling pressures, mild AS  Nuclear stress test 05/27/15 findings include LVH and repolarization abnormalities, normal study with no myocardial ischemia or scar and EF 65%   Past Medical History   Past Medical History:  Diagnosis Date  . Anemia   . Anemia in CKD (chronic kidney disease)   . Anxiety   . Arthritis    gout, right shoulder  . Cancer [facility])    ?stomach cancer   . Cataracts, both eyes   . CHF (congestive heart failure) ([place])   . CKD (chronic kidney disease), stage V ([place])    on HD on M/W/F  . Depression   . Diabetes mellitus, type 2 ([place])   . GERD (gastroesophageal reflux disease)   . Gynecologic cancer   . Hyperlipidemia   . Hypertension   . IBS (irritable bowel syndrome)    with costipation  . Intraoperative hemorrhage and hematoma of a digestive system organ or structure complicating a digestive system procedure   . Normal cardiac stress test 05/2015  . Overactive bladder   .  Pneumonia    2 times in 2016  . SOB (shortness of breath)    nml spirometry 09/05/06  . Stroke [facility])    residual right side weakness and pain  . SVT (supraventricular tachycardia) ([place])   . Syncope and collapse   . Thyroid disease     Past Surgical History:  Procedure Laterality Date  . AV FISTULA PLACEMENT, BRACHIOCEPHALIC  66/44/0347   left arm   by Dr. Bridgett Larsson  . COLONOSCOPY    . FISTULA  SUPERFICIALIZATION Left 06/10/2015   Procedure: LEFT UPPER ARM BRACHIOCEPHALIC FISTULA PSEUDOANEURYSM PLICATION;  Surgeon: Conrad [place], MD;  Location: [place];  Service: Vascular;  Laterality: Left;  . IR THROMBECTOMY AV FISTULA W/THROMBOLYSIS/PTA INC/SHUNT/IMG LEFT Left 05/06/2016  . IR US GUIDE VASC ACCESS LEFT  05/06/2016  . PERIPHERAL VASCULAR CATHETERIZATION Left 05/15/2015   Procedure: Fistulagram;  Surgeon: Conrad [place], MD;  Location: [facility];  Service: Cardiovascular;  Laterality: Left;  . TEE WITHOUT CARDIOVERSION  2011   2/2 Strep Bovis infection  . VESICOVAGINAL FISTULA CLOSURE W/ TAH       Allergies  No Known Allergies  Inpatient Medications    . albuterol  3 mL Inhalation Q6H  . amLODipine  10 mg Oral q morning - 10a  . aspirin EC  81 mg Oral Daily  . [START ON 05/08/2016] cinacalcet  30 mg Oral Q breakfast  . citalopram  20 mg Oral Daily  . darbepoetin (ARANESP) injection - DIALYSIS  60 mcg Intravenous Q Fri-HD  . docusate sodium  100 mg Oral BID  . [START ON September 29, 202018] doxercalciferol  4 mcg Intravenous Q M,W,F-HD  . famotidine  20 mg Oral Daily  . heparin  5,000 Units Subcutaneous Q8H  . isosorbide mononitrate  60 mg Oral Daily  . latanoprost  1 drop Both Eyes QHS  . [START ON 05/08/2016] levothyroxine  25 mcg Oral QAC breakfast  . mirtazapine  7.5 mg Oral QHS  . polyethylene glycol  17 g Oral Daily  . sevelamer carbonate  2,400 mg Oral TID WC  . sevelamer carbonate  800 mg Oral BID BM  . sodium chloride flush  3 mL Intravenous Q12H  . sodium polystyrene  15 g Oral Once    Prior to Admission medications     Medication Sig Start Date End Date Taking? Authorizing Provider  albuterol (PROVENTIL) (2.5 MG/3ML) 0.083% nebulizer solution USE 1 VIAL IN NEBULIZER 3 TIMES DAILY AS NEEDED FOR WHEEZING/SHORTNESS OF BREATH. 10/01/15   Alycia Rossetti, MD  amLODipine (NORVASC) 10 MG tablet TAKE 1 TABLET BY MOUTH EVERY MORNING. 03/24/16   Alycia Rossetti, MD  bisacodyl  (DULCOLAX) 10 MG suppository 1 suppository rectally  Daily every 3 days as needed for constipation 10/17/15   Alycia Rossetti, MD  citalopram (CELEXA) 20 MG tablet TAKE (1) TABLET BY MOUTH ONCE IN THE MORNING. 03/24/16   Alycia Rossetti, MD  guaiFENesin-dextromethorphan (ROBITUSSIN DM) 100-10 MG/5ML syrup TAKE ONE TEASPOONFUL BY MOUTH EVERY 4 HOURS AS NEEDED FOR COUGH 02/17/16   Alycia Rossetti, MD  ipratropium (ATROVENT) 0.02 % nebulizer solution USE 1 VIAL IN NEBULIZER 3 TIMES DAILY AS NEEDED FOR WHEEZING/SHORTNESS OF BREATH. 10/01/15   Alycia Rossetti, MD  isosorbide mononitrate (IMDUR) 60 MG 24 hr tablet TAKE (1) TABLET BY MOUTH ONCE DAILY. 03/24/16   Alycia Rossetti, MD  Lancets MISC Use as directed to monitor FSBS 1x daily x2 weeks, then 1x daily PRN S/Sx hypoglycemia. Dx:  E11.9. 10/09/15   Alycia Rossetti, MD  levothyroxine (SYNTHROID, LEVOTHROID) 25 MCG tablet TAKE (1) TABLET BY MOUTH ONCE DAILY BEFORE BREAKFAST. 03/24/16   Alycia Rossetti, MD  lidocaine-prilocaine (EMLA) cream APPLY TOPICALLY TO LEFT UPPER ARM 1 HOUR PRIOR TO DIALYSIS ON MONDAY,WEDNESDAY & FRIDAYS AS NEEDED FOR PAIN. WRAP WITH PLASTIC WRAP AFTER A 03/23/16   Alycia Rossetti, MD  MAPAP 325 MG tablet TAKE (2) TABLETS FOUR TIMES DAILY AS NEEDED FOR PAIN. -MAY MAKE DROWSY - 04/22/16   Alycia Rossetti, MD  mirtazapine (REMERON) 15 MG tablet TAKE 1/2 TABLET (7.5MG ) BY MOUTH AT BEDTIME FOR MOOD/APPETITE. 03/24/16   Alycia Rossetti, MD  Multiple Vitamin (DAILY-VITE) TABS TAKE 1 TABLET BY MOUTH ONCE DAILY. (ON DAY OF DIALYSIS, TAKE AFTER DIALYSIS) 03/24/16   Alycia Rossetti, MD  nitroGLYCERIN (NITROSTAT) 0.4 MG SL tablet DISSOLVE 1 TABLET UNDER THE TONGUE EVERY 5 MINUTES X2 DOSES THEN TO ER 06/06/15   Alycia Rossetti, MD  OXYGEN Inhale 2 L into the lungs daily as needed (shortness of breath).    Historical Provider, MD  polyethylene glycol powder (MIRALAX) powder Take 17 g by mouth daily. Hold for diarrhea 02/28/15   Alycia Rossetti, MD   polyvinyl alcohol (LIQUIFILM TEARS) 1.4 % ophthalmic solution 1 drop as needed for dry eyes.    Historical Provider, MD  QC LO-DOSE ASPIRIN 81 MG EC tablet TAKE (1) TABLET BY MOUTH ONCE DAILY. 03/24/16   Alycia Rossetti, MD  ranitidine (ZANTAC) 150 MG tablet TAKE 1 TABLET BY MOUTH AT BEDTIME. 03/24/16   Alycia Rossetti, MD  SENSIPAR 30 MG tablet TAKE 1 TABLET BY MOUTH ONCE DAILY AFTER THE EVENING MEAL 08/19/15   Alycia Rossetti, MD  sevelamer (RENVELA) 800 MG tablet Take 800-2,400 mg by mouth 5 (five) times daily. 2400mg  three times daily with meals and 800mg  twice daily with snacks    Historical Provider, MD  STOOL SOFTENER 100 MG capsule TAKE 1 CAPSULE BY MOUTH TWICE DAILY. 03/24/16   Alycia Rossetti, MD  Travoprost, BAK Free, (TRAVATAN) 0.004 % SOLN ophthalmic solution Place 1 drop into both eyes at bedtime. 02/18/16   Alycia Rossetti, MD  VENTOLIN HFA 108 (90 Base) MCG/ACT inhaler INHALE (2) PUFFS EVERY 4 HOURS AS NEEDED FOR WHEEZING OR SHORTNESS OFBREATH(COUGH). 03/25/16   Alycia Rossetti, MD    Family History    Family History  Problem Relation Age of Onset  . Stroke Father   . Hypertension Sister   . Heart failure Sister   . Cancer Brother     Social History    Social History   Social History  . Marital status: Widowed    Spouse name: N/A  . Number of children: N/A  . Years of education: N/A   Occupational History  . Not on file.   Social History Main Topics  . Smoking status: Never Smoker  . Smokeless tobacco: Never Used  . Alcohol use No  . Drug use: No  . Sexual activity: No   Other Topics Concern  . Not on file   Social History Narrative   The patient is one of 8 children and she does not know much of the specifics of her family medical history. She has 10 children and so many grandchildren and great grandchildren that she can't count. She has been on dialysis and living at an assisted living center for the past 6 years.      Review of Systems  General:   No chills, fever, night sweats or weight changes.  Cardiovascular:  Sharp left sided chest pain, dyspnea on exertion and orthopnea, No edema, palpitations, paroxysmal nocturnal dyspnea. Dermatological: No rash, lesions/masses Respiratory: Productive cough with white sputum, dyspnea on exertion Urologic: No hematuria, dysuria Abdominal:  Trouble swallowing- food getting stuck and causing vomiting Neurologic:  No visual changes, wkns, changes in mental status. All other systems reviewed and are otherwise negative except as noted above.  Physical Exam   Blood pressure (!) 157/56, pulse 83, temperature 98.1 F (36.7 C), temperature source Oral, resp. rate 18, last menstrual period 12/24/2010, SpO2 100 %.  General: Pleasant, obese AA female, NAD Psych: Normal affect. Neuro: Alert and oriented X 3. Moves all extremities spontaneously. HEENT: Normal  Neck: Supple without bruits or JVD. Lungs:  Resp regular and unlabored, CTA. Heart: RRR no s3, s4, or murmurs. Abdomen: Soft, non-tender, non-distended, BS + x 4.  Extremities: No clubbing, cyanosis or edema. DP/PT/Radials 2+ and equal bilaterally. Left arm with dialysis fistula  Labs    Troponin (Point of Care Test) No results for input(s): TROPIPOC in the last 72 hours.  Recent Labs  05/07/16 0656 05/07/16 0852 05/07/16 1126  TROPONINI 0.67* 0.67* 0.46*   Lab Results  Component Value Date   WBC 12.0 (H) 05/07/2016   HGB 9.8 (L) 05/07/2016   HCT 29.1 (L) 05/07/2016   MCV 78.9 05/07/2016   PLT 132 (L) 05/07/2016     Recent Labs Lab 05/07/16 0656 05/07/16 1126  NA 139  --   K 5.7*  --   CL 99*  --   CO2 28  --   BUN 91*  --   CREATININE 11.59* 12.18*  CALCIUM 9.0  --   GLUCOSE 108*  --    Lab Results  Component Value Date   CHOL 136 02/12/2014   HDL 54 02/12/2014   LDLCALC 67 02/12/2014   TRIG 75 02/12/2014   Lab Results  Component Value Date   DDIMER 0.97 (H) 10/02/2015     Radiology Studies    Ir US Guide  Vasc Access Left  Result Date: 05/07/2016 CLINICAL DATA:  End-stage renal disease. Occluded left upper arm if AV dialysis fistula. EXAM: DIALYSIS FISTULA DECLOT VENOUS ANGIOPLASTY ULTRASOUND GUIDANCE FOR VASCULAR ACCESS FLUOROSCOPY TIME:  5.7 minutes, 51  uGym2 DAP TECHNIQUE: The procedure, risks (including but not limited to bleeding, infection, organ damage ), benefits, and alternatives were explained to the patient. Questions regarding the procedure were encouraged and answered. The patient understands and consents to the procedure. Intravenous Fentanyl and Versed were administered as conscious sedation during continuous monitoring of the patient's level of consciousness and physiological / cardiorespiratory status by the radiology RN, with a total moderate sedation time of 38 minutes. The outflow vein of the fistula just central to the anastomosis was accessed antegrade with 21-gauge micropuncture needle under real-time ultrasonic guidance after the overlying skin prepped with Betadine, draped in usual sterile fashion, infiltrated locally with 1%lidocaine. Needle exchanged over 018guidewire for transitional dilator through which a Bentson wire was advanced to . Over this a 28F sheath was placed, through which a 5 [place] Kumpe catheter was advanced, negotiated through the outflow vein. 2 mg tPA were administered within the thrombosed segment of the outflow vein. Once the catheter advanced centrally outflow venography was performed demonstrating patency of the central venous system through the SVC . 3000 units heparin were administered IV. The Kumpe was exchanged over an Amplatz guide wire for the  AngioJet device, used to mechanically thrombolyse the outflow cephalic vein and remove the thrombus from the native outflow. This was exchanged for a 7 mm x 4 cm Conquest angioplasty balloon, advanced to the outflow vein. Angioplasty of the cephalic arch was performed. Injection showed antegrade flow with partial  clearance of thrombus from the graft. No extravasation or apparent complication of angioplasty. Reflux injection showed no significant stenosis at the anastomosis. The opacified native arterial systems unremarkable. Additional passes of the Angiojet catheter cleared some of the residual mural thrombus in the dilated outflow cephalic vein. Follow up shuntogram showed good flow through the outflow vein, no extravasation. She The catheter and sheath was then removed and hemostasis achieved with 2-0 Ethilon suture. Patient tolerated procedure well. IMPRESSION: 1. Technically successful declot of left upper arm hemodialysis fistula. 2. Technically successful balloon angioplasty of central aspect of outflow cephalic vein. ACCESS: Remains approachable for percutaneous intervention as needed. Electronically Signed   By: Lucrezia Europe M.D.   On: 05/06/2016 17:11   Dg Chest Port 1 View  Result Date: 05/07/2016 CLINICAL DATA:  83 year old female with left-sided chest pain. EXAM: PORTABLE CHEST 1 VIEW COMPARISON:  Chest radiograph dated 04/22/2016 and 12/11/2015 FINDINGS: There is eventration of the left hemidiaphragm similar to the prior radiographs. A small left pleural effusion or left lung base infiltrate is not entirely excluded. Clinical correlation is recommended. The right lung is clear. There is no pneumothorax. There is stable cardiac silhouette. Atherosclerotic calcification of the aortic arch. Mild central vascular prominence as seen on the prior radiograph may represent mild vascular congestion. There is degenerative changes of the shoulders. No acute osseous pathology. IMPRESSION: 1. Persistent elevation of the left hemidiaphragm with associated subsegmental atelectasis of the left lung base. A small left sided pleural effusion or left lung base infiltrate is not entirely excluded. Clinical correlation is recommended. 2. Mild central vascular prominence similar to prior studies may represent mild vascular  congestion. Electronically Signed   By: Anner Crete M.D.   On: 05/07/2016 06:59   Dg Chest Portable 1 View  Result Date: 04/22/2016 CLINICAL DATA:  Shortness of breath since yesterday. EXAM: PORTABLE CHEST 1 VIEW COMPARISON:  12/11/2015 FINDINGS: Chronic cardiomegaly. Chronic aortic atherosclerosis. Poor inspiration. Possible pulmonary venous hypertension without frank edema. No visible effusion. IMPRESSION: Chronic cardiomegaly and aortic atherosclerosis. Possible pulmonary venous hypertension. Electronically Signed   By: Nelson Chimes M.D.   On: 04/22/2016 12:27   Ir Thrombectomy Av Fistula W/thrombolysis/pta Inc/shunt/img Left  Result Date: 05/06/2016 CLINICAL DATA:  End-stage renal disease. Occluded left upper arm if AV dialysis fistula. EXAM: DIALYSIS FISTULA DECLOT VENOUS ANGIOPLASTY ULTRASOUND GUIDANCE FOR VASCULAR ACCESS FLUOROSCOPY TIME:  5.7 minutes, 44  uGym2 DAP TECHNIQUE: The procedure, risks (including but not limited to bleeding, infection, organ damage ), benefits, and alternatives were explained to the patient. Questions regarding the procedure were encouraged and answered. The patient understands and consents to the procedure. Intravenous Fentanyl and Versed were administered as conscious sedation during continuous monitoring of the patient's level of consciousness and physiological / cardiorespiratory status by the radiology RN, with a total moderate sedation time of 38 minutes. The outflow vein of the fistula just central to the anastomosis was accessed antegrade with 21-gauge micropuncture needle under real-time ultrasonic guidance after the overlying skin prepped with Betadine, draped in usual sterile fashion, infiltrated locally with 1%lidocaine. Needle exchanged over 018guidewire for transitional dilator through which a Bentson wire was advanced to . Over this a 43F sheath was placed, through  which a 5 [place] Kumpe catheter was advanced, negotiated through the outflow vein. 2 mg  tPA were administered within the thrombosed segment of the outflow vein. Once the catheter advanced centrally outflow venography was performed demonstrating patency of the central venous system through the SVC . 3000 units heparin were administered IV. The Kumpe was exchanged over an Amplatz guide wire for the AngioJet device, used to mechanically thrombolyse the outflow cephalic vein and remove the thrombus from the native outflow. This was exchanged for a 7 mm x 4 cm Conquest angioplasty balloon, advanced to the outflow vein. Angioplasty of the cephalic arch was performed. Injection showed antegrade flow with partial clearance of thrombus from the graft. No extravasation or apparent complication of angioplasty. Reflux injection showed no significant stenosis at the anastomosis. The opacified native arterial systems unremarkable. Additional passes of the Angiojet catheter cleared some of the residual mural thrombus in the dilated outflow cephalic vein. Follow up shuntogram showed good flow through the outflow vein, no extravasation. She The catheter and sheath was then removed and hemostasis achieved with 2-0 Ethilon suture. Patient tolerated procedure well. IMPRESSION: 1. Technically successful declot of left upper arm hemodialysis fistula. 2. Technically successful balloon angioplasty of central aspect of outflow cephalic vein. ACCESS: Remains approachable for percutaneous intervention as needed. Electronically Signed   By: Lucrezia Europe M.D.   On: 05/06/2016 17:11    EKG & Cardiac Imaging   EKG: Sinus or ectopic atrial tachycardia, 101 bpm, Abnormal R-wave progression, early transition, LVH with secondary repolarization abnormality, TWI V4-V6 and non-specific T wave abnormality inferiorly previously presnet on EKG 04/22/16, QTC 493  Echocardiogram:  05/27/15 Study Conclusions  - Left ventricle: The cavity size was normal. Wall thickness was   increased in a pattern of moderate LVH. Systolic function  was   normal. The estimated ejection fraction was in the range of 60%   to 65%. Wall motion was normal; there were no regional wall   motion abnormalities. Doppler parameters are consistent with   abnormal left ventricular relaxation (grade 1 diastolic   dysfunction). Doppler parameters are consistent with high   ventricular filling pressure. - Aortic valve: Morphologically, there is at least mild aortic   stenosis if not mild to moderate. Moderately calcified annulus.   Mildly thickened, mildly calcified leaflets. Cusp separation was   reduced. - Mitral valve: Mildly calcified annulus. - Left atrium: The atrium was moderately dilated.  Assessment & Plan    Chest pain -No previous history of MI and no previous cardiac catheterizations. Normal stress test in 05/2015. -Pt with some atypical features- sharp in left side chest pain occ worse with cough and deep breathing and also upper chest discomfort with trouble swallowing food.  She also has increase in DOE, orthopnea and fatigue. -Troponins 0.67, 0.67, 0.46. Mildly elevated with no rise.  -EKG without acute changes- T wave changes previously present -Chest pain may be cardiac in nature or possible MSK or related to difficulty swallowing and feeling that her food is getting stuck. Will discuss with Dr. Percival Spanish.  Difficulty swallowing -The patient complains of recent development of difficulty swallowing with food getting stuck. She may need investigation of her swallowing  Chronic diastolic heart failure -Volume management per dialysis -Chest x-ray with no overt edema -No edema, but pt has increase in DOE and fatigue  ESRD -Dialysis M-W-F. Declotting of left arm fistula yesterday. -Management per nephrology.   Essential hypertension -Home management includes amlodipine 10 mg daily, Imdur 60 mg daily -BP  is elevated. She is having dialysis today which hopefully will help  Mild aortic stenosis -Stable, monitoring  yearly  Diabetes type 2 -No longer on medications. Last A1c 5.9 on 10/02/2015  SignedDaune Perch, NP-C 05/07/2016, 2:49 PM Pager: [phone]   History and all data above reviewed.  Patient examined.  I agree with the findings as above.  Presented after having difficulty with a clotted dialysis graft and missing a session of dialysis.  This morning she had diarrhea and complained of pain in the left upper abdominal quadrant.  She has an abnormal EKG which is unchanged from previous. We are asked to see because of the enzymes and pain.  She does have significant cardiovascular risk factors but had a negative Lexiscan Myoview last year.  She has otherwise been doing OK walking around with her walker at her nursing home.  The patient denies any new symptoms such as chest discomfort, neck or arm discomfort. There has been no new shortness of breath, PND or orthopnea. There have been no reported palpitations, presyncope or syncope.  She does move slowly.  She was noted at [facility] to have elevated troponins.  BNP was also elevated.  The patient exam reveals COR:RRR  ,  Lungs: Clear  ,  Abd: Positive bowel sounds, no rebound no guarding, Ext No edema  .  All available labs, radiology testing, previous records reviewed. Agree with documented assessment and plan.   Pain:  Her pain is abdominal and not chest.  She does have elevated troponin which could be related to fluid retention with a missed dialysis session on top of renal insufficiency and HTN.   No further cardiac work up is indicated.  I would suggest a GI evaluation given her difficulty with swallowing and previous need for esophageal stretching.   QT prolonged:  Avoid QT prolonging medications.    Jeneen Rinks Tajha Sammarco  4:22 PM  05/07/2016

## 2016-05-07 NOTE — Progress Notes (Addendum)
Pt received from [facility] via [facility]. Pt oriented to room and equipment. Daughter given update. VSS. Telemetry applied, CCMD notified.   Paged cardiology and nephrology per Dr. Paulina Fusi request.  Fritz Pickerel, RN

## 2016-05-07 NOTE — Consult Note (Signed)
[facility] Renal Consultation Note    Indication for Consultation:  Management of ESRD/hemodialysis; anemia, hypertension/volume and secondary hyperparathyroidism  HPI: [name] is a 83 y.o. female.  [name] has a PMH signficant for ESRD, HTN, DM, hyperlipidemia, CVA, and CHF who presented to an outside hospital with substernal chest pain and sob.  The chest pain woke her from sleep.  She went to ED and was found to have an elevated troponin and inverted ST on lead V3.  She was transferred to [facility] for cardiac evaluation.  Of note, she missed HD on 05/05/16 due to clotted LUE AVG and is s/p declot 05/06/16 with Dr. Vernard Gambles.  She was also noted to have an elevated K of 5.7 and is only 2kg above her edw of 85kg.  She reports that she is "hungry enough to fight someone".  Currently chest pain free.  Past Medical History:  Diagnosis Date  . Anemia   . Anemia in CKD (chronic kidney disease)   . Anxiety   . Arthritis    gout, right shoulder  . Cancer [facility])    ?stomach cancer   . Cataracts, both eyes   . CHF (congestive heart failure) ([place])   . CKD (chronic kidney disease), stage V ([place])    on HD on M/W/F  . Depression   . Diabetes mellitus, type 2 ([place])   . GERD (gastroesophageal reflux disease)   . Gynecologic cancer   . Hyperlipidemia   . Hypertension   . IBS (irritable bowel syndrome)    with costipation  . Intraoperative hemorrhage and hematoma of a digestive system organ or structure complicating a digestive system procedure   . Normal cardiac stress test 05/2015  . Overactive bladder   . Pneumonia    2 times in 2016  . SOB (shortness of breath)    nml spirometry 09/05/06  . Stroke [facility])    residual right side weakness and pain  . SVT (supraventricular tachycardia) ([place])   . Syncope and collapse   . Thyroid disease    Past Surgical History:  Procedure Laterality Date  . AV FISTULA PLACEMENT, BRACHIOCEPHALIC  95/09/3265   left arm   by Dr. Bridgett Larsson  .  COLONOSCOPY    . FISTULA SUPERFICIALIZATION Left 06/10/2015   Procedure: LEFT UPPER ARM BRACHIOCEPHALIC FISTULA PSEUDOANEURYSM PLICATION;  Surgeon: Conrad Honalo, MD;  Location: [place];  Service: Vascular;  Laterality: Left;  . IR THROMBECTOMY AV FISTULA W/THROMBOLYSIS/PTA INC/SHUNT/IMG LEFT Left 05/06/2016  . IR US GUIDE VASC ACCESS LEFT  05/06/2016  . PERIPHERAL VASCULAR CATHETERIZATION Left 05/15/2015   Procedure: Fistulagram;  Surgeon: Conrad Gary, MD;  Location: [place] [facility];  Service: Cardiovascular;  Laterality: Left;  . TEE WITHOUT CARDIOVERSION  2011   2/2 Strep Bovis infection  . VESICOVAGINAL FISTULA CLOSURE W/ TAH     Family History:   No family history on file. Social History:  reports that she has never smoked. She has never used smokeless tobacco. She reports that she does not drink alcohol or use drugs. No Known Allergies Prior to Admission medications   Medication Sig Start Date End Date Taking? Authorizing Provider  albuterol (PROVENTIL) (2.5 MG/3ML) 0.083% nebulizer solution USE 1 VIAL IN NEBULIZER 3 TIMES DAILY AS NEEDED FOR WHEEZING/SHORTNESS OF BREATH. 10/01/15   Alycia Rossetti, MD  amLODipine (NORVASC) 10 MG tablet TAKE 1 TABLET BY MOUTH EVERY MORNING. 03/24/16   Alycia Rossetti, MD  bisacodyl (DULCOLAX) 10 MG suppository 1 suppository  rectally  Daily every 3 days as needed for constipation 10/17/15   Alycia Rossetti, MD  citalopram (CELEXA) 20 MG tablet TAKE (1) TABLET BY MOUTH ONCE IN THE MORNING. 03/24/16   Alycia Rossetti, MD  guaiFENesin-dextromethorphan (ROBITUSSIN DM) 100-10 MG/5ML syrup TAKE ONE TEASPOONFUL BY MOUTH EVERY 4 HOURS AS NEEDED FOR COUGH 02/17/16   Alycia Rossetti, MD  ipratropium (ATROVENT) 0.02 % nebulizer solution USE 1 VIAL IN NEBULIZER 3 TIMES DAILY AS NEEDED FOR WHEEZING/SHORTNESS OF BREATH. 10/01/15   Alycia Rossetti, MD  isosorbide mononitrate (IMDUR) 60 MG 24 hr tablet TAKE (1) TABLET BY MOUTH ONCE DAILY. 03/24/16   Alycia Rossetti, MD   Lancets MISC Use as directed to monitor FSBS 1x daily x2 weeks, then 1x daily PRN S/Sx hypoglycemia. Dx: E11.9. 10/09/15   Alycia Rossetti, MD  levothyroxine (SYNTHROID, LEVOTHROID) 25 MCG tablet TAKE (1) TABLET BY MOUTH ONCE DAILY BEFORE BREAKFAST. 03/24/16   Alycia Rossetti, MD  lidocaine-prilocaine (EMLA) cream APPLY TOPICALLY TO LEFT UPPER ARM 1 HOUR PRIOR TO DIALYSIS ON MONDAY,WEDNESDAY & FRIDAYS AS NEEDED FOR PAIN. WRAP WITH PLASTIC WRAP AFTER A 03/23/16   Alycia Rossetti, MD  MAPAP 325 MG tablet TAKE (2) TABLETS FOUR TIMES DAILY AS NEEDED FOR PAIN. -MAY MAKE DROWSY - 04/22/16   Alycia Rossetti, MD  mirtazapine (REMERON) 15 MG tablet TAKE 1/2 TABLET (7.5MG ) BY MOUTH AT BEDTIME FOR MOOD/APPETITE. 03/24/16   Alycia Rossetti, MD  Multiple Vitamin (DAILY-VITE) TABS TAKE 1 TABLET BY MOUTH ONCE DAILY. (ON DAY OF DIALYSIS, TAKE AFTER DIALYSIS) 03/24/16   Alycia Rossetti, MD  nitroGLYCERIN (NITROSTAT) 0.4 MG SL tablet DISSOLVE 1 TABLET UNDER THE TONGUE EVERY 5 MINUTES X2 DOSES THEN TO ER 06/06/15   Alycia Rossetti, MD  OXYGEN Inhale 2 L into the lungs daily as needed (shortness of breath).    Historical Provider, MD  polyethylene glycol powder (MIRALAX) powder Take 17 g by mouth daily. Hold for diarrhea 02/28/15   Alycia Rossetti, MD  polyvinyl alcohol (LIQUIFILM TEARS) 1.4 % ophthalmic solution 1 drop as needed for dry eyes.    Historical Provider, MD  QC LO-DOSE ASPIRIN 81 MG EC tablet TAKE (1) TABLET BY MOUTH ONCE DAILY. 03/24/16   Alycia Rossetti, MD  ranitidine (ZANTAC) 150 MG tablet TAKE 1 TABLET BY MOUTH AT BEDTIME. 03/24/16   Alycia Rossetti, MD  SENSIPAR 30 MG tablet TAKE 1 TABLET BY MOUTH ONCE DAILY AFTER THE EVENING MEAL 08/19/15   Alycia Rossetti, MD  sevelamer (RENVELA) 800 MG tablet Take 800-2,400 mg by mouth 5 (five) times daily. 2400mg  three times daily with meals and 800mg  twice daily with snacks    Historical Provider, MD  STOOL SOFTENER 100 MG capsule TAKE 1 CAPSULE BY MOUTH TWICE  DAILY. 03/24/16   Alycia Rossetti, MD  Travoprost, BAK Free, (TRAVATAN) 0.004 % SOLN ophthalmic solution Place 1 drop into both eyes at bedtime. 02/18/16   Alycia Rossetti, MD  VENTOLIN HFA 108 (90 Base) MCG/ACT inhaler INHALE (2) PUFFS EVERY 4 HOURS AS NEEDED FOR WHEEZING OR SHORTNESS OFBREATH(COUGH). 03/25/16   Alycia Rossetti, MD   Current Facility-Administered Medications  Medication Dose Route Frequency Provider Last Rate Last Dose  . 0.9 %  sodium chloride infusion  250 mL Intravenous PRN Belkys A Regalado, MD      . acetaminophen (TYLENOL) tablet 650 mg  650 mg Oral Q6H PRN Elmarie Shiley, MD  Or  . acetaminophen (TYLENOL) suppository 650 mg  650 mg Rectal Q6H PRN Belkys A Regalado, MD      . albuterol (PROVENTIL) (2.5 MG/3ML) 0.083% nebulizer solution 3 mL  3 mL Inhalation Q6H Belkys A Regalado, MD      . alum & mag hydroxide-simeth (MAALOX/MYLANTA) 200-200-20 MG/5ML suspension 30 mL  30 mL Oral PRN Belkys A Regalado, MD      . amLODipine (NORVASC) tablet 10 mg  10 mg Oral q morning - 10a Belkys A Regalado, MD      . aspirin EC tablet 81 mg  81 mg Oral Daily Belkys A Regalado, MD      . Derrill Memo ON 05/08/2016] cinacalcet (SENSIPAR) tablet 30 mg  30 mg Oral Q breakfast Belkys A Regalado, MD      . citalopram (CELEXA) tablet 20 mg  20 mg Oral Daily Belkys A Regalado, MD   20 mg at 05/07/16 1225  . docusate sodium (COLACE) capsule 100 mg  100 mg Oral BID Belkys A Regalado, MD      . famotidine (PEPCID) tablet 20 mg  20 mg Oral Daily Belkys A Regalado, MD   20 mg at 05/07/16 1224  . heparin injection 5,000 Units  5,000 Units Subcutaneous Q8H Belkys A Regalado, MD      . ipratropium (ATROVENT) nebulizer solution 0.5 mg  0.5 mg Nebulization Q6H PRN Belkys A Regalado, MD      . isosorbide mononitrate (IMDUR) 24 hr tablet 60 mg  60 mg Oral Daily Belkys A Regalado, MD      . latanoprost (XALATAN) 0.005 % ophthalmic solution 1 drop  1 drop Both Eyes QHS Belkys A Regalado, MD      . Derrill Memo ON  05/08/2016] levothyroxine (SYNTHROID, LEVOTHROID) tablet 25 mcg  25 mcg Oral QAC breakfast Belkys A Regalado, MD      . mirtazapine (REMERON) tablet 7.5 mg  7.5 mg Oral QHS Belkys A Regalado, MD      . morphine 4 MG/ML injection 0.52 mg  0.52 mg Intravenous Q4H PRN Belkys A Regalado, MD      . nitroGLYCERIN (NITROSTAT) SL tablet 0.4 mg  0.4 mg Sublingual Q5 min PRN Belkys A Regalado, MD      . ondansetron (ZOFRAN) tablet 4 mg  4 mg Oral Q6H PRN Belkys A Regalado, MD       Or  . ondansetron (ZOFRAN) injection 4 mg  4 mg Intravenous Q6H PRN Belkys A Regalado, MD      . polyethylene glycol (MIRALAX / GLYCOLAX) packet 17 g  17 g Oral Daily Belkys A Regalado, MD      . polyvinyl alcohol (LIQUIFILM TEARS) 1.4 % ophthalmic solution 1 drop  1 drop Both Eyes PRN Belkys A Regalado, MD      . sevelamer carbonate (RENVELA) tablet 2,400 mg  2,400 mg Oral TID WC Belkys A Regalado, MD      . sevelamer carbonate (RENVELA) tablet 800 mg  800 mg Oral BID BM Belkys A Regalado, MD      . sodium chloride flush (NS) 0.9 % injection 3 mL  3 mL Intravenous Q12H Belkys A Regalado, MD   3 mL at 05/07/16 1130  . sodium chloride flush (NS) 0.9 % injection 3 mL  3 mL Intravenous PRN Belkys A Regalado, MD      . sodium polystyrene (KAYEXALATE) 15 GM/60ML suspension 15 g  15 g Oral Once Elmarie Shiley, MD       Labs: Basic  Metabolic Panel:  Recent Labs Lab 05/06/16 1435 05/07/16 0656 05/07/16 1126  NA 139 139  --   K 4.9 5.7*  --   CL 97* 99*  --   CO2 30 28  --   GLUCOSE 106* 108*  --   BUN 78* 91*  --   CREATININE 10.74* 11.59* 12.18*  CALCIUM 9.0 9.0  --    Liver Function Tests: No results for input(s): AST, ALT, ALKPHOS, BILITOT, PROT, ALBUMIN in the last 168 hours. No results for input(s): LIPASE, AMYLASE in the last 168 hours. No results for input(s): AMMONIA in the last 168 hours. CBC:  Recent Labs Lab 05/06/16 1435 05/07/16 0656  WBC 9.5 12.0*  NEUTROABS  --  9.9*  HGB 9.3* 9.8*  HCT 28.6*  29.1*  MCV 77.9* 78.9  PLT 160 132*   Cardiac Enzymes:  Recent Labs Lab 05/07/16 0656 05/07/16 0852 05/07/16 1126  TROPONINI 0.67* 0.67* 0.46*   CBG: No results for input(s): GLUCAP in the last 168 hours. Iron Studies: No results for input(s): IRON, TIBC, TRANSFERRIN, FERRITIN in the last 72 hours. Studies/Results: Ir US Guide Vasc Access Left  Result Date: 05/07/2016 CLINICAL DATA:  End-stage renal disease. Occluded left upper arm if AV dialysis fistula. EXAM: DIALYSIS FISTULA DECLOT VENOUS ANGIOPLASTY ULTRASOUND GUIDANCE FOR VASCULAR ACCESS FLUOROSCOPY TIME:  5.7 minutes, 54  uGym2 DAP TECHNIQUE: The procedure, risks (including but not limited to bleeding, infection, organ damage ), benefits, and alternatives were explained to the patient. Questions regarding the procedure were encouraged and answered. The patient understands and consents to the procedure. Intravenous Fentanyl and Versed were administered as conscious sedation during continuous monitoring of the patient's level of consciousness and physiological / cardiorespiratory status by the radiology RN, with a total moderate sedation time of 38 minutes. The outflow vein of the fistula just central to the anastomosis was accessed antegrade with 21-gauge micropuncture needle under real-time ultrasonic guidance after the overlying skin prepped with Betadine, draped in usual sterile fashion, infiltrated locally with 1%lidocaine. Needle exchanged over 018guidewire for transitional dilator through which a Bentson wire was advanced to . Over this a 72F sheath was placed, through which a 5 [place] Kumpe catheter was advanced, negotiated through the outflow vein. 2 mg tPA were administered within the thrombosed segment of the outflow vein. Once the catheter advanced centrally outflow venography was performed demonstrating patency of the central venous system through the SVC . 3000 units heparin were administered IV. The Kumpe was exchanged over an  Amplatz guide wire for the AngioJet device, used to mechanically thrombolyse the outflow cephalic vein and remove the thrombus from the native outflow. This was exchanged for a 7 mm x 4 cm Conquest angioplasty balloon, advanced to the outflow vein. Angioplasty of the cephalic arch was performed. Injection showed antegrade flow with partial clearance of thrombus from the graft. No extravasation or apparent complication of angioplasty. Reflux injection showed no significant stenosis at the anastomosis. The opacified native arterial systems unremarkable. Additional passes of the Angiojet catheter cleared some of the residual mural thrombus in the dilated outflow cephalic vein. Follow up shuntogram showed good flow through the outflow vein, no extravasation. She The catheter and sheath was then removed and hemostasis achieved with 2-0 Ethilon suture. Patient tolerated procedure well. IMPRESSION: 1. Technically successful declot of left upper arm hemodialysis fistula. 2. Technically successful balloon angioplasty of central aspect of outflow cephalic vein. ACCESS: Remains approachable for percutaneous intervention as needed. Electronically Signed   By: Keturah Barre  Vernard Gambles M.D.   On: 05/06/2016 17:11   Dg Chest Port 1 View  Result Date: 05/07/2016 CLINICAL DATA:  83 year old female with left-sided chest pain. EXAM: PORTABLE CHEST 1 VIEW COMPARISON:  Chest radiograph dated 04/22/2016 and 12/11/2015 FINDINGS: There is eventration of the left hemidiaphragm similar to the prior radiographs. A small left pleural effusion or left lung base infiltrate is not entirely excluded. Clinical correlation is recommended. The right lung is clear. There is no pneumothorax. There is stable cardiac silhouette. Atherosclerotic calcification of the aortic arch. Mild central vascular prominence as seen on the prior radiograph may represent mild vascular congestion. There is degenerative changes of the shoulders. No acute osseous pathology.  IMPRESSION: 1. Persistent elevation of the left hemidiaphragm with associated subsegmental atelectasis of the left lung base. A small left sided pleural effusion or left lung base infiltrate is not entirely excluded. Clinical correlation is recommended. 2. Mild central vascular prominence similar to prior studies may represent mild vascular congestion. Electronically Signed   By: Anner Crete M.D.   On: 05/07/2016 06:59   Ir Thrombectomy Av Fistula W/thrombolysis/pta Inc/shunt/img Left  Result Date: 05/06/2016 CLINICAL DATA:  End-stage renal disease. Occluded left upper arm if AV dialysis fistula. EXAM: DIALYSIS FISTULA DECLOT VENOUS ANGIOPLASTY ULTRASOUND GUIDANCE FOR VASCULAR ACCESS FLUOROSCOPY TIME:  5.7 minutes, 50  uGym2 DAP TECHNIQUE: The procedure, risks (including but not limited to bleeding, infection, organ damage ), benefits, and alternatives were explained to the patient. Questions regarding the procedure were encouraged and answered. The patient understands and consents to the procedure. Intravenous Fentanyl and Versed were administered as conscious sedation during continuous monitoring of the patient's level of consciousness and physiological / cardiorespiratory status by the radiology RN, with a total moderate sedation time of 38 minutes. The outflow vein of the fistula just central to the anastomosis was accessed antegrade with 21-gauge micropuncture needle under real-time ultrasonic guidance after the overlying skin prepped with Betadine, draped in usual sterile fashion, infiltrated locally with 1%lidocaine. Needle exchanged over 018guidewire for transitional dilator through which a Bentson wire was advanced to . Over this a 74F sheath was placed, through which a 5 [place] Kumpe catheter was advanced, negotiated through the outflow vein. 2 mg tPA were administered within the thrombosed segment of the outflow vein. Once the catheter advanced centrally outflow venography was performed  demonstrating patency of the central venous system through the SVC . 3000 units heparin were administered IV. The Kumpe was exchanged over an Amplatz guide wire for the AngioJet device, used to mechanically thrombolyse the outflow cephalic vein and remove the thrombus from the native outflow. This was exchanged for a 7 mm x 4 cm Conquest angioplasty balloon, advanced to the outflow vein. Angioplasty of the cephalic arch was performed. Injection showed antegrade flow with partial clearance of thrombus from the graft. No extravasation or apparent complication of angioplasty. Reflux injection showed no significant stenosis at the anastomosis. The opacified native arterial systems unremarkable. Additional passes of the Angiojet catheter cleared some of the residual mural thrombus in the dilated outflow cephalic vein. Follow up shuntogram showed good flow through the outflow vein, no extravasation. She The catheter and sheath was then removed and hemostasis achieved with 2-0 Ethilon suture. Patient tolerated procedure well. IMPRESSION: 1. Technically successful declot of left upper arm hemodialysis fistula. 2. Technically successful balloon angioplasty of central aspect of outflow cephalic vein. ACCESS: Remains approachable for percutaneous intervention as needed. Electronically Signed   By: Lucrezia Europe M.D.   On: 05/06/2016  17:11    ROS: Pertinent items are noted in HPI. Physical Exam: Vitals:   05/07/16 0930 05/07/16 1000 05/07/16 1015 05/07/16 1125  BP: (!) 154/63 (!) 152/60  (!) 157/56  Pulse: 70 76 75 83  Resp: 20 18 (!) 22 18  Temp:    98.1 F (36.7 C)  TempSrc:    Oral  SpO2: 98% 100% 98% 100%      Weight change:  No intake or output data in the 24 hours ending 05/07/16 1313 BP (!) 157/56 (BP Location: Right Arm)   Pulse 83   Temp 98.1 F (36.7 C) (Oral)   Resp 18   LMP 12/24/2010   SpO2 100%  General appearance: alert and no distress Head: Normocephalic, without obvious abnormality,  atraumatic Resp: rales decreased BS at bases, L>R Cardio: regular rate and rhythm and no rub GI: soft, non-tender; bowel sounds normal; no masses,  no organomegaly Extremities: edema trace pretibial edema, LUE AVG +T/B , upper arm is swollen, suture in place in antecubital area Dialysis Access:  Dialysis Orders: [facility]  on MWF . EDW 85kg HD Bath 2K/2.5Ca  Time 210 minutes Heparin 2000 bolus 500/hr. Access LUE AVG (s/p declot 05/06/16)  BFR 350 DFR 600    Hectoral 4 mcg IV/HD Epogen 2600 Units IV/HD    Assessment/Plan: 1.  Chest Pain - resolved with NTG, elevated troponin and ST inversion of lead V3.  Cards consult  2.  ESRD -  Plan for HD today,   Missed HD on Wednesday due to clotted avg s/p declot 05/06/16.   3.  Hypertension/volume  - UF to edw today 4.  Anemia  - continue with procrit 2600 IV with HD 5.  Metabolic bone disease -   Continue outpatient meds 6.  Nutrition - renal diet 7. Vascular access- s/p declot.  Swollen arm and had some difficulty with cannulation.    Donetta Potts, MD [place] Pager [phone] 05/07/2016, 1:13 PM

## 2016-05-07 NOTE — H&P (Signed)
History and Physical    [name] RJJ:[id number] DOB: 1925-03-25 DOA: 05/07/2016  PCP: Vic Blackbird, MD Patient coming from: Home   I have personally briefly reviewed patient's old medical records in [place]  Chief Complaint: Chest Pain   HPI: [name] is a 83 y.o. female with medical history significant of ESRD on hemodialysis, M-W-F, recent Declot left arm Fist[place] outpatient on 4-26, HTN, gout who presents complaining of chest pain. She report that chest pain wake her up last night, pressure like, radiating to her back. She also report SOB with the episode. She report getting more SOB lately on exertion. She is chest pain free after nitroglycerine. She is due for HD today.   ED Course: Presents with chest pain, resolved after nitroglycerin. Found to have increase troponin above baseline, EKG with changes since prior EKG. K at 5.7, bnp 1000, WBC 12, Hb 9.8, chest x ray; chronic elevation of diaphragma, and small left pleural effusion.   Review of Systems: As per HPI otherwise 10 point review of systems negative.   Past Medical History:  Diagnosis Date  . Anemia   . Anemia in CKD (chronic kidney disease)   . Anxiety   . Arthritis    gout, right shoulder  . Cancer [facility])    ?stomach cancer   . Cataracts, both eyes   . CHF (congestive heart failure) ([place])   . CKD (chronic kidney disease), stage V ([place])    on HD on M/W/F  . Depression   . Diabetes mellitus, type 2 ([place])   . GERD (gastroesophageal reflux disease)   . Gynecologic cancer   . Hyperlipidemia   . Hypertension   . IBS (irritable bowel syndrome)    with costipation  . Intraoperative hemorrhage and hematoma of a digestive system organ or structure complicating a digestive system procedure   . Normal cardiac stress test 05/2015  . Overactive bladder   . Pneumonia    2 times in 2016  . SOB (shortness of breath)    nml spirometry 09/05/06  . Stroke [facility])    residual right side weakness and pain    . SVT (supraventricular tachycardia) ([place])   . Syncope and collapse   . Thyroid disease     Past Surgical History:  Procedure Laterality Date  . AV FISTULA PLACEMENT, BRACHIOCEPHALIC  01/60/1093   left arm   by Dr. Bridgett Larsson  . COLONOSCOPY    . FISTULA SUPERFICIALIZATION Left 06/10/2015   Procedure: LEFT UPPER ARM BRACHIOCEPHALIC FISTULA PSEUDOANEURYSM PLICATION;  Surgeon: Conrad [place], MD;  Location: [place];  Service: Vascular;  Laterality: Left;  . IR THROMBECTOMY AV FISTULA W/THROMBOLYSIS/PTA INC/SHUNT/IMG LEFT Left 05/06/2016  . IR US GUIDE VASC ACCESS LEFT  05/06/2016  . PERIPHERAL VASCULAR CATHETERIZATION Left 05/15/2015   Procedure: Fistulagram;  Surgeon: Conrad Loudon, MD;  Location: [place] [facility];  Service: Cardiovascular;  Laterality: Left;  . TEE WITHOUT CARDIOVERSION  2011   2/2 Strep Bovis infection  . VESICOVAGINAL FISTULA CLOSURE W/ TAH       reports that she has never smoked. She has never used smokeless tobacco. She reports that she does not drink alcohol or use drugs.  No Known Allergies  Family History; Father; history of stroke. Mother heart diseases.   Prior to Admission medications   Medication Sig Start Date End Date Taking? Authorizing Provider  albuterol (PROVENTIL) (2.5 MG/3ML) 0.083% nebulizer solution USE 1 VIAL IN NEBULIZER 3 TIMES DAILY AS NEEDED FOR WHEEZING/SHORTNESS  OF BREATH. 10/01/15   Alycia Rossetti, MD  amLODipine (NORVASC) 10 MG tablet TAKE 1 TABLET BY MOUTH EVERY MORNING. 03/24/16   Alycia Rossetti, MD  bisacodyl (DULCOLAX) 10 MG suppository 1 suppository rectally  Daily every 3 days as needed for constipation 10/17/15   Alycia Rossetti, MD  citalopram (CELEXA) 20 MG tablet TAKE (1) TABLET BY MOUTH ONCE IN THE MORNING. 03/24/16   Alycia Rossetti, MD  guaiFENesin-dextromethorphan (ROBITUSSIN DM) 100-10 MG/5ML syrup TAKE ONE TEASPOONFUL BY MOUTH EVERY 4 HOURS AS NEEDED FOR COUGH 02/17/16   Alycia Rossetti, MD  ipratropium (ATROVENT) 0.02 % nebulizer  solution USE 1 VIAL IN NEBULIZER 3 TIMES DAILY AS NEEDED FOR WHEEZING/SHORTNESS OF BREATH. 10/01/15   Alycia Rossetti, MD  isosorbide mononitrate (IMDUR) 60 MG 24 hr tablet TAKE (1) TABLET BY MOUTH ONCE DAILY. 03/24/16   Alycia Rossetti, MD  Lancets MISC Use as directed to monitor FSBS 1x daily x2 weeks, then 1x daily PRN S/Sx hypoglycemia. Dx: E11.9. 10/09/15   Alycia Rossetti, MD  levothyroxine (SYNTHROID, LEVOTHROID) 25 MCG tablet TAKE (1) TABLET BY MOUTH ONCE DAILY BEFORE BREAKFAST. 03/24/16   Alycia Rossetti, MD  lidocaine-prilocaine (EMLA) cream APPLY TOPICALLY TO LEFT UPPER ARM 1 HOUR PRIOR TO DIALYSIS ON MONDAY,WEDNESDAY & FRIDAYS AS NEEDED FOR PAIN. WRAP WITH PLASTIC WRAP AFTER A 03/23/16   Alycia Rossetti, MD  MAPAP 325 MG tablet TAKE (2) TABLETS FOUR TIMES DAILY AS NEEDED FOR PAIN. -MAY MAKE DROWSY - 04/22/16   Alycia Rossetti, MD  mirtazapine (REMERON) 15 MG tablet TAKE 1/2 TABLET (7.5MG ) BY MOUTH AT BEDTIME FOR MOOD/APPETITE. 03/24/16   Alycia Rossetti, MD  Multiple Vitamin (DAILY-VITE) TABS TAKE 1 TABLET BY MOUTH ONCE DAILY. (ON DAY OF DIALYSIS, TAKE AFTER DIALYSIS) 03/24/16   Alycia Rossetti, MD  nitroGLYCERIN (NITROSTAT) 0.4 MG SL tablet DISSOLVE 1 TABLET UNDER THE TONGUE EVERY 5 MINUTES X2 DOSES THEN TO ER 06/06/15   Alycia Rossetti, MD  OXYGEN Inhale 2 L into the lungs daily as needed (shortness of breath).    Historical Provider, MD  polyethylene glycol powder (MIRALAX) powder Take 17 g by mouth daily. Hold for diarrhea 02/28/15   Alycia Rossetti, MD  polyvinyl alcohol (LIQUIFILM TEARS) 1.4 % ophthalmic solution 1 drop as needed for dry eyes.    Historical Provider, MD  predniSONE (DELTASONE) 10 MG tablet Take 2 tablets (20 mg total) by mouth daily. Patient not taking: Reported on 05/06/2016 04/22/16   Milton Ferguson, MD  QC LO-DOSE ASPIRIN 81 MG EC tablet TAKE (1) TABLET BY MOUTH ONCE DAILY. 03/24/16   Alycia Rossetti, MD  ranitidine (ZANTAC) 150 MG tablet TAKE 1 TABLET BY MOUTH AT  BEDTIME. 03/24/16   Alycia Rossetti, MD  SENSIPAR 30 MG tablet TAKE 1 TABLET BY MOUTH ONCE DAILY AFTER THE EVENING MEAL 08/19/15   Alycia Rossetti, MD  sevelamer (RENVELA) 800 MG tablet Take 800-2,400 mg by mouth 5 (five) times daily. 2400mg  three times daily with meals and 800mg  twice daily with snacks    Historical Provider, MD  STOOL SOFTENER 100 MG capsule TAKE 1 CAPSULE BY MOUTH TWICE DAILY. 03/24/16   Alycia Rossetti, MD  Travoprost, BAK Free, (TRAVATAN) 0.004 % SOLN ophthalmic solution Place 1 drop into both eyes at bedtime. 02/18/16   Alycia Rossetti, MD  VENTOLIN HFA 108 (90 Base) MCG/ACT inhaler INHALE (2) PUFFS EVERY 4 HOURS AS NEEDED FOR WHEEZING OR SHORTNESS OFBREATH(COUGH).  03/25/16   Alycia Rossetti, MD    Physical Exam: Vitals:   05/07/16 0636 05/07/16 0700 05/07/16 0842 05/07/16 0900  BP: (!) 180/82  109/60 (!) 154/55  Pulse: 80 (!) 116 73 74  Resp: 18 20 14 19   Temp: 98.5 F (36.9 C)     TempSrc: Oral     SpO2: 100% 100% 100% 100%    Constitutional: NAD, calm, comfortable Vitals:   05/07/16 0636 05/07/16 0700 05/07/16 0842 05/07/16 0900  BP: (!) 180/82  109/60 (!) 154/55  Pulse: 80 (!) 116 73 74  Resp: 18 20 14 19   Temp: 98.5 F (36.9 C)     TempSrc: Oral     SpO2: 100% 100% 100% 100%   Eyes: PERRL, lids and conjunctivae normal ENMT: Mucous membranes are moist. Posterior pharynx clear of any exudate or lesions. Neck: normal, supple, no masses, no thyromegaly Respiratory: decreased breath sounds, Normal respiratory effort. No accessory muscle use.  Cardiovascular: Regular rate and rhythm, no murmurs / rubs / gallops. Positive JVD,  No extremity edema. 2+ pedal pulses. No carotid bruits.  Abdomen: no tenderness, no masses palpated. No hepatosplenomegaly. Bowel sounds positive.  Musculoskeletal: no clubbing / cyanosis. No joint deformity upper and lower extremities. Good ROM, no contractures. Normal muscle tone. Left arm with fistula, mild redness.  Skin: no rashes,  lesions, ulcers. No induration Neurologic: CN 2-12 grossly intact. Sensation intact, DTR normal. Strength 5/5 in all 4.  Psychiatric: Normal judgment and insight. Alert and oriented x 3. Normal mood.     Labs on Admission: I have personally reviewed following labs and imaging studies  CBC:  Recent Labs Lab 05/06/16 1435 05/07/16 0656  WBC 9.5 12.0*  NEUTROABS  --  9.9*  HGB 9.3* 9.8*  HCT 28.6* 29.1*  MCV 77.9* 78.9  PLT 160 673*   Basic Metabolic Panel:  Recent Labs Lab 05/06/16 1435 05/07/16 0656  NA 139 139  K 4.9 5.7*  CL 97* 99*  CO2 30 28  GLUCOSE 106* 108*  BUN 78* 91*  CREATININE 10.74* 11.59*  CALCIUM 9.0 9.0   GFR: Estimated Creatinine Clearance: 3.5 mL/min (A) (by C-G formula based on SCr of 11.59 mg/dL (H)). Liver Function Tests: No results for input(s): AST, ALT, ALKPHOS, BILITOT, PROT, ALBUMIN in the last 168 hours. No results for input(s): LIPASE, AMYLASE in the last 168 hours. No results for input(s): AMMONIA in the last 168 hours. Coagulation Profile:  Recent Labs Lab 05/06/16 1435  INR 1.08   Cardiac Enzymes:  Recent Labs Lab 05/07/16 0656 05/07/16 0852  TROPONINI 0.67* 0.67*   BNP (last 3 results) No results for input(s): PROBNP in the last 8760 hours. HbA1C: No results for input(s): HGBA1C in the last 72 hours. CBG: No results for input(s): GLUCAP in the last 168 hours. Lipid Profile: No results for input(s): CHOL, HDL, LDLCALC, TRIG, CHOLHDL, LDLDIRECT in the last 72 hours. Thyroid Function Tests: No results for input(s): TSH, T4TOTAL, FREET4, T3FREE, THYROIDAB in the last 72 hours. Anemia Panel: No results for input(s): VITAMINB12, FOLATE, FERRITIN, TIBC, IRON, RETICCTPCT in the last 72 hours. Urine analysis:    Component Value Date/Time   COLORURINE YELLOW 01/31/2011 2039   APPEARANCEUR CLEAR 01/31/2011 2039   LABSPEC 1.010 01/31/2011 2039   PHURINE 6.5 01/31/2011 2039   GLUCOSEU NEGATIVE 01/31/2011 2039   HGBUR  NEGATIVE 01/31/2011 2039   HGBUR trace-lysed 09/05/2006 0000   BILIRUBINUR NEGATIVE 01/31/2011 2039   [place] NEGATIVE 01/31/2011 2039   PROTEINUR 30 (  A) 01/31/2011 2039   UROBILINOGEN 0.2 01/31/2011 2039   NITRITE NEGATIVE 01/31/2011 2039   LEUKOCYTESUR NEGATIVE 01/31/2011 2039    Radiological Exams on Admission: Ir US Guide Vasc Access Left  Result Date: 05/07/2016 CLINICAL DATA:  End-stage renal disease. Occluded left upper arm if AV dialysis fistula. EXAM: DIALYSIS FISTULA DECLOT VENOUS ANGIOPLASTY ULTRASOUND GUIDANCE FOR VASCULAR ACCESS FLUOROSCOPY TIME:  5.7 minutes, 63  uGym2 DAP TECHNIQUE: The procedure, risks (including but not limited to bleeding, infection, organ damage ), benefits, and alternatives were explained to the patient. Questions regarding the procedure were encouraged and answered. The patient understands and consents to the procedure. Intravenous Fentanyl and Versed were administered as conscious sedation during continuous monitoring of the patient's level of consciousness and physiological / cardiorespiratory status by the radiology RN, with a total moderate sedation time of 38 minutes. The outflow vein of the fistula just central to the anastomosis was accessed antegrade with 21-gauge micropuncture needle under real-time ultrasonic guidance after the overlying skin prepped with Betadine, draped in usual sterile fashion, infiltrated locally with 1%lidocaine. Needle exchanged over 018guidewire for transitional dilator through which a Bentson wire was advanced to . Over this a 59F sheath was placed, through which a 5 [place] Kumpe catheter was advanced, negotiated through the outflow vein. 2 mg tPA were administered within the thrombosed segment of the outflow vein. Once the catheter advanced centrally outflow venography was performed demonstrating patency of the central venous system through the SVC . 3000 units heparin were administered IV. The Kumpe was exchanged over an Amplatz  guide wire for the AngioJet device, used to mechanically thrombolyse the outflow cephalic vein and remove the thrombus from the native outflow. This was exchanged for a 7 mm x 4 cm Conquest angioplasty balloon, advanced to the outflow vein. Angioplasty of the cephalic arch was performed. Injection showed antegrade flow with partial clearance of thrombus from the graft. No extravasation or apparent complication of angioplasty. Reflux injection showed no significant stenosis at the anastomosis. The opacified native arterial systems unremarkable. Additional passes of the Angiojet catheter cleared some of the residual mural thrombus in the dilated outflow cephalic vein. Follow up shuntogram showed good flow through the outflow vein, no extravasation. She The catheter and sheath was then removed and hemostasis achieved with 2-0 Ethilon suture. Patient tolerated procedure well. IMPRESSION: 1. Technically successful declot of left upper arm hemodialysis fistula. 2. Technically successful balloon angioplasty of central aspect of outflow cephalic vein. ACCESS: Remains approachable for percutaneous intervention as needed. Electronically Signed   By: Lucrezia Europe M.D.   On: 05/06/2016 17:11   Dg Chest Port 1 View  Result Date: 05/07/2016 CLINICAL DATA:  83 year old female with left-sided chest pain. EXAM: PORTABLE CHEST 1 VIEW COMPARISON:  Chest radiograph dated 04/22/2016 and 12/11/2015 FINDINGS: There is eventration of the left hemidiaphragm similar to the prior radiographs. A small left pleural effusion or left lung base infiltrate is not entirely excluded. Clinical correlation is recommended. The right lung is clear. There is no pneumothorax. There is stable cardiac silhouette. Atherosclerotic calcification of the aortic arch. Mild central vascular prominence as seen on the prior radiograph may represent mild vascular congestion. There is degenerative changes of the shoulders. No acute osseous pathology. IMPRESSION: 1.  Persistent elevation of the left hemidiaphragm with associated subsegmental atelectasis of the left lung base. A small left sided pleural effusion or left lung base infiltrate is not entirely excluded. Clinical correlation is recommended. 2. Mild central vascular prominence similar to prior studies  may represent mild vascular congestion. Electronically Signed   By: Anner Crete M.D.   On: 05/07/2016 06:59   Ir Thrombectomy Av Fistula W/thrombolysis/pta Inc/shunt/img Left  Result Date: 05/06/2016 CLINICAL DATA:  End-stage renal disease. Occluded left upper arm if AV dialysis fistula. EXAM: DIALYSIS FISTULA DECLOT VENOUS ANGIOPLASTY ULTRASOUND GUIDANCE FOR VASCULAR ACCESS FLUOROSCOPY TIME:  5.7 minutes, 23  uGym2 DAP TECHNIQUE: The procedure, risks (including but not limited to bleeding, infection, organ damage ), benefits, and alternatives were explained to the patient. Questions regarding the procedure were encouraged and answered. The patient understands and consents to the procedure. Intravenous Fentanyl and Versed were administered as conscious sedation during continuous monitoring of the patient's level of consciousness and physiological / cardiorespiratory status by the radiology RN, with a total moderate sedation time of 38 minutes. The outflow vein of the fistula just central to the anastomosis was accessed antegrade with 21-gauge micropuncture needle under real-time ultrasonic guidance after the overlying skin prepped with Betadine, draped in usual sterile fashion, infiltrated locally with 1%lidocaine. Needle exchanged over 018guidewire for transitional dilator through which a Bentson wire was advanced to . Over this a 6F sheath was placed, through which a 5 [place] Kumpe catheter was advanced, negotiated through the outflow vein. 2 mg tPA were administered within the thrombosed segment of the outflow vein. Once the catheter advanced centrally outflow venography was performed demonstrating patency of  the central venous system through the SVC . 3000 units heparin were administered IV. The Kumpe was exchanged over an Amplatz guide wire for the AngioJet device, used to mechanically thrombolyse the outflow cephalic vein and remove the thrombus from the native outflow. This was exchanged for a 7 mm x 4 cm Conquest angioplasty balloon, advanced to the outflow vein. Angioplasty of the cephalic arch was performed. Injection showed antegrade flow with partial clearance of thrombus from the graft. No extravasation or apparent complication of angioplasty. Reflux injection showed no significant stenosis at the anastomosis. The opacified native arterial systems unremarkable. Additional passes of the Angiojet catheter cleared some of the residual mural thrombus in the dilated outflow cephalic vein. Follow up shuntogram showed good flow through the outflow vein, no extravasation. She The catheter and sheath was then removed and hemostasis achieved with 2-0 Ethilon suture. Patient tolerated procedure well. IMPRESSION: 1. Technically successful declot of left upper arm hemodialysis fistula. 2. Technically successful balloon angioplasty of central aspect of outflow cephalic vein. ACCESS: Remains approachable for percutaneous intervention as needed. Electronically Signed   By: Lucrezia Europe M.D.   On: 05/06/2016 17:11    EKG: Independently reviewed. T wave inversion ead 3.   Assessment/Plan Active Problems:   Diabetes mellitus with renal complications (HCC)   Anemia in chronic kidney disease   CATARACT NOS   Benign hypertensive heart and kidney disease and CKD stage V (HCC)   Chronic respiratory failure (HCC)   ESRD (end stage renal disease) on dialysis (HCC)   Chronic diastolic heart failure (HCC)   Chest pain  1-Chest pain;  Patient presents with chest pain, resolved after nitroglycerin. Troponin mildly elevated. EKG ST inversion lead V 3.  Patient will be transfer to [facility]. Cardiology was consulted.  Cycle  enzymes, resume Imdur, aspirin. nitroglycerin PRN.   2-ESRD; HD M, W, F. She is due for HD today. Nephrologist consulted.  K mildly elevated. Kayexalate ordered.   3-HTN; resume home medications.   4-Hypothyroidism; continue with synthroid.     DVT prophylaxis: heparin  Code Status: full code.  Family Communication: care discussed with patient.  Disposition Plan: home in 24 to 48 hours Consults called:cardiology , nephrology.  Admission status: observation, Telemetry    Niel Hummer A MD [facility] Pager [phone]  If 7PM-7AM, please contact night-coverage [email or web address] Password [facility]  05/07/2016, 9:44 AM

## 2016-05-08 DIAGNOSIS — I1311 Hypertensive heart and chronic kidney disease without heart failure, with stage 5 chronic kidney disease, or end stage renal disease: Secondary | ICD-10-CM

## 2016-05-08 DIAGNOSIS — N185 Chronic kidney disease, stage 5: Secondary | ICD-10-CM

## 2016-05-08 DIAGNOSIS — F0390 Unspecified dementia without behavioral disturbance: Secondary | ICD-10-CM | POA: Diagnosis not present

## 2016-05-08 DIAGNOSIS — E0821 Diabetes mellitus due to underlying condition with diabetic nephropathy: Secondary | ICD-10-CM | POA: Diagnosis not present

## 2016-05-08 DIAGNOSIS — R1310 Dysphagia, unspecified: Secondary | ICD-10-CM | POA: Diagnosis not present

## 2016-05-08 DIAGNOSIS — N184 Chronic kidney disease, stage 4 (severe): Secondary | ICD-10-CM

## 2016-05-08 DIAGNOSIS — I200 Unstable angina: Secondary | ICD-10-CM

## 2016-05-08 DIAGNOSIS — R0789 Other chest pain: Secondary | ICD-10-CM | POA: Diagnosis not present

## 2016-05-08 DIAGNOSIS — D631 Anemia in chronic kidney disease: Secondary | ICD-10-CM

## 2016-05-08 DIAGNOSIS — R079 Chest pain, unspecified: Secondary | ICD-10-CM | POA: Diagnosis not present

## 2016-05-08 LAB — CBC
HEMATOCRIT: 27 % — AB (ref 36.0–46.0)
Hemoglobin: 8.6 g/dL — ABNORMAL LOW (ref 12.0–15.0)
MCH: 25.1 pg — ABNORMAL LOW (ref 26.0–34.0)
MCHC: 31.9 g/dL (ref 30.0–36.0)
MCV: 78.9 fL (ref 78.0–100.0)
Platelets: 121 10*3/uL — ABNORMAL LOW (ref 150–400)
RBC: 3.42 MIL/uL — ABNORMAL LOW (ref 3.87–5.11)
RDW: 15.1 % (ref 11.5–15.5)
WBC: 8.3 10*3/uL (ref 4.0–10.5)

## 2016-05-08 LAB — BASIC METABOLIC PANEL
ANION GAP: 11 (ref 5–15)
BUN: 36 mg/dL — AB (ref 6–20)
CHLORIDE: 97 mmol/L — AB (ref 101–111)
CO2: 28 mmol/L (ref 22–32)
Calcium: 8.5 mg/dL — ABNORMAL LOW (ref 8.9–10.3)
Creatinine, Ser: 6.45 mg/dL — ABNORMAL HIGH (ref 0.44–1.00)
GFR calc Af Amer: 6 mL/min — ABNORMAL LOW (ref >60)
GFR, EST NON AFRICAN AMERICAN: 5 mL/min — AB (ref >60)
GLUCOSE: 181 mg/dL — AB (ref 65–99)
POTASSIUM: 4.1 mmol/L (ref 3.5–5.1)
Sodium: 136 mmol/L (ref 135–145)

## 2016-05-08 LAB — HEPATIC FUNCTION PANEL
ALT: 14 U/L (ref 14–54)
AST: 25 U/L (ref 15–41)
Albumin: 3 g/dL — ABNORMAL LOW (ref 3.5–5.0)
Alkaline Phosphatase: 75 U/L (ref 38–126)
BILIRUBIN DIRECT: 0.1 mg/dL (ref 0.1–0.5)
BILIRUBIN TOTAL: 0.5 mg/dL (ref 0.3–1.2)
Indirect Bilirubin: 0.4 mg/dL (ref 0.3–0.9)
Total Protein: 5.6 g/dL — ABNORMAL LOW (ref 6.5–8.1)

## 2016-05-08 LAB — TROPONIN I: TROPONIN I: 0.16 ng/mL — AB (ref <0.03)

## 2016-05-08 LAB — HEPATITIS B SURFACE ANTIGEN: Hepatitis B Surface Ag: NEGATIVE

## 2016-05-08 LAB — LIPASE, BLOOD: Lipase: 33 U/L (ref 11–51)

## 2016-05-08 MED ORDER — ALBUTEROL SULFATE (2.5 MG/3ML) 0.083% IN NEBU
3.0000 mL | INHALATION_SOLUTION | Freq: Four times a day (QID) | RESPIRATORY_TRACT | Status: DC
  Administered 2016-05-08 – 2016-05-09 (×4): 3 mL via RESPIRATORY_TRACT
  Filled 2016-05-08 (×6): qty 3

## 2016-05-08 MED ORDER — PANTOPRAZOLE SODIUM 40 MG PO TBEC
40.0000 mg | DELAYED_RELEASE_TABLET | Freq: Every day | ORAL | Status: DC
  Administered 2016-05-08 – 2016-05-10 (×3): 40 mg via ORAL
  Filled 2016-05-08 (×3): qty 1

## 2016-05-08 NOTE — Progress Notes (Signed)
Patient ID: [name], female   DOB: 09/10/1925, 83 y.o.   MRN: [id number] S: Main complaint is difficulty swallowing, "I couldn't swallow the eggs" O:BP (!) 138/92 (BP Location: Right Arm)   Pulse 86   Temp 99.5 F (37.5 C) (Oral)   Resp 18   Ht 5\' 4"  (1.626 m)   Wt 85.4 kg (188 lb 3.2 oz)   LMP 12/24/2010   SpO2 96%   BMI 32.30 kg/m   Intake/Output Summary (Last 24 hours) at 05/08/16 0902 Last data filed at 05/08/16 0855  Gross per 24 hour  Intake              240 ml  Output             2000 ml  Net            -1760 ml   Intake/Output: I/O last 3 completed shifts: In: -  Out: 2000 [Other:2000]  Intake/Output this shift:  Total I/O In: 240 [P.O.:240] Out: -  Weight change:  Gen: WD AAF in NAd CVS: no rub Resp:cta with poor inspiratory effort, decreased BS at left base Abd:+BS, soft, NT/ND Ext: no edema, LUE AVF +T/B   Recent Labs Lab 05/06/16 1435 05/07/16 0656 05/07/16 1126 05/08/16 0115  NA 139 139  --  136  K 4.9 5.7*  --  4.1  CL 97* 99*  --  97*  CO2 30 28  --  28  GLUCOSE 106* 108*  --  181*  BUN 78* 91*  --  36*  CREATININE 10.74* 11.59* 12.18* 6.45*  CALCIUM 9.0 9.0  --  8.5*   Liver Function Tests: No results for input(s): AST, ALT, ALKPHOS, BILITOT, PROT, ALBUMIN in the last 168 hours. No results for input(s): LIPASE, AMYLASE in the last 168 hours. No results for input(s): AMMONIA in the last 168 hours. CBC:  Recent Labs Lab 05/06/16 1435 05/07/16 0656 05/08/16 0115  WBC 9.5 12.0* 8.3  NEUTROABS  --  9.9*  --   HGB 9.3* 9.8* 8.6*  HCT 28.6* 29.1* 27.0*  MCV 77.9* 78.9 78.9  PLT 160 132* 121*   Cardiac Enzymes:  Recent Labs Lab 05/07/16 0656 05/07/16 0852 05/07/16 1126 05/07/16 1914 05/08/16 0115  TROPONINI 0.67* 0.67* 0.46* 0.24* 0.16*   CBG: No results for input(s): GLUCAP in the last 168 hours.  Iron Studies: No results for input(s): IRON, TIBC, TRANSFERRIN, FERRITIN in the last 72 hours. Studies/Results: Ir US  Guide Vasc Access Left  Result Date: 05/07/2016 CLINICAL DATA:  End-stage renal disease. Occluded left upper arm if AV dialysis fistula. EXAM: DIALYSIS FISTULA DECLOT VENOUS ANGIOPLASTY ULTRASOUND GUIDANCE FOR VASCULAR ACCESS FLUOROSCOPY TIME:  5.7 minutes, 58  uGym2 DAP TECHNIQUE: The procedure, risks (including but not limited to bleeding, infection, organ damage ), benefits, and alternatives were explained to the patient. Questions regarding the procedure were encouraged and answered. The patient understands and consents to the procedure. Intravenous Fentanyl and Versed were administered as conscious sedation during continuous monitoring of the patient's level of consciousness and physiological / cardiorespiratory status by the radiology RN, with a total moderate sedation time of 38 minutes. The outflow vein of the fistula just central to the anastomosis was accessed antegrade with 21-gauge micropuncture needle under real-time ultrasonic guidance after the overlying skin prepped with Betadine, draped in usual sterile fashion, infiltrated locally with 1%lidocaine. Needle exchanged over 018guidewire for transitional dilator through which a Bentson wire was advanced to . Over this a 75F sheath was  placed, through which a 5 [place] Kumpe catheter was advanced, negotiated through the outflow vein. 2 mg tPA were administered within the thrombosed segment of the outflow vein. Once the catheter advanced centrally outflow venography was performed demonstrating patency of the central venous system through the SVC . 3000 units heparin were administered IV. The Kumpe was exchanged over an Amplatz guide wire for the AngioJet device, used to mechanically thrombolyse the outflow cephalic vein and remove the thrombus from the native outflow. This was exchanged for a 7 mm x 4 cm Conquest angioplasty balloon, advanced to the outflow vein. Angioplasty of the cephalic arch was performed. Injection showed antegrade flow with partial  clearance of thrombus from the graft. No extravasation or apparent complication of angioplasty. Reflux injection showed no significant stenosis at the anastomosis. The opacified native arterial systems unremarkable. Additional passes of the Angiojet catheter cleared some of the residual mural thrombus in the dilated outflow cephalic vein. Follow up shuntogram showed good flow through the outflow vein, no extravasation. She The catheter and sheath was then removed and hemostasis achieved with 2-0 Ethilon suture. Patient tolerated procedure well. IMPRESSION: 1. Technically successful declot of left upper arm hemodialysis fistula. 2. Technically successful balloon angioplasty of central aspect of outflow cephalic vein. ACCESS: Remains approachable for percutaneous intervention as needed. Electronically Signed   By: Lucrezia Europe M.D.   On: 05/06/2016 17:11   Dg Chest Port 1 View  Result Date: 05/07/2016 CLINICAL DATA:  83 year old female with left-sided chest pain. EXAM: PORTABLE CHEST 1 VIEW COMPARISON:  Chest radiograph dated 04/22/2016 and 12/11/2015 FINDINGS: There is eventration of the left hemidiaphragm similar to the prior radiographs. A small left pleural effusion or left lung base infiltrate is not entirely excluded. Clinical correlation is recommended. The right lung is clear. There is no pneumothorax. There is stable cardiac silhouette. Atherosclerotic calcification of the aortic arch. Mild central vascular prominence as seen on the prior radiograph may represent mild vascular congestion. There is degenerative changes of the shoulders. No acute osseous pathology. IMPRESSION: 1. Persistent elevation of the left hemidiaphragm with associated subsegmental atelectasis of the left lung base. A small left sided pleural effusion or left lung base infiltrate is not entirely excluded. Clinical correlation is recommended. 2. Mild central vascular prominence similar to prior studies may represent mild vascular  congestion. Electronically Signed   By: Anner Crete M.D.   On: 05/07/2016 06:59   Ir Thrombectomy Av Fistula W/thrombolysis/pta Inc/shunt/img Left  Result Date: 05/06/2016 CLINICAL DATA:  End-stage renal disease. Occluded left upper arm if AV dialysis fistula. EXAM: DIALYSIS FISTULA DECLOT VENOUS ANGIOPLASTY ULTRASOUND GUIDANCE FOR VASCULAR ACCESS FLUOROSCOPY TIME:  5.7 minutes, 78  uGym2 DAP TECHNIQUE: The procedure, risks (including but not limited to bleeding, infection, organ damage ), benefits, and alternatives were explained to the patient. Questions regarding the procedure were encouraged and answered. The patient understands and consents to the procedure. Intravenous Fentanyl and Versed were administered as conscious sedation during continuous monitoring of the patient's level of consciousness and physiological / cardiorespiratory status by the radiology RN, with a total moderate sedation time of 38 minutes. The outflow vein of the fistula just central to the anastomosis was accessed antegrade with 21-gauge micropuncture needle under real-time ultrasonic guidance after the overlying skin prepped with Betadine, draped in usual sterile fashion, infiltrated locally with 1%lidocaine. Needle exchanged over 018guidewire for transitional dilator through which a Bentson wire was advanced to . Over this a 78F sheath was placed, through which a 5 [place]  Kumpe catheter was advanced, negotiated through the outflow vein. 2 mg tPA were administered within the thrombosed segment of the outflow vein. Once the catheter advanced centrally outflow venography was performed demonstrating patency of the central venous system through the SVC . 3000 units heparin were administered IV. The Kumpe was exchanged over an Amplatz guide wire for the AngioJet device, used to mechanically thrombolyse the outflow cephalic vein and remove the thrombus from the native outflow. This was exchanged for a 7 mm x 4 cm Conquest angioplasty  balloon, advanced to the outflow vein. Angioplasty of the cephalic arch was performed. Injection showed antegrade flow with partial clearance of thrombus from the graft. No extravasation or apparent complication of angioplasty. Reflux injection showed no significant stenosis at the anastomosis. The opacified native arterial systems unremarkable. Additional passes of the Angiojet catheter cleared some of the residual mural thrombus in the dilated outflow cephalic vein. Follow up shuntogram showed good flow through the outflow vein, no extravasation. She The catheter and sheath was then removed and hemostasis achieved with 2-0 Ethilon suture. Patient tolerated procedure well. IMPRESSION: 1. Technically successful declot of left upper arm hemodialysis fistula. 2. Technically successful balloon angioplasty of central aspect of outflow cephalic vein. ACCESS: Remains approachable for percutaneous intervention as needed. Electronically Signed   By: Lucrezia Europe M.D.   On: 05/06/2016 17:11   . albuterol  3 mL Inhalation QID  . amLODipine  10 mg Oral q morning - 10a  . aspirin EC  81 mg Oral Daily  . cinacalcet  30 mg Oral Q breakfast  . citalopram  20 mg Oral Daily  . darbepoetin (ARANESP) injection - DIALYSIS  60 mcg Intravenous Q Fri-HD  . docusate sodium  100 mg Oral BID  . [START ON Jan 23, 202018] doxercalciferol  4 mcg Intravenous Q M,W,F-HD  . heparin  5,000 Units Subcutaneous Q8H  . isosorbide mononitrate  60 mg Oral Daily  . latanoprost  1 drop Both Eyes QHS  . levothyroxine  25 mcg Oral QAC breakfast  . mouth rinse  15 mL Mouth Rinse BID  . mirtazapine  7.5 mg Oral QHS  . pantoprazole  40 mg Oral Daily  . polyethylene glycol  17 g Oral Daily  . sevelamer carbonate  2,400 mg Oral TID WC  . sodium chloride flush  3 mL Intravenous Q12H  . sodium polystyrene  15 g Oral Once    BMET    Component Value Date/Time   NA 136 05/08/2016 0115   K 4.1 05/08/2016 0115   CL 97 (L) 05/08/2016 0115   CO2 28  05/08/2016 0115   GLUCOSE 181 (H) 05/08/2016 0115   BUN 36 (H) 05/08/2016 0115   CREATININE 6.45 (H) 05/08/2016 0115   CREATININE 4.46 (H) 03/29/2013 1006   CALCIUM 8.5 (L) 05/08/2016 0115   CALCIUM 7.2 (L) 02/01/2011 1750   GFRNONAA 5 (L) 05/08/2016 0115   GFRNONAA 8 (L) 03/29/2013 1006   GFRAA 6 (L) 05/08/2016 0115   GFRAA 10 (L) 03/29/2013 1006   CBC    Component Value Date/Time   WBC 8.3 05/08/2016 0115   RBC 3.42 (L) 05/08/2016 0115   HGB 8.6 (L) 05/08/2016 0115   HCT 27.0 (L) 05/08/2016 0115   PLT 121 (L) 05/08/2016 0115   MCV 78.9 05/08/2016 0115   MCH 25.1 (L) 05/08/2016 0115   MCHC 31.9 05/08/2016 0115   RDW 15.1 05/08/2016 0115   LYMPHSABS 1.1 05/07/2016 0656   MONOABS 1.0 05/07/2016 0656   EOSABS  0.0 05/07/2016 0656   BASOSABS 0.0 05/07/2016 0656     Dialysis Orders: [facility]: [facility]  on MWF . EDW 85kg HD Bath 2K/2.5Ca  Time 210 minutes Heparin 2000 bolus 500/hr. Access LUE AVG (s/p declot 05/06/16)  BFR 350 DFR 600    Hectoral 4 mcg IV/HD Epogen 2600 Units IV/HD    Assessment/Plan: 1.  Chest Pain - resolved with NTG, elevated troponin and ST inversion of lead V3.  Cards consult  2. Dysphagia-  may be source of chest pain.  Would recommend further evaluation with barium swallow or GI eval with EGD 3. ESRD -  s/p HD 05/07/16 with UF of 2liters to get to edw.  She missed HD on Wednesday due to clotted avg s/p declot 05/06/16.   4.  Hypertension/volume  - discussed another session of HD with additional UF, however she does not think she has extra fluid on and her main difficulty is related to dysphagia.  Will wait for cardiology and if they feel she is still volume overloaded and she agrees would plan for a short HD session with addition UF and adjust outpatient edw. 5.  Anemia  - continue with procrit 2600 IV with HD 6.  Metabolic bone disease -   Continue outpatient meds 7.  Nutrition - renal diet 8. Vascular access- s/p declot.  Swollen arm and had some  difficulty with cannulation.    Donetta Potts, MD [employer] [phone]

## 2016-05-08 NOTE — Evaluation (Addendum)
Clinical/Bedside Swallow Evaluation Patient Details  Name: [name] MRN: [id number] Date of Birth: 1925/12/27  Today's Date: 05/08/2016 Time: SLP Start Time (ACUTE ONLY): 0855 SLP Stop Time (ACUTE ONLY): 0925 SLP Time Calculation (min) (ACUTE ONLY): 30 min  Past Medical History:  Past Medical History:  Diagnosis Date  . Anemia   . Anemia in CKD (chronic kidney disease)   . Anxiety   . Arthritis    gout, right shoulder  . Cancer [facility])    ?stomach cancer   . Cataracts, both eyes   . CHF (congestive heart failure) ([place])   . CKD (chronic kidney disease), stage V ([place])    on HD on M/W/F  . Depression   . Diabetes mellitus, type 2 ([place])   . GERD (gastroesophageal reflux disease)   . Gynecologic cancer   . Hyperlipidemia   . Hypertension   . IBS (irritable bowel syndrome)    with costipation  . Intraoperative hemorrhage and hematoma of a digestive system organ or structure complicating a digestive system procedure   . Normal cardiac stress test 05/2015  . Overactive bladder   . Pneumonia    2 times in 2016  . SOB (shortness of breath)    nml spirometry 09/05/06  . Stroke [facility])    residual right side weakness and pain  . SVT (supraventricular tachycardia) ([place])   . Syncope and collapse   . Thyroid disease    Past Surgical History:  Past Surgical History:  Procedure Laterality Date  . AV FISTULA PLACEMENT, BRACHIOCEPHALIC  40/98/1191   left arm   by Dr. Bridgett Larsson  . COLONOSCOPY    . FISTULA SUPERFICIALIZATION Left 06/10/2015   Procedure: LEFT UPPER ARM BRACHIOCEPHALIC FISTULA PSEUDOANEURYSM PLICATION;  Surgeon: Conrad Climax, MD;  Location: [facility];  Service: Vascular;  Laterality: Left;  . IR THROMBECTOMY AV FISTULA W/THROMBOLYSIS/PTA INC/SHUNT/IMG LEFT Left 05/06/2016  . IR US GUIDE VASC ACCESS LEFT  05/06/2016  . PERIPHERAL VASCULAR CATHETERIZATION Left 05/15/2015   Procedure: Fistulagram;  Surgeon: Conrad [place], MD;  Location: [facility];  Service: Cardiovascular;   Laterality: Left;  . TEE WITHOUT CARDIOVERSION  2011   2/2 Strep Bovis infection  . VESICOVAGINAL FISTULA CLOSURE W/ TAH     HPI:  Patient is an 83 y.o female with PMH: ESRD on hemodialysis, recent declot left arm fistula, HTN, gout, who is a resident at "[facility], in [place]" ([place]) who presented to hospital with c/o chest pain, SOB on exertion as well as difficulty swallowing.    Assessment / Plan / Recommendation Clinical Impression  Patient presents with a mild oropharyngeal dysphagia characterized by prolonged mastication, bolus formation and delayed oral transit with solid texture boluses, although this appears to be mild in severity. Patient did not exhibit any overt s/s of aspiration or penetration, or difficulty and did not c/o difficulty with thin liquids via cup and straw. RN gave patient medications, and patient swallowed all pills at once, with sips of water with no overt difficulties and no reported difficulties. Patient informed clinician that at the ALF where she stays, the cook prepares her eggs "soft" as well as other foods and that she seems to be able to swallow better when consistency of foods in a little softer, tender. Patient stated that she has a sensation of food "getting stuck" at level of chest, but that drinking water helps it to go down. Based on patient's report, suspect that there is an esophageal component to her dysphagia.  SLP Visit Diagnosis: Dysphagia, oropharyngeal phase (R13.12)    Aspiration Risk  Mild aspiration risk    Diet Recommendation Dysphagia 3 (Mech soft);Thin liquid   Liquid Administration via: Cup;Straw Medication Administration: Whole meds with liquid Supervision: Patient able to self feed Compensations: Minimize environmental distractions;Slow rate;Small sips/bites Postural Changes: Seated upright at 90 degrees    Other  Recommendations Oral Care Recommendations: Oral care BID   Follow up Recommendations Other (comment) (if swallowing  difficulties persist, may benefit from SLP eval at her ALF) Recommend maintaining as close to a Dysphagia 3 (mech soft) consistency at her ALF as is possible.      Frequency and Duration min 1 x/week  1 week       Prognosis Prognosis for Safe Diet Advancement: Good      Swallow Study   General Date of Onset: 05/07/16 HPI: Patient is an 83 y.o female with PMH: ESRD on hemodialysis, recent declot left arm fistula, HTN, gout, who is a resident at [facility], in [place]" ([place]) who presented to hospital with c/o chest pain, SOB on exertion as well as difficulty swallowing.  Type of Study: Bedside Swallow Evaluation Previous Swallow Assessment: N/A Diet Prior to this Study: Regular;Thin liquids Temperature Spikes Noted: No Respiratory Status: Nasal cannula History of Recent Intubation: No Behavior/Cognition: Alert;Cooperative;Pleasant mood Oral Cavity Assessment: Within Functional Limits Oral Care Completed by SLP: No Oral Cavity - Dentition: Adequate natural dentition;Missing dentition Vision: Functional for self-feeding Self-Feeding Abilities: Able to feed self Patient Positioning: Upright in bed Baseline Vocal Quality: Normal Volitional Cough: Strong Volitional Swallow: Able to elicit    Oral/Motor/Sensory Function Overall Oral Motor/Sensory Function: Within functional limits   Ice Chips     Thin Liquid Thin Liquid: Within functional limits Presentation: Self Fed;Straw;Cup Other Comments: No overt s/s of aspiration or penetration with cup and straw sips of water.    Nectar Thick     Honey Thick     Puree Puree: Not tested   Solid   GO   Solid: Impaired Presentation: Self Fed Oral Phase Functional Implications: Prolonged oral transit Other Comments: Patient did not exhibit any overt s/s of aspiration, penetration or difficulty, did not report any difficulty, but did exhibit prolonged mastication, bolus formation and delayed oral transit.    Functional Assessment  Tool Used: Bedside swallow evaluation, clinician judgement  Functional Limitations: Swallowing Swallow Current Status (W2585): At least 20 percent but less than 40 percent impaired, limited or restricted Swallow Goal Status [phone]): At least 1 percent but less than 20 percent impaired, limited or restricted     Sonia Baller, MA, CCC-SLP 05/08/16 11:39 AM

## 2016-05-08 NOTE — Progress Notes (Signed)
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 female who is being seen today for the evaluation of chest pain at the request of Dr. Tyrell Antonio. The patient has a past medical history significant for ESRD on hemodialysis, M-W-F, recent declot of left arm fistula as an outpatient on 0/62, chronic diastolic heart failure, diabetes, COPD on home oxygen,  hypertension, hyperlipidemia, stroke with right-sided weakness, SVT and gout. No history of MI or previous cardiac catheterizations. She presented to an event ED with chest pain, Troponins 0.67, 0.67, 0.46. Transferred to [place] for cardiology evaluation  Assessment and plan 1-Chest pain; atypical most likely abdominal in origin Patient presents with chest pain, resolved after nitroglycerin. Troponins 0.67, 0.67, 0.46. Mildly elevated with no rise.  -EKG without acute changes- T wave changes previously present -Chest pain suspected to be abdominal in nature or possible MSK or related to difficulty swallowing and feeling that her food is getting stuck.  Cardiology has seen the patient and they do not recommend any further cardiac workup negative Lexiscan Myoview last year Barium esophagogram to assess for difficulty swallowing    2-ESRD; HD M, W, F. She is due for HD Monday. Nephrologist following K mildly elevated. Kayexalate ordered.   3-HTN; resume home medications.   4-Hypothyroidism; continue with synthroid.   5. Abdominal discomfort-check lipase, LFTs, barium esophagram. Discontinue Pepcid  and start PPI   6. Diabetes type 2 -No longer on medications. Last A1c 5.9 on 10/02/2015  DVT prophylaxsis heparin  Code Status:  Full code    Family Communication: Discussed in detail with the patient, all imaging results, lab results explained to the patient   Disposition Plan:  1-2 days     Consultants:  Nephrology  Cardiology  Procedures:  None  Antibiotics: Anti-infectives    None         HPI/Subjective: " no chest pain, had some flank pain which is improved "  Objective: Vitals:   05/07/16 1736 05/07/16 2025 05/07/16 2059 05/08/16 0423  BP: (!) 148/63  (!) 149/45 (!) 138/92  Pulse: 78  77 86  Resp: 19  18 18   Temp: 97.9 F (36.6 C)  99.6 F (37.6 C) 99.5 F (37.5 C)  TempSrc: Oral  Oral Oral  SpO2: 98% 98% 98% 96%  Weight:    85.4 kg (188 lb 3.2 oz)  Height:        Intake/Output Summary (Last 24 hours) at 05/08/16 6948 Last data filed at 05/07/16 1736  Gross per 24 hour  Intake                0 ml  Output             2000 ml  Net            -2000 ml    Exam:  Examination:  General exam: Appears calm and comfortable  Respiratory system: Clear to auscultation. Respiratory effort normal. Cardiovascular system: S1 & S2 heard, RRR. No JVD, murmurs, rubs, gallops or clicks.  No pedal edema. Gastrointestinal system: Abdomen is nondistended, soft and nontender. No organomegaly or masses felt. Normal bowel sounds heard. Central nervous system: Alert and oriented. No focal neurological deficits. Extremities: Symmetric 5 x 5 power. Skin: No rashes, lesions or ulcers Psychiatry: Judgement and insight appear normal. Mood & affect appropriate.     Data Reviewed: I have personally reviewed following labs and imaging studies  Micro Results Recent Results (from the past 240 hour(s))  MRSA PCR Screening     Status: None   Collection Time: 05/07/16  5:53 PM  Result Value Ref Range Status   MRSA by PCR NEGATIVE NEGATIVE Final    Comment:         The GeneXpert MRSA Assay (FDA approved for NASAL specimens only), is one component of a comprehensive MRSA colonization surveillance program. It is not intended to diagnose MRSA infection nor to guide or monitor treatment for MRSA infections.     Radiology Reports Ir US Guide Vasc Access Left  Result Date: 05/07/2016 CLINICAL DATA:  End-stage renal disease. Occluded left upper arm if AV dialysis fistula. EXAM: DIALYSIS FISTULA DECLOT VENOUS ANGIOPLASTY ULTRASOUND GUIDANCE FOR VASCULAR ACCESS FLUOROSCOPY TIME:  5.7 minutes, 74  uGym2 DAP TECHNIQUE: The procedure, risks (including but not limited to bleeding, infection, organ damage ), benefits, and alternatives were explained to the patient. Questions regarding the procedure were encouraged and answered. The patient understands and consents to the procedure. Intravenous Fentanyl and Versed were administered as conscious sedation during continuous monitoring of the patient's level of consciousness and physiological / cardiorespiratory status by the radiology RN, with a total moderate sedation time of 38 minutes. The outflow vein of the fistula just central to the anastomosis was accessed antegrade with 21-gauge micropuncture needle under real-time ultrasonic guidance after the overlying skin prepped with Betadine, draped in usual sterile fashion, infiltrated locally with 1%lidocaine. Needle exchanged over 018guidewire for transitional dilator through which a Bentson wire was advanced to . Over this a 38F sheath was placed, through which a 5 [place] Kumpe catheter was advanced, negotiated through the outflow vein. 2 mg tPA were administered within the thrombosed segment of the outflow vein. Once the catheter advanced centrally outflow venography was performed demonstrating patency of the central venous system through the SVC . 3000 units heparin were administered IV. The Kumpe was exchanged over an Amplatz guide wire for the AngioJet device, used to  mechanically thrombolyse the outflow cephalic vein and remove the thrombus from the native outflow. This was exchanged for a 7 mm x 4 cm Conquest angioplasty balloon, advanced to the outflow vein. Angioplasty of the cephalic arch was performed. Injection showed antegrade flow with partial clearance of thrombus from the graft. No extravasation or apparent complication of angioplasty. Reflux injection showed no significant stenosis at the anastomosis. The opacified native arterial systems unremarkable. Additional passes of the Angiojet catheter cleared some of the residual mural thrombus in the dilated outflow cephalic vein. Follow up shuntogram showed good flow through the outflow vein, no extravasation. She The catheter and sheath was then removed and hemostasis achieved with 2-0 Ethilon suture. Patient tolerated procedure well. IMPRESSION: 1. Technically successful declot of left upper arm hemodialysis fistula. 2. Technically successful balloon angioplasty of central aspect of outflow cephalic vein. ACCESS: Remains approachable for percutaneous intervention as needed. Electronically Signed   By: Lucrezia Europe M.D.   On: 05/06/2016 17:11   Dg Chest Port 1 View  Result Date: 05/07/2016 CLINICAL DATA:  83 year old female with left-sided chest pain.  EXAM: PORTABLE CHEST 1 VIEW COMPARISON:  Chest radiograph dated 04/22/2016 and 12/11/2015 FINDINGS: There is eventration of the left hemidiaphragm similar to the prior radiographs. A small left pleural effusion or left lung base infiltrate is not entirely excluded. Clinical correlation is recommended. The right lung is clear. There is no pneumothorax. There is stable cardiac silhouette. Atherosclerotic calcification of the aortic arch. Mild central vascular prominence as seen on the prior radiograph may represent mild vascular congestion. There is degenerative changes of the shoulders. No acute osseous pathology. IMPRESSION: 1. Persistent elevation of the left  hemidiaphragm with associated subsegmental atelectasis of the left lung base. A small left sided pleural effusion or left lung base infiltrate is not entirely excluded. Clinical correlation is recommended. 2. Mild central vascular prominence similar to prior studies may represent mild vascular congestion. Electronically Signed   By: Anner Crete M.D.   On: 05/07/2016 06:59   Dg Chest Portable 1 View  Result Date: 04/22/2016 CLINICAL DATA:  Shortness of breath since yesterday. EXAM: PORTABLE CHEST 1 VIEW COMPARISON:  12/11/2015 FINDINGS: Chronic cardiomegaly. Chronic aortic atherosclerosis. Poor inspiration. Possible pulmonary venous hypertension without frank edema. No visible effusion. IMPRESSION: Chronic cardiomegaly and aortic atherosclerosis. Possible pulmonary venous hypertension. Electronically Signed   By: Nelson Chimes M.D.   On: 04/22/2016 12:27   Ir Thrombectomy Av Fistula W/thrombolysis/pta Inc/shunt/img Left  Result Date: 05/06/2016 CLINICAL DATA:  End-stage renal disease. Occluded left upper arm if AV dialysis fistula. EXAM: DIALYSIS FISTULA DECLOT VENOUS ANGIOPLASTY ULTRASOUND GUIDANCE FOR VASCULAR ACCESS FLUOROSCOPY TIME:  5.7 minutes, 57  uGym2 DAP TECHNIQUE: The procedure, risks (including but not limited to bleeding, infection, organ damage ), benefits, and alternatives were explained to the patient. Questions regarding the procedure were encouraged and answered. The patient understands and consents to the procedure. Intravenous Fentanyl and Versed were administered as conscious sedation during continuous monitoring of the patient's level of consciousness and physiological / cardiorespiratory status by the radiology RN, with a total moderate sedation time of 38 minutes. The outflow vein of the fistula just central to the anastomosis was accessed antegrade with 21-gauge micropuncture needle under real-time ultrasonic guidance after the overlying skin prepped with Betadine, draped in usual  sterile fashion, infiltrated locally with 1%lidocaine. Needle exchanged over 018guidewire for transitional dilator through which a Bentson wire was advanced to . Over this a 62F sheath was placed, through which a 5 [place] Kumpe catheter was advanced, negotiated through the outflow vein. 2 mg tPA were administered within the thrombosed segment of the outflow vein. Once the catheter advanced centrally outflow venography was performed demonstrating patency of the central venous system through the SVC . 3000 units heparin were administered IV. The Kumpe was exchanged over an Amplatz guide wire for the AngioJet device, used to mechanically thrombolyse the outflow cephalic vein and remove the thrombus from the native outflow. This was exchanged for a 7 mm x 4 cm Conquest angioplasty balloon, advanced to the outflow vein. Angioplasty of the cephalic arch was performed. Injection showed antegrade flow with partial clearance of thrombus from the graft. No extravasation or apparent complication of angioplasty. Reflux injection showed no significant stenosis at the anastomosis. The opacified native arterial systems unremarkable. Additional passes of the Angiojet catheter cleared some of the residual mural thrombus in the dilated outflow cephalic vein. Follow up shuntogram showed good flow through the outflow vein, no extravasation. She The catheter and sheath was then removed and hemostasis achieved with 2-0 Ethilon suture. Patient tolerated procedure well. IMPRESSION: 1.  Technically successful declot of left upper arm hemodialysis fistula. 2. Technically successful balloon angioplasty of central aspect of outflow cephalic vein. ACCESS: Remains approachable for percutaneous intervention as needed. Electronically Signed   By: Lucrezia Europe M.D.   On: 05/06/2016 17:11     CBC  Recent Labs Lab 05/06/16 1435 05/07/16 0656 05/08/16 0115  WBC 9.5 12.0* 8.3  HGB 9.3* 9.8* 8.6*  HCT 28.6* 29.1* 27.0*  PLT 160 132* 121*  MCV  77.9* 78.9 78.9  MCH 25.3* 26.6 25.1*  MCHC 32.5 33.7 31.9  RDW 15.0 15.2 15.1  LYMPHSABS  --  1.1  --   MONOABS  --  1.0  --   EOSABS  --  0.0  --   BASOSABS  --  0.0  --     Chemistries   Recent Labs Lab 05/06/16 1435 05/07/16 0656 05/07/16 1126 05/08/16 0115  NA 139 139  --  136  K 4.9 5.7*  --  4.1  CL 97* 99*  --  97*  CO2 30 28  --  28  GLUCOSE 106* 108*  --  181*  BUN 78* 91*  --  36*  CREATININE 10.74* 11.59* 12.18* 6.45*  CALCIUM 9.0 9.0  --  8.5*   ------------------------------------------------------------------------------------------------------------------ estimated creatinine clearance is 6.4 mL/min (A) (by C-G formula based on SCr of 6.45 mg/dL (H)). ------------------------------------------------------------------------------------------------------------------ No results for input(s): HGBA1C in the last 72 hours. ------------------------------------------------------------------------------------------------------------------ No results for input(s): CHOL, HDL, LDLCALC, TRIG, CHOLHDL, LDLDIRECT in the last 72 hours. ------------------------------------------------------------------------------------------------------------------ No results for input(s): TSH, T4TOTAL, T3FREE, THYROIDAB in the last 72 hours.  Invalid input(s): FREET3 ------------------------------------------------------------------------------------------------------------------ No results for input(s): VITAMINB12, FOLATE, FERRITIN, TIBC, IRON, RETICCTPCT in the last 72 hours.  Coagulation profile  Recent Labs Lab 05/06/16 1435  INR 1.08    No results for input(s): DDIMER in the last 72 hours.  Cardiac Enzymes  Recent Labs Lab 05/07/16 1126 05/07/16 1914 05/08/16 0115  TROPONINI 0.46* 0.24* 0.16*   ------------------------------------------------------------------------------------------------------------------ Invalid input(s): POCBNP   CBG: No results for input(s):  GLUCAP in the last 168 hours.     Studies: Ir US Guide Vasc Access Left  Result Date: 05/07/2016 CLINICAL DATA:  End-stage renal disease. Occluded left upper arm if AV dialysis fistula. EXAM: DIALYSIS FISTULA DECLOT VENOUS ANGIOPLASTY ULTRASOUND GUIDANCE FOR VASCULAR ACCESS FLUOROSCOPY TIME:  5.7 minutes, 2  uGym2 DAP TECHNIQUE: The procedure, risks (including but not limited to bleeding, infection, organ damage ), benefits, and alternatives were explained to the patient. Questions regarding the procedure were encouraged and answered. The patient understands and consents to the procedure. Intravenous Fentanyl and Versed were administered as conscious sedation during continuous monitoring of the patient's level of consciousness and physiological / cardiorespiratory status by the radiology RN, with a total moderate sedation time of 38 minutes. The outflow vein of the fistula just central to the anastomosis was accessed antegrade with 21-gauge micropuncture needle under real-time ultrasonic guidance after the overlying skin prepped with Betadine, draped in usual sterile fashion, infiltrated locally with 1%lidocaine. Needle exchanged over 018guidewire for transitional dilator through which a Bentson wire was advanced to . Over this a 29F sheath was placed, through which a 5 [place] Kumpe catheter was advanced, negotiated through the outflow vein. 2 mg tPA were administered within the thrombosed segment of the outflow vein. Once the catheter advanced centrally outflow venography was performed demonstrating patency of the central venous system through the SVC . 3000 units heparin were administered IV. The Kumpe was exchanged over  an Amplatz guide wire for the AngioJet device, used to mechanically thrombolyse the outflow cephalic vein and remove the thrombus from the native outflow. This was exchanged for a 7 mm x 4 cm Conquest angioplasty balloon, advanced to the outflow vein. Angioplasty of the cephalic arch was  performed. Injection showed antegrade flow with partial clearance of thrombus from the graft. No extravasation or apparent complication of angioplasty. Reflux injection showed no significant stenosis at the anastomosis. The opacified native arterial systems unremarkable. Additional passes of the Angiojet catheter cleared some of the residual mural thrombus in the dilated outflow cephalic vein. Follow up shuntogram showed good flow through the outflow vein, no extravasation. She The catheter and sheath was then removed and hemostasis achieved with 2-0 Ethilon suture. Patient tolerated procedure well. IMPRESSION: 1. Technically successful declot of left upper arm hemodialysis fistula. 2. Technically successful balloon angioplasty of central aspect of outflow cephalic vein. ACCESS: Remains approachable for percutaneous intervention as needed. Electronically Signed   By: Lucrezia Europe M.D.   On: 05/06/2016 17:11   Dg Chest Port 1 View  Result Date: 05/07/2016 CLINICAL DATA:  83 year old female with left-sided chest pain. EXAM: PORTABLE CHEST 1 VIEW COMPARISON:  Chest radiograph dated 04/22/2016 and 12/11/2015 FINDINGS: There is eventration of the left hemidiaphragm similar to the prior radiographs. A small left pleural effusion or left lung base infiltrate is not entirely excluded. Clinical correlation is recommended. The right lung is clear. There is no pneumothorax. There is stable cardiac silhouette. Atherosclerotic calcification of the aortic arch. Mild central vascular prominence as seen on the prior radiograph may represent mild vascular congestion. There is degenerative changes of the shoulders. No acute osseous pathology. IMPRESSION: 1. Persistent elevation of the left hemidiaphragm with associated subsegmental atelectasis of the left lung base. A small left sided pleural effusion or left lung base infiltrate is not entirely excluded. Clinical correlation is recommended. 2. Mild central vascular prominence  similar to prior studies may represent mild vascular congestion. Electronically Signed   By: Anner Crete M.D.   On: 05/07/2016 06:59   Ir Thrombectomy Av Fistula W/thrombolysis/pta Inc/shunt/img Left  Result Date: 05/06/2016 CLINICAL DATA:  End-stage renal disease. Occluded left upper arm if AV dialysis fistula. EXAM: DIALYSIS FISTULA DECLOT VENOUS ANGIOPLASTY ULTRASOUND GUIDANCE FOR VASCULAR ACCESS FLUOROSCOPY TIME:  5.7 minutes, 18  uGym2 DAP TECHNIQUE: The procedure, risks (including but not limited to bleeding, infection, organ damage ), benefits, and alternatives were explained to the patient. Questions regarding the procedure were encouraged and answered. The patient understands and consents to the procedure. Intravenous Fentanyl and Versed were administered as conscious sedation during continuous monitoring of the patient's level of consciousness and physiological / cardiorespiratory status by the radiology RN, with a total moderate sedation time of 38 minutes. The outflow vein of the fistula just central to the anastomosis was accessed antegrade with 21-gauge micropuncture needle under real-time ultrasonic guidance after the overlying skin prepped with Betadine, draped in usual sterile fashion, infiltrated locally with 1%lidocaine. Needle exchanged over 018guidewire for transitional dilator through which a Bentson wire was advanced to . Over this a 77F sheath was placed, through which a 5 [place] Kumpe catheter was advanced, negotiated through the outflow vein. 2 mg tPA were administered within the thrombosed segment of the outflow vein. Once the catheter advanced centrally outflow venography was performed demonstrating patency of the central venous system through the SVC . 3000 units heparin were administered IV. The Kumpe was exchanged over an Amplatz guide wire for the  AngioJet device, used to mechanically thrombolyse the outflow cephalic vein and remove the thrombus from the native outflow. This  was exchanged for a 7 mm x 4 cm Conquest angioplasty balloon, advanced to the outflow vein. Angioplasty of the cephalic arch was performed. Injection showed antegrade flow with partial clearance of thrombus from the graft. No extravasation or apparent complication of angioplasty. Reflux injection showed no significant stenosis at the anastomosis. The opacified native arterial systems unremarkable. Additional passes of the Angiojet catheter cleared some of the residual mural thrombus in the dilated outflow cephalic vein. Follow up shuntogram showed good flow through the outflow vein, no extravasation. She The catheter and sheath was then removed and hemostasis achieved with 2-0 Ethilon suture. Patient tolerated procedure well. IMPRESSION: 1. Technically successful declot of left upper arm hemodialysis fistula. 2. Technically successful balloon angioplasty of central aspect of outflow cephalic vein. ACCESS: Remains approachable for percutaneous intervention as needed. Electronically Signed   By: Lucrezia Europe M.D.   On: 05/06/2016 17:11      Lab Results  Component Value Date   HGBA1C 5.9 (H) 10/02/2015   HGBA1C 5.5 04/22/2015   HGBA1C 5.7 (H) 10/15/2014   Lab Results  Component Value Date   LDLCALC 67 02/12/2014   CREATININE 6.45 (H) 05/08/2016       Scheduled Meds: . albuterol  3 mL Inhalation QID  . amLODipine  10 mg Oral q morning - 10a  . aspirin EC  81 mg Oral Daily  . cinacalcet  30 mg Oral Q breakfast  . citalopram  20 mg Oral Daily  . darbepoetin (ARANESP) injection - DIALYSIS  60 mcg Intravenous Q Fri-HD  . docusate sodium  100 mg Oral BID  . [START ON 2020/10/2916] doxercalciferol  4 mcg Intravenous Q M,W,F-HD  . famotidine  20 mg Oral Daily  . heparin  5,000 Units Subcutaneous Q8H  . isosorbide mononitrate  60 mg Oral Daily  . latanoprost  1 drop Both Eyes QHS  . levothyroxine  25 mcg Oral QAC breakfast  . mouth rinse  15 mL Mouth Rinse BID  . mirtazapine  7.5 mg Oral QHS  .  polyethylene glycol  17 g Oral Daily  . sevelamer carbonate  2,400 mg Oral TID WC  . sodium chloride flush  3 mL Intravenous Q12H  . sodium polystyrene  15 g Oral Once   Continuous Infusions: . sodium chloride       LOS: 0 days    Time spent: >30 MINS    Reyne Dumas  [facility] Pager [phone]. If 7PM-7AM, please contact night-coverage at [email or web address], password [facility] 05/08/2016, 8:33 AM  LOS: 0 days

## 2016-05-09 DIAGNOSIS — E0821 Diabetes mellitus due to underlying condition with diabetic nephropathy: Secondary | ICD-10-CM | POA: Diagnosis not present

## 2016-05-09 DIAGNOSIS — R1310 Dysphagia, unspecified: Secondary | ICD-10-CM

## 2016-05-09 DIAGNOSIS — I1311 Hypertensive heart and chronic kidney disease without heart failure, with stage 5 chronic kidney disease, or end stage renal disease: Secondary | ICD-10-CM | POA: Diagnosis not present

## 2016-05-09 DIAGNOSIS — R079 Chest pain, unspecified: Secondary | ICD-10-CM | POA: Diagnosis not present

## 2016-05-09 DIAGNOSIS — D631 Anemia in chronic kidney disease: Secondary | ICD-10-CM | POA: Diagnosis not present

## 2016-05-09 DIAGNOSIS — I200 Unstable angina: Secondary | ICD-10-CM | POA: Diagnosis not present

## 2016-05-09 DIAGNOSIS — R0789 Other chest pain: Secondary | ICD-10-CM | POA: Diagnosis not present

## 2016-05-09 DIAGNOSIS — N184 Chronic kidney disease, stage 4 (severe): Secondary | ICD-10-CM | POA: Diagnosis not present

## 2016-05-09 DIAGNOSIS — N185 Chronic kidney disease, stage 5: Secondary | ICD-10-CM | POA: Diagnosis not present

## 2016-05-09 MED ORDER — ALBUTEROL SULFATE (2.5 MG/3ML) 0.083% IN NEBU
3.0000 mL | INHALATION_SOLUTION | Freq: Three times a day (TID) | RESPIRATORY_TRACT | Status: DC
  Filled 2016-05-09: qty 3

## 2016-05-09 NOTE — Progress Notes (Signed)
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 female who is being seen today for the evaluation of chest pain at the request of Dr. Tyrell Antonio. The patient has a past medical history significant for ESRD on hemodialysis, M-W-F, recent declot of left arm fistula as an outpatient on 1/24, chronic diastolic heart failure, diabetes, COPD on home oxygen,  hypertension, hyperlipidemia, stroke with right-sided weakness, SVT and gout. No history of MI or previous cardiac catheterizations. She presented to an event ED with chest pain, Troponins 0.67, 0.67, 0.46. Transferred to [place] for cardiology evaluation  Assessment and plan 1-Chest pain; atypical most likely abdominal in origin Patient presents with chest pain, resolved after nitroglycerin. Troponins 0.67, 0.67, 0.46. Mildly elevated with no rise.  -EKG without acute changes- T wave changes previously present -Chest pain suspected to be abdominal in nature or possible MSK or related to difficulty swallowing and feeling that her food is getting stuck.  Cardiology has seen the patient and they do not recommend any further cardiac workup negative Lexiscan Myoview last year Barium esophagogram to assess for difficulty swallowing ,previous need for esophageal stretching in 2012 by Dr Laural Golden , to be performed.4/ 30 Continue PPI Dysphagia 3 (Mech soft);Thin liquid   2-ESRD; HD M, W, F. She is due for HD Monday. Nephrologist following K mildly elevated. Received Kayexalate She missed HD on  Wednesday due to clotted avg s/p declot 05/06/16.   3-HTN; resume home medications.   4-Hypothyroidism; continue with synthroid.   5. Abdominal discomfort-check lipase, LFTs, barium esophagram. Discontinue Pepcid and start PPI   6. Diabetes type 2 -No longer on medications. Last A1c 5.9 on 10/02/2015   DVT prophylaxsis heparin  Code Status:  Full code    Family Communication: Discussed in detail with the patient, all imaging results, lab results explained to the patient   Disposition Plan:  1-2 days,     Consultants:  Nephrology  Cardiology  Procedures:  None  Antibiotics: Anti-infectives    None         HPI/Subjective: Had one choking spell today  Objective: Vitals:   05/08/16 1915 05/08/16 2030 05/09/16 0500 05/09/16 0736  BP:  (!) 116/56 (!) 160/56   Pulse:  70 64   Resp:  18 16   Temp:  99.3 F (37.4 C) 98.7 F (37.1 C)   TempSrc:  Oral Oral   SpO2: 99% 100% 100% 99%  Weight:   87.7 kg (193 lb 6.4 oz)   Height:        Intake/Output Summary (Last 24 hours) at 05/09/16 0858 Last data filed at 05/08/16 1840  Gross per 24 hour  Intake              630 ml  Output                0 ml  Net              630 ml    Exam:  Examination:  General exam: Appears calm and comfortable  Respiratory system: Clear to auscultation. Respiratory effort normal. Cardiovascular system: S1 & S2 heard, RRR. No JVD, murmurs, rubs, gallops or clicks. No pedal edema. Gastrointestinal system: Abdomen is nondistended, soft and nontender. No organomegaly or masses felt. Normal bowel sounds heard. Central nervous system: Alert and oriented. No focal neurological deficits. Extremities: Symmetric 5 x 5 power. Skin: No rashes, lesions or ulcers Psychiatry: Judgement and insight appear normal. Mood & affect appropriate.     Data Reviewed: I have personally reviewed following labs and imaging studies  Micro Results Recent Results (from the past 240 hour(s))  MRSA  PCR Screening     Status: None   Collection Time: 05/07/16  5:53 PM  Result Value Ref Range Status   MRSA by PCR NEGATIVE NEGATIVE Final    Comment:        The GeneXpert MRSA Assay (FDA approved for NASAL specimens only), is one component of a comprehensive MRSA colonization surveillance program. It is not intended to diagnose MRSA infection nor to guide or monitor treatment for MRSA infections.     Radiology Reports Ir US Guide Vasc Access Left  Result Date: 05/07/2016 CLINICAL DATA:  End-stage renal disease. Occluded left upper arm if AV dialysis fistula. EXAM: DIALYSIS FISTULA DECLOT VENOUS ANGIOPLASTY ULTRASOUND GUIDANCE FOR VASCULAR ACCESS FLUOROSCOPY TIME:  5.7 minutes, 30  uGym2 DAP TECHNIQUE: The procedure, risks (including but not limited to bleeding, infection, organ damage ), benefits, and alternatives were explained to the patient. Questions regarding the procedure were encouraged and answered. The patient understands and consents to the procedure. Intravenous Fentanyl and Versed were administered as conscious sedation during continuous monitoring of the patient's level of consciousness and physiological / cardiorespiratory status by the radiology RN, with a total moderate sedation time of 38 minutes. The outflow vein of the fistula just central to the anastomosis was accessed antegrade with 21-gauge micropuncture needle under real-time ultrasonic guidance after the overlying skin prepped with Betadine, draped in usual sterile fashion, infiltrated locally with 1%lidocaine. Needle exchanged over 018guidewire for transitional dilator through which a Bentson wire was advanced to . Over this a 68F sheath was placed, through which a 5 [place] Kumpe catheter was advanced, negotiated through the outflow vein. 2 mg tPA were administered within the thrombosed segment of the outflow vein. Once the catheter advanced centrally outflow venography was performed demonstrating patency of the central  venous system through the SVC . 3000 units heparin were administered IV. The Kumpe was exchanged over an Amplatz guide wire for the AngioJet device, used to mechanically thrombolyse the outflow cephalic vein and remove the thrombus from the native outflow. This was exchanged for a 7 mm x 4 cm Conquest angioplasty balloon, advanced to the outflow vein. Angioplasty of the cephalic arch was performed. Injection showed antegrade flow with partial clearance of thrombus from the graft. No extravasation or apparent complication of angioplasty. Reflux injection showed no significant stenosis at the anastomosis. The opacified native arterial systems unremarkable. Additional passes of the Angiojet catheter cleared some of the residual mural thrombus in the dilated outflow cephalic vein. Follow up shuntogram showed good flow through the outflow vein, no extravasation. She The catheter and sheath was then removed and hemostasis achieved with 2-0 Ethilon suture. Patient tolerated procedure well. IMPRESSION: 1. Technically successful declot of left upper arm hemodialysis fistula. 2. Technically successful balloon angioplasty of central aspect of outflow cephalic vein. ACCESS: Remains approachable for percutaneous intervention as needed. Electronically Signed   By:  Lucrezia Europe M.D.   On: 05/06/2016 17:11   Dg Chest Port 1 View  Result Date: 05/07/2016 CLINICAL DATA:  83 year old female with left-sided chest pain. EXAM: PORTABLE CHEST 1 VIEW COMPARISON:  Chest radiograph dated 04/22/2016 and 12/11/2015 FINDINGS: There is eventration of the left hemidiaphragm similar to the prior radiographs. A small left pleural effusion or left lung base infiltrate is not entirely excluded. Clinical correlation is recommended. The right lung is clear. There is no pneumothorax. There is stable cardiac silhouette. Atherosclerotic calcification of the aortic arch. Mild central vascular prominence as seen on the prior radiograph may represent mild  vascular congestion. There is degenerative changes of the shoulders. No acute osseous pathology. IMPRESSION: 1. Persistent elevation of the left hemidiaphragm with associated subsegmental atelectasis of the left lung base. A small left sided pleural effusion or left lung base infiltrate is not entirely excluded. Clinical correlation is recommended. 2. Mild central vascular prominence similar to prior studies may represent mild vascular congestion. Electronically Signed   By: Anner Crete M.D.   On: 05/07/2016 06:59   Dg Chest Portable 1 View  Result Date: 04/22/2016 CLINICAL DATA:  Shortness of breath since yesterday. EXAM: PORTABLE CHEST 1 VIEW COMPARISON:  12/11/2015 FINDINGS: Chronic cardiomegaly. Chronic aortic atherosclerosis. Poor inspiration. Possible pulmonary venous hypertension without frank edema. No visible effusion. IMPRESSION: Chronic cardiomegaly and aortic atherosclerosis. Possible pulmonary venous hypertension. Electronically Signed   By: Nelson Chimes M.D.   On: 04/22/2016 12:27   Ir Thrombectomy Av Fistula W/thrombolysis/pta Inc/shunt/img Left  Result Date: 05/06/2016 CLINICAL DATA:  End-stage renal disease. Occluded left upper arm if AV dialysis fistula. EXAM: DIALYSIS FISTULA DECLOT VENOUS ANGIOPLASTY ULTRASOUND GUIDANCE FOR VASCULAR ACCESS FLUOROSCOPY TIME:  5.7 minutes, 75  uGym2 DAP TECHNIQUE: The procedure, risks (including but not limited to bleeding, infection, organ damage ), benefits, and alternatives were explained to the patient. Questions regarding the procedure were encouraged and answered. The patient understands and consents to the procedure. Intravenous Fentanyl and Versed were administered as conscious sedation during continuous monitoring of the patient's level of consciousness and physiological / cardiorespiratory status by the radiology RN, with a total moderate sedation time of 38 minutes. The outflow vein of the fistula just central to the anastomosis was accessed  antegrade with 21-gauge micropuncture needle under real-time ultrasonic guidance after the overlying skin prepped with Betadine, draped in usual sterile fashion, infiltrated locally with 1%lidocaine. Needle exchanged over 018guidewire for transitional dilator through which a Bentson wire was advanced to . Over this a 52F sheath was placed, through which a 5 [place] Kumpe catheter was advanced, negotiated through the outflow vein. 2 mg tPA were administered within the thrombosed segment of the outflow vein. Once the catheter advanced centrally outflow venography was performed demonstrating patency of the central venous system through the SVC . 3000 units heparin were administered IV. The Kumpe was exchanged over an Amplatz guide wire for the AngioJet device, used to mechanically thrombolyse the outflow cephalic vein and remove the thrombus from the native outflow. This was exchanged for a 7 mm x 4 cm Conquest angioplasty balloon, advanced to the outflow vein. Angioplasty of the cephalic arch was performed. Injection showed antegrade flow with partial clearance of thrombus from the graft. No extravasation or apparent complication of angioplasty. Reflux injection showed no significant stenosis at the anastomosis. The opacified native arterial systems unremarkable. Additional passes of the Angiojet catheter cleared some of the residual mural thrombus in the dilated outflow cephalic vein. Follow up shuntogram showed  good flow through the outflow vein, no extravasation. She The catheter and sheath was then removed and hemostasis achieved with 2-0 Ethilon suture. Patient tolerated procedure well. IMPRESSION: 1. Technically successful declot of left upper arm hemodialysis fistula. 2. Technically successful balloon angioplasty of central aspect of outflow cephalic vein. ACCESS: Remains approachable for percutaneous intervention as needed. Electronically Signed   By: Lucrezia Europe M.D.   On: 05/06/2016 17:11     CBC  Recent  Labs Lab 05/06/16 1435 05/07/16 0656 05/08/16 0115  WBC 9.5 12.0* 8.3  HGB 9.3* 9.8* 8.6*  HCT 28.6* 29.1* 27.0*  PLT 160 132* 121*  MCV 77.9* 78.9 78.9  MCH 25.3* 26.6 25.1*  MCHC 32.5 33.7 31.9  RDW 15.0 15.2 15.1  LYMPHSABS  --  1.1  --   MONOABS  --  1.0  --   EOSABS  --  0.0  --   BASOSABS  --  0.0  --     Chemistries   Recent Labs Lab 05/06/16 1435 05/07/16 0656 05/07/16 1126 05/08/16 0115  NA 139 139  --  136  K 4.9 5.7*  --  4.1  CL 97* 99*  --  97*  CO2 30 28  --  28  GLUCOSE 106* 108*  --  181*  BUN 78* 91*  --  36*  CREATININE 10.74* 11.59* 12.18* 6.45*  CALCIUM 9.0 9.0  --  8.5*  AST  --   --   --  25  ALT  --   --   --  14  ALKPHOS  --   --   --  75  BILITOT  --   --   --  0.5   ------------------------------------------------------------------------------------------------------------------ estimated creatinine clearance is 6.5 mL/min (A) (by C-G formula based on SCr of 6.45 mg/dL (H)). ------------------------------------------------------------------------------------------------------------------ No results for input(s): HGBA1C in the last 72 hours. ------------------------------------------------------------------------------------------------------------------ No results for input(s): CHOL, HDL, LDLCALC, TRIG, CHOLHDL, LDLDIRECT in the last 72 hours. ------------------------------------------------------------------------------------------------------------------ No results for input(s): TSH, T4TOTAL, T3FREE, THYROIDAB in the last 72 hours.  Invalid input(s): FREET3 ------------------------------------------------------------------------------------------------------------------ No results for input(s): VITAMINB12, FOLATE, FERRITIN, TIBC, IRON, RETICCTPCT in the last 72 hours.  Coagulation profile  Recent Labs Lab 05/06/16 1435  INR 1.08    No results for input(s): DDIMER in the last 72 hours.  Cardiac Enzymes  Recent Labs Lab  05/07/16 1126 05/07/16 1914 05/08/16 0115  TROPONINI 0.46* 0.24* 0.16*   ------------------------------------------------------------------------------------------------------------------ Invalid input(s): POCBNP   CBG: No results for input(s): GLUCAP in the last 168 hours.     Studies: No results found.    Lab Results  Component Value Date   HGBA1C 5.9 (H) 10/02/2015   HGBA1C 5.5 04/22/2015   HGBA1C 5.7 (H) 10/15/2014   Lab Results  Component Value Date   LDLCALC 67 02/12/2014   CREATININE 6.45 (H) 05/08/2016       Scheduled Meds: . albuterol  3 mL Inhalation QID  . amLODipine  10 mg Oral q morning - 10a  . aspirin EC  81 mg Oral Daily  . cinacalcet  30 mg Oral Q breakfast  . citalopram  20 mg Oral Daily  . darbepoetin (ARANESP) injection - DIALYSIS  60 mcg Intravenous Q Fri-HD  . docusate sodium  100 mg Oral BID  . [START ON 12-21-202018] doxercalciferol  4 mcg Intravenous Q M,W,F-HD  . heparin  5,000 Units Subcutaneous Q8H  . isosorbide mononitrate  60 mg Oral Daily  . latanoprost  1 drop Both Eyes QHS  .  levothyroxine  25 mcg Oral QAC breakfast  . mouth rinse  15 mL Mouth Rinse BID  . mirtazapine  7.5 mg Oral QHS  . pantoprazole  40 mg Oral Daily  . polyethylene glycol  17 g Oral Daily  . sevelamer carbonate  2,400 mg Oral TID WC  . sodium chloride flush  3 mL Intravenous Q12H  . sodium polystyrene  15 g Oral Once   Continuous Infusions: . sodium chloride       LOS: 0 days    Time spent: >30 MINS    Reyne Dumas  [facility] Pager [phone]. If 7PM-7AM, please contact night-coverage at [email or web address], password [facility] 05/09/2016, 8:58 AM  LOS: 0 days

## 2016-05-09 NOTE — Progress Notes (Signed)
Patient ID: [name], female   DOB: 1925-02-08, 83 y.o.   MRN: [id number]  [facility] Progress Note   Assessment/ Plan:   1. Atypical chest pain: Suspected to be from dysphagia/odynophagia/esophageal source rather than cardiac in etiology based on data so far-awaiting barium swallow 2. ESRD: hemodialysis ordered for tomorrow to continue with a Monday/Wednesday/Friday schedule. She underwent thrombectomy of her left brachiocephalic fistula with 7 mm angioplasty of what appears to be cephalic arch and cephalic outflow vein stenosis by interventional radiology. 3. Anemia: Hemoglobin level low, continue ESA with dialysis. 4. CKD-MBD: Continue Sensipar and Hectorol for PTH suppression with ongoing sevelamer for phosphorus binding 5. Nutrition: Evaluated by speech pathology and found to have significant dysphagia with recommendations for dysphagia 3 diet and reevaluation in the future. 6. Hypertension: Blood pressures appear to be of satisfactory control, continue to monitor with ultrafiltration and hemodialysis.  Subjective:   Reports that she is frustrated by the length of hospitalization and continues to complain of difficulty swallowing    Objective:   BP 133/63 (BP Location: Right Arm)   Pulse 74   Temp 98.7 F (37.1 C) (Oral)   Resp 16   Ht 5\' 4"  (1.626 m)   Wt 87.7 kg (193 lb 6.4 oz)   LMP 12/24/2010   SpO2 99%   BMI 33.20 kg/m   Physical Exam: RKY:[id number] comfortably in bed, watching television CVS: Pulse regular rhythm, normal rate, S1 and S2 normal Resp: Clear to auscultation, no rales or rhonchi Abd: Soft, obese, nontender Ext: No lower extremity edema, left brachiocephalic fistula aneurysmal and tortuous with good thrill  Labs: BMET  Recent Labs Lab 05/06/16 1435 05/07/16 0656 05/07/16 1126 05/08/16 0115  NA 139 139  --  136  K 4.9 5.7*  --  4.1  CL 97* 99*  --  97*  CO2 30 28  --  28  GLUCOSE 106* 108*  --  181*  BUN 78* 91*  --  36*   CREATININE 10.74* 11.59* 12.18* 6.45*  CALCIUM 9.0 9.0  --  8.5*   CBC  Recent Labs Lab 05/06/16 1435 05/07/16 0656 05/08/16 0115  WBC 9.5 12.0* 8.3  NEUTROABS  --  9.9*  --   HGB 9.3* 9.8* 8.6*  HCT 28.6* 29.1* 27.0*  MCV 77.9* 78.9 78.9  PLT 160 132* 121*   Medications:    . albuterol  3 mL Inhalation TID  . amLODipine  10 mg Oral q morning - 10a  . aspirin EC  81 mg Oral Daily  . cinacalcet  30 mg Oral Q breakfast  . citalopram  20 mg Oral Daily  . darbepoetin (ARANESP) injection - DIALYSIS  60 mcg Intravenous Q Fri-HD  . docusate sodium  100 mg Oral BID  . [START ON October 06, 202018] doxercalciferol  4 mcg Intravenous Q M,W,F-HD  . heparin  5,000 Units Subcutaneous Q8H  . isosorbide mononitrate  60 mg Oral Daily  . latanoprost  1 drop Both Eyes QHS  . levothyroxine  25 mcg Oral QAC breakfast  . mouth rinse  15 mL Mouth Rinse BID  . mirtazapine  7.5 mg Oral QHS  . pantoprazole  40 mg Oral Daily  . polyethylene glycol  17 g Oral Daily  . sevelamer carbonate  2,400 mg Oral TID WC  . sodium chloride flush  3 mL Intravenous Q12H  . sodium polystyrene  15 g Oral Once   Elmarie Shiley, MD 05/09/2016, 11:22 AM

## 2016-05-10 ENCOUNTER — Observation Stay (HOSPITAL_COMMUNITY): Payer: Medicare HMO

## 2016-05-10 DIAGNOSIS — E0821 Diabetes mellitus due to underlying condition with diabetic nephropathy: Secondary | ICD-10-CM | POA: Diagnosis not present

## 2016-05-10 DIAGNOSIS — N186 End stage renal disease: Secondary | ICD-10-CM | POA: Diagnosis not present

## 2016-05-10 DIAGNOSIS — Z992 Dependence on renal dialysis: Secondary | ICD-10-CM | POA: Diagnosis not present

## 2016-05-10 DIAGNOSIS — I1311 Hypertensive heart and chronic kidney disease without heart failure, with stage 5 chronic kidney disease, or end stage renal disease: Secondary | ICD-10-CM | POA: Diagnosis not present

## 2016-05-10 DIAGNOSIS — I200 Unstable angina: Secondary | ICD-10-CM | POA: Diagnosis not present

## 2016-05-10 DIAGNOSIS — D631 Anemia in chronic kidney disease: Secondary | ICD-10-CM | POA: Diagnosis not present

## 2016-05-10 DIAGNOSIS — R1310 Dysphagia, unspecified: Secondary | ICD-10-CM | POA: Diagnosis not present

## 2016-05-10 DIAGNOSIS — R0789 Other chest pain: Secondary | ICD-10-CM | POA: Diagnosis not present

## 2016-05-10 DIAGNOSIS — R079 Chest pain, unspecified: Secondary | ICD-10-CM | POA: Diagnosis not present

## 2016-05-10 DIAGNOSIS — N184 Chronic kidney disease, stage 4 (severe): Secondary | ICD-10-CM | POA: Diagnosis not present

## 2016-05-10 DIAGNOSIS — N185 Chronic kidney disease, stage 5: Secondary | ICD-10-CM | POA: Diagnosis not present

## 2016-05-10 DIAGNOSIS — F0390 Unspecified dementia without behavioral disturbance: Secondary | ICD-10-CM | POA: Diagnosis not present

## 2016-05-10 LAB — CBC
HCT: 26.2 % — ABNORMAL LOW (ref 36.0–46.0)
Hemoglobin: 8.8 g/dL — ABNORMAL LOW (ref 12.0–15.0)
MCH: 26.2 pg (ref 26.0–34.0)
MCHC: 33.6 g/dL (ref 30.0–36.0)
MCV: 78 fL (ref 78.0–100.0)
PLATELETS: 144 10*3/uL — AB (ref 150–400)
RBC: 3.36 MIL/uL — AB (ref 3.87–5.11)
RDW: 14.7 % (ref 11.5–15.5)
WBC: 8.5 10*3/uL (ref 4.0–10.5)

## 2016-05-10 LAB — COMPREHENSIVE METABOLIC PANEL
ALT: 13 U/L — AB (ref 14–54)
AST: 18 U/L (ref 15–41)
Albumin: 3.1 g/dL — ABNORMAL LOW (ref 3.5–5.0)
Alkaline Phosphatase: 80 U/L (ref 38–126)
Anion gap: 10 (ref 5–15)
BUN: 61 mg/dL — ABNORMAL HIGH (ref 6–20)
CHLORIDE: 95 mmol/L — AB (ref 101–111)
CO2: 28 mmol/L (ref 22–32)
Calcium: 8.4 mg/dL — ABNORMAL LOW (ref 8.9–10.3)
Creatinine, Ser: 10.18 mg/dL — ABNORMAL HIGH (ref 0.44–1.00)
GFR, EST AFRICAN AMERICAN: 3 mL/min — AB (ref >60)
GFR, EST NON AFRICAN AMERICAN: 3 mL/min — AB (ref >60)
Glucose, Bld: 136 mg/dL — ABNORMAL HIGH (ref 65–99)
POTASSIUM: 5.3 mmol/L — AB (ref 3.5–5.1)
SODIUM: 133 mmol/L — AB (ref 135–145)
Total Bilirubin: 0.1 mg/dL — ABNORMAL LOW (ref 0.3–1.2)
Total Protein: 6.1 g/dL — ABNORMAL LOW (ref 6.5–8.1)

## 2016-05-10 MED ORDER — DILTIAZEM HCL ER COATED BEADS 180 MG PO CP24: 180.0000 mg | ORAL_CAPSULE | Freq: Every day | ORAL | 1 refills | Status: DC

## 2016-05-10 MED ORDER — HEPARIN SODIUM (PORCINE) 1000 UNIT/ML DIALYSIS
5000.0000 [IU] | INTRAMUSCULAR | Status: DC | PRN
  Administered 2016-05-10: 5000 [IU] via INTRAVENOUS_CENTRAL
  Filled 2016-05-10 (×2): qty 5

## 2016-05-10 MED ORDER — DOXERCALCIFEROL 4 MCG/2ML IV SOLN
INTRAVENOUS | Status: AC
  Filled 2016-05-10: qty 2

## 2016-05-10 MED ORDER — PANTOPRAZOLE SODIUM 40 MG PO TBEC: 40.0000 mg | DELAYED_RELEASE_TABLET | Freq: Every day | ORAL | 2 refills | Status: DC

## 2016-05-10 NOTE — Progress Notes (Signed)
SLP Cancellation Note  Patient Details Name: ALYSIANA ETHRIDGE MRN: [id number] DOB: September 30, 1925   Cancelled treatment:       Reason Eval/Treat Not Completed: Patient at procedure or test/unavailable. Late note for treatment attempted this am however patient had not yet had esophagram. Will f/u 5/1.   [place], [place] [phone]    Gabriel Rainwater Meryl 06/28/2016, 2:51 PM

## 2016-05-10 NOTE — Consult Note (Signed)
           [facility] CM Primary Care Navigator  2020-01-1716  [name] December 20, 1925 [id number]   Went to see patient in the roomto identify possible discharge needs but RN reports that sheis in hemodialysis at this time.  Will attempt to see patient again at another time when she is available in room.  For questions, please contact:  Dannielle Huh, BSN, RN- [facility] Primary Care Navigator  Telephone: [phone] [place]

## 2016-05-10 NOTE — Discharge Instructions (Signed)
Dysphagia 3 (Mech soft);Thin liquid   Liquid Administration via: Cup;Straw Medication Administration: Whole meds with liquid Supervision: Patient able to self feed Compensations: Minimize environmental distractions;Slow rate;Small sips/bites Postural Changes: Seated upright at 90 degrees

## 2016-05-10 NOTE — Progress Notes (Signed)
Order received to discharge patient.  Telemetry monitor removed and CCMD notified.  PIV access removed.  Discharge instructions, follow up, medications and instructions for their use discussed with patient and family.

## 2016-05-10 NOTE — Evaluation (Signed)
Physical Therapy Evaluation Patient Details Name: ANESA FRONEK MRN: [id number] DOB: 08-24-1925 Today's Date: September 23, 202018   History of Present Illness   [name] is a 83 y.o. female with medical history significant of ESRD on hemodialysis, M-W-F, recent Declot left arm Fistula outpatient on 4-26, HTN, gout who presents complaining of chest pain.  Clinical Impression   Pt admitted with above diagnosis. Pt currently with functional limitations due to the deficits listed below (see PT Problem List). Overall moving well; frustrated with being in the hospital; Took time to validate pt's frustration with the situation, and gain some rapport, with goal of increasing pt's participation in therapy; Recommend HHPT follow up;  Pt will benefit from skilled PT to increase their independence and safety with mobility to allow discharge to the venue listed below.       Follow Up Recommendations Home health PT (at ALF)    Equipment Recommendations  None recommended by PT    Recommendations for Other Services       Precautions / Restrictions Precautions Precautions: Fall Precaution Comments: Fall risk is lessened with use of assistive device      Mobility  Bed Mobility Overal bed mobility: Modified Independent             General bed mobility comments: increased time and use of rails  Transfers Overall transfer level: Needs assistance Equipment used: Rolling walker (2 wheeled) Transfers: Sit to/from Stand Sit to Stand: Min guard         General transfer comment: Minguard for safety; dependent on momentum; 3 tries before successful stand  Ambulation/Gait Ambulation/Gait assistance: Min guard Ambulation Distance (Feet): 100 Feet Assistive device: Rolling walker (2 wheeled) Gait Pattern/deviations: Step-through pattern;Trunk flexed;Decreased step length - right;Decreased step length - left;Decreased stride length     General Gait Details: Cues to self-monitor for activity  tolerance; walked on 2 L O2 via [place], which is her baseline  Science writer    Modified Rankin (Stroke Patients Only)       Balance Overall balance assessment: Needs assistance   Sitting balance-Leahy Scale: Good       Standing balance-Leahy Scale: Fair                               Pertinent Vitals/Pain Pain Assessment: No/denies pain Pain Location: Pt denies pain and insists that what she has is reflux    Home Living Family/patient expects to be discharged to:: Assisted living               Home Equipment: Gilford Rile - 4 wheels Additional Comments: Lives at [facility]    Prior Function Level of Independence: Independent with assistive device(s)   Gait / Transfers Assistance Needed: walks to dining hall with RW; reports no difficulty     Comments: I didn't get a clear picture of how much ADL assist they provide at her ALF     Hand Dominance        Extremity/Trunk Assessment   Upper Extremity Assessment Upper Extremity Assessment: Generalized weakness    Lower Extremity Assessment Lower Extremity Assessment: Generalized weakness       Communication   Communication: HOH  Cognition Arousal/Alertness: Awake/alert Behavior During Therapy: WFL for tasks assessed/performed (voiced annoyance at being in the hospital) Overall Cognitive Status: Within Functional Limits for tasks assessed  General Comments: Ms. Needles was extremely frustrated with her situation; she insists that she is weaker fro being in the hospital and REALLY wants to go home      General Comments      Exercises     Assessment/Plan    PT Assessment Patient needs continued PT services  PT Problem List Decreased strength;Decreased activity tolerance;Decreased balance;Decreased mobility       PT Treatment Interventions DME instruction;Gait training;Stair training;Functional mobility  training;Therapeutic activities;Therapeutic exercise;Patient/family education;Balance training    PT Goals (Current goals can be found in the Care Plan section)  Acute Rehab PT Goals Patient Stated Goal: Home soon PT Goal Formulation: With patient Time For Goal Achievement: 05/17/16 Potential to Achieve Goals: Good    Frequency Min 3X/week   Barriers to discharge        Co-evaluation               End of Session Equipment Utilized During Treatment: Gait belt Activity Tolerance: Patient tolerated treatment well Patient left: in chair;with call bell/phone within reach;with chair alarm set Nurse Communication: Mobility status PT Visit Diagnosis: Unsteadiness on feet (R26.81)    Time: 6073-7106 PT Time Calculation (min) (ACUTE ONLY): 28 min   Charges:   PT Evaluation $PT Eval Low Complexity: 1 Procedure PT Treatments $Gait Training: 8-22 mins   PT G Codes:   PT G-Codes **NOT FOR INPATIENT CLASS** Functional Assessment Tool Used: Clinical judgement Functional Limitation: Mobility: Walking and moving around Mobility: Walking and Moving Around Current Status ([id number]): At least 20 percent but less than 40 percent impaired, limited or restricted Mobility: Walking and Moving Around Goal Status [phone]): At least 1 percent but less than 20 percent impaired, limited or restricted    Roney Marion, PT  Acute Rehabilitation Services Pager [phone] Office [place] 02/11/2016, 10:24 AM

## 2016-05-10 NOTE — Progress Notes (Signed)
Patient ID: [name], female   DOB: [birth year], 83 y.o.   MRN: [id number]  [facility] Progress Note   Assessment/ Plan:   1. Atypical chest pain: Likely GI in etiology.  2. ESRD: For HD today on MWF schedule; [facility] outpatient.  She underwent thrombectomy of her left brachiocephalic fistula with 7 mm angioplasty of what appears to be cephalic arch and cephalic outflow vein stenosis by interventional radiology. 2K bath 3L UF today 3. Anemia: continue ESA with dialysis. 4. CKD-MBD: Cont outpatient medications 5. Hypertension: Blood pressures appear to be of satisfactory control, continue to monitor with ultrafiltration and hemodialysis.  Subjective:   Barium swallow study today with dysmotility Pt states she feels much improved, less dysphagia For DC today after HD, [facility]   Objective:   BP (!) 173/59 (BP Location: Right Wrist)   Pulse 69   Temp 98.1 F (36.7 C) (Oral)   Resp 18   Ht 5\' 4"  (1.626 m)   Wt 87.9 kg (193 lb 12.8 oz)   LMP 12/24/2010   SpO2 100%   BMI 33.27 kg/m   Physical Exam: [id number] comfortably in chair CVS: Pulse regular rhythm, normal rate, S1 and S2 normal; 2/6 MSM at RUSB Resp: Clear to auscultation, no rales or rhonchi Abd: Soft, obese, nontender Ext: No lower extremity edema, left brachiocephalic fistula aneurysmal and tortuous with good thrill  Labs: BMET  Recent Labs Lab 05/06/16 1435 05/07/16 0656 05/07/16 1126 05/08/16 0115 05/10/16 0330  NA 139 139  --  136 133*  K 4.9 5.7*  --  4.1 5.3*  CL 97* 99*  --  97* 95*  CO2 30 28  --  28 28  GLUCOSE 106* 108*  --  181* 136*  BUN 78* 91*  --  36* 61*  CREATININE 10.74* 11.59* 12.18* 6.45* 10.18*  CALCIUM 9.0 9.0  --  8.5* 8.4*   CBC  Recent Labs Lab 05/06/16 1435 05/07/16 0656 05/08/16 0115  WBC 9.5 12.0* 8.3  NEUTROABS  --  9.9*  --   HGB 9.3* 9.8* 8.6*  HCT 28.6* 29.1* 27.0*  MCV 77.9* 78.9 78.9  PLT 160 132* 121*   Medications:    .  amLODipine  10 mg Oral q morning - 10a  . aspirin EC  81 mg Oral Daily  . cinacalcet  30 mg Oral Q breakfast  . citalopram  20 mg Oral Daily  . darbepoetin (ARANESP) injection - DIALYSIS  60 mcg Intravenous Q Fri-HD  . docusate sodium  100 mg Oral BID  . doxercalciferol  4 mcg Intravenous Q M,W,F-HD  . heparin  5,000 Units Subcutaneous Q8H  . isosorbide mononitrate  60 mg Oral Daily  . latanoprost  1 drop Both Eyes QHS  . levothyroxine  25 mcg Oral QAC breakfast  . mouth rinse  15 mL Mouth Rinse BID  . mirtazapine  7.5 mg Oral QHS  . pantoprazole  40 mg Oral Daily  . polyethylene glycol  17 g Oral Daily  . sevelamer carbonate  2,400 mg Oral TID WC  . sodium chloride flush  3 mL Intravenous Q12H  . sodium polystyrene  15 g Oral Once   Pearson Grippe, MD 11-05-2016, 12:49 PM

## 2016-05-10 NOTE — Discharge Summary (Signed)
Physician Discharge Summary  [name] MRN: [id number] DOB/AGE: Nov 17, 1925 83 y.o.  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)   Chronic respiratory failure (HCC)   ESRD (end stage renal disease) on dialysis (HCC)   Chronic diastolic heart failure (HCC)   Chest pain   Dysphagia    Follow-up recommendations Follow-up with PCP in 3-5 days , including all  additional recommended appointments as below Follow-up CBC, CMP in 3-5 days Started on cardizem CD for Severe esophageal dysmotility Patient will benefit from outpatient ENT evaluation for hoarseness    Current Discharge Medication List    START taking these medications   Details  diltiazem (CARDIZEM CD) 180 MG 24 hr capsule Take 1 capsule (180 mg total) by mouth daily. Qty: 30 capsule, Refills: 1    pantoprazole (PROTONIX) 40 MG tablet Take 1 tablet (40 mg total) by mouth daily. Qty: 30 tablet, Refills: 2      CONTINUE these medications which have NOT CHANGED   Details  albuterol (PROVENTIL) (2.5 MG/3ML) 0.083% nebulizer solution USE 1 VIAL IN NEBULIZER 3 TIMES DAILY AS NEEDED FOR WHEEZING/SHORTNESS OF BREATH. Qty: 360 mL, Refills: 0    bisacodyl (DULCOLAX) 10 MG suppository 1 suppository rectally  Daily every 3 days as needed for constipation Qty: 12 suppository, Refills: 2    citalopram (CELEXA) 20 MG tablet TAKE (1) TABLET BY MOUTH ONCE IN THE MORNING. Qty: 31 tablet, Refills: 11    guaiFENesin-dextromethorphan (ROBITUSSIN DM) 100-10 MG/5ML syrup TAKE ONE TEASPOONFUL BY MOUTH EVERY 4 HOURS AS NEEDED FOR COUGH Qty: 120 mL, Refills: 2    ipratropium (ATROVENT) 0.02 % nebulizer solution USE 1 VIAL IN NEBULIZER 3 TIMES DAILY AS NEEDED FOR WHEEZING/SHORTNESS OF  BREATH. Qty: 360 mL, Refills: 0    isosorbide mononitrate (IMDUR) 60 MG 24 hr tablet TAKE (1) TABLET BY MOUTH ONCE DAILY. Qty: 31 tablet, Refills: 11    Lancets MISC Use as directed to monitor FSBS 1x daily x2 weeks, then 1x daily PRN S/Sx hypoglycemia. Dx: E11.9. Qty: 50 each, Refills: 1    levothyroxine (SYNTHROID, LEVOTHROID) 25 MCG tablet TAKE (1) TABLET BY MOUTH ONCE DAILY BEFORE BREAKFAST. Qty: 31 tablet, Refills: 11    lidocaine-prilocaine (EMLA) cream APPLY TOPICALLY TO LEFT UPPER ARM 1 HOUR PRIOR TO DIALYSIS ON MONDAY,WEDNESDAY & FRIDAYS AS NEEDED FOR PAIN. WRAP WITH PLASTIC WRAP AFTER A Qty: 30 g, Refills: 11    mirtazapine (REMERON) 15 MG tablet TAKE 1/2 TABLET (7.5MG) BY MOUTH AT BEDTIME FOR MOOD/APPETITE. Qty: 16 tablet, Refills: 11    Multiple Vitamin (DAILY-VITE) TABS TAKE 1 TABLET BY MOUTH ONCE DAILY. (ON DAY OF DIALYSIS, TAKE AFTER DIALYSIS) Qty: 31 tablet, Refills: 11    nitroGLYCERIN (NITROSTAT) 0.4 MG SL tablet DISSOLVE 1 TABLET UNDER THE TONGUE EVERY 5 MINUTES X2 DOSES THEN TO ER Qty: 25 tablet, Refills: 0    OXYGEN Inhale 2 L into the lungs daily as needed (shortness of breath).    polyethylene glycol powder (MIRALAX) powder Take 17 g by mouth daily. Hold for diarrhea Qty: 255 g, Refills: 6    polyvinyl alcohol (LIQUIFILM TEARS) 1.4 % ophthalmic solution 1 drop as needed for dry eyes.    QC LO-DOSE ASPIRIN 81 MG EC tablet TAKE (1)  TABLET BY MOUTH ONCE DAILY. Qty: 31 tablet, Refills: 11    SENSIPAR 30 MG tablet TAKE 1 TABLET BY MOUTH ONCE DAILY AFTER THE EVENING MEAL Qty: 31 tablet, Refills: PRN    sevelamer (RENVELA) 800 MG tablet Take 800-2,400 mg by mouth 5 (five) times daily. 2435m three times daily with meals and 8022mtwice daily with snacks    STOOL SOFTENER 100 MG capsule TAKE 1 CAPSULE BY MOUTH TWICE DAILY. Qty: 62 capsule, Refills: 11    Travoprost, BAK Free, (TRAVATAN) 0.004 % SOLN ophthalmic solution Place 1 drop into both eyes at  bedtime. Qty: 5 mL, Refills: 11    VENTOLIN HFA 108 (90 Base) MCG/ACT inhaler INHALE (2) PUFFS EVERY 4 HOURS AS NEEDED FOR WHEEZING OR SHORTNESS OFBREATH(COUGH). Qty: 18 g, Refills: 11      STOP taking these medications     amLODipine (NORVASC) 10 MG tablet      MAPAP 325 MG tablet      ranitidine (ZANTAC) 150 MG tablet          Discharge Condition:  Stable   Discharge Instructions Get Medicines reviewed and adjusted: Please take all your medications with you for your next visit with your Primary MD  Please request your Primary MD to go over all hospital tests and procedure/radiological results at the follow up, please ask your Primary MD to get all Hospital records sent to his/her office.  If you experience worsening of your admission symptoms, develop shortness of breath, life threatening emergency, suicidal or homicidal thoughts you must seek medical attention immediately by calling 911 or calling your MD immediately if symptoms less severe.  You must read complete instructions/literature along with all the possible adverse reactions/side effects for all the Medicines you take and that have been prescribed to you. Take any new Medicines after you have completely understood and accpet all the possible adverse reactions/side effects.   Do not drive when taking Pain medications.   Do not take more than prescribed Pain, Sleep and Anxiety Medications  Special Instructions: If you have smoked or chewed Tobacco in the last 2 yrs please stop smoking, stop any regular Alcohol and or any Recreational drug use.  Wear Seat belts while driving.  Please note  You were cared for by a hospitalist during your hospital stay. Once you are discharged, your primary care physician will handle any further medical issues. Please note that NO REFILLS for any discharge medications will be authorized once you are discharged, as it is imperative that you return to your primary care physician (or  establish a relationship with a primary care physician if you do not have one) for your aftercare needs so that they can reassess your need for medications and monitor your lab values.  Discharge Instructions    Diet - low sodium heart healthy    Complete by:  As directed    Increase activity slowly    Complete by:  As directed        No Known Allergies    Disposition: 01-Home or Self Care   Consults:  None     Significant Diagnostic Studies:  Dg Esophagus  Result Date: 06-22-202018 CLINICAL DATA:  Patient with chest pain and difficulty swallowing food. EXAM: ESOPHOGRAM/BARIUM SWALLOW TECHNIQUE: Single contrast examination was performed using  thin barium. FLUOROSCOPY TIME:  Fluoroscopy Time:  2 minutes COMPARISON:  Chest radiograph 05/07/2016 FINDINGS: Contrast flows throughout the esophagus. No evidence for esophageal stricture. Single-contrast technique limits evaluation for esophageal mass. There is  severe esophageal dysmotility with intra esophageal reflux. Spontaneous gastroesophageal reflux to the mid esophagus. IMPRESSION: Severe esophageal dysmotility. No evidence for stricture. Spontaneous gastroesophageal reflux to the midesophagus. Electronically Signed   By: Lovey Newcomer M.D.   On: Apr 09, 202018 10:55   Ir US Guide Vasc Access Left  Result Date: 05/07/2016 CLINICAL DATA:  End-stage renal disease. Occluded left upper arm if AV dialysis fistula. EXAM: DIALYSIS FISTULA DECLOT VENOUS ANGIOPLASTY ULTRASOUND GUIDANCE FOR VASCULAR ACCESS FLUOROSCOPY TIME:  5.7 minutes, 50  uGym2 DAP TECHNIQUE: The procedure, risks (including but not limited to bleeding, infection, organ damage ), benefits, and alternatives were explained to the patient. Questions regarding the procedure were encouraged and answered. The patient understands and consents to the procedure. Intravenous Fentanyl and Versed were administered as conscious sedation during continuous monitoring of the patient's level of  consciousness and physiological / cardiorespiratory status by the radiology RN, with a total moderate sedation time of 38 minutes. The outflow vein of the fistula just central to the anastomosis was accessed antegrade with 21-gauge micropuncture needle under real-time ultrasonic guidance after the overlying skin prepped with Betadine, draped in usual sterile fashion, infiltrated locally with 1%lidocaine. Needle exchanged over 018guidewire for transitional dilator through which a Bentson wire was advanced to . Over this a 49F sheath was placed, through which a 5 [place] Kumpe catheter was advanced, negotiated through the outflow vein. 2 mg tPA were administered within the thrombosed segment of the outflow vein. Once the catheter advanced centrally outflow venography was performed demonstrating patency of the central venous system through the SVC . 3000 units heparin were administered IV. The Kumpe was exchanged over an Amplatz guide wire for the AngioJet device, used to mechanically thrombolyse the outflow cephalic vein and remove the thrombus from the native outflow. This was exchanged for a 7 mm x 4 cm Conquest angioplasty balloon, advanced to the outflow vein. Angioplasty of the cephalic arch was performed. Injection showed antegrade flow with partial clearance of thrombus from the graft. No extravasation or apparent complication of angioplasty. Reflux injection showed no significant stenosis at the anastomosis. The opacified native arterial systems unremarkable. Additional passes of the Angiojet catheter cleared some of the residual mural thrombus in the dilated outflow cephalic vein. Follow up shuntogram showed good flow through the outflow vein, no extravasation. She The catheter and sheath was then removed and hemostasis achieved with 2-0 Ethilon suture. Patient tolerated procedure well. IMPRESSION: 1. Technically successful declot of left upper arm hemodialysis fistula. 2. Technically successful balloon  angioplasty of central aspect of outflow cephalic vein. ACCESS: Remains approachable for percutaneous intervention as needed. Electronically Signed   By: Lucrezia Europe M.D.   On: 05/06/2016 17:11   Dg Chest Port 1 View  Result Date: 05/07/2016 CLINICAL DATA:  83 year old female with left-sided chest pain. EXAM: PORTABLE CHEST 1 VIEW COMPARISON:  Chest radiograph dated 04/22/2016 and 12/11/2015 FINDINGS: There is eventration of the left hemidiaphragm similar to the prior radiographs. A small left pleural effusion or left lung base infiltrate is not entirely excluded. Clinical correlation is recommended. The right lung is clear. There is no pneumothorax. There is stable cardiac silhouette. Atherosclerotic calcification of the aortic arch. Mild central vascular prominence as seen on the prior radiograph may represent mild vascular congestion. There is degenerative changes of the shoulders. No acute osseous pathology. IMPRESSION: 1. Persistent elevation of the left hemidiaphragm with associated subsegmental atelectasis of the left lung base. A small left sided pleural effusion or left lung base infiltrate is  not entirely excluded. Clinical correlation is recommended. 2. Mild central vascular prominence similar to prior studies may represent mild vascular congestion. Electronically Signed   By: Anner Crete M.D.   On: 05/07/2016 06:59   Dg Chest Portable 1 View  Result Date: 04/22/2016 CLINICAL DATA:  Shortness of breath since yesterday. EXAM: PORTABLE CHEST 1 VIEW COMPARISON:  12/11/2015 FINDINGS: Chronic cardiomegaly. Chronic aortic atherosclerosis. Poor inspiration. Possible pulmonary venous hypertension without frank edema. No visible effusion. IMPRESSION: Chronic cardiomegaly and aortic atherosclerosis. Possible pulmonary venous hypertension. Electronically Signed   By: Nelson Chimes M.D.   On: 04/22/2016 12:27   Ir Thrombectomy Av Fistula W/thrombolysis/pta Inc/shunt/img Left  Result Date:  05/06/2016 CLINICAL DATA:  End-stage renal disease. Occluded left upper arm if AV dialysis fistula. EXAM: DIALYSIS FISTULA DECLOT VENOUS ANGIOPLASTY ULTRASOUND GUIDANCE FOR VASCULAR ACCESS FLUOROSCOPY TIME:  5.7 minutes, 85  uGym2 DAP TECHNIQUE: The procedure, risks (including but not limited to bleeding, infection, organ damage ), benefits, and alternatives were explained to the patient. Questions regarding the procedure were encouraged and answered. The patient understands and consents to the procedure. Intravenous Fentanyl and Versed were administered as conscious sedation during continuous monitoring of the patient's level of consciousness and physiological / cardiorespiratory status by the radiology RN, with a total moderate sedation time of 38 minutes. The outflow vein of the fistula just central to the anastomosis was accessed antegrade with 21-gauge micropuncture needle under real-time ultrasonic guidance after the overlying skin prepped with Betadine, draped in usual sterile fashion, infiltrated locally with 1%lidocaine. Needle exchanged over 018guidewire for transitional dilator through which a Bentson wire was advanced to . Over this a 81F sheath was placed, through which a 5 [place] Kumpe catheter was advanced, negotiated through the outflow vein. 2 mg tPA were administered within the thrombosed segment of the outflow vein. Once the catheter advanced centrally outflow venography was performed demonstrating patency of the central venous system through the SVC . 3000 units heparin were administered IV. The Kumpe was exchanged over an Amplatz guide wire for the AngioJet device, used to mechanically thrombolyse the outflow cephalic vein and remove the thrombus from the native outflow. This was exchanged for a 7 mm x 4 cm Conquest angioplasty balloon, advanced to the outflow vein. Angioplasty of the cephalic arch was performed. Injection showed antegrade flow with partial clearance of thrombus from the graft.  No extravasation or apparent complication of angioplasty. Reflux injection showed no significant stenosis at the anastomosis. The opacified native arterial systems unremarkable. Additional passes of the Angiojet catheter cleared some of the residual mural thrombus in the dilated outflow cephalic vein. Follow up shuntogram showed good flow through the outflow vein, no extravasation. She The catheter and sheath was then removed and hemostasis achieved with 2-0 Ethilon suture. Patient tolerated procedure well. IMPRESSION: 1. Technically successful declot of left upper arm hemodialysis fistula. 2. Technically successful balloon angioplasty of central aspect of outflow cephalic vein. ACCESS: Remains approachable for percutaneous intervention as needed. Electronically Signed   By: Lucrezia Europe M.D.   On: 05/06/2016 17:11            Filed Weights   05/08/16 0423 05/09/16 0500 05/10/16 0600  Weight: 85.4 kg (188 lb 3.2 oz) 87.7 kg (193 lb 6.4 oz) 87.9 kg (193 lb 12.8 oz)     Microbiology: Recent Results (from the past 240 hour(s))  MRSA PCR Screening     Status: None   Collection Time: 05/07/16  5:53 PM  Result Value Ref Range  Status   MRSA by PCR NEGATIVE NEGATIVE Final    Comment:        The GeneXpert MRSA Assay (FDA approved for NASAL specimens only), is one component of a comprehensive MRSA colonization surveillance program. It is not intended to diagnose MRSA infection nor to guide or monitor treatment for MRSA infections.        Blood Culture    Component Value Date/Time   SDES URINE, CLEAN CATCH 02/02/2011 0446   SPECREQUEST NONE 02/02/2011 0446   CULT  02/02/2011 0446    Multiple bacterial morphotypes present, none predominant. Suggest appropriate recollection if clinically indicated.   REPTSTATUS 02/03/2011 FINAL 02/02/2011 0446      Labs: Results for orders placed or performed during the hospital encounter of 05/07/16 (from the past 48 hour(s))  Comprehensive  metabolic panel     Status: Abnormal   Collection Time: 05/10/16  3:30 AM  Result Value Ref Range   Sodium 133 (L) 135 - 145 mmol/L   Potassium 5.3 (H) 3.5 - 5.1 mmol/L   Chloride 95 (L) 101 - 111 mmol/L   CO2 28 22 - 32 mmol/L   Glucose, Bld 136 (H) 65 - 99 mg/dL   BUN 61 (H) 6 - 20 mg/dL   Creatinine, Ser 10.18 (H) 0.44 - 1.00 mg/dL   Calcium 8.4 (L) 8.9 - 10.3 mg/dL   Total Protein 6.1 (L) 6.5 - 8.1 g/dL   Albumin 3.1 (L) 3.5 - 5.0 g/dL   AST 18 15 - 41 U/L   ALT 13 (L) 14 - 54 U/L   Alkaline Phosphatase 80 38 - 126 U/L   Total Bilirubin 0.1 (L) 0.3 - 1.2 mg/dL   GFR calc non Af Amer 3 (L) >60 mL/min   GFR calc Af Amer 3 (L) >60 mL/min    Comment: (NOTE) The eGFR has been calculated using the CKD EPI equation. This calculation has not been validated in all clinical situations. eGFR's persistently <60 mL/min signify possible Chronic Kidney Disease.    Anion gap 10 5 - 15     Lipid Panel     Component Value Date/Time   CHOL 136 02/12/2014 1139   TRIG 75 02/12/2014 1139   HDL 54 02/12/2014 1139   CHOLHDL 2.5 02/12/2014 1139   VLDL 15 02/12/2014 1139   LDLCALC 67 02/12/2014 1139     Lab Results  Component Value Date   HGBA1C 5.9 (H) 10/02/2015   HGBA1C 5.5 04/22/2015   HGBA1C 5.7 (H) 10/15/2014     Lab Results  Component Value Date   LDLCALC 67 02/12/2014   CREATININE 10.18 (H) Jan 29, 202018     HPI :  83 y.o.femalewho is being seen today for the evaluation of chest painat the request of Dr. Tyrell Antonio. The patient has a past medical history significant for ESRD on hemodialysis, M-W-F, recent declot of left arm fistula as an outpatient on 0/53, chronic diastolic heart failure, diabetes, COPD on home oxygen, hypertension, hyperlipidemia,stroke with right-sided weakness, SVT and gout.No history of MI or previous cardiac catheterizations. She presented to an event ED with chest pain, Troponins 0.67, 0.67, 0.46. Transferred to [facility] for cardiology  evaluation   HOSPITAL COURSE:   1-Chest pain; atypical most likely esophageal/abdominal in origin Patient presents with chest pain, resolved after nitroglycerin. Troponins 0.67, 0.67, 0.46. Mildly elevated with no rise.  -EKG without acute changes- T wave changes previously present -Chest pain suspected to be abdominal in nature or possible MSK or related to difficulty swallowing  and feeling that her food is getting stuck.  Cardiology has seen the patient and they do not recommend any further cardiac workup negative Lexiscan Myoview last year Barium esophagogram 4/30 to assess for difficulty swallowing ,previous need for esophageal stretching in 2012 by Dr Laural Golden MAY NEED GI consult In the outpatient setting. Esophagogram primarily shows severe esophageal dysmotility for which the patient has been started on diltiazem CD 180 mg a Also started on PPI Speech therapy has recommended Dysphagia 3 (Mech soft);Thin liquid, per speech therapy Patient would benefit from outpatient GI evaluation for repeat EGD, Dr Laural Golden     2-ESRD; HD M, W, F. She is due for HD Monday. Nephrologist following K mildly elevated. Received Kayexalate She missed HD on Wednesday due to clotted avg s/p declot 05/06/16.   3-HTN; resume home medications.   4-Hypothyroidism; continue with synthroid.   5. Abdominal discomfort-  Lipase ok , LFTs ok  Discontinued  Pepcid and started PPI   6. Diabetes type 2 -No longer on medications. Last A1c 5.9 on 10/02/2015     Discharge Exam:   Blood pressure (!) 173/59, pulse 69, temperature 98.1 F (36.7 C), temperature source Oral, resp. rate 18, height '5\' 4"'  (1.626 m), weight 87.9 kg (193 lb 12.8 oz), last menstrual period 12/24/2010, SpO2 100 %.  General exam: Appears calm and comfortable  Respiratory system: Clear to auscultation. Respiratory effort normal. Cardiovascular system: S1 & S2 heard, RRR. No JVD, murmurs, rubs, gallops or clicks. No pedal  edema. Gastrointestinal system: Abdomen is nondistended, soft and nontender. No organomegaly or masses felt. Normal bowel sounds heard. Central nervous system: Alert and oriented. No focal neurological deficits. Extremities: Symmetric 5 x 5 power. Skin: No rashes, lesions or ulcers Psychiatry: Judgement and insight appear normal. Mood & affect appropriate.        SignedReyne Dumas 07/06/2016, 12:35 PM        Time spent >45 mins

## 2016-05-11 DIAGNOSIS — N186 End stage renal disease: Secondary | ICD-10-CM | POA: Diagnosis not present

## 2016-05-11 DIAGNOSIS — Z992 Dependence on renal dialysis: Secondary | ICD-10-CM | POA: Diagnosis not present

## 2016-05-11 DIAGNOSIS — F0390 Unspecified dementia without behavioral disturbance: Secondary | ICD-10-CM | POA: Diagnosis not present

## 2016-05-11 NOTE — Consult Note (Signed)
           [facility] CM Primary Care Navigator  05/11/2016  [name] [birth year] [id number]   Went back to see patient in the roomtoday to identify possible discharge needs but she was alreadydischarged per staff.  Patient was discharged with home health services after dialysis yesterday.  Primary care provider's office called Salena Saner) to notify of patient's discharge and need for post hospital follow-up and transition of care. Reminded of health needs/ issues needing follow-up post discharge. Made aware to refer patient to [facility] care management ifdeemed appropriatefor services.  For questions, please contact:  Dannielle Huh, BSN, RN- [facility] Primary Care Navigator  Telephone: [phone] [place]

## 2016-05-12 DIAGNOSIS — Z992 Dependence on renal dialysis: Secondary | ICD-10-CM | POA: Diagnosis not present

## 2016-05-12 DIAGNOSIS — F0390 Unspecified dementia without behavioral disturbance: Secondary | ICD-10-CM | POA: Diagnosis not present

## 2016-05-12 DIAGNOSIS — N186 End stage renal disease: Secondary | ICD-10-CM | POA: Diagnosis not present

## 2016-05-13 DIAGNOSIS — F0390 Unspecified dementia without behavioral disturbance: Secondary | ICD-10-CM | POA: Diagnosis not present

## 2016-05-14 DIAGNOSIS — R269 Unspecified abnormalities of gait and mobility: Secondary | ICD-10-CM | POA: Diagnosis not present

## 2016-05-14 DIAGNOSIS — I509 Heart failure, unspecified: Secondary | ICD-10-CM | POA: Diagnosis not present

## 2016-05-14 DIAGNOSIS — Z992 Dependence on renal dialysis: Secondary | ICD-10-CM | POA: Diagnosis not present

## 2016-05-14 DIAGNOSIS — J9610 Chronic respiratory failure, unspecified whether with hypoxia or hypercapnia: Secondary | ICD-10-CM | POA: Diagnosis not present

## 2016-05-14 DIAGNOSIS — F0390 Unspecified dementia without behavioral disturbance: Secondary | ICD-10-CM | POA: Diagnosis not present

## 2016-05-14 DIAGNOSIS — R2681 Unsteadiness on feet: Secondary | ICD-10-CM | POA: Diagnosis not present

## 2016-05-14 DIAGNOSIS — N186 End stage renal disease: Secondary | ICD-10-CM | POA: Diagnosis not present

## 2016-05-14 DIAGNOSIS — R296 Repeated falls: Secondary | ICD-10-CM | POA: Diagnosis not present

## 2016-05-14 DIAGNOSIS — R0602 Shortness of breath: Secondary | ICD-10-CM | POA: Diagnosis not present

## 2016-05-14 DIAGNOSIS — I5032 Chronic diastolic (congestive) heart failure: Secondary | ICD-10-CM | POA: Diagnosis not present

## 2016-05-14 DIAGNOSIS — J449 Chronic obstructive pulmonary disease, unspecified: Secondary | ICD-10-CM | POA: Diagnosis not present

## 2016-05-15 DIAGNOSIS — F0390 Unspecified dementia without behavioral disturbance: Secondary | ICD-10-CM | POA: Diagnosis not present

## 2016-05-16 DIAGNOSIS — F0390 Unspecified dementia without behavioral disturbance: Secondary | ICD-10-CM | POA: Diagnosis not present

## 2016-05-17 DIAGNOSIS — N186 End stage renal disease: Secondary | ICD-10-CM | POA: Diagnosis not present

## 2016-05-17 DIAGNOSIS — Z992 Dependence on renal dialysis: Secondary | ICD-10-CM | POA: Diagnosis not present

## 2016-05-17 DIAGNOSIS — F0390 Unspecified dementia without behavioral disturbance: Secondary | ICD-10-CM | POA: Diagnosis not present

## 2016-05-18 ENCOUNTER — Ambulatory Visit (INDEPENDENT_AMBULATORY_CARE_PROVIDER_SITE_OTHER): Payer: Medicare HMO | Admitting: Internal Medicine

## 2016-05-18 ENCOUNTER — Encounter (INDEPENDENT_AMBULATORY_CARE_PROVIDER_SITE_OTHER): Payer: Self-pay | Admitting: Internal Medicine

## 2016-05-18 VITALS — BP 180/80 | HR 64 | Temp 98.3°F | Ht 66.0 in | Wt 188.7 lb

## 2016-05-18 DIAGNOSIS — R1310 Dysphagia, unspecified: Secondary | ICD-10-CM | POA: Diagnosis not present

## 2016-05-18 DIAGNOSIS — F0390 Unspecified dementia without behavioral disturbance: Secondary | ICD-10-CM | POA: Diagnosis not present

## 2016-05-18 DIAGNOSIS — R1319 Other dysphagia: Secondary | ICD-10-CM

## 2016-05-18 NOTE — Patient Instructions (Signed)
Will set up with Genene Churn.

## 2016-05-18 NOTE — Progress Notes (Signed)
Subjective:    Patient ID: [name], female    DOB: 1925-09-26, 83 y.o.   MRN: [id number]  HPI Referred by <C for dysphagia. Seen at [facility] 05/07/2016. Transferred to [facility].   C/o chest pain and dysphagia. DG Esophagram revealed severe esophageal dysmotility.  Resident of [facility] in [place].  She tells me she feels pretty good. She denies dysphagia this morning.  She does occasionally have heart burn. She has a BM about every day.   Hx of ESRD Dialysis M-W-F    2020/06/716 DG Esophagus: chest pain: dysphagia to solids:   IMPRESSION: Severe esophageal dysmotility.  No evidence for stricture.  Spontaneous gastroesophageal reflux to the midesophagus. Review of Systems Past Medical History:  Diagnosis Date  . Anemia   . Anemia in CKD (chronic kidney disease)   . Anxiety   . Arthritis    gout, right shoulder  . Cancer [facility])    ?stomach cancer   . Cataracts, both eyes   . CHF (congestive heart failure) ([place])   . CKD (chronic kidney disease), stage V ([place])    on HD on M/W/F  . Depression   . Diabetes mellitus, type 2 ([place])   . GERD (gastroesophageal reflux disease)   . Gynecologic cancer   . Hyperlipidemia   . Hypertension   . IBS (irritable bowel syndrome)    with costipation  . Intraoperative hemorrhage and hematoma of a digestive system organ or structure complicating a digestive system procedure   . Normal cardiac stress test 05/2015  . Overactive bladder   . Pneumonia    2 times in 2016  . SOB (shortness of breath)    nml spirometry 09/05/06  . Stroke [facility])    residual right side weakness and pain  . SVT (supraventricular tachycardia) ([place])   . Syncope and collapse   . Thyroid disease     Past Surgical History:  Procedure Laterality Date  . AV FISTULA PLACEMENT, BRACHIOCEPHALIC  37/10/6267   left arm   by Dr. Bridgett Larsson  . COLONOSCOPY    . FISTULA SUPERFICIALIZATION Left 06/10/2015   Procedure: LEFT UPPER ARM BRACHIOCEPHALIC FISTULA  PSEUDOANEURYSM PLICATION;  Surgeon: Conrad Verden, MD;  Location: [place];  Service: Vascular;  Laterality: Left;  . IR THROMBECTOMY AV FISTULA W/THROMBOLYSIS/PTA INC/SHUNT/IMG LEFT Left 05/06/2016  . IR US GUIDE VASC ACCESS LEFT  05/06/2016  . PERIPHERAL VASCULAR CATHETERIZATION Left 05/15/2015   Procedure: Fistulagram;  Surgeon: Conrad Nooksack, MD;  Location: [place] [facility];  Service: Cardiovascular;  Laterality: Left;  . TEE WITHOUT CARDIOVERSION  2011   2/2 Strep Bovis infection  . VESICOVAGINAL FISTULA CLOSURE W/ TAH      No Known Allergies  Current Outpatient Prescriptions on File Prior to Visit  Medication Sig Dispense Refill  . albuterol (PROVENTIL) (2.5 MG/3ML) 0.083% nebulizer solution USE 1 VIAL IN NEBULIZER 3 TIMES DAILY AS NEEDED FOR WHEEZING/SHORTNESS OF BREATH. 360 mL 0  . bisacodyl (DULCOLAX) 10 MG suppository 1 suppository rectally  Daily every 3 days as needed for constipation 12 suppository 2  . citalopram (CELEXA) 20 MG tablet TAKE (1) TABLET BY MOUTH ONCE IN THE MORNING. 31 tablet 11  . diltiazem (CARDIZEM CD) 180 MG 24 hr capsule Take 1 capsule (180 mg total) by mouth daily. 30 capsule 1  . guaiFENesin-dextromethorphan (ROBITUSSIN DM) 100-10 MG/5ML syrup TAKE ONE TEASPOONFUL BY MOUTH EVERY 4 HOURS AS NEEDED FOR COUGH 120 mL 2  . ipratropium (ATROVENT) 0.02 % nebulizer solution  USE 1 VIAL IN NEBULIZER 3 TIMES DAILY AS NEEDED FOR WHEEZING/SHORTNESS OF BREATH. 360 mL 0  . isosorbide mononitrate (IMDUR) 60 MG 24 hr tablet TAKE (1) TABLET BY MOUTH ONCE DAILY. 31 tablet 11  . levothyroxine (SYNTHROID, LEVOTHROID) 25 MCG tablet TAKE (1) TABLET BY MOUTH ONCE DAILY BEFORE BREAKFAST. 31 tablet 11  . lidocaine-prilocaine (EMLA) cream APPLY TOPICALLY TO LEFT UPPER ARM 1 HOUR PRIOR TO DIALYSIS ON MONDAY,WEDNESDAY & FRIDAYS AS NEEDED FOR PAIN. WRAP WITH PLASTIC WRAP AFTER A 30 g 11  . mirtazapine (REMERON) 15 MG tablet TAKE 1/2 TABLET (7.5MG ) BY MOUTH AT BEDTIME FOR MOOD/APPETITE. 16 tablet  11  . Multiple Vitamin (DAILY-VITE) TABS TAKE 1 TABLET BY MOUTH ONCE DAILY. (ON DAY OF DIALYSIS, TAKE AFTER DIALYSIS) 31 tablet 11  . nitroGLYCERIN (NITROSTAT) 0.4 MG SL tablet DISSOLVE 1 TABLET UNDER THE TONGUE EVERY 5 MINUTES X2 DOSES THEN TO ER 25 tablet 0  . OXYGEN Inhale 2 L into the lungs daily as needed (shortness of breath).    . pantoprazole (PROTONIX) 40 MG tablet Take 1 tablet (40 mg total) by mouth daily. 30 tablet 2  . polyethylene glycol powder (MIRALAX) powder Take 17 g by mouth daily. Hold for diarrhea 255 g 6  . polyvinyl alcohol (LIQUIFILM TEARS) 1.4 % ophthalmic solution 1 drop as needed for dry eyes.    . QC LO-DOSE ASPIRIN 81 MG EC tablet TAKE (1) TABLET BY MOUTH ONCE DAILY. 31 tablet 11  . SENSIPAR 30 MG tablet TAKE 1 TABLET BY MOUTH ONCE DAILY AFTER THE EVENING MEAL 31 tablet PRN  . sevelamer (RENVELA) 800 MG tablet Take 800-2,400 mg by mouth 5 (five) times daily. 2400mg  three times daily with meals and 800mg  twice daily with snacks    . STOOL SOFTENER 100 MG capsule TAKE 1 CAPSULE BY MOUTH TWICE DAILY. 62 capsule 11  . Travoprost, BAK Free, (TRAVATAN) 0.004 % SOLN ophthalmic solution Place 1 drop into both eyes at bedtime. 5 mL 11  . VENTOLIN HFA 108 (90 Base) MCG/ACT inhaler INHALE (2) PUFFS EVERY 4 HOURS AS NEEDED FOR WHEEZING OR SHORTNESS OFBREATH(COUGH). 18 g 11  . Lancets MISC Use as directed to monitor FSBS 1x daily x2 weeks, then 1x daily PRN S/Sx hypoglycemia. Dx: E11.9. 50 each 1   No current facility-administered medications on file prior to visit.        Objective:   Physical Exam Blood pressure (!) 180/80, pulse 64, temperature 98.3 F (36.8 C), height 5\' 6"  (1.676 m), weight 188 lb 11.2 oz (85.6 kg), last menstrual period 12/24/2010. Alert and oriented. Skin warm and dry. Oral mucosa is moist.   . Sclera anicteric, conjunctivae is pink. Thyroid not enlarged. No cervical lymphadenopathy. Lungs clear. Heart regular rate and rhythm.. Loud murmur heard. Abdomen  is soft. Bowel sounds are positive. No hepatomegaly. No abdominal masses felt. No tenderness.  No edema to lower extremities.          Assessment & Plan:  Dysphagia. Esiophagram revealed esophageal dysmotility. Will set up with Genene Churn. Continue the Protonix.

## 2016-05-19 DIAGNOSIS — F0390 Unspecified dementia without behavioral disturbance: Secondary | ICD-10-CM | POA: Diagnosis not present

## 2016-05-19 DIAGNOSIS — Z992 Dependence on renal dialysis: Secondary | ICD-10-CM | POA: Diagnosis not present

## 2016-05-19 DIAGNOSIS — N186 End stage renal disease: Secondary | ICD-10-CM | POA: Diagnosis not present

## 2016-05-20 DIAGNOSIS — F0390 Unspecified dementia without behavioral disturbance: Secondary | ICD-10-CM | POA: Diagnosis not present

## 2016-05-20 DIAGNOSIS — R32 Unspecified urinary incontinence: Secondary | ICD-10-CM | POA: Diagnosis not present

## 2016-05-20 DIAGNOSIS — R159 Full incontinence of feces: Secondary | ICD-10-CM | POA: Diagnosis not present

## 2016-05-21 DIAGNOSIS — Z992 Dependence on renal dialysis: Secondary | ICD-10-CM | POA: Diagnosis not present

## 2016-05-21 DIAGNOSIS — F0390 Unspecified dementia without behavioral disturbance: Secondary | ICD-10-CM | POA: Diagnosis not present

## 2016-05-21 DIAGNOSIS — N186 End stage renal disease: Secondary | ICD-10-CM | POA: Diagnosis not present

## 2016-05-22 DIAGNOSIS — F0390 Unspecified dementia without behavioral disturbance: Secondary | ICD-10-CM | POA: Diagnosis not present

## 2016-05-23 DIAGNOSIS — F0390 Unspecified dementia without behavioral disturbance: Secondary | ICD-10-CM | POA: Diagnosis not present

## 2016-05-24 DIAGNOSIS — N186 End stage renal disease: Secondary | ICD-10-CM | POA: Diagnosis not present

## 2016-05-24 DIAGNOSIS — F0390 Unspecified dementia without behavioral disturbance: Secondary | ICD-10-CM | POA: Diagnosis not present

## 2016-05-24 DIAGNOSIS — Z992 Dependence on renal dialysis: Secondary | ICD-10-CM | POA: Diagnosis not present

## 2016-05-25 ENCOUNTER — Inpatient Hospital Stay: Payer: Medicare HMO | Admitting: Family Medicine

## 2016-05-25 DIAGNOSIS — F0390 Unspecified dementia without behavioral disturbance: Secondary | ICD-10-CM | POA: Diagnosis not present

## 2016-05-25 DIAGNOSIS — H34812 Central retinal vein occlusion, left eye, with macular edema: Secondary | ICD-10-CM | POA: Diagnosis not present

## 2016-05-26 DIAGNOSIS — Z992 Dependence on renal dialysis: Secondary | ICD-10-CM | POA: Diagnosis not present

## 2016-05-26 DIAGNOSIS — N186 End stage renal disease: Secondary | ICD-10-CM | POA: Diagnosis not present

## 2016-05-26 DIAGNOSIS — F0390 Unspecified dementia without behavioral disturbance: Secondary | ICD-10-CM | POA: Diagnosis not present

## 2016-05-27 DIAGNOSIS — N186 End stage renal disease: Secondary | ICD-10-CM | POA: Diagnosis not present

## 2016-05-27 DIAGNOSIS — F0390 Unspecified dementia without behavioral disturbance: Secondary | ICD-10-CM | POA: Diagnosis not present

## 2016-05-27 DIAGNOSIS — Z992 Dependence on renal dialysis: Secondary | ICD-10-CM | POA: Diagnosis not present

## 2016-05-28 ENCOUNTER — Other Ambulatory Visit: Payer: Self-pay | Admitting: *Deleted

## 2016-05-28 DIAGNOSIS — F0390 Unspecified dementia without behavioral disturbance: Secondary | ICD-10-CM | POA: Diagnosis not present

## 2016-05-28 DIAGNOSIS — Z992 Dependence on renal dialysis: Secondary | ICD-10-CM | POA: Diagnosis not present

## 2016-05-28 DIAGNOSIS — N186 End stage renal disease: Secondary | ICD-10-CM | POA: Diagnosis not present

## 2016-05-28 MED ORDER — TRAMADOL HCL 50 MG PO TABS: 50.0000 mg | ORAL_TABLET | Freq: Two times a day (BID) | ORAL | 1 refills | Status: DC | PRN

## 2016-05-29 DIAGNOSIS — F0390 Unspecified dementia without behavioral disturbance: Secondary | ICD-10-CM | POA: Diagnosis not present

## 2016-05-30 DIAGNOSIS — F0390 Unspecified dementia without behavioral disturbance: Secondary | ICD-10-CM | POA: Diagnosis not present

## 2016-05-31 DIAGNOSIS — Z992 Dependence on renal dialysis: Secondary | ICD-10-CM | POA: Diagnosis not present

## 2016-05-31 DIAGNOSIS — F0390 Unspecified dementia without behavioral disturbance: Secondary | ICD-10-CM | POA: Diagnosis not present

## 2016-05-31 DIAGNOSIS — N186 End stage renal disease: Secondary | ICD-10-CM | POA: Diagnosis not present

## 2016-06-01 ENCOUNTER — Encounter: Payer: Self-pay | Admitting: Family Medicine

## 2016-06-01 ENCOUNTER — Ambulatory Visit (INDEPENDENT_AMBULATORY_CARE_PROVIDER_SITE_OTHER): Payer: Medicare HMO | Admitting: Family Medicine

## 2016-06-01 VITALS — BP 158/92 | HR 76 | Temp 98.9°F | Resp 18 | Ht 66.0 in | Wt 190.0 lb

## 2016-06-01 DIAGNOSIS — Z992 Dependence on renal dialysis: Secondary | ICD-10-CM

## 2016-06-01 DIAGNOSIS — J9611 Chronic respiratory failure with hypoxia: Secondary | ICD-10-CM

## 2016-06-01 DIAGNOSIS — N184 Chronic kidney disease, stage 4 (severe): Secondary | ICD-10-CM | POA: Diagnosis not present

## 2016-06-01 DIAGNOSIS — D631 Anemia in chronic kidney disease: Secondary | ICD-10-CM | POA: Diagnosis not present

## 2016-06-01 DIAGNOSIS — K219 Gastro-esophageal reflux disease without esophagitis: Secondary | ICD-10-CM

## 2016-06-01 DIAGNOSIS — E0821 Diabetes mellitus due to underlying condition with diabetic nephropathy: Secondary | ICD-10-CM

## 2016-06-01 DIAGNOSIS — N186 End stage renal disease: Secondary | ICD-10-CM | POA: Diagnosis not present

## 2016-06-01 DIAGNOSIS — F0390 Unspecified dementia without behavioral disturbance: Secondary | ICD-10-CM | POA: Diagnosis not present

## 2016-06-01 DIAGNOSIS — K224 Dyskinesia of esophagus: Secondary | ICD-10-CM

## 2016-06-01 NOTE — Progress Notes (Signed)
Subjective:    Patient ID: [name], female    DOB: 11-23-1925, 83 y.o.   MRN: [id number]  Patient presents for Hospital F/U (chest pain/ SOB)  Patient here for hospital follow-up. She was admitted to the hospital April 27 the 30th secondary to chest pain and shortness of breath. Her troponins were elevated and she was sent to [facility] for evaluation. Her chest pain didn't resolve after given nitroglycerin. Her EKG did not show any acute changes. After evaluation by cardiology her troponins tapered down. Her chest pain was thought to be possible abdominal musculoskeletal she had been having some difficulty swallowing. She was started on diltiazem secondary to esophageal dyspnea fertility. She was also started on proton pump inhibitor. She has had history of esophageal stricture in the past was seen by Dr. Melony Overly at that time. She did have barium esophagram done which showed the dysmotility while she was inpatient. Speech therapy recommended dysphasia 3 mechanical soft diet and thin liquids. -Recommend a follow-up with gastroenterology as an outpatient. She also complaining of hoarse voice therefore they recommended she follow-up with ear nose and throat as an outpatient  End-stage renal disease she was continued on her hemodialysis. She did have to get Kayexalate on 1 occurrence secondary to elevated potassium. She also missed hemodialysis and had a clot on 4/26 and had to be removed.  Hypertension regular blood pressure medicine otherwise was continued.  Hospital discharge shows need for repeat CBC and CMET  He is here today with her daughter and her son. She has no new concerns today. Reviewed hospital discharge information with them. They cannot tell any difference in her voice she feels like her voice is okay as well. Does felt better with the Protonix. She has not had any further chest pain. Daughter did ask about her oxygen was noted to be increased at times when she is  walking around she gets short of breath she currently wears 2 L   Review Of Systems:  GEN- denies fatigue, fever, weight loss,weakness, recent illness HEENT- denies eye drainage, change in vision, nasal discharge, CVS- denies chest pain, palpitations RESP- denies SOB, cough, wheeze ABD- denies N/V, change in stools, abd pain GU- denies dysuria, hematuria, dribbling, incontinence MSK- denies joint pain, muscle aches, injury Neuro- denies headache, dizziness, syncope, seizure activity       Objective:    BP (!) 158/92   Pulse 76   Temp 98.9 F (37.2 C) (Oral)   Resp 18   Ht 5\' 6"  (1.676 m)   Wt 190 lb (86.2 kg)   LMP 12/24/2010   SpO2 98% Comment: on 2L/mn via [place]  BMI 30.67 kg/m  GEN- NAD, alert and oriented x3,walks with walker , normal voice quality HEENT- PERRL, EOMI, non injected sclera, pink conjunctiva, MMM, oropharynx clear Neck- Supple,  CVS- RRR, 3/6 SEM RESP-CTAB ABD-NABS,soft,ND,NT EXT- No edema Pulses- Radial  2+       Assessment & Plan:      Problem List Items Addressed This Visit    GERD (gastroesophageal reflux disease)    Known GERD and with esophogeal dysmotility Continue the protonix/cardizem Change to dysphagia 3 diet, per speech recommendations  Discussion with patient as well as her daughter and son think there is more risk for sending her for further procedures at this time. We'll see how she does with the change in diet and the medication will hold off on EGD. With regards to the horse voice her voice and I think  to be any different over the past few months. We will hold off on ear nose and throat as well.      ESRD (end stage renal disease) on dialysis ([place])    HD tomororw, BP will adjust, no change to meds       Relevant Orders   Comprehensive metabolic panel (Completed)   Esophageal dysmotility   Diabetes mellitus with renal complications ([place]) - Primary    Diet controlled at this time Check A1C Reviewed all hospital records       Relevant Orders   CBC with Differential/Platelet (Completed)   Comprehensive metabolic panel (Completed)   Hemoglobin A1c (Completed)   Chronic respiratory failure (HCC) (Chronic)    Continues to benefit from oxygen therapy Agree to adjust up oxygen to 2.5-3L if a lot of exertion, this is when she is dropping      Anemia in chronic kidney disease    Chronic anemia, followed by renal typically between 8-9         Note: This dictation was prepared with Dragon dictation along with smaller phrase technology. Any transcriptional errors that result from this process are unintentional.

## 2016-06-01 NOTE — Patient Instructions (Addendum)
Continue current meds Ultram is her pain med  Okay to increase oxygen to 2.5-3Liters while exerting herself Diet Thin Liquids / Dysphagia 3 diet  F/U 3 months

## 2016-06-02 ENCOUNTER — Encounter: Payer: Self-pay | Admitting: Family Medicine

## 2016-06-02 DIAGNOSIS — Z992 Dependence on renal dialysis: Secondary | ICD-10-CM | POA: Diagnosis not present

## 2016-06-02 DIAGNOSIS — N186 End stage renal disease: Secondary | ICD-10-CM | POA: Diagnosis not present

## 2016-06-02 DIAGNOSIS — F0390 Unspecified dementia without behavioral disturbance: Secondary | ICD-10-CM | POA: Diagnosis not present

## 2016-06-02 LAB — CBC WITH DIFFERENTIAL/PLATELET
Basophils Absolute: 59 cells/uL (ref 0–200)
Basophils Relative: 1 %
EOS PCT: 0 %
Eosinophils Absolute: 0 cells/uL — ABNORMAL LOW (ref 15–500)
HCT: 28.4 % — ABNORMAL LOW (ref 35.0–45.0)
Hemoglobin: 8.7 g/dL — CL (ref 12.0–15.0)
LYMPHS PCT: 31 %
Lymphs Abs: 1829 cells/uL (ref 850–3900)
MCH: 25.1 pg — ABNORMAL LOW (ref 27.0–33.0)
MCHC: 30.6 g/dL — AB (ref 32.0–36.0)
MCV: 81.8 fL (ref 80.0–100.0)
MPV: 10 fL (ref 7.5–12.5)
Monocytes Absolute: 472 cells/uL (ref 200–950)
Monocytes Relative: 8 %
NEUTROS PCT: 60 %
Neutro Abs: 3540 cells/uL (ref 1500–7800)
PLATELETS: 183 10*3/uL (ref 140–400)
RBC: 3.47 MIL/uL — AB (ref 3.80–5.10)
RDW: 15.4 % — AB (ref 11.0–15.0)
WBC: 5.9 10*3/uL (ref 3.8–10.8)

## 2016-06-02 LAB — COMPREHENSIVE METABOLIC PANEL
ALT: 9 U/L (ref 6–29)
AST: 15 U/L (ref 10–35)
Albumin: 3.8 g/dL (ref 3.6–5.1)
Alkaline Phosphatase: 132 U/L — ABNORMAL HIGH (ref 33–130)
BILIRUBIN TOTAL: 0.4 mg/dL (ref 0.2–1.2)
BUN: 40 mg/dL — AB (ref 7–25)
CO2: 30 mmol/L (ref 20–31)
CREATININE: 6.94 mg/dL — AB (ref 0.60–0.88)
Calcium: 9.3 mg/dL (ref 8.6–10.4)
Chloride: 93 mmol/L — ABNORMAL LOW (ref 98–110)
GLUCOSE: 122 mg/dL — AB (ref 70–99)
Potassium: 4.5 mmol/L (ref 3.5–5.3)
SODIUM: 137 mmol/L (ref 135–146)
Total Protein: 6.5 g/dL (ref 6.1–8.1)

## 2016-06-02 LAB — HEMOGLOBIN A1C
Hgb A1c MFr Bld: 5.6 % (ref <5.7)
Mean Plasma Glucose: 114 mg/dL

## 2016-06-02 NOTE — Assessment & Plan Note (Signed)
Chronic anemia, followed by renal typically between 8-9

## 2016-06-02 NOTE — Assessment & Plan Note (Signed)
Diet controlled at this time Check A1C Reviewed all hospital records

## 2016-06-02 NOTE — Assessment & Plan Note (Signed)
Known GERD and with esophogeal dysmotility Continue the protonix/cardizem Change to dysphagia 3 diet, per speech recommendations  Discussion with patient as well as her daughter and son think there is more risk for sending her for further procedures at this time. We'll see how she does with the change in diet and the medication will hold off on EGD. With regards to the horse voice her voice and I think to be any different over the past few months. We will hold off on ear nose and throat as well.

## 2016-06-02 NOTE — Assessment & Plan Note (Signed)
HD tomororw, BP will adjust, no change to meds

## 2016-06-02 NOTE — Assessment & Plan Note (Signed)
Continues to benefit from oxygen therapy Agree to adjust up oxygen to 2.5-3L if a lot of exertion, this is when she is dropping

## 2016-06-03 DIAGNOSIS — F0390 Unspecified dementia without behavioral disturbance: Secondary | ICD-10-CM | POA: Diagnosis not present

## 2016-06-04 DIAGNOSIS — Z992 Dependence on renal dialysis: Secondary | ICD-10-CM | POA: Diagnosis not present

## 2016-06-04 DIAGNOSIS — F0390 Unspecified dementia without behavioral disturbance: Secondary | ICD-10-CM | POA: Diagnosis not present

## 2016-06-04 DIAGNOSIS — N186 End stage renal disease: Secondary | ICD-10-CM | POA: Diagnosis not present

## 2016-06-05 DIAGNOSIS — F0390 Unspecified dementia without behavioral disturbance: Secondary | ICD-10-CM | POA: Diagnosis not present

## 2016-06-06 DIAGNOSIS — F0390 Unspecified dementia without behavioral disturbance: Secondary | ICD-10-CM | POA: Diagnosis not present

## 2016-06-07 DIAGNOSIS — Z992 Dependence on renal dialysis: Secondary | ICD-10-CM | POA: Diagnosis not present

## 2016-06-07 DIAGNOSIS — F0390 Unspecified dementia without behavioral disturbance: Secondary | ICD-10-CM | POA: Diagnosis not present

## 2016-06-07 DIAGNOSIS — N186 End stage renal disease: Secondary | ICD-10-CM | POA: Diagnosis not present

## 2016-06-08 DIAGNOSIS — F0390 Unspecified dementia without behavioral disturbance: Secondary | ICD-10-CM | POA: Diagnosis not present

## 2016-06-09 DIAGNOSIS — N186 End stage renal disease: Secondary | ICD-10-CM | POA: Diagnosis not present

## 2016-06-09 DIAGNOSIS — F0390 Unspecified dementia without behavioral disturbance: Secondary | ICD-10-CM | POA: Diagnosis not present

## 2016-06-09 DIAGNOSIS — Z992 Dependence on renal dialysis: Secondary | ICD-10-CM | POA: Diagnosis not present

## 2016-06-10 DIAGNOSIS — F0390 Unspecified dementia without behavioral disturbance: Secondary | ICD-10-CM | POA: Diagnosis not present

## 2016-06-10 DIAGNOSIS — Z992 Dependence on renal dialysis: Secondary | ICD-10-CM | POA: Diagnosis not present

## 2016-06-10 DIAGNOSIS — N186 End stage renal disease: Secondary | ICD-10-CM | POA: Diagnosis not present

## 2016-06-11 DIAGNOSIS — I10 Essential (primary) hypertension: Secondary | ICD-10-CM | POA: Diagnosis not present

## 2016-06-11 DIAGNOSIS — E785 Hyperlipidemia, unspecified: Secondary | ICD-10-CM | POA: Diagnosis not present

## 2016-06-11 DIAGNOSIS — E119 Type 2 diabetes mellitus without complications: Secondary | ICD-10-CM | POA: Diagnosis not present

## 2016-06-11 DIAGNOSIS — Z992 Dependence on renal dialysis: Secondary | ICD-10-CM | POA: Diagnosis not present

## 2016-06-11 DIAGNOSIS — E039 Hypothyroidism, unspecified: Secondary | ICD-10-CM | POA: Diagnosis not present

## 2016-06-11 DIAGNOSIS — T82838A Hemorrhage of vascular prosthetic devices, implants and grafts, initial encounter: Secondary | ICD-10-CM | POA: Diagnosis not present

## 2016-06-11 DIAGNOSIS — R58 Hemorrhage, not elsewhere classified: Secondary | ICD-10-CM | POA: Diagnosis not present

## 2016-06-11 DIAGNOSIS — M1990 Unspecified osteoarthritis, unspecified site: Secondary | ICD-10-CM | POA: Diagnosis not present

## 2016-06-11 DIAGNOSIS — F329 Major depressive disorder, single episode, unspecified: Secondary | ICD-10-CM | POA: Diagnosis not present

## 2016-06-11 DIAGNOSIS — Z79899 Other long term (current) drug therapy: Secondary | ICD-10-CM | POA: Diagnosis not present

## 2016-06-11 DIAGNOSIS — N186 End stage renal disease: Secondary | ICD-10-CM | POA: Diagnosis not present

## 2016-06-11 DIAGNOSIS — F0390 Unspecified dementia without behavioral disturbance: Secondary | ICD-10-CM | POA: Diagnosis not present

## 2016-06-12 DIAGNOSIS — F0390 Unspecified dementia without behavioral disturbance: Secondary | ICD-10-CM | POA: Diagnosis not present

## 2016-06-13 DIAGNOSIS — F0390 Unspecified dementia without behavioral disturbance: Secondary | ICD-10-CM | POA: Diagnosis not present

## 2016-06-14 DIAGNOSIS — N186 End stage renal disease: Secondary | ICD-10-CM | POA: Diagnosis not present

## 2016-06-14 DIAGNOSIS — Z992 Dependence on renal dialysis: Secondary | ICD-10-CM | POA: Diagnosis not present

## 2016-06-14 DIAGNOSIS — J449 Chronic obstructive pulmonary disease, unspecified: Secondary | ICD-10-CM | POA: Diagnosis not present

## 2016-06-14 DIAGNOSIS — R296 Repeated falls: Secondary | ICD-10-CM | POA: Diagnosis not present

## 2016-06-14 DIAGNOSIS — I509 Heart failure, unspecified: Secondary | ICD-10-CM | POA: Diagnosis not present

## 2016-06-14 DIAGNOSIS — J9610 Chronic respiratory failure, unspecified whether with hypoxia or hypercapnia: Secondary | ICD-10-CM | POA: Diagnosis not present

## 2016-06-14 DIAGNOSIS — I5032 Chronic diastolic (congestive) heart failure: Secondary | ICD-10-CM | POA: Diagnosis not present

## 2016-06-14 DIAGNOSIS — F0390 Unspecified dementia without behavioral disturbance: Secondary | ICD-10-CM | POA: Diagnosis not present

## 2016-06-14 DIAGNOSIS — R2681 Unsteadiness on feet: Secondary | ICD-10-CM | POA: Diagnosis not present

## 2016-06-14 DIAGNOSIS — R0602 Shortness of breath: Secondary | ICD-10-CM | POA: Diagnosis not present

## 2016-06-14 DIAGNOSIS — R269 Unspecified abnormalities of gait and mobility: Secondary | ICD-10-CM | POA: Diagnosis not present

## 2016-06-15 DIAGNOSIS — F0390 Unspecified dementia without behavioral disturbance: Secondary | ICD-10-CM | POA: Diagnosis not present

## 2016-06-15 DIAGNOSIS — H3563 Retinal hemorrhage, bilateral: Secondary | ICD-10-CM | POA: Diagnosis not present

## 2016-06-15 DIAGNOSIS — H348122 Central retinal vein occlusion, left eye, stable: Secondary | ICD-10-CM | POA: Diagnosis not present

## 2016-06-15 DIAGNOSIS — H43813 Vitreous degeneration, bilateral: Secondary | ICD-10-CM | POA: Diagnosis not present

## 2016-06-15 DIAGNOSIS — E113293 Type 2 diabetes mellitus with mild nonproliferative diabetic retinopathy without macular edema, bilateral: Secondary | ICD-10-CM | POA: Diagnosis not present

## 2016-06-16 DIAGNOSIS — N186 End stage renal disease: Secondary | ICD-10-CM | POA: Diagnosis not present

## 2016-06-16 DIAGNOSIS — Z992 Dependence on renal dialysis: Secondary | ICD-10-CM | POA: Diagnosis not present

## 2016-06-16 DIAGNOSIS — F0390 Unspecified dementia without behavioral disturbance: Secondary | ICD-10-CM | POA: Diagnosis not present

## 2016-06-17 DIAGNOSIS — F0390 Unspecified dementia without behavioral disturbance: Secondary | ICD-10-CM | POA: Diagnosis not present

## 2016-06-18 DIAGNOSIS — F0390 Unspecified dementia without behavioral disturbance: Secondary | ICD-10-CM | POA: Diagnosis not present

## 2016-06-18 DIAGNOSIS — N186 End stage renal disease: Secondary | ICD-10-CM | POA: Diagnosis not present

## 2016-06-18 DIAGNOSIS — Z992 Dependence on renal dialysis: Secondary | ICD-10-CM | POA: Diagnosis not present

## 2016-06-19 DIAGNOSIS — F0390 Unspecified dementia without behavioral disturbance: Secondary | ICD-10-CM | POA: Diagnosis not present

## 2016-06-20 DIAGNOSIS — F0390 Unspecified dementia without behavioral disturbance: Secondary | ICD-10-CM | POA: Diagnosis not present

## 2016-06-21 DIAGNOSIS — N186 End stage renal disease: Secondary | ICD-10-CM | POA: Diagnosis not present

## 2016-06-21 DIAGNOSIS — Z992 Dependence on renal dialysis: Secondary | ICD-10-CM | POA: Diagnosis not present

## 2016-06-21 DIAGNOSIS — F0390 Unspecified dementia without behavioral disturbance: Secondary | ICD-10-CM | POA: Diagnosis not present

## 2016-06-22 DIAGNOSIS — F0390 Unspecified dementia without behavioral disturbance: Secondary | ICD-10-CM | POA: Diagnosis not present

## 2016-06-23 DIAGNOSIS — N186 End stage renal disease: Secondary | ICD-10-CM | POA: Diagnosis not present

## 2016-06-23 DIAGNOSIS — F0390 Unspecified dementia without behavioral disturbance: Secondary | ICD-10-CM | POA: Diagnosis not present

## 2016-06-23 DIAGNOSIS — Z992 Dependence on renal dialysis: Secondary | ICD-10-CM | POA: Diagnosis not present

## 2016-06-24 DIAGNOSIS — F0390 Unspecified dementia without behavioral disturbance: Secondary | ICD-10-CM | POA: Diagnosis not present

## 2016-06-25 ENCOUNTER — Other Ambulatory Visit: Payer: Self-pay | Admitting: Family Medicine

## 2016-06-25 DIAGNOSIS — Z992 Dependence on renal dialysis: Secondary | ICD-10-CM | POA: Diagnosis not present

## 2016-06-25 DIAGNOSIS — N186 End stage renal disease: Secondary | ICD-10-CM | POA: Diagnosis not present

## 2016-06-25 DIAGNOSIS — F0390 Unspecified dementia without behavioral disturbance: Secondary | ICD-10-CM | POA: Diagnosis not present

## 2016-06-26 DIAGNOSIS — Z992 Dependence on renal dialysis: Secondary | ICD-10-CM | POA: Diagnosis not present

## 2016-06-26 DIAGNOSIS — F0390 Unspecified dementia without behavioral disturbance: Secondary | ICD-10-CM | POA: Diagnosis not present

## 2016-06-26 DIAGNOSIS — N186 End stage renal disease: Secondary | ICD-10-CM | POA: Diagnosis not present

## 2016-06-27 DIAGNOSIS — F0390 Unspecified dementia without behavioral disturbance: Secondary | ICD-10-CM | POA: Diagnosis not present

## 2016-06-28 DIAGNOSIS — F0390 Unspecified dementia without behavioral disturbance: Secondary | ICD-10-CM | POA: Diagnosis not present

## 2016-06-28 DIAGNOSIS — N186 End stage renal disease: Secondary | ICD-10-CM | POA: Diagnosis not present

## 2016-06-28 DIAGNOSIS — Z992 Dependence on renal dialysis: Secondary | ICD-10-CM | POA: Diagnosis not present

## 2016-06-29 DIAGNOSIS — F0390 Unspecified dementia without behavioral disturbance: Secondary | ICD-10-CM | POA: Diagnosis not present

## 2016-06-30 DIAGNOSIS — F0390 Unspecified dementia without behavioral disturbance: Secondary | ICD-10-CM | POA: Diagnosis not present

## 2016-06-30 DIAGNOSIS — Z992 Dependence on renal dialysis: Secondary | ICD-10-CM | POA: Diagnosis not present

## 2016-06-30 DIAGNOSIS — N186 End stage renal disease: Secondary | ICD-10-CM | POA: Diagnosis not present

## 2016-07-01 DIAGNOSIS — F0390 Unspecified dementia without behavioral disturbance: Secondary | ICD-10-CM | POA: Diagnosis not present

## 2016-07-02 DIAGNOSIS — F0390 Unspecified dementia without behavioral disturbance: Secondary | ICD-10-CM | POA: Diagnosis not present

## 2016-07-02 DIAGNOSIS — Z992 Dependence on renal dialysis: Secondary | ICD-10-CM | POA: Diagnosis not present

## 2016-07-02 DIAGNOSIS — N186 End stage renal disease: Secondary | ICD-10-CM | POA: Diagnosis not present

## 2016-07-03 DIAGNOSIS — F0390 Unspecified dementia without behavioral disturbance: Secondary | ICD-10-CM | POA: Diagnosis not present

## 2016-07-04 DIAGNOSIS — F0390 Unspecified dementia without behavioral disturbance: Secondary | ICD-10-CM | POA: Diagnosis not present

## 2016-07-05 DIAGNOSIS — N186 End stage renal disease: Secondary | ICD-10-CM | POA: Diagnosis not present

## 2016-07-05 DIAGNOSIS — E785 Hyperlipidemia, unspecified: Secondary | ICD-10-CM | POA: Diagnosis not present

## 2016-07-05 DIAGNOSIS — Z79899 Other long term (current) drug therapy: Secondary | ICD-10-CM | POA: Diagnosis not present

## 2016-07-05 DIAGNOSIS — F0390 Unspecified dementia without behavioral disturbance: Secondary | ICD-10-CM | POA: Diagnosis not present

## 2016-07-05 DIAGNOSIS — E119 Type 2 diabetes mellitus without complications: Secondary | ICD-10-CM | POA: Diagnosis not present

## 2016-07-05 DIAGNOSIS — Z992 Dependence on renal dialysis: Secondary | ICD-10-CM | POA: Diagnosis not present

## 2016-07-06 DIAGNOSIS — F0390 Unspecified dementia without behavioral disturbance: Secondary | ICD-10-CM | POA: Diagnosis not present

## 2016-07-07 DIAGNOSIS — Z992 Dependence on renal dialysis: Secondary | ICD-10-CM | POA: Diagnosis not present

## 2016-07-07 DIAGNOSIS — F0390 Unspecified dementia without behavioral disturbance: Secondary | ICD-10-CM | POA: Diagnosis not present

## 2016-07-07 DIAGNOSIS — N186 End stage renal disease: Secondary | ICD-10-CM | POA: Diagnosis not present

## 2016-07-08 DIAGNOSIS — F0390 Unspecified dementia without behavioral disturbance: Secondary | ICD-10-CM | POA: Diagnosis not present

## 2016-07-09 DIAGNOSIS — Z992 Dependence on renal dialysis: Secondary | ICD-10-CM | POA: Diagnosis not present

## 2016-07-09 DIAGNOSIS — N186 End stage renal disease: Secondary | ICD-10-CM | POA: Diagnosis not present

## 2016-07-09 DIAGNOSIS — F0390 Unspecified dementia without behavioral disturbance: Secondary | ICD-10-CM | POA: Diagnosis not present

## 2016-07-10 DIAGNOSIS — Z992 Dependence on renal dialysis: Secondary | ICD-10-CM | POA: Diagnosis not present

## 2016-07-10 DIAGNOSIS — N186 End stage renal disease: Secondary | ICD-10-CM | POA: Diagnosis not present

## 2016-07-10 DIAGNOSIS — F0390 Unspecified dementia without behavioral disturbance: Secondary | ICD-10-CM | POA: Diagnosis not present

## 2016-07-11 DIAGNOSIS — Z992 Dependence on renal dialysis: Secondary | ICD-10-CM | POA: Diagnosis not present

## 2016-07-11 DIAGNOSIS — F0390 Unspecified dementia without behavioral disturbance: Secondary | ICD-10-CM | POA: Diagnosis not present

## 2016-07-11 DIAGNOSIS — N186 End stage renal disease: Secondary | ICD-10-CM | POA: Diagnosis not present

## 2016-07-12 DIAGNOSIS — F0390 Unspecified dementia without behavioral disturbance: Secondary | ICD-10-CM | POA: Diagnosis not present

## 2016-07-12 DIAGNOSIS — Z992 Dependence on renal dialysis: Secondary | ICD-10-CM | POA: Diagnosis not present

## 2016-07-12 DIAGNOSIS — N186 End stage renal disease: Secondary | ICD-10-CM | POA: Diagnosis not present

## 2016-07-13 DIAGNOSIS — F0390 Unspecified dementia without behavioral disturbance: Secondary | ICD-10-CM | POA: Diagnosis not present

## 2016-07-14 DIAGNOSIS — I10 Essential (primary) hypertension: Secondary | ICD-10-CM | POA: Diagnosis not present

## 2016-07-14 DIAGNOSIS — R0602 Shortness of breath: Secondary | ICD-10-CM | POA: Diagnosis not present

## 2016-07-14 DIAGNOSIS — Z992 Dependence on renal dialysis: Secondary | ICD-10-CM | POA: Diagnosis not present

## 2016-07-14 DIAGNOSIS — F0390 Unspecified dementia without behavioral disturbance: Secondary | ICD-10-CM | POA: Diagnosis not present

## 2016-07-14 DIAGNOSIS — R296 Repeated falls: Secondary | ICD-10-CM | POA: Diagnosis not present

## 2016-07-14 DIAGNOSIS — R269 Unspecified abnormalities of gait and mobility: Secondary | ICD-10-CM | POA: Diagnosis not present

## 2016-07-14 DIAGNOSIS — N186 End stage renal disease: Secondary | ICD-10-CM | POA: Diagnosis not present

## 2016-07-14 DIAGNOSIS — J9610 Chronic respiratory failure, unspecified whether with hypoxia or hypercapnia: Secondary | ICD-10-CM | POA: Diagnosis not present

## 2016-07-14 DIAGNOSIS — I509 Heart failure, unspecified: Secondary | ICD-10-CM | POA: Diagnosis not present

## 2016-07-14 DIAGNOSIS — I5032 Chronic diastolic (congestive) heart failure: Secondary | ICD-10-CM | POA: Diagnosis not present

## 2016-07-14 DIAGNOSIS — R2681 Unsteadiness on feet: Secondary | ICD-10-CM | POA: Diagnosis not present

## 2016-07-14 DIAGNOSIS — J449 Chronic obstructive pulmonary disease, unspecified: Secondary | ICD-10-CM | POA: Diagnosis not present

## 2016-07-15 DIAGNOSIS — F0390 Unspecified dementia without behavioral disturbance: Secondary | ICD-10-CM | POA: Diagnosis not present

## 2016-07-16 DIAGNOSIS — F0390 Unspecified dementia without behavioral disturbance: Secondary | ICD-10-CM | POA: Diagnosis not present

## 2016-07-16 DIAGNOSIS — Z992 Dependence on renal dialysis: Secondary | ICD-10-CM | POA: Diagnosis not present

## 2016-07-16 DIAGNOSIS — N186 End stage renal disease: Secondary | ICD-10-CM | POA: Diagnosis not present

## 2016-07-17 DIAGNOSIS — F0390 Unspecified dementia without behavioral disturbance: Secondary | ICD-10-CM | POA: Diagnosis not present

## 2016-07-18 DIAGNOSIS — F0390 Unspecified dementia without behavioral disturbance: Secondary | ICD-10-CM | POA: Diagnosis not present

## 2016-07-19 DIAGNOSIS — Z992 Dependence on renal dialysis: Secondary | ICD-10-CM | POA: Diagnosis not present

## 2016-07-19 DIAGNOSIS — N186 End stage renal disease: Secondary | ICD-10-CM | POA: Diagnosis not present

## 2016-07-19 DIAGNOSIS — F0390 Unspecified dementia without behavioral disturbance: Secondary | ICD-10-CM | POA: Diagnosis not present

## 2016-07-20 DIAGNOSIS — F0390 Unspecified dementia without behavioral disturbance: Secondary | ICD-10-CM | POA: Diagnosis not present

## 2016-07-21 ENCOUNTER — Telehealth: Payer: Self-pay | Admitting: *Deleted

## 2016-07-21 DIAGNOSIS — N186 End stage renal disease: Secondary | ICD-10-CM | POA: Diagnosis not present

## 2016-07-21 DIAGNOSIS — F0390 Unspecified dementia without behavioral disturbance: Secondary | ICD-10-CM | POA: Diagnosis not present

## 2016-07-21 DIAGNOSIS — E1051 Type 1 diabetes mellitus with diabetic peripheral angiopathy without gangrene: Secondary | ICD-10-CM | POA: Diagnosis not present

## 2016-07-21 DIAGNOSIS — Z992 Dependence on renal dialysis: Secondary | ICD-10-CM | POA: Diagnosis not present

## 2016-07-21 DIAGNOSIS — L84 Corns and callosities: Secondary | ICD-10-CM | POA: Diagnosis not present

## 2016-07-21 NOTE — Telephone Encounter (Signed)
Received call from patient daughter, Vidal Schwalbe.   Reports that patient is voicing c/o toothache. Requested order for treatment of toothache.   Patient is in ALF and will require order faxed to facility. [facility]- [phone]~telephone/ [phone] ~fax.  Please advise.

## 2016-07-21 NOTE — Telephone Encounter (Signed)
Explain to them that she would have to see a dentist to actually completely treat the problem. This medication may only provide temporary relief. Amoxicillin 500 mg 1 by mouth 3 times a day 7 days #21+0 Tramadol 50 mg 1-2 every 8 hours as needed for pain #40+0

## 2016-07-22 DIAGNOSIS — F0390 Unspecified dementia without behavioral disturbance: Secondary | ICD-10-CM | POA: Diagnosis not present

## 2016-07-22 MED ORDER — AMOXICILLIN 500 MG PO CAPS: 500.0000 mg | ORAL_CAPSULE | Freq: Three times a day (TID) | ORAL | 0 refills | Status: DC

## 2016-07-22 NOTE — Telephone Encounter (Signed)
Prescriptions sent to pharmacy.   Order faxed to facility.   Call placed to patient daughter Vidal Schwalbe and patient daughter made aware via VM.

## 2016-07-23 DIAGNOSIS — N186 End stage renal disease: Secondary | ICD-10-CM | POA: Diagnosis not present

## 2016-07-23 DIAGNOSIS — Z992 Dependence on renal dialysis: Secondary | ICD-10-CM | POA: Diagnosis not present

## 2016-07-23 DIAGNOSIS — F0390 Unspecified dementia without behavioral disturbance: Secondary | ICD-10-CM | POA: Diagnosis not present

## 2016-07-24 DIAGNOSIS — F0390 Unspecified dementia without behavioral disturbance: Secondary | ICD-10-CM | POA: Diagnosis not present

## 2016-07-25 DIAGNOSIS — F0390 Unspecified dementia without behavioral disturbance: Secondary | ICD-10-CM | POA: Diagnosis not present

## 2016-07-26 ENCOUNTER — Other Ambulatory Visit: Payer: Self-pay | Admitting: Family Medicine

## 2016-07-26 DIAGNOSIS — F0390 Unspecified dementia without behavioral disturbance: Secondary | ICD-10-CM | POA: Diagnosis not present

## 2016-07-26 DIAGNOSIS — Z992 Dependence on renal dialysis: Secondary | ICD-10-CM | POA: Diagnosis not present

## 2016-07-26 DIAGNOSIS — N186 End stage renal disease: Secondary | ICD-10-CM | POA: Diagnosis not present

## 2016-07-27 DIAGNOSIS — F0390 Unspecified dementia without behavioral disturbance: Secondary | ICD-10-CM | POA: Diagnosis not present

## 2016-07-28 DIAGNOSIS — Z992 Dependence on renal dialysis: Secondary | ICD-10-CM | POA: Diagnosis not present

## 2016-07-28 DIAGNOSIS — F0390 Unspecified dementia without behavioral disturbance: Secondary | ICD-10-CM | POA: Diagnosis not present

## 2016-07-28 DIAGNOSIS — N186 End stage renal disease: Secondary | ICD-10-CM | POA: Diagnosis not present

## 2016-07-29 DIAGNOSIS — F0390 Unspecified dementia without behavioral disturbance: Secondary | ICD-10-CM | POA: Diagnosis not present

## 2016-07-30 DIAGNOSIS — N186 End stage renal disease: Secondary | ICD-10-CM | POA: Diagnosis not present

## 2016-07-30 DIAGNOSIS — F0390 Unspecified dementia without behavioral disturbance: Secondary | ICD-10-CM | POA: Diagnosis not present

## 2016-07-30 DIAGNOSIS — Z992 Dependence on renal dialysis: Secondary | ICD-10-CM | POA: Diagnosis not present

## 2016-07-31 DIAGNOSIS — F0390 Unspecified dementia without behavioral disturbance: Secondary | ICD-10-CM | POA: Diagnosis not present

## 2016-08-01 DIAGNOSIS — F0390 Unspecified dementia without behavioral disturbance: Secondary | ICD-10-CM | POA: Diagnosis not present

## 2016-08-02 DIAGNOSIS — F0390 Unspecified dementia without behavioral disturbance: Secondary | ICD-10-CM | POA: Diagnosis not present

## 2016-08-02 DIAGNOSIS — N186 End stage renal disease: Secondary | ICD-10-CM | POA: Diagnosis not present

## 2016-08-02 DIAGNOSIS — Z992 Dependence on renal dialysis: Secondary | ICD-10-CM | POA: Diagnosis not present

## 2016-08-03 DIAGNOSIS — F0390 Unspecified dementia without behavioral disturbance: Secondary | ICD-10-CM | POA: Diagnosis not present

## 2016-08-04 DIAGNOSIS — F0390 Unspecified dementia without behavioral disturbance: Secondary | ICD-10-CM | POA: Diagnosis not present

## 2016-08-04 DIAGNOSIS — N186 End stage renal disease: Secondary | ICD-10-CM | POA: Diagnosis not present

## 2016-08-04 DIAGNOSIS — Z992 Dependence on renal dialysis: Secondary | ICD-10-CM | POA: Diagnosis not present

## 2016-08-05 DIAGNOSIS — F0390 Unspecified dementia without behavioral disturbance: Secondary | ICD-10-CM | POA: Diagnosis not present

## 2016-08-06 DIAGNOSIS — F0390 Unspecified dementia without behavioral disturbance: Secondary | ICD-10-CM | POA: Diagnosis not present

## 2016-08-06 DIAGNOSIS — N186 End stage renal disease: Secondary | ICD-10-CM | POA: Diagnosis not present

## 2016-08-06 DIAGNOSIS — Z992 Dependence on renal dialysis: Secondary | ICD-10-CM | POA: Diagnosis not present

## 2016-08-07 DIAGNOSIS — F0390 Unspecified dementia without behavioral disturbance: Secondary | ICD-10-CM | POA: Diagnosis not present

## 2016-08-08 DIAGNOSIS — F0390 Unspecified dementia without behavioral disturbance: Secondary | ICD-10-CM | POA: Diagnosis not present

## 2016-08-09 DIAGNOSIS — N186 End stage renal disease: Secondary | ICD-10-CM | POA: Diagnosis not present

## 2016-08-09 DIAGNOSIS — F0390 Unspecified dementia without behavioral disturbance: Secondary | ICD-10-CM | POA: Diagnosis not present

## 2016-08-09 DIAGNOSIS — Z992 Dependence on renal dialysis: Secondary | ICD-10-CM | POA: Diagnosis not present

## 2016-08-10 ENCOUNTER — Emergency Department (HOSPITAL_COMMUNITY): Payer: Medicare HMO

## 2016-08-10 ENCOUNTER — Ambulatory Visit (INDEPENDENT_AMBULATORY_CARE_PROVIDER_SITE_OTHER): Payer: Medicare HMO | Admitting: Family Medicine

## 2016-08-10 ENCOUNTER — Ambulatory Visit: Payer: Medicare HMO | Admitting: Family Medicine

## 2016-08-10 ENCOUNTER — Encounter (HOSPITAL_COMMUNITY): Payer: Self-pay | Admitting: Emergency Medicine

## 2016-08-10 ENCOUNTER — Inpatient Hospital Stay (HOSPITAL_COMMUNITY)
Admission: EM | Admit: 2016-08-10 | Discharge: 2016-08-14 | DRG: 291 | Disposition: A | Discharge status: Skilled Nursing Facility | Payer: Medicare HMO | Attending: Internal Medicine | Admitting: Internal Medicine

## 2016-08-10 ENCOUNTER — Encounter: Payer: Self-pay | Admitting: Family Medicine

## 2016-08-10 VITALS — BP 144/88 | HR 112 | Temp 98.8°F | Resp 24 | Ht 66.0 in | Wt 187.0 lb

## 2016-08-10 DIAGNOSIS — Z79899 Other long term (current) drug therapy: Secondary | ICD-10-CM | POA: Diagnosis not present

## 2016-08-10 DIAGNOSIS — R935 Abnormal findings on diagnostic imaging of other abdominal regions, including retroperitoneum: Secondary | ICD-10-CM | POA: Diagnosis not present

## 2016-08-10 DIAGNOSIS — I132 Hypertensive heart and chronic kidney disease with heart failure and with stage 5 chronic kidney disease, or end stage renal disease: Secondary | ICD-10-CM | POA: Diagnosis not present

## 2016-08-10 DIAGNOSIS — M109 Gout, unspecified: Secondary | ICD-10-CM | POA: Diagnosis present

## 2016-08-10 DIAGNOSIS — Z7982 Long term (current) use of aspirin: Secondary | ICD-10-CM

## 2016-08-10 DIAGNOSIS — R269 Unspecified abnormalities of gait and mobility: Secondary | ICD-10-CM | POA: Diagnosis not present

## 2016-08-10 DIAGNOSIS — K219 Gastro-esophageal reflux disease without esophagitis: Secondary | ICD-10-CM | POA: Diagnosis present

## 2016-08-10 DIAGNOSIS — I5033 Acute on chronic diastolic (congestive) heart failure: Secondary | ICD-10-CM | POA: Diagnosis present

## 2016-08-10 DIAGNOSIS — J9621 Acute and chronic respiratory failure with hypoxia: Secondary | ICD-10-CM | POA: Diagnosis not present

## 2016-08-10 DIAGNOSIS — I120 Hypertensive chronic kidney disease with stage 5 chronic kidney disease or end stage renal disease: Secondary | ICD-10-CM | POA: Diagnosis not present

## 2016-08-10 DIAGNOSIS — E039 Hypothyroidism, unspecified: Secondary | ICD-10-CM | POA: Diagnosis present

## 2016-08-10 DIAGNOSIS — F0390 Unspecified dementia without behavioral disturbance: Secondary | ICD-10-CM | POA: Diagnosis not present

## 2016-08-10 DIAGNOSIS — J9811 Atelectasis: Secondary | ICD-10-CM | POA: Diagnosis not present

## 2016-08-10 DIAGNOSIS — Z8673 Personal history of transient ischemic attack (TIA), and cerebral infarction without residual deficits: Secondary | ICD-10-CM | POA: Diagnosis not present

## 2016-08-10 DIAGNOSIS — I272 Pulmonary hypertension, unspecified: Secondary | ICD-10-CM | POA: Diagnosis present

## 2016-08-10 DIAGNOSIS — N186 End stage renal disease: Secondary | ICD-10-CM

## 2016-08-10 DIAGNOSIS — E1122 Type 2 diabetes mellitus with diabetic chronic kidney disease: Secondary | ICD-10-CM | POA: Diagnosis present

## 2016-08-10 DIAGNOSIS — I10 Essential (primary) hypertension: Secondary | ICD-10-CM

## 2016-08-10 DIAGNOSIS — J9620 Acute and chronic respiratory failure, unspecified whether with hypoxia or hypercapnia: Secondary | ICD-10-CM | POA: Diagnosis present

## 2016-08-10 DIAGNOSIS — H269 Unspecified cataract: Secondary | ICD-10-CM | POA: Diagnosis present

## 2016-08-10 DIAGNOSIS — J849 Interstitial pulmonary disease, unspecified: Secondary | ICD-10-CM | POA: Diagnosis not present

## 2016-08-10 DIAGNOSIS — G8929 Other chronic pain: Secondary | ICD-10-CM | POA: Diagnosis not present

## 2016-08-10 DIAGNOSIS — N2581 Secondary hyperparathyroidism of renal origin: Secondary | ICD-10-CM | POA: Diagnosis not present

## 2016-08-10 DIAGNOSIS — J441 Chronic obstructive pulmonary disease with (acute) exacerbation: Secondary | ICD-10-CM | POA: Diagnosis present

## 2016-08-10 DIAGNOSIS — Z992 Dependence on renal dialysis: Secondary | ICD-10-CM

## 2016-08-10 DIAGNOSIS — E785 Hyperlipidemia, unspecified: Secondary | ICD-10-CM | POA: Diagnosis present

## 2016-08-10 DIAGNOSIS — J449 Chronic obstructive pulmonary disease, unspecified: Secondary | ICD-10-CM

## 2016-08-10 DIAGNOSIS — I5032 Chronic diastolic (congestive) heart failure: Secondary | ICD-10-CM

## 2016-08-10 DIAGNOSIS — R296 Repeated falls: Secondary | ICD-10-CM | POA: Diagnosis not present

## 2016-08-10 DIAGNOSIS — N185 Chronic kidney disease, stage 5: Secondary | ICD-10-CM | POA: Diagnosis not present

## 2016-08-10 DIAGNOSIS — J9610 Chronic respiratory failure, unspecified whether with hypoxia or hypercapnia: Secondary | ICD-10-CM | POA: Diagnosis not present

## 2016-08-10 DIAGNOSIS — R0902 Hypoxemia: Secondary | ICD-10-CM | POA: Diagnosis not present

## 2016-08-10 DIAGNOSIS — J9601 Acute respiratory failure with hypoxia: Secondary | ICD-10-CM

## 2016-08-10 DIAGNOSIS — N189 Chronic kidney disease, unspecified: Secondary | ICD-10-CM

## 2016-08-10 DIAGNOSIS — R0602 Shortness of breath: Secondary | ICD-10-CM

## 2016-08-10 DIAGNOSIS — K8030 Calculus of bile duct with cholangitis, unspecified, without obstruction: Secondary | ICD-10-CM | POA: Diagnosis not present

## 2016-08-10 DIAGNOSIS — J9600 Acute respiratory failure, unspecified whether with hypoxia or hypercapnia: Secondary | ICD-10-CM | POA: Diagnosis not present

## 2016-08-10 DIAGNOSIS — K869 Disease of pancreas, unspecified: Secondary | ICD-10-CM | POA: Diagnosis not present

## 2016-08-10 DIAGNOSIS — Z85028 Personal history of other malignant neoplasm of stomach: Secondary | ICD-10-CM

## 2016-08-10 DIAGNOSIS — F329 Major depressive disorder, single episode, unspecified: Secondary | ICD-10-CM | POA: Diagnosis present

## 2016-08-10 DIAGNOSIS — K8020 Calculus of gallbladder without cholecystitis without obstruction: Secondary | ICD-10-CM | POA: Diagnosis not present

## 2016-08-10 DIAGNOSIS — D631 Anemia in chronic kidney disease: Secondary | ICD-10-CM | POA: Diagnosis present

## 2016-08-10 DIAGNOSIS — F419 Anxiety disorder, unspecified: Secondary | ICD-10-CM | POA: Diagnosis present

## 2016-08-10 DIAGNOSIS — R2681 Unsteadiness on feet: Secondary | ICD-10-CM | POA: Diagnosis not present

## 2016-08-10 DIAGNOSIS — I509 Heart failure, unspecified: Secondary | ICD-10-CM | POA: Diagnosis not present

## 2016-08-10 LAB — I-STAT TROPONIN, ED: TROPONIN I, POC: 0.06 ng/mL (ref 0.00–0.08)

## 2016-08-10 LAB — CBC
HCT: 27.8 % — ABNORMAL LOW (ref 36.0–46.0)
Hemoglobin: 8.9 g/dL — ABNORMAL LOW (ref 12.0–15.0)
MCH: 25.1 pg — ABNORMAL LOW (ref 26.0–34.0)
MCHC: 32 g/dL (ref 30.0–36.0)
MCV: 78.5 fL (ref 78.0–100.0)
Platelets: 166 10*3/uL (ref 150–400)
RBC: 3.54 MIL/uL — ABNORMAL LOW (ref 3.87–5.11)
RDW: 15.3 % (ref 11.5–15.5)
WBC: 6 10*3/uL (ref 4.0–10.5)

## 2016-08-10 LAB — BASIC METABOLIC PANEL
ANION GAP: 9 (ref 5–15)
BUN: 37 mg/dL — ABNORMAL HIGH (ref 6–20)
CHLORIDE: 95 mmol/L — AB (ref 101–111)
CO2: 31 mmol/L (ref 22–32)
Calcium: 8.8 mg/dL — ABNORMAL LOW (ref 8.9–10.3)
Creatinine, Ser: 6.96 mg/dL — ABNORMAL HIGH (ref 0.44–1.00)
GFR calc Af Amer: 5 mL/min — ABNORMAL LOW (ref >60)
GFR, EST NON AFRICAN AMERICAN: 5 mL/min — AB (ref >60)
GLUCOSE: 140 mg/dL — AB (ref 65–99)
POTASSIUM: 4.7 mmol/L (ref 3.5–5.1)
SODIUM: 135 mmol/L (ref 135–145)

## 2016-08-10 LAB — BRAIN NATRIURETIC PEPTIDE: B NATRIURETIC PEPTIDE 5: 3081.1 pg/mL — AB (ref 0.0–100.0)

## 2016-08-10 LAB — I-STAT CG4 LACTIC ACID, ED: LACTIC ACID, VENOUS: 0.81 mmol/L (ref 0.5–1.9)

## 2016-08-10 MED ORDER — IPRATROPIUM-ALBUTEROL 0.5-2.5 (3) MG/3ML IN SOLN
3.0000 mL | Freq: Once | RESPIRATORY_TRACT | Status: AC
  Administered 2016-08-10: 3 mL via RESPIRATORY_TRACT

## 2016-08-10 NOTE — Addendum Note (Signed)
Addended by: Sheral Flow on: 08/10/2016 03:29 PM   Modules accepted: Orders

## 2016-08-10 NOTE — ED Provider Notes (Signed)
[facility] DEPT Provider Note   CSN: [id number] Arrival date & time: 08/10/16  1608     History   Chief Complaint No chief complaint on file.   HPI [name] is a 83 y.o. female.  HPI  83 y.o. female with a hx of Anemia, CHF, DM2, HTN ,HLD, ESRD M/W/F, presents to the Emergency Department today from PCP office due to acute respiratory failure with hypoxia. Pt has been short of breath with wheezing for the past week. No significant cough or fever. Attempted nebulizer at home as well as in office with minimal relief. Pt notes mild chest discomfort yesterday at rest, but has since resolved. No N/V. No diaphoresis. No cough or congestion. No URI symptoms. No fevers. Notes worsening symptoms of SOB with lying flat. Used 2 pillows to sleep. Unable to sleep last night due to shortness of breath. Saw PCP today and sent to ED for eval. Suspect CHF exacerbation. Pt with Dialysis M/W/F. Attended and completed sessions yesterday. Pt on chronic 2L [place]. Requiring 3L with O2 sat 100%. No other symptoms noted    Past Medical History:  Diagnosis Date  . Anemia   . Anemia in CKD (chronic kidney disease)   . Anxiety   . Arthritis    gout, right shoulder  . Cancer [facility])    ?stomach cancer   . Cataracts, both eyes   . CHF (congestive heart failure) ([place])   . CKD (chronic kidney disease), stage V ([place])    on HD on M/W/F  . Depression   . Diabetes mellitus, type 2 ([place])   . GERD (gastroesophageal reflux disease)   . Gynecologic cancer   . Hyperlipidemia   . Hypertension   . IBS (irritable bowel syndrome)    with costipation  . Intraoperative hemorrhage and hematoma of a digestive system organ or structure complicating a digestive system procedure   . Normal cardiac stress test 05/2015  . Overactive bladder   . Pneumonia    2 times in 2016  . SOB (shortness of breath)    nml spirometry 09/05/06  . Stroke [facility])    residual right side weakness and pain  . SVT (supraventricular  tachycardia) ([place])   . Syncope and collapse   . Thyroid disease     Patient Active Problem List   Diagnosis Date Noted  . Esophageal dysmotility 06/01/2016  . Dysphagia   . Chest pain 05/07/2016  . Chronic diastolic heart failure ([place]) 10/07/2015  . Fluid overload 10/02/2015  . Hyperglycemia 10/02/2015  . D-dimer, elevated   . Depression   . Pseudoaneurysm of arteriovenous dialysis fistula ([place]) 05/29/2015  . Traumatic hematoma of scalp 02/12/2014  . Decreased hearing of both ears 02/12/2014  . At high risk for falls 02/12/2014  . Leg swelling 09/18/2013  . OA (osteoarthritis) of knee 09/18/2013  . MDD (major depressive disorder) ([place]) 04/05/2013  . Mechanical complication of other vascular device, implant, and graft 11/14/2012  . Pain in limb-Left arm 07/11/2012  . Hypothyroidism 06/08/2012  . GERD (gastroesophageal reflux disease) 03/23/2012  . Gait instability 01/13/2012  . Neuropathy of hand 01/13/2012  . Breast mass, right 07/11/2011  . Hemorrhoid 03/14/2011  . ESRD (end stage renal disease) on dialysis ([place]) 02/12/2011  . Bradycardia 02/01/2011  . Dyspnea 01/17/2011  . Chronic respiratory failure ([place]) 01/13/2011  . Constipation 12/09/2010  . Insomnia 10/14/2010  . Diabetes mellitus with renal complications ([place]) 79/89/2119  . ALLERGIC RHINITIS 04/03/2008  . Benign hypertensive heart and kidney disease  and CKD stage V ([place]) 08/07/2007  . DEPENDENT EDEMA, LEGS, BILATERAL 11/17/2006  . HYPERLIPIDEMIA 10/10/2006  . Anemia in chronic kidney disease 10/10/2006  . ANXIETY 09/05/2006  . CATARACT NOS 09/05/2006  . IBS 09/05/2006  . ARTHRITIS 09/05/2006    Past Surgical History:  Procedure Laterality Date  . AV FISTULA PLACEMENT, BRACHIOCEPHALIC  76/16/0737   left arm   by Dr. Bridgett Larsson  . COLONOSCOPY    . FISTULA SUPERFICIALIZATION Left 06/10/2015   Procedure: LEFT UPPER ARM BRACHIOCEPHALIC FISTULA PSEUDOANEURYSM PLICATION;  Surgeon: Conrad Bloomfield, MD;  Location: [facility];   Service: Vascular;  Laterality: Left;  . IR THROMBECTOMY AV FISTULA W/THROMBOLYSIS/PTA INC/SHUNT/IMG LEFT Left 05/06/2016  . IR US GUIDE VASC ACCESS LEFT  05/06/2016  . PERIPHERAL VASCULAR CATHETERIZATION Left 05/15/2015   Procedure: Fistulagram;  Surgeon: Conrad Opdyke, MD;  Location: [facility];  Service: Cardiovascular;  Laterality: Left;  . TEE WITHOUT CARDIOVERSION  2011   2/2 Strep Bovis infection  . VESICOVAGINAL FISTULA CLOSURE W/ TAH      OB History    No data available       Home Medications    Prior to Admission medications   Medication Sig Start Date End Date Taking? Authorizing Provider  albuterol (PROVENTIL) (2.5 MG/3ML) 0.083% nebulizer solution USE 1 VIAL IN NEBULIZER 3 TIMES DAILY AS NEEDED FOR WHEEZING/SHORTNESS OF BREATH. 10/01/15   Alycia Rossetti, MD  aspirin 81 MG chewable tablet Chew by mouth daily.    [provider]  bisacodyl (DULCOLAX) 10 MG suppository 1 suppository rectally  Daily every 3 days as needed for constipation 10/17/15   [place], Modena Nunnery, MD  citalopram (CELEXA) 20 MG tablet TAKE (1) TABLET BY MOUTH ONCE IN THE MORNING. 03/24/16   [place], Modena Nunnery, MD  diltiazem (CARDIZEM CD) 180 MG 24 hr capsule TAKE 1 CAPSULE BY MOUTH ONCE A DAY. 06/25/16   [place], Modena Nunnery, MD  guaiFENesin-dextromethorphan (ROBITUSSIN DM) 100-10 MG/5ML syrup TAKE ONE TEASPOONFUL BY MOUTH EVERY 4 HOURS AS NEEDED FOR COUGH 02/17/16   Vic Blackbird F, MD  ipratropium (ATROVENT) 0.02 % nebulizer solution USE 1 VIAL IN NEBULIZER 3 TIMES DAILY AS NEEDED FOR WHEEZING/SHORTNESS OF BREATH. 10/01/15   Alycia Rossetti, MD  isosorbide mononitrate (IMDUR) 60 MG 24 hr tablet TAKE (1) TABLET BY MOUTH ONCE DAILY. 03/24/16   Alycia Rossetti, MD  Lancets MISC Use as directed to monitor FSBS 1x daily x2 weeks, then 1x daily PRN S/Sx hypoglycemia. Dx: E11.9. 10/09/15   Alycia Rossetti, MD  levothyroxine (SYNTHROID, LEVOTHROID) 25 MCG tablet TAKE (1) TABLET BY MOUTH ONCE DAILY BEFORE  BREAKFAST. 03/24/16   [place], Modena Nunnery, MD  lidocaine-prilocaine (EMLA) cream APPLY TOPICALLY TO LEFT UPPER ARM 1 HOUR PRIOR TO DIALYSIS ON MONDAY,WEDNESDAY & FRIDAYS AS NEEDED FOR PAIN. WRAP WITH PLASTIC WRAP AFTER A 03/23/16   Alycia Rossetti, MD  mirtazapine (REMERON) 15 MG tablet TAKE 1/2 TABLET (7.5MG ) BY MOUTH AT BEDTIME FOR MOOD/APPETITE. 03/24/16   , Modena Nunnery, MD  Multiple Vitamin (DAILY-VITE) TABS TAKE 1 TABLET BY MOUTH ONCE DAILY. (ON DAY OF DIALYSIS, TAKE AFTER DIALYSIS) 03/24/16   Alycia Rossetti, MD  nitroGLYCERIN (NITROSTAT) 0.4 MG SL tablet DISSOLVE 1 TABLET UNDER THE TONGUE EVERY 5 MINUTES X2 DOSES THEN TO ER 06/06/15   Alycia Rossetti, MD  OXYGEN Inhale 2 L into the lungs daily as needed (shortness of breath).    [provider]  pantoprazole (PROTONIX) 40 MG tablet  TAKE (1) TABLET BY MOUTH ONCE DAILY. 07/26/16   [place], Modena Nunnery, MD  polyethylene glycol powder [facility]) powder Take 17 g by mouth daily. Hold for diarrhea 02/28/15   Alycia Rossetti, MD  polyvinyl alcohol (LIQUIFILM TEARS) 1.4 % ophthalmic solution 1 drop as needed for dry eyes.    [provider]  SENSIPAR 30 MG tablet TAKE 1 TABLET BY MOUTH ONCE DAILY AFTER THE EVENING MEAL 08/19/15   Alycia Rossetti, MD  sevelamer (RENVELA) 800 MG tablet Take 800-2,400 mg by mouth 5 (five) times daily. 2400mg  three times daily with meals and 800mg  twice daily with snacks    [provider]  STOOL SOFTENER 100 MG capsule TAKE 1 CAPSULE BY MOUTH TWICE DAILY. 03/24/16   [place], Modena Nunnery, MD  traMADol (ULTRAM) 50 MG tablet Take 1 tablet (50 mg total) by mouth every 12 (twelve) hours as needed. 05/28/16   [place], Modena Nunnery, MD  Travoprost, BAK Free, (TRAVATAN) 0.004 % SOLN ophthalmic solution Place 1 drop into both eyes at bedtime. 02/18/16   [place], Modena Nunnery, MD  VENTOLIN HFA 108 [id number] Base) MCG/ACT inhaler INHALE (2) PUFFS EVERY 4 HOURS AS NEEDED FOR WHEEZING OR SHORTNESS OFBREATH(COUGH). 03/25/16    Alycia Rossetti, MD    Family History Family History  Problem Relation Age of Onset  . Stroke Father   . Hypertension Sister   . Heart failure Sister   . Cancer Brother     Social History Social History  Substance Use Topics  . Smoking status: Never Smoker  . Smokeless tobacco: Never Used  . Alcohol use No     Allergies   Patient has no known allergies.   Review of Systems Review of Systems ROS reviewed and all are negative for acute change except as noted in the HPI.  Physical Exam Updated Vital Signs BP (!) 162/56   Pulse (!) 120   Temp 98.1 F (36.7 C) (Oral)   Resp 15   LMP 12/24/2010   SpO2 100%   Physical Exam  Constitutional: She is oriented to person, place, and time. Vital signs are normal. She appears well-developed and well-nourished. No distress.  HENT:  Head: Normocephalic and atraumatic.  Right Ear: Hearing, tympanic membrane, external ear and ear canal normal.  Left Ear: Hearing, tympanic membrane, external ear and ear canal normal.  Nose: Nose normal.  Mouth/Throat: Uvula is midline, oropharynx is clear and moist and mucous membranes are normal. No trismus in the jaw. No oropharyngeal exudate, posterior oropharyngeal erythema or tonsillar abscesses.  Eyes: Pupils are equal, round, and reactive to light. Conjunctivae and EOM are normal.  Neck: Normal range of motion. Neck supple. No tracheal deviation present.  Cardiovascular: Regular rhythm, S1 normal, S2 normal, normal heart sounds, intact distal pulses and normal pulses.  Tachycardia present.   Pulmonary/Chest: Effort normal. No respiratory distress. She has decreased breath sounds in the right lower field and the left lower field. She has no wheezes. She has no rhonchi. She has no rales.  Abdominal: Soft. Normal appearance and bowel sounds are normal. There is no tenderness.  Musculoskeletal: Normal range of motion.  Neurological: She is alert and oriented to person, place, and time.  Skin:  Skin is warm and dry.  Psychiatric: She has a normal mood and affect. Her speech is normal and behavior is normal. Thought content normal.  Nursing note and vitals reviewed.  ED Treatments / Results  Labs (all labs ordered are listed, but only abnormal results are  displayed) Labs Reviewed  BASIC METABOLIC PANEL - Abnormal; Notable for the following:       Result Value   Chloride 95 (*)    Glucose, Bld 140 (*)    BUN 37 (*)    Creatinine, Ser 6.96 (*)    Calcium 8.8 (*)    GFR calc non Af Amer 5 (*)    GFR calc Af Amer 5 (*)    All other components within normal limits  BRAIN NATRIURETIC PEPTIDE - Abnormal; Notable for the following:    B Natriuretic Peptide 3,081.1 (*)    All other components within normal limits  CBC - Abnormal; Notable for the following:    RBC 3.54 (*)    Hemoglobin 8.9 (*)    HCT 27.8 (*)    MCH 25.1 (*)    All other components within normal limits  I-STAT TROPONIN, ED  I-STAT CG4 LACTIC ACID, ED    EKG  EKG Interpretation  Date/Time:  Tuesday August 10 2016 16:48:49 EDT Ventricular Rate:  70 PR Interval:    QRS Duration: 87 QT Interval:  471 QTC Calculation: 509 R Axis:   3 Text Interpretation:  Sinus rhythm Prolonged PR interval Left ventricular hypertrophy Nonspecific T abnormalities, diffuse leads Prolonged QT interval t-wave abnormalities seen on previous EKG 05/07/2016 Confirmed by Brantley Stage [phone]) on 08/10/2016 4:53:51 PM       Radiology Dg Chest 2 View  Result Date: 08/10/2016 CLINICAL DATA:  Worsening shortness of breath and chest tightness for the past week. EXAM: CHEST  2 VIEW COMPARISON:  05/07/2016. FINDINGS: Stable elevated left hemidiaphragm. Mild left basilar atelectasis. Minimal right basilar atelectasis. Stable enlarged cardiac silhouette and tortuous and partially calcified thoracic aorta. Mild prominence of the interstitial markings without significant change. No pleural fluid seen. Diffuse osteopenia. Bilateral shoulder  degenerative changes and superior migration of the right humeral head. IMPRESSION: 1. Mild left basilar atelectasis and minimal right basilar atelectasis. 2. Stable cardiomegaly and mild chronic interstitial lung disease. 3. Bilateral shoulder degenerative changes and evidence of a large, chronic right rotator cuff tear. Electronically Signed   By: Claudie Revering M.D.   On: 08/10/2016 17:37   Ct Chest Wo Contrast  Result Date: 08/10/2016 CLINICAL DATA:  83 year old female with shortness of breath and wheezing. EXAM: CT CHEST WITHOUT CONTRAST TECHNIQUE: Multidetector CT imaging of the chest was performed following the standard protocol without IV contrast. COMPARISON:  Chest radiograph dated 08/10/2016 FINDINGS: Evaluation of this exam is limited in the absence of intravenous contrast. Cardiovascular: There is moderate cardiomegaly. No pericardial effusion. Multi vessel coronary vascular calcification with involvement of the LAD, RCA, and left circumflex artery. There is advanced atherosclerotic calcification of the thoracic aorta. No aneurysmal dilatation. Mild prominence of the central pulmonary vasculature, likely suggestive of underlying pulmonary hypertension. Mediastinum/Nodes: There is no hilar or mediastinal adenopathy. The esophagus is grossly unremarkable. Heterogeneous thyroid gland. Lungs/Pleura: There is eventration of the left hemidiaphragm. There is consolidative changes of the basal segment of the left lower lobe, likely atelectasis. Infiltrate is not entirely excluded. Clinical correlation is recommended. Right lung base subpleural thickening/ scarring. There is no pleural effusion or pneumothorax. The central airways are patent. Upper Abdomen: There is pneumobilia with air within the gallbladder. Multiple noncalcified stone noted in the gallbladder neck. There is a 4.7 x 3.0 cm lobulated low attenuating lesion in the left upper abdomen which is not well characterized. This lesion appears to arise  from the tail of the pancreas and demonstrates a  fluid density and may represent a pancreatic pseudocyst versus a cystic neoplasm. Further characterization with nonemergent MRI is recommended. There is bilateral renal atrophy. Musculoskeletal: There is a 4.3 x 5.7 cm solid-appearing mass in the right breast. Correlation with clinical exam and mammographic studies, as clinically indicated, recommended. Smaller partially calcified nodular densities noted adjacent to the larger mass. Multilevel degenerative changes of the spine. No acute osseous pathology. IMPRESSION: 1. Eventration of the left hemidiaphragm with left lung base consolidative changes, likely atelectasis. Pneumonia is not excluded. Clinical correlation is recommended. 2. Cardiomegaly with multi vessel coronary vascular calcification. 3.  Aortic Atherosclerosis (ICD10-I70.0). 4. Gallstones. There is pneumobilia, likely related to patulous ampulla of water. Clinical correlation is recommended. 5. Ovoid cystic appearing lesion in the tail of the pancreas may represent a pseudocyst or a cystic neoplasm. Further characterization with nonemergent MRI recommended. 6. Right breast mass. Correlation with clinical exam and mammographic studies, as clinically indicated, recommended. Electronically Signed   By: Anner Crete M.D.   On: 08/10/2016 22:21    Procedures Procedures (including critical care time)  Medications Ordered in ED Medications - No data to display  Initial Impression / Assessment and Plan / ED Course  I have reviewed the triage vital signs and the nursing notes.  Pertinent labs & imaging results that were available during my care of the patient were reviewed by me and considered in my medical decision making (see chart for details).  Final Clinical Impressions(s) / ED Diagnoses  {I have reviewed and evaluated the relevant laboratory values. {I have reviewed and evaluated the relevant imaging studies. {I have interpreted the  relevant EKG. {I have reviewed the relevant previous healthcare records.  {I obtained HPI from historian. {Patient discussed with supervising physician.  ED Course:  Assessment: Pt is a 83 y.o. female with a hx of Anemia, CHF, DM2, HTN ,HLD, ESRD M/W/F, presents to the Emergency Department today from PCP office due to acute respiratory failure with hypoxia. Pt has been short of breath with wheezing for the past week. No significant cough or fever. Attempted nebulizer at home as well as in office with minimal relief. Pt notes mild chest discomfort yesterday at rest, but has since resolved. No N/V. No diaphoresis. No cough or congestion. No URI symptoms. No fevers. Notes worsening symptoms of SOB with lying flat. Used 2 pillows to sleep. Unable to sleep last night due to shortness of breath. Saw PCP today and sent to ED for eval. Suspect CHF exacerbation. Pt with Dialysis M/W/F. Attended and completed sessions yesterday. Pt on chronic 2L [place]. Requiring 3L with O2 sat 100%. On exam, pt in NAD. Nontoxic/nonseptic appearing. VS with tachycardia. O2 sat 100% on 3L (Chronic 2L). Afebrile. Lungs decrease bases. Heart RRR. Abdomen nontender soft. Lactic unremarkable. CBC unremarkable. BMP baseline. Trop negative. EKG unremarkable. BNP 3,0,81.1. CXR without vascular congestion. Given hypoxia will order CT Angio for further evaluation. Seen by attending physician. Pt ambulated with O2 on home oxygen and desaturated to 75%. CT without contrast unremarkable. Plan is to Admit to medicine.   Disposition/Plan:  Admit Pt acknowledges and agrees with plan  Supervising Physician Fredia Sorrow, MD  Final diagnoses:  SOB (shortness of breath)  Hypoxia    New Prescriptions New Prescriptions   No medications on file     Shary Decamp, Hershal Coria 08/11/16 0019    Fredia Sorrow, MD 09/26/16 [phone]

## 2016-08-10 NOTE — ED Triage Notes (Signed)
Pt sent from doctor office for shortness of breath wheezing. Pt lives at [facility] in [place]. Wears 2 L  at all times.

## 2016-08-10 NOTE — ED Notes (Signed)
Pt verbalizing dissatisfaction of care and service here at [facility] ER today, states no one has updated her in hours and has not offered her any food since shes been here; RN explained that we try not to feed patients while they are still getting diagnostic testing; pt still upset

## 2016-08-10 NOTE — Progress Notes (Signed)
Attempt to place PIV for CT scan via ultrasound x 2 unsuccessful.  Poor veins.  Pt. Not wanting any more attempts.

## 2016-08-10 NOTE — Progress Notes (Signed)
   Subjective:    Patient ID: BAY JARQUIN, female    DOB: November 23, 1925, 83 y.o.   MRN: [id number]  Patient presents for Wheezing (x1 week- SOB, wheezing, denies cough, fever)  Patient here with her daughter. For the past week she's been short of breath and wheezing. She has not had any significant cough no fever. States that she did try her nebulizer couple times it didn't help so she stopped. Her oxygen has been on 2 L. She has been to dialysis and also told the nurses at her assisted living center about her breathing today they finally recommended that she come here to be seen. She has a very complex medical history including congestive heart failure She did have some mild chest pain yesterday but none today  She also had fall yesterday   Review Of Systems:  GEN- + fatigue, fever, weight loss,weakness, recent illness HEENT- denies eye drainage, change in vision, nasal discharge, CVS- denies chest pain, palpitations RESP- +SOB, denies cough, +wheeze ABD- denies N/V, change in stools, abd pain GU- denies dysuria, hematuria, dribbling, incontinence MSK- denies joint pain, muscle aches, injury Neuro- denies headache, dizziness, syncope, seizure activity       Objective:    BP (!) 144/88   Pulse (!) 112   Temp 98.8 F (37.1 C) (Oral)   Resp (!) 24   Ht 5\' 6"  (1.676 m)   Wt 187 lb (84.8 kg)   LMP 12/24/2010   SpO2 94%   BMI 30.18 kg/m  GEN- NAD, alert and oriented x3 HEENT- PERRL, EOMI, non injected sclera, pink conjunctiva, MMM, oropharynx clear Neck- Supple, no thyromegaly CVS- tachycardia, normal rhythem, 3/6 SEM  RESP-bilat wheeze, decrease air movement, increased WOB at rest  ABD-NABS,soft,NT,ND EXT- pedal edema Pulses- Radial, 2+  S/P Neb mild improvement bronchospasm, rhonchi, crackles lower bases , fair air movement      Assessment & Plan:      Problem List Items Addressed This Visit    ESRD (end stage renal disease) on dialysis ([place]) - Primary   Chronic  diastolic heart failure ([place])    Increased work of breathing shortness of breath progressing over the past week. I did give her a neb in the office this does have history of some chronic bronchitis but she had minimal improvement with the nebulizer. I think that she is in acute on chronic heart failure and is getting some hypoxia with minimal exertion. She also sustained a fall yesterday for weakness. With her age known comorbidities she is on dialysis she needs transport to the emergency room for further evaluation. Her daughter was at the bedside and notified and agrees with the plan. Right now she is on 2 L and satting about 9495% her blood pressure is stable heart rate is mildly elevated with her increased work of breathing.       Other Visit Diagnoses    Acute respiratory failure with hypoxia [facility])          Note: This dictation was prepared with Dragon dictation along with smaller phrase technology. Any transcriptional errors that result from this process are unintentional.

## 2016-08-10 NOTE — ED Notes (Signed)
Patient transported to CT 

## 2016-08-10 NOTE — ED Notes (Signed)
Pt ambulated to doorway of room on 3L nasal cannula. Resting sp02 100%. However while ambulating pts sp02 dropped to the mid 70's. On 3L nasal cannula. Pt visibly tired and wheezing by the time we reached the doorway.  Pt helped to return to bed. Pts Sp02 returned to normal after a few minutes of rest. Pt now in no apparent distress.

## 2016-08-10 NOTE — ED Notes (Signed)
PIV attempt x 2 unsuccessful.

## 2016-08-10 NOTE — ED Notes (Signed)
Patient transported to X-ray 

## 2016-08-10 NOTE — Patient Instructions (Signed)
Send to ER

## 2016-08-10 NOTE — Assessment & Plan Note (Signed)
Increased work of breathing shortness of breath progressing over the past week. I did give her a neb in the office this does have history of some chronic bronchitis but she had minimal improvement with the nebulizer. I think that she is in acute on chronic heart failure and is getting some hypoxia with minimal exertion. She also sustained a fall yesterday for weakness. With her age known comorbidities she is on dialysis she needs transport to the emergency room for further evaluation. Her daughter was at the bedside and notified and agrees with the plan. Right now she is on 2 L and satting about 9495% her blood pressure is stable heart rate is mildly elevated with her increased work of breathing.

## 2016-08-11 ENCOUNTER — Other Ambulatory Visit (HOSPITAL_COMMUNITY): Payer: Medicare HMO

## 2016-08-11 ENCOUNTER — Inpatient Hospital Stay (HOSPITAL_COMMUNITY): Payer: Medicare HMO

## 2016-08-11 ENCOUNTER — Encounter (HOSPITAL_COMMUNITY): Payer: Self-pay | Admitting: *Deleted

## 2016-08-11 DIAGNOSIS — N185 Chronic kidney disease, stage 5: Secondary | ICD-10-CM

## 2016-08-11 DIAGNOSIS — Z992 Dependence on renal dialysis: Secondary | ICD-10-CM | POA: Diagnosis not present

## 2016-08-11 DIAGNOSIS — J449 Chronic obstructive pulmonary disease, unspecified: Secondary | ICD-10-CM

## 2016-08-11 DIAGNOSIS — N186 End stage renal disease: Secondary | ICD-10-CM | POA: Diagnosis not present

## 2016-08-11 DIAGNOSIS — J9621 Acute and chronic respiratory failure with hypoxia: Secondary | ICD-10-CM

## 2016-08-11 DIAGNOSIS — R0902 Hypoxemia: Secondary | ICD-10-CM

## 2016-08-11 DIAGNOSIS — I5032 Chronic diastolic (congestive) heart failure: Secondary | ICD-10-CM

## 2016-08-11 DIAGNOSIS — D631 Anemia in chronic kidney disease: Secondary | ICD-10-CM

## 2016-08-11 DIAGNOSIS — I10 Essential (primary) hypertension: Secondary | ICD-10-CM

## 2016-08-11 DIAGNOSIS — J441 Chronic obstructive pulmonary disease with (acute) exacerbation: Secondary | ICD-10-CM

## 2016-08-11 DIAGNOSIS — G8929 Other chronic pain: Secondary | ICD-10-CM

## 2016-08-11 LAB — ECHOCARDIOGRAM COMPLETE
AO mean calculated velocity dopler: 162 cm/s
AV Area VTI index: 0.8 cm2/m2
AV Area VTI: 1.62 cm2
AV Area mean vel: 1.58 cm2
AV Mean grad: 12 mmHg
AV Peak grad: 22 mmHg
AV VEL mean LVOT/AV: 0.62
AV area mean vel ind: 0.79 cm2/m2
AV peak Index: 0.81
AV pk vel: 237 cm/s
AV vel: 1.6
Ao pk vel: 0.64 m/s
E decel time: 183 msec
FS: 24 % — AB (ref 28–44)
Height: 66 in
IVS/LV PW RATIO, ED: 1.22
LA ID, A-P, ES: 38 mm
LA diam end sys: 38 mm
LA diam index: 1.89 cm/m2
LA vol A4C: 89 ml
LA vol index: 45.9 mL/m2
LA vol: 92.3 mL
LV PW d: 11.6 mm — AB (ref 0.6–1.1)
LVOT SV: 86 mL
LVOT VTI: 33.9 cm
LVOT area: 2.54 cm2
LVOT diameter: 18 mm
LVOT peak VTI: 0.63 cm
LVOT peak grad rest: 9 mmHg
LVOT peak vel: 151 cm/s
MV Dec: 183
MV Peak grad: 6 mmHg
MV pk A vel: 132 m/s
MV pk E vel: 125 m/s
Reg peak vel: 333 cm/s
TAPSE: 18.3 mm
TR max vel: 333 cm/s
VTI: 53.8 cm
Valve area index: 0.8
Valve area: 1.6 cm2
Weight: 2980.62 oz

## 2016-08-11 LAB — BASIC METABOLIC PANEL
Anion gap: 9 (ref 5–15)
BUN: 42 mg/dL — ABNORMAL HIGH (ref 6–20)
CHLORIDE: 95 mmol/L — AB (ref 101–111)
CO2: 30 mmol/L (ref 22–32)
CREATININE: 7.66 mg/dL — AB (ref 0.44–1.00)
Calcium: 8.5 mg/dL — ABNORMAL LOW (ref 8.9–10.3)
GFR calc non Af Amer: 4 mL/min — ABNORMAL LOW (ref >60)
GFR, EST AFRICAN AMERICAN: 5 mL/min — AB (ref >60)
Glucose, Bld: 146 mg/dL — ABNORMAL HIGH (ref 65–99)
POTASSIUM: 5.3 mmol/L — AB (ref 3.5–5.1)
SODIUM: 134 mmol/L — AB (ref 135–145)

## 2016-08-11 LAB — CBC
HCT: 29.9 % — ABNORMAL LOW (ref 36.0–46.0)
HEMOGLOBIN: 9.4 g/dL — AB (ref 12.0–15.0)
MCH: 24.7 pg — ABNORMAL LOW (ref 26.0–34.0)
MCHC: 31.4 g/dL (ref 30.0–36.0)
MCV: 78.7 fL (ref 78.0–100.0)
Platelets: 172 10*3/uL (ref 150–400)
RBC: 3.8 MIL/uL — ABNORMAL LOW (ref 3.87–5.11)
RDW: 15.2 % (ref 11.5–15.5)
WBC: 5 10*3/uL (ref 4.0–10.5)

## 2016-08-11 LAB — MRSA PCR SCREENING: MRSA BY PCR: POSITIVE — AB

## 2016-08-11 LAB — PHOSPHORUS: Phosphorus: 3.7 mg/dL (ref 2.5–4.6)

## 2016-08-11 LAB — TROPONIN I
TROPONIN I: 0.05 ng/mL — AB (ref <0.03)
TROPONIN I: 0.04 ng/mL — AB (ref <0.03)
Troponin I: 0.03 ng/mL (ref <0.03)

## 2016-08-11 LAB — GLUCOSE, CAPILLARY
Glucose-Capillary: 160 mg/dL — ABNORMAL HIGH (ref 65–99)
Glucose-Capillary: 312 mg/dL — ABNORMAL HIGH (ref 65–99)
Glucose-Capillary: 215 mg/dL — ABNORMAL HIGH (ref 65–99)
Glucose-Capillary: 210 mg/dL — ABNORMAL HIGH (ref 65–99)

## 2016-08-11 LAB — PROCALCITONIN: Procalcitonin: 0.89 ng/mL

## 2016-08-11 LAB — MAGNESIUM: Magnesium: 2.2 mg/dL (ref 1.7–2.4)

## 2016-08-11 MED ORDER — DARBEPOETIN ALFA 150 MCG/0.3ML IJ SOSY: 150.0000 ug | PREFILLED_SYRINGE | INTRAMUSCULAR | Status: DC

## 2016-08-11 MED ORDER — MIRTAZAPINE 15 MG PO TABS
15.0000 mg | ORAL_TABLET | Freq: Every day | ORAL | Status: DC
  Administered 2016-08-11 – 2016-08-13 (×4): 15 mg via ORAL
  Filled 2016-08-11 (×4): qty 1

## 2016-08-11 MED ORDER — INSULIN ASPART 100 UNIT/ML ~~LOC~~ SOLN
0.0000 [IU] | Freq: Every day | SUBCUTANEOUS | Status: DC
  Administered 2016-08-11 – 2016-08-13 (×2): 2 [IU] via SUBCUTANEOUS

## 2016-08-11 MED ORDER — DARBEPOETIN ALFA 150 MCG/0.3ML IJ SOSY
150.0000 ug | PREFILLED_SYRINGE | INTRAMUSCULAR | Status: DC
  Administered 2016-08-12: 150 ug via INTRAVENOUS
  Filled 2016-08-11: qty 0.3

## 2016-08-11 MED ORDER — HEPARIN SODIUM (PORCINE) 1000 UNIT/ML DIALYSIS
20.0000 [IU]/kg | INTRAMUSCULAR | Status: DC | PRN
  Administered 2016-08-11: 1700 [IU] via INTRAVENOUS_CENTRAL

## 2016-08-11 MED ORDER — DILTIAZEM HCL ER COATED BEADS 180 MG PO CP24
180.0000 mg | ORAL_CAPSULE | Freq: Every day | ORAL | Status: DC
Start: 2016-08-11 — End: 2016-08-14
  Administered 2016-08-12 – 2016-08-14 (×3): 180 mg via ORAL
  Filled 2016-08-11 (×4): qty 1

## 2016-08-11 MED ORDER — IPRATROPIUM-ALBUTEROL 0.5-2.5 (3) MG/3ML IN SOLN
3.0000 mL | Freq: Three times a day (TID) | RESPIRATORY_TRACT | Status: DC
  Administered 2016-08-12: 3 mL via RESPIRATORY_TRACT
  Filled 2016-08-11: qty 3

## 2016-08-11 MED ORDER — HEPARIN SODIUM (PORCINE) 5000 UNIT/ML IJ SOLN
5000.0000 [IU] | Freq: Three times a day (TID) | INTRAMUSCULAR | Status: DC
  Administered 2016-08-11 – 2016-08-14 (×10): 5000 [IU] via SUBCUTANEOUS
  Filled 2016-08-11 (×10): qty 1

## 2016-08-11 MED ORDER — DOXYCYCLINE HYCLATE 100 MG IV SOLR
100.0000 mg | Freq: Two times a day (BID) | INTRAVENOUS | Status: DC
  Administered 2016-08-11 – 2016-08-12 (×4): 100 mg via INTRAVENOUS
  Filled 2016-08-11 (×4): qty 100

## 2016-08-11 MED ORDER — PENTAFLUOROPROP-TETRAFLUOROETH EX AERO: 1.0000 "application " | INHALATION_SPRAY | CUTANEOUS | Status: DC | PRN

## 2016-08-11 MED ORDER — CINACALCET HCL 30 MG PO TABS
30.0000 mg | ORAL_TABLET | Freq: Every day | ORAL | Status: DC
Start: 2016-08-11 — End: 2016-08-14
  Administered 2016-08-11 – 2016-08-13 (×4): 30 mg via ORAL
  Filled 2016-08-11 (×4): qty 1

## 2016-08-11 MED ORDER — ALTEPLASE 2 MG IJ SOLR: 2.0000 mg | Freq: Once | INTRAMUSCULAR | Status: DC | PRN

## 2016-08-11 MED ORDER — INSULIN ASPART 100 UNIT/ML ~~LOC~~ SOLN: 0.0000 [IU] | Freq: Three times a day (TID) | SUBCUTANEOUS | Status: DC

## 2016-08-11 MED ORDER — PANTOPRAZOLE SODIUM 40 MG PO TBEC
40.0000 mg | DELAYED_RELEASE_TABLET | Freq: Every day | ORAL | Status: DC
  Administered 2016-08-11 – 2016-08-14 (×4): 40 mg via ORAL
  Filled 2016-08-11 (×4): qty 1

## 2016-08-11 MED ORDER — SODIUM CHLORIDE 0.9 % IV SOLN: 100.0000 mL | INTRAVENOUS | Status: DC | PRN

## 2016-08-11 MED ORDER — MUPIROCIN 2 % EX OINT
1.0000 "application " | TOPICAL_OINTMENT | Freq: Two times a day (BID) | CUTANEOUS | Status: DC
  Administered 2016-08-11 – 2016-08-14 (×6): 1 via NASAL
  Filled 2016-08-11 (×4): qty 22

## 2016-08-11 MED ORDER — ACETAMINOPHEN 650 MG RE SUPP: 650.0000 mg | Freq: Four times a day (QID) | RECTAL | Status: DC | PRN

## 2016-08-11 MED ORDER — TRAMADOL HCL 50 MG PO TABS: 50.0000 mg | ORAL_TABLET | Freq: Two times a day (BID) | ORAL | Status: DC | PRN

## 2016-08-11 MED ORDER — ONDANSETRON HCL 4 MG/2ML IJ SOLN: 4.0000 mg | Freq: Four times a day (QID) | INTRAMUSCULAR | Status: DC | PRN

## 2016-08-11 MED ORDER — LEVOTHYROXINE SODIUM 25 MCG PO TABS
25.0000 ug | ORAL_TABLET | Freq: Every day | ORAL | Status: DC
  Administered 2016-08-11 – 2016-08-14 (×4): 25 ug via ORAL
  Filled 2016-08-11 (×4): qty 1

## 2016-08-11 MED ORDER — SODIUM CHLORIDE 0.9 % IV SOLN
100.0000 mL | INTRAVENOUS | Status: DC | PRN
Start: 2016-08-11 — End: 2016-08-13

## 2016-08-11 MED ORDER — IPRATROPIUM-ALBUTEROL 0.5-2.5 (3) MG/3ML IN SOLN
3.0000 mL | Freq: Four times a day (QID) | RESPIRATORY_TRACT | Status: DC
  Administered 2016-08-11 (×3): 3 mL via RESPIRATORY_TRACT
  Filled 2016-08-11 (×4): qty 3

## 2016-08-11 MED ORDER — SEVELAMER CARBONATE 800 MG PO TABS
2400.0000 mg | ORAL_TABLET | Freq: Three times a day (TID) | ORAL | Status: DC
  Administered 2016-08-11 – 2016-08-14 (×9): 2400 mg via ORAL
  Filled 2016-08-11 (×11): qty 3

## 2016-08-11 MED ORDER — ADULT MULTIVITAMIN W/MINERALS CH
1.0000 | ORAL_TABLET | Freq: Every day | ORAL | Status: DC
  Administered 2016-08-11 – 2016-08-14 (×4): 1 via ORAL
  Filled 2016-08-11 (×4): qty 1

## 2016-08-11 MED ORDER — ASPIRIN 81 MG PO CHEW
81.0000 mg | CHEWABLE_TABLET | Freq: Every day | ORAL | Status: DC
  Administered 2016-08-11 – 2016-08-14 (×4): 81 mg via ORAL
  Filled 2016-08-11 (×4): qty 1

## 2016-08-11 MED ORDER — LIDOCAINE HCL (PF) 1 % IJ SOLN: 5.0000 mL | INTRAMUSCULAR | Status: DC | PRN

## 2016-08-11 MED ORDER — NITROGLYCERIN 0.3 MG SL SUBL: 0.3000 mg | SUBLINGUAL_TABLET | SUBLINGUAL | Status: DC | PRN

## 2016-08-11 MED ORDER — HEPARIN SODIUM (PORCINE) 1000 UNIT/ML DIALYSIS: 1000.0000 [IU] | INTRAMUSCULAR | Status: DC | PRN

## 2016-08-11 MED ORDER — ACETAMINOPHEN 325 MG PO TABS: 650.0000 mg | ORAL_TABLET | Freq: Four times a day (QID) | ORAL | Status: DC | PRN

## 2016-08-11 MED ORDER — ONDANSETRON HCL 4 MG PO TABS: 4.0000 mg | ORAL_TABLET | Freq: Four times a day (QID) | ORAL | Status: DC | PRN

## 2016-08-11 MED ORDER — CHLORHEXIDINE GLUCONATE CLOTH 2 % EX PADS
6.0000 | MEDICATED_PAD | Freq: Every day | CUTANEOUS | Status: DC
  Administered 2016-08-12 – 2016-08-14 (×2): 6 via TOPICAL

## 2016-08-11 MED ORDER — ISOSORBIDE MONONITRATE ER 60 MG PO TB24
60.0000 mg | ORAL_TABLET | Freq: Every day | ORAL | Status: DC
  Administered 2016-08-12 – 2016-08-14 (×3): 60 mg via ORAL
  Filled 2016-08-11 (×4): qty 1

## 2016-08-11 MED ORDER — SEVELAMER CARBONATE 800 MG PO TABS
800.0000 mg | ORAL_TABLET | ORAL | Status: DC | PRN
Start: 2016-08-11 — End: 2016-08-14

## 2016-08-11 MED ORDER — POLYETHYLENE GLYCOL 3350 17 G PO PACK: 17.0000 g | PACK | ORAL | Status: DC | PRN

## 2016-08-11 MED ORDER — METHYLPREDNISOLONE SODIUM SUCC 125 MG IJ SOLR
60.0000 mg | Freq: Every day | INTRAMUSCULAR | Status: DC
  Administered 2016-08-11 – 2016-08-12 (×3): 60 mg via INTRAVENOUS
  Filled 2016-08-11 (×3): qty 2

## 2016-08-11 MED ORDER — DOXERCALCIFEROL 4 MCG/2ML IV SOLN
4.5000 ug | INTRAVENOUS | Status: DC
  Administered 2016-08-12 – 2016-08-13 (×2): 4.5 ug via INTRAVENOUS
  Filled 2016-08-11 (×2): qty 4

## 2016-08-11 MED ORDER — LATANOPROST 0.005 % OP SOLN
1.0000 [drp] | Freq: Every day | OPHTHALMIC | Status: DC
  Administered 2016-08-11 – 2016-08-13 (×4): 1 [drp] via OPHTHALMIC
  Filled 2016-08-11 (×2): qty 2.5

## 2016-08-11 MED ORDER — LIDOCAINE-PRILOCAINE 2.5-2.5 % EX CREA: 1.0000 "application " | TOPICAL_CREAM | CUTANEOUS | Status: DC | PRN

## 2016-08-11 MED ORDER — CITALOPRAM HYDROBROMIDE 20 MG PO TABS
20.0000 mg | ORAL_TABLET | Freq: Every day | ORAL | Status: DC
  Administered 2016-08-11 – 2016-08-14 (×4): 20 mg via ORAL
  Filled 2016-08-11 (×4): qty 1

## 2016-08-11 MED ORDER — INSULIN ASPART 100 UNIT/ML ~~LOC~~ SOLN
0.0000 [IU] | Freq: Three times a day (TID) | SUBCUTANEOUS | Status: DC
  Administered 2016-08-11: 3 [IU] via SUBCUTANEOUS
  Administered 2016-08-11: 7 [IU] via SUBCUTANEOUS
  Administered 2016-08-12 (×2): 3 [IU] via SUBCUTANEOUS
  Administered 2016-08-12 – 2016-08-13 (×2): 2 [IU] via SUBCUTANEOUS
  Administered 2016-08-13 – 2016-08-14 (×2): 1 [IU] via SUBCUTANEOUS

## 2016-08-11 MED ORDER — HYDRALAZINE HCL 20 MG/ML IJ SOLN: 5.0000 mg | INTRAMUSCULAR | Status: DC | PRN

## 2016-08-11 NOTE — Progress Notes (Signed)
[facility]                                                                              Patient Demographics  [name], is a 83 y.o. female, DOB - December 15, 1925, [id number]  Admit date - 08/10/2016   Admitting Physician Waldemar Dickens, MD  Outpatient Primary MD for the patient is [facility], Modena Nunnery, MD  Outpatient specialists:   LOS - 1  days   Medical records reviewed and are as summarized below:    Chief Complaint  Patient presents with  . Shortness of Breath       Brief summary   Patient is a 83 year old female with hypothyroidism, SVT, history of CVA, hyponatremia, hypertension, GERD, diabetes, depression, ESRD MWF, CHF, stomach CA presented to ED from PCPs office due to acute respiratory failure with hypoxia, O2 sats in 70s on 2 L O2 via nasal cannula. Patient reported gradual worsening of shortness of breath, wheezing, occasional productive cough with whitish phlegm. Also reported intermittent chest pain at rest.   Assessment & Plan    Principal Problem:   Acute on chronic respiratory failure (HCC) With hypoxia likely due to COPD and CHF exacerbation in the setting of ESRD - Improving, O2 sats 99% on 3 L -Chest x-ray with mild left basilar atelectasis and minimal right basilar atelectasis, mild chronic interstitial lung disease  -CT chest showed atelectasis versus pneumonia of the left lung base  - Currently improving, continue scheduled duo nebs, IV Solu-Medrol, doxycycline  - will transition to oral prednisone in a.m. with taper  - Hemodialysis per nephrology for volume management  - 2-D echo ordered as patient possibly could have underlying CHF   Active Problems:    ESRD (end stage renal disease) on dialysis [facility]) - Nephrology consulted, dialysis MWF - Plan for dialysis today  Abnormal CT chest - Reviewed CT chest with the patient, gallstones, ovoid cystic appearing lesion in the tail of pancreas representing pseudocyst versus cystic  neoplasm 4.7 x3.0cm recommended nonemergent MRI abdomen, right breast mass 4.3cm x 5.7cm - Discussed in detail with the patient regarding the findings on the CT chest. She had a normal mammogram in 11/2013 - Patient requested MRI abdomen. Unable to obtain mammogram in the hospital settings or further workup of the breast mass, patient will discuss with her PCP. - Ordered MRI of the abdomen for the characterize lesion of the cystic pancreatic lesion, which will also reflect more about the gallstones as well.  - Will send message to patient's PCP to workup right breast mass with breast ultrasound and biopsy outpatient    Anemia in chronic kidney disease - On ESA as outpatient, hemoglobin 9.4, follow closely     Essential hypertension - Currently stable, continue Imdur, Cardizem    Chronic pain -Currently stable, continue tramadol    Code Status: full CODE STATUS  DVT Prophylaxis:   SCD's Family Communication: Discussed in detail with the patient, all imaging results, lab results explained to the patient   Disposition Plan:   Time Spent in minutes   25 minutes  Procedures:  CT chest   Consultants:   Nephrology  Antimicrobials  doxycycline    Medications  Scheduled Meds: . aspirin  81 mg Oral Daily  . cinacalcet  30 mg Oral Q supper  . citalopram  20 mg Oral Daily  . darbepoetin (ARANESP) injection - DIALYSIS  150 mcg Intravenous Q Wed-HD  . diltiazem  180 mg Oral Daily  . doxercalciferol  4.5 mcg Intravenous Q M,W,F-HD  . heparin  5,000 Units Subcutaneous Q8H  . insulin aspart  0-5 Units Subcutaneous QHS  . insulin aspart  0-9 Units Subcutaneous TID WC  . ipratropium-albuterol  3 mL Nebulization Q6H  . isosorbide mononitrate  60 mg Oral Daily  . latanoprost  1 drop Both Eyes QHS  . levothyroxine  25 mcg Oral QAC breakfast  . methylPREDNISolone (SOLU-MEDROL) injection  60 mg Intravenous Daily  . mirtazapine  15 mg Oral QHS  . multivitamin with minerals  1 tablet Oral  Daily  . pantoprazole  40 mg Oral Daily  . sevelamer carbonate  2,400 mg Oral TID WC   Continuous Infusions: . doxycycline (VIBRAMYCIN) IV 100 mg (08/11/16 1250)   PRN Meds:.acetaminophen **OR** acetaminophen, hydrALAZINE, nitroGLYCERIN, ondansetron **OR** ondansetron (ZOFRAN) IV, polyethylene glycol, sevelamer carbonate, traMADol   Antibiotics   Anti-infectives    Start     Dose/Rate Route Frequency Ordered Stop   08/11/16 0045  doxycycline (VIBRAMYCIN) 100 mg in dextrose 5 % 250 mL IVPB     100 mg 125 mL/hr over 120 Minutes Intravenous Every 12 hours 08/11/16 0032          Subjective:   [name] was seen and examined today.  Still feeling somewhat short of breath, no chest pain. Wheezing is improving. No fevers or chills.  Patient denies dizziness, abdominal pain, N/V/D/C, new weakness, numbess, tingling. No acute events overnight.    Objective:   Vitals:   08/11/16 0359 08/11/16 0626 08/11/16 0755 08/11/16 0900  BP:  (!) 165/55  (!) 162/64  Pulse:  99 98 94  Resp:  18 16 18   Temp:  97.8 F (36.6 C)  98 F (36.7 C)  TempSrc:  Oral  Oral  SpO2: 98% 99% 99% 100%  Weight:      Height:        Intake/Output Summary (Last 24 hours) at 08/11/16 1352 Last data filed at 08/11/16 0900  Gross per 24 hour  Intake              610 ml  Output                0 ml  Net              610 ml     Wt Readings from Last 3 Encounters:  08/11/16 84.5 kg (186 lb 4.6 oz)  08/10/16 84.8 kg (187 lb)  06/01/16 86.2 kg (190 lb)     Exam  General: Alert and oriented x 3, NAD  Eyes: PERRLA, EOMI, Anicteric Sclera,  HEENT:  Atraumatic, normocephalic, normal oropharynx  Cardiovascular: S1 S2 auscultated, no rubs, murmurs or gallops. Regular rate and rhythm.  Respiratory: Decreased breath sounds at the bases   Gastrointestinal: Soft, nontender, nondistended, + bowel sounds  Ext: `1+ pedal edema bilaterally  Neuro: AAOx3, Cr N's II- XII. Strength 5/5 upper and lower  extremities bilaterally, speech clear, sensations grossly intact  Musculoskeletal: No digital cyanosis, clubbing  Skin: No rashes  Psych: Normal affect and demeanor, alert and oriented x3    Data Reviewed:  I have personally reviewed following labs and imaging studies  Micro Results Recent Results (from the past 240 hour(s))  MRSA PCR Screening     Status: Abnormal   Collection Time: 08/11/16 10:30 AM  Result Value Ref Range Status   MRSA by PCR POSITIVE (A) NEGATIVE Final    Comment:        The GeneXpert MRSA Assay (FDA approved for NASAL specimens only), is one component of a comprehensive MRSA colonization surveillance program. It is not intended to diagnose MRSA infection nor to guide or monitor treatment for MRSA infections. RESULT CALLED TO, READ BACK BY AND VERIFIED WITH: Marta Antu RN 12:10 08/11/16 ([email or web address])     Radiology Reports Dg Chest 2 View  Result Date: 08/10/2016 CLINICAL DATA:  Worsening shortness of breath and chest tightness for the past week. EXAM: CHEST  2 VIEW COMPARISON:  05/07/2016. FINDINGS: Stable elevated left hemidiaphragm. Mild left basilar atelectasis. Minimal right basilar atelectasis. Stable enlarged cardiac silhouette and tortuous and partially calcified thoracic aorta. Mild prominence of the interstitial markings without significant change. No pleural fluid seen. Diffuse osteopenia. Bilateral shoulder degenerative changes and superior migration of the right humeral head. IMPRESSION: 1. Mild left basilar atelectasis and minimal right basilar atelectasis. 2. Stable cardiomegaly and mild chronic interstitial lung disease. 3. Bilateral shoulder degenerative changes and evidence of a large, chronic right rotator cuff tear. Electronically Signed   By: Claudie Revering M.D.   On: 08/10/2016 17:37   Ct Chest Wo Contrast  Result Date: 08/10/2016 CLINICAL DATA:  83 year old female with shortness of breath and wheezing. EXAM: CT CHEST WITHOUT CONTRAST  TECHNIQUE: Multidetector CT imaging of the chest was performed following the standard protocol without IV contrast. COMPARISON:  Chest radiograph dated 08/10/2016 FINDINGS: Evaluation of this exam is limited in the absence of intravenous contrast. Cardiovascular: There is moderate cardiomegaly. No pericardial effusion. Multi vessel coronary vascular calcification with involvement of the LAD, RCA, and left circumflex artery. There is advanced atherosclerotic calcification of the thoracic aorta. No aneurysmal dilatation. Mild prominence of the central pulmonary vasculature, likely suggestive of underlying pulmonary hypertension. Mediastinum/Nodes: There is no hilar or mediastinal adenopathy. The esophagus is grossly unremarkable. Heterogeneous thyroid gland. Lungs/Pleura: There is eventration of the left hemidiaphragm. There is consolidative changes of the basal segment of the left lower lobe, likely atelectasis. Infiltrate is not entirely excluded. Clinical correlation is recommended. Right lung base subpleural thickening/ scarring. There is no pleural effusion or pneumothorax. The central airways are patent. Upper Abdomen: There is pneumobilia with air within the gallbladder. Multiple noncalcified stone noted in the gallbladder neck. There is a 4.7 x 3.0 cm lobulated low attenuating lesion in the left upper abdomen which is not well characterized. This lesion appears to arise from the tail of the pancreas and demonstrates a fluid density and may represent a pancreatic pseudocyst versus a cystic neoplasm. Further characterization with nonemergent MRI is recommended. There is bilateral renal atrophy. Musculoskeletal: There is a 4.3 x 5.7 cm solid-appearing mass in the right breast. Correlation with clinical exam and mammographic studies, as clinically indicated, recommended. Smaller partially calcified nodular densities noted adjacent to the larger mass. Multilevel degenerative changes of the spine. No acute osseous  pathology. IMPRESSION: 1. Eventration of the left hemidiaphragm with left lung base consolidative changes, likely atelectasis. Pneumonia is not excluded. Clinical correlation is recommended. 2. Cardiomegaly with multi vessel coronary vascular calcification. 3.  Aortic Atherosclerosis (ICD10-I70.0). 4. Gallstones. There is pneumobilia, likely related to patulous ampulla of water. Clinical correlation is recommended. 5. Ovoid cystic appearing lesion in the tail  of the pancreas may represent a pseudocyst or a cystic neoplasm. Further characterization with nonemergent MRI recommended. 6. Right breast mass. Correlation with clinical exam and mammographic studies, as clinically indicated, recommended. Electronically Signed   By: Anner Crete M.D.   On: 08/10/2016 22:21    Lab Data:  CBC:  Recent Labs Lab 08/10/16 1944 08/11/16 0608  WBC 6.0 5.0  HGB 8.9* 9.4*  HCT 27.8* 29.9*  MCV 78.5 78.7  PLT 166 825   Basic Metabolic Panel:  Recent Labs Lab 08/10/16 1751 08/11/16 0608  NA 135 134*  K 4.7 5.3*  CL 95* 95*  CO2 31 30  GLUCOSE 140* 146*  BUN 37* 42*  CREATININE 6.96* 7.66*  CALCIUM 8.8* 8.5*  MG  --  2.2  PHOS  --  3.7   GFR: Estimated Creatinine Clearance: 5.6 mL/min (A) (by C-G formula based on SCr of 7.66 mg/dL (H)). Liver Function Tests: No results for input(s): AST, ALT, ALKPHOS, BILITOT, PROT, ALBUMIN in the last 168 hours. No results for input(s): LIPASE, AMYLASE in the last 168 hours. No results for input(s): AMMONIA in the last 168 hours. Coagulation Profile: No results for input(s): INR, PROTIME in the last 168 hours. Cardiac Enzymes:  Recent Labs Lab 08/11/16 0153 08/11/16 0608 08/11/16 1202  TROPONINI 0.05* 0.04* 0.03*   BNP (last 3 results) No results for input(s): PROBNP in the last 8760 hours. HbA1C: No results for input(s): HGBA1C in the last 72 hours. CBG:  Recent Labs Lab 08/11/16 0807 08/11/16 1152  GLUCAP 160* 312*   Lipid Profile: No  results for input(s): CHOL, HDL, LDLCALC, TRIG, CHOLHDL, LDLDIRECT in the last 72 hours. Thyroid Function Tests: No results for input(s): TSH, T4TOTAL, FREET4, T3FREE, THYROIDAB in the last 72 hours. Anemia Panel: No results for input(s): VITAMINB12, FOLATE, FERRITIN, TIBC, IRON, RETICCTPCT in the last 72 hours. Urine analysis:    Component Value Date/Time   COLORURINE YELLOW 01/31/2011 2039   APPEARANCEUR CLEAR 01/31/2011 2039   LABSPEC 1.010 01/31/2011 2039   PHURINE 6.5 01/31/2011 2039   GLUCOSEU NEGATIVE 01/31/2011 2039   HGBUR NEGATIVE 01/31/2011 2039   HGBUR trace-lysed 09/05/2006 0000   BILIRUBINUR NEGATIVE 01/31/2011 2039   [place] NEGATIVE 01/31/2011 2039   PROTEINUR 30 (A) 01/31/2011 2039   UROBILINOGEN 0.2 01/31/2011 2039   NITRITE NEGATIVE 01/31/2011 2039   LEUKOCYTESUR NEGATIVE 01/31/2011 2039     Masaki Rothbauer M.D. [facility] 08/11/2016, 1:52 PM  Pager: [phone] Between 7am to 7pm - call Pager - [phone]  After 7pm go to [email or web address] - password [email or web address]  Call night coverage person covering after 7pm

## 2016-08-11 NOTE — Consult Note (Signed)
Reason for Consult: To manage dialysis and dialysis related needs Referring Physician: Maida Widger is an 83 y.o. female with past medical history significant for diabetes mellitus, hypertension, status post CVA, ESRD dialysis Monday Wednesday Friday at [facility]. She presented to the emergency department on 7/31 with complaints of shortness of breath, productive cough of white phlegm and orthopnea. She was treated with nebulizers without significant relief. She was found to be hypoxic so admitted to the hospital.  Chest x-ray showed atelectasis and increased interstitial markings. Currently with oxygen saturation 99-100 on 3 L nasal cannula. Today's patient's dialysis day. She has reportedly been compliant. I have her orders but don't have access to her records to know what if she has been getting to her dry weight of 84.5 kg.  This morning she was at her dry weight but with a higher blood pressure. BNP was over 3000   Dialyzes at [place] 84.5. 350 blood flow rate 3 and half hours 15-gauge needles HD Bath 2 potassium/2.5 calcium, Heparin yes. Access AV fistula on the left is on Epogen 6200 every HD, Hectorol 4.5 g every HD, Sensipar 30 daily and Renvela.  Past Medical History:  Diagnosis Date  . Anemia   . Anemia in CKD (chronic kidney disease)   . Anxiety   . Arthritis    gout, right shoulder  . Cancer [facility])    ?stomach cancer   . Cataracts, both eyes   . CHF (congestive heart failure) ([place])   . CKD (chronic kidney disease), stage V ([place])    on HD on M/W/F  . Depression   . Diabetes mellitus, type 2 ([place])   . GERD (gastroesophageal reflux disease)   . Gynecologic cancer   . Hyperlipidemia   . Hypertension   . IBS (irritable bowel syndrome)    with costipation  . Intraoperative hemorrhage and hematoma of a digestive system organ or structure complicating a digestive system procedure   . Normal cardiac stress test 05/2015  . Overactive bladder   . Pneumonia    2  times in 2016  . SOB (shortness of breath)    nml spirometry 09/05/06  . Stroke [facility])    residual right side weakness and pain  . SVT (supraventricular tachycardia) ([place])   . Syncope and collapse   . Thyroid disease     Past Surgical History:  Procedure Laterality Date  . AV FISTULA PLACEMENT, BRACHIOCEPHALIC  32/35/5732   left arm   by Dr. Bridgett Larsson  . COLONOSCOPY    . FISTULA SUPERFICIALIZATION Left 06/10/2015   Procedure: LEFT UPPER ARM BRACHIOCEPHALIC FISTULA PSEUDOANEURYSM PLICATION;  Surgeon: Conrad [place], MD;  Location: [place];  Service: Vascular;  Laterality: Left;  . IR THROMBECTOMY AV FISTULA W/THROMBOLYSIS/PTA INC/SHUNT/IMG LEFT Left 05/06/2016  . IR US GUIDE VASC ACCESS LEFT  05/06/2016  . PERIPHERAL VASCULAR CATHETERIZATION Left 05/15/2015   Procedure: Fistulagram;  Surgeon: Conrad Humbird, MD;  Location: [place] CV LAB;  Service: Cardiovascular;  Laterality: Left;  . TEE WITHOUT CARDIOVERSION  2011   2/2 Strep Bovis infection  . VESICOVAGINAL FISTULA CLOSURE W/ TAH      Family History  Problem Relation Age of Onset  . Stroke Father   . Hypertension Sister   . Heart failure Sister   . Cancer Brother     Social History:  reports that she has never smoked. She has never used smokeless tobacco. She reports that she does not drink alcohol or use  drugs.  Allergies: No Known Allergies  Medications: I have reviewed the patient's current medications.   Results for orders placed or performed during the hospital encounter of 08/10/16 (from the past 48 hour(s))  Basic metabolic panel     Status: Abnormal   Collection Time: 08/10/16  5:51 PM  Result Value Ref Range   Sodium 135 135 - 145 mmol/L   Potassium 4.7 3.5 - 5.1 mmol/L   Chloride 95 (L) 101 - 111 mmol/L   CO2 31 22 - 32 mmol/L   Glucose, Bld 140 (H) 65 - 99 mg/dL   BUN 37 (H) 6 - 20 mg/dL   Creatinine, Ser 6.96 (H) 0.44 - 1.00 mg/dL   Calcium 8.8 (L) 8.9 - 10.3 mg/dL   GFR calc non Af Amer 5 (L) >60 mL/min   GFR  calc Af Amer 5 (L) >60 mL/min    Comment: (NOTE) The eGFR has been calculated using the CKD EPI equation. This calculation has not been validated in all clinical situations. eGFR's persistently <60 mL/min signify possible Chronic Kidney Disease.    Anion gap 9 5 - 15  Brain natriuretic peptide     Status: Abnormal   Collection Time: 08/10/16  5:51 PM  Result Value Ref Range   B Natriuretic Peptide 3,081.1 (H) 0.0 - 100.0 pg/mL  I-Stat Troponin, ED (not at [facility])     Status: None   Collection Time: 08/10/16  6:03 PM  Result Value Ref Range   Troponin i, poc 0.06 0.00 - 0.08 ng/mL   Comment 3            Comment: Due to the release kinetics of cTnI, a negative result within the first hours of the onset of symptoms does not rule out myocardial infarction with certainty. If myocardial infarction is still suspected, repeat the test at appropriate intervals.   I-Stat CG4 Lactic Acid, ED     Status: None   Collection Time: 08/10/16  6:05 PM  Result Value Ref Range   Lactic Acid, Venous 0.81 0.5 - 1.9 mmol/L  CBC     Status: Abnormal   Collection Time: 08/10/16  7:44 PM  Result Value Ref Range   WBC 6.0 4.0 - 10.5 K/uL   RBC 3.54 (L) 3.87 - 5.11 MIL/uL   Hemoglobin 8.9 (L) 12.0 - 15.0 g/dL   HCT 27.8 (L) 36.0 - 46.0 %   MCV 78.5 78.0 - 100.0 fL   MCH 25.1 (L) 26.0 - 34.0 pg   MCHC 32.0 30.0 - 36.0 g/dL   RDW 15.3 11.5 - 15.5 %   Platelets 166 150 - 400 K/uL  Troponin I (q 6hr x 3)     Status: Abnormal   Collection Time: 08/11/16  1:53 AM  Result Value Ref Range   Troponin I 0.05 (HH) <0.03 ng/mL    Comment: CRITICAL RESULT CALLED TO, READ BACK BY AND VERIFIED WITH: MURPHY,N RN 08/11/2016 0237 JORDANS   Procalcitonin - Baseline     Status: None   Collection Time: 08/11/16  1:53 AM  Result Value Ref Range   Procalcitonin 0.89 ng/mL    Comment:        Interpretation: PCT > 0.5 ng/mL and <= 2 ng/mL: Systemic infection (sepsis) is possible, but other conditions are known to  elevate PCT as well. (NOTE)         ICU PCT Algorithm               Non ICU PCT Algorithm    ----------------------------     ------------------------------  PCT < 0.25 ng/mL                 PCT < 0.1 ng/mL     Stopping of antibiotics            Stopping of antibiotics       strongly encouraged.               strongly encouraged.    ----------------------------     ------------------------------       PCT level decrease by               PCT < 0.25 ng/mL       >= 80% from peak PCT       OR PCT 0.25 - 0.5 ng/mL          Stopping of antibiotics                                             encouraged.     Stopping of antibiotics           encouraged.    ----------------------------     ------------------------------       PCT level decrease by              PCT >= 0.25 ng/mL       < 80% from peak PCT        AND PCT >= 0.5 ng/mL             Continuing antibiotics                                              encouraged.       Continuing antibiotics            encouraged.    ----------------------------     ------------------------------     PCT level increase compared          PCT > 0.5 ng/mL         with peak PCT AND          PCT >= 0.5 ng/mL             Escalation of antibiotics                                          strongly encouraged.      Escalation of antibiotics        strongly encouraged.   Troponin I (q 6hr x 3)     Status: Abnormal   Collection Time: 08/11/16  6:08 AM  Result Value Ref Range   Troponin I 0.04 (HH) <0.03 ng/mL    Comment: CRITICAL VALUE NOTED.  VALUE IS CONSISTENT WITH PREVIOUSLY REPORTED AND CALLED VALUE.  Magnesium     Status: None   Collection Time: 08/11/16  6:08 AM  Result Value Ref Range   Magnesium 2.2 1.7 - 2.4 mg/dL  Phosphorus     Status: None   Collection Time: 08/11/16  6:08 AM  Result Value Ref Range   Phosphorus 3.7 2.5 - 4.6 mg/dL  CBC     Status: Abnormal   Collection Time: 08/11/16  6:08 AM  Result Value Ref Range   WBC 5.0  4.0 - 10.5 K/uL   RBC 3.80 (L) 3.87 - 5.11 MIL/uL   Hemoglobin 9.4 (L) 12.0 - 15.0 g/dL   HCT 29.9 (L) 36.0 - 46.0 %   MCV 78.7 78.0 - 100.0 fL   MCH 24.7 (L) 26.0 - 34.0 pg   MCHC 31.4 30.0 - 36.0 g/dL   RDW 15.2 11.5 - 15.5 %   Platelets 172 150 - 400 K/uL  Basic metabolic panel     Status: Abnormal   Collection Time: 08/11/16  6:08 AM  Result Value Ref Range   Sodium 134 (L) 135 - 145 mmol/L   Potassium 5.3 (H) 3.5 - 5.1 mmol/L   Chloride 95 (L) 101 - 111 mmol/L   CO2 30 22 - 32 mmol/L   Glucose, Bld 146 (H) 65 - 99 mg/dL   BUN 42 (H) 6 - 20 mg/dL   Creatinine, Ser 7.66 (H) 0.44 - 1.00 mg/dL   Calcium 8.5 (L) 8.9 - 10.3 mg/dL   GFR calc non Af Amer 4 (L) >60 mL/min   GFR calc Af Amer 5 (L) >60 mL/min    Comment: (NOTE) The eGFR has been calculated using the CKD EPI equation. This calculation has not been validated in all clinical situations. eGFR's persistently <60 mL/min signify possible Chronic Kidney Disease.    Anion gap 9 5 - 15  Glucose, capillary     Status: Abnormal   Collection Time: 08/11/16  8:07 AM  Result Value Ref Range   Glucose-Capillary 160 (H) 65 - 99 mg/dL  Glucose, capillary     Status: Abnormal   Collection Time: 08/11/16 11:52 AM  Result Value Ref Range   Glucose-Capillary 312 (H) 65 - 99 mg/dL    Dg Chest 2 View  Result Date: 08/10/2016 CLINICAL DATA:  Worsening shortness of breath and chest tightness for the past week. EXAM: CHEST  2 VIEW COMPARISON:  05/07/2016. FINDINGS: Stable elevated left hemidiaphragm. Mild left basilar atelectasis. Minimal right basilar atelectasis. Stable enlarged cardiac silhouette and tortuous and partially calcified thoracic aorta. Mild prominence of the interstitial markings without significant change. No pleural fluid seen. Diffuse osteopenia. Bilateral shoulder degenerative changes and superior migration of the right humeral head. IMPRESSION: 1. Mild left basilar atelectasis and minimal right basilar atelectasis. 2.  Stable cardiomegaly and mild chronic interstitial lung disease. 3. Bilateral shoulder degenerative changes and evidence of a large, chronic right rotator cuff tear. Electronically Signed   By: Claudie Revering M.D.   On: 08/10/2016 17:37   Ct Chest Wo Contrast  Result Date: 08/10/2016 CLINICAL DATA:  83 year old female with shortness of breath and wheezing. EXAM: CT CHEST WITHOUT CONTRAST TECHNIQUE: Multidetector CT imaging of the chest was performed following the standard protocol without IV contrast. COMPARISON:  Chest radiograph dated 08/10/2016 FINDINGS: Evaluation of this exam is limited in the absence of intravenous contrast. Cardiovascular: There is moderate cardiomegaly. No pericardial effusion. Multi vessel coronary vascular calcification with involvement of the LAD, RCA, and left circumflex artery. There is advanced atherosclerotic calcification of the thoracic aorta. No aneurysmal dilatation. Mild prominence of the central pulmonary vasculature, likely suggestive of underlying pulmonary hypertension. Mediastinum/Nodes: There is no hilar or mediastinal adenopathy. The esophagus is grossly unremarkable. Heterogeneous thyroid gland. Lungs/Pleura: There is eventration of the left hemidiaphragm. There is consolidative changes of the basal segment of the left lower lobe, likely atelectasis. Infiltrate is not entirely excluded. Clinical correlation is recommended. Right lung base subpleural thickening/ scarring. There  is no pleural effusion or pneumothorax. The central airways are patent. Upper Abdomen: There is pneumobilia with air within the gallbladder. Multiple noncalcified stone noted in the gallbladder neck. There is a 4.7 x 3.0 cm lobulated low attenuating lesion in the left upper abdomen which is not well characterized. This lesion appears to arise from the tail of the pancreas and demonstrates a fluid density and may represent a pancreatic pseudocyst versus a cystic neoplasm. Further characterization  with nonemergent MRI is recommended. There is bilateral renal atrophy. Musculoskeletal: There is a 4.3 x 5.7 cm solid-appearing mass in the right breast. Correlation with clinical exam and mammographic studies, as clinically indicated, recommended. Smaller partially calcified nodular densities noted adjacent to the larger mass. Multilevel degenerative changes of the spine. No acute osseous pathology. IMPRESSION: 1. Eventration of the left hemidiaphragm with left lung base consolidative changes, likely atelectasis. Pneumonia is not excluded. Clinical correlation is recommended. 2. Cardiomegaly with multi vessel coronary vascular calcification. 3.  Aortic Atherosclerosis (ICD10-I70.0). 4. Gallstones. There is pneumobilia, likely related to patulous ampulla of water. Clinical correlation is recommended. 5. Ovoid cystic appearing lesion in the tail of the pancreas may represent a pseudocyst or a cystic neoplasm. Further characterization with nonemergent MRI recommended. 6. Right breast mass. Correlation with clinical exam and mammographic studies, as clinically indicated, recommended. Electronically Signed   By: Anner Crete M.D.   On: 08/10/2016 22:21    ROS: positive for SOB, weakness  Blood pressure (!) 162/64, pulse 94, temperature 98 F (36.7 C), temperature source Oral, resp. rate 18, height _0  (1.676 m), weight 84.5 kg (186 lb 4.6 oz), last menstrual period 12/24/2010, SpO2 100 %. General appearance: alert and mild distress Eyes: conjunctivae/corneas clear. PERRL, EOM's intact. Fundi benign. Neck: no adenopathy, no carotid bruit, no JVD, supple, symmetrical, trachea midline and thyroid not enlarged, symmetric, no tenderness/mass/nodules Resp: diminished breath sounds bibasilar Cardio: regular rate and rhythm, S1, S2 normal, no murmur, click, rub or gallop GI: soft, non-tender; bowel sounds normal; no masses,  no organomegaly Extremities: edema 1 plus Left upper arm AV fistula with good thrill  and bruit  Assessment/Plan: 83 year old black female with ESRD. Presents with shortness of breath 1 shortness of breath- feel it could be multifactorial. She normally wears oxygen at baseline. Tells me that she thinks she has lost weight but her dry weight has not been decreased. Therefore, could have element of CHF. We will challenge fluid removal with dialysis today. She also appears to be more anemic than baseline and this could be contributing. 2 ESRD: Normally Monday Wednesday Friday at [facility]. Plan for dialysis today with volume challenge via AV fistula. 3 Hypertension: Blood pressure is up slightly. This is a good scenario for being able to pole fluid. She agrees to a temporary increase in dialysis time from 3 and half to 4 hours in an effort to remove more fluid.  On no blood pressure medications as an outpatient 4. Anemia of ESRD: Is on an ESA as outpatient. It seems however that her hemoglobin has dropped fairly acutely. We will increase ESA and monitor 5. Metabolic Bone Disease: Will continue home doses of Sensipar, Hectorol and also continue Renvela from phos binding   [place] 08/11/2016, 12:06 PM

## 2016-08-11 NOTE — H&P (Signed)
History and Physical    [name] XBM:[id number] DOB: 20-Feb-1925 DOA: 08/10/2016  PCP: Alycia Rossetti, MD Patient coming from: snf  Chief Complaint: SOB  HPI: [name] is a 83 y.o. female with medical history significant of hypothyroidism, SVT, CVA, IBS, hyponatremia, hypertension, GERD, diabetes, depression, C KD/ESRD on dialysis, CHF, stomach cancer .  Patient presents to [facility] after consent from PCPs office due to acute respiratory failure with noted hypoxia with O2 saturations in the 70s on her typical 2 L nasal cannula. Patient states gradual worsening of shortness of breath with associated wheezing and occasional productive cough with white phlegm over the last week. Nebulizer treatments without improvement. Denies fevers, worsening orthopnea (patient reports baseline 2 pillow orthopnea), dysuria, fatigue, palpitations, flank pain, etc., headache. Patient does endorse intermittent chest pain at rest. This is not anything to this better or worse. Episodes typically resolve without intervention. Patient reports complaints with her typical dialysis on Monday Wednesday and Friday.    ED Course: Objective findings outlined below.  Review of Systems: As per HPI otherwise all other systems reviewed and are negative  Ambulatory Status: Limited due to age and chronic O2 requirement.  Past Medical History:  Diagnosis Date  . Anemia   . Anemia in CKD (chronic kidney disease)   . Anxiety   . Arthritis    gout, right shoulder  . Cancer [facility])    ?stomach cancer   . Cataracts, both eyes   . CHF (congestive heart failure) ([place])   . CKD (chronic kidney disease), stage V ([place])    on HD on M/W/F  . Depression   . Diabetes mellitus, type 2 ([place])   . GERD (gastroesophageal reflux disease)   . Gynecologic cancer   . Hyperlipidemia   . Hypertension   . IBS (irritable bowel syndrome)    with costipation  . Intraoperative hemorrhage and hematoma of a digestive system  organ or structure complicating a digestive system procedure   . Normal cardiac stress test 05/2015  . Overactive bladder   . Pneumonia    2 times in 2016  . SOB (shortness of breath)    nml spirometry 09/05/06  . Stroke [facility])    residual right side weakness and pain  . SVT (supraventricular tachycardia) ([place])   . Syncope and collapse   . Thyroid disease     Past Surgical History:  Procedure Laterality Date  . AV FISTULA PLACEMENT, BRACHIOCEPHALIC  02/72/5366   left arm   by Dr. Bridgett Larsson  . COLONOSCOPY    . FISTULA SUPERFICIALIZATION Left 06/10/2015   Procedure: LEFT UPPER ARM BRACHIOCEPHALIC FISTULA PSEUDOANEURYSM PLICATION;  Surgeon: Conrad [place], MD;  Location: [place];  Service: Vascular;  Laterality: Left;  . IR THROMBECTOMY AV FISTULA W/THROMBOLYSIS/PTA INC/SHUNT/IMG LEFT Left 05/06/2016  . IR US GUIDE VASC ACCESS LEFT  05/06/2016  . PERIPHERAL VASCULAR CATHETERIZATION Left 05/15/2015   Procedure: Fistulagram;  Surgeon: Conrad [place], MD;  Location: [place] CV LAB;  Service: Cardiovascular;  Laterality: Left;  . TEE WITHOUT CARDIOVERSION  2011   2/2 Strep Bovis infection  . VESICOVAGINAL FISTULA CLOSURE W/ TAH      Social History   Social History  . Marital status: Widowed    Spouse name: N/A  . Number of children: N/A  . Years of education: N/A   Occupational History  . Not on file.   Social History Main Topics  . Smoking status: Never Smoker  . Smokeless tobacco: Never  Used  . Alcohol use No  . Drug use: No  . Sexual activity: No   Other Topics Concern  . Not on file   Social History Narrative   The patient is one of 8 children and she does not know much of the specifics of her family medical history. She has 10 children and so many grandchildren and great grandchildren that she can't count. She has been on dialysis and living at an assisted living center for the past 6 years.     No Known Allergies  Family History  Problem Relation Age of Onset  . Stroke  Father   . Hypertension Sister   . Heart failure Sister   . Cancer Brother       Prior to Admission medications   Medication Sig Start Date End Date Taking? Authorizing Provider  albuterol (PROVENTIL) (2.5 MG/3ML) 0.083% nebulizer solution USE 1 VIAL IN NEBULIZER 3 TIMES DAILY AS NEEDED FOR WHEEZING/SHORTNESS OF BREATH. 10/01/15  Yes [place], Modena Nunnery, MD  aspirin 81 MG chewable tablet Chew by mouth daily.   Yes [provider]  bisacodyl (DULCOLAX) 10 MG suppository 1 suppository rectally  Daily every 3 days as needed for constipation Patient taking differently: Place 10 mg rectally daily as needed.  10/17/15  Yes [place], Modena Nunnery, MD  citalopram (CELEXA) 20 MG tablet TAKE (1) TABLET BY MOUTH ONCE IN THE MORNING. 03/24/16  Yes [place], Modena Nunnery, MD  diltiazem (CARDIZEM CD) 180 MG 24 hr capsule TAKE 1 CAPSULE BY MOUTH ONCE A DAY. 06/25/16  Yes [place], Modena Nunnery, MD  docusate sodium (COLACE) 100 MG capsule Take 100 mg by mouth 2 (two) times daily.   Yes [provider]  guaiFENesin-dextromethorphan (ROBITUSSIN DM) 100-10 MG/5ML syrup TAKE ONE TEASPOONFUL BY MOUTH EVERY 4 HOURS AS NEEDED FOR COUGH 02/17/16  Yes [place], Lonell Grandchild F, MD  ipratropium (ATROVENT) 0.02 % nebulizer solution USE 1 VIAL IN NEBULIZER 3 TIMES DAILY AS NEEDED FOR WHEEZING/SHORTNESS OF BREATH. 10/01/15  Yes [place], Modena Nunnery, MD  isosorbide mononitrate (IMDUR) 60 MG 24 hr tablet TAKE (1) TABLET BY MOUTH ONCE DAILY. 03/24/16  Yes [place], Modena Nunnery, MD  levothyroxine (SYNTHROID, LEVOTHROID) 25 MCG tablet TAKE (1) TABLET BY MOUTH ONCE DAILY BEFORE BREAKFAST. 03/24/16  Yes [place], Modena Nunnery, MD  lidocaine-prilocaine (EMLA) cream APPLY TOPICALLY TO LEFT UPPER ARM 1 HOUR PRIOR TO DIALYSIS ON MONDAY,WEDNESDAY & FRIDAYS AS NEEDED FOR PAIN. WRAP WITH PLASTIC WRAP AFTER A 03/23/16  Yes [place], Modena Nunnery, MD  mirtazapine (REMERON) 15 MG tablet TAKE 1/2 TABLET (7.5MG ) BY MOUTH AT BEDTIME FOR MOOD/APPETITE. 03/24/16  Yes [place],  Modena Nunnery, MD  Multiple Vitamin (TAB-A-VITE) TABS Take 1 tablet by mouth daily.   Yes [provider]  nitroGLYCERIN (NITROSTAT) 0.4 MG SL tablet DISSOLVE 1 TABLET UNDER THE TONGUE EVERY 5 MINUTES X2 DOSES THEN TO ER 06/06/15  Yes [place], Modena Nunnery, MD  pantoprazole (PROTONIX) 40 MG tablet TAKE (1) TABLET BY MOUTH ONCE DAILY. 07/26/16  Yes [place], Modena Nunnery, MD  polyethylene glycol powder (MIRALAX) powder Take 17 g by mouth daily. Hold for diarrhea Patient taking differently: Take 17 g by mouth as needed for mild constipation. Hold for diarrhea 02/28/15  Yes , Modena Nunnery, MD  polyvinyl alcohol (LIQUIFILM TEARS) 1.4 % ophthalmic solution Place 1 drop into both eyes as needed for dry eyes.    Yes [provider]  SENSIPAR 30 MG tablet TAKE 1 TABLET BY MOUTH ONCE DAILY AFTER THE EVENING MEAL 08/19/15  Yes [place], Modena Nunnery, MD  sevelamer (RENVELA) 800 MG tablet Take 800-2,400 mg by mouth 5 (five) times daily. Take 3 tablets  three times daily with meals and take 1 tablet twice daily with snacks   Yes [provider]  traMADol (ULTRAM) 50 MG tablet Take 1 tablet (50 mg total) by mouth every 12 (twelve) hours as needed. 05/28/16  Yes [place], Modena Nunnery, MD  Travoprost, BAK Free, (TRAVATAN) 0.004 % SOLN ophthalmic solution Place 1 drop into both eyes at bedtime. 02/18/16  Yes [place], Modena Nunnery, MD  VENTOLIN HFA 108 (90 Base) MCG/ACT inhaler INHALE (2) PUFFS EVERY 4 HOURS AS NEEDED FOR WHEEZING OR SHORTNESS OFBREATH(COUGH). 03/25/16  Yes [place], Modena Nunnery, MD  Lancets MISC Use as directed to monitor FSBS 1x daily x2 weeks, then 1x daily PRN S/Sx hypoglycemia. Dx: E11.9. 10/09/15   Alycia Rossetti, MD  Multiple Vitamin (DAILY-VITE) TABS TAKE 1 TABLET BY MOUTH ONCE DAILY. (ON DAY OF DIALYSIS, TAKE AFTER DIALYSIS) Patient not taking: Reported on 08/10/2016 03/24/16   Alycia Rossetti, MD  OXYGEN Inhale 2 L into the lungs daily as needed (shortness of breath).    [provider]    STOOL SOFTENER 100 MG capsule TAKE 1 CAPSULE BY MOUTH TWICE DAILY. Patient not taking: Reported on 08/10/2016 03/24/16   Alycia Rossetti, MD    Physical Exam: Vitals:   08/10/16 2000 08/10/16 2212 08/10/16 2230 08/10/16 2300  BP: (!) 169/68 (!) 162/56 (!) 161/63 (!) 161/89  Pulse: 71 (!) 120 64 67  Resp: (!) 0 15 16 15   Temp:      TempSrc:      SpO2: 100% 100% 100% 100%     General: Appears mildly anxious, sting in bed. Eyes:  PERRL, EOMI, normal lids, iris ENT:  Dry mucous members, poor dentition  Neck:  no LAD, masses or thyromegaly Cardiovascular:  RRR, left AVG with thrill present 3/6 systolic murmur 1+ lower extremity pitting edema  Respiratory: Wheezing throughout. Diminished breath sounds in bases. Increased effort. Abdomen:  soft, ntnd, NABS Skin:  no rash or induration seen on limited exam Musculoskeletal:  grossly normal tone BUE/BLE, good ROM, no bony abnormality Psychiatric:  grossly normal mood and affect, speech fluent and appropriate, AOx3 Neurologic:  CN 2-12 grossly intact, moves all extremities in coordinated fashion, sensation intact  Labs on Admission: I have personally reviewed following labs and imaging studies  CBC:  Recent Labs Lab 08/10/16 1944  WBC 6.0  HGB 8.9*  HCT 27.8*  MCV 78.5  PLT 220   Basic Metabolic Panel:  Recent Labs Lab 08/10/16 1751  NA 135  K 4.7  CL 95*  CO2 31  GLUCOSE 140*  BUN 37*  CREATININE 6.96*  CALCIUM 8.8*   GFR: Estimated Creatinine Clearance: 6.1 mL/min (A) (by C-G formula based on SCr of 6.96 mg/dL (H)). Liver Function Tests: No results for input(s): AST, ALT, ALKPHOS, BILITOT, PROT, ALBUMIN in the last 168 hours. No results for input(s): LIPASE, AMYLASE in the last 168 hours. No results for input(s): AMMONIA in the last 168 hours. Coagulation Profile: No results for input(s): INR, PROTIME in the last 168 hours. Cardiac Enzymes: No results for input(s): CKTOTAL, CKMB, CKMBINDEX, TROPONINI in the  last 168 hours. BNP (last 3 results) No results for input(s): PROBNP in the last 8760 hours. HbA1C: No results for input(s): HGBA1C in the last 72 hours. CBG: No results for input(s): GLUCAP in the last 168 hours. Lipid Profile: No results for  input(s): CHOL, HDL, LDLCALC, TRIG, CHOLHDL, LDLDIRECT in the last 72 hours. Thyroid Function Tests: No results for input(s): TSH, T4TOTAL, FREET4, T3FREE, THYROIDAB in the last 72 hours. Anemia Panel: No results for input(s): VITAMINB12, FOLATE, FERRITIN, TIBC, IRON, RETICCTPCT in the last 72 hours. Urine analysis:    Component Value Date/Time   COLORURINE YELLOW 01/31/2011 2039   APPEARANCEUR CLEAR 01/31/2011 2039   LABSPEC 1.010 01/31/2011 2039   PHURINE 6.5 01/31/2011 2039   GLUCOSEU NEGATIVE 01/31/2011 2039   HGBUR NEGATIVE 01/31/2011 2039   HGBUR trace-lysed 09/05/2006 0000   BILIRUBINUR NEGATIVE 01/31/2011 2039   [place] 01/31/2011 2039   PROTEINUR 30 (A) 01/31/2011 2039   UROBILINOGEN 0.2 01/31/2011 2039   NITRITE NEGATIVE 01/31/2011 2039   LEUKOCYTESUR NEGATIVE 01/31/2011 2039    Creatinine Clearance: Estimated Creatinine Clearance: 6.1 mL/min (A) (by C-G formula based on SCr of 6.96 mg/dL (H)).  Sepsis Labs: @LABRCNTIP (procalcitonin:4,lacticidven:4) )No results found for this or any previous visit (from the past 240 hour(s)).   Radiological Exams on Admission: Dg Chest 2 View  Result Date: 08/10/2016 CLINICAL DATA:  Worsening shortness of breath and chest tightness for the past week. EXAM: CHEST  2 VIEW COMPARISON:  05/07/2016. FINDINGS: Stable elevated left hemidiaphragm. Mild left basilar atelectasis. Minimal right basilar atelectasis. Stable enlarged cardiac silhouette and tortuous and partially calcified thoracic aorta. Mild prominence of the interstitial markings without significant change. No pleural fluid seen. Diffuse osteopenia. Bilateral shoulder degenerative changes and superior migration of the right  humeral head. IMPRESSION: 1. Mild left basilar atelectasis and minimal right basilar atelectasis. 2. Stable cardiomegaly and mild chronic interstitial lung disease. 3. Bilateral shoulder degenerative changes and evidence of a large, chronic right rotator cuff tear. Electronically Signed   By: Claudie Revering M.D.   On: 08/10/2016 17:37   Ct Chest Wo Contrast  Result Date: 08/10/2016 CLINICAL DATA:  83 year old female with shortness of breath and wheezing. EXAM: CT CHEST WITHOUT CONTRAST TECHNIQUE: Multidetector CT imaging of the chest was performed following the standard protocol without IV contrast. COMPARISON:  Chest radiograph dated 08/10/2016 FINDINGS: Evaluation of this exam is limited in the absence of intravenous contrast. Cardiovascular: There is moderate cardiomegaly. No pericardial effusion. Multi vessel coronary vascular calcification with involvement of the LAD, RCA, and left circumflex artery. There is advanced atherosclerotic calcification of the thoracic aorta. No aneurysmal dilatation. Mild prominence of the central pulmonary vasculature, likely suggestive of underlying pulmonary hypertension. Mediastinum/Nodes: There is no hilar or mediastinal adenopathy. The esophagus is grossly unremarkable. Heterogeneous thyroid gland. Lungs/Pleura: There is eventration of the left hemidiaphragm. There is consolidative changes of the basal segment of the left lower lobe, likely atelectasis. Infiltrate is not entirely excluded. Clinical correlation is recommended. Right lung base subpleural thickening/ scarring. There is no pleural effusion or pneumothorax. The central airways are patent. Upper Abdomen: There is pneumobilia with air within the gallbladder. Multiple noncalcified stone noted in the gallbladder neck. There is a 4.7 x 3.0 cm lobulated low attenuating lesion in the left upper abdomen which is not well characterized. This lesion appears to arise from the tail of the pancreas and demonstrates a fluid  density and may represent a pancreatic pseudocyst versus a cystic neoplasm. Further characterization with nonemergent MRI is recommended. There is bilateral renal atrophy. Musculoskeletal: There is a 4.3 x 5.7 cm solid-appearing mass in the right breast. Correlation with clinical exam and mammographic studies, as clinically indicated, recommended. Smaller partially calcified nodular densities noted adjacent to the larger mass. Multilevel degenerative  changes of the spine. No acute osseous pathology. IMPRESSION: 1. Eventration of the left hemidiaphragm with left lung base consolidative changes, likely atelectasis. Pneumonia is not excluded. Clinical correlation is recommended. 2. Cardiomegaly with multi vessel coronary vascular calcification. 3.  Aortic Atherosclerosis (ICD10-I70.0). 4. Gallstones. There is pneumobilia, likely related to patulous ampulla of water. Clinical correlation is recommended. 5. Ovoid cystic appearing lesion in the tail of the pancreas may represent a pseudocyst or a cystic neoplasm. Further characterization with nonemergent MRI recommended. 6. Right breast mass. Correlation with clinical exam and mammographic studies, as clinically indicated, recommended. Electronically Signed   By: Anner Crete M.D.   On: 08/10/2016 22:21    EKG: Independently reviewed.  Sinus. Nonspecific T-wave changes noted though no significant change from previous. No ACS.  Assessment/Plan Active Problems:   Anemia in chronic kidney disease   ESRD (end stage renal disease) on dialysis (HCC)   Acute on chronic respiratory failure (HCC)   COPD with acute exacerbation (HCC)   Essential hypertension   Chronic pain   History of chronic respiratory failure. Likely multifactorial including COPD exacerbation and CHF exacerbation in the setting of ESRD. Patient does not make any urine. No fevers. Chest x-ray as above. BNP 3081, WBC 6.0, afebrile and vital signs are stable. Lactic acid 0.8. Troponin 0.06. -  Dialysis per nephrology - for fluid removal due to CHF - DuoNeb, Solu-Medrol 60 Q24, doxycycline - O2 PRN - Echo  ESRD: fluid overloaded. MWF.  - dialysis per Nephrology. Nephrology will need to be consulted in am. No need for emergen dialysis.  - Continue renal vitamins  Chest pain: currently pain free - continue nitro prn - cycle trop, Tele  Chronic pain: - continue tramadol  HTN: - continue Dilt, Imdur  Hypothyroid: - continue synthroid  GERD: - continue ppi  Depression: - continue celexa, remeron  Anemia: Hemoglobin 9. At baseline - CBC in a.m.   DVT prophylaxis: hep  Code Status: full  Family Communication: none  Disposition Plan: pending dialysis   Consults called: none  Admission status: inpt    Adalis Gatti J MD [facility]  If 7PM-7AM, please contact night-coverage [email or web address] Password [facility]  08/11/2016, 12:34 AM

## 2016-08-11 NOTE — Progress Notes (Signed)
  Echocardiogram 2D Echocardiogram has been performed.  Anevay Campanella G Abbegail Matuska 08/11/2016, 12:00 PM

## 2016-08-11 NOTE — ED Notes (Signed)
Attempted report x 1; name and call back number provided 

## 2016-08-12 LAB — BASIC METABOLIC PANEL
ANION GAP: 8 (ref 5–15)
BUN: 14 mg/dL (ref 6–20)
CO2: 32 mmol/L (ref 22–32)
Calcium: 8.7 mg/dL — ABNORMAL LOW (ref 8.9–10.3)
Chloride: 95 mmol/L — ABNORMAL LOW (ref 101–111)
Creatinine, Ser: 3.83 mg/dL — ABNORMAL HIGH (ref 0.44–1.00)
GFR calc Af Amer: 11 mL/min — ABNORMAL LOW (ref >60)
GFR calc non Af Amer: 10 mL/min — ABNORMAL LOW (ref >60)
GLUCOSE: 112 mg/dL — AB (ref 65–99)
POTASSIUM: 3.9 mmol/L (ref 3.5–5.1)
Sodium: 135 mmol/L (ref 135–145)

## 2016-08-12 LAB — HEMOGLOBIN A1C
Hgb A1c MFr Bld: 5.3 % (ref 4.8–5.6)
Mean Plasma Glucose: 105 mg/dL

## 2016-08-12 LAB — HEPATITIS B SURFACE ANTIGEN: Hepatitis B Surface Ag: NEGATIVE

## 2016-08-12 LAB — CBC
HCT: 26.9 % — ABNORMAL LOW (ref 36.0–46.0)
Hemoglobin: 8.7 g/dL — ABNORMAL LOW (ref 12.0–15.0)
MCH: 24.9 pg — ABNORMAL LOW (ref 26.0–34.0)
MCHC: 32.3 g/dL (ref 30.0–36.0)
MCV: 77.1 fL — ABNORMAL LOW (ref 78.0–100.0)
PLATELETS: 151 10*3/uL (ref 150–400)
RBC: 3.49 MIL/uL — AB (ref 3.87–5.11)
RDW: 15 % (ref 11.5–15.5)
WBC: 4.4 10*3/uL (ref 4.0–10.5)

## 2016-08-12 LAB — RENAL FUNCTION PANEL
Albumin: 3.4 g/dL — ABNORMAL LOW (ref 3.5–5.0)
Anion gap: 9 (ref 5–15)
BUN: 54 mg/dL — AB (ref 6–20)
CALCIUM: 8.5 mg/dL — AB (ref 8.9–10.3)
CHLORIDE: 93 mmol/L — AB (ref 101–111)
CO2: 27 mmol/L (ref 22–32)
CREATININE: 8.61 mg/dL — AB (ref 0.44–1.00)
GFR calc non Af Amer: 4 mL/min — ABNORMAL LOW (ref >60)
GFR, EST AFRICAN AMERICAN: 4 mL/min — AB (ref >60)
Glucose, Bld: 203 mg/dL — ABNORMAL HIGH (ref 65–99)
Phosphorus: 2.9 mg/dL (ref 2.5–4.6)
Potassium: 5.4 mmol/L — ABNORMAL HIGH (ref 3.5–5.1)
SODIUM: 129 mmol/L — AB (ref 135–145)

## 2016-08-12 LAB — HEPATITIS B SURFACE ANTIBODY,QUALITATIVE: HEP B S AB: REACTIVE

## 2016-08-12 LAB — GLUCOSE, CAPILLARY
GLUCOSE-CAPILLARY: 213 mg/dL — AB (ref 65–99)
Glucose-Capillary: 101 mg/dL — ABNORMAL HIGH (ref 65–99)
Glucose-Capillary: 166 mg/dL — ABNORMAL HIGH (ref 65–99)
Glucose-Capillary: 213 mg/dL — ABNORMAL HIGH (ref 65–99)
Glucose-Capillary: 160 mg/dL — ABNORMAL HIGH (ref 65–99)

## 2016-08-12 MED ORDER — IPRATROPIUM-ALBUTEROL 0.5-2.5 (3) MG/3ML IN SOLN: 3.0000 mL | Freq: Four times a day (QID) | RESPIRATORY_TRACT | Status: DC | PRN

## 2016-08-12 MED ORDER — DOXERCALCIFEROL 4 MCG/2ML IV SOLN
INTRAVENOUS | Status: AC
  Administered 2016-08-12: 4.5 ug via INTRAVENOUS
  Filled 2016-08-12: qty 4

## 2016-08-12 MED ORDER — DARBEPOETIN ALFA 150 MCG/0.3ML IJ SOSY
PREFILLED_SYRINGE | INTRAMUSCULAR | Status: AC
  Administered 2016-08-12: 150 ug via INTRAVENOUS
  Filled 2016-08-12: qty 0.3

## 2016-08-12 MED ORDER — PREDNISONE 20 MG PO TABS
40.0000 mg | ORAL_TABLET | Freq: Every day | ORAL | Status: DC
  Administered 2016-08-13 – 2016-08-14 (×2): 40 mg via ORAL
  Filled 2016-08-12 (×2): qty 2

## 2016-08-12 MED ORDER — DOXYCYCLINE HYCLATE 100 MG PO TABS
100.0000 mg | ORAL_TABLET | Freq: Two times a day (BID) | ORAL | Status: DC
  Administered 2016-08-12 – 2016-08-14 (×4): 100 mg via ORAL
  Filled 2016-08-12 (×4): qty 1

## 2016-08-12 NOTE — Progress Notes (Signed)
HD tx completed @ 0355 w/o problem, UF goal met, blood rinsed back, report called to Tawni Carnes, RN

## 2016-08-12 NOTE — Consult Note (Signed)
   [facility] CM Inpatient Consult   08/12/2016  RANDALL RAMPERSAD 01-05-1926 [id number]   Patient screened for [facility] services in the [facility].  Chart reviewed for needs admitted with shortness of breath, HX of HD three times a week in [place]. Will follow for needs. Please contact for services if needed. Came by but the patient was asleep on rounds.   For questions, call:  Natividad Brood, RN BSN [facility] Liaison  [phone] business mobile phone Toll free office [phone]

## 2016-08-12 NOTE — Progress Notes (Signed)
[facility]                                                                              Patient Demographics  [name], is a 83 y.o. female, DOB - 11-27-1925, HRC:[id number]  Admit date - 08/10/2016   Admitting Physician Waldemar Dickens, MD  Outpatient Primary MD for the patient is [facility], Modena Nunnery, MD  Outpatient specialists:   LOS - 2  days   Medical records reviewed and are as summarized below:    Chief Complaint  Patient presents with  . Shortness of Breath       Brief summary   Patient is a 83 year old female with hypothyroidism, SVT, history of CVA, hyponatremia, hypertension, GERD, diabetes, depression, ESRD MWF, CHF, stomach CA presented to ED from PCPs office due to acute respiratory failure with hypoxia, O2 sats in 70s on 2 L O2 via nasal cannula. Patient reported gradual worsening of shortness of breath, wheezing, occasional productive cough with whitish phlegm. Also reported intermittent chest pain at rest.   Assessment & Plan    Principal Problem:   Acute on chronic respiratory failure (HCC) With hypoxia likely due to COPD and CHF exacerbation in the setting of ESRD - Improving, O2 sats 97% on 3 L - Chest x-ray with mild left basilar atelectasis and minimal right basilar atelectasis, mild chronic interstitial lung disease  - CT chest showed atelectasis versus pneumonia of the left lung base  - Currently improving, continue scheduled nebs, hemodialysis, doxycycline.  -No significant wheezing, transition to oral prednisone in a.m.   - Hemodialysis per nephrology for volume management  - 2-D echoshowed EF of 60-65% with grade 1 diastolic dysfunction.   Active Problems:    ESRD (end stage renal disease) on dialysis [facility]) - Nephrology consulted, dialysis MWF -  patient underwent dialysis overnight, has underlying diastolic dysfunction, volume control with dialysis   Abnormal CT chest - Reviewed CT chest with the patient, gallstones,  ovoid cystic appearing lesion in the tail of pancreas representing pseudocyst versus cystic neoplasm 4.7 x3.0cm recommended nonemergent MRI abdomen, right breast mass 4.3cm x 5.7cm. Discussed in detail with the patient regarding the findings on the CT chest. She had a normal mammogram in 11/2013. Patient requested MRI abdomen.  - MRI of the abdomen showed lobulated cystic mass involving the pancreatic tail has enlarged from previous MRI of the 2011 suspicious for cystic pancreatic neoplasm. No communication with the pancreatic duct or aggressive characteristics demonstrated by noncontrast imaging. Spoke with Dr. Benay Spice from oncology, recommended that patient be referred as an outpatient to Dr. Ardis Hughs from gastroenterology who can do EUS for biopsy for aspiration, further workup, CEA (if the patient and family is interested) by patient's PCP.  - Grossly stable large right breast mass from previous MRI from 2011 supporting a benign etiology. - All the results were discussed in detail with patient's daughter at the bedside     Anemia in chronic kidney disease - On ESA as outpatient, hemoglobin 9.4, follow closely     Essential hypertension - Currently stable, continue Imdur, Cardizem    Chronic pain -Currently stable, continue tramadol  Code Status: full CODE STATUS  DVT Prophylaxis:   SCD's Family Communication: Discussed in detail with the patient, all imaging results, lab results explained to the patient and daughter  Disposition Plan:   Time Spent in minutes   25 minutes  Procedures:  CT chest   Consultants:   Nephrology  Antimicrobials   doxycycline    Medications  Scheduled Meds: . aspirin  81 mg Oral Daily  . Chlorhexidine Gluconate Cloth  6 each Topical Q0600  . cinacalcet  30 mg Oral Q supper  . citalopram  20 mg Oral Daily  . darbepoetin (ARANESP) injection - DIALYSIS  150 mcg Intravenous Q Wed-HD  . diltiazem  180 mg Oral Daily  . doxercalciferol  4.5 mcg  Intravenous Q M,W,F-HD  . doxycycline  100 mg Oral Q12H  . heparin  5,000 Units Subcutaneous Q8H  . insulin aspart  0-5 Units Subcutaneous QHS  . insulin aspart  0-9 Units Subcutaneous TID WC  . isosorbide mononitrate  60 mg Oral Daily  . latanoprost  1 drop Both Eyes QHS  . levothyroxine  25 mcg Oral QAC breakfast  . methylPREDNISolone (SOLU-MEDROL) injection  60 mg Intravenous Daily  . mirtazapine  15 mg Oral QHS  . multivitamin with minerals  1 tablet Oral Daily  . mupirocin ointment  1 application Nasal BID  . pantoprazole  40 mg Oral Daily  . sevelamer carbonate  2,400 mg Oral TID WC   Continuous Infusions: . sodium chloride    . sodium chloride     PRN Meds:.sodium chloride, sodium chloride, acetaminophen **OR** acetaminophen, alteplase, heparin, heparin, hydrALAZINE, ipratropium-albuterol, lidocaine (PF), lidocaine-prilocaine, nitroGLYCERIN, ondansetron **OR** ondansetron (ZOFRAN) IV, pentafluoroprop-tetrafluoroeth, polyethylene glycol, sevelamer carbonate, traMADol   Antibiotics   Anti-infectives    Start     Dose/Rate Route Frequency Ordered Stop   08/12/16 2200  doxycycline (VIBRA-TABS) tablet 100 mg     100 mg Oral Every 12 hours 08/12/16 1101     08/11/16 0045  doxycycline (VIBRAMYCIN) 100 mg in dextrose 5 % 250 mL IVPB  Status:  Discontinued     100 mg 125 mL/hr over 120 Minutes Intravenous Every 12 hours 08/11/16 0032 08/12/16 1101        Subjective:   Karlton Lemon was seen and examined today. Feeling a lot better today, shortness of breath is improving, no chest pain, wheezing is improving, no fevers or chills.  Patient denies dizziness, abdominal pain, N/V/D/C, new weakness, numbess, tingling. No acute events overnight.    Objective:   Vitals:   08/12/16 0355 08/12/16 0432 08/12/16 0518 08/12/16 0900  BP: (!) 160/74 (!) 175/76 (!) 160/87 (!) 161/46  Pulse: (!) 57 (!) 101 93 85  Resp: 15 (!) 21 18 18   Temp:  97.9 F (36.6 C) 98 F (36.7 C) 98 F (36.7  C)  TempSrc:  Oral Oral Oral  SpO2: 93% 93% 97% 97%  Weight:  81.9 kg (180 lb 8.9 oz)    Height:        Intake/Output Summary (Last 24 hours) at 08/12/16 1329 Last data filed at 08/12/16 0900  Gross per 24 hour  Intake              740 ml  Output             4000 ml  Net            -3260 ml     Wt Readings from Last 3 Encounters:  08/12/16 81.9 kg (180 lb  8.9 oz)  08/10/16 84.8 kg (187 lb)  06/01/16 86.2 kg (190 lb)     Exam   General: Alert and oriented x 3, NAD, Hearing deficit  Eyes:   HEENT:  Atraumatic, normocephalic, normal oropharynx  Cardiovascular: S1 S2 auscultated, no rubs, murmurs or gallops. Regular rate and rhythm. +pedal edema b/l  Respiratory: Clear to auscultation bilaterally, no wheezing, rales or rhonchi  Gastrointestinal: Soft, nontender, nondistended, + bowel sounds  Ext: + pedal edema bilaterally  Neuro:no new FND   Musculoskeletal: No digital cyanosis, clubbing  Skin: No rashes  Psych: Normal affect and demeanor, alert and oriented x3    Data Reviewed:  I have personally reviewed following labs and imaging studies  Micro Results Recent Results (from the past 240 hour(s))  MRSA PCR Screening     Status: Abnormal   Collection Time: 08/11/16 10:30 AM  Result Value Ref Range Status   MRSA by PCR POSITIVE (A) NEGATIVE Final    Comment:        The GeneXpert MRSA Assay (FDA approved for NASAL specimens only), is one component of a comprehensive MRSA colonization surveillance program. It is not intended to diagnose MRSA infection nor to guide or monitor treatment for MRSA infections. RESULT CALLED TO, READ BACK BY AND VERIFIED WITH: Marta Antu RN 12:10 08/11/16 ([email or web address])     Radiology Reports Dg Chest 2 View  Result Date: 08/10/2016 CLINICAL DATA:  Worsening shortness of breath and chest tightness for the past week. EXAM: CHEST  2 VIEW COMPARISON:  05/07/2016. FINDINGS: Stable elevated left hemidiaphragm. Mild left basilar  atelectasis. Minimal right basilar atelectasis. Stable enlarged cardiac silhouette and tortuous and partially calcified thoracic aorta. Mild prominence of the interstitial markings without significant change. No pleural fluid seen. Diffuse osteopenia. Bilateral shoulder degenerative changes and superior migration of the right humeral head. IMPRESSION: 1. Mild left basilar atelectasis and minimal right basilar atelectasis. 2. Stable cardiomegaly and mild chronic interstitial lung disease. 3. Bilateral shoulder degenerative changes and evidence of a large, chronic right rotator cuff tear. Electronically Signed   By: Claudie Revering M.D.   On: 08/10/2016 17:37   Ct Chest Wo Contrast  Result Date: 08/10/2016 CLINICAL DATA:  83 year old female with shortness of breath and wheezing. EXAM: CT CHEST WITHOUT CONTRAST TECHNIQUE: Multidetector CT imaging of the chest was performed following the standard protocol without IV contrast. COMPARISON:  Chest radiograph dated 08/10/2016 FINDINGS: Evaluation of this exam is limited in the absence of intravenous contrast. Cardiovascular: There is moderate cardiomegaly. No pericardial effusion. Multi vessel coronary vascular calcification with involvement of the LAD, RCA, and left circumflex artery. There is advanced atherosclerotic calcification of the thoracic aorta. No aneurysmal dilatation. Mild prominence of the central pulmonary vasculature, likely suggestive of underlying pulmonary hypertension. Mediastinum/Nodes: There is no hilar or mediastinal adenopathy. The esophagus is grossly unremarkable. Heterogeneous thyroid gland. Lungs/Pleura: There is eventration of the left hemidiaphragm. There is consolidative changes of the basal segment of the left lower lobe, likely atelectasis. Infiltrate is not entirely excluded. Clinical correlation is recommended. Right lung base subpleural thickening/ scarring. There is no pleural effusion or pneumothorax. The central airways are patent.  Upper Abdomen: There is pneumobilia with air within the gallbladder. Multiple noncalcified stone noted in the gallbladder neck. There is a 4.7 x 3.0 cm lobulated low attenuating lesion in the left upper abdomen which is not well characterized. This lesion appears to arise from the tail of the pancreas and demonstrates a fluid density and may represent a pancreatic  pseudocyst versus a cystic neoplasm. Further characterization with nonemergent MRI is recommended. There is bilateral renal atrophy. Musculoskeletal: There is a 4.3 x 5.7 cm solid-appearing mass in the right breast. Correlation with clinical exam and mammographic studies, as clinically indicated, recommended. Smaller partially calcified nodular densities noted adjacent to the larger mass. Multilevel degenerative changes of the spine. No acute osseous pathology. IMPRESSION: 1. Eventration of the left hemidiaphragm with left lung base consolidative changes, likely atelectasis. Pneumonia is not excluded. Clinical correlation is recommended. 2. Cardiomegaly with multi vessel coronary vascular calcification. 3.  Aortic Atherosclerosis (ICD10-I70.0). 4. Gallstones. There is pneumobilia, likely related to patulous ampulla of water. Clinical correlation is recommended. 5. Ovoid cystic appearing lesion in the tail of the pancreas may represent a pseudocyst or a cystic neoplasm. Further characterization with nonemergent MRI recommended. 6. Right breast mass. Correlation with clinical exam and mammographic studies, as clinically indicated, recommended. Electronically Signed   By: Anner Crete M.D.   On: 08/10/2016 22:21   Mr Abdomen Wo Contrast  Result Date: 08/11/2016 CLINICAL DATA:  Cystic lesion in the pancreatic tail on CT. End-stage renal disease on hemodialysis. Right breast mass and cholelithiasis. EXAM: MRI ABDOMEN WITHOUT CONTRAST TECHNIQUE: Multiplanar multisequence MR imaging was performed without the administration of intravenous contrast.  COMPARISON:  Chest CT 08/10/2016.  Abdominal MRI 11/03/2009. FINDINGS: Despite efforts by the technologist and patient, moderate to marked motion artifact is present on today's exam and could not be eliminated. This reduces exam sensitivity and specificity. Thin section MRCP images are very limited. Lower chest: Elevation of the left hemidiaphragm and patchy bibasilar airspace opacities are similar to recent CT. The heart is enlarged. Hepatobiliary: The liver demonstrates markedly decreased T2 signal, consistent with hemosiderosis/ iron overload. There is corresponding loss of signal on the gradient echo in phase images. No focal hepatic lesions are identified. Multiple gallstones are present associated with mild nonspecific gallbladder wall thickening. There is no significant biliary dilatation. Pneumobilia better seen on CT. Pancreas: Diffusely atrophied. Enlarging multilobular cystic mass in the pancreatic tail measures 5.1 x 3.4 x 3.1 cm. This previously measured 10 mm maximally. No solid components are identified on noncontrast imaging. There is no pancreatic ductal dilatation. Additional tiny cystic lesions more proximally in the pancreatic tail and body are unchanged. Spleen: Stable in size with a stable T2 hyperintense lesion medially measuring 2.5 cm. Diffusely decreased splenic T2 signal consistent with hemosiderosis/iron overload. Adrenals/Urinary Tract: Both adrenal glands appear normal. Both kidneys demonstrate marked cortical thinning consistent with chronic renal failure. There are small renal cysts bilaterally. No hydronephrosis. Stomach/Bowel: No evidence of bowel wall thickening, distention or surrounding inflammatory change. Vascular/Lymphatic: There are no enlarged abdominal lymph nodes. No acute vascular findings. Other: Generalized soft tissue edema. No significant ascites. There is a large lobulated right breast mass as seen on recent CT, measuring 5.5 x 4.2 x 3.9 cm. This is partially visible  on previous MRI and has not markedly enlarged during this interval. Musculoskeletal: No acute or significant osseous findings. Mild spondylosis. Probable chronic changes of renal osteodystrophy involving the sacroiliac joints. IMPRESSION: 1. Lobulated cystic mass involving the pancreatic tail has enlarged from previous MRI of 2011, suspicious for cystic pancreatic neoplasm. No communication with the pancreatic duct or aggressive characteristics demonstrated by noncontrast imaging. 2. 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 Stable splenic lesion. 3. Cholelithiasis without evidence of biliary dilatation. Motion and pneumobilia limited evaluation of the biliary system. 4. Grossly stable large right breast mass from previous MRI  from 2011, supporting a benign etiology. 5. Sequela of chronic renal failure with atrophy kidneys, generalized soft tissue edema and probable changes of renal osteodystrophy. Electronically Signed   By: Richardean Sale M.D.   On: 08/11/2016 17:14   Mr 3d Recon At Scanner  Result Date: 08/11/2016 CLINICAL DATA:  Cystic lesion in the pancreatic tail on CT. End-stage renal disease on hemodialysis. Right breast mass and cholelithiasis. EXAM: MRI ABDOMEN WITHOUT CONTRAST TECHNIQUE: Multiplanar multisequence MR imaging was performed without the administration of intravenous contrast. COMPARISON:  Chest CT 08/10/2016.  Abdominal MRI 11/03/2009. FINDINGS: Despite efforts by the technologist and patient, moderate to marked motion artifact is present on today's exam and could not be eliminated. This reduces exam sensitivity and specificity. Thin section MRCP images are very limited. Lower chest: Elevation of the left hemidiaphragm and patchy bibasilar airspace opacities are similar to recent CT. The heart is enlarged. Hepatobiliary: The liver demonstrates markedly decreased T2 signal, consistent with hemosiderosis/ iron overload. There is corresponding loss of signal on the  gradient echo in phase images. No focal hepatic lesions are identified. Multiple gallstones are present associated with mild nonspecific gallbladder wall thickening. There is no significant biliary dilatation. Pneumobilia better seen on CT. Pancreas: Diffusely atrophied. Enlarging multilobular cystic mass in the pancreatic tail measures 5.1 x 3.4 x 3.1 cm. This previously measured 10 mm maximally. No solid components are identified on noncontrast imaging. There is no pancreatic ductal dilatation. Additional tiny cystic lesions more proximally in the pancreatic tail and body are unchanged. Spleen: Stable in size with a stable T2 hyperintense lesion medially measuring 2.5 cm. Diffusely decreased splenic T2 signal consistent with hemosiderosis/iron overload. Adrenals/Urinary Tract: Both adrenal glands appear normal. Both kidneys demonstrate marked cortical thinning consistent with chronic renal failure. There are small renal cysts bilaterally. No hydronephrosis. Stomach/Bowel: No evidence of bowel wall thickening, distention or surrounding inflammatory change. Vascular/Lymphatic: There are no enlarged abdominal lymph nodes. No acute vascular findings. Other: Generalized soft tissue edema. No significant ascites. There is a large lobulated right breast mass as seen on recent CT, measuring 5.5 x 4.2 x 3.9 cm. This is partially visible on previous MRI and has not markedly enlarged during this interval. Musculoskeletal: No acute or significant osseous findings. Mild spondylosis. Probable chronic changes of renal osteodystrophy involving the sacroiliac joints. IMPRESSION: 1. Lobulated cystic mass involving the pancreatic tail has enlarged from previous MRI of 2011, suspicious for cystic pancreatic neoplasm. No communication with the pancreatic duct or aggressive characteristics demonstrated by noncontrast imaging. 2. 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 Stable splenic lesion. 3. Cholelithiasis  without evidence of biliary dilatation. Motion and pneumobilia limited evaluation of the biliary system. 4. Grossly stable large right breast mass from previous MRI from 2011, supporting a benign etiology. 5. Sequela of chronic renal failure with atrophy kidneys, generalized soft tissue edema and probable changes of renal osteodystrophy. Electronically Signed   By: Richardean Sale M.D.   On: 08/11/2016 17:14    Lab Data:  CBC:  Recent Labs Lab 08/10/16 1944 08/11/16 0004 08/11/16 0608  WBC 6.0 4.4 5.0  HGB 8.9* 8.7* 9.4*  HCT 27.8* 26.9* 29.9*  MCV 78.5 77.1* 78.7  PLT 166 151 502   Basic Metabolic Panel:  Recent Labs Lab 08/10/16 1751 08/11/16 0006 08/11/16 0608 08/12/16 0616  NA 135 129* 134* 135  K 4.7 5.4* 5.3* 3.9  CL 95* 93* 95* 95*  CO2 31 27 30  32  GLUCOSE 140* 203* 146* 112*  BUN 37* 54* 42* 14  CREATININE 6.96* 8.61* 7.66* 3.83*  CALCIUM 8.8* 8.5* 8.5* 8.7*  MG  --   --  2.2  --   PHOS  --  2.9 3.7  --    GFR: Estimated Creatinine Clearance: 10.9 mL/min (A) (by C-G formula based on SCr of 3.83 mg/dL (H)). Liver Function Tests:  Recent Labs Lab 08/11/16 0006  ALBUMIN 3.4*   No results for input(s): LIPASE, AMYLASE in the last 168 hours. No results for input(s): AMMONIA in the last 168 hours. Coagulation Profile: No results for input(s): INR, PROTIME in the last 168 hours. Cardiac Enzymes:  Recent Labs Lab 08/11/16 0153 08/11/16 0608 08/11/16 1202  TROPONINI 0.05* 0.04* 0.03*   BNP (last 3 results) No results for input(s): PROBNP in the last 8760 hours. HbA1C:  Recent Labs  08/11/16 1249  HGBA1C 5.3   CBG:  Recent Labs Lab 08/11/16 1737 08/11/16 2120 08/12/16 0556 08/12/16 0814 08/12/16 1238  GLUCAP 215* 210* 101* 166* 213*   Lipid Profile: No results for input(s): CHOL, HDL, LDLCALC, TRIG, CHOLHDL, LDLDIRECT in the last 72 hours. Thyroid Function Tests: No results for input(s): TSH, T4TOTAL, FREET4, T3FREE, THYROIDAB in the  last 72 hours. Anemia Panel: No results for input(s): VITAMINB12, FOLATE, FERRITIN, TIBC, IRON, RETICCTPCT in the last 72 hours. Urine analysis:    Component Value Date/Time   COLORURINE YELLOW 01/31/2011 2039   APPEARANCEUR CLEAR 01/31/2011 2039   LABSPEC 1.010 01/31/2011 2039   PHURINE 6.5 01/31/2011 2039   GLUCOSEU NEGATIVE 01/31/2011 2039   HGBUR NEGATIVE 01/31/2011 2039   HGBUR trace-lysed 09/05/2006 0000   BILIRUBINUR NEGATIVE 01/31/2011 2039   [place] NEGATIVE 01/31/2011 2039   PROTEINUR 30 (A) 01/31/2011 2039   UROBILINOGEN 0.2 01/31/2011 2039   NITRITE NEGATIVE 01/31/2011 2039   LEUKOCYTESUR NEGATIVE 01/31/2011 2039     Ripudeep Rai M.D. [facility] 08/12/2016, 1:29 PM  Pager: [phone] Between 7am to 7pm - call Pager - [phone]  After 7pm go to [email or web address] - password [email or web address]  Call night coverage person covering after 7pm

## 2016-08-12 NOTE — Progress Notes (Signed)
HD tx initiated via 15Gx2 w/o problem, pull/push/flush equally w/o problem, VSS, will cont to monitor while on HD tx 

## 2016-08-12 NOTE — Progress Notes (Signed)
Subjective:  Ended up getting HD in the middle of the night- removed 4 liters, tolerated well- post weight 82.... 2.5 kg under EDW  Objective Vital signs in last 24 hours: Vitals:   08/12/16 0300 08/12/16 0355 08/12/16 0432 08/12/16 0518  BP: (!) 179/91 (!) 160/74 (!) 175/76 (!) 160/87  Pulse: 98 (!) 57 (!) 101 93  Resp: 18 15 (!) 21 18  Temp:   97.9 F (36.6 C) 98 F (36.7 C)  TempSrc:   Oral Oral  SpO2: 99% 93% 93% 97%  Weight:   81.9 kg (180 lb 8.9 oz)   Height:       Weight change: 0.776 kg (1 lb 11.4 oz)  Intake/Output Summary (Last 24 hours) at 08/12/16 0856 Last data filed at 08/12/16 0601  Gross per 24 hour  Intake              740 ml  Output             4000 ml  Net            -3260 ml    Dialyzes at [facility]  EDW 84.5. 350 blood flow rate 3 and half hours 15-gauge needles HD Bath 2 potassium/2.5 calcium, Heparin yes. Access AV fistula on the left is on Epogen 6200 every HD, Hectorol 4.5 g every HD, Sensipar 30 daily and Renvela   Assessment/Plan: 83 year old black female with ESRD. Presents with shortness of breath 1 shortness of breath- feel it could be multifactorial. She normally wears oxygen at baseline. Tells me that she thinks she has lost weight but her dry weight has not been decreased. Therefore, could have element of CHF. challenged fluid removal with dialysis overnight and that went well. She also appears to be more anemic than baseline and this could be contributing. 2 ESRD: Normally Monday Wednesday Friday at [facility]. Plan for dialysis again tomorrow with volume challenge via AV fistula. 3 Hypertension: Blood pressure is up. This is a good scenario for being able to pull fluid. She agrees to a temporary increase in dialysis time from 3 and half to 4 hours in an effort to remove more fluid.  On no blood pressure medications as an outpatient, another clue that she is overloaded and that is what is driving high BP 4. Anemia of ESRD: Is on an ESA as  outpatient. It seems however that her hemoglobin has dropped fairly acutely. We will increase ESA and monitor 5. Metabolic Bone Disease: Will continue home doses of Sensipar, Hectorol and also continue Renvela from phos binding- labs WNL   Issaih Kaus A    Labs: Basic Metabolic Panel:  Recent Labs Lab 08/11/16 0006 08/11/16 0608 08/12/16 0616  NA 129* 134* 135  K 5.4* 5.3* 3.9  CL 93* 95* 95*  CO2 27 30 32  GLUCOSE 203* 146* 112*  BUN 54* 42* 14  CREATININE 8.61* 7.66* 3.83*  CALCIUM 8.5* 8.5* 8.7*  PHOS 2.9 3.7  --    Liver Function Tests:  Recent Labs Lab 08/11/16 0006  ALBUMIN 3.4*   No results for input(s): LIPASE, AMYLASE in the last 168 hours. No results for input(s): AMMONIA in the last 168 hours. CBC:  Recent Labs Lab 08/10/16 1944 08/11/16 0004 08/11/16 0608  WBC 6.0 4.4 5.0  HGB 8.9* 8.7* 9.4*  HCT 27.8* 26.9* 29.9*  MCV 78.5 77.1* 78.7  PLT 166 151 172   Cardiac Enzymes:  Recent Labs Lab 08/11/16 0153 08/11/16 0608 08/11/16 1202  TROPONINI 0.05* 0.04*  0.03*   CBG:  Recent Labs Lab 08/11/16 1152 08/11/16 1737 08/11/16 2120 08/12/16 0556 08/12/16 0814  GLUCAP 312* 215* 210* 101* 166*    Iron Studies: No results for input(s): IRON, TIBC, TRANSFERRIN, FERRITIN in the last 72 hours. Studies/Results: Dg Chest 2 View  Result Date: 08/10/2016 CLINICAL DATA:  Worsening shortness of breath and chest tightness for the past week. EXAM: CHEST  2 VIEW COMPARISON:  05/07/2016. FINDINGS: Stable elevated left hemidiaphragm. Mild left basilar atelectasis. Minimal right basilar atelectasis. Stable enlarged cardiac silhouette and tortuous and partially calcified thoracic aorta. Mild prominence of the interstitial markings without significant change. No pleural fluid seen. Diffuse osteopenia. Bilateral shoulder degenerative changes and superior migration of the right humeral head. IMPRESSION: 1. Mild left basilar atelectasis and minimal right  basilar atelectasis. 2. Stable cardiomegaly and mild chronic interstitial lung disease. 3. Bilateral shoulder degenerative changes and evidence of a large, chronic right rotator cuff tear. Electronically Signed   By: Claudie Revering M.D.   On: 08/10/2016 17:37   Ct Chest Wo Contrast  Result Date: 08/10/2016 CLINICAL DATA:  83 year old female with shortness of breath and wheezing. EXAM: CT CHEST WITHOUT CONTRAST TECHNIQUE: Multidetector CT imaging of the chest was performed following the standard protocol without IV contrast. COMPARISON:  Chest radiograph dated 08/10/2016 FINDINGS: Evaluation of this exam is limited in the absence of intravenous contrast. Cardiovascular: There is moderate cardiomegaly. No pericardial effusion. Multi vessel coronary vascular calcification with involvement of the LAD, RCA, and left circumflex artery. There is advanced atherosclerotic calcification of the thoracic aorta. No aneurysmal dilatation. Mild prominence of the central pulmonary vasculature, likely suggestive of underlying pulmonary hypertension. Mediastinum/Nodes: There is no hilar or mediastinal adenopathy. The esophagus is grossly unremarkable. Heterogeneous thyroid gland. Lungs/Pleura: There is eventration of the left hemidiaphragm. There is consolidative changes of the basal segment of the left lower lobe, likely atelectasis. Infiltrate is not entirely excluded. Clinical correlation is recommended. Right lung base subpleural thickening/ scarring. There is no pleural effusion or pneumothorax. The central airways are patent. Upper Abdomen: There is pneumobilia with air within the gallbladder. Multiple noncalcified stone noted in the gallbladder neck. There is a 4.7 x 3.0 cm lobulated low attenuating lesion in the left upper abdomen which is not well characterized. This lesion appears to arise from the tail of the pancreas and demonstrates a fluid density and may represent a pancreatic pseudocyst versus a cystic neoplasm.  Further characterization with nonemergent MRI is recommended. There is bilateral renal atrophy. Musculoskeletal: There is a 4.3 x 5.7 cm solid-appearing mass in the right breast. Correlation with clinical exam and mammographic studies, as clinically indicated, recommended. Smaller partially calcified nodular densities noted adjacent to the larger mass. Multilevel degenerative changes of the spine. No acute osseous pathology. IMPRESSION: 1. Eventration of the left hemidiaphragm with left lung base consolidative changes, likely atelectasis. Pneumonia is not excluded. Clinical correlation is recommended. 2. Cardiomegaly with multi vessel coronary vascular calcification. 3.  Aortic Atherosclerosis (ICD10-I70.0). 4. Gallstones. There is pneumobilia, likely related to patulous ampulla of water. Clinical correlation is recommended. 5. Ovoid cystic appearing lesion in the tail of the pancreas may represent a pseudocyst or a cystic neoplasm. Further characterization with nonemergent MRI recommended. 6. Right breast mass. Correlation with clinical exam and mammographic studies, as clinically indicated, recommended. Electronically Signed   By: Anner Crete M.D.   On: 08/10/2016 22:21   Mr Abdomen Wo Contrast  Result Date: 08/11/2016 CLINICAL DATA:  Cystic lesion in the pancreatic tail on  CT. End-stage renal disease on hemodialysis. Right breast mass and cholelithiasis. EXAM: MRI ABDOMEN WITHOUT CONTRAST TECHNIQUE: Multiplanar multisequence MR imaging was performed without the administration of intravenous contrast. COMPARISON:  Chest CT 08/10/2016.  Abdominal MRI 11/03/2009. FINDINGS: Despite efforts by the technologist and patient, moderate to marked motion artifact is present on today's exam and could not be eliminated. This reduces exam sensitivity and specificity. Thin section MRCP images are very limited. Lower chest: Elevation of the left hemidiaphragm and patchy bibasilar airspace opacities are similar to recent  CT. The heart is enlarged. Hepatobiliary: The liver demonstrates markedly decreased T2 signal, consistent with hemosiderosis/ iron overload. There is corresponding loss of signal on the gradient echo in phase images. No focal hepatic lesions are identified. Multiple gallstones are present associated with mild nonspecific gallbladder wall thickening. There is no significant biliary dilatation. Pneumobilia better seen on CT. Pancreas: Diffusely atrophied. Enlarging multilobular cystic mass in the pancreatic tail measures 5.1 x 3.4 x 3.1 cm. This previously measured 10 mm maximally. No solid components are identified on noncontrast imaging. There is no pancreatic ductal dilatation. Additional tiny cystic lesions more proximally in the pancreatic tail and body are unchanged. Spleen: Stable in size with a stable T2 hyperintense lesion medially measuring 2.5 cm. Diffusely decreased splenic T2 signal consistent with hemosiderosis/iron overload. Adrenals/Urinary Tract: Both adrenal glands appear normal. Both kidneys demonstrate marked cortical thinning consistent with chronic renal failure. There are small renal cysts bilaterally. No hydronephrosis. Stomach/Bowel: No evidence of bowel wall thickening, distention or surrounding inflammatory change. Vascular/Lymphatic: There are no enlarged abdominal lymph nodes. No acute vascular findings. Other: Generalized soft tissue edema. No significant ascites. There is a large lobulated right breast mass as seen on recent CT, measuring 5.5 x 4.2 x 3.9 cm. This is partially visible on previous MRI and has not markedly enlarged during this interval. Musculoskeletal: No acute or significant osseous findings. Mild spondylosis. Probable chronic changes of renal osteodystrophy involving the sacroiliac joints. IMPRESSION: 1. Lobulated cystic mass involving the pancreatic tail has enlarged from previous MRI of 2011, suspicious for cystic pancreatic neoplasm. No communication with the  pancreatic duct or aggressive characteristics demonstrated by noncontrast imaging. 2. 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 Stable splenic lesion. 3. Cholelithiasis without evidence of biliary dilatation. Motion and pneumobilia limited evaluation of the biliary system. 4. Grossly stable large right breast mass from previous MRI from 2011, supporting a benign etiology. 5. Sequela of chronic renal failure with atrophy kidneys, generalized soft tissue edema and probable changes of renal osteodystrophy. Electronically Signed   By: Richardean Sale M.D.   On: 08/11/2016 17:14   Mr 3d Recon At Scanner  Result Date: 08/11/2016 CLINICAL DATA:  Cystic lesion in the pancreatic tail on CT. End-stage renal disease on hemodialysis. Right breast mass and cholelithiasis. EXAM: MRI ABDOMEN WITHOUT CONTRAST TECHNIQUE: Multiplanar multisequence MR imaging was performed without the administration of intravenous contrast. COMPARISON:  Chest CT 08/10/2016.  Abdominal MRI 11/03/2009. FINDINGS: Despite efforts by the technologist and patient, moderate to marked motion artifact is present on today's exam and could not be eliminated. This reduces exam sensitivity and specificity. Thin section MRCP images are very limited. Lower chest: Elevation of the left hemidiaphragm and patchy bibasilar airspace opacities are similar to recent CT. The heart is enlarged. Hepatobiliary: The liver demonstrates markedly decreased T2 signal, consistent with hemosiderosis/ iron overload. There is corresponding loss of signal on the gradient echo in phase images. No focal hepatic lesions are identified. Multiple gallstones  are present associated with mild nonspecific gallbladder wall thickening. There is no significant biliary dilatation. Pneumobilia better seen on CT. Pancreas: Diffusely atrophied. Enlarging multilobular cystic mass in the pancreatic tail measures 5.1 x 3.4 x 3.1 cm. This previously measured 10 mm maximally. No  solid components are identified on noncontrast imaging. There is no pancreatic ductal dilatation. Additional tiny cystic lesions more proximally in the pancreatic tail and body are unchanged. Spleen: Stable in size with a stable T2 hyperintense lesion medially measuring 2.5 cm. Diffusely decreased splenic T2 signal consistent with hemosiderosis/iron overload. Adrenals/Urinary Tract: Both adrenal glands appear normal. Both kidneys demonstrate marked cortical thinning consistent with chronic renal failure. There are small renal cysts bilaterally. No hydronephrosis. Stomach/Bowel: No evidence of bowel wall thickening, distention or surrounding inflammatory change. Vascular/Lymphatic: There are no enlarged abdominal lymph nodes. No acute vascular findings. Other: Generalized soft tissue edema. No significant ascites. There is a large lobulated right breast mass as seen on recent CT, measuring 5.5 x 4.2 x 3.9 cm. This is partially visible on previous MRI and has not markedly enlarged during this interval. Musculoskeletal: No acute or significant osseous findings. Mild spondylosis. Probable chronic changes of renal osteodystrophy involving the sacroiliac joints. IMPRESSION: 1. Lobulated cystic mass involving the pancreatic tail has enlarged from previous MRI of 2011, suspicious for cystic pancreatic neoplasm. No communication with the pancreatic duct or aggressive characteristics demonstrated by noncontrast imaging. 2. 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 Stable splenic lesion. 3. Cholelithiasis without evidence of biliary dilatation. Motion and pneumobilia limited evaluation of the biliary system. 4. Grossly stable large right breast mass from previous MRI from 2011, supporting a benign etiology. 5. Sequela of chronic renal failure with atrophy kidneys, generalized soft tissue edema and probable changes of renal osteodystrophy. Electronically Signed   By: Richardean Sale M.D.   On: 08/11/2016  17:14   Medications: Infusions: . sodium chloride    . sodium chloride    . doxycycline (VIBRAMYCIN) IV Stopped (08/11/16 2319)    Scheduled Medications: . aspirin  81 mg Oral Daily  . Chlorhexidine Gluconate Cloth  6 each Topical Q0600  . cinacalcet  30 mg Oral Q supper  . citalopram  20 mg Oral Daily  . darbepoetin (ARANESP) injection - DIALYSIS  150 mcg Intravenous Q Wed-HD  . diltiazem  180 mg Oral Daily  . doxercalciferol  4.5 mcg Intravenous Q M,W,F-HD  . heparin  5,000 Units Subcutaneous Q8H  . insulin aspart  0-5 Units Subcutaneous QHS  . insulin aspart  0-9 Units Subcutaneous TID WC  . ipratropium-albuterol  3 mL Nebulization TID  . isosorbide mononitrate  60 mg Oral Daily  . latanoprost  1 drop Both Eyes QHS  . levothyroxine  25 mcg Oral QAC breakfast  . methylPREDNISolone (SOLU-MEDROL) injection  60 mg Intravenous Daily  . mirtazapine  15 mg Oral QHS  . multivitamin with minerals  1 tablet Oral Daily  . mupirocin ointment  1 application Nasal BID  . pantoprazole  40 mg Oral Daily  . sevelamer carbonate  2,400 mg Oral TID WC    have reviewed scheduled and prn medications.  Physical Exam: General: pleasant, confused on time Heart: RRR Lungs: cbs bilat Abdomen: obese, soft, non tender Extremities: pitting edema Dialysis Access: left upper arm AVF    08/12/2016,8:56 AM  LOS: 2 days       '

## 2016-08-13 LAB — PROCALCITONIN: Procalcitonin: 2.21 ng/mL

## 2016-08-13 LAB — BASIC METABOLIC PANEL
Anion gap: 9 (ref 5–15)
BUN: 40 mg/dL — AB (ref 6–20)
CALCIUM: 8.6 mg/dL — AB (ref 8.9–10.3)
CO2: 29 mmol/L (ref 22–32)
CREATININE: 5.98 mg/dL — AB (ref 0.44–1.00)
Chloride: 95 mmol/L — ABNORMAL LOW (ref 101–111)
GFR calc Af Amer: 7 mL/min — ABNORMAL LOW (ref >60)
GFR calc non Af Amer: 6 mL/min — ABNORMAL LOW (ref >60)
GLUCOSE: 121 mg/dL — AB (ref 65–99)
Potassium: 5.4 mmol/L — ABNORMAL HIGH (ref 3.5–5.1)
Sodium: 133 mmol/L — ABNORMAL LOW (ref 135–145)

## 2016-08-13 LAB — CBC
HEMATOCRIT: 29.5 % — AB (ref 36.0–46.0)
Hemoglobin: 9.5 g/dL — ABNORMAL LOW (ref 12.0–15.0)
MCH: 25.2 pg — ABNORMAL LOW (ref 26.0–34.0)
MCHC: 32.2 g/dL (ref 30.0–36.0)
MCV: 78.2 fL (ref 78.0–100.0)
PLATELETS: 173 10*3/uL (ref 150–400)
RBC: 3.77 MIL/uL — ABNORMAL LOW (ref 3.87–5.11)
RDW: 15.3 % (ref 11.5–15.5)
WBC: 8 10*3/uL (ref 4.0–10.5)

## 2016-08-13 LAB — GLUCOSE, CAPILLARY
Glucose-Capillary: 195 mg/dL — ABNORMAL HIGH (ref 65–99)
Glucose-Capillary: 150 mg/dL — ABNORMAL HIGH (ref 65–99)
Glucose-Capillary: 240 mg/dL — ABNORMAL HIGH (ref 65–99)

## 2016-08-13 MED ORDER — DOXERCALCIFEROL 4 MCG/2ML IV SOLN
INTRAVENOUS | Status: AC
  Filled 2016-08-13: qty 4

## 2016-08-13 MED ORDER — HEPARIN SODIUM (PORCINE) 1000 UNIT/ML DIALYSIS: 20.0000 [IU]/kg | INTRAMUSCULAR | Status: DC | PRN

## 2016-08-13 MED ORDER — SODIUM CHLORIDE 0.9% FLUSH
3.0000 mL | Freq: Two times a day (BID) | INTRAVENOUS | Status: DC
  Administered 2016-08-13 – 2016-08-14 (×2): 3 mL via INTRAVENOUS

## 2016-08-13 NOTE — Clinical Social Work Note (Signed)
CSW talked with Georgeanna Lea, [place] and Wellness Director for [employer] regarding patient's discharge on Saturday. Ms. Karlton Lemon had called the unit and spoken with patient's nurse and is aware of her discharge on Saturday. She requested that signed FL-2 be placed in discharge packet with other discharge information that will come with patient. CSW will provide this information to weekend CSW.  Winry Egnew Givens, MSW, LCSW Licensed Clinical Social Worker [place] [phone]

## 2016-08-13 NOTE — Procedures (Signed)
Patient was seen on dialysis and the procedure was supervised.  BFR 400  Via AVF BP is  188/78.   Patient appears to be tolerating treatment well  Kailene Steinhart A 08/13/2016

## 2016-08-13 NOTE — Progress Notes (Signed)
[facility]                                                                              Patient Demographics  [name], is a 83 y.o. female, DOB - 01-02-1926, ZHG:[id number]  Admit date - 08/10/2016   Admitting Physician Waldemar Dickens, MD  Outpatient Primary MD for the patient is [facility], Modena Nunnery, MD  Outpatient specialists:   LOS - 3  days   Medical records reviewed and are as summarized below:    Chief Complaint  Patient presents with  . Shortness of Breath       Brief summary   Patient is a 83 year old female with hypothyroidism, SVT, history of CVA, hyponatremia, hypertension, GERD, diabetes, depression, ESRD MWF, CHF, stomach CA presented to ED from PCPs office due to acute respiratory failure with hypoxia, O2 sats in 70s on 2 L O2 via nasal cannula. Patient reported gradual worsening of shortness of breath, wheezing, occasional productive cough with whitish phlegm. Also reported intermittent chest pain at rest.   Assessment & Plan    Principal Problem:   Acute on chronic respiratory failure (HCC) With hypoxia likely due to COPD and CHF exacerbation in the setting of ESRD -Improving sats 100% on room air Continue nebs, hemodialysis, doxycycline - Chest x-ray with mild left basilar atelectasis and minimal right basilar atelectasis, mild chronic interstitial lung disease  - CT chest showed atelectasis versus pneumonia of the left lung base  - Transitioned to oral prednisone - Hemodialysis per nephrology for volume management  - 2-D echoshowed EF of 60-65% with grade 1 diastolic dysfunction.   Active Problems:    ESRD (end stage renal disease) on dialysis [facility]) - Nephrology consulted, dialysis MWF - Discussed with Dr. Clover Mealy, plan for repeat extra dialysis tomorrow am then DC back to the facility  Abnormal CT chest - Reviewed CT chest with the patient, gallstones, ovoid cystic appearing lesion in the tail of pancreas representing  pseudocyst versus cystic neoplasm 4.7 x3.0cm recommended nonemergent MRI abdomen, right breast mass 4.3cm x 5.7cm. Discussed in detail with the patient regarding the findings on the CT chest. She had a normal mammogram in 11/2013. Patient requested MRI abdomen.  - MRI of the abdomen showed lobulated cystic mass involving the pancreatic tail has enlarged from previous MRI of the 2011 suspicious for cystic pancreatic neoplasm. No communication with the pancreatic duct or aggressive characteristics demonstrated by noncontrast imaging. Spoke with Dr. Benay Spice from oncology, recommended that patient be referred as an outpatient to Dr. Ardis Hughs from gastroenterology who can do EUS for biopsy for aspiration, further workup, CEA (if the patient and family is interested) by patient's PCP.  - Grossly stable large right breast mass from previous MRI from 2011 supporting a benign etiology. - All the results were discussed in detail with patient's daughter at the bedside     Anemia in chronic kidney disease - On ESA as outpatient,hemoglobin stable 9.5 - monitor closely     Essential hypertension - Currently stable, continue Imdur, Cardizem    Chronic pain -Currently stable, continue tramadol    Code Status: full CODE STATUS  DVT Prophylaxis:  SCD's Family Communication: Discussed in detail with the patient, all imaging results, lab results explained to the patient  Disposition Plan: DC back to facility in a.m. after dialysis   Time Spent in minutes   25 minutes  Procedures:  CT chest   Consultants:   Nephrology  Antimicrobials   doxycycline    Medications  Scheduled Meds: . aspirin  81 mg Oral Daily  . Chlorhexidine Gluconate Cloth  6 each Topical Q0600  . cinacalcet  30 mg Oral Q supper  . citalopram  20 mg Oral Daily  . darbepoetin (ARANESP) injection - DIALYSIS  150 mcg Intravenous Q Wed-HD  . diltiazem  180 mg Oral Daily  . doxercalciferol  4.5 mcg Intravenous Q M,W,F-HD  .  doxycycline  100 mg Oral Q12H  . heparin  5,000 Units Subcutaneous Q8H  . insulin aspart  0-5 Units Subcutaneous QHS  . insulin aspart  0-9 Units Subcutaneous TID WC  . isosorbide mononitrate  60 mg Oral Daily  . latanoprost  1 drop Both Eyes QHS  . levothyroxine  25 mcg Oral QAC breakfast  . mirtazapine  15 mg Oral QHS  . multivitamin with minerals  1 tablet Oral Daily  . mupirocin ointment  1 application Nasal BID  . pantoprazole  40 mg Oral Daily  . predniSONE  40 mg Oral Q breakfast  . sevelamer carbonate  2,400 mg Oral TID WC   Continuous Infusions:  PRN Meds:.acetaminophen **OR** acetaminophen, hydrALAZINE, ipratropium-albuterol, nitroGLYCERIN, ondansetron **OR** ondansetron (ZOFRAN) IV, polyethylene glycol, sevelamer carbonate, traMADol   Antibiotics   Anti-infectives    Start     Dose/Rate Route Frequency Ordered Stop   08/12/16 2200  doxycycline (VIBRA-TABS) tablet 100 mg     100 mg Oral Every 12 hours 08/12/16 1101     08/11/16 0045  doxycycline (VIBRAMYCIN) 100 mg in dextrose 5 % 250 mL IVPB  Status:  Discontinued     100 mg 125 mL/hr over 120 Minutes Intravenous Every 12 hours 08/11/16 0032 08/12/16 1101        Subjective:   [name] was seen and examined today. Patient seen and examined and hemodialysis, feeling better, shortness of breath improving, no chest pain. No wheezing. No fever or chills.   Patient denies dizziness, abdominal pain, N/V/D/C, new weakness, numbess, tingling. No acute events overnight.    Objective:   Vitals:   08/13/16 0830 08/13/16 0900 08/13/16 1000 08/13/16 1030  BP: (!) 176/74 (!) 188/78 (!) 146/50 (!) 137/55  Pulse: (!) 55 60 (!) 54 63  Resp:      Temp:      TempSrc:      SpO2:      Weight:      Height:        Intake/Output Summary (Last 24 hours) at 08/13/16 1336 Last data filed at 08/13/16 0865  Gross per 24 hour  Intake                0 ml  Output                0 ml  Net                0 ml     Wt Readings  from Last 3 Encounters:  08/13/16 83.6 kg (184 lb 4.9 oz)  08/10/16 84.8 kg (187 lb)  06/01/16 86.2 kg (190 lb)     Exam   General: Alert and oriented x 3, NAD  Eyes:  HEENT:   Cardiovascular: S1 S2 auscultated, no rubs, murmurs or gallops. Regular rate and rhythm. + pedal edema b/l  Respiratory: Decreased breath sounds at the bases, no wheezing  Gastrointestinal: Soft, nontender, nondistended, + bowel sounds  Ext: pedal edema and improving  Neuro:  no new deficits   Musculoskeletal: No digital cyanosis, clubbing  Skin: No rashes  Psych: Normal affect and demeanor, alert and oriented x3    Data Reviewed:  I have personally reviewed following labs and imaging studies  Micro Results Recent Results (from the past 240 hour(s))  MRSA PCR Screening     Status: Abnormal   Collection Time: 08/11/16 10:30 AM  Result Value Ref Range Status   MRSA by PCR POSITIVE (A) NEGATIVE Final    Comment:        The GeneXpert MRSA Assay (FDA approved for NASAL specimens only), is one component of a comprehensive MRSA colonization surveillance program. It is not intended to diagnose MRSA infection nor to guide or monitor treatment for MRSA infections. RESULT CALLED TO, READ BACK BY AND VERIFIED WITH: Marta Antu RN 12:10 08/11/16 ([email or web address])     Radiology Reports Dg Chest 2 View  Result Date: 08/10/2016 CLINICAL DATA:  Worsening shortness of breath and chest tightness for the past week. EXAM: CHEST  2 VIEW COMPARISON:  05/07/2016. FINDINGS: Stable elevated left hemidiaphragm. Mild left basilar atelectasis. Minimal right basilar atelectasis. Stable enlarged cardiac silhouette and tortuous and partially calcified thoracic aorta. Mild prominence of the interstitial markings without significant change. No pleural fluid seen. Diffuse osteopenia. Bilateral shoulder degenerative changes and superior migration of the right humeral head. IMPRESSION: 1. Mild left basilar atelectasis and minimal  right basilar atelectasis. 2. Stable cardiomegaly and mild chronic interstitial lung disease. 3. Bilateral shoulder degenerative changes and evidence of a large, chronic right rotator cuff tear. Electronically Signed   By: Claudie Revering M.D.   On: 08/10/2016 17:37   Ct Chest Wo Contrast  Result Date: 08/10/2016 CLINICAL DATA:  83 year old female with shortness of breath and wheezing. EXAM: CT CHEST WITHOUT CONTRAST TECHNIQUE: Multidetector CT imaging of the chest was performed following the standard protocol without IV contrast. COMPARISON:  Chest radiograph dated 08/10/2016 FINDINGS: Evaluation of this exam is limited in the absence of intravenous contrast. Cardiovascular: There is moderate cardiomegaly. No pericardial effusion. Multi vessel coronary vascular calcification with involvement of the LAD, RCA, and left circumflex artery. There is advanced atherosclerotic calcification of the thoracic aorta. No aneurysmal dilatation. Mild prominence of the central pulmonary vasculature, likely suggestive of underlying pulmonary hypertension. Mediastinum/Nodes: There is no hilar or mediastinal adenopathy. The esophagus is grossly unremarkable. Heterogeneous thyroid gland. Lungs/Pleura: There is eventration of the left hemidiaphragm. There is consolidative changes of the basal segment of the left lower lobe, likely atelectasis. Infiltrate is not entirely excluded. Clinical correlation is recommended. Right lung base subpleural thickening/ scarring. There is no pleural effusion or pneumothorax. The central airways are patent. Upper Abdomen: There is pneumobilia with air within the gallbladder. Multiple noncalcified stone noted in the gallbladder neck. There is a 4.7 x 3.0 cm lobulated low attenuating lesion in the left upper abdomen which is not well characterized. This lesion appears to arise from the tail of the pancreas and demonstrates a fluid density and may represent a pancreatic pseudocyst versus a cystic  neoplasm. Further characterization with nonemergent MRI is recommended. There is bilateral renal atrophy. Musculoskeletal: There is a 4.3 x 5.7 cm solid-appearing mass in the right breast. Correlation with clinical exam and  mammographic studies, as clinically indicated, recommended. Smaller partially calcified nodular densities noted adjacent to the larger mass. Multilevel degenerative changes of the spine. No acute osseous pathology. IMPRESSION: 1. Eventration of the left hemidiaphragm with left lung base consolidative changes, likely atelectasis. Pneumonia is not excluded. Clinical correlation is recommended. 2. Cardiomegaly with multi vessel coronary vascular calcification. 3.  Aortic Atherosclerosis (ICD10-I70.0). 4. Gallstones. There is pneumobilia, likely related to patulous ampulla of water. Clinical correlation is recommended. 5. Ovoid cystic appearing lesion in the tail of the pancreas may represent a pseudocyst or a cystic neoplasm. Further characterization with nonemergent MRI recommended. 6. Right breast mass. Correlation with clinical exam and mammographic studies, as clinically indicated, recommended. Electronically Signed   By: Anner Crete M.D.   On: 08/10/2016 22:21   Mr Abdomen Wo Contrast  Result Date: 08/11/2016 CLINICAL DATA:  Cystic lesion in the pancreatic tail on CT. End-stage renal disease on hemodialysis. Right breast mass and cholelithiasis. EXAM: MRI ABDOMEN WITHOUT CONTRAST TECHNIQUE: Multiplanar multisequence MR imaging was performed without the administration of intravenous contrast. COMPARISON:  Chest CT 08/10/2016.  Abdominal MRI 11/03/2009. FINDINGS: Despite efforts by the technologist and patient, moderate to marked motion artifact is present on today's exam and could not be eliminated. This reduces exam sensitivity and specificity. Thin section MRCP images are very limited. Lower chest: Elevation of the left hemidiaphragm and patchy bibasilar airspace opacities are similar  to recent CT. The heart is enlarged. Hepatobiliary: The liver demonstrates markedly decreased T2 signal, consistent with hemosiderosis/ iron overload. There is corresponding loss of signal on the gradient echo in phase images. No focal hepatic lesions are identified. Multiple gallstones are present associated with mild nonspecific gallbladder wall thickening. There is no significant biliary dilatation. Pneumobilia better seen on CT. Pancreas: Diffusely atrophied. Enlarging multilobular cystic mass in the pancreatic tail measures 5.1 x 3.4 x 3.1 cm. This previously measured 10 mm maximally. No solid components are identified on noncontrast imaging. There is no pancreatic ductal dilatation. Additional tiny cystic lesions more proximally in the pancreatic tail and body are unchanged. Spleen: Stable in size with a stable T2 hyperintense lesion medially measuring 2.5 cm. Diffusely decreased splenic T2 signal consistent with hemosiderosis/iron overload. Adrenals/Urinary Tract: Both adrenal glands appear normal. Both kidneys demonstrate marked cortical thinning consistent with chronic renal failure. There are small renal cysts bilaterally. No hydronephrosis. Stomach/Bowel: No evidence of bowel wall thickening, distention or surrounding inflammatory change. Vascular/Lymphatic: There are no enlarged abdominal lymph nodes. No acute vascular findings. Other: Generalized soft tissue edema. No significant ascites. There is a large lobulated right breast mass as seen on recent CT, measuring 5.5 x 4.2 x 3.9 cm. This is partially visible on previous MRI and has not markedly enlarged during this interval. Musculoskeletal: No acute or significant osseous findings. Mild spondylosis. Probable chronic changes of renal osteodystrophy involving the sacroiliac joints. IMPRESSION: 1. Lobulated cystic mass involving the pancreatic tail has enlarged from previous MRI of 2011, suspicious for cystic pancreatic neoplasm. No communication with  the pancreatic duct or aggressive characteristics demonstrated by noncontrast imaging. 2. 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 Stable splenic lesion. 3. Cholelithiasis without evidence of biliary dilatation. Motion and pneumobilia limited evaluation of the biliary system. 4. Grossly stable large right breast mass from previous MRI from 2011, supporting a benign etiology. 5. Sequela of chronic renal failure with atrophy kidneys, generalized soft tissue edema and probable changes of renal osteodystrophy. Electronically Signed   By: Caryl Comes.D.  On: 08/11/2016 17:14   Mr 3d Recon At Scanner  Result Date: 08/11/2016 CLINICAL DATA:  Cystic lesion in the pancreatic tail on CT. End-stage renal disease on hemodialysis. Right breast mass and cholelithiasis. EXAM: MRI ABDOMEN WITHOUT CONTRAST TECHNIQUE: Multiplanar multisequence MR imaging was performed without the administration of intravenous contrast. COMPARISON:  Chest CT 08/10/2016.  Abdominal MRI 11/03/2009. FINDINGS: Despite efforts by the technologist and patient, moderate to marked motion artifact is present on today's exam and could not be eliminated. This reduces exam sensitivity and specificity. Thin section MRCP images are very limited. Lower chest: Elevation of the left hemidiaphragm and patchy bibasilar airspace opacities are similar to recent CT. The heart is enlarged. Hepatobiliary: The liver demonstrates markedly decreased T2 signal, consistent with hemosiderosis/ iron overload. There is corresponding loss of signal on the gradient echo in phase images. No focal hepatic lesions are identified. Multiple gallstones are present associated with mild nonspecific gallbladder wall thickening. There is no significant biliary dilatation. Pneumobilia better seen on CT. Pancreas: Diffusely atrophied. Enlarging multilobular cystic mass in the pancreatic tail measures 5.1 x 3.4 x 3.1 cm. This previously measured 10 mm maximally. No  solid components are identified on noncontrast imaging. There is no pancreatic ductal dilatation. Additional tiny cystic lesions more proximally in the pancreatic tail and body are unchanged. Spleen: Stable in size with a stable T2 hyperintense lesion medially measuring 2.5 cm. Diffusely decreased splenic T2 signal consistent with hemosiderosis/iron overload. Adrenals/Urinary Tract: Both adrenal glands appear normal. Both kidneys demonstrate marked cortical thinning consistent with chronic renal failure. There are small renal cysts bilaterally. No hydronephrosis. Stomach/Bowel: No evidence of bowel wall thickening, distention or surrounding inflammatory change. Vascular/Lymphatic: There are no enlarged abdominal lymph nodes. No acute vascular findings. Other: Generalized soft tissue edema. No significant ascites. There is a large lobulated right breast mass as seen on recent CT, measuring 5.5 x 4.2 x 3.9 cm. This is partially visible on previous MRI and has not markedly enlarged during this interval. Musculoskeletal: No acute or significant osseous findings. Mild spondylosis. Probable chronic changes of renal osteodystrophy involving the sacroiliac joints. IMPRESSION: 1. Lobulated cystic mass involving the pancreatic tail has enlarged from previous MRI of 2011, suspicious for cystic pancreatic neoplasm. No communication with the pancreatic duct or aggressive characteristics demonstrated by noncontrast imaging. 2. 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 Stable splenic lesion. 3. Cholelithiasis without evidence of biliary dilatation. Motion and pneumobilia limited evaluation of the biliary system. 4. Grossly stable large right breast mass from previous MRI from 2011, supporting a benign etiology. 5. Sequela of chronic renal failure with atrophy kidneys, generalized soft tissue edema and probable changes of renal osteodystrophy. Electronically Signed   By: Richardean Sale M.D.   On: 08/11/2016  17:14    Lab Data:  CBC:  Recent Labs Lab 08/10/16 1944 08/11/16 0004 08/11/16 0608 08/13/16 0508  WBC 6.0 4.4 5.0 8.0  HGB 8.9* 8.7* 9.4* 9.5*  HCT 27.8* 26.9* 29.9* 29.5*  MCV 78.5 77.1* 78.7 78.2  PLT 166 151 172 235   Basic Metabolic Panel:  Recent Labs Lab 08/10/16 1751 08/11/16 0006 08/11/16 0608 08/12/16 0616 08/13/16 0508  NA 135 129* 134* 135 133*  K 4.7 5.4* 5.3* 3.9 5.4*  CL 95* 93* 95* 95* 95*  CO2 31 27 30  32 29  GLUCOSE 140* 203* 146* 112* 121*  BUN 37* 54* 42* 14 40*  CREATININE 6.96* 8.61* 7.66* 3.83* 5.98*  CALCIUM 8.8* 8.5* 8.5* 8.7* 8.6*  MG  --   --  2.2  --   --   PHOS  --  2.9 3.7  --   --    GFR: Estimated Creatinine Clearance: 7.1 mL/min (A) (by C-G formula based on SCr of 5.98 mg/dL (H)). Liver Function Tests:  Recent Labs Lab 08/11/16 0006  ALBUMIN 3.4*   No results for input(s): LIPASE, AMYLASE in the last 168 hours. No results for input(s): AMMONIA in the last 168 hours. Coagulation Profile: No results for input(s): INR, PROTIME in the last 168 hours. Cardiac Enzymes:  Recent Labs Lab 08/11/16 0153 08/11/16 0608 08/11/16 1202  TROPONINI 0.05* 0.04* 0.03*   BNP (last 3 results) No results for input(s): PROBNP in the last 8760 hours. HbA1C:  Recent Labs  08/11/16 1249  HGBA1C 5.3   CBG:  Recent Labs Lab 08/12/16 0556 08/12/16 0814 08/12/16 1238 08/12/16 1654 08/12/16 2057  GLUCAP 101* 166* 213* 213* 160*   Lipid Profile: No results for input(s): CHOL, HDL, LDLCALC, TRIG, CHOLHDL, LDLDIRECT in the last 72 hours. Thyroid Function Tests: No results for input(s): TSH, T4TOTAL, FREET4, T3FREE, THYROIDAB in the last 72 hours. Anemia Panel: No results for input(s): VITAMINB12, FOLATE, FERRITIN, TIBC, IRON, RETICCTPCT in the last 72 hours. Urine analysis:    Component Value Date/Time   COLORURINE YELLOW 01/31/2011 2039   APPEARANCEUR CLEAR 01/31/2011 2039   LABSPEC 1.010 01/31/2011 2039   PHURINE 6.5  01/31/2011 2039   GLUCOSEU NEGATIVE 01/31/2011 2039   HGBUR NEGATIVE 01/31/2011 2039   HGBUR trace-lysed 09/05/2006 0000   BILIRUBINUR NEGATIVE 01/31/2011 2039   [place] NEGATIVE 01/31/2011 2039   PROTEINUR 30 (A) 01/31/2011 2039   UROBILINOGEN 0.2 01/31/2011 2039   NITRITE NEGATIVE 01/31/2011 2039   LEUKOCYTESUR NEGATIVE 01/31/2011 2039     Ripudeep Rai M.D. [facility] 08/13/2016, 1:36 PM  Pager: [phone] Between 7am to 7pm - call Pager - [phone]  After 7pm go to [email or web address] - password [email or web address]  Call night coverage person covering after 7pm

## 2016-08-13 NOTE — Clinical Social Work Note (Signed)
Clinical Social Work Assessment  Patient Details  Name: [name] MRN: [id number] Date of Birth: 1925-10-05  Date of referral:  08/13/16               Reason for consult:  Facility Placement                Permission sought to share information with:  Family Supports Permission granted to share information::  Yes, Verbal Permission Granted  Name:: Dub Amis, Zack Seal, and daughter [name]      Agency::     Relationship::  Daughter - Nutritional therapist Information:  [phone] Vidal Schwalbe) and [phone] [name])   Housing/Transportation Living arrangements for the past 2 months:  [facility] (Patient has been at [facility] for 6 years) Source of Information:  Patient Patient Interpreter Needed:  None Criminal Activity/Legal Involvement Pertinent to Current Situation/Hospitalization:  No - Comment as needed Significant Relationships:  Adult Children Lives with:  Facility Resident ([facility]) Do you feel safe going back to the place where you live?  Yes Need for family participation in patient care:  Yes (Comment)  Care giving concerns:  Patient expressed no concerns regarding care at ALF   Social Worker assessment / plan:  CSW talked with the patient at bedside regarding her discharge disposition. [name] was sitting up in the bed and was alert, oriented and willing to talk with CSW. Patient confirmed that she is from [facility] and has been there for 6 years. During the conversation, CSW gleaned that her children are caring and involved. Per patient, they will provide transportation for her back to ALF.     Employment status:  Retired Nurse, adult, Medicaid In [place] PT Recommendations:  Not assessed at this time [place] / Referral to community resources:  Other (Comment Required) (None needed or requested as patient is from an ALF)  Patient/Family's Response to care:  Patient did not express any concerns regarding her  care during hospitalization.  Patient/Family's Understanding of and Emotional Response to Diagnosis, Current Treatment, and Prognosis:  Not discussed.  Emotional Assessment Appearance:  Appears younger than stated age Attitude/Demeanor/Rapport:  Other (Appropriate) Affect (typically observed):  Pleasant, Appropriate Orientation:  Oriented to Self, Oriented to Place, Oriented to  Time, Oriented to Situation Alcohol / Substance use:  Never Used Psych involvement (Current and /or in the community):  No (Comment)  Discharge Needs  Concerns to be addressed:  Discharge Planning Concerns Readmission within the last 30 days:  No Current discharge risk:  None Barriers to Discharge:  No Barriers Identified   [name], LCSW 08/13/2016, 2:59 PM

## 2016-08-13 NOTE — Consult Note (Signed)
   [facility] CM Inpatient Consult   08/13/2016  [name] 05-05-1925 [id number]   Met with the patient and daughter in law, Tye [place],  at the bedside.  Patient states she lives at [facility] in [place] and has been there nearly 6 years.  Patient was assessed for HF educations and needs.  She states she has full service at her facility.  [facility] Care Management will not be appropriate at this time. She manages her diet as best for her HF and kidney disease she can,  and she goes to dialysis 3 times a week.  She verbalized fluid overload and how to manage when her facility serves certain foods only. She endorses Dr. Vic Blackbird as her primary care provider. Provided the patient with the brochure with the 24 hour nurse advise line.  For questions,  Natividad Brood, RN BSN [facility] Liaison  [phone] business mobile phone Toll free office [phone]

## 2016-08-13 NOTE — Progress Notes (Signed)
Subjective:  Seen on HD this AM- in good spirits  Objective Vital signs in last 24 hours: Vitals:   08/13/16 0626 08/13/16 0751 08/13/16 0756 08/13/16 0830  BP: (!) 195/70 (!) 156/77 (!) 180/77 (!) 176/74  Pulse: 64 (!) 59 61 (!) 55  Resp: 18 18    Temp: 98.3 F (36.8 C) 98 F (36.7 C)    TempSrc: Oral Oral    SpO2: 100% 100%    Weight:  83.6 kg (184 lb 4.9 oz)    Height:       Weight change: -3.447 kg (-7 lb 9.6 oz)  Intake/Output Summary (Last 24 hours) at 08/13/16 0915 Last data filed at 08/13/16 1287  Gross per 24 hour  Intake                0 ml  Output                0 ml  Net                0 ml    Dialyzes at [facility]  EDW 84.5. 350 blood flow rate 3 and half hours 15-gauge needles HD Bath 2 potassium/2.5 calcium, Heparin yes. Access AV fistula on the left is on Epogen 6200 every HD, Hectorol 4.5 g every HD, Sensipar 30 daily and Renvela   Assessment/Plan: 83 year old black female with ESRD. Presents with shortness of breath 1 shortness of breath- feel it could be multifactorial. She normally wears oxygen at baseline. Tells me that she thinks she has lost weight but her dry weight has not been decreased. Therefore, could have element of CHF. challenged fluid removal with dialysis overnight and that went well, repeating today. She also appears to be more anemic than baseline and this could be contributing. 2 ESRD: Normally Monday Wednesday Friday at [facility]. Plan for dialysis today on schedule, want to do HD tomorrow as well, pre discharge ? with new EDW for OP unit on Monday- pt is willing 3 Hypertension: Blood pressure is up. This is a good scenario for being able to pull fluid. She agrees to a temporary increase in dialysis time from 3 and half to 4 hours in an effort to remove more fluid.  On no blood pressure medications as an outpatient, another clue that she is overloaded and that is what is driving high BP 4. Anemia of ESRD: Is on an ESA as outpatient. It  seems however that her hemoglobin has dropped fairly acutely. We will increase ESA and monitor- hgb 9.5 5. Metabolic Bone Disease: Will continue home doses of Sensipar, Hectorol and also continue Renvela from phos binding- labs WNL   Charmion Hapke A    Labs: Basic Metabolic Panel:  Recent Labs Lab 08/11/16 0006 08/11/16 0608 08/12/16 0616 08/13/16 0508  NA 129* 134* 135 133*  K 5.4* 5.3* 3.9 5.4*  CL 93* 95* 95* 95*  CO2 27 30 32 29  GLUCOSE 203* 146* 112* 121*  BUN 54* 42* 14 40*  CREATININE 8.61* 7.66* 3.83* 5.98*  CALCIUM 8.5* 8.5* 8.7* 8.6*  PHOS 2.9 3.7  --   --    Liver Function Tests:  Recent Labs Lab 08/11/16 0006  ALBUMIN 3.4*   No results for input(s): LIPASE, AMYLASE in the last 168 hours. No results for input(s): AMMONIA in the last 168 hours. CBC:  Recent Labs Lab 08/10/16 1944 08/11/16 0004 08/11/16 0608 08/13/16 0508  WBC 6.0 4.4 5.0 8.0  HGB 8.9* 8.7* 9.4* 9.5*  HCT  27.8* 26.9* 29.9* 29.5*  MCV 78.5 77.1* 78.7 78.2  PLT 166 151 172 173   Cardiac Enzymes:  Recent Labs Lab 08/11/16 0153 08/11/16 0608 08/11/16 1202  TROPONINI 0.05* 0.04* 0.03*   CBG:  Recent Labs Lab 08/12/16 0556 08/12/16 0814 08/12/16 1238 08/12/16 1654 08/12/16 2057  GLUCAP 101* 166* 213* 213* 160*    Iron Studies: No results for input(s): IRON, TIBC, TRANSFERRIN, FERRITIN in the last 72 hours. Studies/Results: Mr Abdomen Wo Contrast  Result Date: 08/11/2016 CLINICAL DATA:  Cystic lesion in the pancreatic tail on CT. End-stage renal disease on hemodialysis. Right breast mass and cholelithiasis. EXAM: MRI ABDOMEN WITHOUT CONTRAST TECHNIQUE: Multiplanar multisequence MR imaging was performed without the administration of intravenous contrast. COMPARISON:  Chest CT 08/10/2016.  Abdominal MRI 11/03/2009. FINDINGS: Despite efforts by the technologist and patient, moderate to marked motion artifact is present on today's exam and could not be eliminated. This  reduces exam sensitivity and specificity. Thin section MRCP images are very limited. Lower chest: Elevation of the left hemidiaphragm and patchy bibasilar airspace opacities are similar to recent CT. The heart is enlarged. Hepatobiliary: The liver demonstrates markedly decreased T2 signal, consistent with hemosiderosis/ iron overload. There is corresponding loss of signal on the gradient echo in phase images. No focal hepatic lesions are identified. Multiple gallstones are present associated with mild nonspecific gallbladder wall thickening. There is no significant biliary dilatation. Pneumobilia better seen on CT. Pancreas: Diffusely atrophied. Enlarging multilobular cystic mass in the pancreatic tail measures 5.1 x 3.4 x 3.1 cm. This previously measured 10 mm maximally. No solid components are identified on noncontrast imaging. There is no pancreatic ductal dilatation. Additional tiny cystic lesions more proximally in the pancreatic tail and body are unchanged. Spleen: Stable in size with a stable T2 hyperintense lesion medially measuring 2.5 cm. Diffusely decreased splenic T2 signal consistent with hemosiderosis/iron overload. Adrenals/Urinary Tract: Both adrenal glands appear normal. Both kidneys demonstrate marked cortical thinning consistent with chronic renal failure. There are small renal cysts bilaterally. No hydronephrosis. Stomach/Bowel: No evidence of bowel wall thickening, distention or surrounding inflammatory change. Vascular/Lymphatic: There are no enlarged abdominal lymph nodes. No acute vascular findings. Other: Generalized soft tissue edema. No significant ascites. There is a large lobulated right breast mass as seen on recent CT, measuring 5.5 x 4.2 x 3.9 cm. This is partially visible on previous MRI and has not markedly enlarged during this interval. Musculoskeletal: No acute or significant osseous findings. Mild spondylosis. Probable chronic changes of renal osteodystrophy involving the  sacroiliac joints. IMPRESSION: 1. Lobulated cystic mass involving the pancreatic tail has enlarged from previous MRI of 2011, suspicious for cystic pancreatic neoplasm. No communication with the pancreatic duct or aggressive characteristics demonstrated by noncontrast imaging. 2. 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 Stable splenic lesion. 3. Cholelithiasis without evidence of biliary dilatation. Motion and pneumobilia limited evaluation of the biliary system. 4. Grossly stable large right breast mass from previous MRI from 2011, supporting a benign etiology. 5. Sequela of chronic renal failure with atrophy kidneys, generalized soft tissue edema and probable changes of renal osteodystrophy. Electronically Signed   By: Richardean Sale M.D.   On: 08/11/2016 17:14   Mr 3d Recon At Scanner  Result Date: 08/11/2016 CLINICAL DATA:  Cystic lesion in the pancreatic tail on CT. End-stage renal disease on hemodialysis. Right breast mass and cholelithiasis. EXAM: MRI ABDOMEN WITHOUT CONTRAST TECHNIQUE: Multiplanar multisequence MR imaging was performed without the administration of intravenous contrast. COMPARISON:  Chest CT  08/10/2016.  Abdominal MRI 11/03/2009. FINDINGS: Despite efforts by the technologist and patient, moderate to marked motion artifact is present on today's exam and could not be eliminated. This reduces exam sensitivity and specificity. Thin section MRCP images are very limited. Lower chest: Elevation of the left hemidiaphragm and patchy bibasilar airspace opacities are similar to recent CT. The heart is enlarged. Hepatobiliary: The liver demonstrates markedly decreased T2 signal, consistent with hemosiderosis/ iron overload. There is corresponding loss of signal on the gradient echo in phase images. No focal hepatic lesions are identified. Multiple gallstones are present associated with mild nonspecific gallbladder wall thickening. There is no significant biliary dilatation.  Pneumobilia better seen on CT. Pancreas: Diffusely atrophied. Enlarging multilobular cystic mass in the pancreatic tail measures 5.1 x 3.4 x 3.1 cm. This previously measured 10 mm maximally. No solid components are identified on noncontrast imaging. There is no pancreatic ductal dilatation. Additional tiny cystic lesions more proximally in the pancreatic tail and body are unchanged. Spleen: Stable in size with a stable T2 hyperintense lesion medially measuring 2.5 cm. Diffusely decreased splenic T2 signal consistent with hemosiderosis/iron overload. Adrenals/Urinary Tract: Both adrenal glands appear normal. Both kidneys demonstrate marked cortical thinning consistent with chronic renal failure. There are small renal cysts bilaterally. No hydronephrosis. Stomach/Bowel: No evidence of bowel wall thickening, distention or surrounding inflammatory change. Vascular/Lymphatic: There are no enlarged abdominal lymph nodes. No acute vascular findings. Other: Generalized soft tissue edema. No significant ascites. There is a large lobulated right breast mass as seen on recent CT, measuring 5.5 x 4.2 x 3.9 cm. This is partially visible on previous MRI and has not markedly enlarged during this interval. Musculoskeletal: No acute or significant osseous findings. Mild spondylosis. Probable chronic changes of renal osteodystrophy involving the sacroiliac joints. IMPRESSION: 1. Lobulated cystic mass involving the pancreatic tail has enlarged from previous MRI of 2011, suspicious for cystic pancreatic neoplasm. No communication with the pancreatic duct or aggressive characteristics demonstrated by noncontrast imaging. 2. 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 Stable splenic lesion. 3. Cholelithiasis without evidence of biliary dilatation. Motion and pneumobilia limited evaluation of the biliary system. 4. Grossly stable large right breast mass from previous MRI from 2011, supporting a benign etiology. 5.  Sequela of chronic renal failure with atrophy kidneys, generalized soft tissue edema and probable changes of renal osteodystrophy. Electronically Signed   By: Richardean Sale M.D.   On: 08/11/2016 17:14   Medications: Infusions: . sodium chloride    . sodium chloride      Scheduled Medications: . aspirin  81 mg Oral Daily  . Chlorhexidine Gluconate Cloth  6 each Topical Q0600  . cinacalcet  30 mg Oral Q supper  . citalopram  20 mg Oral Daily  . darbepoetin (ARANESP) injection - DIALYSIS  150 mcg Intravenous Q Wed-HD  . diltiazem  180 mg Oral Daily  . doxercalciferol  4.5 mcg Intravenous Q M,W,F-HD  . doxycycline  100 mg Oral Q12H  . heparin  5,000 Units Subcutaneous Q8H  . insulin aspart  0-5 Units Subcutaneous QHS  . insulin aspart  0-9 Units Subcutaneous TID WC  . isosorbide mononitrate  60 mg Oral Daily  . latanoprost  1 drop Both Eyes QHS  . levothyroxine  25 mcg Oral QAC breakfast  . mirtazapine  15 mg Oral QHS  . multivitamin with minerals  1 tablet Oral Daily  . mupirocin ointment  1 application Nasal BID  . pantoprazole  40 mg Oral Daily  .  predniSONE  40 mg Oral Q breakfast  . sevelamer carbonate  2,400 mg Oral TID WC    have reviewed scheduled and prn medications.  Physical Exam: General: pleasant, confused at times Heart: RRR Lungs: cbs bilat Abdomen: obese, soft, non tender Extremities: pitting edema Dialysis Access: left upper arm AVF    08/13/2016,9:15 AM  LOS: 3 days       '

## 2016-08-14 DIAGNOSIS — R0602 Shortness of breath: Secondary | ICD-10-CM

## 2016-08-14 DIAGNOSIS — K869 Disease of pancreas, unspecified: Secondary | ICD-10-CM

## 2016-08-14 LAB — CBC
HCT: 31.7 % — ABNORMAL LOW (ref 36.0–46.0)
HEMOGLOBIN: 10.2 g/dL — AB (ref 12.0–15.0)
MCH: 25.2 pg — AB (ref 26.0–34.0)
MCHC: 32.2 g/dL (ref 30.0–36.0)
MCV: 78.5 fL (ref 78.0–100.0)
PLATELETS: 194 10*3/uL (ref 150–400)
RBC: 4.04 MIL/uL (ref 3.87–5.11)
RDW: 15.7 % — ABNORMAL HIGH (ref 11.5–15.5)
WBC: 7.3 10*3/uL (ref 4.0–10.5)

## 2016-08-14 LAB — RENAL FUNCTION PANEL
Albumin: 3.3 g/dL — ABNORMAL LOW (ref 3.5–5.0)
Anion gap: 10 (ref 5–15)
BUN: 35 mg/dL — AB (ref 6–20)
CALCIUM: 8.8 mg/dL — AB (ref 8.9–10.3)
CO2: 29 mmol/L (ref 22–32)
Chloride: 95 mmol/L — ABNORMAL LOW (ref 101–111)
Creatinine, Ser: 5 mg/dL — ABNORMAL HIGH (ref 0.44–1.00)
GFR calc non Af Amer: 7 mL/min — ABNORMAL LOW (ref >60)
GFR, EST AFRICAN AMERICAN: 8 mL/min — AB (ref >60)
GLUCOSE: 159 mg/dL — AB (ref 65–99)
Phosphorus: 2.6 mg/dL (ref 2.5–4.6)
Potassium: 4.5 mmol/L (ref 3.5–5.1)
SODIUM: 134 mmol/L — AB (ref 135–145)

## 2016-08-14 LAB — GLUCOSE, CAPILLARY: GLUCOSE-CAPILLARY: 145 mg/dL — AB (ref 65–99)

## 2016-08-14 MED ORDER — PREDNISONE 10 MG PO TABS: ORAL_TABLET | ORAL | 0 refills | Status: DC

## 2016-08-14 MED ORDER — TRAMADOL HCL 50 MG PO TABS: 50.0000 mg | ORAL_TABLET | Freq: Two times a day (BID) | ORAL | 0 refills | Status: DC | PRN

## 2016-08-14 MED ORDER — DOXYCYCLINE HYCLATE 100 MG PO TABS: 100.0000 mg | ORAL_TABLET | Freq: Two times a day (BID) | ORAL | 0 refills | Status: DC

## 2016-08-14 MED ORDER — HEPARIN SODIUM (PORCINE) 1000 UNIT/ML DIALYSIS: 20.0000 [IU]/kg | INTRAMUSCULAR | Status: DC | PRN

## 2016-08-14 MED ORDER — POLYETHYLENE GLYCOL 3350 17 GM/SCOOP PO POWD
17.0000 g | ORAL | 2 refills | Status: DC | PRN
Start: 2016-08-14 — End: 2017-05-24

## 2016-08-14 NOTE — Discharge Summary (Addendum)
Physician Discharge Summary   Patient ID: [name] MRN: [id number] DOB/AGE: 04-28-1925 83 y.o.  Admit date: 08/10/2016 Discharge date: 08/14/2016  Primary Care Physician:  Alycia Rossetti, MD  Discharge Diagnoses:    . Acute on chronic respiratory failure ([facility]) . Anemia in chronic kidney disease ESRD on hemodialysis Abnormal CT chest Chronic pain Essential hypertension   Consults:  Nephrology   Recommendations for Outpatient Follow-up:  1. Please repeat CBC/BMET at next visit 2. MRI of the abdomen showed lobulated cystic mass involving the pancreatic tail has enlarged from previous MRI of the 2011 suspicious for cystic pancreatic neoplasm. No communication with the pancreatic duct or aggressive characteristics demonstrated by noncontrast imaging. Spoke with Dr. Benay Spice from oncology, recommended that patient be referred as an outpatient to Dr. Ardis Hughs from gastroenterology who can do EUS for biopsy for aspiration, further workup, CEA (if the patient and family is interested) by patient's PCP.    DIET: Heart healthy diet    Allergies:  No Known Allergies   DISCHARGE MEDICATIONS: Current Discharge Medication List    START taking these medications   Details  doxycycline (VIBRA-TABS) 100 MG tablet Take 1 tablet (100 mg total) by mouth 2 (two) times daily. X 6 days Qty: 12 tablet, Refills: 0    predniSONE (DELTASONE) 10 MG tablet Prednisone dosing: Take  Prednisone 30mg  (3 tabs) x 3 days, then 20mg  (2 tabs) x 3days, then 10mg  (1 tab) x 3days, then OFF. Qty: 18 tablet, Refills: 0      CONTINUE these medications which have CHANGED   Details  polyethylene glycol powder (MIRALAX) powder Take 17 g by mouth as needed for mild constipation. Hold for diarrhea Qty: 250 g, Refills: 2    traMADol (ULTRAM) 50 MG tablet Take 1 tablet (50 mg total) by mouth every 12 (twelve) hours as needed. Qty: 10 tablet, Refills: 0      CONTINUE these medications which have NOT CHANGED    Details  albuterol (PROVENTIL) (2.5 MG/3ML) 0.083% nebulizer solution USE 1 VIAL IN NEBULIZER 3 TIMES DAILY AS NEEDED FOR WHEEZING/SHORTNESS OF BREATH. Qty: 360 mL, Refills: 0    aspirin 81 MG chewable tablet Chew by mouth daily.    bisacodyl (DULCOLAX) 10 MG suppository 1 suppository rectally  Daily every 3 days as needed for constipation Qty: 12 suppository, Refills: 2    citalopram (CELEXA) 20 MG tablet TAKE (1) TABLET BY MOUTH ONCE IN THE MORNING. Qty: 31 tablet, Refills: 11    diltiazem (CARDIZEM CD) 180 MG 24 hr capsule TAKE 1 CAPSULE BY MOUTH ONCE A DAY. Qty: 30 capsule, Refills: 11    docusate sodium (COLACE) 100 MG capsule Take 100 mg by mouth 2 (two) times daily.    guaiFENesin-dextromethorphan (ROBITUSSIN DM) 100-10 MG/5ML syrup TAKE ONE TEASPOONFUL BY MOUTH EVERY 4 HOURS AS NEEDED FOR COUGH Qty: 120 mL, Refills: 2    ipratropium (ATROVENT) 0.02 % nebulizer solution USE 1 VIAL IN NEBULIZER 3 TIMES DAILY AS NEEDED FOR WHEEZING/SHORTNESS OF BREATH. Qty: 360 mL, Refills: 0    isosorbide mononitrate (IMDUR) 60 MG 24 hr tablet TAKE (1) TABLET BY MOUTH ONCE DAILY. Qty: 31 tablet, Refills: 11    levothyroxine (SYNTHROID, LEVOTHROID) 25 MCG tablet TAKE (1) TABLET BY MOUTH ONCE DAILY BEFORE BREAKFAST. Qty: 31 tablet, Refills: 11    lidocaine-prilocaine (EMLA) cream APPLY TOPICALLY TO LEFT UPPER ARM 1 HOUR PRIOR TO DIALYSIS ON MONDAY,WEDNESDAY & FRIDAYS AS NEEDED FOR PAIN. WRAP WITH PLASTIC WRAP AFTER A Qty: 30 g, Refills:  11    mirtazapine (REMERON) 15 MG tablet TAKE 1/2 TABLET (7.5MG ) BY MOUTH AT BEDTIME FOR MOOD/APPETITE. Qty: 16 tablet, Refills: 11    Multiple Vitamin (TAB-A-VITE) TABS Take 1 tablet by mouth daily.    nitroGLYCERIN (NITROSTAT) 0.4 MG SL tablet DISSOLVE 1 TABLET UNDER THE TONGUE EVERY 5 MINUTES X2 DOSES THEN TO ER Qty: 25 tablet, Refills: 0    pantoprazole (PROTONIX) 40 MG tablet TAKE (1) TABLET BY MOUTH ONCE DAILY. Qty: 31 tablet, Refills: 11     polyvinyl alcohol (LIQUIFILM TEARS) 1.4 % ophthalmic solution Place 1 drop into both eyes as needed for dry eyes.     SENSIPAR 30 MG tablet TAKE 1 TABLET BY MOUTH ONCE DAILY AFTER THE EVENING MEAL Qty: 31 tablet, Refills: PRN    sevelamer (RENVELA) 800 MG tablet Take 800-2,400 mg by mouth 5 (five) times daily. Take 3 tablets  three times daily with meals and take 1 tablet twice daily with snacks    Travoprost, BAK Free, (TRAVATAN) 0.004 % SOLN ophthalmic solution Place 1 drop into both eyes at bedtime. Qty: 5 mL, Refills: 11    VENTOLIN HFA 108 (90 Base) MCG/ACT inhaler INHALE (2) PUFFS EVERY 4 HOURS AS NEEDED FOR WHEEZING OR SHORTNESS OFBREATH(COUGH). Qty: 18 g, Refills: 11    Lancets MISC Use as directed to monitor FSBS 1x daily x2 weeks, then 1x daily PRN S/Sx hypoglycemia. Dx: E11.9. Qty: 50 each, Refills: 1    OXYGEN Inhale 2 L into the lungs daily as needed (shortness of breath).         Brief H and P: For complete details please refer to admission H and P, but in brief Patient is a 83 year old female with hypothyroidism, SVT, history of CVA, hyponatremia, hypertension, GERD, diabetes, depression, ESRD MWF, CHF, stomach CA presented to ED from PCPs office due to acute respiratory failure with hypoxia, O2 sats in 70s on 2 L O2 via nasal cannula. Patient reported gradual worsening of shortness of breath, wheezing, occasional productive cough with whitish phlegm. Also reported intermittent chest pain at rest.    Hospital Course:   Acute on chronic respiratory failure ([place]) With hypoxia likely due to COPD and CHF exacerbation in the setting of ESRD - Patient was placed on scheduled nebulizer treatments, doxycycline, IV Solu-Medrol. Nephrology was consulted for hemodialysis for volume management. - Chest x-ray with mild left basilar atelectasis and minimal right basilar atelectasis, mild chronic interstitial lung disease  - CT chest showed atelectasis versus pneumonia of the left  lung base  - Transitioned to oral prednisone with taper - 2-D echoshowed EF of 60-65% with grade 1 diastolic dysfunction.      ESRD (end stage renal disease) on dialysis [facility]) - Nephrology consulted, dialysis MWF - Discussed with Dr. Clover Mealy, plan for repeat extra dialysis tomorrow am then DC back to the facility  Abnormal CT chest - Reviewed CT chest with the patient, gallstones, ovoid cystic appearing lesion in the tail of pancreas representing pseudocyst versus cystic neoplasm 4.7 x3.0cm recommended nonemergent MRI abdomen, right breast mass 4.3cm x 5.7cm. Discussed in detail with the patient regarding the findings on the CT chest. She had a normal mammogram in 11/2013. Patient requested MRI abdomen.  - MRI of the abdomen showed lobulated cystic mass involving the pancreatic tail has enlarged from previous MRI of the 2011 suspicious for cystic pancreatic neoplasm. No communication with the pancreatic duct or aggressive characteristics demonstrated by noncontrast imaging. Spoke with Dr. Benay Spice from oncology, recommended that patient be  referred as an outpatient to Dr. Ardis Hughs from gastroenterology who can do EUS for biopsy for aspiration, further workup, CEA (if the patient and family is interested) by patient's PCP.  - Grossly stable large right breast mass from previous MRI from 2011 supporting a benign etiology. - All the results were discussed in detail with patient's daughter, Ms Javier Glazier during the hospitalization and at the time of discharge. She will discuss with the patient and the PCP regarding the cystic pancreatic finding and decision if further workup will be planned.      Anemia in chronic kidney disease - On ESA as outpatient, hemoglobin stable at 10.2 - monitor closely     Essential hypertension - Currently stable, continue Imdur, Cardizem    Chronic pain -Currently stable, continue tramadol    Day of Discharge BP 128/64 (BP Location: Right Arm)   Pulse 61   Temp  97.7 F (36.5 C) (Oral)   Resp 18   Ht 5\' 6"  (1.676 m)   Wt 75 kg (165 lb 5.5 oz) Comment: stood to scale   LMP 12/24/2010   SpO2 100%   BMI 26.69 kg/m   Physical Exam: General: Alert and awake oriented x3 not in any acute distress. HEENT: anicteric sclera, pupils reactive to light and accommodation CVS: S1-S2 clear no murmur rubs or gallops Chest: clear to auscultation bilaterally, no wheezing rales or rhonchi Abdomen: soft nontender, nondistended, normal bowel sounds Extremities: no cyanosis, clubbing or edema noted bilaterally Neuro: Cranial nerves II-XII intact, no focal neurological deficits   The results of significant diagnostics from this hospitalization (including imaging, microbiology, ancillary and laboratory) are listed below for reference.    LAB RESULTS: Basic Metabolic Panel:  Recent Labs Lab 08/11/16 0608  08/13/16 0508 08/14/16 0625  NA 134*  < > 133* 134*  K 5.3*  < > 5.4* 4.5  CL 95*  < > 95* 95*  CO2 30  < > 29 29  GLUCOSE 146*  < > 121* 159*  BUN 42*  < > 40* 35*  CREATININE 7.66*  < > 5.98* 5.00*  CALCIUM 8.5*  < > 8.6* 8.8*  MG 2.2  --   --   --   PHOS 3.7  --   --  2.6  < > = values in this interval not displayed. Liver Function Tests:  Recent Labs Lab 08/11/16 0006 08/14/16 0625  ALBUMIN 3.4* 3.3*   No results for input(s): LIPASE, AMYLASE in the last 168 hours. No results for input(s): AMMONIA in the last 168 hours. CBC:  Recent Labs Lab 08/13/16 0508 08/14/16 0625  WBC 8.0 7.3  HGB 9.5* 10.2*  HCT 29.5* 31.7*  MCV 78.2 78.5  PLT 173 194   Cardiac Enzymes:  Recent Labs Lab 08/11/16 0608 08/11/16 1202  TROPONINI 0.04* 0.03*   BNP: Invalid input(s): POCBNP CBG:  Recent Labs Lab 08/13/16 2218 08/14/16 1057  GLUCAP 240* 145*    Significant Diagnostic Studies:  Dg Chest 2 View  Result Date: 08/10/2016 CLINICAL DATA:  Worsening shortness of breath and chest tightness for the past week. EXAM: CHEST  2 VIEW  COMPARISON:  05/07/2016. FINDINGS: Stable elevated left hemidiaphragm. Mild left basilar atelectasis. Minimal right basilar atelectasis. Stable enlarged cardiac silhouette and tortuous and partially calcified thoracic aorta. Mild prominence of the interstitial markings without significant change. No pleural fluid seen. Diffuse osteopenia. Bilateral shoulder degenerative changes and superior migration of the right humeral head. IMPRESSION: 1. Mild left basilar atelectasis and minimal right basilar atelectasis.  2. Stable cardiomegaly and mild chronic interstitial lung disease. 3. Bilateral shoulder degenerative changes and evidence of a large, chronic right rotator cuff tear. Electronically Signed   By: Claudie Revering M.D.   On: 08/10/2016 17:37   Ct Chest Wo Contrast  Result Date: 08/10/2016 CLINICAL DATA:  83 year old female with shortness of breath and wheezing. EXAM: CT CHEST WITHOUT CONTRAST TECHNIQUE: Multidetector CT imaging of the chest was performed following the standard protocol without IV contrast. COMPARISON:  Chest radiograph dated 08/10/2016 FINDINGS: Evaluation of this exam is limited in the absence of intravenous contrast. Cardiovascular: There is moderate cardiomegaly. No pericardial effusion. Multi vessel coronary vascular calcification with involvement of the LAD, RCA, and left circumflex artery. There is advanced atherosclerotic calcification of the thoracic aorta. No aneurysmal dilatation. Mild prominence of the central pulmonary vasculature, likely suggestive of underlying pulmonary hypertension. Mediastinum/Nodes: There is no hilar or mediastinal adenopathy. The esophagus is grossly unremarkable. Heterogeneous thyroid gland. Lungs/Pleura: There is eventration of the left hemidiaphragm. There is consolidative changes of the basal segment of the left lower lobe, likely atelectasis. Infiltrate is not entirely excluded. Clinical correlation is recommended. Right lung base subpleural thickening/  scarring. There is no pleural effusion or pneumothorax. The central airways are patent. Upper Abdomen: There is pneumobilia with air within the gallbladder. Multiple noncalcified stone noted in the gallbladder neck. There is a 4.7 x 3.0 cm lobulated low attenuating lesion in the left upper abdomen which is not well characterized. This lesion appears to arise from the tail of the pancreas and demonstrates a fluid density and may represent a pancreatic pseudocyst versus a cystic neoplasm. Further characterization with nonemergent MRI is recommended. There is bilateral renal atrophy. Musculoskeletal: There is a 4.3 x 5.7 cm solid-appearing mass in the right breast. Correlation with clinical exam and mammographic studies, as clinically indicated, recommended. Smaller partially calcified nodular densities noted adjacent to the larger mass. Multilevel degenerative changes of the spine. No acute osseous pathology. IMPRESSION: 1. Eventration of the left hemidiaphragm with left lung base consolidative changes, likely atelectasis. Pneumonia is not excluded. Clinical correlation is recommended. 2. Cardiomegaly with multi vessel coronary vascular calcification. 3.  Aortic Atherosclerosis (ICD10-I70.0). 4. Gallstones. There is pneumobilia, likely related to patulous ampulla of water. Clinical correlation is recommended. 5. Ovoid cystic appearing lesion in the tail of the pancreas may represent a pseudocyst or a cystic neoplasm. Further characterization with nonemergent MRI recommended. 6. Right breast mass. Correlation with clinical exam and mammographic studies, as clinically indicated, recommended. Electronically Signed   By: Anner Crete M.D.   On: 08/10/2016 22:21   Mr Abdomen Wo Contrast  Result Date: 08/11/2016 CLINICAL DATA:  Cystic lesion in the pancreatic tail on CT. End-stage renal disease on hemodialysis. Right breast mass and cholelithiasis. EXAM: MRI ABDOMEN WITHOUT CONTRAST TECHNIQUE: Multiplanar  multisequence MR imaging was performed without the administration of intravenous contrast. COMPARISON:  Chest CT 08/10/2016.  Abdominal MRI 11/03/2009. FINDINGS: Despite efforts by the technologist and patient, moderate to marked motion artifact is present on today's exam and could not be eliminated. This reduces exam sensitivity and specificity. Thin section MRCP images are very limited. Lower chest: Elevation of the left hemidiaphragm and patchy bibasilar airspace opacities are similar to recent CT. The heart is enlarged. Hepatobiliary: The liver demonstrates markedly decreased T2 signal, consistent with hemosiderosis/ iron overload. There is corresponding loss of signal on the gradient echo in phase images. No focal hepatic lesions are identified. Multiple gallstones are present associated with mild nonspecific gallbladder  wall thickening. There is no significant biliary dilatation. Pneumobilia better seen on CT. Pancreas: Diffusely atrophied. Enlarging multilobular cystic mass in the pancreatic tail measures 5.1 x 3.4 x 3.1 cm. This previously measured 10 mm maximally. No solid components are identified on noncontrast imaging. There is no pancreatic ductal dilatation. Additional tiny cystic lesions more proximally in the pancreatic tail and body are unchanged. Spleen: Stable in size with a stable T2 hyperintense lesion medially measuring 2.5 cm. Diffusely decreased splenic T2 signal consistent with hemosiderosis/iron overload. Adrenals/Urinary Tract: Both adrenal glands appear normal. Both kidneys demonstrate marked cortical thinning consistent with chronic renal failure. There are small renal cysts bilaterally. No hydronephrosis. Stomach/Bowel: No evidence of bowel wall thickening, distention or surrounding inflammatory change. Vascular/Lymphatic: There are no enlarged abdominal lymph nodes. No acute vascular findings. Other: Generalized soft tissue edema. No significant ascites. There is a large lobulated  right breast mass as seen on recent CT, measuring 5.5 x 4.2 x 3.9 cm. This is partially visible on previous MRI and has not markedly enlarged during this interval. Musculoskeletal: No acute or significant osseous findings. Mild spondylosis. Probable chronic changes of renal osteodystrophy involving the sacroiliac joints. IMPRESSION: 1. Lobulated cystic mass involving the pancreatic tail has enlarged from previous MRI of 2011, suspicious for cystic pancreatic neoplasm. No communication with the pancreatic duct or aggressive characteristics demonstrated by noncontrast imaging. 2. 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 Stable splenic lesion. 3. Cholelithiasis without evidence of biliary dilatation. Motion and pneumobilia limited evaluation of the biliary system. 4. Grossly stable large right breast mass from previous MRI from 2011, supporting a benign etiology. 5. Sequela of chronic renal failure with atrophy kidneys, generalized soft tissue edema and probable changes of renal osteodystrophy. Electronically Signed   By: Richardean Sale M.D.   On: 08/11/2016 17:14   Mr 3d Recon At Scanner  Result Date: 08/11/2016 CLINICAL DATA:  Cystic lesion in the pancreatic tail on CT. End-stage renal disease on hemodialysis. Right breast mass and cholelithiasis. EXAM: MRI ABDOMEN WITHOUT CONTRAST TECHNIQUE: Multiplanar multisequence MR imaging was performed without the administration of intravenous contrast. COMPARISON:  Chest CT 08/10/2016.  Abdominal MRI 11/03/2009. FINDINGS: Despite efforts by the technologist and patient, moderate to marked motion artifact is present on today's exam and could not be eliminated. This reduces exam sensitivity and specificity. Thin section MRCP images are very limited. Lower chest: Elevation of the left hemidiaphragm and patchy bibasilar airspace opacities are similar to recent CT. The heart is enlarged. Hepatobiliary: The liver demonstrates markedly decreased T2 signal,  consistent with hemosiderosis/ iron overload. There is corresponding loss of signal on the gradient echo in phase images. No focal hepatic lesions are identified. Multiple gallstones are present associated with mild nonspecific gallbladder wall thickening. There is no significant biliary dilatation. Pneumobilia better seen on CT. Pancreas: Diffusely atrophied. Enlarging multilobular cystic mass in the pancreatic tail measures 5.1 x 3.4 x 3.1 cm. This previously measured 10 mm maximally. No solid components are identified on noncontrast imaging. There is no pancreatic ductal dilatation. Additional tiny cystic lesions more proximally in the pancreatic tail and body are unchanged. Spleen: Stable in size with a stable T2 hyperintense lesion medially measuring 2.5 cm. Diffusely decreased splenic T2 signal consistent with hemosiderosis/iron overload. Adrenals/Urinary Tract: Both adrenal glands appear normal. Both kidneys demonstrate marked cortical thinning consistent with chronic renal failure. There are small renal cysts bilaterally. No hydronephrosis. Stomach/Bowel: No evidence of bowel wall thickening, distention or surrounding inflammatory change. Vascular/Lymphatic: There are  no enlarged abdominal lymph nodes. No acute vascular findings. Other: Generalized soft tissue edema. No significant ascites. There is a large lobulated right breast mass as seen on recent CT, measuring 5.5 x 4.2 x 3.9 cm. This is partially visible on previous MRI and has not markedly enlarged during this interval. Musculoskeletal: No acute or significant osseous findings. Mild spondylosis. Probable chronic changes of renal osteodystrophy involving the sacroiliac joints. IMPRESSION: 1. Lobulated cystic mass involving the pancreatic tail has enlarged from previous MRI of 2011, suspicious for cystic pancreatic neoplasm. No communication with the pancreatic duct or aggressive characteristics demonstrated by noncontrast imaging. 2. Changes of  hemosiderosis/iron overload in the liver and spleen. Stable splenic lesion. 3. Cholelithiasis without evidence of biliary dilatation. Motion and pneumobilia limited evaluation of the biliary system. 4. Grossly stable large right breast mass from previous MRI from 2011, supporting a benign etiology. 5. Sequela of chronic renal failure with atrophy kidneys, generalized soft tissue edema and probable changes of renal osteodystrophy. Electronically Signed   By: Richardean Sale M.D.   On: 08/11/2016 17:14    2D ECHO: Impressions:  - Normal LV size with mild LV hypertrophy. EF 60-65%. Normal RV   size and systolic function. Mild aortic stenosis. There was   moderate pulmonary hypertension with a dilated IVC suggesting   elevated RV filling pressure.  Disposition and Follow-up: Discharge Instructions    Diet Carb Modified    Complete by:  As directed    Increase activity slowly    Complete by:  As directed        DISPOSITION:  [place], Modena Nunnery, MD. Schedule an appointment as soon as possible for a visit in 2 week(s).   Specialty:  Family Medicine Contact information: [address] [place] [address] [phone]            Time spent on Discharge: 35 minutes  Signed:   Estill Cotta M.D. [facility] 08/14/2016, 1:38 PM Pager: [phone]   Coding query addendum  Acute on chronic diastolic CHF   2d echo showed EF of 60-65% with grade 1 diastolic dysfunction.  Volume overload was managed with hemodialysis     Ripudeep Rai M.D. [facility] 08/17/2016, 7:52 PM  Pager: [phone]

## 2016-08-14 NOTE — Care Management Note (Signed)
Case Management Note  Patient Details  Name: [name] MRN: [id number] Date of Birth: 1925-05-28  Subjective/Objective:                 Patient with order to DC to home today. Chart reviewed. No Home Health or Equipment needs, no unacknowledged Case Management consults or medication needs identified at the time of this note. Plan for DC to home. If needs arise today prior to discharge, please call Carles Collet RN CM at [phone].    Action/Plan:   Expected Discharge Date:  08/14/16               Expected Discharge Plan:  Home/Self Care  In-House Referral:     Discharge planning Services  CM Consult  Post Acute Care Choice:    Choice offered to:     DME Arranged:    DME Agency:     HH Arranged:    HH Agency:     Status of Service:  Completed, signed off  If discussed at [employer] of Stay Meetings, dates discussed:    Additional Comments:  Carles Collet, RN 08/14/2016, 11:36 AM

## 2016-08-14 NOTE — Clinical Social Work Note (Signed)
Clinical Social Worker facilitated patient discharge including contacting patient family and facility to confirm patient discharge plans.  Clinical information sent to facility and family agreeable with plan.  CSW arranged ambulance transport via [facility] to [facility].  RN to call [phone] for report prior to discharge.  Clinical Social Worker will sign off for now as social work intervention is no longer needed. Please consult [place] again if new need arises.  [place], [place]

## 2016-08-14 NOTE — Progress Notes (Signed)
[name] to be D/C'd Skilled nursing facility per MD order.  Discussed prescriptions and follow up appointments with the patient. Prescriptions given to patient, medication list explained in detail. Pt verbalized understanding.  Allergies as of 08/14/2016   No Known Allergies     Medication List    TAKE these medications   albuterol (2.5 MG/3ML) 0.083% nebulizer solution Commonly known as:  PROVENTIL USE 1 VIAL IN NEBULIZER 3 TIMES DAILY AS NEEDED FOR WHEEZING/SHORTNESS OF BREATH.   VENTOLIN HFA 108 (90 Base) MCG/ACT inhaler Generic drug:  albuterol INHALE (2) PUFFS EVERY 4 HOURS AS NEEDED FOR WHEEZING OR SHORTNESS OFBREATH(COUGH).   aspirin 81 MG chewable tablet Chew by mouth daily.   bisacodyl 10 MG suppository Commonly known as:  DULCOLAX 1 suppository rectally  Daily every 3 days as needed for constipation What changed:  how much to take  how to take this  when to take this  reasons to take this  additional instructions   citalopram 20 MG tablet Commonly known as:  CELEXA TAKE (1) TABLET BY MOUTH ONCE IN THE MORNING.   diltiazem 180 MG 24 hr capsule Commonly known as:  CARDIZEM CD TAKE 1 CAPSULE BY MOUTH ONCE A DAY.   docusate sodium 100 MG capsule Commonly known as:  COLACE Take 100 mg by mouth 2 (two) times daily. What changed:  Another medication with the same name was removed. Continue taking this medication, and follow the directions you see here.   doxycycline 100 MG tablet Commonly known as:  VIBRA-TABS Take 1 tablet (100 mg total) by mouth 2 (two) times daily. X 6 days   guaiFENesin-dextromethorphan 100-10 MG/5ML syrup Commonly known as:  ROBITUSSIN DM TAKE ONE TEASPOONFUL BY MOUTH EVERY 4 HOURS AS NEEDED FOR COUGH   ipratropium 0.02 % nebulizer solution Commonly known as:  ATROVENT USE 1 VIAL IN NEBULIZER 3 TIMES DAILY AS NEEDED FOR WHEEZING/SHORTNESS OF BREATH.   isosorbide mononitrate 60 MG 24 hr tablet Commonly known as:  IMDUR TAKE  (1) TABLET BY MOUTH ONCE DAILY.   Lancets Misc Use as directed to monitor FSBS 1x daily x2 weeks, then 1x daily PRN S/Sx hypoglycemia. Dx: E11.9.   levothyroxine 25 MCG tablet Commonly known as:  SYNTHROID, LEVOTHROID TAKE (1) TABLET BY MOUTH ONCE DAILY BEFORE BREAKFAST.   lidocaine-prilocaine cream Commonly known as:  EMLA APPLY TOPICALLY TO LEFT UPPER ARM 1 HOUR PRIOR TO DIALYSIS ON MONDAY,WEDNESDAY & FRIDAYS AS NEEDED FOR PAIN. WRAP WITH PLASTIC WRAP AFTER A   mirtazapine 15 MG tablet Commonly known as:  REMERON TAKE 1/2 TABLET (7.5MG ) BY MOUTH AT BEDTIME FOR MOOD/APPETITE.   nitroGLYCERIN 0.4 MG SL tablet Commonly known as:  NITROSTAT DISSOLVE 1 TABLET UNDER THE TONGUE EVERY 5 MINUTES X2 DOSES THEN TO ER   OXYGEN Inhale 2 L into the lungs daily as needed (shortness of breath).   pantoprazole 40 MG tablet Commonly known as:  PROTONIX TAKE (1) TABLET BY MOUTH ONCE DAILY.   polyethylene glycol powder powder Commonly known as:  MIRALAX Take 17 g by mouth as needed for mild constipation. Hold for diarrhea What changed:  when to take this  reasons to take this   polyvinyl alcohol 1.4 % ophthalmic solution Commonly known as:  LIQUIFILM TEARS Place 1 drop into both eyes as needed for dry eyes.   predniSONE 10 MG tablet Commonly known as:  DELTASONE Prednisone dosing: Take  Prednisone 30mg  (3 tabs) x 3 days, then 20mg  (2 tabs) x 3days, then 10mg  (1  tab) x 3days, then OFF.   SENSIPAR 30 MG tablet Generic drug:  cinacalcet TAKE 1 TABLET BY MOUTH ONCE DAILY AFTER THE EVENING MEAL   sevelamer carbonate 800 MG tablet Commonly known as:  RENVELA Take 800-2,400 mg by mouth 5 (five) times daily. Take 3 tablets  three times daily with meals and take 1 tablet twice daily with snacks   TAB-A-VITE Tabs Take 1 tablet by mouth daily. What changed:  Another medication with the same name was removed. Continue taking this medication, and follow the directions you see here.    traMADol 50 MG tablet Commonly known as:  ULTRAM Take 1 tablet (50 mg total) by mouth every 12 (twelve) hours as needed.   Travoprost (BAK Free) 0.004 % Soln ophthalmic solution Commonly known as:  TRAVATAN Place 1 drop into both eyes at bedtime.       Vitals:   08/14/16 1000 08/14/16 1013  BP: (!) 147/72 128/64  Pulse: 65 61  Resp:  18  Temp:  97.7 F (36.5 C)    Skin clean, dry and intact without evidence of skin break down, no evidence of skin tears noted. IV catheter discontinued intact. Site without signs and symptoms of complications. Dressing and pressure applied. Pt denies pain at this time. No complaints noted.  An After Visit Summary was printed and given to the patient. Patient escorted via stretcher, and D/C to [facility] via transport.  Dixie Dials RN, BSN

## 2016-08-14 NOTE — Procedures (Signed)
Patient was seen on dialysis and the procedure was supervised.  BFR 350  Via AVF BP is  156/96.   Patient appears to be tolerating treatment well  Seleta Hovland A 08/14/2016

## 2016-08-14 NOTE — NC FL2 (Signed)
[place] MEDICAID FL2 LEVEL OF CARE SCREENING TOOL     IDENTIFICATION  Patient Name: [name] Birthdate: 1925-01-23 Sex: female Admission Date (Current Location): 08/10/2016  [place] and [place] Number:  Kathleen Argue [id number] [place] and Address:  [place]. [facility], [place] [address], [place], [place] [address]      Provider Number: [id number]  Attending Physician Name and Address:  Mendel Corning, MD  Relative Name and Phone Number:  [name] - daughter.  [phone]    Current Level of Care: Hospital Recommended Level of Care: [place] ([place]) Prior Approval Number:    Date Approved/Denied:   PASRR Number:    Discharge Plan: Other (Comment) (ALF - [facility] [place])    Current Diagnoses: Patient Active Problem List   Diagnosis Date Noted  . COPD with acute exacerbation ([place]) 08/11/2016  . Essential hypertension 08/11/2016  . Chronic pain 08/11/2016  . Acute on chronic respiratory failure ([place]) 08/10/2016  . Esophageal dysmotility 06/01/2016  . Dysphagia   . Chest pain 05/07/2016  . Chronic diastolic heart failure ([place]) 10/07/2015  . Fluid overload 10/02/2015  . Hyperglycemia 10/02/2015  . D-dimer, elevated   . Depression   . Pseudoaneurysm of arteriovenous dialysis fistula ([place]) 05/29/2015  . Traumatic hematoma of scalp 02/12/2014  . Decreased hearing of both ears 02/12/2014  . At high risk for falls 02/12/2014  . Leg swelling 09/18/2013  . OA (osteoarthritis) of knee 09/18/2013  . MDD (major depressive disorder) ([place]) 04/05/2013  . Mechanical complication of other vascular device, implant, and graft 11/14/2012  . Pain in limb-Left arm 07/11/2012  . Hypothyroidism 06/08/2012  . GERD (gastroesophageal reflux disease) 03/23/2012  . Gait instability 01/13/2012  . Neuropathy of hand 01/13/2012  . Breast mass, right 07/11/2011  . Hemorrhoid 03/14/2011  . ESRD (end stage renal disease) on dialysis ([place])  02/12/2011  . Bradycardia 02/01/2011  . Acute on chronic diastolic CHF (congestive heart failure) ([place]) 01/31/2011  . Dyspnea 01/17/2011  . Chronic respiratory failure ([place]) 01/13/2011  . Constipation 12/09/2010  . Insomnia 10/14/2010  . Diabetes mellitus with renal complications ([place]) 28/78/6767  . ALLERGIC RHINITIS 04/03/2008  . Benign hypertensive heart and kidney disease and CKD stage V ([place]) 08/07/2007  . DEPENDENT EDEMA, LEGS, BILATERAL 11/17/2006  . HYPERLIPIDEMIA 10/10/2006  . Anemia in chronic kidney disease 10/10/2006  . ANXIETY 09/05/2006  . CATARACT NOS 09/05/2006  . IBS 09/05/2006  . ARTHRITIS 09/05/2006    Orientation RESPIRATION BLADDER Height & Weight     Self, Time, Situation, Place  O2 (2 Liters) Continent Weight: 165 lb 5.5 oz (75 kg) (stood to scale ) Height:  5\' 6"  (167.6 cm)  BEHAVIORAL SYMPTOMS/MOOD NEUROLOGICAL BOWEL NUTRITION STATUS      Continent Diet (Renal/carb modified with fluid restriction)  AMBULATORY STATUS COMMUNICATION OF NEEDS Skin   Limited Assist Verbally Other (Comment) (Pin hole wound to mid back; pressure ulcer-stage 2 to coccyx)                       Personal Care Assistance Level of Assistance  Bathing, Feeding, Dressing Bathing Assistance: Limited assistance Feeding assistance: Independent Dressing Assistance: Limited assistance     Functional Limitations Info  Sight, Hearing, Speech Sight Info: Impaired (Wears glasses) Hearing Info: Adequate Speech Info: Adequate    SPECIAL CARE FACTORS FREQUENCY  Contractures Contractures Info: Not present    Additional Factors Info  Code Status, Allergies, Insulin Sliding Scale Code Status Info: Full Allergies Info: No known allergies   Insulin Sliding Scale Info: 0-5 Units daily at bedtime; 0-9 Units 3X per day with meals       Current Medications (08/14/2016):  This is the current hospital active medication list Current Facility-Administered  Medications  Medication Dose Route Frequency Provider Last Rate Last Dose  . acetaminophen (TYLENOL) tablet 650 mg  650 mg Oral Q6H PRN Waldemar Dickens, MD       Or  . acetaminophen (TYLENOL) suppository 650 mg  650 mg Rectal Q6H PRN Waldemar Dickens, MD      . aspirin chewable tablet 81 mg  81 mg Oral Daily Waldemar Dickens, MD   81 mg at 08/14/16 1136  . Chlorhexidine Gluconate Cloth 2 % PADS 6 each  6 each Topical Q0600 Rai, Vernelle Emerald, MD   6 each at 08/14/16 0607  . cinacalcet (SENSIPAR) tablet 30 mg  30 mg Oral Q supper Waldemar Dickens, MD   30 mg at 08/13/16 1741  . citalopram (CELEXA) tablet 20 mg  20 mg Oral Daily Waldemar Dickens, MD   20 mg at 08/14/16 1134  . Darbepoetin Alfa (ARANESP) injection 150 mcg  150 mcg Intravenous Q Wed-HD Rai, Ripudeep K, MD   150 mcg at 08/12/16 0034  . diltiazem (CARDIZEM CD) 24 hr capsule 180 mg  180 mg Oral Daily Waldemar Dickens, MD   180 mg at 08/14/16 1135  . doxercalciferol (HECTOROL) injection 4.5 mcg  4.5 mcg Intravenous Q M,W,F-HD Corliss Parish, MD   4.5 mcg at 08/13/16 0929  . doxycycline (VIBRA-TABS) tablet 100 mg  100 mg Oral Q12H Bajbus, Lauren D, RPH   100 mg at 08/14/16 1135  . heparin injection 5,000 Units  5,000 Units Subcutaneous Q8H Waldemar Dickens, MD   5,000 Units at 08/14/16 [phone]  . hydrALAZINE (APRESOLINE) injection 5-10 mg  5-10 mg Intravenous Q4H PRN Waldemar Dickens, MD      . insulin aspart (novoLOG) injection 0-5 Units  0-5 Units Subcutaneous QHS Rai, Ripudeep K, MD   2 Units at 08/13/16 2301  . insulin aspart (novoLOG) injection 0-9 Units  0-9 Units Subcutaneous TID WC Rai, Ripudeep K, MD   1 Units at 08/14/16 1200  . ipratropium-albuterol (DUONEB) 0.5-2.5 (3) MG/3ML nebulizer solution 3 mL  3 mL Nebulization Q6H PRN Rai, Ripudeep K, MD      . isosorbide mononitrate (IMDUR) 24 hr tablet 60 mg  60 mg Oral Daily Waldemar Dickens, MD   60 mg at 08/14/16 1134  . latanoprost (XALATAN) 0.005 % ophthalmic solution 1 drop  1  drop Both Eyes QHS Waldemar Dickens, MD   1 drop at 08/13/16 2302  . levothyroxine (SYNTHROID, LEVOTHROID) tablet 25 mcg  25 mcg Oral QAC breakfast Waldemar Dickens, MD   25 mcg at 08/14/16 1135  . mirtazapine (REMERON) tablet 15 mg  15 mg Oral QHS Waldemar Dickens, MD   15 mg at 08/13/16 2301  . multivitamin with minerals tablet 1 tablet  1 tablet Oral Daily Waldemar Dickens, MD   1 tablet at 08/14/16 1134  . mupirocin ointment (BACTROBAN) 2 % 1 application  1 application Nasal BID Rai, Ripudeep K, MD   1 application at 62/83/15 1131  . nitroGLYCERIN (NITROSTAT) SL tablet 0.3 mg  0.3 mg Sublingual Q5 min PRN Linna Darner  J, MD      . ondansetron (ZOFRAN) tablet 4 mg  4 mg Oral Q6H PRN Waldemar Dickens, MD       Or  . ondansetron [facility]) injection 4 mg  4 mg Intravenous Q6H PRN Waldemar Dickens, MD      . pantoprazole (PROTONIX) EC tablet 40 mg  40 mg Oral Daily Waldemar Dickens, MD   40 mg at 08/14/16 1135  . polyethylene glycol (MIRALAX / GLYCOLAX) packet 17 g  17 g Oral PRN Waldemar Dickens, MD      . predniSONE (DELTASONE) tablet 40 mg  40 mg Oral Q breakfast Rai, Ripudeep K, MD   40 mg at 08/14/16 1135  . sevelamer carbonate (RENVELA) tablet 2,400 mg  2,400 mg Oral TID WC Waldemar Dickens, MD   2,400 mg at 08/14/16 1201  . sevelamer carbonate (RENVELA) tablet 800 mg  800 mg Oral PRN Waldemar Dickens, MD      . sodium chloride flush (NS) 0.9 % injection 3 mL  3 mL Intravenous BID Rai, Ripudeep K, MD   3 mL at 08/14/16 1132  . traMADol (ULTRAM) tablet 50 mg  50 mg Oral Q12H PRN Waldemar Dickens, MD         Discharge Medications: Please see discharge summary for a list of discharge medications.  Relevant Imaging Results:  Relevant Lab Results:   Additional Information Dialysis [facility] MWF.  Discharge medications  Kaidon Kinker A Jearlene Bridwell, LCSW

## 2016-08-14 NOTE — Progress Notes (Signed)
Subjective:  Seen on HD this AM- in good spirits - post weight yest was 79.4 and EDW as OP was 84.5.  Going for another 3  liters today    Objective Vital signs in last 24 hours: Vitals:   08/14/16 0656 08/14/16 0710 08/14/16 0730 08/14/16 0800  BP: (!) 156/96 (!) 176/87 (!) 176/81 (!) (P) 177/75  Pulse: 69 63 (!) 55 (!) (P) 59  Resp: 18     Temp: 98 F (36.7 C)     TempSrc: Oral     SpO2: 100%     Weight:      Height:       Weight change: 1.771 kg (3 lb 14.5 oz)  Intake/Output Summary (Last 24 hours) at 08/14/16 0901 Last data filed at 08/14/16 0615  Gross per 24 hour  Intake              423 ml  Output             4000 ml  Net            -3577 ml    Dialyzes at [place] 84.5. 350 blood flow rate 3 and half hours 15-gauge needles HD Bath 2 potassium/2.5 calcium, Heparin yes. Access AV fistula on the left is on Epogen 6200 every HD, Hectorol 4.5 g every HD, Sensipar 30 daily and Renvela   Assessment/Plan: 83 year old black female with ESRD. Presents with shortness of breath 1 shortness of breath- feel it could be multifactorial. She normally wears oxygen at baseline. Tells me that she thinks she has lost weight but her dry weight had not been decreased. Therefore, could have element of CHF. challenged fluid removal with dialysis went well with clinical improvement. She also appears to be more anemic than baseline and this could be contributing. 2 ESRD: Normally Monday Wednesday Friday at [facility]. Plan for dialysis today off schedule- extra treatment,  with new EDW for OP unit on Monday.  Is OK for discharge today from my standpoint 3 Hypertension: Blood pressure is up. This is a good scenario for being able to pull fluid. She agrees to a temporary increase in dialysis time from 3 and half to 4 hours in an effort to remove more fluid.  On no blood pressure medications as an outpatient, another clue that she is overloaded and that is what is driving high BP- on HD is  120's over 70's 4. Anemia of ESRD: Is on an ESA as outpatient. It seems however that her hemoglobin has dropped fairly acutely. We will increase ESA and monitor- hgb 9.5- up to 10.2 5. Metabolic Bone Disease: continued home doses of Sensipar, Hectorol and also continue Renvela from phos binding- labs WNL   Gibril Mastro A    Labs: Basic Metabolic Panel:  Recent Labs Lab 08/11/16 0006 08/11/16 0608 08/12/16 0616 08/13/16 0508 08/14/16 0625  NA 129* 134* 135 133* 134*  K 5.4* 5.3* 3.9 5.4* 4.5  CL 93* 95* 95* 95* 95*  CO2 27 30 32 29 29  GLUCOSE 203* 146* 112* 121* 159*  BUN 54* 42* 14 40* 35*  CREATININE 8.61* 7.66* 3.83* 5.98* 5.00*  CALCIUM 8.5* 8.5* 8.7* 8.6* 8.8*  PHOS 2.9 3.7  --   --  2.6   Liver Function Tests:  Recent Labs Lab 08/11/16 0006 08/14/16 0625  ALBUMIN 3.4* 3.3*   No results for input(s): LIPASE, AMYLASE in the last 168 hours. No results for input(s): AMMONIA in the last 168 hours. CBC:  Recent Labs Lab 08/10/16 1944 08/11/16 0004 08/11/16 0608 08/13/16 0508 08/14/16 0625  WBC 6.0 4.4 5.0 8.0 7.3  HGB 8.9* 8.7* 9.4* 9.5* 10.2*  HCT 27.8* 26.9* 29.9* 29.5* 31.7*  MCV 78.5 77.1* 78.7 78.2 78.5  PLT 166 151 172 173 194   Cardiac Enzymes:  Recent Labs Lab 08/11/16 0153 08/11/16 0608 08/11/16 1202  TROPONINI 0.05* 0.04* 0.03*   CBG:  Recent Labs Lab 08/12/16 1654 08/12/16 2057 08/13/16 1344 08/13/16 1732 08/13/16 2218  GLUCAP 213* 160* 195* 150* 240*    Iron Studies: No results for input(s): IRON, TIBC, TRANSFERRIN, FERRITIN in the last 72 hours. Studies/Results: No results found. Medications: Infusions:   Scheduled Medications: . aspirin  81 mg Oral Daily  . Chlorhexidine Gluconate Cloth  6 each Topical Q0600  . cinacalcet  30 mg Oral Q supper  . citalopram  20 mg Oral Daily  . darbepoetin (ARANESP) injection - DIALYSIS  150 mcg Intravenous Q Wed-HD  . diltiazem  180 mg Oral Daily  . doxercalciferol  4.5 mcg  Intravenous Q M,W,F-HD  . doxycycline  100 mg Oral Q12H  . heparin  5,000 Units Subcutaneous Q8H  . insulin aspart  0-5 Units Subcutaneous QHS  . insulin aspart  0-9 Units Subcutaneous TID WC  . isosorbide mononitrate  60 mg Oral Daily  . latanoprost  1 drop Both Eyes QHS  . levothyroxine  25 mcg Oral QAC breakfast  . mirtazapine  15 mg Oral QHS  . multivitamin with minerals  1 tablet Oral Daily  . mupirocin ointment  1 application Nasal BID  . pantoprazole  40 mg Oral Daily  . predniSONE  40 mg Oral Q breakfast  . sevelamer carbonate  2,400 mg Oral TID WC  . sodium chloride flush  3 mL Intravenous BID    have reviewed scheduled and prn medications.  Physical Exam: General: pleasant, confused at times Heart: RRR Lungs: cbs bilat Abdomen: obese, soft, non tender Extremities: pitting edema Dialysis Access: left upper arm AVF    08/14/2016,9:01 AM  LOS: 4 days       '

## 2016-08-14 NOTE — Progress Notes (Signed)
Report called and given to Danae Chen, Therapist, sports at [facility].

## 2016-08-16 DIAGNOSIS — N186 End stage renal disease: Secondary | ICD-10-CM | POA: Diagnosis not present

## 2016-08-16 DIAGNOSIS — Z992 Dependence on renal dialysis: Secondary | ICD-10-CM | POA: Diagnosis not present

## 2016-08-18 ENCOUNTER — Other Ambulatory Visit: Payer: Self-pay | Admitting: Family Medicine

## 2016-08-18 ENCOUNTER — Telehealth: Payer: Self-pay | Admitting: Family Medicine

## 2016-08-18 DIAGNOSIS — Z992 Dependence on renal dialysis: Secondary | ICD-10-CM | POA: Diagnosis not present

## 2016-08-18 DIAGNOSIS — N186 End stage renal disease: Secondary | ICD-10-CM | POA: Diagnosis not present

## 2016-08-18 NOTE — Telephone Encounter (Signed)
Okay to order, they need to monitor the BP, send me readings in 2 weeks

## 2016-08-18 NOTE — Telephone Encounter (Signed)
Call placed to [place], [place] with [facility].   States that patient had fall on 08/09/2016 and 08/18/2016. States that she is not sure if BP dropped, or if patient has increased weakness d/t recent hospitalization.   Requested order for PT to eval and tx. Also faxed request to office.   MD please advise.

## 2016-08-18 NOTE — Telephone Encounter (Signed)
Order faxed.

## 2016-08-18 NOTE — Telephone Encounter (Signed)
New Message  Horris Latino voiced pt had two falls within the last two weeks, may have hypo orthastatic and wants to know if therapy can come in. Needs face to face and follow up. Needing verbal order.  Fax#: [phone]

## 2016-08-20 DIAGNOSIS — N186 End stage renal disease: Secondary | ICD-10-CM | POA: Diagnosis not present

## 2016-08-20 DIAGNOSIS — Z992 Dependence on renal dialysis: Secondary | ICD-10-CM | POA: Diagnosis not present

## 2016-08-23 DIAGNOSIS — N186 End stage renal disease: Secondary | ICD-10-CM | POA: Diagnosis not present

## 2016-08-23 DIAGNOSIS — Z992 Dependence on renal dialysis: Secondary | ICD-10-CM | POA: Diagnosis not present

## 2016-08-24 ENCOUNTER — Telehealth: Payer: Self-pay | Admitting: *Deleted

## 2016-08-24 DIAGNOSIS — G8929 Other chronic pain: Secondary | ICD-10-CM | POA: Diagnosis not present

## 2016-08-24 DIAGNOSIS — I132 Hypertensive heart and chronic kidney disease with heart failure and with stage 5 chronic kidney disease, or end stage renal disease: Secondary | ICD-10-CM | POA: Diagnosis not present

## 2016-08-24 DIAGNOSIS — Z9181 History of falling: Secondary | ICD-10-CM | POA: Diagnosis not present

## 2016-08-24 DIAGNOSIS — J441 Chronic obstructive pulmonary disease with (acute) exacerbation: Secondary | ICD-10-CM | POA: Diagnosis not present

## 2016-08-24 DIAGNOSIS — E1122 Type 2 diabetes mellitus with diabetic chronic kidney disease: Secondary | ICD-10-CM | POA: Diagnosis not present

## 2016-08-24 DIAGNOSIS — F329 Major depressive disorder, single episode, unspecified: Secondary | ICD-10-CM | POA: Diagnosis not present

## 2016-08-24 DIAGNOSIS — M170 Bilateral primary osteoarthritis of knee: Secondary | ICD-10-CM | POA: Diagnosis not present

## 2016-08-24 DIAGNOSIS — I5032 Chronic diastolic (congestive) heart failure: Secondary | ICD-10-CM | POA: Diagnosis not present

## 2016-08-24 DIAGNOSIS — N186 End stage renal disease: Secondary | ICD-10-CM | POA: Diagnosis not present

## 2016-08-24 NOTE — Telephone Encounter (Signed)
noted 

## 2016-08-24 NOTE — Telephone Encounter (Signed)
Received call from Amy, PT with [facility]. [phone]~ telephone.   Requested VO to extend PT services 2x weekly x3 weeks, then 1 weekly x1 week for balance, gait, transfers and activity tolerance. VO given.   MD to be made aware.

## 2016-08-25 DIAGNOSIS — N186 End stage renal disease: Secondary | ICD-10-CM | POA: Diagnosis not present

## 2016-08-25 DIAGNOSIS — Z992 Dependence on renal dialysis: Secondary | ICD-10-CM | POA: Diagnosis not present

## 2016-08-26 DIAGNOSIS — G8929 Other chronic pain: Secondary | ICD-10-CM | POA: Diagnosis not present

## 2016-08-26 DIAGNOSIS — M170 Bilateral primary osteoarthritis of knee: Secondary | ICD-10-CM | POA: Diagnosis not present

## 2016-08-26 DIAGNOSIS — I5032 Chronic diastolic (congestive) heart failure: Secondary | ICD-10-CM | POA: Diagnosis not present

## 2016-08-26 DIAGNOSIS — F329 Major depressive disorder, single episode, unspecified: Secondary | ICD-10-CM | POA: Diagnosis not present

## 2016-08-26 DIAGNOSIS — N186 End stage renal disease: Secondary | ICD-10-CM | POA: Diagnosis not present

## 2016-08-26 DIAGNOSIS — I132 Hypertensive heart and chronic kidney disease with heart failure and with stage 5 chronic kidney disease, or end stage renal disease: Secondary | ICD-10-CM | POA: Diagnosis not present

## 2016-08-26 DIAGNOSIS — J441 Chronic obstructive pulmonary disease with (acute) exacerbation: Secondary | ICD-10-CM | POA: Diagnosis not present

## 2016-08-26 DIAGNOSIS — E1122 Type 2 diabetes mellitus with diabetic chronic kidney disease: Secondary | ICD-10-CM | POA: Diagnosis not present

## 2016-08-26 DIAGNOSIS — Z9181 History of falling: Secondary | ICD-10-CM | POA: Diagnosis not present

## 2016-08-27 DIAGNOSIS — Z992 Dependence on renal dialysis: Secondary | ICD-10-CM | POA: Diagnosis not present

## 2016-08-27 DIAGNOSIS — N186 End stage renal disease: Secondary | ICD-10-CM | POA: Diagnosis not present

## 2016-08-30 DIAGNOSIS — Z992 Dependence on renal dialysis: Secondary | ICD-10-CM | POA: Diagnosis not present

## 2016-08-30 DIAGNOSIS — N186 End stage renal disease: Secondary | ICD-10-CM | POA: Diagnosis not present

## 2016-08-31 ENCOUNTER — Telehealth: Payer: Self-pay | Admitting: Family Medicine

## 2016-08-31 ENCOUNTER — Ambulatory Visit (INDEPENDENT_AMBULATORY_CARE_PROVIDER_SITE_OTHER): Payer: Medicare HMO | Admitting: Family Medicine

## 2016-08-31 ENCOUNTER — Encounter: Payer: Self-pay | Admitting: Family Medicine

## 2016-08-31 VITALS — BP 142/80 | HR 82 | Temp 98.6°F | Resp 14 | Ht 66.0 in | Wt 176.0 lb

## 2016-08-31 DIAGNOSIS — N186 End stage renal disease: Secondary | ICD-10-CM | POA: Diagnosis not present

## 2016-08-31 DIAGNOSIS — R2681 Unsteadiness on feet: Secondary | ICD-10-CM

## 2016-08-31 DIAGNOSIS — J9611 Chronic respiratory failure with hypoxia: Secondary | ICD-10-CM | POA: Diagnosis not present

## 2016-08-31 DIAGNOSIS — I5032 Chronic diastolic (congestive) heart failure: Secondary | ICD-10-CM

## 2016-08-31 DIAGNOSIS — I132 Hypertensive heart and chronic kidney disease with heart failure and with stage 5 chronic kidney disease, or end stage renal disease: Secondary | ICD-10-CM | POA: Diagnosis not present

## 2016-08-31 DIAGNOSIS — M19041 Primary osteoarthritis, right hand: Secondary | ICD-10-CM

## 2016-08-31 DIAGNOSIS — J441 Chronic obstructive pulmonary disease with (acute) exacerbation: Secondary | ICD-10-CM | POA: Diagnosis not present

## 2016-08-31 DIAGNOSIS — Z9181 History of falling: Secondary | ICD-10-CM | POA: Diagnosis not present

## 2016-08-31 DIAGNOSIS — M19042 Primary osteoarthritis, left hand: Secondary | ICD-10-CM | POA: Diagnosis not present

## 2016-08-31 DIAGNOSIS — K862 Cyst of pancreas: Secondary | ICD-10-CM

## 2016-08-31 DIAGNOSIS — M170 Bilateral primary osteoarthritis of knee: Secondary | ICD-10-CM | POA: Diagnosis not present

## 2016-08-31 DIAGNOSIS — G8929 Other chronic pain: Secondary | ICD-10-CM | POA: Diagnosis not present

## 2016-08-31 DIAGNOSIS — F329 Major depressive disorder, single episode, unspecified: Secondary | ICD-10-CM | POA: Diagnosis not present

## 2016-08-31 DIAGNOSIS — E1122 Type 2 diabetes mellitus with diabetic chronic kidney disease: Secondary | ICD-10-CM | POA: Diagnosis not present

## 2016-08-31 NOTE — Telephone Encounter (Signed)
Note done

## 2016-08-31 NOTE — Patient Instructions (Addendum)
Continue pain medcation as prescribed F/U 4 months

## 2016-08-31 NOTE — Telephone Encounter (Signed)
MD please advise

## 2016-08-31 NOTE — Progress Notes (Addendum)
Subjective:    Patient ID: [name], female    DOB: July 02, 1925, 83 y.o.   MRN: [id number]  Patient presents for Hospital F/U (SOB) and B Hand Pain (reports tinglining and numbness to B 5th digits, also reports pain that radiates up L forearm from hand)   Pt here for hospital f/u , sent to ED from my office due to SOB last visit, treated with nebsAcute COPD exacerbation as well as acute on chronic respiratory failure. She was also dialyzed 4 times to remove extra fluid. She feels like her breathing is much improved now. She's completed her prednisone and her antibiotics.  It was also noted on her CT scan that she had a cystic lesion in the pancreas though this was noted back in 2011. She also has a stable benign right breast mass. Decision was made to discuss with me if she would need esophageal biopsy in order to rule out malignancy in the pancreas.  Past couple dialysis she has had some tingling and pain into those hands and her fifth digits and sometimes into her forearms.   She  also currently in physical therapy since hospitalization secondary to weak generalize weakness after her admission and gait instability.   Review Of Systems:  GEN- denies fatigue, fever, weight loss,weakness, recent illness HEENT- denies eye drainage, change in vision, nasal discharge, CVS- denies chest pain, palpitations RESP- denies SOB, cough, wheeze ABD- denies N/V, change in stools, abd pain GU- denies dysuria, hematuria, dribbling, incontinence MSK- denies joint pain, muscle aches, injury Neuro- denies headache, dizziness, syncope, seizure activity       Objective:    BP (!) 142/80   Pulse 82   Temp 98.6 F (37 C) (Oral)   Resp 14   Ht 5\' 6"  (1.676 m)   Wt 176 lb (79.8 kg)   LMP 12/24/2010   SpO2 99%   BMI 28.41 kg/m  GEN- NAD, alert and oriented x3 HEENT- PERRL, EOMI, non injected sclera, pink conjunctiva, MMM, oropharynx clear CVS- RRR,  3/6 SEM   RESP-CTAB ABD-NABS,soft,NT,ND MSK- arthritis changes hands, sensation grossly in tact bilat hands, able to grasp, make fist  EXT- No edema Pulses- Radial, DP- 2+        Assessment & Plan:    GAIT INSTABILITY   Recommend she continue with physical therapy as ordered. To help with gait and strengthening   Problem List Items Addressed This Visit      Unprioritized   Gait instability   Pancreatic cyst    After discussion with patient and her family she is high risk to undergo biopsy and she does not have any signs or symptoms of pancreatic cancer at this time. At her age and with her comorbidities we have decided not to pursue this.      Chronic respiratory failure (HCC) (Chronic)    She is currently back at her baseline. We will continue her regular medications she has not been all inhaler and nebs as needed.      Chronic diastolic heart failure ([place]) - Primary    She is currently compensated had extra episodes of dialysis. I think some of the pain in her hands may be due to some of the electrolyte changes with her multiple dialysis of the past couple weeks. She also has arthritis. She can just use tramadol as needed for any significant pain.       Other Visit Diagnoses    Arthritis of both hands  known OA ,alos neuropathy in hands noted in past monitor for now, ultram if severe      Note: This dictation was prepared with Dragon dictation along with smaller phrase technology. Any transcriptional errors that result from this process are unintentional.

## 2016-08-31 NOTE — Assessment & Plan Note (Signed)
After discussion with patient and her family she is high risk to undergo biopsy and she does not have any signs or symptoms of pancreatic cancer at this time. At her age and with her comorbidities we have decided not to pursue this.

## 2016-08-31 NOTE — Assessment & Plan Note (Signed)
She is currently compensated had extra episodes of dialysis. I think some of the pain in her hands may be due to some of the electrolyte changes with her multiple dialysis of the past couple weeks. She also has arthritis. She can just use tramadol as needed for any significant pain.

## 2016-08-31 NOTE — Telephone Encounter (Signed)
[name] calling in regards to physical therapy (face to face) needs this to be in pts chart. If you have questions feel free to call her [phone].

## 2016-08-31 NOTE — Assessment & Plan Note (Signed)
She is currently back at her baseline. We will continue her regular medications she has not been all inhaler and nebs as needed.

## 2016-09-01 DIAGNOSIS — N186 End stage renal disease: Secondary | ICD-10-CM | POA: Diagnosis not present

## 2016-09-01 DIAGNOSIS — Z992 Dependence on renal dialysis: Secondary | ICD-10-CM | POA: Diagnosis not present

## 2016-09-01 NOTE — Telephone Encounter (Signed)
Notes faxed.

## 2016-09-02 DIAGNOSIS — I5032 Chronic diastolic (congestive) heart failure: Secondary | ICD-10-CM | POA: Diagnosis not present

## 2016-09-02 DIAGNOSIS — E1122 Type 2 diabetes mellitus with diabetic chronic kidney disease: Secondary | ICD-10-CM | POA: Diagnosis not present

## 2016-09-02 DIAGNOSIS — F329 Major depressive disorder, single episode, unspecified: Secondary | ICD-10-CM | POA: Diagnosis not present

## 2016-09-02 DIAGNOSIS — N186 End stage renal disease: Secondary | ICD-10-CM | POA: Diagnosis not present

## 2016-09-02 DIAGNOSIS — G8929 Other chronic pain: Secondary | ICD-10-CM | POA: Diagnosis not present

## 2016-09-02 DIAGNOSIS — Z9181 History of falling: Secondary | ICD-10-CM | POA: Diagnosis not present

## 2016-09-02 DIAGNOSIS — I132 Hypertensive heart and chronic kidney disease with heart failure and with stage 5 chronic kidney disease, or end stage renal disease: Secondary | ICD-10-CM | POA: Diagnosis not present

## 2016-09-02 DIAGNOSIS — M170 Bilateral primary osteoarthritis of knee: Secondary | ICD-10-CM | POA: Diagnosis not present

## 2016-09-02 DIAGNOSIS — J441 Chronic obstructive pulmonary disease with (acute) exacerbation: Secondary | ICD-10-CM | POA: Diagnosis not present

## 2016-09-03 DIAGNOSIS — Z992 Dependence on renal dialysis: Secondary | ICD-10-CM | POA: Diagnosis not present

## 2016-09-03 DIAGNOSIS — N186 End stage renal disease: Secondary | ICD-10-CM | POA: Diagnosis not present

## 2016-09-06 DIAGNOSIS — Z992 Dependence on renal dialysis: Secondary | ICD-10-CM | POA: Diagnosis not present

## 2016-09-06 DIAGNOSIS — N186 End stage renal disease: Secondary | ICD-10-CM | POA: Diagnosis not present

## 2016-09-07 DIAGNOSIS — F329 Major depressive disorder, single episode, unspecified: Secondary | ICD-10-CM | POA: Diagnosis not present

## 2016-09-07 DIAGNOSIS — H35371 Puckering of macula, right eye: Secondary | ICD-10-CM | POA: Diagnosis not present

## 2016-09-07 DIAGNOSIS — H34812 Central retinal vein occlusion, left eye, with macular edema: Secondary | ICD-10-CM | POA: Diagnosis not present

## 2016-09-07 DIAGNOSIS — H43813 Vitreous degeneration, bilateral: Secondary | ICD-10-CM | POA: Diagnosis not present

## 2016-09-07 DIAGNOSIS — I5032 Chronic diastolic (congestive) heart failure: Secondary | ICD-10-CM | POA: Diagnosis not present

## 2016-09-07 DIAGNOSIS — G8929 Other chronic pain: Secondary | ICD-10-CM | POA: Diagnosis not present

## 2016-09-07 DIAGNOSIS — E113293 Type 2 diabetes mellitus with mild nonproliferative diabetic retinopathy without macular edema, bilateral: Secondary | ICD-10-CM | POA: Diagnosis not present

## 2016-09-07 DIAGNOSIS — M170 Bilateral primary osteoarthritis of knee: Secondary | ICD-10-CM | POA: Diagnosis not present

## 2016-09-07 DIAGNOSIS — Z9181 History of falling: Secondary | ICD-10-CM | POA: Diagnosis not present

## 2016-09-07 DIAGNOSIS — E1122 Type 2 diabetes mellitus with diabetic chronic kidney disease: Secondary | ICD-10-CM | POA: Diagnosis not present

## 2016-09-07 DIAGNOSIS — N186 End stage renal disease: Secondary | ICD-10-CM | POA: Diagnosis not present

## 2016-09-07 DIAGNOSIS — I132 Hypertensive heart and chronic kidney disease with heart failure and with stage 5 chronic kidney disease, or end stage renal disease: Secondary | ICD-10-CM | POA: Diagnosis not present

## 2016-09-07 DIAGNOSIS — J441 Chronic obstructive pulmonary disease with (acute) exacerbation: Secondary | ICD-10-CM | POA: Diagnosis not present

## 2016-09-08 DIAGNOSIS — N186 End stage renal disease: Secondary | ICD-10-CM | POA: Diagnosis not present

## 2016-09-08 DIAGNOSIS — Z992 Dependence on renal dialysis: Secondary | ICD-10-CM | POA: Diagnosis not present

## 2016-09-09 DIAGNOSIS — J441 Chronic obstructive pulmonary disease with (acute) exacerbation: Secondary | ICD-10-CM | POA: Diagnosis not present

## 2016-09-09 DIAGNOSIS — F329 Major depressive disorder, single episode, unspecified: Secondary | ICD-10-CM | POA: Diagnosis not present

## 2016-09-09 DIAGNOSIS — E1122 Type 2 diabetes mellitus with diabetic chronic kidney disease: Secondary | ICD-10-CM | POA: Diagnosis not present

## 2016-09-09 DIAGNOSIS — G8929 Other chronic pain: Secondary | ICD-10-CM | POA: Diagnosis not present

## 2016-09-09 DIAGNOSIS — M170 Bilateral primary osteoarthritis of knee: Secondary | ICD-10-CM | POA: Diagnosis not present

## 2016-09-09 DIAGNOSIS — I5032 Chronic diastolic (congestive) heart failure: Secondary | ICD-10-CM | POA: Diagnosis not present

## 2016-09-09 DIAGNOSIS — I132 Hypertensive heart and chronic kidney disease with heart failure and with stage 5 chronic kidney disease, or end stage renal disease: Secondary | ICD-10-CM | POA: Diagnosis not present

## 2016-09-09 DIAGNOSIS — N186 End stage renal disease: Secondary | ICD-10-CM | POA: Diagnosis not present

## 2016-09-09 DIAGNOSIS — Z9181 History of falling: Secondary | ICD-10-CM | POA: Diagnosis not present

## 2016-09-10 DIAGNOSIS — N186 End stage renal disease: Secondary | ICD-10-CM | POA: Diagnosis not present

## 2016-09-10 DIAGNOSIS — Z992 Dependence on renal dialysis: Secondary | ICD-10-CM | POA: Diagnosis not present

## 2016-09-11 DIAGNOSIS — Z992 Dependence on renal dialysis: Secondary | ICD-10-CM | POA: Diagnosis not present

## 2016-09-11 DIAGNOSIS — Z23 Encounter for immunization: Secondary | ICD-10-CM | POA: Diagnosis not present

## 2016-09-11 DIAGNOSIS — N186 End stage renal disease: Secondary | ICD-10-CM | POA: Diagnosis not present

## 2016-09-11 DIAGNOSIS — F0390 Unspecified dementia without behavioral disturbance: Secondary | ICD-10-CM | POA: Diagnosis not present

## 2016-09-12 DIAGNOSIS — F0390 Unspecified dementia without behavioral disturbance: Secondary | ICD-10-CM | POA: Diagnosis not present

## 2016-09-13 DIAGNOSIS — Z23 Encounter for immunization: Secondary | ICD-10-CM | POA: Diagnosis not present

## 2016-09-13 DIAGNOSIS — N186 End stage renal disease: Secondary | ICD-10-CM | POA: Diagnosis not present

## 2016-09-13 DIAGNOSIS — Z992 Dependence on renal dialysis: Secondary | ICD-10-CM | POA: Diagnosis not present

## 2016-09-13 DIAGNOSIS — F0390 Unspecified dementia without behavioral disturbance: Secondary | ICD-10-CM | POA: Diagnosis not present

## 2016-09-14 DIAGNOSIS — I5032 Chronic diastolic (congestive) heart failure: Secondary | ICD-10-CM | POA: Diagnosis not present

## 2016-09-14 DIAGNOSIS — J449 Chronic obstructive pulmonary disease, unspecified: Secondary | ICD-10-CM | POA: Diagnosis not present

## 2016-09-14 DIAGNOSIS — R296 Repeated falls: Secondary | ICD-10-CM | POA: Diagnosis not present

## 2016-09-14 DIAGNOSIS — R269 Unspecified abnormalities of gait and mobility: Secondary | ICD-10-CM | POA: Diagnosis not present

## 2016-09-14 DIAGNOSIS — I132 Hypertensive heart and chronic kidney disease with heart failure and with stage 5 chronic kidney disease, or end stage renal disease: Secondary | ICD-10-CM | POA: Diagnosis not present

## 2016-09-14 DIAGNOSIS — F329 Major depressive disorder, single episode, unspecified: Secondary | ICD-10-CM | POA: Diagnosis not present

## 2016-09-14 DIAGNOSIS — R0602 Shortness of breath: Secondary | ICD-10-CM | POA: Diagnosis not present

## 2016-09-14 DIAGNOSIS — F0390 Unspecified dementia without behavioral disturbance: Secondary | ICD-10-CM | POA: Diagnosis not present

## 2016-09-14 DIAGNOSIS — G8929 Other chronic pain: Secondary | ICD-10-CM | POA: Diagnosis not present

## 2016-09-14 DIAGNOSIS — R2681 Unsteadiness on feet: Secondary | ICD-10-CM | POA: Diagnosis not present

## 2016-09-14 DIAGNOSIS — M170 Bilateral primary osteoarthritis of knee: Secondary | ICD-10-CM | POA: Diagnosis not present

## 2016-09-14 DIAGNOSIS — I509 Heart failure, unspecified: Secondary | ICD-10-CM | POA: Diagnosis not present

## 2016-09-14 DIAGNOSIS — Z9181 History of falling: Secondary | ICD-10-CM | POA: Diagnosis not present

## 2016-09-14 DIAGNOSIS — J441 Chronic obstructive pulmonary disease with (acute) exacerbation: Secondary | ICD-10-CM | POA: Diagnosis not present

## 2016-09-14 DIAGNOSIS — J9610 Chronic respiratory failure, unspecified whether with hypoxia or hypercapnia: Secondary | ICD-10-CM | POA: Diagnosis not present

## 2016-09-14 DIAGNOSIS — E1122 Type 2 diabetes mellitus with diabetic chronic kidney disease: Secondary | ICD-10-CM | POA: Diagnosis not present

## 2016-09-14 DIAGNOSIS — N186 End stage renal disease: Secondary | ICD-10-CM | POA: Diagnosis not present

## 2016-09-15 ENCOUNTER — Telehealth: Payer: Self-pay

## 2016-09-15 DIAGNOSIS — Z992 Dependence on renal dialysis: Secondary | ICD-10-CM | POA: Diagnosis not present

## 2016-09-15 DIAGNOSIS — N186 End stage renal disease: Secondary | ICD-10-CM | POA: Diagnosis not present

## 2016-09-15 DIAGNOSIS — Z23 Encounter for immunization: Secondary | ICD-10-CM | POA: Diagnosis not present

## 2016-09-15 DIAGNOSIS — F0390 Unspecified dementia without behavioral disturbance: Secondary | ICD-10-CM | POA: Diagnosis not present

## 2016-09-15 NOTE — Telephone Encounter (Signed)
FYI: Danny from [facility] called and stated [name] will be discharged 9/4 unless on dialysis then she will be discharged on 09/16/2016 from homehealth. Kasandra Knudsen states patient has met  all of her goals

## 2016-09-15 NOTE — Telephone Encounter (Signed)
noted 

## 2016-09-16 DIAGNOSIS — F0390 Unspecified dementia without behavioral disturbance: Secondary | ICD-10-CM | POA: Diagnosis not present

## 2016-09-17 DIAGNOSIS — Z23 Encounter for immunization: Secondary | ICD-10-CM | POA: Diagnosis not present

## 2016-09-17 DIAGNOSIS — N186 End stage renal disease: Secondary | ICD-10-CM | POA: Diagnosis not present

## 2016-09-17 DIAGNOSIS — Z992 Dependence on renal dialysis: Secondary | ICD-10-CM | POA: Diagnosis not present

## 2016-09-17 DIAGNOSIS — F0390 Unspecified dementia without behavioral disturbance: Secondary | ICD-10-CM | POA: Diagnosis not present

## 2016-09-18 DIAGNOSIS — F0390 Unspecified dementia without behavioral disturbance: Secondary | ICD-10-CM | POA: Diagnosis not present

## 2016-09-19 DIAGNOSIS — F0390 Unspecified dementia without behavioral disturbance: Secondary | ICD-10-CM | POA: Diagnosis not present

## 2016-09-20 ENCOUNTER — Telehealth: Payer: Self-pay | Admitting: Family Medicine

## 2016-09-20 DIAGNOSIS — N186 End stage renal disease: Secondary | ICD-10-CM | POA: Diagnosis not present

## 2016-09-20 DIAGNOSIS — Z992 Dependence on renal dialysis: Secondary | ICD-10-CM | POA: Diagnosis not present

## 2016-09-20 DIAGNOSIS — Z23 Encounter for immunization: Secondary | ICD-10-CM | POA: Diagnosis not present

## 2016-09-20 DIAGNOSIS — F0390 Unspecified dementia without behavioral disturbance: Secondary | ICD-10-CM | POA: Diagnosis not present

## 2016-09-20 NOTE — Telephone Encounter (Signed)
New Message  BP 128/68 after dialysis, will call back tomorrow when no dialysis

## 2016-09-20 NOTE — Telephone Encounter (Signed)
I called this patient to charge nurse today at [facility]. Semi-blood pressure readings for [name] for the past month. Her blood pressure since August 18 have ranged from 178-234/93-111 most of these for over 222 systolic and I was never alerted by phone. I just received a random fax today asking what I suggested for her blood pressure. I have the nurse check her MAR to make sure she did have both her Imud 60 mg as well as her Cardizem 180 mg which was noted. She states that also may use automatic cuff and does not feel like the staff as the trained enough to take the blood pressures correctly. She is going to have a meeting this afternoon. She called back with manual blood pressure reading which was normal. Advised her to when she was in the office her blood pressure was only 140s on 8/21 but at nay rate I think that these readings are incorrect for a rate if they do get high reading this should be reported urgently. No change to BP meds

## 2016-09-20 NOTE — Telephone Encounter (Signed)
pls see note below

## 2016-09-21 DIAGNOSIS — F0390 Unspecified dementia without behavioral disturbance: Secondary | ICD-10-CM | POA: Diagnosis not present

## 2016-09-22 DIAGNOSIS — Z992 Dependence on renal dialysis: Secondary | ICD-10-CM | POA: Diagnosis not present

## 2016-09-22 DIAGNOSIS — Z23 Encounter for immunization: Secondary | ICD-10-CM | POA: Diagnosis not present

## 2016-09-22 DIAGNOSIS — N186 End stage renal disease: Secondary | ICD-10-CM | POA: Diagnosis not present

## 2016-09-22 DIAGNOSIS — F0390 Unspecified dementia without behavioral disturbance: Secondary | ICD-10-CM | POA: Diagnosis not present

## 2016-09-23 DIAGNOSIS — N186 End stage renal disease: Secondary | ICD-10-CM | POA: Diagnosis not present

## 2016-09-23 DIAGNOSIS — Z23 Encounter for immunization: Secondary | ICD-10-CM | POA: Diagnosis not present

## 2016-09-23 DIAGNOSIS — Z992 Dependence on renal dialysis: Secondary | ICD-10-CM | POA: Diagnosis not present

## 2016-09-23 DIAGNOSIS — F0390 Unspecified dementia without behavioral disturbance: Secondary | ICD-10-CM | POA: Diagnosis not present

## 2016-09-24 DIAGNOSIS — Z23 Encounter for immunization: Secondary | ICD-10-CM | POA: Diagnosis not present

## 2016-09-24 DIAGNOSIS — N186 End stage renal disease: Secondary | ICD-10-CM | POA: Diagnosis not present

## 2016-09-24 DIAGNOSIS — Z992 Dependence on renal dialysis: Secondary | ICD-10-CM | POA: Diagnosis not present

## 2016-09-24 DIAGNOSIS — F0390 Unspecified dementia without behavioral disturbance: Secondary | ICD-10-CM | POA: Diagnosis not present

## 2016-09-25 DIAGNOSIS — F0390 Unspecified dementia without behavioral disturbance: Secondary | ICD-10-CM | POA: Diagnosis not present

## 2016-09-26 DIAGNOSIS — F0390 Unspecified dementia without behavioral disturbance: Secondary | ICD-10-CM | POA: Diagnosis not present

## 2016-09-27 DIAGNOSIS — Z23 Encounter for immunization: Secondary | ICD-10-CM | POA: Diagnosis not present

## 2016-09-27 DIAGNOSIS — Z992 Dependence on renal dialysis: Secondary | ICD-10-CM | POA: Diagnosis not present

## 2016-09-27 DIAGNOSIS — F0390 Unspecified dementia without behavioral disturbance: Secondary | ICD-10-CM | POA: Diagnosis not present

## 2016-09-27 DIAGNOSIS — N186 End stage renal disease: Secondary | ICD-10-CM | POA: Diagnosis not present

## 2016-09-28 DIAGNOSIS — F0390 Unspecified dementia without behavioral disturbance: Secondary | ICD-10-CM | POA: Diagnosis not present

## 2016-09-29 DIAGNOSIS — F0390 Unspecified dementia without behavioral disturbance: Secondary | ICD-10-CM | POA: Diagnosis not present

## 2016-09-29 DIAGNOSIS — Z23 Encounter for immunization: Secondary | ICD-10-CM | POA: Diagnosis not present

## 2016-09-29 DIAGNOSIS — Z992 Dependence on renal dialysis: Secondary | ICD-10-CM | POA: Diagnosis not present

## 2016-09-29 DIAGNOSIS — N186 End stage renal disease: Secondary | ICD-10-CM | POA: Diagnosis not present

## 2016-09-30 DIAGNOSIS — F0390 Unspecified dementia without behavioral disturbance: Secondary | ICD-10-CM | POA: Diagnosis not present

## 2016-10-01 DIAGNOSIS — N186 End stage renal disease: Secondary | ICD-10-CM | POA: Diagnosis not present

## 2016-10-01 DIAGNOSIS — F0390 Unspecified dementia without behavioral disturbance: Secondary | ICD-10-CM | POA: Diagnosis not present

## 2016-10-01 DIAGNOSIS — Z992 Dependence on renal dialysis: Secondary | ICD-10-CM | POA: Diagnosis not present

## 2016-10-01 DIAGNOSIS — Z23 Encounter for immunization: Secondary | ICD-10-CM | POA: Diagnosis not present

## 2016-10-02 DIAGNOSIS — F0390 Unspecified dementia without behavioral disturbance: Secondary | ICD-10-CM | POA: Diagnosis not present

## 2016-10-03 DIAGNOSIS — F0390 Unspecified dementia without behavioral disturbance: Secondary | ICD-10-CM | POA: Diagnosis not present

## 2016-10-04 DIAGNOSIS — E119 Type 2 diabetes mellitus without complications: Secondary | ICD-10-CM | POA: Diagnosis not present

## 2016-10-04 DIAGNOSIS — Z23 Encounter for immunization: Secondary | ICD-10-CM | POA: Diagnosis not present

## 2016-10-04 DIAGNOSIS — E785 Hyperlipidemia, unspecified: Secondary | ICD-10-CM | POA: Diagnosis not present

## 2016-10-04 DIAGNOSIS — F0390 Unspecified dementia without behavioral disturbance: Secondary | ICD-10-CM | POA: Diagnosis not present

## 2016-10-04 DIAGNOSIS — Z992 Dependence on renal dialysis: Secondary | ICD-10-CM | POA: Diagnosis not present

## 2016-10-04 DIAGNOSIS — N186 End stage renal disease: Secondary | ICD-10-CM | POA: Diagnosis not present

## 2016-10-04 DIAGNOSIS — Z79899 Other long term (current) drug therapy: Secondary | ICD-10-CM | POA: Diagnosis not present

## 2016-10-05 ENCOUNTER — Telehealth: Payer: Self-pay | Admitting: Family Medicine

## 2016-10-05 DIAGNOSIS — F0390 Unspecified dementia without behavioral disturbance: Secondary | ICD-10-CM | POA: Diagnosis not present

## 2016-10-05 DIAGNOSIS — H34812 Central retinal vein occlusion, left eye, with macular edema: Secondary | ICD-10-CM | POA: Diagnosis not present

## 2016-10-05 DIAGNOSIS — H35371 Puckering of macula, right eye: Secondary | ICD-10-CM | POA: Diagnosis not present

## 2016-10-05 DIAGNOSIS — H43813 Vitreous degeneration, bilateral: Secondary | ICD-10-CM | POA: Diagnosis not present

## 2016-10-05 DIAGNOSIS — E113393 Type 2 diabetes mellitus with moderate nonproliferative diabetic retinopathy without macular edema, bilateral: Secondary | ICD-10-CM | POA: Diagnosis not present

## 2016-10-05 NOTE — Telephone Encounter (Signed)
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  Says pt goes to dialysis 3 x week and is there something you can recommend without having to bring her in for appt.

## 2016-10-05 NOTE — Telephone Encounter (Signed)
I am not sure what a diabetic boil is referring to  If she has an abscess she needs to be seen it may need to be drained If she has a sacral decbuitus ulcer that is different

## 2016-10-05 NOTE — Telephone Encounter (Signed)
Left message with care director that patient does need to be seen.  Please call for an appt.

## 2016-10-06 DIAGNOSIS — Z23 Encounter for immunization: Secondary | ICD-10-CM | POA: Diagnosis not present

## 2016-10-06 DIAGNOSIS — F0390 Unspecified dementia without behavioral disturbance: Secondary | ICD-10-CM | POA: Diagnosis not present

## 2016-10-06 DIAGNOSIS — Z992 Dependence on renal dialysis: Secondary | ICD-10-CM | POA: Diagnosis not present

## 2016-10-06 DIAGNOSIS — N186 End stage renal disease: Secondary | ICD-10-CM | POA: Diagnosis not present

## 2016-10-07 DIAGNOSIS — F0390 Unspecified dementia without behavioral disturbance: Secondary | ICD-10-CM | POA: Diagnosis not present

## 2016-10-08 DIAGNOSIS — Z23 Encounter for immunization: Secondary | ICD-10-CM | POA: Diagnosis not present

## 2016-10-08 DIAGNOSIS — F0390 Unspecified dementia without behavioral disturbance: Secondary | ICD-10-CM | POA: Diagnosis not present

## 2016-10-08 DIAGNOSIS — N186 End stage renal disease: Secondary | ICD-10-CM | POA: Diagnosis not present

## 2016-10-08 DIAGNOSIS — Z992 Dependence on renal dialysis: Secondary | ICD-10-CM | POA: Diagnosis not present

## 2016-10-09 DIAGNOSIS — F0390 Unspecified dementia without behavioral disturbance: Secondary | ICD-10-CM | POA: Diagnosis not present

## 2016-10-10 DIAGNOSIS — N186 End stage renal disease: Secondary | ICD-10-CM | POA: Diagnosis not present

## 2016-10-10 DIAGNOSIS — Z992 Dependence on renal dialysis: Secondary | ICD-10-CM | POA: Diagnosis not present

## 2016-10-10 DIAGNOSIS — F0390 Unspecified dementia without behavioral disturbance: Secondary | ICD-10-CM | POA: Diagnosis not present

## 2016-10-11 DIAGNOSIS — F0390 Unspecified dementia without behavioral disturbance: Secondary | ICD-10-CM | POA: Diagnosis not present

## 2016-10-11 DIAGNOSIS — Z992 Dependence on renal dialysis: Secondary | ICD-10-CM | POA: Diagnosis not present

## 2016-10-11 DIAGNOSIS — N186 End stage renal disease: Secondary | ICD-10-CM | POA: Diagnosis not present

## 2016-10-12 ENCOUNTER — Ambulatory Visit (INDEPENDENT_AMBULATORY_CARE_PROVIDER_SITE_OTHER): Payer: Medicare HMO | Admitting: Family Medicine

## 2016-10-12 VITALS — BP 158/68 | HR 78 | Temp 98.4°F | Resp 20 | Wt 176.0 lb

## 2016-10-12 DIAGNOSIS — L918 Other hypertrophic disorders of the skin: Secondary | ICD-10-CM | POA: Diagnosis not present

## 2016-10-12 DIAGNOSIS — B958 Unspecified staphylococcus as the cause of diseases classified elsewhere: Secondary | ICD-10-CM

## 2016-10-12 DIAGNOSIS — F0390 Unspecified dementia without behavioral disturbance: Secondary | ICD-10-CM | POA: Diagnosis not present

## 2016-10-12 DIAGNOSIS — D489 Neoplasm of uncertain behavior, unspecified: Secondary | ICD-10-CM

## 2016-10-12 DIAGNOSIS — B372 Candidiasis of skin and nail: Secondary | ICD-10-CM | POA: Diagnosis not present

## 2016-10-12 MED ORDER — NYSTATIN 100000 UNIT/GM EX POWD: Freq: Four times a day (QID) | CUTANEOUS | 2 refills | Status: DC

## 2016-10-12 MED ORDER — CEPHALEXIN 250 MG PO CAPS: 250.0000 mg | ORAL_CAPSULE | Freq: Two times a day (BID) | ORAL | 0 refills | Status: DC

## 2016-10-12 NOTE — Patient Instructions (Signed)
F/U1 week for suture removal  Take antibiotics  Use nystatin powder

## 2016-10-12 NOTE — Progress Notes (Signed)
   Subjective:    Patient ID: [name], female    DOB: [birth year], 83 y.o.   MRN: [id number]  Patient presents for Boil in [place] area Patient here with her daughter from the nursing home. She is complaining of pain and discomfort in her vaginal area. It noticed blood in drainage on her underwear. She says there is particular spot that hurts worse. Denies any difficulty with her breathing and no chest pain. Her blood pressure has been elevated however came down after dialysis yesterday started states that they called her when night to tell her that it was elevated. There is been no change in her blood pressure medication.  There is nothing specific written from the nursing home today.  He has been having difficulty with her eyes they've been changing her drops recently. She has an appointment this afternoon if she continues to have redness and drainage and swelling from her left eye which her daughter and patient states has been ongoing  Review Of Systems:per above  GEN- denies fatigue, fever, weight loss,weakness, recent illness HEENT- denies eye drainage, change in vision, nasal discharge, CVS- denies chest pain, palpitations RESP- denies SOB, cough, wheeze ABD- denies N/V, change in stools, abd pain GU- denies dysuria, hematuria, dribbling, incontinence MSK- denies joint pain, muscle aches, injury Neuro- denies headache, dizziness, syncope, seizure activity       Objective:    BP (!) 158/68   Pulse 78   Temp 98.4 F (36.9 C) (Oral)   Resp 20   Wt 176 lb (79.8 kg)   LMP 12/24/2010   SpO2 97%   BMI 28.41 kg/m  GEN- NAD, alert and oriented x3    164/84 HEENT- PERRL, EOMI, left injected sclera , mil swelling left upper lid, tearing of both eyes , pink conjunctiva, MMM, oropharynx clear CVS- RRR,  3/6 SEM  RESP-CTAB ABD-NABS,soft,NT,ND GU- hypopigmented nickle size lesion on right labia with erythema, TTP, right groin erythema with maceration, drainage, panties with  serosangioness drainage stained. TTP along groin no discrete abscess/boil  EXT- No edema Pulses- Radial 2+  Procedure- Neoplasm Right labia  Procedure explained to patient questions answered benefits and risks discussed verbal consent obtained. Antiseptic-betadine Anesthesia-lidocaine 1% 11 blade used to remove at base  Minimal blood loss but persistant oozing from drysol and, silver nitrate touched but would not stop, 2- 4-0 ethilon interrupted sutures placed for hemostasis and closure  Patient tolerated procedure well Bandage applied        Assessment & Plan:      Problem List Items Addressed This Visit    None    Visit Diagnoses    Neoplasm, uncertain whether benign or malignant    -  Primary   Pathology sent for lesion, Not sure why this was causing so much pain, f/u 1 week for suture removal   Relevant Orders   Pathology Report   Candidal intertrigo       Candidasis in groin with superinfection she has been scratching relentlessy,cover for staph as well, nystatin powder and keflex, wound care orders sent to ALF   Relevant Medications   cephALEXin (KEFLEX) 250 MG capsule   nystatin (MYCOSTATIN/NYSTOP) powder   Staph infection       Relevant Medications   cephALEXin (KEFLEX) 250 MG capsule   nystatin (MYCOSTATIN/NYSTOP) powder      Note: This dictation was prepared with Dragon dictation along with smaller phrase technology. Any transcriptional errors that result from this process are unintentional.

## 2016-10-13 DIAGNOSIS — L84 Corns and callosities: Secondary | ICD-10-CM | POA: Diagnosis not present

## 2016-10-13 DIAGNOSIS — Z992 Dependence on renal dialysis: Secondary | ICD-10-CM | POA: Diagnosis not present

## 2016-10-13 DIAGNOSIS — E1051 Type 1 diabetes mellitus with diabetic peripheral angiopathy without gangrene: Secondary | ICD-10-CM | POA: Diagnosis not present

## 2016-10-13 DIAGNOSIS — F0390 Unspecified dementia without behavioral disturbance: Secondary | ICD-10-CM | POA: Diagnosis not present

## 2016-10-13 DIAGNOSIS — N186 End stage renal disease: Secondary | ICD-10-CM | POA: Diagnosis not present

## 2016-10-14 DIAGNOSIS — R0602 Shortness of breath: Secondary | ICD-10-CM | POA: Diagnosis not present

## 2016-10-14 DIAGNOSIS — F0390 Unspecified dementia without behavioral disturbance: Secondary | ICD-10-CM | POA: Diagnosis not present

## 2016-10-14 DIAGNOSIS — R269 Unspecified abnormalities of gait and mobility: Secondary | ICD-10-CM | POA: Diagnosis not present

## 2016-10-14 DIAGNOSIS — J449 Chronic obstructive pulmonary disease, unspecified: Secondary | ICD-10-CM | POA: Diagnosis not present

## 2016-10-14 DIAGNOSIS — I509 Heart failure, unspecified: Secondary | ICD-10-CM | POA: Diagnosis not present

## 2016-10-14 DIAGNOSIS — I5032 Chronic diastolic (congestive) heart failure: Secondary | ICD-10-CM | POA: Diagnosis not present

## 2016-10-14 DIAGNOSIS — R2681 Unsteadiness on feet: Secondary | ICD-10-CM | POA: Diagnosis not present

## 2016-10-14 DIAGNOSIS — R296 Repeated falls: Secondary | ICD-10-CM | POA: Diagnosis not present

## 2016-10-14 DIAGNOSIS — J9610 Chronic respiratory failure, unspecified whether with hypoxia or hypercapnia: Secondary | ICD-10-CM | POA: Diagnosis not present

## 2016-10-14 LAB — TISSUE SPECIMEN

## 2016-10-14 LAB — PATHOLOGY

## 2016-10-15 DIAGNOSIS — N186 End stage renal disease: Secondary | ICD-10-CM | POA: Diagnosis not present

## 2016-10-15 DIAGNOSIS — F0390 Unspecified dementia without behavioral disturbance: Secondary | ICD-10-CM | POA: Diagnosis not present

## 2016-10-15 DIAGNOSIS — Z992 Dependence on renal dialysis: Secondary | ICD-10-CM | POA: Diagnosis not present

## 2016-10-16 DIAGNOSIS — F0390 Unspecified dementia without behavioral disturbance: Secondary | ICD-10-CM | POA: Diagnosis not present

## 2016-10-17 DIAGNOSIS — F0390 Unspecified dementia without behavioral disturbance: Secondary | ICD-10-CM | POA: Diagnosis not present

## 2016-10-18 DIAGNOSIS — F0390 Unspecified dementia without behavioral disturbance: Secondary | ICD-10-CM | POA: Diagnosis not present

## 2016-10-18 DIAGNOSIS — N186 End stage renal disease: Secondary | ICD-10-CM | POA: Diagnosis not present

## 2016-10-18 DIAGNOSIS — Z992 Dependence on renal dialysis: Secondary | ICD-10-CM | POA: Diagnosis not present

## 2016-10-19 ENCOUNTER — Encounter: Payer: Self-pay | Admitting: Family Medicine

## 2016-10-19 ENCOUNTER — Ambulatory Visit (INDEPENDENT_AMBULATORY_CARE_PROVIDER_SITE_OTHER): Payer: Medicare HMO | Admitting: Family Medicine

## 2016-10-19 VITALS — BP 172/88 | HR 92 | Temp 99.0°F | Resp 20 | Wt 177.0 lb

## 2016-10-19 DIAGNOSIS — B372 Candidiasis of skin and nail: Secondary | ICD-10-CM | POA: Diagnosis not present

## 2016-10-19 DIAGNOSIS — Z4802 Encounter for removal of sutures: Secondary | ICD-10-CM | POA: Diagnosis not present

## 2016-10-19 DIAGNOSIS — B958 Unspecified staphylococcus as the cause of diseases classified elsewhere: Secondary | ICD-10-CM | POA: Diagnosis not present

## 2016-10-19 DIAGNOSIS — L089 Local infection of the skin and subcutaneous tissue, unspecified: Secondary | ICD-10-CM

## 2016-10-19 DIAGNOSIS — I10 Essential (primary) hypertension: Secondary | ICD-10-CM | POA: Diagnosis not present

## 2016-10-19 DIAGNOSIS — F0390 Unspecified dementia without behavioral disturbance: Secondary | ICD-10-CM | POA: Diagnosis not present

## 2016-10-19 NOTE — Progress Notes (Signed)
   Subjective:    Patient ID: [name], female    DOB: [birth year], 83 y.o.   MRN: [id number]  Patient presents for Follow-up  Patient here for one-week interim follow-up on a growth on her labia that was removed this came back as a benign polyp, she is due for suture removal today  She also had intertrigo which was superinfected in the groin she was treated with nystatin powder as well as Keflex She states she feels much better, her skin is clearing up, no itching and pain  Note her eye was also red and swollen she did not have any new meds prescribed at her eye visit last week, that is also clear    Review Of Systems:  GEN- denies fatigue, fever, weight loss,weakness, recent illness HEENT- denies eye drainage, change in vision, nasal discharge, CVS- denies chest pain, palpitations RESP- denies SOB, cough, wheeze ABD- denies N/V, change in stools, abd pain GU- denies dysuria, hematuria, dribbling, incontinence MSK- denies joint pain, muscle aches, injury Neuro- denies headache, dizziness, syncope, seizure activity            Objective:    BP (!) 172/88   Pulse 92   Temp 99 F (37.2 C) (Oral)   Resp 20   Wt 177 lb (80.3 kg)   LMP 12/24/2010   SpO2 96%   BMI 28.57 kg/m  GEN- NAD, alert and oriented x3    HEENT- PERRL, EOMI, left injected sclera , mil swelling left upper lid, tearing of both eyes , pink conjunctiva, MMM, oropharynx clear CVS- RRR,  3/6 SEM  RESP-CTAB ABD-NABS,soft,NT,ND GU- Right groin hyperpigmented, no open lesions, minimal erythema, NT, sutures in tact- removed at bedside  EXT- No edema Pulses- Radial 2+         Assessment & Plan:      Problem List Items Addressed This Visit      Unprioritized   Essential hypertension    BP remains elevated but drops on HD days. She states her nephrologist is aware I am concerned about raising her BP meds and leading to more hypotension so will not change, she will see nephrology tomorrow        Other Visit Diagnoses    Candidal intertrigo    -  Primary   much improved, staph superinfection is resolved, continue use of the nystatin powder   Visit for suture removal       Staph skin infection

## 2016-10-19 NOTE — Patient Instructions (Signed)
F/U as previous 

## 2016-10-19 NOTE — Assessment & Plan Note (Signed)
BP remains elevated but drops on HD days. She states her nephrologist is aware I am concerned about raising her BP meds and leading to more hypotension so will not change, she will see nephrology tomorrow

## 2016-10-20 DIAGNOSIS — F0390 Unspecified dementia without behavioral disturbance: Secondary | ICD-10-CM | POA: Diagnosis not present

## 2016-10-20 DIAGNOSIS — Z992 Dependence on renal dialysis: Secondary | ICD-10-CM | POA: Diagnosis not present

## 2016-10-20 DIAGNOSIS — N186 End stage renal disease: Secondary | ICD-10-CM | POA: Diagnosis not present

## 2016-10-21 DIAGNOSIS — F0390 Unspecified dementia without behavioral disturbance: Secondary | ICD-10-CM | POA: Diagnosis not present

## 2016-10-22 DIAGNOSIS — F0390 Unspecified dementia without behavioral disturbance: Secondary | ICD-10-CM | POA: Diagnosis not present

## 2016-10-22 DIAGNOSIS — Z992 Dependence on renal dialysis: Secondary | ICD-10-CM | POA: Diagnosis not present

## 2016-10-22 DIAGNOSIS — N186 End stage renal disease: Secondary | ICD-10-CM | POA: Diagnosis not present

## 2016-10-23 DIAGNOSIS — F0390 Unspecified dementia without behavioral disturbance: Secondary | ICD-10-CM | POA: Diagnosis not present

## 2016-10-24 DIAGNOSIS — F0390 Unspecified dementia without behavioral disturbance: Secondary | ICD-10-CM | POA: Diagnosis not present

## 2016-10-24 DIAGNOSIS — N186 End stage renal disease: Secondary | ICD-10-CM | POA: Diagnosis not present

## 2016-10-24 DIAGNOSIS — Z992 Dependence on renal dialysis: Secondary | ICD-10-CM | POA: Diagnosis not present

## 2016-10-25 DIAGNOSIS — N186 End stage renal disease: Secondary | ICD-10-CM | POA: Diagnosis not present

## 2016-10-25 DIAGNOSIS — Z992 Dependence on renal dialysis: Secondary | ICD-10-CM | POA: Diagnosis not present

## 2016-10-25 DIAGNOSIS — F0390 Unspecified dementia without behavioral disturbance: Secondary | ICD-10-CM | POA: Diagnosis not present

## 2016-10-26 DIAGNOSIS — F0390 Unspecified dementia without behavioral disturbance: Secondary | ICD-10-CM | POA: Diagnosis not present

## 2016-10-27 DIAGNOSIS — Z992 Dependence on renal dialysis: Secondary | ICD-10-CM | POA: Diagnosis not present

## 2016-10-27 DIAGNOSIS — N186 End stage renal disease: Secondary | ICD-10-CM | POA: Diagnosis not present

## 2016-10-27 DIAGNOSIS — F0390 Unspecified dementia without behavioral disturbance: Secondary | ICD-10-CM | POA: Diagnosis not present

## 2016-10-28 DIAGNOSIS — F0390 Unspecified dementia without behavioral disturbance: Secondary | ICD-10-CM | POA: Diagnosis not present

## 2016-10-29 DIAGNOSIS — Z992 Dependence on renal dialysis: Secondary | ICD-10-CM | POA: Diagnosis not present

## 2016-10-29 DIAGNOSIS — N186 End stage renal disease: Secondary | ICD-10-CM | POA: Diagnosis not present

## 2016-10-29 DIAGNOSIS — F0390 Unspecified dementia without behavioral disturbance: Secondary | ICD-10-CM | POA: Diagnosis not present

## 2016-10-30 DIAGNOSIS — F0390 Unspecified dementia without behavioral disturbance: Secondary | ICD-10-CM | POA: Diagnosis not present

## 2016-10-31 DIAGNOSIS — F0390 Unspecified dementia without behavioral disturbance: Secondary | ICD-10-CM | POA: Diagnosis not present

## 2016-11-01 DIAGNOSIS — N186 End stage renal disease: Secondary | ICD-10-CM | POA: Diagnosis not present

## 2016-11-01 DIAGNOSIS — Z992 Dependence on renal dialysis: Secondary | ICD-10-CM | POA: Diagnosis not present

## 2016-11-01 DIAGNOSIS — F0390 Unspecified dementia without behavioral disturbance: Secondary | ICD-10-CM | POA: Diagnosis not present

## 2016-11-02 DIAGNOSIS — E113293 Type 2 diabetes mellitus with mild nonproliferative diabetic retinopathy without macular edema, bilateral: Secondary | ICD-10-CM | POA: Diagnosis not present

## 2016-11-02 DIAGNOSIS — F0390 Unspecified dementia without behavioral disturbance: Secondary | ICD-10-CM | POA: Diagnosis not present

## 2016-11-02 DIAGNOSIS — H43813 Vitreous degeneration, bilateral: Secondary | ICD-10-CM | POA: Diagnosis not present

## 2016-11-02 DIAGNOSIS — H34812 Central retinal vein occlusion, left eye, with macular edema: Secondary | ICD-10-CM | POA: Diagnosis not present

## 2016-11-02 DIAGNOSIS — H35371 Puckering of macula, right eye: Secondary | ICD-10-CM | POA: Diagnosis not present

## 2016-11-03 DIAGNOSIS — F0390 Unspecified dementia without behavioral disturbance: Secondary | ICD-10-CM | POA: Diagnosis not present

## 2016-11-03 DIAGNOSIS — Z992 Dependence on renal dialysis: Secondary | ICD-10-CM | POA: Diagnosis not present

## 2016-11-03 DIAGNOSIS — N186 End stage renal disease: Secondary | ICD-10-CM | POA: Diagnosis not present

## 2016-11-04 DIAGNOSIS — F0390 Unspecified dementia without behavioral disturbance: Secondary | ICD-10-CM | POA: Diagnosis not present

## 2016-11-05 DIAGNOSIS — N186 End stage renal disease: Secondary | ICD-10-CM | POA: Diagnosis not present

## 2016-11-05 DIAGNOSIS — Z992 Dependence on renal dialysis: Secondary | ICD-10-CM | POA: Diagnosis not present

## 2016-11-05 DIAGNOSIS — F0390 Unspecified dementia without behavioral disturbance: Secondary | ICD-10-CM | POA: Diagnosis not present

## 2016-11-06 DIAGNOSIS — F0390 Unspecified dementia without behavioral disturbance: Secondary | ICD-10-CM | POA: Diagnosis not present

## 2016-11-07 DIAGNOSIS — F0390 Unspecified dementia without behavioral disturbance: Secondary | ICD-10-CM | POA: Diagnosis not present

## 2016-11-08 DIAGNOSIS — F0390 Unspecified dementia without behavioral disturbance: Secondary | ICD-10-CM | POA: Diagnosis not present

## 2016-11-08 DIAGNOSIS — Z992 Dependence on renal dialysis: Secondary | ICD-10-CM | POA: Diagnosis not present

## 2016-11-08 DIAGNOSIS — N186 End stage renal disease: Secondary | ICD-10-CM | POA: Diagnosis not present

## 2016-11-09 DIAGNOSIS — F0390 Unspecified dementia without behavioral disturbance: Secondary | ICD-10-CM | POA: Diagnosis not present

## 2016-11-09 DIAGNOSIS — N186 End stage renal disease: Secondary | ICD-10-CM | POA: Diagnosis not present

## 2016-11-09 DIAGNOSIS — Z992 Dependence on renal dialysis: Secondary | ICD-10-CM | POA: Diagnosis not present

## 2016-11-10 DIAGNOSIS — Z992 Dependence on renal dialysis: Secondary | ICD-10-CM | POA: Diagnosis not present

## 2016-11-10 DIAGNOSIS — F0390 Unspecified dementia without behavioral disturbance: Secondary | ICD-10-CM | POA: Diagnosis not present

## 2016-11-10 DIAGNOSIS — N186 End stage renal disease: Secondary | ICD-10-CM | POA: Diagnosis not present

## 2016-11-11 DIAGNOSIS — F0390 Unspecified dementia without behavioral disturbance: Secondary | ICD-10-CM | POA: Diagnosis not present

## 2016-11-11 DIAGNOSIS — N186 End stage renal disease: Secondary | ICD-10-CM | POA: Diagnosis not present

## 2016-11-11 DIAGNOSIS — Z992 Dependence on renal dialysis: Secondary | ICD-10-CM | POA: Diagnosis not present

## 2016-11-12 DIAGNOSIS — F0390 Unspecified dementia without behavioral disturbance: Secondary | ICD-10-CM | POA: Diagnosis not present

## 2016-11-12 DIAGNOSIS — Z992 Dependence on renal dialysis: Secondary | ICD-10-CM | POA: Diagnosis not present

## 2016-11-12 DIAGNOSIS — N186 End stage renal disease: Secondary | ICD-10-CM | POA: Diagnosis not present

## 2016-11-13 DIAGNOSIS — F0390 Unspecified dementia without behavioral disturbance: Secondary | ICD-10-CM | POA: Diagnosis not present

## 2016-11-14 DIAGNOSIS — J9610 Chronic respiratory failure, unspecified whether with hypoxia or hypercapnia: Secondary | ICD-10-CM | POA: Diagnosis not present

## 2016-11-14 DIAGNOSIS — F0390 Unspecified dementia without behavioral disturbance: Secondary | ICD-10-CM | POA: Diagnosis not present

## 2016-11-14 DIAGNOSIS — R0602 Shortness of breath: Secondary | ICD-10-CM | POA: Diagnosis not present

## 2016-11-14 DIAGNOSIS — I509 Heart failure, unspecified: Secondary | ICD-10-CM | POA: Diagnosis not present

## 2016-11-14 DIAGNOSIS — J449 Chronic obstructive pulmonary disease, unspecified: Secondary | ICD-10-CM | POA: Diagnosis not present

## 2016-11-14 DIAGNOSIS — R296 Repeated falls: Secondary | ICD-10-CM | POA: Diagnosis not present

## 2016-11-14 DIAGNOSIS — R269 Unspecified abnormalities of gait and mobility: Secondary | ICD-10-CM | POA: Diagnosis not present

## 2016-11-14 DIAGNOSIS — I5032 Chronic diastolic (congestive) heart failure: Secondary | ICD-10-CM | POA: Diagnosis not present

## 2016-11-14 DIAGNOSIS — R2681 Unsteadiness on feet: Secondary | ICD-10-CM | POA: Diagnosis not present

## 2016-11-15 DIAGNOSIS — F0390 Unspecified dementia without behavioral disturbance: Secondary | ICD-10-CM | POA: Diagnosis not present

## 2016-11-15 DIAGNOSIS — N186 End stage renal disease: Secondary | ICD-10-CM | POA: Diagnosis not present

## 2016-11-15 DIAGNOSIS — Z992 Dependence on renal dialysis: Secondary | ICD-10-CM | POA: Diagnosis not present

## 2016-11-16 DIAGNOSIS — F0390 Unspecified dementia without behavioral disturbance: Secondary | ICD-10-CM | POA: Diagnosis not present

## 2016-11-17 DIAGNOSIS — F0390 Unspecified dementia without behavioral disturbance: Secondary | ICD-10-CM | POA: Diagnosis not present

## 2016-11-17 DIAGNOSIS — N186 End stage renal disease: Secondary | ICD-10-CM | POA: Diagnosis not present

## 2016-11-17 DIAGNOSIS — Z992 Dependence on renal dialysis: Secondary | ICD-10-CM | POA: Diagnosis not present

## 2016-11-18 ENCOUNTER — Other Ambulatory Visit: Payer: Self-pay | Admitting: *Deleted

## 2016-11-18 DIAGNOSIS — F0390 Unspecified dementia without behavioral disturbance: Secondary | ICD-10-CM | POA: Diagnosis not present

## 2016-11-18 MED ORDER — NYSTATIN 100000 UNIT/GM EX POWD: Freq: Four times a day (QID) | CUTANEOUS | 2 refills | Status: DC | PRN

## 2016-11-19 DIAGNOSIS — Z992 Dependence on renal dialysis: Secondary | ICD-10-CM | POA: Diagnosis not present

## 2016-11-19 DIAGNOSIS — N186 End stage renal disease: Secondary | ICD-10-CM | POA: Diagnosis not present

## 2016-11-19 DIAGNOSIS — F0390 Unspecified dementia without behavioral disturbance: Secondary | ICD-10-CM | POA: Diagnosis not present

## 2016-11-20 DIAGNOSIS — F0390 Unspecified dementia without behavioral disturbance: Secondary | ICD-10-CM | POA: Diagnosis not present

## 2016-11-21 DIAGNOSIS — F0390 Unspecified dementia without behavioral disturbance: Secondary | ICD-10-CM | POA: Diagnosis not present

## 2016-11-22 DIAGNOSIS — F0390 Unspecified dementia without behavioral disturbance: Secondary | ICD-10-CM | POA: Diagnosis not present

## 2016-11-22 DIAGNOSIS — N186 End stage renal disease: Secondary | ICD-10-CM | POA: Diagnosis not present

## 2016-11-22 DIAGNOSIS — Z992 Dependence on renal dialysis: Secondary | ICD-10-CM | POA: Diagnosis not present

## 2016-11-23 DIAGNOSIS — F0390 Unspecified dementia without behavioral disturbance: Secondary | ICD-10-CM | POA: Diagnosis not present

## 2016-11-23 DIAGNOSIS — N186 End stage renal disease: Secondary | ICD-10-CM | POA: Diagnosis not present

## 2016-11-23 DIAGNOSIS — Z992 Dependence on renal dialysis: Secondary | ICD-10-CM | POA: Diagnosis not present

## 2016-11-24 DIAGNOSIS — Z992 Dependence on renal dialysis: Secondary | ICD-10-CM | POA: Diagnosis not present

## 2016-11-24 DIAGNOSIS — F0390 Unspecified dementia without behavioral disturbance: Secondary | ICD-10-CM | POA: Diagnosis not present

## 2016-11-24 DIAGNOSIS — N186 End stage renal disease: Secondary | ICD-10-CM | POA: Diagnosis not present

## 2016-11-25 DIAGNOSIS — F0390 Unspecified dementia without behavioral disturbance: Secondary | ICD-10-CM | POA: Diagnosis not present

## 2016-11-26 DIAGNOSIS — F0390 Unspecified dementia without behavioral disturbance: Secondary | ICD-10-CM | POA: Diagnosis not present

## 2016-11-26 DIAGNOSIS — N186 End stage renal disease: Secondary | ICD-10-CM | POA: Diagnosis not present

## 2016-11-26 DIAGNOSIS — Z992 Dependence on renal dialysis: Secondary | ICD-10-CM | POA: Diagnosis not present

## 2016-11-27 DIAGNOSIS — Z992 Dependence on renal dialysis: Secondary | ICD-10-CM | POA: Diagnosis not present

## 2016-11-27 DIAGNOSIS — N186 End stage renal disease: Secondary | ICD-10-CM | POA: Diagnosis not present

## 2016-11-27 DIAGNOSIS — F0390 Unspecified dementia without behavioral disturbance: Secondary | ICD-10-CM | POA: Diagnosis not present

## 2016-11-28 DIAGNOSIS — F0390 Unspecified dementia without behavioral disturbance: Secondary | ICD-10-CM | POA: Diagnosis not present

## 2016-11-29 DIAGNOSIS — N186 End stage renal disease: Secondary | ICD-10-CM | POA: Diagnosis not present

## 2016-11-29 DIAGNOSIS — F0390 Unspecified dementia without behavioral disturbance: Secondary | ICD-10-CM | POA: Diagnosis not present

## 2016-11-29 DIAGNOSIS — Z992 Dependence on renal dialysis: Secondary | ICD-10-CM | POA: Diagnosis not present

## 2016-11-30 DIAGNOSIS — H34812 Central retinal vein occlusion, left eye, with macular edema: Secondary | ICD-10-CM | POA: Diagnosis not present

## 2016-11-30 DIAGNOSIS — E113311 Type 2 diabetes mellitus with moderate nonproliferative diabetic retinopathy with macular edema, right eye: Secondary | ICD-10-CM | POA: Diagnosis not present

## 2016-11-30 DIAGNOSIS — E113292 Type 2 diabetes mellitus with mild nonproliferative diabetic retinopathy without macular edema, left eye: Secondary | ICD-10-CM | POA: Diagnosis not present

## 2016-11-30 DIAGNOSIS — F0390 Unspecified dementia without behavioral disturbance: Secondary | ICD-10-CM | POA: Diagnosis not present

## 2016-11-30 DIAGNOSIS — H35371 Puckering of macula, right eye: Secondary | ICD-10-CM | POA: Diagnosis not present

## 2016-12-01 DIAGNOSIS — F0390 Unspecified dementia without behavioral disturbance: Secondary | ICD-10-CM | POA: Diagnosis not present

## 2016-12-01 DIAGNOSIS — Z992 Dependence on renal dialysis: Secondary | ICD-10-CM | POA: Diagnosis not present

## 2016-12-01 DIAGNOSIS — N186 End stage renal disease: Secondary | ICD-10-CM | POA: Diagnosis not present

## 2016-12-02 DIAGNOSIS — F0390 Unspecified dementia without behavioral disturbance: Secondary | ICD-10-CM | POA: Diagnosis not present

## 2016-12-03 DIAGNOSIS — Z992 Dependence on renal dialysis: Secondary | ICD-10-CM | POA: Diagnosis not present

## 2016-12-03 DIAGNOSIS — F0390 Unspecified dementia without behavioral disturbance: Secondary | ICD-10-CM | POA: Diagnosis not present

## 2016-12-03 DIAGNOSIS — N186 End stage renal disease: Secondary | ICD-10-CM | POA: Diagnosis not present

## 2016-12-04 DIAGNOSIS — F0390 Unspecified dementia without behavioral disturbance: Secondary | ICD-10-CM | POA: Diagnosis not present

## 2016-12-05 DIAGNOSIS — F0390 Unspecified dementia without behavioral disturbance: Secondary | ICD-10-CM | POA: Diagnosis not present

## 2016-12-06 DIAGNOSIS — N186 End stage renal disease: Secondary | ICD-10-CM | POA: Diagnosis not present

## 2016-12-06 DIAGNOSIS — F0390 Unspecified dementia without behavioral disturbance: Secondary | ICD-10-CM | POA: Diagnosis not present

## 2016-12-06 DIAGNOSIS — Z992 Dependence on renal dialysis: Secondary | ICD-10-CM | POA: Diagnosis not present

## 2016-12-07 DIAGNOSIS — F0390 Unspecified dementia without behavioral disturbance: Secondary | ICD-10-CM | POA: Diagnosis not present

## 2016-12-08 DIAGNOSIS — F0390 Unspecified dementia without behavioral disturbance: Secondary | ICD-10-CM | POA: Diagnosis not present

## 2016-12-08 DIAGNOSIS — Z992 Dependence on renal dialysis: Secondary | ICD-10-CM | POA: Diagnosis not present

## 2016-12-08 DIAGNOSIS — N186 End stage renal disease: Secondary | ICD-10-CM | POA: Diagnosis not present

## 2016-12-09 DIAGNOSIS — F0390 Unspecified dementia without behavioral disturbance: Secondary | ICD-10-CM | POA: Diagnosis not present

## 2016-12-10 DIAGNOSIS — Z992 Dependence on renal dialysis: Secondary | ICD-10-CM | POA: Diagnosis not present

## 2016-12-10 DIAGNOSIS — F0390 Unspecified dementia without behavioral disturbance: Secondary | ICD-10-CM | POA: Diagnosis not present

## 2016-12-10 DIAGNOSIS — N186 End stage renal disease: Secondary | ICD-10-CM | POA: Diagnosis not present

## 2016-12-11 DIAGNOSIS — N186 End stage renal disease: Secondary | ICD-10-CM | POA: Diagnosis not present

## 2016-12-11 DIAGNOSIS — F0390 Unspecified dementia without behavioral disturbance: Secondary | ICD-10-CM | POA: Diagnosis not present

## 2016-12-11 DIAGNOSIS — Z992 Dependence on renal dialysis: Secondary | ICD-10-CM | POA: Diagnosis not present

## 2016-12-12 DIAGNOSIS — F0390 Unspecified dementia without behavioral disturbance: Secondary | ICD-10-CM | POA: Diagnosis not present

## 2016-12-13 DIAGNOSIS — F0390 Unspecified dementia without behavioral disturbance: Secondary | ICD-10-CM | POA: Diagnosis not present

## 2016-12-13 DIAGNOSIS — N186 End stage renal disease: Secondary | ICD-10-CM | POA: Diagnosis not present

## 2016-12-13 DIAGNOSIS — Z992 Dependence on renal dialysis: Secondary | ICD-10-CM | POA: Diagnosis not present

## 2016-12-14 DIAGNOSIS — R296 Repeated falls: Secondary | ICD-10-CM | POA: Diagnosis not present

## 2016-12-14 DIAGNOSIS — I5032 Chronic diastolic (congestive) heart failure: Secondary | ICD-10-CM | POA: Diagnosis not present

## 2016-12-14 DIAGNOSIS — J9610 Chronic respiratory failure, unspecified whether with hypoxia or hypercapnia: Secondary | ICD-10-CM | POA: Diagnosis not present

## 2016-12-14 DIAGNOSIS — R269 Unspecified abnormalities of gait and mobility: Secondary | ICD-10-CM | POA: Diagnosis not present

## 2016-12-14 DIAGNOSIS — R0602 Shortness of breath: Secondary | ICD-10-CM | POA: Diagnosis not present

## 2016-12-14 DIAGNOSIS — J449 Chronic obstructive pulmonary disease, unspecified: Secondary | ICD-10-CM | POA: Diagnosis not present

## 2016-12-14 DIAGNOSIS — I509 Heart failure, unspecified: Secondary | ICD-10-CM | POA: Diagnosis not present

## 2016-12-14 DIAGNOSIS — R2681 Unsteadiness on feet: Secondary | ICD-10-CM | POA: Diagnosis not present

## 2016-12-14 DIAGNOSIS — F0390 Unspecified dementia without behavioral disturbance: Secondary | ICD-10-CM | POA: Diagnosis not present

## 2016-12-15 DIAGNOSIS — Z992 Dependence on renal dialysis: Secondary | ICD-10-CM | POA: Diagnosis not present

## 2016-12-15 DIAGNOSIS — N186 End stage renal disease: Secondary | ICD-10-CM | POA: Diagnosis not present

## 2016-12-15 DIAGNOSIS — F0390 Unspecified dementia without behavioral disturbance: Secondary | ICD-10-CM | POA: Diagnosis not present

## 2016-12-16 DIAGNOSIS — F0390 Unspecified dementia without behavioral disturbance: Secondary | ICD-10-CM | POA: Diagnosis not present

## 2016-12-17 DIAGNOSIS — N186 End stage renal disease: Secondary | ICD-10-CM | POA: Diagnosis not present

## 2016-12-17 DIAGNOSIS — Z992 Dependence on renal dialysis: Secondary | ICD-10-CM | POA: Diagnosis not present

## 2016-12-17 DIAGNOSIS — F0390 Unspecified dementia without behavioral disturbance: Secondary | ICD-10-CM | POA: Diagnosis not present

## 2016-12-18 DIAGNOSIS — F0390 Unspecified dementia without behavioral disturbance: Secondary | ICD-10-CM | POA: Diagnosis not present

## 2016-12-18 DIAGNOSIS — N186 End stage renal disease: Secondary | ICD-10-CM | POA: Diagnosis not present

## 2016-12-18 DIAGNOSIS — Z992 Dependence on renal dialysis: Secondary | ICD-10-CM | POA: Diagnosis not present

## 2016-12-19 DIAGNOSIS — F0390 Unspecified dementia without behavioral disturbance: Secondary | ICD-10-CM | POA: Diagnosis not present

## 2016-12-20 DIAGNOSIS — F0390 Unspecified dementia without behavioral disturbance: Secondary | ICD-10-CM | POA: Diagnosis not present

## 2016-12-21 DIAGNOSIS — N186 End stage renal disease: Secondary | ICD-10-CM | POA: Diagnosis not present

## 2016-12-21 DIAGNOSIS — F0390 Unspecified dementia without behavioral disturbance: Secondary | ICD-10-CM | POA: Diagnosis not present

## 2016-12-21 DIAGNOSIS — Z992 Dependence on renal dialysis: Secondary | ICD-10-CM | POA: Diagnosis not present

## 2016-12-22 DIAGNOSIS — F0390 Unspecified dementia without behavioral disturbance: Secondary | ICD-10-CM | POA: Diagnosis not present

## 2016-12-22 DIAGNOSIS — N186 End stage renal disease: Secondary | ICD-10-CM | POA: Diagnosis not present

## 2016-12-22 DIAGNOSIS — Z992 Dependence on renal dialysis: Secondary | ICD-10-CM | POA: Diagnosis not present

## 2016-12-23 DIAGNOSIS — F0390 Unspecified dementia without behavioral disturbance: Secondary | ICD-10-CM | POA: Diagnosis not present

## 2016-12-24 DIAGNOSIS — N186 End stage renal disease: Secondary | ICD-10-CM | POA: Diagnosis not present

## 2016-12-24 DIAGNOSIS — F0390 Unspecified dementia without behavioral disturbance: Secondary | ICD-10-CM | POA: Diagnosis not present

## 2016-12-24 DIAGNOSIS — Z992 Dependence on renal dialysis: Secondary | ICD-10-CM | POA: Diagnosis not present

## 2016-12-25 DIAGNOSIS — F0390 Unspecified dementia without behavioral disturbance: Secondary | ICD-10-CM | POA: Diagnosis not present

## 2016-12-26 DIAGNOSIS — F0390 Unspecified dementia without behavioral disturbance: Secondary | ICD-10-CM | POA: Diagnosis not present

## 2016-12-27 DIAGNOSIS — E119 Type 2 diabetes mellitus without complications: Secondary | ICD-10-CM | POA: Diagnosis not present

## 2016-12-27 DIAGNOSIS — F0390 Unspecified dementia without behavioral disturbance: Secondary | ICD-10-CM | POA: Diagnosis not present

## 2016-12-27 DIAGNOSIS — Z79899 Other long term (current) drug therapy: Secondary | ICD-10-CM | POA: Diagnosis not present

## 2016-12-27 DIAGNOSIS — N186 End stage renal disease: Secondary | ICD-10-CM | POA: Diagnosis not present

## 2016-12-27 DIAGNOSIS — E785 Hyperlipidemia, unspecified: Secondary | ICD-10-CM | POA: Diagnosis not present

## 2016-12-27 DIAGNOSIS — Z992 Dependence on renal dialysis: Secondary | ICD-10-CM | POA: Diagnosis not present

## 2016-12-28 ENCOUNTER — Ambulatory Visit: Payer: Medicare HMO | Admitting: Family Medicine

## 2016-12-28 DIAGNOSIS — F0390 Unspecified dementia without behavioral disturbance: Secondary | ICD-10-CM | POA: Diagnosis not present

## 2016-12-29 DIAGNOSIS — F0390 Unspecified dementia without behavioral disturbance: Secondary | ICD-10-CM | POA: Diagnosis not present

## 2016-12-29 DIAGNOSIS — Z992 Dependence on renal dialysis: Secondary | ICD-10-CM | POA: Diagnosis not present

## 2016-12-29 DIAGNOSIS — L84 Corns and callosities: Secondary | ICD-10-CM | POA: Diagnosis not present

## 2016-12-29 DIAGNOSIS — E1051 Type 1 diabetes mellitus with diabetic peripheral angiopathy without gangrene: Secondary | ICD-10-CM | POA: Diagnosis not present

## 2016-12-29 DIAGNOSIS — N186 End stage renal disease: Secondary | ICD-10-CM | POA: Diagnosis not present

## 2016-12-30 ENCOUNTER — Encounter (INDEPENDENT_AMBULATORY_CARE_PROVIDER_SITE_OTHER): Payer: Self-pay | Admitting: Internal Medicine

## 2016-12-30 ENCOUNTER — Encounter (HOSPITAL_COMMUNITY): Payer: Self-pay | Admitting: Emergency Medicine

## 2016-12-30 ENCOUNTER — Observation Stay (HOSPITAL_COMMUNITY)
Admission: EM | Admit: 2016-12-30 | Discharge: 2016-12-31 | Disposition: A | Discharge status: Home / Self Care | Payer: Medicare HMO | Attending: Internal Medicine | Admitting: Internal Medicine

## 2016-12-30 ENCOUNTER — Ambulatory Visit (INDEPENDENT_AMBULATORY_CARE_PROVIDER_SITE_OTHER): Payer: Medicare HMO | Admitting: Internal Medicine

## 2016-12-30 ENCOUNTER — Other Ambulatory Visit: Payer: Self-pay

## 2016-12-30 ENCOUNTER — Emergency Department (HOSPITAL_COMMUNITY): Payer: Medicare HMO

## 2016-12-30 VITALS — BP 160/74 | HR 84 | Temp 97.4°F | Ht 66.0 in | Wt 179.3 lb

## 2016-12-30 DIAGNOSIS — D591 Other autoimmune hemolytic anemias: Secondary | ICD-10-CM | POA: Diagnosis not present

## 2016-12-30 DIAGNOSIS — Z992 Dependence on renal dialysis: Secondary | ICD-10-CM | POA: Insufficient documentation

## 2016-12-30 DIAGNOSIS — E1129 Type 2 diabetes mellitus with other diabetic kidney complication: Secondary | ICD-10-CM | POA: Diagnosis present

## 2016-12-30 DIAGNOSIS — N186 End stage renal disease: Secondary | ICD-10-CM | POA: Insufficient documentation

## 2016-12-30 DIAGNOSIS — J9610 Chronic respiratory failure, unspecified whether with hypoxia or hypercapnia: Secondary | ICD-10-CM | POA: Diagnosis present

## 2016-12-30 DIAGNOSIS — E785 Hyperlipidemia, unspecified: Secondary | ICD-10-CM | POA: Insufficient documentation

## 2016-12-30 DIAGNOSIS — N189 Chronic kidney disease, unspecified: Secondary | ICD-10-CM

## 2016-12-30 DIAGNOSIS — D508 Other iron deficiency anemias: Secondary | ICD-10-CM | POA: Diagnosis not present

## 2016-12-30 DIAGNOSIS — I10 Essential (primary) hypertension: Secondary | ICD-10-CM | POA: Diagnosis present

## 2016-12-30 DIAGNOSIS — R0602 Shortness of breath: Secondary | ICD-10-CM | POA: Insufficient documentation

## 2016-12-30 DIAGNOSIS — J449 Chronic obstructive pulmonary disease, unspecified: Secondary | ICD-10-CM | POA: Diagnosis not present

## 2016-12-30 DIAGNOSIS — E039 Hypothyroidism, unspecified: Secondary | ICD-10-CM | POA: Diagnosis not present

## 2016-12-30 DIAGNOSIS — I5032 Chronic diastolic (congestive) heart failure: Secondary | ICD-10-CM | POA: Diagnosis not present

## 2016-12-30 DIAGNOSIS — D631 Anemia in chronic kidney disease: Secondary | ICD-10-CM | POA: Diagnosis not present

## 2016-12-30 DIAGNOSIS — D649 Anemia, unspecified: Secondary | ICD-10-CM | POA: Diagnosis not present

## 2016-12-30 DIAGNOSIS — E1122 Type 2 diabetes mellitus with diabetic chronic kidney disease: Secondary | ICD-10-CM | POA: Insufficient documentation

## 2016-12-30 DIAGNOSIS — I132 Hypertensive heart and chronic kidney disease with heart failure and with stage 5 chronic kidney disease, or end stage renal disease: Secondary | ICD-10-CM | POA: Diagnosis not present

## 2016-12-30 DIAGNOSIS — Z7982 Long term (current) use of aspirin: Secondary | ICD-10-CM | POA: Insufficient documentation

## 2016-12-30 DIAGNOSIS — R69 Illness, unspecified: Secondary | ICD-10-CM | POA: Diagnosis not present

## 2016-12-30 DIAGNOSIS — Z79899 Other long term (current) drug therapy: Secondary | ICD-10-CM | POA: Insufficient documentation

## 2016-12-30 DIAGNOSIS — R5383 Other fatigue: Secondary | ICD-10-CM | POA: Diagnosis present

## 2016-12-30 LAB — COMPREHENSIVE METABOLIC PANEL
ALBUMIN: 3.3 g/dL — AB (ref 3.5–5.0)
ALT: 17 U/L (ref 14–54)
AST: 21 U/L (ref 15–41)
Alkaline Phosphatase: 130 U/L — ABNORMAL HIGH (ref 38–126)
Anion gap: 11 (ref 5–15)
BUN: 37 mg/dL — AB (ref 6–20)
CHLORIDE: 98 mmol/L — AB (ref 101–111)
CO2: 30 mmol/L (ref 22–32)
CREATININE: 6.22 mg/dL — AB (ref 0.44–1.00)
Calcium: 8.8 mg/dL — ABNORMAL LOW (ref 8.9–10.3)
GFR calc Af Amer: 6 mL/min — ABNORMAL LOW (ref >60)
GFR calc non Af Amer: 5 mL/min — ABNORMAL LOW (ref >60)
GLUCOSE: 123 mg/dL — AB (ref 65–99)
POTASSIUM: 3.6 mmol/L (ref 3.5–5.1)
Sodium: 139 mmol/L (ref 135–145)
Total Bilirubin: 0.4 mg/dL (ref 0.3–1.2)
Total Protein: 6.3 g/dL — ABNORMAL LOW (ref 6.5–8.1)

## 2016-12-30 LAB — PREPARE RBC (CROSSMATCH)

## 2016-12-30 LAB — IRON, TOTAL/TOTAL IRON BINDING CAP
%SAT: 35 % (calc) (ref 11–50)
IRON: 72 ug/dL (ref 45–160)
TIBC: 205 ug/dL — AB (ref 250–450)

## 2016-12-30 LAB — CBC WITH DIFFERENTIAL/PLATELET
BASOS PCT: 0 %
Basophils Absolute: 41 cells/uL (ref 0–200)
Basophils Absolute: 0 10*3/uL (ref 0.0–0.1)
Basophils Relative: 0.6 %
EOS ABS: 0 10*3/uL (ref 0.0–0.7)
EOS PCT: 0.3 %
Eosinophils Absolute: 20 cells/uL (ref 15–500)
Eosinophils Relative: 1 %
HCT: 22.1 % — ABNORMAL LOW (ref 36.0–46.0)
HEMATOCRIT: 23.4 % — AB (ref 35.0–45.0)
HEMOGLOBIN: 7.3 g/dL — AB (ref 11.7–15.5)
HEMOGLOBIN: 6.9 g/dL — AB (ref 12.0–15.0)
LYMPHS ABS: 1278 {cells}/uL (ref 850–3900)
Lymphocytes Relative: 26 %
Lymphs Abs: 1.6 10*3/uL (ref 0.7–4.0)
MCH: 25 pg — ABNORMAL LOW (ref 27.0–33.0)
MCH: 25.3 pg — ABNORMAL LOW (ref 26.0–34.0)
MCHC: 31.2 g/dL — AB (ref 32.0–36.0)
MCHC: 31.2 g/dL (ref 30.0–36.0)
MCV: 80.1 fL (ref 80.0–100.0)
MCV: 81 fL (ref 78.0–100.0)
MPV: 10.5 fL (ref 7.5–12.5)
Monocytes Absolute: 0.5 10*3/uL (ref 0.1–1.0)
Monocytes Relative: 8.7 %
Monocytes Relative: 9 %
NEUTROS ABS: 4869 {cells}/uL (ref 1500–7800)
NEUTROS ABS: 3.9 10*3/uL (ref 1.7–7.7)
NEUTROS PCT: 64 %
Neutrophils Relative %: 71.6 %
Platelets: 198 10*3/uL (ref 140–400)
Platelets: 163 10*3/uL (ref 150–400)
RBC: 2.92 10*6/uL — AB (ref 3.80–5.10)
RBC: 2.73 MIL/uL — AB (ref 3.87–5.11)
RDW: 14.5 % (ref 11.0–15.0)
RDW: 16.3 % — ABNORMAL HIGH (ref 11.5–15.5)
Total Lymphocyte: 18.8 %
WBC: 6.8 10*3/uL (ref 3.8–10.8)
WBC: 6.1 10*3/uL (ref 4.0–10.5)
WBCMIX: 592 {cells}/uL (ref 200–950)

## 2016-12-30 LAB — TSH: TSH: 1.332 u[IU]/mL (ref 0.350–4.500)

## 2016-12-30 LAB — FERRITIN: Ferritin: 657 ng/mL — ABNORMAL HIGH (ref 20–288)

## 2016-12-30 MED ORDER — POLYETHYLENE GLYCOL 3350 17 G PO PACK: 17.0000 g | PACK | ORAL | Status: DC | PRN

## 2016-12-30 MED ORDER — LEVOTHYROXINE SODIUM 25 MCG PO TABS
25.0000 ug | ORAL_TABLET | Freq: Every day | ORAL | Status: DC
  Administered 2016-12-31: 25 ug via ORAL
  Filled 2016-12-30: qty 1

## 2016-12-30 MED ORDER — ACETAMINOPHEN 650 MG RE SUPP: 650.0000 mg | Freq: Four times a day (QID) | RECTAL | Status: DC | PRN

## 2016-12-30 MED ORDER — CITALOPRAM HYDROBROMIDE 20 MG PO TABS
20.0000 mg | ORAL_TABLET | Freq: Every day | ORAL | Status: DC
  Administered 2016-12-31: 20 mg via ORAL
  Filled 2016-12-30: qty 1

## 2016-12-30 MED ORDER — NEPRO/CARBSTEADY PO LIQD: 237.0000 mL | Freq: Three times a day (TID) | ORAL | Status: DC | PRN

## 2016-12-30 MED ORDER — POLYVINYL ALCOHOL 1.4 % OP SOLN: 1.0000 [drp] | OPHTHALMIC | Status: DC | PRN

## 2016-12-30 MED ORDER — TRAMADOL HCL 50 MG PO TABS: 50.0000 mg | ORAL_TABLET | Freq: Two times a day (BID) | ORAL | Status: DC | PRN

## 2016-12-30 MED ORDER — PANTOPRAZOLE SODIUM 40 MG PO TBEC
40.0000 mg | DELAYED_RELEASE_TABLET | Freq: Every day | ORAL | Status: DC
  Administered 2016-12-31: 40 mg via ORAL
  Filled 2016-12-30: qty 1

## 2016-12-30 MED ORDER — MIRTAZAPINE 15 MG PO TABS
7.5000 mg | ORAL_TABLET | Freq: Every day | ORAL | Status: DC
  Administered 2016-12-31: 7.5 mg via ORAL
  Filled 2016-12-30: qty 1

## 2016-12-30 MED ORDER — ALBUTEROL SULFATE (2.5 MG/3ML) 0.083% IN NEBU: 2.5000 mg | INHALATION_SOLUTION | Freq: Four times a day (QID) | RESPIRATORY_TRACT | Status: DC | PRN

## 2016-12-30 MED ORDER — SODIUM CHLORIDE 0.9 % IV SOLN
10.0000 mL/h | Freq: Once | INTRAVENOUS | Status: AC
  Administered 2016-12-30: 10 mL/h via INTRAVENOUS

## 2016-12-30 MED ORDER — SEVELAMER CARBONATE 800 MG PO TABS: 800.0000 mg | ORAL_TABLET | Freq: Every day | ORAL | Status: DC

## 2016-12-30 MED ORDER — DOCUSATE SODIUM 100 MG PO CAPS
100.0000 mg | ORAL_CAPSULE | Freq: Two times a day (BID) | ORAL | Status: DC
  Administered 2016-12-31 (×2): 100 mg via ORAL
  Filled 2016-12-30 (×2): qty 1

## 2016-12-30 MED ORDER — DILTIAZEM HCL ER COATED BEADS 180 MG PO CP24
180.0000 mg | ORAL_CAPSULE | Freq: Every day | ORAL | Status: DC
Start: 2016-12-31 — End: 2017-01-01
  Administered 2016-12-31: 180 mg via ORAL
  Filled 2016-12-30: qty 1

## 2016-12-30 MED ORDER — CINACALCET HCL 30 MG PO TABS
30.0000 mg | ORAL_TABLET | Freq: Every day | ORAL | Status: DC
  Administered 2016-12-31: 30 mg via ORAL
  Filled 2016-12-30: qty 1

## 2016-12-30 MED ORDER — ACETAMINOPHEN 325 MG PO TABS: 650.0000 mg | ORAL_TABLET | Freq: Four times a day (QID) | ORAL | Status: DC | PRN

## 2016-12-30 MED ORDER — ENOXAPARIN SODIUM 30 MG/0.3ML ~~LOC~~ SOLN
30.0000 mg | SUBCUTANEOUS | Status: DC
  Administered 2016-12-31: 30 mg via SUBCUTANEOUS
  Filled 2016-12-30: qty 0.3

## 2016-12-30 MED ORDER — ASPIRIN 81 MG PO CHEW
81.0000 mg | CHEWABLE_TABLET | Freq: Every day | ORAL | Status: DC
  Administered 2016-12-31: 81 mg via ORAL
  Filled 2016-12-30: qty 1

## 2016-12-30 MED ORDER — ONDANSETRON HCL 4 MG/2ML IJ SOLN: 4.0000 mg | Freq: Four times a day (QID) | INTRAMUSCULAR | Status: DC | PRN

## 2016-12-30 MED ORDER — SEVELAMER CARBONATE 800 MG PO TABS: 800.0000 mg | ORAL_TABLET | Freq: Two times a day (BID) | ORAL | Status: DC | PRN

## 2016-12-30 MED ORDER — SORBITOL 70 % SOLN: 30.0000 mL | Status: DC | PRN

## 2016-12-30 MED ORDER — CALCIUM CARBONATE ANTACID 1250 MG/5ML PO SUSP
500.0000 mg | Freq: Four times a day (QID) | ORAL | Status: DC | PRN
  Filled 2016-12-30: qty 5

## 2016-12-30 MED ORDER — SEVELAMER CARBONATE 800 MG PO TABS
2400.0000 mg | ORAL_TABLET | Freq: Three times a day (TID) | ORAL | Status: DC
  Administered 2016-12-31 (×3): 2400 mg via ORAL
  Filled 2016-12-30 (×3): qty 3

## 2016-12-30 MED ORDER — ONDANSETRON HCL 4 MG PO TABS: 4.0000 mg | ORAL_TABLET | Freq: Four times a day (QID) | ORAL | Status: DC | PRN

## 2016-12-30 MED ORDER — CAMPHOR-MENTHOL 0.5-0.5 % EX LOTN
1.0000 "application " | TOPICAL_LOTION | Freq: Three times a day (TID) | CUTANEOUS | Status: DC | PRN
  Filled 2016-12-30: qty 222

## 2016-12-30 MED ORDER — DOCUSATE SODIUM 283 MG RE ENEM
1.0000 | ENEMA | RECTAL | Status: DC | PRN
  Filled 2016-12-30: qty 1

## 2016-12-30 MED ORDER — HYDROXYZINE HCL 25 MG PO TABS: 25.0000 mg | ORAL_TABLET | Freq: Three times a day (TID) | ORAL | Status: DC | PRN

## 2016-12-30 MED ORDER — ADULT MULTIVITAMIN W/MINERALS CH
1.0000 | ORAL_TABLET | Freq: Every day | ORAL | Status: DC
  Administered 2016-12-31: 1 via ORAL
  Filled 2016-12-30: qty 1

## 2016-12-30 MED ORDER — ISOSORBIDE MONONITRATE ER 60 MG PO TB24
60.0000 mg | ORAL_TABLET | Freq: Every day | ORAL | Status: DC
  Administered 2016-12-31: 60 mg via ORAL
  Filled 2016-12-30: qty 1

## 2016-12-30 NOTE — ED Triage Notes (Signed)
Pt sent from [facility] for eval of Hgb 7.3.  Last dialysis yesterday.  Pt denies complaints other than being tired.

## 2016-12-30 NOTE — Progress Notes (Addendum)
Subjective:    Patient ID: [name], female    DOB: 1925-01-28, 83 y.o.   MRN: [id number] Resident of [facility]. HPI Referred by [facility] for low hemoglobin. 10 to 7.7   Hs of CVA, IBS, hypertension,GERD, Diabetes, Depression. ESRD on dialysis, CHF. Takes dialysis on M-W-F. Shunt left arm. Has been on Dialysis x 5 yrs.  Resident of [place].  She denies seeing any rectal bleeding. No change in her BMs.  Her appetite is good.     12/27/2016 Hemoglobin 7.7, ferritin 689, Iron Sat 16, Iron 36, RIBC 221, UIBC 185, MCV 82.15 July 2016   Aug 2018 Sept 2018   Oct 2018  Nov 2018  Dec 2018  8.4  10.6       12.2  11.4  8.8 7.7      CBC    Component Value Date/Time   WBC 7.3 08/14/2016 0625   RBC 4.04 08/14/2016 0625   HGB 10.2 (L) 08/14/2016 0625   HCT 31.7 (L) 08/14/2016 0625   PLT 194 08/14/2016 0625   MCV 78.5 08/14/2016 0625   MCH 25.2 (L) 08/14/2016 0625   MCHC 32.2 08/14/2016 0625   RDW 15.7 (H) 08/14/2016 0625   LYMPHSABS 1,829 06/01/2016 1444   MONOABS 472 06/01/2016 1444   EOSABS 0 (L) 06/01/2016 1444   BASOSABS 59 06/01/2016 1444     Review of Systems Past Medical History:  Diagnosis Date  . Anemia   . Anemia in CKD (chronic kidney disease)   . Anxiety   . Arthritis    gout, right shoulder  . Cancer [facility])    ?stomach cancer   . Cataracts, both eyes   . CHF (congestive heart failure) ([place])   . CKD (chronic kidney disease), stage V ([place])    on HD on M/W/F  . Depression   . Diabetes mellitus, type 2 ([place])   . GERD (gastroesophageal reflux disease)   . Gynecologic cancer   . Hyperlipidemia   . Hypertension   . IBS (irritable bowel syndrome)    with costipation  . Intraoperative hemorrhage and hematoma of a digestive system organ or structure complicating a digestive system procedure   . Normal cardiac stress test 05/2015  . Overactive bladder   . Pneumonia    2 times in 2016  . SOB (shortness of breath)    nml  spirometry 09/05/06  . Stroke [facility])    residual right side weakness and pain  . SVT (supraventricular tachycardia) ([place])   . Syncope and collapse   . Thyroid disease     Past Surgical History:  Procedure Laterality Date  . AV FISTULA PLACEMENT, BRACHIOCEPHALIC  56/38/7564   left arm   by Dr. Bridgett Larsson  . COLONOSCOPY    . FISTULA SUPERFICIALIZATION Left 06/10/2015   Procedure: LEFT UPPER ARM BRACHIOCEPHALIC FISTULA PSEUDOANEURYSM PLICATION;  Surgeon: Conrad Taney, MD;  Location: [facility];  Service: Vascular;  Laterality: Left;  . IR THROMBECTOMY AV FISTULA W/THROMBOLYSIS/PTA INC/SHUNT/IMG LEFT Left 05/06/2016  . IR US GUIDE VASC ACCESS LEFT  05/06/2016  . PERIPHERAL VASCULAR CATHETERIZATION Left 05/15/2015   Procedure: Fistulagram;  Surgeon: Conrad [place], MD;  Location: [facility] CV [facility];  Service: Cardiovascular;  Laterality: Left;  . TEE WITHOUT CARDIOVERSION  2011   2/2 Strep Bovis infection  . VESICOVAGINAL FISTULA CLOSURE W/ TAH      No Known Allergies  Current Outpatient Medications on File Prior to Visit  Medication Sig  Dispense Refill  . albuterol (PROVENTIL) (2.5 MG/3ML) 0.083% nebulizer solution USE 1 VIAL IN NEBULIZER 3 TIMES DAILY AS NEEDED FOR WHEEZING/SHORTNESS OF BREATH. 360 mL 0  . aspirin 81 MG chewable tablet Chew by mouth daily.    . bisacodyl (DULCOLAX) 10 MG suppository 1 suppository rectally  Daily every 3 days as needed for constipation (Patient taking differently: Place 10 mg rectally daily as needed. ) 12 suppository 2  . citalopram (CELEXA) 20 MG tablet TAKE (1) TABLET BY MOUTH ONCE IN THE MORNING. 31 tablet 11  . diltiazem (CARDIZEM CD) 180 MG 24 hr capsule TAKE 1 CAPSULE BY MOUTH ONCE A DAY. 30 capsule 11  . docusate sodium (COLACE) 100 MG capsule Take 100 mg by mouth 2 (two) times daily.    Marland Kitchen guaiFENesin-dextromethorphan (ROBITUSSIN DM) 100-10 MG/5ML syrup TAKE ONE TEASPOONFUL BY MOUTH EVERY 4 HOURS AS NEEDED FOR COUGH 120 mL 2  . ipratropium (ATROVENT) 0.02 %  nebulizer solution USE 1 VIAL IN NEBULIZER 3 TIMES DAILY AS NEEDED FOR WHEEZING/SHORTNESS OF BREATH. 360 mL 0  . isosorbide mononitrate (IMDUR) 60 MG 24 hr tablet TAKE (1) TABLET BY MOUTH ONCE DAILY. 31 tablet 11  . levothyroxine (SYNTHROID, LEVOTHROID) 25 MCG tablet TAKE (1) TABLET BY MOUTH ONCE DAILY BEFORE BREAKFAST. 31 tablet 11  . lidocaine-prilocaine (EMLA) cream APPLY TOPICALLY TO LEFT UPPER ARM 1 HOUR PRIOR TO DIALYSIS ON MONDAY,WEDNESDAY & FRIDAYS AS NEEDED FOR PAIN. WRAP WITH PLASTIC WRAP AFTER A 30 g 11  . mirtazapine (REMERON) 15 MG tablet TAKE 1/2 TABLET (7.5MG ) BY MOUTH AT BEDTIME FOR MOOD/APPETITE. 16 tablet 11  . Multiple Vitamins-Iron (MULTIVITAMINS WITH IRON) TABS tablet Take 1 tablet by mouth daily.    . nitroGLYCERIN (NITROSTAT) 0.4 MG SL tablet DISSOLVE 1 TABLET UNDER THE TONGUE EVERY 5 MINUTES X2 DOSES THEN TO ER 25 tablet 11  . nystatin (MYCOSTATIN/NYSTOP) powder Apply 4 (four) times daily as needed topically. To groin/ abd folds for irritation 1 Bottle 2  . OXYGEN Inhale 2 L into the lungs daily as needed (shortness of breath).    . pantoprazole (PROTONIX) 40 MG tablet TAKE (1) TABLET BY MOUTH ONCE DAILY. 31 tablet 11  . polyethylene glycol powder (MIRALAX) powder Take 17 g by mouth as needed for mild constipation. Hold for diarrhea 250 g 2  . SENSIPAR 30 MG tablet TAKE 1 TABLET BY MOUTH ONCE DAILY AFTER THE EVENING MEAL 31 tablet PRN  . sevelamer (RENVELA) 800 MG tablet Take 800-2,400 mg by mouth 5 (five) times daily. Take 3 tablets  three times daily with meals and take 1 tablet twice daily with snacks    . traMADol (ULTRAM) 50 MG tablet Take 1 tablet (50 mg total) by mouth every 12 (twelve) hours as needed. 10 tablet 0  . Travoprost, BAK Free, (TRAVATAN) 0.004 % SOLN ophthalmic solution Place 1 drop into both eyes at bedtime. 5 mL 11  . VENTOLIN HFA 108 (90 Base) MCG/ACT inhaler INHALE (2) PUFFS EVERY 4 HOURS AS NEEDED FOR WHEEZING OR SHORTNESS OFBREATH(COUGH). 18 g 11  .  ARTIFICIAL TEARS 1.4 % ophthalmic solution INSTILL 1 DROP INTO EITHER EYE UP TO 6 TIMES DAILY AS NEEDED FOR LUBRICATION (Patient not taking: Reported on 12/30/2016) 15 mL 11  . Lancets MISC Use as directed to monitor FSBS 1x daily x2 weeks, then 1x daily PRN S/Sx hypoglycemia. Dx: E11.9. 50 each 1  . Multiple Vitamin (TAB-A-VITE) TABS Take 1 tablet by mouth daily.     No current facility-administered medications  on file prior to visit.         Objective:   Physical Exam  Blood pressure (!) 160/74, pulse 84, temperature (!) 97.4 F (36.3 C), height 5\' 6"  (1.676 m), weight 179 lb 4.8 oz (81.3 kg), last menstrual period 12/24/2010.   Alert and oriented. Skin warm and dry. Oral mucosa is moist.   . Sclera anicteric, conjunctivae is pink. Thyroid not enlarged. No cervical lymphadenopathy. Lungs clear. Heart regular rate and rhythm.  Abdomen is soft. Bowel sounds are positive. No hepatomegaly. No abdominal masses felt. No tenderness.  2+ edema to lower extremities.  Stool brown and guaiac negative.            Assessment & Plan:  Anemia. Am going to get a CBC and iron studies. 3 stools cards home with patient.  Pancreatic mass/cyst. Family is aware.

## 2016-12-30 NOTE — ED Notes (Signed)
Date and time results received: 12/30/16 5:45 PM  (use smartphrase ".now" to insert current time)  Test: hgb Critical Value: 6.9  Name of Provider Notified: Lita Mains  Orders Received? Or Actions Taken?: Orders Received - See Orders for details

## 2016-12-30 NOTE — ED Provider Notes (Signed)
[facility] EMERGENCY DEPARTMENT Provider Note   CSN: [id number] Arrival date & time: 12/30/16  1651     History   Chief Complaint Chief Complaint  Patient presents with  . Abnormal Lab    HPI [name] is a 83 y.o. female.  HPI Patient has a history of anemia and end-stage renal disease.  She is on hemodialysis Monday Wednesday Friday.  Last dialyzed was yesterday.  Was sent to gastroenterology office due to anemia.  Hemoccult was negative.  Patient had repeat CBC with hemoglobin level of 7.3.  Referred to the emergency department.  Patient states that she is having no pain.  She has some mild shortness of breath and fatigue.  She wears chronic home oxygen 2-3 L.  No new cough.  No chest pain.  Has not noticed any melanotic or grossly bloody stools.     Past Medical History:  Diagnosis Date  . Anemia   . Anemia in CKD (chronic kidney disease)   . Anxiety   . Arthritis    gout, right shoulder  . Cancer [facility])    ?stomach cancer   . Cataracts, both eyes   . CHF (congestive heart failure) ([place])   . CKD (chronic kidney disease), stage V ([place])    on HD on M/W/F  . Depression   . Diabetes mellitus, type 2 ([place])   . GERD (gastroesophageal reflux disease)   . Gynecologic cancer   . Hyperlipidemia   . Hypertension   . IBS (irritable bowel syndrome)    with costipation  . Intraoperative hemorrhage and hematoma of a digestive system organ or structure complicating a digestive system procedure   . Normal cardiac stress test 05/2015  . Overactive bladder   . Pneumonia    2 times in 2016  . SOB (shortness of breath)    nml spirometry 09/05/06  . Stroke [facility])    residual right side weakness and pain  . SVT (supraventricular tachycardia) ([place])   . Syncope and collapse   . Thyroid disease     Patient Active Problem List   Diagnosis Date Noted  . Symptomatic anemia 12/30/2016  . Pancreatic cyst 08/31/2016  . COPD with acute exacerbation ([place]) 08/11/2016  .  Essential hypertension 08/11/2016  . Chronic pain 08/11/2016  . Esophageal dysmotility 06/01/2016  . Dysphagia   . Chest pain 05/07/2016  . Chronic diastolic heart failure ([place]) 10/07/2015  . Fluid overload 10/02/2015  . Hyperglycemia 10/02/2015  . D-dimer, elevated   . Depression   . Pseudoaneurysm of arteriovenous dialysis fistula ([place]) 05/29/2015  . Traumatic hematoma of scalp 02/12/2014  . Decreased hearing of both ears 02/12/2014  . At high risk for falls 02/12/2014  . Leg swelling 09/18/2013  . OA (osteoarthritis) of knee 09/18/2013  . MDD (major depressive disorder) ([place]) 04/05/2013  . Mechanical complication of other vascular device, implant, and graft 11/14/2012  . Pain in limb-Left arm 07/11/2012  . Hypothyroidism 06/08/2012  . GERD (gastroesophageal reflux disease) 03/23/2012  . Gait instability 01/13/2012  . Neuropathy of hand 01/13/2012  . Breast mass, right 07/11/2011  . Hemorrhoid 03/14/2011  . ESRD (end stage renal disease) on dialysis ([place]) 02/12/2011  . Bradycardia 02/01/2011  . Dyspnea 01/17/2011  . Chronic respiratory failure ([place]) 01/13/2011  . Constipation 12/09/2010  . Insomnia 10/14/2010  . Diabetes mellitus with renal complications ([place]) 67/12/4578  . ALLERGIC RHINITIS 04/03/2008  . Benign hypertensive heart and kidney disease and CKD stage V ([place]) 08/07/2007  . DEPENDENT EDEMA,  LEGS, BILATERAL 11/17/2006  . HYPERLIPIDEMIA 10/10/2006  . Anemia in chronic kidney disease 10/10/2006  . ANXIETY 09/05/2006  . CATARACT NOS 09/05/2006  . IBS 09/05/2006  . ARTHRITIS 09/05/2006    Past Surgical History:  Procedure Laterality Date  . AV FISTULA PLACEMENT, BRACHIOCEPHALIC  70/17/7939   left arm   by Dr. Bridgett Larsson  . COLONOSCOPY    . FISTULA SUPERFICIALIZATION Left 06/10/2015   Procedure: LEFT UPPER ARM BRACHIOCEPHALIC FISTULA PSEUDOANEURYSM PLICATION;  Surgeon: Conrad [place], MD;  Location: [place];  Service: Vascular;  Laterality: Left;  . IR THROMBECTOMY AV  FISTULA W/THROMBOLYSIS/PTA INC/SHUNT/IMG LEFT Left 05/06/2016  . IR US GUIDE VASC ACCESS LEFT  05/06/2016  . PERIPHERAL VASCULAR CATHETERIZATION Left 05/15/2015   Procedure: Fistulagram;  Surgeon: Conrad Dows, MD;  Location: [place] [facility];  Service: Cardiovascular;  Laterality: Left;  . TEE WITHOUT CARDIOVERSION  2011   2/2 Strep Bovis infection  . VESICOVAGINAL FISTULA CLOSURE W/ TAH      OB History    No data available       Home Medications    Prior to Admission medications   Medication Sig Start Date End Date Taking? Authorizing Provider  ARTIFICIAL TEARS 1.4 % ophthalmic solution INSTILL 1 DROP INTO EITHER EYE UP TO 6 TIMES DAILY AS NEEDED FOR LUBRICATION 08/18/16  Yes [place], Modena Nunnery, MD  aspirin 81 MG chewable tablet Chew by mouth daily.   Yes [provider]  citalopram (CELEXA) 20 MG tablet TAKE (1) TABLET BY MOUTH ONCE IN THE MORNING. 03/24/16  Yes [place], Modena Nunnery, MD  diltiazem (CARDIZEM CD) 180 MG 24 hr capsule TAKE 1 CAPSULE BY MOUTH ONCE A DAY. 06/25/16  Yes [place], Modena Nunnery, MD  docusate sodium (COLACE) 100 MG capsule Take 100 mg by mouth 2 (two) times daily.   Yes [provider]  guaiFENesin-dextromethorphan (ROBITUSSIN DM) 100-10 MG/5ML syrup TAKE ONE TEASPOONFUL BY MOUTH EVERY 4 HOURS AS NEEDED FOR COUGH 02/17/16  Yes [place], Lonell Grandchild F, MD  ipratropium (ATROVENT) 0.02 % nebulizer solution USE 1 VIAL IN NEBULIZER 3 TIMES DAILY AS NEEDED FOR WHEEZING/SHORTNESS OF BREATH. 10/01/15  Yes [place], Modena Nunnery, MD  isosorbide mononitrate (IMDUR) 60 MG 24 hr tablet TAKE (1) TABLET BY MOUTH ONCE DAILY. 03/24/16  Yes [place], Modena Nunnery, MD  levothyroxine (SYNTHROID, LEVOTHROID) 25 MCG tablet TAKE (1) TABLET BY MOUTH ONCE DAILY BEFORE BREAKFAST. 03/24/16  Yes [place], Modena Nunnery, MD  lidocaine-prilocaine (EMLA) cream APPLY TOPICALLY TO LEFT UPPER ARM 1 HOUR PRIOR TO DIALYSIS ON MONDAY,WEDNESDAY & FRIDAYS AS NEEDED FOR PAIN. WRAP WITH PLASTIC WRAP AFTER A 03/23/16  Yes  [place], Modena Nunnery, MD  mirtazapine (REMERON) 15 MG tablet TAKE 1/2 TABLET (7.5MG ) BY MOUTH AT BEDTIME FOR MOOD/APPETITE. 03/24/16  Yes [place], Modena Nunnery, MD  Multiple Vitamin (TAB-A-VITE) TABS Take 1 tablet by mouth daily.   Yes [provider]  Multiple Vitamins-Iron (MULTIVITAMINS WITH IRON) TABS tablet Take 1 tablet by mouth daily.   Yes [provider]  nitroGLYCERIN (NITROSTAT) 0.4 MG SL tablet DISSOLVE 1 TABLET UNDER THE TONGUE EVERY 5 MINUTES X2 DOSES THEN TO ER 08/18/16  Yes [place], Modena Nunnery, MD  nystatin (MYCOSTATIN/NYSTOP) powder Apply 4 (four) times daily as needed topically. To groin/ abd folds for irritation 11/18/16  Yes [place], Modena Nunnery, MD  OXYGEN Inhale 2 L into the lungs daily as needed (shortness of breath).   Yes [provider]  pantoprazole (PROTONIX) 40 MG tablet TAKE (1) TABLET BY MOUTH  ONCE DAILY. 07/26/16  Yes [place], Modena Nunnery, MD  polyethylene glycol powder (MIRALAX) powder Take 17 g by mouth as needed for mild constipation. Hold for diarrhea 08/14/16  Yes [place], Ripudeep K, MD  SENSIPAR 30 MG tablet TAKE 1 TABLET BY MOUTH ONCE DAILY AFTER THE EVENING MEAL 08/19/15  Yes [place], Modena Nunnery, MD  sevelamer (RENVELA) 800 MG tablet Take 800-2,400 mg by mouth 5 (five) times daily. Take 3 tablets  three times daily with meals and take 1 tablet twice daily with snacks   Yes [provider]  traMADol (ULTRAM) 50 MG tablet Take 1 tablet (50 mg total) by mouth every 12 (twelve) hours as needed. 08/14/16  Yes [place], Ripudeep K, MD  VENTOLIN HFA 108 (90 Base) MCG/ACT inhaler INHALE (2) PUFFS EVERY 4 HOURS AS NEEDED FOR WHEEZING OR SHORTNESS OFBREATH(COUGH). 03/25/16  Yes , Modena Nunnery, MD  albuterol (PROVENTIL) (2.5 MG/3ML) 0.083% nebulizer solution USE 1 VIAL IN NEBULIZER 3 TIMES DAILY AS NEEDED FOR WHEEZING/SHORTNESS OF BREATH. 10/01/15   Alycia Rossetti, MD  Lancets MISC Use as directed to monitor FSBS 1x daily x2 weeks, then 1x daily PRN S/Sx hypoglycemia.  Dx: E11.9. 10/09/15   Alycia Rossetti, MD    Family History Family History  Problem Relation Age of Onset  . Stroke Father   . Hypertension Sister   . Heart failure Sister   . Cancer Brother     Social History Social History   Tobacco Use  . Smoking status: Never Smoker  . Smokeless tobacco: Never Used  Substance Use Topics  . Alcohol use: No    Alcohol/week: 0.0 oz  . Drug use: No     Allergies   Patient has no known allergies.   Review of Systems Review of Systems  Constitutional: Positive for fatigue. Negative for chills and fever.  Respiratory: Positive for shortness of breath. Negative for cough.   Cardiovascular: Negative for chest pain, palpitations and leg swelling.  Gastrointestinal: Negative for abdominal pain, blood in stool, constipation, diarrhea, nausea and vomiting.  Musculoskeletal: Negative for back pain, myalgias and neck pain.  Skin: Negative for rash and wound.  Neurological: Negative for dizziness, weakness, numbness and headaches.  All other systems reviewed and are negative.    Physical Exam Updated Vital Signs BP (!) 168/60 (BP Location: Right Arm)   Temp 98.5 F (36.9 C) (Oral)   Resp 18   Ht 5\' 6"  (1.676 m)   Wt 81.2 kg (179 lb)   LMP 12/24/2010   SpO2 100%   BMI 28.89 kg/m   Physical Exam  Constitutional: She is oriented to person, place, and time. She appears well-developed and well-nourished. No distress.  HENT:  Head: Normocephalic and atraumatic.  Mouth/Throat: Oropharynx is clear and moist.  Eyes: EOM are normal. Pupils are equal, round, and reactive to light.  Neck: Normal range of motion. Neck supple.  Cardiovascular: Normal rate and regular rhythm.  Pulmonary/Chest: Effort normal and breath sounds normal.  Abdominal: Soft. Bowel sounds are normal. There is no tenderness. There is no rebound and no guarding.  Musculoskeletal: Normal range of motion. She exhibits no edema or tenderness.  Dialysis shunt in left upper  extremity with palpable thrill.  Neurological: She is alert and oriented to person, place, and time.  Skin: Skin is warm and dry. Capillary refill takes less than 2 seconds. No rash noted. No erythema.  Psychiatric: She has a normal mood and affect. Her behavior is normal.  Nursing note and vitals  reviewed.    ED Treatments / Results  Labs (all labs ordered are listed, but only abnormal results are displayed) Labs Reviewed  CBC WITH DIFFERENTIAL/PLATELET - Abnormal; Notable for the following components:      Result Value   RBC 2.73 (*)    Hemoglobin 6.9 (*)    HCT 22.1 (*)    MCH 25.3 (*)    RDW 16.3 (*)    All other components within normal limits  COMPREHENSIVE METABOLIC PANEL - Abnormal; Notable for the following components:   Chloride 98 (*)    Glucose, Bld 123 (*)    BUN 37 (*)    Creatinine, Ser 6.22 (*)    Calcium 8.8 (*)    Total Protein 6.3 (*)    Albumin 3.3 (*)    Alkaline Phosphatase 130 (*)    GFR calc non Af Amer 5 (*)    GFR calc Af Amer 6 (*)    All other components within normal limits  PREPARE RBC (CROSSMATCH)  TYPE AND SCREEN    EKG  EKG Interpretation  Date/Time:  Thursday December 30 2016 17:00:25 EST Ventricular Rate:  67 PR Interval:    QRS Duration: 93 QT Interval:  444 QTC Calculation: 469 R Axis:   25 Text Interpretation:  Sinus rhythm Left ventricular hypertrophy Probable anterior infarct, age indeterminate Confirmed by Julianne Rice [phone]) on 12/30/2016 5:28:58 PM Also confirmed by Julianne Rice [phone])  on 12/30/2016 7:28:26 PM       Radiology Dg Chest Port 1 View  Result Date: 12/30/2016 CLINICAL DATA:  Chronic shortness of breath EXAM: PORTABLE CHEST 1 VIEW COMPARISON:  08/10/2016 FINDINGS: Elevation of the left diaphragm with basilar atelectasis. Cardiomegaly with vascular congestion and mild opacity suggesting pulmonary edema. No large pleural effusion. Negative for pneumothorax. IMPRESSION: 1. Cardiomegaly with vascular  congestion and mild interstitial edema 2. Elevation of the left diaphragm with atelectasis at the left lung base. Electronically Signed   By: Donavan Foil M.D.   On: 12/30/2016 17:41    Procedures Procedures (including critical care time)  Medications Ordered in ED Medications  0.9 %  sodium chloride infusion (not administered)     Initial Impression / Assessment and Plan / ED Course  I have reviewed the triage vital signs and the nursing notes.  Pertinent labs & imaging results that were available during my care of the patient were reviewed by me and considered in my medical decision making (see chart for details).     Hemoglobin is decreased from earlier today.  Will initiate transfusion.  Patient also has mild pulmonary edema on x-ray.  Discussed with hospitalist.  Will admit for transfusion and hemodialysis.  Patient is comfortable appearing.   Final Clinical Impressions(s) / ED Diagnoses   Final diagnoses:  Symptomatic anemia    ED Discharge Orders    None       Julianne Rice, MD 12/30/16 1930

## 2016-12-30 NOTE — Patient Instructions (Signed)
Am going to get iron studies.

## 2016-12-30 NOTE — H&P (Signed)
History and Physical    SMT LOKEY HKV:[id number] DOB: [birth year] DOA: 12/30/2016  PCP: Alycia Rossetti, MD Consultants:  Lowanda Foster - nephrology; Rehman - GI Patient coming from:  [place]; NOK: Daughter, [name]  Chief Complaint: abnormal lab  HPI: [name] is a 83 y.o. female with medical history significant of ESRD on MWF HD; hypothyroidism; CVA; HTN; HLD: DM; and CHF presenting due to an abnormal lab.  She was found to have a change in Hgb from 10 to 7.7 at dialysis and so they referred her to GI.  GI evaluated her in the office today and her Hgb was even lower so they sent her to the ER.   +SOB chronically, on O2 2-3L.  Worsening SOB with exertion recently.  No bleeding.  Not dizzy or light headed.  No fever.   ED Course: Incidental Hgb 7.8 2 days ago, Hgb 7.3 today, now 6.9.  Wears 2-3L O2 chronically.  Mild pulmonary edema on CXR.  With ?pulmonary edema, on HD - ?need to be hospitalized for transfusion and then HD in AM.  Review of Systems: As per HPI; otherwise review of systems reviewed and negative.   Ambulatory Status:   Ambulates with a walker  Past Medical History:  Diagnosis Date  . Anemia   . Anemia in CKD (chronic kidney disease)   . Anxiety   . Arthritis    gout, right shoulder  . Cancer [facility])    ?stomach cancer   . Cataracts, both eyes   . CHF (congestive heart failure) ([place])   . CKD (chronic kidney disease), stage V ([place])    on HD on M/W/F  . Depression   . Diabetes mellitus, type 2 ([place])   . GERD (gastroesophageal reflux disease)   . Gynecologic cancer   . Hyperlipidemia   . Hypertension   . IBS (irritable bowel syndrome)    with costipation  . Intraoperative hemorrhage and hematoma of a digestive system organ or structure complicating a digestive system procedure   . Normal cardiac stress test 05/2015  . Overactive bladder   . Pneumonia    2 times in 2016  . SOB (shortness of breath)    nml spirometry 09/05/06  .  Stroke [facility])    residual right side weakness and pain  . SVT (supraventricular tachycardia) ([place])   . Syncope and collapse   . Thyroid disease     Past Surgical History:  Procedure Laterality Date  . AV FISTULA PLACEMENT, BRACHIOCEPHALIC  56/43/3295   left arm   by Dr. Bridgett Larsson  . COLONOSCOPY    . FISTULA SUPERFICIALIZATION Left 06/10/2015   Procedure: LEFT UPPER ARM BRACHIOCEPHALIC FISTULA PSEUDOANEURYSM PLICATION;  Surgeon: Conrad Misquamicut, MD;  Location: [place];  Service: Vascular;  Laterality: Left;  . IR THROMBECTOMY AV FISTULA W/THROMBOLYSIS/PTA INC/SHUNT/IMG LEFT Left 05/06/2016  . IR US GUIDE VASC ACCESS LEFT  05/06/2016  . PERIPHERAL VASCULAR CATHETERIZATION Left 05/15/2015   Procedure: Fistulagram;  Surgeon: Conrad Bostonia, MD;  Location: [place] [facility];  Service: Cardiovascular;  Laterality: Left;  . TEE WITHOUT CARDIOVERSION  2011   2/2 Strep Bovis infection  . VESICOVAGINAL FISTULA CLOSURE W/ TAH      Social History   Socioeconomic History  . Marital status: Widowed    Spouse name: Not on file  . Number of children: Not on file  . Years of education: Not on file  . Highest education level: Not on file  Social Needs  .  Financial resource strain: Not on file  . Food insecurity - worry: Not on file  . Food insecurity - inability: Not on file  . Transportation needs - medical: Not on file  . Transportation needs - non-medical: Not on file  Occupational History  . Not on file  Tobacco Use  . Smoking status: Never Smoker  . Smokeless tobacco: Never Used  Substance and Sexual Activity  . Alcohol use: No    Alcohol/week: 0.0 oz  . Drug use: No  . Sexual activity: No  Other Topics Concern  . Not on file  Social History Narrative   The patient is one of 8 children and she does not know much of the specifics of her family medical history. She has 10 children and so many grandchildren and great grandchildren that she can't count. She has been on dialysis and living at an  assisted living center for the past 6 years.     No Known Allergies  Family History  Problem Relation Age of Onset  . Stroke Father   . Hypertension Sister   . Heart failure Sister   . Cancer Brother     Prior to Admission medications   Medication Sig Start Date End Date Taking? Authorizing Provider  ARTIFICIAL TEARS 1.4 % ophthalmic solution INSTILL 1 DROP INTO EITHER EYE UP TO 6 TIMES DAILY AS NEEDED FOR LUBRICATION 08/18/16  Yes [place], Modena Nunnery, MD  aspirin 81 MG chewable tablet Chew by mouth daily.   Yes [provider]  citalopram (CELEXA) 20 MG tablet TAKE (1) TABLET BY MOUTH ONCE IN THE MORNING. 03/24/16  Yes [place], Modena Nunnery, MD  diltiazem (CARDIZEM CD) 180 MG 24 hr capsule TAKE 1 CAPSULE BY MOUTH ONCE A DAY. 06/25/16  Yes [place], Modena Nunnery, MD  docusate sodium (COLACE) 100 MG capsule Take 100 mg by mouth 2 (two) times daily.   Yes [provider]  guaiFENesin-dextromethorphan (ROBITUSSIN DM) 100-10 MG/5ML syrup TAKE ONE TEASPOONFUL BY MOUTH EVERY 4 HOURS AS NEEDED FOR COUGH 02/17/16  Yes [place], Lonell Grandchild F, MD  ipratropium (ATROVENT) 0.02 % nebulizer solution USE 1 VIAL IN NEBULIZER 3 TIMES DAILY AS NEEDED FOR WHEEZING/SHORTNESS OF BREATH. 10/01/15  Yes [place], Modena Nunnery, MD  isosorbide mononitrate (IMDUR) 60 MG 24 hr tablet TAKE (1) TABLET BY MOUTH ONCE DAILY. 03/24/16  Yes [place], Modena Nunnery, MD  levothyroxine (SYNTHROID, LEVOTHROID) 25 MCG tablet TAKE (1) TABLET BY MOUTH ONCE DAILY BEFORE BREAKFAST. 03/24/16  Yes [place], Modena Nunnery, MD  lidocaine-prilocaine (EMLA) cream APPLY TOPICALLY TO LEFT UPPER ARM 1 HOUR PRIOR TO DIALYSIS ON MONDAY,WEDNESDAY & FRIDAYS AS NEEDED FOR PAIN. WRAP WITH PLASTIC WRAP AFTER A 03/23/16  Yes [place], Modena Nunnery, MD  mirtazapine (REMERON) 15 MG tablet TAKE 1/2 TABLET (7.5MG ) BY MOUTH AT BEDTIME FOR MOOD/APPETITE. 03/24/16  Yes [place], Modena Nunnery, MD  Multiple Vitamin (TAB-A-VITE) TABS Take 1 tablet by mouth daily.   Yes [provider]    Multiple Vitamins-Iron (MULTIVITAMINS WITH IRON) TABS tablet Take 1 tablet by mouth daily.   Yes [provider]  nitroGLYCERIN (NITROSTAT) 0.4 MG SL tablet DISSOLVE 1 TABLET UNDER THE TONGUE EVERY 5 MINUTES X2 DOSES THEN TO ER 08/18/16  Yes [place], Modena Nunnery, MD  nystatin (MYCOSTATIN/NYSTOP) powder Apply 4 (four) times daily as needed topically. To groin/ abd folds for irritation 11/18/16  Yes [place], Modena Nunnery, MD  OXYGEN Inhale 2 L into the lungs daily as needed (shortness of breath).   Yes [provider]  pantoprazole (PROTONIX) 40 MG tablet TAKE (1) TABLET BY MOUTH ONCE DAILY. 07/26/16  Yes [place], Modena Nunnery, MD  polyethylene glycol powder (MIRALAX) powder Take 17 g by mouth as needed for mild constipation. Hold for diarrhea 08/14/16  Yes Rai, Ripudeep K, MD  SENSIPAR 30 MG tablet TAKE 1 TABLET BY MOUTH ONCE DAILY AFTER THE EVENING MEAL 08/19/15  Yes [place], Modena Nunnery, MD  sevelamer (RENVELA) 800 MG tablet Take 800-2,400 mg by mouth 5 (five) times daily. Take 3 tablets  three times daily with meals and take 1 tablet twice daily with snacks   Yes [provider]  traMADol (ULTRAM) 50 MG tablet Take 1 tablet (50 mg total) by mouth every 12 (twelve) hours as needed. 08/14/16  Yes Rai, Ripudeep K, MD  VENTOLIN HFA 108 (90 Base) MCG/ACT inhaler INHALE (2) PUFFS EVERY 4 HOURS AS NEEDED FOR WHEEZING OR SHORTNESS OFBREATH(COUGH). 03/25/16  Yes [place], Modena Nunnery, MD  albuterol (PROVENTIL) (2.5 MG/3ML) 0.083% nebulizer solution USE 1 VIAL IN NEBULIZER 3 TIMES DAILY AS NEEDED FOR WHEEZING/SHORTNESS OF BREATH. 10/01/15   Alycia Rossetti, MD  Lancets MISC Use as directed to monitor FSBS 1x daily x2 weeks, then 1x daily PRN S/Sx hypoglycemia. Dx: E11.9. 10/09/15   Alycia Rossetti, MD    Physical Exam: Vitals:   12/30/16 1654 12/30/16 1655 12/30/16 1943 12/30/16 1943  BP:  (!) 168/60 (!) 150/57 (!) 150/57  Pulse:   62 63  Resp:  18 (!) 24 20  Temp:  98.5 F (36.9 C)  98.4 F (36.9 C)   TempSrc:  Oral    SpO2:  100% 100% 100%  Weight: 81.2 kg (179 lb)     Height: 5\' 6"  (1.676 m)        General:  Appears calm and comfortable and is NAD Eyes:  PERRL, EOMI, normal lids, iris ENT:  Hard of hearing, normal lips & tongue, mmm Neck:  no LAD, masses or thyromegaly Cardiovascular:  RRR, no m/r/g. No LE edema.  Respiratory:   CTA bilaterally with no wheezes/rales/rhonchi.  Normal respiratory effort. Abdomen:  soft, NT, ND, NABS Skin:  no rash or induration seen on limited exam Musculoskeletal:  grossly normal tone BUE/BLE, good ROM, no bony abnormality Lower extremity:  No LE edema.  Limited foot exam with no ulcerations.  2+ distal pulses. Psychiatric:  grossly normal mood and affect, speech fluent and appropriate, AOx3 Neurologic:  CN 2-12 grossly intact, moves all extremities in coordinated fashion, sensation intact    Radiological Exams on Admission: Dg Chest Port 1 View  Result Date: 12/30/2016 CLINICAL DATA:  Chronic shortness of breath EXAM: PORTABLE CHEST 1 VIEW COMPARISON:  08/10/2016 FINDINGS: Elevation of the left diaphragm with basilar atelectasis. Cardiomegaly with vascular congestion and mild opacity suggesting pulmonary edema. No large pleural effusion. Negative for pneumothorax. IMPRESSION: 1. Cardiomegaly with vascular congestion and mild interstitial edema 2. Elevation of the left diaphragm with atelectasis at the left lung base. Electronically Signed   By: Donavan Foil M.D.   On: 12/30/2016 17:41    EKG: Independently reviewed.  NSR with rate 67; LVH; nonspecific ST changes with no evidence of acute ischemia   Labs on Admission: I have personally reviewed the available labs and imaging studies at the time of the admission.  Pertinent labs:   Glucose 123 BUN 37/Creatinine 6.22/GFR 6 AP 130 Albumin 3.3 Hgb 6.9; 7.3 on 12/20 AM; 10.2 on 8/4   Assessment/Plan Principal Problem:   Symptomatic anemia Active Problems:  Diabetes mellitus with  renal complications (HCC)   Anemia in chronic kidney disease   Chronic respiratory failure (HCC)   ESRD (end stage renal disease) on dialysis [facility])   Hypothyroidism   Chronic diastolic heart failure (HCC)   Essential hypertension   Symptomatic anemia, likely due to ESRD -Patient with with known anemia associated with ESRD/CKD -Her anemia has worsened Hgb 6.9; 7.3 on 12/20 AM; 10.2 on 8/4 -MCV indicates that this is normocytic anemia, likely of chronic disease -Heme testing was negative in GI Clinic today -Will observe overnight in med surg bed. -Transfuse 1 unit PRBC to start and recheck Hgb afterwards.    ESRD -Patient on chronic MWF HD -Nephrology prn order set utilized -She does not appear to be volume overloaded or otherwise in need of acute HD -Because of possible pulmonary edema on CXR in conjunction with infusion of fluids (blood) in dialysis patient, it is reasonable to monitor her overnight in the hospital and dialyze tomorrow prior to d/c -Nephrology consulted for HD tomorrow  Chronic diastolic HF -2/02 Echo with preserved EF and grade 1 diastolic dysfunction -This is another reason to monitor patient in the hospital overnight after giving her volume via blood  HTN -She is not really taking medications for this issue (taking Cardizem and Imdur only, it appears) -Will follow  DM -A1c 5.3 in 8/18, will not recheck -She does not appear to be on medications for this issue at this time -Will follow with fasting labs without further intervention at this time  COPD -Continue 2-3L home O2 -Continue prn albuterol -Compensated at this time  Hypothyroidism -Check TSH -Continue Synthroid at current dose for now   DVT prophylaxis:  Lovenox Code Status: DNR - confirmed with patient Family Communication: None present Disposition Plan: Back to AL once clinically improved Consults called: Nephrology Admission status: It is my clinical opinion that referral for OBSERVATION  is reasonable and necessary in this patient based on the above information provided. The aforementioned taken together are felt to place the patient at high risk for further clinical deterioration. However it is anticipated that the patient may be medically stable for discharge from the hospital within 24 to 48 hours.    Karmen Bongo MD [facility]  If note is complete, please contact covering daytime or nighttime physician. [email or web address] Password [email or web address]  12/30/2016, 8:00 PM

## 2016-12-31 DIAGNOSIS — N186 End stage renal disease: Secondary | ICD-10-CM

## 2016-12-31 DIAGNOSIS — K922 Gastrointestinal hemorrhage, unspecified: Secondary | ICD-10-CM | POA: Diagnosis not present

## 2016-12-31 DIAGNOSIS — D631 Anemia in chronic kidney disease: Secondary | ICD-10-CM

## 2016-12-31 DIAGNOSIS — Z992 Dependence on renal dialysis: Secondary | ICD-10-CM | POA: Diagnosis not present

## 2016-12-31 DIAGNOSIS — J441 Chronic obstructive pulmonary disease with (acute) exacerbation: Secondary | ICD-10-CM | POA: Diagnosis not present

## 2016-12-31 DIAGNOSIS — D591 Other autoimmune hemolytic anemias: Secondary | ICD-10-CM | POA: Diagnosis not present

## 2016-12-31 DIAGNOSIS — F0390 Unspecified dementia without behavioral disturbance: Secondary | ICD-10-CM | POA: Diagnosis not present

## 2016-12-31 DIAGNOSIS — D649 Anemia, unspecified: Secondary | ICD-10-CM | POA: Diagnosis not present

## 2016-12-31 LAB — RENAL FUNCTION PANEL
ANION GAP: 12 (ref 5–15)
Albumin: 3.3 g/dL — ABNORMAL LOW (ref 3.5–5.0)
BUN: 47 mg/dL — ABNORMAL HIGH (ref 6–20)
CO2: 28 mmol/L (ref 22–32)
Calcium: 8.9 mg/dL (ref 8.9–10.3)
Chloride: 95 mmol/L — ABNORMAL LOW (ref 101–111)
Creatinine, Ser: 7.54 mg/dL — ABNORMAL HIGH (ref 0.44–1.00)
GFR calc non Af Amer: 4 mL/min — ABNORMAL LOW (ref >60)
GFR, EST AFRICAN AMERICAN: 5 mL/min — AB (ref >60)
GLUCOSE: 112 mg/dL — AB (ref 65–99)
POTASSIUM: 4.3 mmol/L (ref 3.5–5.1)
Phosphorus: 3.1 mg/dL (ref 2.5–4.6)
Sodium: 135 mmol/L (ref 135–145)

## 2016-12-31 LAB — TYPE AND SCREEN
ABO/RH(D): A POS
Antibody Screen: NEGATIVE
UNIT DIVISION: 0

## 2016-12-31 LAB — BASIC METABOLIC PANEL
ANION GAP: 11 (ref 5–15)
BUN: 42 mg/dL — ABNORMAL HIGH (ref 6–20)
CALCIUM: 8.8 mg/dL — AB (ref 8.9–10.3)
CO2: 29 mmol/L (ref 22–32)
Chloride: 97 mmol/L — ABNORMAL LOW (ref 101–111)
Creatinine, Ser: 6.92 mg/dL — ABNORMAL HIGH (ref 0.44–1.00)
GFR calc Af Amer: 5 mL/min — ABNORMAL LOW (ref >60)
GFR calc non Af Amer: 5 mL/min — ABNORMAL LOW (ref >60)
GLUCOSE: 98 mg/dL (ref 65–99)
Potassium: 4.1 mmol/L (ref 3.5–5.1)
Sodium: 137 mmol/L (ref 135–145)

## 2016-12-31 LAB — CBC
HEMATOCRIT: 26.9 % — AB (ref 36.0–46.0)
HEMATOCRIT: 29.1 % — AB (ref 36.0–46.0)
HEMOGLOBIN: 8.4 g/dL — AB (ref 12.0–15.0)
HEMOGLOBIN: 9.3 g/dL — AB (ref 12.0–15.0)
MCH: 24.9 pg — AB (ref 26.0–34.0)
MCH: 25.3 pg — AB (ref 26.0–34.0)
MCHC: 31.2 g/dL (ref 30.0–36.0)
MCHC: 32 g/dL (ref 30.0–36.0)
MCV: 79.6 fL (ref 78.0–100.0)
MCV: 79.1 fL (ref 78.0–100.0)
Platelets: 181 10*3/uL (ref 150–400)
Platelets: 181 10*3/uL (ref 150–400)
RBC: 3.38 MIL/uL — ABNORMAL LOW (ref 3.87–5.11)
RBC: 3.68 MIL/uL — AB (ref 3.87–5.11)
RDW: 15.9 % — ABNORMAL HIGH (ref 11.5–15.5)
RDW: 16.2 % — ABNORMAL HIGH (ref 11.5–15.5)
WBC: 6.3 10*3/uL (ref 4.0–10.5)
WBC: 8.2 10*3/uL (ref 4.0–10.5)

## 2016-12-31 LAB — BPAM RBC
BLOOD PRODUCT EXPIRATION DATE: 201901052359
ISSUE DATE / TIME: 201812201935
Unit Type and Rh: 6200

## 2016-12-31 LAB — MRSA PCR SCREENING: MRSA BY PCR: NEGATIVE

## 2016-12-31 MED ORDER — ALTEPLASE 2 MG IJ SOLR
2.0000 mg | Freq: Once | INTRAMUSCULAR | Status: DC | PRN
  Filled 2016-12-31: qty 2

## 2016-12-31 MED ORDER — HEPARIN SODIUM (PORCINE) 1000 UNIT/ML DIALYSIS
1000.0000 [IU] | INTRAMUSCULAR | Status: DC | PRN
  Filled 2016-12-31: qty 1

## 2016-12-31 MED ORDER — PENTAFLUOROPROP-TETRAFLUOROETH EX AERO: 1.0000 "application " | INHALATION_SPRAY | CUTANEOUS | Status: DC | PRN

## 2016-12-31 MED ORDER — NEPRO/CARBSTEADY PO LIQD: 237.0000 mL | Freq: Three times a day (TID) | ORAL | 0 refills | Status: DC | PRN

## 2016-12-31 MED ORDER — LIDOCAINE HCL (PF) 1 % IJ SOLN: 5.0000 mL | INTRAMUSCULAR | Status: DC | PRN

## 2016-12-31 MED ORDER — SODIUM CHLORIDE 0.9 % IV SOLN: 100.0000 mL | INTRAVENOUS | Status: DC | PRN

## 2016-12-31 MED ORDER — ALUM & MAG HYDROXIDE-SIMETH 200-200-20 MG/5ML PO SUSP
15.0000 mL | Freq: Once | ORAL | Status: AC
  Administered 2016-12-31: 15 mL via ORAL
  Filled 2016-12-31: qty 30

## 2016-12-31 MED ORDER — LIDOCAINE-PRILOCAINE 2.5-2.5 % EX CREA: 1.0000 "application " | TOPICAL_CREAM | CUTANEOUS | Status: DC | PRN

## 2016-12-31 NOTE — Clinical Social Work Note (Signed)
Clinical Social Work Assessment  Patient Details  Name: [name] MRN: [id number] Date of Birth: 1925/05/07  Date of referral:  12/31/16               Reason for consult:  Discharge Planning                Permission sought to share information with:  Chartered certified accountant granted to share information::  Yes, Verbal Permission Granted  Name::        Agency::  [facility]  Relationship::     Contact Information:     Housing/Transportation Living arrangements for the past 2 months:  [place] of Information:  Patient, Facility Patient Interpreter Needed:  None Criminal Activity/Legal Involvement Pertinent to Current Situation/Hospitalization:  No - Comment as needed Significant Relationships:  Adult Children Lives with:  Facility Resident Do you feel safe going back to the place where you live?  Yes Need for family participation in patient care:  Yes (Comment)  Care giving concerns: Pt resides at [facility].   Social Worker assessment / plan: Pt is an 83 year old female here in observation status. Reviewed pt's record and met with pt this AM. Pt is Alert and Oriented x4 at the time of SW visit. Pt resides at [place] and plan is for return to the facility at dc. [name] will transport. Pt uses a walker for ambulation. She has O2 at baseline. Pt needs minimal assistance with bathing and she is fairly independent otherwise. Pt receives outpt HD at [facility] in [place] on a MWF schedule. Spoke with RN at [facility] and she indicated that if pt remains OBS then she won't need a new FL2 at dc but if she changes to inpt, then she will need a new FL2 at dc. FL2 information entered in the event it is needed. RN at [facility] will fax over a current med list for pt. She asks that dc information be faxed to them at [phone].  Please contact weekend CSW if pt is released over the weekend.    Employment status:  Retired Designer, industrial/product PT Recommendations:  Not assessed at this time Information / Referral to community resources:     Patient/Family's Response to care: Pt is accepting of care.  Patient/Family's Understanding of and Emotional Response to Diagnosis, Current Treatment, and Prognosis: Pt appears to understand current diagnosis and treatment plan. She appears accepting.  Emotional Assessment Appearance:  Appears stated age Attitude/Demeanor/Rapport:    Affect (typically observed):  Calm, Pleasant Orientation:  Oriented to Self, Oriented to Place, Oriented to  Time, Oriented to Situation Alcohol / Substance use:  Not Applicable Psych involvement (Current and /or in the community):  No (Comment)  Discharge Needs  Concerns to be addressed:  Discharge Planning Concerns Readmission within the last 30 days:  No Current discharge risk:  None Barriers to Discharge:  No Barriers Identified   Shade Flood, LCSW 12/31/2016, 10:06 AM

## 2016-12-31 NOTE — Care Management Note (Addendum)
Case Management Note  Patient Details  Name: [name] MRN: [id number] Date of Birth: 1925/02/08  Subjective/Objective:     Patient from [facility]. Here with symptomatic anemia. ESRD on HD MWF. Will have HD today. Chronically on 2-3L pta. Plans to return to [place] at time of DC.              Action/Plan: DC back to [place]. ? Today after HD. Nephrology suggest possible GI workup. CSW making arrangements for return to [place] when appropriate.  Expected Discharge Date:       12/31/2016           Expected Discharge Plan:  Assisted Living / Rest Home  In-House Referral:  Clinical Social Work  Discharge planning Services  CM Consult  Post Acute Care Choice:    Choice offered to:     DME Arranged:    DME Agency:     HH Arranged:    HH Agency:     Status of Service:  In process, will continue to follow  If discussed at Long Length of Stay Meetings, dates discussed:    Additional Comments:  Anvika Gashi, Chauncey [place], RN 12/31/2016, 10:31 AM

## 2016-12-31 NOTE — Care Management Obs Status (Signed)
[place] NOTIFICATION   Patient Details  Name: RAIYAH SPEAKMAN MRN: [id number] Date of Birth: 09-12-1925   Medicare Observation Status Notification Given:  Yes    Oney Folz, Chauncey Reading, RN 12/31/2016, 10:29 AM

## 2016-12-31 NOTE — Consult Note (Signed)
Reason for Consult: End-stage renal disease Referring Physician: Dr. [name] is an 83 y.o. female.  HPI: She is a patient who has history of diabetes, end-stage renal disease on maintenance hemodialysis presently was sent because of declining hemoglobin.  When she was seen in the emergency room her hemoglobin has declined further and is admitted for blood transfusion.  Presently patient denies any nausea or vomiting.  Last dialysis was on Wednesday and she is due for dialysis.  Patient at this moment offers no complaints.  Past Medical History:  Diagnosis Date  . Anemia   . Anemia in CKD (chronic kidney disease)   . Anxiety   . Arthritis    gout, right shoulder  . Cancer [facility])    ?stomach cancer   . Cataracts, both eyes   . CHF (congestive heart failure) ([place])   . CKD (chronic kidney disease), stage V ([place])    on HD on M/W/F  . Depression   . Diabetes mellitus, type 2 ([place])   . GERD (gastroesophageal reflux disease)   . Gynecologic cancer   . Hyperlipidemia   . Hypertension   . IBS (irritable bowel syndrome)    with costipation  . Intraoperative hemorrhage and hematoma of a digestive system organ or structure complicating a digestive system procedure   . Normal cardiac stress test 05/2015  . Overactive bladder   . Pneumonia    2 times in 2016  . SOB (shortness of breath)    nml spirometry 09/05/06  . Stroke [facility])    residual right side weakness and pain  . SVT (supraventricular tachycardia) ([place])   . Syncope and collapse   . Thyroid disease     Past Surgical History:  Procedure Laterality Date  . AV FISTULA PLACEMENT, BRACHIOCEPHALIC  45/85/9292   left arm   by Dr. Bridgett Larsson  . COLONOSCOPY    . FISTULA SUPERFICIALIZATION Left 06/10/2015   Procedure: LEFT UPPER ARM BRACHIOCEPHALIC FISTULA PSEUDOANEURYSM PLICATION;  Surgeon: Conrad Albers, MD;  Location: [facility];  Service: Vascular;  Laterality: Left;  . IR THROMBECTOMY AV FISTULA W/THROMBOLYSIS/PTA INC/SHUNT/IMG  LEFT Left 05/06/2016  . IR US GUIDE VASC ACCESS LEFT  05/06/2016  . PERIPHERAL VASCULAR CATHETERIZATION Left 05/15/2015   Procedure: Fistulagram;  Surgeon: Conrad Divide, MD;  Location: [place] [facility];  Service: Cardiovascular;  Laterality: Left;  . TEE WITHOUT CARDIOVERSION  2011   2/2 Strep Bovis infection  . VESICOVAGINAL FISTULA CLOSURE W/ TAH      Family History  Problem Relation Age of Onset  . Stroke Father   . Hypertension Sister   . Heart failure Sister   . Cancer Brother     Social History:  reports that  has never smoked. she has never used smokeless tobacco. She reports that she does not drink alcohol or use drugs.  Allergies: No Known Allergies  Medications: I have reviewed the patient's current medications.  Results for orders placed or performed during the hospital encounter of 12/30/16 (from the past 48 hour(s))  CBC with Differential/Platelet     Status: Abnormal   Collection Time: 12/30/16  5:18 PM  Result Value Ref Range   WBC 6.1 4.0 - 10.5 K/uL   RBC 2.73 (L) 3.87 - 5.11 MIL/uL   Hemoglobin 6.9 (LL) 12.0 - 15.0 g/dL    Comment: REPEATED TO VERIFY CRITICAL RESULT CALLED TO, READ BACK BY AND VERIFIED WITH: CREWS,A @ 1745 ON 12.20.18 BY BOWMAN,L    HCT 22.1 (L)  36.0 - 46.0 %   MCV 81.0 78.0 - 100.0 fL   MCH 25.3 (L) 26.0 - 34.0 pg   MCHC 31.2 30.0 - 36.0 g/dL   RDW 16.3 (H) 11.5 - 15.5 %   Platelets 163 150 - 400 K/uL   Neutrophils Relative % 64 %   Neutro Abs 3.9 1.7 - 7.7 K/uL   Lymphocytes Relative 26 %   Lymphs Abs 1.6 0.7 - 4.0 K/uL   Monocytes Relative 9 %   Monocytes Absolute 0.5 0.1 - 1.0 K/uL   Eosinophils Relative 1 %   Eosinophils Absolute 0.0 0.0 - 0.7 K/uL   Basophils Relative 0 %   Basophils Absolute 0.0 0.0 - 0.1 K/uL  Comprehensive metabolic panel     Status: Abnormal   Collection Time: 12/30/16  5:18 PM  Result Value Ref Range   Sodium 139 135 - 145 mmol/L   Potassium 3.6 3.5 - 5.1 mmol/L   Chloride 98 (L) 101 - 111 mmol/L    CO2 30 22 - 32 mmol/L   Glucose, Bld 123 (H) 65 - 99 mg/dL   BUN 37 (H) 6 - 20 mg/dL   Creatinine, Ser 6.22 (H) 0.44 - 1.00 mg/dL   Calcium 8.8 (L) 8.9 - 10.3 mg/dL   Total Protein 6.3 (L) 6.5 - 8.1 g/dL   Albumin 3.3 (L) 3.5 - 5.0 g/dL   AST 21 15 - 41 U/L   ALT 17 14 - 54 U/L   Alkaline Phosphatase 130 (H) 38 - 126 U/L   Total Bilirubin 0.4 0.3 - 1.2 mg/dL   GFR calc non Af Amer 5 (L) >60 mL/min   GFR calc Af Amer 6 (L) >60 mL/min    Comment: (NOTE) The eGFR has been calculated using the CKD EPI equation. This calculation has not been validated in all clinical situations. eGFR's persistently <60 mL/min signify possible Chronic Kidney Disease.    Anion gap 11 5 - 15  Prepare RBC     Status: None   Collection Time: 12/30/16  5:24 PM  Result Value Ref Range   Order Confirmation ORDER PROCESSED BY BLOOD BANK   Type and screen [facility]     Status: None   Collection Time: 12/30/16  5:26 PM  Result Value Ref Range   ABO/RH(D) A POS    Antibody Screen NEG    Sample Expiration 01/02/2017    Unit Number [id number]    Blood Component Type RED CELLS,LR    Unit division 00    Status of Unit ISSUED,FINAL    Transfusion Status OK TO TRANSFUSE    Crossmatch Result Compatible   TSH     Status: None   Collection Time: 12/30/16  5:30 PM  Result Value Ref Range   TSH 1.332 0.350 - 4.500 uIU/mL    Comment: Performed by a 3rd Generation assay with a functional sensitivity of <=0.01 uIU/mL.  MRSA PCR Screening     Status: None   Collection Time: 12/31/16 12:40 AM  Result Value Ref Range   MRSA by PCR NEGATIVE NEGATIVE    Comment:        The GeneXpert MRSA Assay (FDA approved for NASAL specimens only), is one component of a comprehensive MRSA colonization surveillance program. It is not intended to diagnose MRSA infection nor to guide or monitor treatment for MRSA infections.   Basic metabolic panel     Status: Abnormal   Collection Time: 12/31/16  4:46 AM  Result  Value Ref Range  Sodium 137 135 - 145 mmol/L   Potassium 4.1 3.5 - 5.1 mmol/L   Chloride 97 (L) 101 - 111 mmol/L   CO2 29 22 - 32 mmol/L   Glucose, Bld 98 65 - 99 mg/dL   BUN 42 (H) 6 - 20 mg/dL   Creatinine, Ser 6.92 (H) 0.44 - 1.00 mg/dL   Calcium 8.8 (L) 8.9 - 10.3 mg/dL   GFR calc non Af Amer 5 (L) >60 mL/min   GFR calc Af Amer 5 (L) >60 mL/min    Comment: (NOTE) The eGFR has been calculated using the CKD EPI equation. This calculation has not been validated in all clinical situations. eGFR's persistently <60 mL/min signify possible Chronic Kidney Disease.    Anion gap 11 5 - 15  CBC     Status: Abnormal   Collection Time: 12/31/16  4:46 AM  Result Value Ref Range   WBC 6.3 4.0 - 10.5 K/uL   RBC 3.38 (L) 3.87 - 5.11 MIL/uL   Hemoglobin 8.4 (L) 12.0 - 15.0 g/dL   HCT 26.9 (L) 36.0 - 46.0 %   MCV 79.6 78.0 - 100.0 fL   MCH 24.9 (L) 26.0 - 34.0 pg   MCHC 31.2 30.0 - 36.0 g/dL   RDW 15.9 (H) 11.5 - 15.5 %   Platelets 181 150 - 400 K/uL    Dg Chest Port 1 View  Result Date: 12/30/2016 CLINICAL DATA:  Chronic shortness of breath EXAM: PORTABLE CHEST 1 VIEW COMPARISON:  08/10/2016 FINDINGS: Elevation of the left diaphragm with basilar atelectasis. Cardiomegaly with vascular congestion and mild opacity suggesting pulmonary edema. No large pleural effusion. Negative for pneumothorax. IMPRESSION: 1. Cardiomegaly with vascular congestion and mild interstitial edema 2. Elevation of the left diaphragm with atelectasis at the left lung base. Electronically Signed   By: Donavan Foil M.D.   On: 12/30/2016 17:41    Review of Systems  Constitutional: Negative for fever.  Respiratory: Negative for shortness of breath.   Cardiovascular: Negative for orthopnea and leg swelling.  Gastrointestinal: Positive for heartburn. Negative for nausea and vomiting.  Neurological: Negative for weakness.   Blood pressure (!) 150/65, pulse 70, temperature 98.2 F (36.8 C), temperature source Oral,  resp. rate 18, height '5\' 4"'  (1.626 m), weight 81.4 kg (179 lb 6.4 oz), last menstrual period 12/24/2010, SpO2 99 %. Physical Exam  Constitutional: She is oriented to person, place, and time. No distress.  HENT:  Decreased hearing bilaterally  Eyes: Left eye exhibits no discharge.  Neck: No JVD present.  Cardiovascular: Normal rate and regular rhythm.  No murmur heard. Respiratory: No respiratory distress. She has no wheezes.  GI: She exhibits no distension. There is no tenderness. There is no rebound.  Musculoskeletal: She exhibits no edema.  Neurological: She is alert and oriented to person, place, and time.    Assessment/Plan: 1] anemia: Her hemoglobin has declined significantly.  She denies any bleeding.  She states that the only thing she knows she has hiatal hernia otherwise no nausea, no vomiting and no change in the color of her stool.  She has a rectal exam which was negative according to the patient.  Hence not sure what the etiology for her significant anemia.  Patient has received blood transfusion and her hemoglobin has improved. 2] end-stage renal disease: She is status post hemodialysis on Wednesday.  She does not have any uremic signs and symptoms.  Her potassium is normal 3] history of hypertension: Her blood pressure is reasonably controlled 4] diabetes  5] bone and mineral disorder: Her calcium is a range 6] history of hypothyroidism Plan: 1] we will make arrangements for patient to get dialysis today 2]e will hold heparin during dialysis 3]We will remove 2 and half liters if systolic blood pressure remains above 90 4] because of significant drop in her hemoglobin patient may require further workup by GI. 5] will check her CBC and renal panel in the morning.    [name] S 12/31/2016, 8:39 AM

## 2016-12-31 NOTE — Progress Notes (Signed)
Dialysis nurse stated she 3 liters drawn off.  Patient now eating supper.  Discharge papers signed and daugher to pick up soon

## 2016-12-31 NOTE — Clinical Social Work Note (Signed)
LCSW notified facility and sent discharge clinicals related to patient's discharge.   LCSW contacted patient's daughter, Vidal Schwalbe, and advised of patient's discharge. She indicated that she would transport patient to the facility.  LCSW signing off.

## 2016-12-31 NOTE — Discharge Summary (Addendum)
Physician Discharge Summary  [name]:[id number] DOB: 12-19-1925 DOA: 12/30/2016  PCP: Alycia Rossetti, MD  Admit date: 12/30/2016 Discharge date: 12/31/2016  Admitted From: Assisted living [name]) Disposition:  Assisted living  Recommendations for Outpatient Follow-up:  1. Follow up with PCP in 1-2 weeks. Monitor hemoglobin as outpatient 2. Follow-up with scheduled hemodialysis and outpatient gastroenterology.  Home Health:None Equipment/Devices:none  Discharge Condition:Fair CODE STATUS:DNR Diet recommendation: Carb  Modified/ heart healthy    Discharge Diagnoses:  Principal Problem:   Anemia in chronic kidney disease   Active Problems:   Diabetes mellitus with renal complications (HCC)   Chronic respiratory failure (HCC)   ESRD (end stage renal disease) on dialysis (HCC)   Hypothyroidism   Chronic diastolic heart failure (HCC)   Essential hypertension   Symptomatic anemia  Brief narrative/history of present illness Please refer to admission H&P for details, in brief, 83 year old female with ESRD on hemodialysis (M, W, F), hypothyroidism, history of CVA, hypertension, diabetes mellitus,And history of diastolic CHF presented with drop in hemoglobin of 7.7 from baseline around 10. She was Referred to GI and was seen in the office where her hemoglobin was further lower at 6.9. Stool for Hemoccult was negative. Patient reports chronic shortness of breath with occasional use of oxygen. Reports having increasing shortness of breath with exertion lately. Denied any melena, bright red blood per rectum, hematemesis or hemoptysis. Denied any dizziness or lightheadedness, chest pain or palpitations. Patient placed on observation and ordered 1 unit PRBC.  Hospital course Symptomatic anemia Drop in H&H likely associated with anemia of chronic kidney disease/ ESRD. Received 1 unit PRBC and hemoglobin improved to 8.4 this morning. Stool for Hemoccult was negative in the  GI clinic yesterday. No further overnight issues and shortness of breath better this morning. Discussed with GI who plans to follow her as outpatient.  ESRD On dialysis (M, W, F) Renal consult appreciated. Continue home medications. Getting dialyzed today and can be discharged Back to assisted living after that.  Chronic diastolic CHF  No signs of volume overload. Volume management with dialysis  Essential hypertension/ Resume home medications .   Remaining medical issues are stable. Stable to be discharged back to assisted living after dialysis.  Consults: Nephrology, discussed with GI ( Dr Laural Golden)  Procedure: None  Family communication: None   Discharge Instructions   Allergies as of 12/31/2016   No Known Allergies     Medication List    STOP taking these medications   traMADol 50 MG tablet Commonly known as:  ULTRAM     TAKE these medications   albuterol (2.5 MG/3ML) 0.083% nebulizer solution Commonly known as:  PROVENTIL USE 1 VIAL IN NEBULIZER 3 TIMES DAILY AS NEEDED FOR WHEEZING/SHORTNESS OF BREATH.   VENTOLIN HFA 108 (90 Base) MCG/ACT inhaler Generic drug:  albuterol INHALE (2) PUFFS EVERY 4 HOURS AS NEEDED FOR WHEEZING OR SHORTNESS OFBREATH(COUGH).   ARTIFICIAL TEARS 1.4 % ophthalmic solution Generic drug:  polyvinyl alcohol INSTILL 1 DROP INTO EITHER EYE UP TO 6 TIMES DAILY AS NEEDED FOR LUBRICATION   aspirin 81 MG chewable tablet Chew by mouth daily.   citalopram 20 MG tablet Commonly known as:  CELEXA TAKE (1) TABLET BY MOUTH ONCE IN THE MORNING.   diltiazem 180 MG 24 hr capsule Commonly known as:  CARDIZEM CD TAKE 1 CAPSULE BY MOUTH ONCE A DAY.   docusate sodium 100 MG capsule Commonly known as:  COLACE Take 100 mg by mouth 2 (two) times daily.  feeding supplement (NEPRO CARB STEADY) Liqd Take 237 mLs by mouth 3 (three) times daily as needed (Supplement).   guaiFENesin-dextromethorphan 100-10 MG/5ML syrup Commonly known as:  ROBITUSSIN  DM TAKE ONE TEASPOONFUL BY MOUTH EVERY 4 HOURS AS NEEDED FOR COUGH   ipratropium 0.02 % nebulizer solution Commonly known as:  ATROVENT USE 1 VIAL IN NEBULIZER 3 TIMES DAILY AS NEEDED FOR WHEEZING/SHORTNESS OF BREATH.   isosorbide mononitrate 60 MG 24 hr tablet Commonly known as:  IMDUR TAKE (1) TABLET BY MOUTH ONCE DAILY.   Lancets Misc Use as directed to monitor FSBS 1x daily x2 weeks, then 1x daily PRN S/Sx hypoglycemia. Dx: E11.9.   levothyroxine 25 MCG tablet Commonly known as:  SYNTHROID, LEVOTHROID TAKE (1) TABLET BY MOUTH ONCE DAILY BEFORE BREAKFAST.   lidocaine-prilocaine cream Commonly known as:  EMLA APPLY TOPICALLY TO LEFT UPPER ARM 1 HOUR PRIOR TO DIALYSIS ON MONDAY,WEDNESDAY & FRIDAYS AS NEEDED FOR PAIN. WRAP WITH PLASTIC WRAP AFTER A   mirtazapine 15 MG tablet Commonly known as:  REMERON TAKE 1/2 TABLET (7.5MG ) BY MOUTH AT BEDTIME FOR MOOD/APPETITE.   multivitamins with iron Tabs tablet Take 1 tablet by mouth daily.   nitroGLYCERIN 0.4 MG SL tablet Commonly known as:  NITROSTAT DISSOLVE 1 TABLET UNDER THE TONGUE EVERY 5 MINUTES X2 DOSES THEN TO ER   nystatin powder Commonly known as:  MYCOSTATIN/NYSTOP Apply 4 (four) times daily as needed topically. To groin/ abd folds for irritation   OXYGEN Inhale 2 L into the lungs daily as needed (shortness of breath).   pantoprazole 40 MG tablet Commonly known as:  PROTONIX TAKE (1) TABLET BY MOUTH ONCE DAILY.   polyethylene glycol powder powder Commonly known as:  MIRALAX Take 17 g by mouth as needed for mild constipation. Hold for diarrhea   SENSIPAR 30 MG tablet Generic drug:  cinacalcet TAKE 1 TABLET BY MOUTH ONCE DAILY AFTER THE EVENING MEAL   sevelamer carbonate 800 MG tablet Commonly known as:  RENVELA Take 800-2,400 mg by mouth 5 (five) times daily. Take 3 tablets  three times daily with meals and take 1 tablet twice daily with snacks   TAB-A-VITE Tabs Take 1 tablet by mouth daily.       Follow-up Information    [place], Modena Nunnery, MD. Schedule an appointment as soon as possible for a visit in 1 week(s).   Specialty:  Family Medicine Contact information: [address] [place]  [address] [phone]        Rogene Houston, MD Follow up in 3 week(s).   Specialty:  Gastroenterology Why:  office will call Contact information: [address], [address] [place] [address] [phone]          No Known Allergies    Procedures/Studies: Dg Chest Port 1 View  Result Date: 12/30/2016 CLINICAL DATA:  Chronic shortness of breath EXAM: PORTABLE CHEST 1 VIEW COMPARISON:  08/10/2016 FINDINGS: Elevation of the left diaphragm with basilar atelectasis. Cardiomegaly with vascular congestion and mild opacity suggesting pulmonary edema. No large pleural effusion. Negative for pneumothorax. IMPRESSION: 1. Cardiomegaly with vascular congestion and mild interstitial edema 2. Elevation of the left diaphragm with atelectasis at the left lung base. Electronically Signed   By: Donavan Foil M.D.   On: 12/30/2016 17:41       Subjective: feels better after transfusion. Complains of some heartburn  Discharge Exam: Vitals:   12/31/16 1415 12/31/16 1445  BP: (!) 185/100 (!) 191/103  Pulse: 68 73  Resp:  20 (!) 22  Temp:    SpO2:     Vitals:   12/31/16 1335 12/31/16 1345 12/31/16 1415 12/31/16 1445  BP: (!) 192/98 (!) 136/99 (!) 185/100 (!) 191/103  Pulse: 75 61 68 73  Resp: (!) 22 20 20  (!) 22  Temp: (!) 97.4 F (36.3 C)     TempSrc: Oral     SpO2: 96%     Weight: 81.3 kg (179 lb 3.7 oz)     Height:        General: Elderly female not in distress HEENT: Pallor present, moist because of, supple neck Chest: Clear bilaterally, no added sounds CVS: Normal S1 and S2, no murmurs rub or gallop GI: Soft, nondistended, nontender Musculoskeletal: Warm, no edema    The results of significant diagnostics from this hospitalization (including imaging,  microbiology, ancillary and laboratory) are listed below for reference.     Microbiology: Recent Results (from the past 240 hour(s))  MRSA PCR Screening     Status: None   Collection Time: 12/31/16 12:40 AM  Result Value Ref Range Status   MRSA by PCR NEGATIVE NEGATIVE Final    Comment:        The GeneXpert MRSA Assay (FDA approved for NASAL specimens only), is one component of a comprehensive MRSA colonization surveillance program. It is not intended to diagnose MRSA infection nor to guide or monitor treatment for MRSA infections.      Labs: BNP (last 3 results) Recent Labs    04/22/16 1225 08/10/16 1751  BNP 1,004.0* 6,283.1*   Basic Metabolic Panel: Recent Labs  Lab 12/30/16 1718 12/31/16 0446  NA 139 137  K 3.6 4.1  CL 98* 97*  CO2 30 29  GLUCOSE 123* 98  BUN 37* 42*  CREATININE 6.22* 6.92*  CALCIUM 8.8* 8.8*   Liver Function Tests: Recent Labs  Lab 12/30/16 1718  AST 21  ALT 17  ALKPHOS 130*  BILITOT 0.4  PROT 6.3*  ALBUMIN 3.3*   No results for input(s): LIPASE, AMYLASE in the last 168 hours. No results for input(s): AMMONIA in the last 168 hours. CBC: Recent Labs  Lab 12/30/16 1041 12/30/16 1718 12/31/16 0446 12/31/16 1500  WBC 6.8 6.1 6.3 8.2  NEUTROABS 4,869 3.9  --   --   HGB 7.3* 6.9* 8.4* 9.3*  HCT 23.4* 22.1* 26.9* 29.1*  MCV 80.1 81.0 79.6 79.1  PLT 198 163 181 181   Cardiac Enzymes: No results for input(s): CKTOTAL, CKMB, CKMBINDEX, TROPONINI in the last 168 hours. BNP: Invalid input(s): POCBNP CBG: No results for input(s): GLUCAP in the last 168 hours. D-Dimer No results for input(s): DDIMER in the last 72 hours. Hgb A1c No results for input(s): HGBA1C in the last 72 hours. Lipid Profile No results for input(s): CHOL, HDL, LDLCALC, TRIG, CHOLHDL, LDLDIRECT in the last 72 hours. Thyroid function studies Recent Labs    12/30/16 1730  TSH 1.332   Anemia work up Recent Labs    12/30/16 1041  FERRITIN 657*  TIBC  205*  IRON 72   Urinalysis    Component Value Date/Time   COLORURINE YELLOW 01/31/2011 2039   APPEARANCEUR CLEAR 01/31/2011 2039   LABSPEC 1.010 01/31/2011 2039   PHURINE 6.5 01/31/2011 2039   GLUCOSEU NEGATIVE 01/31/2011 2039   HGBUR NEGATIVE 01/31/2011 2039   HGBUR trace-lysed 09/05/2006 0000   BILIRUBINUR NEGATIVE 01/31/2011 2039   [place] NEGATIVE 01/31/2011 2039   PROTEINUR 30 (A) 01/31/2011 2039   UROBILINOGEN 0.2 01/31/2011 2039  NITRITE NEGATIVE 01/31/2011 2039   LEUKOCYTESUR NEGATIVE 01/31/2011 2039   Sepsis Labs Invalid input(s): PROCALCITONIN,  WBC,  LACTICIDVEN Microbiology Recent Results (from the past 240 hour(s))  MRSA PCR Screening     Status: None   Collection Time: 12/31/16 12:40 AM  Result Value Ref Range Status   MRSA by PCR NEGATIVE NEGATIVE Final    Comment:        The GeneXpert MRSA Assay (FDA approved for NASAL specimens only), is one component of a comprehensive MRSA colonization surveillance program. It is not intended to diagnose MRSA infection nor to guide or monitor treatment for MRSA infections.      Time coordinating discharge: < 30 minutes  SIGNED:   Louellen Molder, MD  [facility] 12/31/2016, 3:33 PM Pager   If 7PM-7AM, please contact night-coverage [email or web address] Password [email or web address]

## 2016-12-31 NOTE — Progress Notes (Signed)
In the process now of dialysis.  To be discharged back to [facility] in [place].  Daughter to pick up and drive her

## 2017-01-01 DIAGNOSIS — F0390 Unspecified dementia without behavioral disturbance: Secondary | ICD-10-CM | POA: Diagnosis not present

## 2017-01-01 LAB — HEPATITIS B SURFACE ANTIGEN: HEP B S AG: NEGATIVE

## 2017-01-01 LAB — HEPATITIS B SURFACE ANTIBODY,QUALITATIVE: HEP B S AB: REACTIVE

## 2017-01-02 DIAGNOSIS — Z992 Dependence on renal dialysis: Secondary | ICD-10-CM | POA: Diagnosis not present

## 2017-01-02 DIAGNOSIS — F0390 Unspecified dementia without behavioral disturbance: Secondary | ICD-10-CM | POA: Diagnosis not present

## 2017-01-02 DIAGNOSIS — N186 End stage renal disease: Secondary | ICD-10-CM | POA: Diagnosis not present

## 2017-01-03 DIAGNOSIS — F0390 Unspecified dementia without behavioral disturbance: Secondary | ICD-10-CM | POA: Diagnosis not present

## 2017-01-04 DIAGNOSIS — F0390 Unspecified dementia without behavioral disturbance: Secondary | ICD-10-CM | POA: Diagnosis not present

## 2017-01-05 DIAGNOSIS — N186 End stage renal disease: Secondary | ICD-10-CM | POA: Diagnosis not present

## 2017-01-05 DIAGNOSIS — Z992 Dependence on renal dialysis: Secondary | ICD-10-CM | POA: Diagnosis not present

## 2017-01-05 DIAGNOSIS — F0390 Unspecified dementia without behavioral disturbance: Secondary | ICD-10-CM | POA: Diagnosis not present

## 2017-01-06 DIAGNOSIS — F0390 Unspecified dementia without behavioral disturbance: Secondary | ICD-10-CM | POA: Diagnosis not present

## 2017-01-07 DIAGNOSIS — N186 End stage renal disease: Secondary | ICD-10-CM | POA: Diagnosis not present

## 2017-01-07 DIAGNOSIS — F0390 Unspecified dementia without behavioral disturbance: Secondary | ICD-10-CM | POA: Diagnosis not present

## 2017-01-07 DIAGNOSIS — Z992 Dependence on renal dialysis: Secondary | ICD-10-CM | POA: Diagnosis not present

## 2017-01-08 DIAGNOSIS — F0390 Unspecified dementia without behavioral disturbance: Secondary | ICD-10-CM | POA: Diagnosis not present

## 2017-01-09 DIAGNOSIS — F0390 Unspecified dementia without behavioral disturbance: Secondary | ICD-10-CM | POA: Diagnosis not present

## 2017-01-10 DIAGNOSIS — Z992 Dependence on renal dialysis: Secondary | ICD-10-CM | POA: Diagnosis not present

## 2017-01-10 DIAGNOSIS — F0390 Unspecified dementia without behavioral disturbance: Secondary | ICD-10-CM | POA: Diagnosis not present

## 2017-01-10 DIAGNOSIS — N186 End stage renal disease: Secondary | ICD-10-CM | POA: Diagnosis not present

## 2017-01-11 DIAGNOSIS — R1084 Generalized abdominal pain: Secondary | ICD-10-CM | POA: Diagnosis not present

## 2017-01-11 DIAGNOSIS — Z992 Dependence on renal dialysis: Secondary | ICD-10-CM | POA: Diagnosis not present

## 2017-01-11 DIAGNOSIS — F0390 Unspecified dementia without behavioral disturbance: Secondary | ICD-10-CM | POA: Diagnosis not present

## 2017-01-11 DIAGNOSIS — K5900 Constipation, unspecified: Secondary | ICD-10-CM | POA: Diagnosis not present

## 2017-01-11 DIAGNOSIS — R109 Unspecified abdominal pain: Secondary | ICD-10-CM | POA: Diagnosis not present

## 2017-01-11 DIAGNOSIS — R1013 Epigastric pain: Secondary | ICD-10-CM | POA: Diagnosis not present

## 2017-01-11 DIAGNOSIS — N189 Chronic kidney disease, unspecified: Secondary | ICD-10-CM | POA: Diagnosis not present

## 2017-01-11 DIAGNOSIS — N186 End stage renal disease: Secondary | ICD-10-CM | POA: Diagnosis not present

## 2017-01-11 DIAGNOSIS — E1122 Type 2 diabetes mellitus with diabetic chronic kidney disease: Secondary | ICD-10-CM | POA: Diagnosis not present

## 2017-01-11 DIAGNOSIS — Z7982 Long term (current) use of aspirin: Secondary | ICD-10-CM | POA: Diagnosis not present

## 2017-01-11 DIAGNOSIS — K2970 Gastritis, unspecified, without bleeding: Secondary | ICD-10-CM | POA: Diagnosis not present

## 2017-01-11 DIAGNOSIS — I129 Hypertensive chronic kidney disease with stage 1 through stage 4 chronic kidney disease, or unspecified chronic kidney disease: Secondary | ICD-10-CM | POA: Diagnosis not present

## 2017-01-11 DIAGNOSIS — I10 Essential (primary) hypertension: Secondary | ICD-10-CM | POA: Diagnosis not present

## 2017-01-11 DIAGNOSIS — R112 Nausea with vomiting, unspecified: Secondary | ICD-10-CM | POA: Diagnosis not present

## 2017-01-11 DIAGNOSIS — Z79899 Other long term (current) drug therapy: Secondary | ICD-10-CM | POA: Diagnosis not present

## 2017-01-12 DIAGNOSIS — N186 End stage renal disease: Secondary | ICD-10-CM | POA: Diagnosis not present

## 2017-01-12 DIAGNOSIS — F0390 Unspecified dementia without behavioral disturbance: Secondary | ICD-10-CM | POA: Diagnosis not present

## 2017-01-12 DIAGNOSIS — Z992 Dependence on renal dialysis: Secondary | ICD-10-CM | POA: Diagnosis not present

## 2017-01-13 ENCOUNTER — Encounter: Payer: Self-pay | Admitting: *Deleted

## 2017-01-13 DIAGNOSIS — F0390 Unspecified dementia without behavioral disturbance: Secondary | ICD-10-CM | POA: Diagnosis not present

## 2017-01-14 DIAGNOSIS — I509 Heart failure, unspecified: Secondary | ICD-10-CM | POA: Diagnosis not present

## 2017-01-14 DIAGNOSIS — R0602 Shortness of breath: Secondary | ICD-10-CM | POA: Diagnosis not present

## 2017-01-14 DIAGNOSIS — J449 Chronic obstructive pulmonary disease, unspecified: Secondary | ICD-10-CM | POA: Diagnosis not present

## 2017-01-14 DIAGNOSIS — R296 Repeated falls: Secondary | ICD-10-CM | POA: Diagnosis not present

## 2017-01-14 DIAGNOSIS — J9610 Chronic respiratory failure, unspecified whether with hypoxia or hypercapnia: Secondary | ICD-10-CM | POA: Diagnosis not present

## 2017-01-14 DIAGNOSIS — N186 End stage renal disease: Secondary | ICD-10-CM | POA: Diagnosis not present

## 2017-01-14 DIAGNOSIS — F0390 Unspecified dementia without behavioral disturbance: Secondary | ICD-10-CM | POA: Diagnosis not present

## 2017-01-14 DIAGNOSIS — Z992 Dependence on renal dialysis: Secondary | ICD-10-CM | POA: Diagnosis not present

## 2017-01-14 DIAGNOSIS — R269 Unspecified abnormalities of gait and mobility: Secondary | ICD-10-CM | POA: Diagnosis not present

## 2017-01-14 DIAGNOSIS — R2681 Unsteadiness on feet: Secondary | ICD-10-CM | POA: Diagnosis not present

## 2017-01-14 DIAGNOSIS — I5032 Chronic diastolic (congestive) heart failure: Secondary | ICD-10-CM | POA: Diagnosis not present

## 2017-01-15 DIAGNOSIS — F0390 Unspecified dementia without behavioral disturbance: Secondary | ICD-10-CM | POA: Diagnosis not present

## 2017-01-15 DIAGNOSIS — J449 Chronic obstructive pulmonary disease, unspecified: Secondary | ICD-10-CM | POA: Diagnosis not present

## 2017-01-16 DIAGNOSIS — F0390 Unspecified dementia without behavioral disturbance: Secondary | ICD-10-CM | POA: Diagnosis not present

## 2017-01-17 DIAGNOSIS — Z992 Dependence on renal dialysis: Secondary | ICD-10-CM | POA: Diagnosis not present

## 2017-01-17 DIAGNOSIS — N186 End stage renal disease: Secondary | ICD-10-CM | POA: Diagnosis not present

## 2017-01-17 DIAGNOSIS — F0390 Unspecified dementia without behavioral disturbance: Secondary | ICD-10-CM | POA: Diagnosis not present

## 2017-01-18 ENCOUNTER — Other Ambulatory Visit: Payer: Self-pay

## 2017-01-18 ENCOUNTER — Ambulatory Visit (INDEPENDENT_AMBULATORY_CARE_PROVIDER_SITE_OTHER): Payer: Medicare HMO | Admitting: Family Medicine

## 2017-01-18 ENCOUNTER — Encounter: Payer: Self-pay | Admitting: Family Medicine

## 2017-01-18 VITALS — BP 148/78 | HR 72 | Temp 98.3°F | Resp 18 | Ht 66.0 in | Wt 177.0 lb

## 2017-01-18 DIAGNOSIS — H43813 Vitreous degeneration, bilateral: Secondary | ICD-10-CM | POA: Diagnosis not present

## 2017-01-18 DIAGNOSIS — F0390 Unspecified dementia without behavioral disturbance: Secondary | ICD-10-CM | POA: Diagnosis not present

## 2017-01-18 DIAGNOSIS — H34812 Central retinal vein occlusion, left eye, with macular edema: Secondary | ICD-10-CM | POA: Diagnosis not present

## 2017-01-18 DIAGNOSIS — E113293 Type 2 diabetes mellitus with mild nonproliferative diabetic retinopathy without macular edema, bilateral: Secondary | ICD-10-CM | POA: Diagnosis not present

## 2017-01-18 DIAGNOSIS — Z992 Dependence on renal dialysis: Secondary | ICD-10-CM | POA: Diagnosis not present

## 2017-01-18 DIAGNOSIS — K5909 Other constipation: Secondary | ICD-10-CM | POA: Diagnosis not present

## 2017-01-18 DIAGNOSIS — D631 Anemia in chronic kidney disease: Secondary | ICD-10-CM | POA: Diagnosis not present

## 2017-01-18 DIAGNOSIS — H35371 Puckering of macula, right eye: Secondary | ICD-10-CM | POA: Diagnosis not present

## 2017-01-18 DIAGNOSIS — N186 End stage renal disease: Secondary | ICD-10-CM | POA: Diagnosis not present

## 2017-01-18 NOTE — Assessment & Plan Note (Signed)
Change miralax from prn to 1 cap BID for 1 week scheduled then once daily D/c lactulose pt does not want to take She also has stool softner which has been on for years Recommend facility return stool cards as well

## 2017-01-18 NOTE — Progress Notes (Signed)
Subjective:    Patient ID: [name], female    DOB: 1925-12-27, 84 y.o.   MRN: [id number]  Patient presents for Follow-up (is not fasting)  Patient here for ER follow-up.  She was seen in the ER on January 1 secondary to nausea vomiting abdominal pain was found to have severe constipation.  She was given an enema what looks like lactulose.  She did have a bowel movement and her symptoms have improved some.  She states that she had been about 4 days before she had a a bowel movement and presented to the ER.  It looks like she was given a couple more doses of lactulose but intermittently and then it seems the medication was discontinued.  She states that it was not helping she would like to try something different.  She has been on MiraLAX in the past.  She denies any current abdominal pain no nausea or vomiting.  She did have dialysis yesterday.  Last BM yesteday but feels she did not completely empty   Review of her hospital notes he did state that she had some mild pulmonary congestion but no overt heart failure.  Her labs are also stable.  Back in December on the 21st she was actually sent to gastroenterology for concern for iron deficiency anemia possible chronic blood loss.  Her hemoglobin had dropped down to 7.7.  Evaluation by GI her hemoglobin was actually down to 6.9 therefore she was admitted and transfused a couple of units.  The most recent hemoglobin was 10.3 which is been stable.  They recommended that she do stool cards however because of the constipation which may be in conjunction with the fact she was given more iron during her dialysis they have not turned these in.   Today feels good Just returned from eye doctor, had injection in eyes   Review Of Systems:  GEN- denies fatigue, fever, weight loss,weakness, recent illness HEENT- denies eye drainage, change in vision, nasal discharge, CVS- denies chest pain, palpitations RESP- denies SOB, cough, wheeze ABD- denies N/V,  change in stools, abd pain GU- denies dysuria, hematuria, dribbling, incontinence MSK- denies joint pain, muscle aches, injury Neuro- denies headache, dizziness, syncope, seizure activity       Objective:    BP (!) 148/78   Pulse 72   Temp 98.3 F (36.8 C) (Oral)   Resp 18   Ht 5\' 6"  (1.676 m)   Wt 177 lb (80.3 kg)   LMP 12/24/2010   SpO2 98% Comment: 3L/min via [place]  BMI 28.57 kg/m  GEN- NAD, alert and oriented x3 HEENT-wearing sunglasses  pink conjunctiva, MMM, oropharynx clear Neck- Supple, no LAD CVS- RRR, 3/6 systolic murmur RESP-CTAB ABD-NABS,soft,NT,ND EXT- trace abkle edema Pulses- Radial 2+        Assessment & Plan:      Problem List Items Addressed This Visit      Unprioritized   Chronic constipation - Primary    Change miralax from prn to 1 cap BID for 1 week scheduled then once daily D/c lactulose pt does not want to take She also has stool softner which has been on for years Recommend facility return stool cards as well       Anemia in chronic kidney disease    Mixed anemia of iron def and renal disease Hb is stable s/p transfusion no sign of active bleed but possible slow GI bleed Stool cards to be done Renal and GI involved  Note: This dictation was prepared with Dragon dictation along with smaller phrase technology. Any transcriptional errors that result from this process are unintentional.

## 2017-01-18 NOTE — Patient Instructions (Addendum)
Miralax 1 cap full twice a day scheduled for 1 week 01/18/17-01/24/17  then 1 cap full daily scheduled D/C lactulose  Please return stool cards to Dr. Olevia Perches office  F/U 4 months

## 2017-01-18 NOTE — Assessment & Plan Note (Signed)
Mixed anemia of iron def and renal disease Hb is stable s/p transfusion no sign of active bleed but possible slow GI bleed Stool cards to be done Renal and GI involved

## 2017-01-19 DIAGNOSIS — Z992 Dependence on renal dialysis: Secondary | ICD-10-CM | POA: Diagnosis not present

## 2017-01-19 DIAGNOSIS — F0390 Unspecified dementia without behavioral disturbance: Secondary | ICD-10-CM | POA: Diagnosis not present

## 2017-01-19 DIAGNOSIS — N186 End stage renal disease: Secondary | ICD-10-CM | POA: Diagnosis not present

## 2017-01-20 DIAGNOSIS — F0390 Unspecified dementia without behavioral disturbance: Secondary | ICD-10-CM | POA: Diagnosis not present

## 2017-01-21 DIAGNOSIS — Z992 Dependence on renal dialysis: Secondary | ICD-10-CM | POA: Diagnosis not present

## 2017-01-21 DIAGNOSIS — N186 End stage renal disease: Secondary | ICD-10-CM | POA: Diagnosis not present

## 2017-01-21 DIAGNOSIS — F0390 Unspecified dementia without behavioral disturbance: Secondary | ICD-10-CM | POA: Diagnosis not present

## 2017-01-22 DIAGNOSIS — F0390 Unspecified dementia without behavioral disturbance: Secondary | ICD-10-CM | POA: Diagnosis not present

## 2017-01-23 DIAGNOSIS — F0390 Unspecified dementia without behavioral disturbance: Secondary | ICD-10-CM | POA: Diagnosis not present

## 2017-01-24 ENCOUNTER — Telehealth: Payer: Self-pay | Admitting: *Deleted

## 2017-01-24 DIAGNOSIS — N186 End stage renal disease: Secondary | ICD-10-CM | POA: Diagnosis not present

## 2017-01-24 DIAGNOSIS — Z992 Dependence on renal dialysis: Secondary | ICD-10-CM | POA: Diagnosis not present

## 2017-01-24 DIAGNOSIS — F0390 Unspecified dementia without behavioral disturbance: Secondary | ICD-10-CM | POA: Diagnosis not present

## 2017-01-24 NOTE — Telephone Encounter (Signed)
noted 

## 2017-01-24 NOTE — Telephone Encounter (Signed)
Received call from [facility] with [place] [phone] telephone/ [phone]~ fax.   Reports that facility did not fax order to pharmacy for changes to Miralax until 01/24/2017. Order states "Miralax 1 cap full twice a day scheduled for 1 week 01/18/17-01/24/17,  then 1 cap full daily scheduled". Requested new order to change dates.   Call placed to facility. Was advised that order for BID doses was faxed to pharmacy on 01/22/2016. Order was placed on MAR at facility on 01/19/2017. Patient will complete BID doses on 01/25/2017. Patient has been given her dosages from PRN stock.   Pharmacy made aware. Advised that new order is not required.

## 2017-01-25 DIAGNOSIS — F0390 Unspecified dementia without behavioral disturbance: Secondary | ICD-10-CM | POA: Diagnosis not present

## 2017-01-26 DIAGNOSIS — N186 End stage renal disease: Secondary | ICD-10-CM | POA: Diagnosis not present

## 2017-01-26 DIAGNOSIS — F0390 Unspecified dementia without behavioral disturbance: Secondary | ICD-10-CM | POA: Diagnosis not present

## 2017-01-26 DIAGNOSIS — Z992 Dependence on renal dialysis: Secondary | ICD-10-CM | POA: Diagnosis not present

## 2017-01-27 DIAGNOSIS — F0390 Unspecified dementia without behavioral disturbance: Secondary | ICD-10-CM | POA: Diagnosis not present

## 2017-01-28 DIAGNOSIS — F0390 Unspecified dementia without behavioral disturbance: Secondary | ICD-10-CM | POA: Diagnosis not present

## 2017-01-28 DIAGNOSIS — Z992 Dependence on renal dialysis: Secondary | ICD-10-CM | POA: Diagnosis not present

## 2017-01-28 DIAGNOSIS — N186 End stage renal disease: Secondary | ICD-10-CM | POA: Diagnosis not present

## 2017-01-29 DIAGNOSIS — F0390 Unspecified dementia without behavioral disturbance: Secondary | ICD-10-CM | POA: Diagnosis not present

## 2017-01-30 DIAGNOSIS — F0390 Unspecified dementia without behavioral disturbance: Secondary | ICD-10-CM | POA: Diagnosis not present

## 2017-01-31 ENCOUNTER — Telehealth (INDEPENDENT_AMBULATORY_CARE_PROVIDER_SITE_OTHER): Payer: Self-pay | Admitting: *Deleted

## 2017-01-31 DIAGNOSIS — N186 End stage renal disease: Secondary | ICD-10-CM | POA: Diagnosis not present

## 2017-01-31 DIAGNOSIS — Z992 Dependence on renal dialysis: Secondary | ICD-10-CM | POA: Diagnosis not present

## 2017-01-31 DIAGNOSIS — F0390 Unspecified dementia without behavioral disturbance: Secondary | ICD-10-CM | POA: Diagnosis not present

## 2017-01-31 NOTE — Telephone Encounter (Signed)
   Diagnosis:    Result(s)   Card 1:: Negative: 01/25/2017 [facility] Stool    Card 2: Negative:01/26/2017 [facility] Stool   Card 3: Negative:0117/2019 [facility] Stool    Completed by:    HEMOCCULT SENSA DEVELOPER: LOT#: [id number]  EXPIRATION DATE: 2020-10   HEMOCCULT SENSA CARD:  LOT#: [id number]  4R EXPIRATION DATE: 03/20   CARD CONTROL RESULTS:  POSITIVE: Positive NEGATIVE: Negative    ADDITIONAL COMMENTS: Results forwarded to the ordering Provider.

## 2017-02-01 DIAGNOSIS — F0390 Unspecified dementia without behavioral disturbance: Secondary | ICD-10-CM | POA: Diagnosis not present

## 2017-02-02 DIAGNOSIS — N186 End stage renal disease: Secondary | ICD-10-CM | POA: Diagnosis not present

## 2017-02-02 DIAGNOSIS — Z992 Dependence on renal dialysis: Secondary | ICD-10-CM | POA: Diagnosis not present

## 2017-02-02 DIAGNOSIS — F0390 Unspecified dementia without behavioral disturbance: Secondary | ICD-10-CM | POA: Diagnosis not present

## 2017-02-03 DIAGNOSIS — F0390 Unspecified dementia without behavioral disturbance: Secondary | ICD-10-CM | POA: Diagnosis not present

## 2017-02-04 DIAGNOSIS — Z992 Dependence on renal dialysis: Secondary | ICD-10-CM | POA: Diagnosis not present

## 2017-02-04 DIAGNOSIS — F0390 Unspecified dementia without behavioral disturbance: Secondary | ICD-10-CM | POA: Diagnosis not present

## 2017-02-04 DIAGNOSIS — N186 End stage renal disease: Secondary | ICD-10-CM | POA: Diagnosis not present

## 2017-02-05 DIAGNOSIS — F0390 Unspecified dementia without behavioral disturbance: Secondary | ICD-10-CM | POA: Diagnosis not present

## 2017-02-06 DIAGNOSIS — F0390 Unspecified dementia without behavioral disturbance: Secondary | ICD-10-CM | POA: Diagnosis not present

## 2017-02-07 DIAGNOSIS — Z992 Dependence on renal dialysis: Secondary | ICD-10-CM | POA: Diagnosis not present

## 2017-02-07 DIAGNOSIS — F0390 Unspecified dementia without behavioral disturbance: Secondary | ICD-10-CM | POA: Diagnosis not present

## 2017-02-07 DIAGNOSIS — N186 End stage renal disease: Secondary | ICD-10-CM | POA: Diagnosis not present

## 2017-02-08 DIAGNOSIS — F0390 Unspecified dementia without behavioral disturbance: Secondary | ICD-10-CM | POA: Diagnosis not present

## 2017-02-09 DIAGNOSIS — F0390 Unspecified dementia without behavioral disturbance: Secondary | ICD-10-CM | POA: Diagnosis not present

## 2017-02-09 DIAGNOSIS — Z992 Dependence on renal dialysis: Secondary | ICD-10-CM | POA: Diagnosis not present

## 2017-02-09 DIAGNOSIS — N186 End stage renal disease: Secondary | ICD-10-CM | POA: Diagnosis not present

## 2017-02-10 DIAGNOSIS — N186 End stage renal disease: Secondary | ICD-10-CM | POA: Diagnosis not present

## 2017-02-10 DIAGNOSIS — F0390 Unspecified dementia without behavioral disturbance: Secondary | ICD-10-CM | POA: Diagnosis not present

## 2017-02-10 DIAGNOSIS — Z992 Dependence on renal dialysis: Secondary | ICD-10-CM | POA: Diagnosis not present

## 2017-02-11 DIAGNOSIS — Z992 Dependence on renal dialysis: Secondary | ICD-10-CM | POA: Diagnosis not present

## 2017-02-11 DIAGNOSIS — F0390 Unspecified dementia without behavioral disturbance: Secondary | ICD-10-CM | POA: Diagnosis not present

## 2017-02-11 DIAGNOSIS — N186 End stage renal disease: Secondary | ICD-10-CM | POA: Diagnosis not present

## 2017-02-12 DIAGNOSIS — F0390 Unspecified dementia without behavioral disturbance: Secondary | ICD-10-CM | POA: Diagnosis not present

## 2017-02-13 DIAGNOSIS — F0390 Unspecified dementia without behavioral disturbance: Secondary | ICD-10-CM | POA: Diagnosis not present

## 2017-02-14 DIAGNOSIS — R0602 Shortness of breath: Secondary | ICD-10-CM | POA: Diagnosis not present

## 2017-02-14 DIAGNOSIS — I5032 Chronic diastolic (congestive) heart failure: Secondary | ICD-10-CM | POA: Diagnosis not present

## 2017-02-14 DIAGNOSIS — R296 Repeated falls: Secondary | ICD-10-CM | POA: Diagnosis not present

## 2017-02-14 DIAGNOSIS — N186 End stage renal disease: Secondary | ICD-10-CM | POA: Diagnosis not present

## 2017-02-14 DIAGNOSIS — F0390 Unspecified dementia without behavioral disturbance: Secondary | ICD-10-CM | POA: Diagnosis not present

## 2017-02-14 DIAGNOSIS — Z992 Dependence on renal dialysis: Secondary | ICD-10-CM | POA: Diagnosis not present

## 2017-02-14 DIAGNOSIS — R2681 Unsteadiness on feet: Secondary | ICD-10-CM | POA: Diagnosis not present

## 2017-02-14 DIAGNOSIS — I509 Heart failure, unspecified: Secondary | ICD-10-CM | POA: Diagnosis not present

## 2017-02-14 DIAGNOSIS — R269 Unspecified abnormalities of gait and mobility: Secondary | ICD-10-CM | POA: Diagnosis not present

## 2017-02-14 DIAGNOSIS — J9610 Chronic respiratory failure, unspecified whether with hypoxia or hypercapnia: Secondary | ICD-10-CM | POA: Diagnosis not present

## 2017-02-14 DIAGNOSIS — J449 Chronic obstructive pulmonary disease, unspecified: Secondary | ICD-10-CM | POA: Diagnosis not present

## 2017-02-15 DIAGNOSIS — F0390 Unspecified dementia without behavioral disturbance: Secondary | ICD-10-CM | POA: Diagnosis not present

## 2017-02-16 DIAGNOSIS — Z992 Dependence on renal dialysis: Secondary | ICD-10-CM | POA: Diagnosis not present

## 2017-02-16 DIAGNOSIS — F0390 Unspecified dementia without behavioral disturbance: Secondary | ICD-10-CM | POA: Diagnosis not present

## 2017-02-16 DIAGNOSIS — N186 End stage renal disease: Secondary | ICD-10-CM | POA: Diagnosis not present

## 2017-02-17 DIAGNOSIS — F0390 Unspecified dementia without behavioral disturbance: Secondary | ICD-10-CM | POA: Diagnosis not present

## 2017-02-18 DIAGNOSIS — N186 End stage renal disease: Secondary | ICD-10-CM | POA: Diagnosis not present

## 2017-02-18 DIAGNOSIS — Z992 Dependence on renal dialysis: Secondary | ICD-10-CM | POA: Diagnosis not present

## 2017-02-18 DIAGNOSIS — F0390 Unspecified dementia without behavioral disturbance: Secondary | ICD-10-CM | POA: Diagnosis not present

## 2017-02-19 DIAGNOSIS — F0390 Unspecified dementia without behavioral disturbance: Secondary | ICD-10-CM | POA: Diagnosis not present

## 2017-02-20 DIAGNOSIS — F0390 Unspecified dementia without behavioral disturbance: Secondary | ICD-10-CM | POA: Diagnosis not present

## 2017-02-21 DIAGNOSIS — N186 End stage renal disease: Secondary | ICD-10-CM | POA: Diagnosis not present

## 2017-02-21 DIAGNOSIS — F0390 Unspecified dementia without behavioral disturbance: Secondary | ICD-10-CM | POA: Diagnosis not present

## 2017-02-21 DIAGNOSIS — Z992 Dependence on renal dialysis: Secondary | ICD-10-CM | POA: Diagnosis not present

## 2017-02-22 DIAGNOSIS — F0390 Unspecified dementia without behavioral disturbance: Secondary | ICD-10-CM | POA: Diagnosis not present

## 2017-02-23 DIAGNOSIS — N186 End stage renal disease: Secondary | ICD-10-CM | POA: Diagnosis not present

## 2017-02-23 DIAGNOSIS — F0390 Unspecified dementia without behavioral disturbance: Secondary | ICD-10-CM | POA: Diagnosis not present

## 2017-02-23 DIAGNOSIS — Z992 Dependence on renal dialysis: Secondary | ICD-10-CM | POA: Diagnosis not present

## 2017-02-24 DIAGNOSIS — F0390 Unspecified dementia without behavioral disturbance: Secondary | ICD-10-CM | POA: Diagnosis not present

## 2017-02-25 DIAGNOSIS — Z992 Dependence on renal dialysis: Secondary | ICD-10-CM | POA: Diagnosis not present

## 2017-02-25 DIAGNOSIS — F0390 Unspecified dementia without behavioral disturbance: Secondary | ICD-10-CM | POA: Diagnosis not present

## 2017-02-25 DIAGNOSIS — N186 End stage renal disease: Secondary | ICD-10-CM | POA: Diagnosis not present

## 2017-02-26 DIAGNOSIS — F0390 Unspecified dementia without behavioral disturbance: Secondary | ICD-10-CM | POA: Diagnosis not present

## 2017-02-27 DIAGNOSIS — F0390 Unspecified dementia without behavioral disturbance: Secondary | ICD-10-CM | POA: Diagnosis not present

## 2017-02-28 ENCOUNTER — Other Ambulatory Visit: Payer: Self-pay | Admitting: Family Medicine

## 2017-02-28 DIAGNOSIS — N186 End stage renal disease: Secondary | ICD-10-CM | POA: Diagnosis not present

## 2017-02-28 DIAGNOSIS — F0390 Unspecified dementia without behavioral disturbance: Secondary | ICD-10-CM | POA: Diagnosis not present

## 2017-02-28 DIAGNOSIS — Z992 Dependence on renal dialysis: Secondary | ICD-10-CM | POA: Diagnosis not present

## 2017-03-01 DIAGNOSIS — F0390 Unspecified dementia without behavioral disturbance: Secondary | ICD-10-CM | POA: Diagnosis not present

## 2017-03-02 DIAGNOSIS — Z992 Dependence on renal dialysis: Secondary | ICD-10-CM | POA: Diagnosis not present

## 2017-03-02 DIAGNOSIS — N186 End stage renal disease: Secondary | ICD-10-CM | POA: Diagnosis not present

## 2017-03-02 DIAGNOSIS — F0390 Unspecified dementia without behavioral disturbance: Secondary | ICD-10-CM | POA: Diagnosis not present

## 2017-03-03 DIAGNOSIS — F0390 Unspecified dementia without behavioral disturbance: Secondary | ICD-10-CM | POA: Diagnosis not present

## 2017-03-04 DIAGNOSIS — N186 End stage renal disease: Secondary | ICD-10-CM | POA: Diagnosis not present

## 2017-03-04 DIAGNOSIS — Z992 Dependence on renal dialysis: Secondary | ICD-10-CM | POA: Diagnosis not present

## 2017-03-04 DIAGNOSIS — F0390 Unspecified dementia without behavioral disturbance: Secondary | ICD-10-CM | POA: Diagnosis not present

## 2017-03-05 DIAGNOSIS — F0390 Unspecified dementia without behavioral disturbance: Secondary | ICD-10-CM | POA: Diagnosis not present

## 2017-03-06 DIAGNOSIS — F0390 Unspecified dementia without behavioral disturbance: Secondary | ICD-10-CM | POA: Diagnosis not present

## 2017-03-07 DIAGNOSIS — N186 End stage renal disease: Secondary | ICD-10-CM | POA: Diagnosis not present

## 2017-03-07 DIAGNOSIS — Z992 Dependence on renal dialysis: Secondary | ICD-10-CM | POA: Diagnosis not present

## 2017-03-07 DIAGNOSIS — F0390 Unspecified dementia without behavioral disturbance: Secondary | ICD-10-CM | POA: Diagnosis not present

## 2017-03-08 DIAGNOSIS — H34812 Central retinal vein occlusion, left eye, with macular edema: Secondary | ICD-10-CM | POA: Diagnosis not present

## 2017-03-08 DIAGNOSIS — H3563 Retinal hemorrhage, bilateral: Secondary | ICD-10-CM | POA: Diagnosis not present

## 2017-03-08 DIAGNOSIS — H35371 Puckering of macula, right eye: Secondary | ICD-10-CM | POA: Diagnosis not present

## 2017-03-08 DIAGNOSIS — F0390 Unspecified dementia without behavioral disturbance: Secondary | ICD-10-CM | POA: Diagnosis not present

## 2017-03-08 DIAGNOSIS — E113293 Type 2 diabetes mellitus with mild nonproliferative diabetic retinopathy without macular edema, bilateral: Secondary | ICD-10-CM | POA: Diagnosis not present

## 2017-03-09 DIAGNOSIS — N186 End stage renal disease: Secondary | ICD-10-CM | POA: Diagnosis not present

## 2017-03-09 DIAGNOSIS — F0390 Unspecified dementia without behavioral disturbance: Secondary | ICD-10-CM | POA: Diagnosis not present

## 2017-03-09 DIAGNOSIS — Z992 Dependence on renal dialysis: Secondary | ICD-10-CM | POA: Diagnosis not present

## 2017-03-10 DIAGNOSIS — F0390 Unspecified dementia without behavioral disturbance: Secondary | ICD-10-CM | POA: Diagnosis not present

## 2017-03-10 DIAGNOSIS — Z992 Dependence on renal dialysis: Secondary | ICD-10-CM | POA: Diagnosis not present

## 2017-03-10 DIAGNOSIS — N186 End stage renal disease: Secondary | ICD-10-CM | POA: Diagnosis not present

## 2017-03-11 DIAGNOSIS — N186 End stage renal disease: Secondary | ICD-10-CM | POA: Diagnosis not present

## 2017-03-11 DIAGNOSIS — F0390 Unspecified dementia without behavioral disturbance: Secondary | ICD-10-CM | POA: Diagnosis not present

## 2017-03-11 DIAGNOSIS — Z992 Dependence on renal dialysis: Secondary | ICD-10-CM | POA: Diagnosis not present

## 2017-03-12 DIAGNOSIS — F0390 Unspecified dementia without behavioral disturbance: Secondary | ICD-10-CM | POA: Diagnosis not present

## 2017-03-13 DIAGNOSIS — F0390 Unspecified dementia without behavioral disturbance: Secondary | ICD-10-CM | POA: Diagnosis not present

## 2017-03-14 DIAGNOSIS — I509 Heart failure, unspecified: Secondary | ICD-10-CM | POA: Diagnosis not present

## 2017-03-14 DIAGNOSIS — J449 Chronic obstructive pulmonary disease, unspecified: Secondary | ICD-10-CM | POA: Diagnosis not present

## 2017-03-14 DIAGNOSIS — Z992 Dependence on renal dialysis: Secondary | ICD-10-CM | POA: Diagnosis not present

## 2017-03-14 DIAGNOSIS — F0390 Unspecified dementia without behavioral disturbance: Secondary | ICD-10-CM | POA: Diagnosis not present

## 2017-03-14 DIAGNOSIS — R2681 Unsteadiness on feet: Secondary | ICD-10-CM | POA: Diagnosis not present

## 2017-03-14 DIAGNOSIS — R296 Repeated falls: Secondary | ICD-10-CM | POA: Diagnosis not present

## 2017-03-14 DIAGNOSIS — I5032 Chronic diastolic (congestive) heart failure: Secondary | ICD-10-CM | POA: Diagnosis not present

## 2017-03-14 DIAGNOSIS — J9610 Chronic respiratory failure, unspecified whether with hypoxia or hypercapnia: Secondary | ICD-10-CM | POA: Diagnosis not present

## 2017-03-14 DIAGNOSIS — R269 Unspecified abnormalities of gait and mobility: Secondary | ICD-10-CM | POA: Diagnosis not present

## 2017-03-14 DIAGNOSIS — R0602 Shortness of breath: Secondary | ICD-10-CM | POA: Diagnosis not present

## 2017-03-14 DIAGNOSIS — N186 End stage renal disease: Secondary | ICD-10-CM | POA: Diagnosis not present

## 2017-03-15 DIAGNOSIS — F0390 Unspecified dementia without behavioral disturbance: Secondary | ICD-10-CM | POA: Diagnosis not present

## 2017-03-16 DIAGNOSIS — Z992 Dependence on renal dialysis: Secondary | ICD-10-CM | POA: Diagnosis not present

## 2017-03-16 DIAGNOSIS — N186 End stage renal disease: Secondary | ICD-10-CM | POA: Diagnosis not present

## 2017-03-16 DIAGNOSIS — F0390 Unspecified dementia without behavioral disturbance: Secondary | ICD-10-CM | POA: Diagnosis not present

## 2017-03-17 DIAGNOSIS — F0390 Unspecified dementia without behavioral disturbance: Secondary | ICD-10-CM | POA: Diagnosis not present

## 2017-03-18 DIAGNOSIS — N186 End stage renal disease: Secondary | ICD-10-CM | POA: Diagnosis not present

## 2017-03-18 DIAGNOSIS — F0390 Unspecified dementia without behavioral disturbance: Secondary | ICD-10-CM | POA: Diagnosis not present

## 2017-03-18 DIAGNOSIS — Z992 Dependence on renal dialysis: Secondary | ICD-10-CM | POA: Diagnosis not present

## 2017-03-19 DIAGNOSIS — F0390 Unspecified dementia without behavioral disturbance: Secondary | ICD-10-CM | POA: Diagnosis not present

## 2017-03-20 DIAGNOSIS — F0390 Unspecified dementia without behavioral disturbance: Secondary | ICD-10-CM | POA: Diagnosis not present

## 2017-03-21 DIAGNOSIS — F0390 Unspecified dementia without behavioral disturbance: Secondary | ICD-10-CM | POA: Diagnosis not present

## 2017-03-21 DIAGNOSIS — Z992 Dependence on renal dialysis: Secondary | ICD-10-CM | POA: Diagnosis not present

## 2017-03-21 DIAGNOSIS — N186 End stage renal disease: Secondary | ICD-10-CM | POA: Diagnosis not present

## 2017-03-22 DIAGNOSIS — F0390 Unspecified dementia without behavioral disturbance: Secondary | ICD-10-CM | POA: Diagnosis not present

## 2017-03-23 DIAGNOSIS — F0390 Unspecified dementia without behavioral disturbance: Secondary | ICD-10-CM | POA: Diagnosis not present

## 2017-03-23 DIAGNOSIS — Z992 Dependence on renal dialysis: Secondary | ICD-10-CM | POA: Diagnosis not present

## 2017-03-23 DIAGNOSIS — N186 End stage renal disease: Secondary | ICD-10-CM | POA: Diagnosis not present

## 2017-03-24 ENCOUNTER — Other Ambulatory Visit: Payer: Self-pay | Admitting: Family Medicine

## 2017-03-24 DIAGNOSIS — F0390 Unspecified dementia without behavioral disturbance: Secondary | ICD-10-CM | POA: Diagnosis not present

## 2017-03-25 DIAGNOSIS — N186 End stage renal disease: Secondary | ICD-10-CM | POA: Diagnosis not present

## 2017-03-25 DIAGNOSIS — F0390 Unspecified dementia without behavioral disturbance: Secondary | ICD-10-CM | POA: Diagnosis not present

## 2017-03-25 DIAGNOSIS — Z992 Dependence on renal dialysis: Secondary | ICD-10-CM | POA: Diagnosis not present

## 2017-03-26 DIAGNOSIS — F0390 Unspecified dementia without behavioral disturbance: Secondary | ICD-10-CM | POA: Diagnosis not present

## 2017-03-27 DIAGNOSIS — F0390 Unspecified dementia without behavioral disturbance: Secondary | ICD-10-CM | POA: Diagnosis not present

## 2017-03-28 ENCOUNTER — Telehealth: Payer: Self-pay | Admitting: Family Medicine

## 2017-03-28 DIAGNOSIS — M25561 Pain in right knee: Secondary | ICD-10-CM | POA: Diagnosis not present

## 2017-03-28 DIAGNOSIS — Z7982 Long term (current) use of aspirin: Secondary | ICD-10-CM | POA: Diagnosis not present

## 2017-03-28 DIAGNOSIS — I129 Hypertensive chronic kidney disease with stage 1 through stage 4 chronic kidney disease, or unspecified chronic kidney disease: Secondary | ICD-10-CM | POA: Diagnosis not present

## 2017-03-28 DIAGNOSIS — F0390 Unspecified dementia without behavioral disturbance: Secondary | ICD-10-CM | POA: Diagnosis not present

## 2017-03-28 DIAGNOSIS — X58XXXA Exposure to other specified factors, initial encounter: Secondary | ICD-10-CM | POA: Diagnosis not present

## 2017-03-28 DIAGNOSIS — R0602 Shortness of breath: Secondary | ICD-10-CM | POA: Diagnosis not present

## 2017-03-28 DIAGNOSIS — Z79899 Other long term (current) drug therapy: Secondary | ICD-10-CM | POA: Diagnosis not present

## 2017-03-28 DIAGNOSIS — M79604 Pain in right leg: Secondary | ICD-10-CM | POA: Diagnosis not present

## 2017-03-28 DIAGNOSIS — N189 Chronic kidney disease, unspecified: Secondary | ICD-10-CM | POA: Diagnosis not present

## 2017-03-28 DIAGNOSIS — J9602 Acute respiratory failure with hypercapnia: Secondary | ICD-10-CM | POA: Diagnosis not present

## 2017-03-28 DIAGNOSIS — R0689 Other abnormalities of breathing: Secondary | ICD-10-CM | POA: Diagnosis not present

## 2017-03-28 DIAGNOSIS — E1122 Type 2 diabetes mellitus with diabetic chronic kidney disease: Secondary | ICD-10-CM | POA: Diagnosis not present

## 2017-03-28 DIAGNOSIS — S76911A Strain of unspecified muscles, fascia and tendons at thigh level, right thigh, initial encounter: Secondary | ICD-10-CM | POA: Diagnosis not present

## 2017-03-28 DIAGNOSIS — Z992 Dependence on renal dialysis: Secondary | ICD-10-CM | POA: Diagnosis not present

## 2017-03-28 DIAGNOSIS — N186 End stage renal disease: Secondary | ICD-10-CM | POA: Diagnosis not present

## 2017-03-28 NOTE — Telephone Encounter (Signed)
Received phone call from [facility] stating that was complaining of leg pain with swelling. Stated that leg pain started on Friday and nothing is relieving pain. Was told patient is currently at Dialysis. Reviewed Providers schedule and no appointments are available today. Advised that patient been seen at ER for evaluation of site and follow up with [place] after discharge. Personnel at [facility] verbalized understanding.

## 2017-03-29 DIAGNOSIS — F0390 Unspecified dementia without behavioral disturbance: Secondary | ICD-10-CM | POA: Diagnosis not present

## 2017-03-30 DIAGNOSIS — F0390 Unspecified dementia without behavioral disturbance: Secondary | ICD-10-CM | POA: Diagnosis not present

## 2017-03-30 DIAGNOSIS — N186 End stage renal disease: Secondary | ICD-10-CM | POA: Diagnosis not present

## 2017-03-30 DIAGNOSIS — Z992 Dependence on renal dialysis: Secondary | ICD-10-CM | POA: Diagnosis not present

## 2017-03-31 DIAGNOSIS — Z992 Dependence on renal dialysis: Secondary | ICD-10-CM | POA: Diagnosis not present

## 2017-03-31 DIAGNOSIS — F0390 Unspecified dementia without behavioral disturbance: Secondary | ICD-10-CM | POA: Diagnosis not present

## 2017-03-31 DIAGNOSIS — N186 End stage renal disease: Secondary | ICD-10-CM | POA: Diagnosis not present

## 2017-04-01 DIAGNOSIS — N186 End stage renal disease: Secondary | ICD-10-CM | POA: Diagnosis not present

## 2017-04-01 DIAGNOSIS — F0390 Unspecified dementia without behavioral disturbance: Secondary | ICD-10-CM | POA: Diagnosis not present

## 2017-04-01 DIAGNOSIS — Z992 Dependence on renal dialysis: Secondary | ICD-10-CM | POA: Diagnosis not present

## 2017-04-02 DIAGNOSIS — F0390 Unspecified dementia without behavioral disturbance: Secondary | ICD-10-CM | POA: Diagnosis not present

## 2017-04-03 DIAGNOSIS — F0390 Unspecified dementia without behavioral disturbance: Secondary | ICD-10-CM | POA: Diagnosis not present

## 2017-04-04 ENCOUNTER — Encounter (HOSPITAL_COMMUNITY): Payer: Self-pay | Admitting: *Deleted

## 2017-04-04 ENCOUNTER — Other Ambulatory Visit: Payer: Self-pay

## 2017-04-04 DIAGNOSIS — F0390 Unspecified dementia without behavioral disturbance: Secondary | ICD-10-CM | POA: Diagnosis not present

## 2017-04-04 NOTE — Progress Notes (Signed)
I spoke to patient and [place], patients Copy at [employer].  I instructed Dessie to check CBG after awaking and every 2 hours until arrival  to the hospital.  I Instructed patient if CBG is less than 70 to drink 1/2 cup of a clear juice. Recheck CBG in 15 minutes then call pre- op desk at [phone] for further instructions.

## 2017-04-05 ENCOUNTER — Encounter (HOSPITAL_COMMUNITY): Payer: Self-pay | Admitting: Family Medicine

## 2017-04-05 ENCOUNTER — Observation Stay (HOSPITAL_COMMUNITY)
Admission: AD | Admit: 2017-04-05 | Discharge: 2017-04-07 | Payer: Medicare HMO | Source: Ambulatory Visit | Attending: Internal Medicine | Admitting: Internal Medicine

## 2017-04-05 ENCOUNTER — Other Ambulatory Visit: Payer: Self-pay

## 2017-04-05 ENCOUNTER — Encounter (HOSPITAL_COMMUNITY)
Admission: AD | Disposition: A | Discharge status: Other Acute Inpatient Hospital | Payer: Self-pay | Source: Ambulatory Visit | Attending: Internal Medicine

## 2017-04-05 DIAGNOSIS — E875 Hyperkalemia: Secondary | ICD-10-CM | POA: Diagnosis not present

## 2017-04-05 DIAGNOSIS — Z789 Other specified health status: Secondary | ICD-10-CM | POA: Diagnosis present

## 2017-04-05 DIAGNOSIS — J9610 Chronic respiratory failure, unspecified whether with hypoxia or hypercapnia: Secondary | ICD-10-CM | POA: Diagnosis present

## 2017-04-05 DIAGNOSIS — N186 End stage renal disease: Secondary | ICD-10-CM

## 2017-04-05 DIAGNOSIS — M19011 Primary osteoarthritis, right shoulder: Secondary | ICD-10-CM | POA: Diagnosis not present

## 2017-04-05 DIAGNOSIS — E1122 Type 2 diabetes mellitus with diabetic chronic kidney disease: Secondary | ICD-10-CM | POA: Insufficient documentation

## 2017-04-05 DIAGNOSIS — I509 Heart failure, unspecified: Secondary | ICD-10-CM | POA: Insufficient documentation

## 2017-04-05 DIAGNOSIS — I69351 Hemiplegia and hemiparesis following cerebral infarction affecting right dominant side: Secondary | ICD-10-CM | POA: Insufficient documentation

## 2017-04-05 DIAGNOSIS — F329 Major depressive disorder, single episode, unspecified: Secondary | ICD-10-CM | POA: Diagnosis not present

## 2017-04-05 DIAGNOSIS — I160 Hypertensive urgency: Secondary | ICD-10-CM | POA: Diagnosis not present

## 2017-04-05 DIAGNOSIS — Z992 Dependence on renal dialysis: Secondary | ICD-10-CM | POA: Diagnosis not present

## 2017-04-05 DIAGNOSIS — K449 Diaphragmatic hernia without obstruction or gangrene: Secondary | ICD-10-CM | POA: Insufficient documentation

## 2017-04-05 DIAGNOSIS — I10 Essential (primary) hypertension: Secondary | ICD-10-CM | POA: Diagnosis not present

## 2017-04-05 DIAGNOSIS — Z9981 Dependence on supplemental oxygen: Secondary | ICD-10-CM | POA: Insufficient documentation

## 2017-04-05 DIAGNOSIS — H9190 Unspecified hearing loss, unspecified ear: Secondary | ICD-10-CM | POA: Insufficient documentation

## 2017-04-05 DIAGNOSIS — K219 Gastro-esophageal reflux disease without esophagitis: Secondary | ICD-10-CM | POA: Diagnosis not present

## 2017-04-05 DIAGNOSIS — F32A Depression, unspecified: Secondary | ICD-10-CM | POA: Diagnosis present

## 2017-04-05 DIAGNOSIS — Z8249 Family history of ischemic heart disease and other diseases of the circulatory system: Secondary | ICD-10-CM | POA: Insufficient documentation

## 2017-04-05 DIAGNOSIS — Z452 Encounter for adjustment and management of vascular access device: Secondary | ICD-10-CM

## 2017-04-05 DIAGNOSIS — J9611 Chronic respiratory failure with hypoxia: Secondary | ICD-10-CM

## 2017-04-05 DIAGNOSIS — E861 Hypovolemia: Secondary | ICD-10-CM | POA: Diagnosis not present

## 2017-04-05 DIAGNOSIS — N3281 Overactive bladder: Secondary | ICD-10-CM | POA: Insufficient documentation

## 2017-04-05 DIAGNOSIS — J449 Chronic obstructive pulmonary disease, unspecified: Secondary | ICD-10-CM | POA: Insufficient documentation

## 2017-04-05 DIAGNOSIS — Z90710 Acquired absence of both cervix and uterus: Secondary | ICD-10-CM | POA: Diagnosis not present

## 2017-04-05 DIAGNOSIS — E785 Hyperlipidemia, unspecified: Secondary | ICD-10-CM | POA: Diagnosis not present

## 2017-04-05 DIAGNOSIS — E038 Other specified hypothyroidism: Secondary | ICD-10-CM | POA: Diagnosis not present

## 2017-04-05 DIAGNOSIS — I132 Hypertensive heart and chronic kidney disease with heart failure and with stage 5 chronic kidney disease, or end stage renal disease: Secondary | ICD-10-CM | POA: Diagnosis not present

## 2017-04-05 DIAGNOSIS — Z823 Family history of stroke: Secondary | ICD-10-CM | POA: Insufficient documentation

## 2017-04-05 DIAGNOSIS — E039 Hypothyroidism, unspecified: Secondary | ICD-10-CM | POA: Diagnosis present

## 2017-04-05 DIAGNOSIS — N19 Unspecified kidney failure: Secondary | ICD-10-CM | POA: Diagnosis present

## 2017-04-05 DIAGNOSIS — F419 Anxiety disorder, unspecified: Secondary | ICD-10-CM | POA: Diagnosis not present

## 2017-04-05 DIAGNOSIS — D631 Anemia in chronic kidney disease: Secondary | ICD-10-CM | POA: Diagnosis not present

## 2017-04-05 DIAGNOSIS — K581 Irritable bowel syndrome with constipation: Secondary | ICD-10-CM | POA: Insufficient documentation

## 2017-04-05 DIAGNOSIS — Z79899 Other long term (current) drug therapy: Secondary | ICD-10-CM | POA: Insufficient documentation

## 2017-04-05 DIAGNOSIS — E0821 Diabetes mellitus due to underlying condition with diabetic nephropathy: Secondary | ICD-10-CM

## 2017-04-05 DIAGNOSIS — T82898A Other specified complication of vascular prosthetic devices, implants and grafts, initial encounter: Secondary | ICD-10-CM | POA: Diagnosis not present

## 2017-04-05 DIAGNOSIS — I471 Supraventricular tachycardia: Secondary | ICD-10-CM | POA: Insufficient documentation

## 2017-04-05 DIAGNOSIS — Z809 Family history of malignant neoplasm, unspecified: Secondary | ICD-10-CM | POA: Insufficient documentation

## 2017-04-05 DIAGNOSIS — E1129 Type 2 diabetes mellitus with other diabetic kidney complication: Secondary | ICD-10-CM | POA: Diagnosis present

## 2017-04-05 DIAGNOSIS — X58XXXA Exposure to other specified factors, initial encounter: Secondary | ICD-10-CM | POA: Insufficient documentation

## 2017-04-05 HISTORY — DX: End stage renal disease: N18.6

## 2017-04-05 HISTORY — DX: Hypothyroidism, unspecified: E03.9

## 2017-04-05 HISTORY — DX: Unspecified hearing loss, unspecified ear: H91.90

## 2017-04-05 HISTORY — DX: Dyspnea, unspecified: R06.00

## 2017-04-05 LAB — RENAL FUNCTION PANEL
ALBUMIN: 3.5 g/dL (ref 3.5–5.0)
ANION GAP: 16 — AB (ref 5–15)
BUN: 81 mg/dL — ABNORMAL HIGH (ref 6–20)
CHLORIDE: 97 mmol/L — AB (ref 101–111)
CO2: 26 mmol/L (ref 22–32)
Calcium: 8.6 mg/dL — ABNORMAL LOW (ref 8.9–10.3)
Creatinine, Ser: 10.93 mg/dL — ABNORMAL HIGH (ref 0.44–1.00)
GFR, EST AFRICAN AMERICAN: 3 mL/min — AB (ref >60)
GFR, EST NON AFRICAN AMERICAN: 3 mL/min — AB (ref >60)
Glucose, Bld: 192 mg/dL — ABNORMAL HIGH (ref 65–99)
PHOSPHORUS: 4.9 mg/dL — AB (ref 2.5–4.6)
Potassium: 5.7 mmol/L — ABNORMAL HIGH (ref 3.5–5.1)
Sodium: 139 mmol/L (ref 135–145)

## 2017-04-05 LAB — BASIC METABOLIC PANEL
ANION GAP: 18 — AB (ref 5–15)
BUN: 81 mg/dL — ABNORMAL HIGH (ref 6–20)
CALCIUM: 8.6 mg/dL — AB (ref 8.9–10.3)
CO2: 24 mmol/L (ref 22–32)
Chloride: 96 mmol/L — ABNORMAL LOW (ref 101–111)
Creatinine, Ser: 11.03 mg/dL — ABNORMAL HIGH (ref 0.44–1.00)
GFR calc Af Amer: 3 mL/min — ABNORMAL LOW (ref >60)
GFR calc non Af Amer: 3 mL/min — ABNORMAL LOW (ref >60)
GLUCOSE: 195 mg/dL — AB (ref 65–99)
POTASSIUM: 5.6 mmol/L — AB (ref 3.5–5.1)
Sodium: 138 mmol/L (ref 135–145)

## 2017-04-05 LAB — CBC
HEMATOCRIT: 30.5 % — AB (ref 36.0–46.0)
Hemoglobin: 9.7 g/dL — ABNORMAL LOW (ref 12.0–15.0)
MCH: 25.3 pg — ABNORMAL LOW (ref 26.0–34.0)
MCHC: 31.8 g/dL (ref 30.0–36.0)
MCV: 79.4 fL (ref 78.0–100.0)
Platelets: 156 10*3/uL (ref 150–400)
RBC: 3.84 MIL/uL — AB (ref 3.87–5.11)
RDW: 15 % (ref 11.5–15.5)
WBC: 6 10*3/uL (ref 4.0–10.5)

## 2017-04-05 LAB — POCT I-STAT 4, (NA,K, GLUC, HGB,HCT)
GLUCOSE: 116 mg/dL — AB (ref 65–99)
HCT: 33 % — ABNORMAL LOW (ref 36.0–46.0)
HEMOGLOBIN: 11.2 g/dL — AB (ref 12.0–15.0)
POTASSIUM: 6.3 mmol/L — AB (ref 3.5–5.1)
Sodium: 136 mmol/L (ref 135–145)

## 2017-04-05 LAB — SURGICAL PCR SCREEN
MRSA, PCR: NEGATIVE
Staphylococcus aureus: NEGATIVE

## 2017-04-05 LAB — GLUCOSE, CAPILLARY: GLUCOSE-CAPILLARY: 222 mg/dL — AB (ref 65–99)

## 2017-04-05 LAB — POTASSIUM
POTASSIUM: 6.6 mmol/L — AB (ref 3.5–5.1)
Potassium: 5.5 mmol/L — ABNORMAL HIGH (ref 3.5–5.1)

## 2017-04-05 LAB — PROTIME-INR
INR: 1.19
Prothrombin Time: 15 seconds (ref 11.4–15.2)

## 2017-04-05 SURGERY — INSERTION OF DIALYSIS CATHETER
Anesthesia: Choice

## 2017-04-05 MED ORDER — SEVELAMER CARBONATE 800 MG PO TABS
2400.0000 mg | ORAL_TABLET | Freq: Three times a day (TID) | ORAL | Status: DC
  Administered 2017-04-06 – 2017-04-07 (×5): 2400 mg via ORAL
  Filled 2017-04-05 (×5): qty 3

## 2017-04-05 MED ORDER — SODIUM POLYSTYRENE SULFONATE 15 GM/60ML PO SUSP
30.0000 g | Freq: Once | ORAL | Status: AC
  Administered 2017-04-05: 30 g via ORAL
  Filled 2017-04-05: qty 120

## 2017-04-05 MED ORDER — HYDRALAZINE HCL 20 MG/ML IJ SOLN
10.0000 mg | INTRAMUSCULAR | Status: DC | PRN
  Administered 2017-04-06: 10 mg via INTRAVENOUS
  Filled 2017-04-05: qty 1

## 2017-04-05 MED ORDER — CITALOPRAM HYDROBROMIDE 20 MG PO TABS
20.0000 mg | ORAL_TABLET | Freq: Every day | ORAL | Status: DC
  Administered 2017-04-06 – 2017-04-07 (×2): 20 mg via ORAL
  Filled 2017-04-05 (×2): qty 1

## 2017-04-05 MED ORDER — CLONIDINE HCL 0.2 MG PO TABS
0.2000 mg | ORAL_TABLET | Freq: Once | ORAL | Status: AC
  Administered 2017-04-05: 0.2 mg via ORAL
  Filled 2017-04-05: qty 1

## 2017-04-05 MED ORDER — ASPIRIN EC 81 MG PO TBEC
81.0000 mg | DELAYED_RELEASE_TABLET | Freq: Every day | ORAL | Status: DC
  Administered 2017-04-07: 81 mg via ORAL
  Filled 2017-04-05: qty 1

## 2017-04-05 MED ORDER — PANTOPRAZOLE SODIUM 40 MG PO TBEC
40.0000 mg | DELAYED_RELEASE_TABLET | Freq: Every day | ORAL | Status: DC
  Administered 2017-04-06 – 2017-04-07 (×2): 40 mg via ORAL
  Filled 2017-04-05 (×2): qty 1

## 2017-04-05 MED ORDER — SODIUM CHLORIDE 0.9 % IV SOLN: 250.0000 mL | INTRAVENOUS | Status: DC | PRN

## 2017-04-05 MED ORDER — ALBUTEROL SULFATE (2.5 MG/3ML) 0.083% IN NEBU
INHALATION_SOLUTION | RESPIRATORY_TRACT | Status: AC
  Administered 2017-04-05: 2.5 mg via RESPIRATORY_TRACT
  Filled 2017-04-05: qty 3

## 2017-04-05 MED ORDER — SODIUM CHLORIDE 0.9% FLUSH
3.0000 mL | Freq: Two times a day (BID) | INTRAVENOUS | Status: DC
  Administered 2017-04-06 – 2017-04-07 (×2): 3 mL via INTRAVENOUS

## 2017-04-05 MED ORDER — SODIUM CHLORIDE 0.9% FLUSH
3.0000 mL | Freq: Two times a day (BID) | INTRAVENOUS | Status: DC
  Administered 2017-04-05 – 2017-04-07 (×3): 3 mL via INTRAVENOUS

## 2017-04-05 MED ORDER — POLYVINYL ALCOHOL 1.4 % OP SOLN
1.0000 [drp] | OPHTHALMIC | Status: DC | PRN
  Filled 2017-04-05: qty 15

## 2017-04-05 MED ORDER — ACETAMINOPHEN 650 MG RE SUPP: 650.0000 mg | Freq: Four times a day (QID) | RECTAL | Status: DC | PRN

## 2017-04-05 MED ORDER — POLYETHYLENE GLYCOL 3350 17 G PO PACK: 17.0000 g | PACK | Freq: Every day | ORAL | Status: DC | PRN

## 2017-04-05 MED ORDER — SODIUM CHLORIDE 0.9% FLUSH: 3.0000 mL | INTRAVENOUS | Status: DC | PRN

## 2017-04-05 MED ORDER — GUAIFENESIN-DM 100-10 MG/5ML PO SYRP: 5.0000 mL | ORAL_SOLUTION | Freq: Four times a day (QID) | ORAL | Status: DC | PRN

## 2017-04-05 MED ORDER — ONDANSETRON HCL 4 MG/2ML IJ SOLN: 4.0000 mg | Freq: Four times a day (QID) | INTRAMUSCULAR | Status: DC | PRN

## 2017-04-05 MED ORDER — DILTIAZEM HCL ER COATED BEADS 180 MG PO CP24
180.0000 mg | ORAL_CAPSULE | Freq: Every day | ORAL | Status: DC
  Administered 2017-04-06 – 2017-04-07 (×2): 180 mg via ORAL
  Filled 2017-04-05 (×2): qty 1

## 2017-04-05 MED ORDER — NEPRO/CARBSTEADY PO LIQD
237.0000 mL | Freq: Three times a day (TID) | ORAL | Status: DC | PRN
  Filled 2017-04-05: qty 237

## 2017-04-05 MED ORDER — TRAMADOL HCL 50 MG PO TABS
50.0000 mg | ORAL_TABLET | Freq: Four times a day (QID) | ORAL | Status: DC | PRN
Start: 2017-04-05 — End: 2017-04-07
  Administered 2017-04-06: 100 mg via ORAL
  Filled 2017-04-05: qty 2

## 2017-04-05 MED ORDER — MIRTAZAPINE 15 MG PO TABS
7.5000 mg | ORAL_TABLET | Freq: Every day | ORAL | Status: DC
  Administered 2017-04-05 – 2017-04-06 (×2): 7.5 mg via ORAL
  Filled 2017-04-05 (×3): qty 1

## 2017-04-05 MED ORDER — DOCUSATE SODIUM 100 MG PO CAPS
100.0000 mg | ORAL_CAPSULE | Freq: Two times a day (BID) | ORAL | Status: DC
  Administered 2017-04-06 – 2017-04-07 (×3): 100 mg via ORAL
  Filled 2017-04-05 (×3): qty 1

## 2017-04-05 MED ORDER — POLYETHYLENE GLYCOL 3350 17 GM/SCOOP PO POWD: 17.0000 g | ORAL | Status: DC | PRN

## 2017-04-05 MED ORDER — POLYETHYLENE GLYCOL 3350 17 G PO PACK: 17.0000 g | PACK | Freq: Every day | ORAL | Status: DC

## 2017-04-05 MED ORDER — ACETAMINOPHEN 325 MG PO TABS: 650.0000 mg | ORAL_TABLET | Freq: Four times a day (QID) | ORAL | Status: DC | PRN

## 2017-04-05 MED ORDER — INSULIN ASPART 100 UNIT/ML ~~LOC~~ SOLN
0.0000 [IU] | SUBCUTANEOUS | Status: DC
  Administered 2017-04-06: 2 [IU] via SUBCUTANEOUS
  Administered 2017-04-06: 1 [IU] via SUBCUTANEOUS
  Administered 2017-04-06: 2 [IU] via SUBCUTANEOUS
  Administered 2017-04-07: 1 [IU] via SUBCUTANEOUS

## 2017-04-05 MED ORDER — ALBUTEROL SULFATE (2.5 MG/3ML) 0.083% IN NEBU
2.5000 mg | INHALATION_SOLUTION | Freq: Once | RESPIRATORY_TRACT | Status: AC
  Administered 2017-04-05: 2.5 mg via RESPIRATORY_TRACT

## 2017-04-05 MED ORDER — ISOSORBIDE MONONITRATE ER 60 MG PO TB24
60.0000 mg | ORAL_TABLET | Freq: Every day | ORAL | Status: DC
  Administered 2017-04-06 – 2017-04-07 (×2): 60 mg via ORAL
  Filled 2017-04-05 (×2): qty 1

## 2017-04-05 MED ORDER — ALBUTEROL SULFATE (2.5 MG/3ML) 0.083% IN NEBU: 2.5000 mg | INHALATION_SOLUTION | RESPIRATORY_TRACT | Status: DC | PRN

## 2017-04-05 MED ORDER — CINACALCET HCL 30 MG PO TABS
30.0000 mg | ORAL_TABLET | Freq: Every day | ORAL | Status: DC
  Administered 2017-04-06: 30 mg via ORAL
  Filled 2017-04-05: qty 1

## 2017-04-05 MED ORDER — SODIUM CHLORIDE 0.9 % IV SOLN
INTRAVENOUS | Status: DC
  Administered 2017-04-05: 10 mL/h via INTRAVENOUS

## 2017-04-05 MED ORDER — HYDRALAZINE HCL 20 MG/ML IJ SOLN
10.0000 mg | Freq: Once | INTRAMUSCULAR | Status: DC
  Filled 2017-04-05: qty 1

## 2017-04-05 MED ORDER — ONDANSETRON HCL 4 MG PO TABS: 4.0000 mg | ORAL_TABLET | Freq: Four times a day (QID) | ORAL | Status: DC | PRN

## 2017-04-05 MED ORDER — LEVOTHYROXINE SODIUM 25 MCG PO TABS
25.0000 ug | ORAL_TABLET | Freq: Every day | ORAL | Status: DC
  Administered 2017-04-06 – 2017-04-07 (×2): 25 ug via ORAL
  Filled 2017-04-05 (×2): qty 1

## 2017-04-05 MED ORDER — CEFAZOLIN SODIUM-DEXTROSE 2-4 GM/100ML-% IV SOLN
2.0000 g | Freq: Once | INTRAVENOUS | Status: DC
  Filled 2017-04-05: qty 100

## 2017-04-05 NOTE — Progress Notes (Signed)
Per Ilean Skill, PA-C obtain BMET 3 hours after giving Kayexalate

## 2017-04-05 NOTE — Progress Notes (Signed)
Patient transported to [facility] on telemetry. Patient moved to 3E bed.

## 2017-04-05 NOTE — Progress Notes (Signed)
Report given to Raven, RN

## 2017-04-05 NOTE — Progress Notes (Signed)
Patient was admitted to [facility] and oriented to the unit. [facility] was paged. Patient's BP was elevated at admission. MD was paged.Will continue to monitor.   Drue Flirt, RN

## 2017-04-05 NOTE — Progress Notes (Signed)
Notified Dr. Valma Cava of patient eating a boiled egg, piece of toast, oj at 500 am. Dr. Valma Cava stated was okay.

## 2017-04-05 NOTE — Progress Notes (Signed)
Dr. Trula Slade called stating that he is requesting a bed and that [facility] will see patient upon arrival to floor. I made him aware that patient is hypertensive. Spoke to PBT regarding additional labs that were ordered.

## 2017-04-05 NOTE — Progress Notes (Addendum)
Patient has been sinus rhythm on monitor. Alert and oriented x 4, skin warm and dry, denies chest pain or shortness of breath. Awaiting further orders

## 2017-04-05 NOTE — Progress Notes (Signed)
[name]- daughter phone number [phone]

## 2017-04-05 NOTE — Progress Notes (Signed)
Patient provided renal diet. Remains sinus rhythm on monitor. Awaiting arrival of [facility] for admission orders.

## 2017-04-05 NOTE — Progress Notes (Signed)
Patient name: [name] MRN: [id number] DOB: [birth year] Sex: female    HISTORY OF PRESENT ILLNESS:   [name] is a 84 y.o. female who came to [facility] today for initiation of dialysis with plans for a catheter.  This had to be cancelled due to hyperkalemia, which was treated medically.  She was admitted by the hospitalists.   CURRENT MEDICATIONS:    Current Facility-Administered Medications  Medication Dose Route Frequency Provider Last Rate Last Dose  . 0.9 %  sodium chloride infusion  250 mL Intravenous PRN Opyd, Ilene Qua, MD      . acetaminophen (TYLENOL) tablet 650 mg  650 mg Oral Q6H PRN Opyd, Ilene Qua, MD       Or  . acetaminophen (TYLENOL) suppository 650 mg  650 mg Rectal Q6H PRN Opyd, Ilene Qua, MD      . albuterol (PROVENTIL) (2.5 MG/3ML) 0.083% nebulizer solution 2.5 mg  2.5 mg Nebulization Q4H PRN Opyd, Ilene Qua, MD      . Derrill Memo ON 04/07/2017] aspirin EC tablet 81 mg  81 mg Oral Daily Opyd, Ilene Qua, MD      . Derrill Memo ON 04/06/2017] cinacalcet (SENSIPAR) tablet 30 mg  30 mg Oral q1800 Opyd, Ilene Qua, MD      . Derrill Memo ON 04/06/2017] citalopram (CELEXA) tablet 20 mg  20 mg Oral Daily Opyd, Ilene Qua, MD      . Derrill Memo ON 04/06/2017] diltiazem (CARDIZEM CD) 24 hr capsule 180 mg  180 mg Oral Daily Opyd, Ilene Qua, MD      . Derrill Memo ON 04/06/2017] docusate sodium (COLACE) capsule 100 mg  100 mg Oral BID Opyd, Ilene Qua, MD      . feeding supplement (NEPRO CARB STEADY) liquid 237 mL  237 mL Oral TID PRN Opyd, Ilene Qua, MD      . guaiFENesin-dextromethorphan (ROBITUSSIN DM) 100-10 MG/5ML syrup 5 mL  5 mL Oral Q6H PRN Opyd, Ilene Qua, MD      . hydrALAZINE (APRESOLINE) injection 10 mg  10 mg Intravenous Q4H PRN Opyd, Ilene Qua, MD      . insulin aspart (novoLOG) injection 0-9 Units  0-9 Units Subcutaneous Q4H Opyd, Ilene Qua, MD      . Derrill Memo ON 04/06/2017] isosorbide mononitrate (IMDUR) 24 hr tablet 60 mg  60 mg Oral Daily Opyd, Ilene Qua, MD       . Derrill Memo ON 04/06/2017] levothyroxine (SYNTHROID, LEVOTHROID) tablet 25 mcg  25 mcg Oral QAC breakfast Opyd, Ilene Qua, MD      . mirtazapine (REMERON) tablet 7.5 mg  7.5 mg Oral QHS Opyd, Ilene Qua, MD      . ondansetron (ZOFRAN) tablet 4 mg  4 mg Oral Q6H PRN Opyd, Ilene Qua, MD       Or  . ondansetron (ZOFRAN) injection 4 mg  4 mg Intravenous Q6H PRN Opyd, Ilene Qua, MD      . Derrill Memo ON 04/06/2017] pantoprazole (PROTONIX) EC tablet 40 mg  40 mg Oral Daily Opyd, Ilene Qua, MD      . polyethylene glycol (MIRALAX / GLYCOLAX) packet 17 g  17 g Oral Daily PRN Opyd, Ilene Qua, MD      . polyvinyl alcohol (LIQUIFILM TEARS) 1.4 % ophthalmic solution 1 drop  1 drop Both Eyes PRN Opyd, Ilene Qua, MD      . Derrill Memo ON 04/06/2017] sevelamer carbonate (RENVELA) tablet 2,400 mg  2,400 mg Oral TID WC Opyd, Ilene Qua, MD      .  sodium chloride flush (NS) 0.9 % injection 3 mL  3 mL Intravenous Q12H Opyd, Timothy S, MD      . sodium chloride flush (NS) 0.9 % injection 3 mL  3 mL Intravenous Q12H Opyd, Ilene Qua, MD      . sodium chloride flush (NS) 0.9 % injection 3 mL  3 mL Intravenous PRN Opyd, Ilene Qua, MD      . traMADol (ULTRAM) tablet 50-100 mg  50-100 mg Oral Q6H PRN Opyd, Ilene Qua, MD        REVIEW OF SYSTEMS:   [X]  denotes positive finding, [ ]  denotes negative finding Cardiac  Comments:  Chest pain or chest pressure:    Shortness of breath upon exertion:    Short of breath when lying flat:    Irregular heart rhythm:    Constitutional    Fever or chills:      PHYSICAL EXAM:   Vitals:   04/05/17 2035 04/05/17 2040 04/05/17 2056 04/05/17 2131  BP: (!) 221/104 (!) 204/83 (!) 193/95 (!) 204/100  Pulse: 77 86    Resp:      Temp: 98.6 F (37 C)     TempSrc: Oral     SpO2: 100%     Weight: 172 lb 9.9 oz (78.3 kg)     Height: 5\' 6"  (1.676 m)       GENERAL: The patient is a well-nourished female, in no acute distress. The vital signs are documented above. CARDIOVASCULAR: There is a  regular rate and rhythm. PULMONARY: Non-labored respirations   STUDIES:   Repeat K was 5.7   MEDICAL ISSUES:   Will plan for dialysis catheter on Wednesday March 27.  NPO after midnight  Annamarie Major, MD [facility] [phone] Pager [phone]

## 2017-04-05 NOTE — H&P (Signed)
Brief History and Physical  History of Present Illness   SHAWNAY Wood is a 84 y.o. female who presents with chief complaint: left arm access infilitration.  The patient presents today for tunneled dialysis catheter placement.    Past Medical History:  Diagnosis Date  . Anemia   . Anemia in CKD (chronic kidney disease)   . Anxiety   . Arthritis    gout, right shoulder  . Cancer [facility])    ?stomach cancer   . Cataracts, both eyes   . CHF (congestive heart failure) ([place])   . Depression   . Diabetes mellitus, type 2 (HCC)    Type II - no medication  . Dyspnea   . ESRD (end stage renal disease) ([place])    [facility]  . GERD (gastroesophageal reflux disease)   . Gynecologic cancer   . HOH (hard of hearing)   . Hyperlipidemia   . Hypertension   . Hypothyroidism   . IBS (irritable bowel syndrome)    with costipation  . Intraoperative hemorrhage and hematoma of a digestive system organ or structure complicating a digestive system procedure   . Normal cardiac stress test 05/2015  . Overactive bladder   . Pneumonia    2 times in 2016  . SOB (shortness of breath)    nml spirometry 09/05/06  . Stroke [facility])    residual right side weakness and pain  . SVT (supraventricular tachycardia) ([place])   . Syncope and collapse   . Thyroid disease     Past Surgical History:  Procedure Laterality Date  . ABDOMINAL HYSTERECTOMY    . AV FISTULA PLACEMENT, BRACHIOCEPHALIC  31/51/7616   left arm   by Dr. Bridgett Larsson  . COLONOSCOPY    . EYE SURGERY Bilateral    Cataract  . FISTULA SUPERFICIALIZATION Left 06/10/2015   Procedure: LEFT UPPER ARM BRACHIOCEPHALIC FISTULA PSEUDOANEURYSM PLICATION;  Surgeon: Conrad Morehouse, MD;  Location: [place];  Service: Vascular;  Laterality: Left;  . IR THROMBECTOMY AV FISTULA W/THROMBOLYSIS/PTA INC/SHUNT/IMG LEFT Left 05/06/2016  . IR US GUIDE VASC ACCESS LEFT  05/06/2016  . PERIPHERAL VASCULAR CATHETERIZATION Left 05/15/2015   Procedure: Fistulagram;   Surgeon: Conrad [place], MD;  Location: [place] CV LAB;  Service: Cardiovascular;  Laterality: Left;  . TEE WITHOUT CARDIOVERSION  2011   2/2 Strep Bovis infection  . VESICOVAGINAL FISTULA CLOSURE W/ TAH      Social History   Socioeconomic History  . Marital status: Widowed    Spouse name: Not on file  . Number of children: Not on file  . Years of education: Not on file  . Highest education level: Not on file  Occupational History  . Not on file  Social Needs  . Financial resource strain: Not on file  . Food insecurity:    Worry: Not on file    Inability: Not on file  . Transportation needs:    Medical: Not on file    Non-medical: Not on file  Tobacco Use  . Smoking status: Never Smoker  . Smokeless tobacco: Never Used  Substance and Sexual Activity  . Alcohol use: No    Alcohol/week: 0.0 oz  . Drug use: No  . Sexual activity: Never  Lifestyle  . Physical activity:    Days per week: Not on file    Minutes per session: Not on file  . Stress: Not on file  Relationships  . Social connections:    Talks on  phone: Not on file    Gets together: Not on file    Attends religious service: Not on file    Active member of club or organization: Not on file    Attends meetings of clubs or organizations: Not on file    Relationship status: Not on file  . Intimate partner violence:    Fear of current or ex partner: Not on file    Emotionally abused: Not on file    Physically abused: Not on file    Forced sexual activity: Not on file  Other Topics Concern  . Not on file  Social History Narrative   The patient is one of 8 children and she does not know much of the specifics of her family medical history. She has 10 children and so many grandchildren and great grandchildren that she can't count. She has been on dialysis and living at an assisted living center for the past 6 years.     Family History  Problem Relation Age of Onset  . Stroke Father   . Hypertension Sister   .  Heart failure Sister   . Cancer Brother     Current Facility-Administered Medications  Medication Dose Route Frequency Provider Last Rate Last Dose  . 0.9 %  sodium chloride infusion   Intravenous Continuous Barnet Glasgow, MD 10 mL/hr at 04/05/17 1254 10 mL/hr at 04/05/17 1254    No Known Allergies  Review of Systems: As listed above, otherwise negative.   Physical Examination   Vitals:   04/05/17 1221  BP: (!) 199/77  Pulse: 68  Resp: 18  Temp: 98.6 F (37 C)  SpO2: 100%  Weight: 172 lb (78 kg)  Height: 5\' 6"  (1.676 m)   Body mass index is 27.76 kg/m.  General Alert, O x 3, WD, NAD  Pulmonary Sym exp, good B air movt, CTA B  Cardiac RRR, Nl S1, S2, no Murmurs, No rubs, No S3,S4  Musculo- skeletal L arm access with extensive hematoma with +bruit  Neurologic Pain and light touch intact in extremities ,     Laboratory  See [name] is a 84 y.o. female who presents with: infilitrated left arm access, end stage renal disease requiring hemodialysis.   The patient is scheduled for: tunneled dialysis catheter placement The patient is aware the risks of tunneled dialysis catheter placement include but are not limited to: bleeding, infection, central venous injury, pneumothorax, possible venous stenosis, possible malpositioning in the venous system, and possible infections related to long-term catheter presence.   The patient is aware of the risks and agrees to proceed.   Adele Barthel, MD, FACS [facility] [place] Office: [phone] Pager: [phone]  04/05/2017, 1:24 PM

## 2017-04-05 NOTE — Anesthesia Preprocedure Evaluation (Addendum)
Anesthesia Evaluation  Patient identified by MRN, date of birth, ID band Patient awake    Reviewed: Allergy & Precautions, NPO status , Patient's Chart, lab work & pertinent test results  Airway Mallampati: II  TM Distance: >3 FB Neck ROM: Full    Dental   Pulmonary shortness of breath, COPD,    breath sounds clear to auscultation       Cardiovascular Exercise Tolerance: Poor hypertension, +CHF   Rhythm:Regular Rate:Normal     Neuro/Psych    GI/Hepatic hiatal hernia,   Endo/Other  diabetes  Renal/GU Renal disease     Musculoskeletal   Abdominal   Peds  Hematology  (+) anemia ,   Anesthesia Other Findings   Reproductive/Obstetrics                            Anesthesia Physical Anesthesia Plan  ASA: III  Anesthesia Plan: MAC   Post-op Pain Management:    Induction: Intravenous  PONV Risk Score and Plan:   Airway Management Planned: Natural Airway and Simple Face Mask  Additional Equipment:   Intra-op Plan:   Post-operative Plan:   Informed Consent: I have reviewed the patients History and Physical, chart, labs and discussed the procedure including the risks, benefits and alternatives for the proposed anesthesia with the patient or authorized representative who has indicated his/her understanding and acceptance.     Plan Discussed with: CRNA and Anesthesiologist  Anesthesia Plan Comments:         Anesthesia Quick Evaluation

## 2017-04-05 NOTE — Progress Notes (Addendum)
CRITICAL VALUE ALERT  Critical Value: Potassium 6.5 Late entry value actually was 6.6 non hemolyzed  Date & Time Notied:  04/05/2017 1424  Provider Notified: Valma Cava  Orders Received/Actions taken: Albuterol 2.5 mg, patient has been on monitor since initial potassium on I- Stat

## 2017-04-05 NOTE — H&P (Signed)
History and Physical    [name] DJM:[id number] DOB: 08/04/1925 DOA: 04/05/2017  PCP: Alycia Rossetti, MD   Patient coming from: Nursing home   Chief Complaint: Dialysis access problem, hyperkalemia   HPI: [name] is a 84 y.o. female with medical history significant for hypertension, diabetes mellitus now diet-controlled, hypothyroidism, COPD with chronic hypoxic respiratory failure, depression with anxiety,  and ESRD on HD, admitted for hyperkalemia, hypovolemia, and hypertensive urgency in the setting of ESRD status problem and missed dialysis.  Patient reports completing her dialysis on 04/01/2017, but has since been unable to dialyze secondary to a problem with her left upper extremity access.  She presented today for placement of a dialysis catheter with vascular surgery, but preoperative blood work revealed potassium of 6.6 and the procedure was canceled.  She was treated with 30 g of Kayexalate and albuterol neb.  EKG was reassuring.  Nephrology was contacted, will plan to dialyze patient tomorrow after the catheter was placed, and a patient was requested.  Patient remains hypertensive, slightly dyspneic but in no acute distress on her supplemental oxygen, and she will be admitted to the telemetry unit for ongoing evaluation and management of hyperkalemia, hypertensive urgency, and anemia in setting of missed dialysis due to access problems.  Review of Systems:  All other systems reviewed and apart from HPI, are negative.  Past Medical History:  Diagnosis Date  . Anemia   . Anemia in CKD (chronic kidney disease)   . Anxiety   . Arthritis    gout, right shoulder  . Cancer [facility])    ?stomach cancer   . Cataracts, both eyes   . CHF (congestive heart failure) ([place])   . Depression   . Diabetes mellitus, type 2 (HCC)    Type II - no medication  . Dyspnea   . ESRD (end stage renal disease) ([place])    [facility]  . GERD (gastroesophageal reflux disease)     . Gynecologic cancer   . HOH (hard of hearing)   . Hyperlipidemia   . Hypertension   . Hypothyroidism   . IBS (irritable bowel syndrome)    with costipation  . Intraoperative hemorrhage and hematoma of a digestive system organ or structure complicating a digestive system procedure   . Normal cardiac stress test 05/2015  . Overactive bladder   . Pneumonia    2 times in 2016  . SOB (shortness of breath)    nml spirometry 09/05/06  . Stroke [facility])    residual right side weakness and pain  . SVT (supraventricular tachycardia) ([place])   . Syncope and collapse   . Thyroid disease     Past Surgical History:  Procedure Laterality Date  . ABDOMINAL HYSTERECTOMY    . AV FISTULA PLACEMENT, BRACHIOCEPHALIC  22/29/7989   left arm   by Dr. Bridgett Larsson  . COLONOSCOPY    . EYE SURGERY Bilateral    Cataract  . FISTULA SUPERFICIALIZATION Left 06/10/2015   Procedure: LEFT UPPER ARM BRACHIOCEPHALIC FISTULA PSEUDOANEURYSM PLICATION;  Surgeon: Conrad Edgemoor, MD;  Location: [place];  Service: Vascular;  Laterality: Left;  . IR THROMBECTOMY AV FISTULA W/THROMBOLYSIS/PTA INC/SHUNT/IMG LEFT Left 05/06/2016  . IR US GUIDE VASC ACCESS LEFT  05/06/2016  . PERIPHERAL VASCULAR CATHETERIZATION Left 05/15/2015   Procedure: Fistulagram;  Surgeon: Conrad Matinecock, MD;  Location: [facility];  Service: Cardiovascular;  Laterality: Left;  . TEE WITHOUT CARDIOVERSION  2011   2/2 Strep Bovis  infection  . VESICOVAGINAL FISTULA CLOSURE W/ TAH       reports that she has never smoked. She has never used smokeless tobacco. She reports that she does not drink alcohol or use drugs.  No Known Allergies  Family History  Problem Relation Age of Onset  . Stroke Father   . Hypertension Sister   . Heart failure Sister   . Cancer Brother      Prior to Admission medications   Medication Sig Start Date End Date Taking? Authorizing Provider  citalopram (CELEXA) 20 MG tablet TAKE (1) TABLET BY MOUTH ONCE IN THE MORNING. Patient  taking differently: TAKE 20 MG BY MOUTH ONCE IN THE MORNING. 02/28/17  Yes [place], Modena Nunnery, MD  diltiazem (CARDIZEM CD) 180 MG 24 hr capsule TAKE 1 CAPSULE BY MOUTH ONCE A DAY. Patient taking differently: TAKE 180 MG BY MOUTH ONCE A DAY. 06/25/16  Yes [place], Modena Nunnery, MD  docusate sodium (COLACE) 100 MG capsule TAKE 1 CAPSULE BY MOUTH TWICE DAILY. Patient taking differently: TAKE 100 MG BY MOUTH TWICE DAILY. 02/28/17  Yes [place], Modena Nunnery, MD  isosorbide mononitrate (IMDUR) 60 MG 24 hr tablet TAKE (1) TABLET BY MOUTH ONCE DAILY. Patient taking differently: TAKE 60 MG BY MOUTH ONCE DAILY. 03/24/16  Yes [place], Modena Nunnery, MD  levothyroxine (SYNTHROID, LEVOTHROID) 25 MCG tablet TAKE (1) TABLET BY MOUTH ONCE DAILY BEFORE BREAKFAST. Patient taking differently: TAKE 25 MCG BY MOUTH ONCE DAILY BEFORE BREAKFAST. 02/28/17  Yes [place], Modena Nunnery, MD  mirtazapine (REMERON) 15 MG tablet TAKE 1/2 TABLET (7.5MG ) BY MOUTH AT BEDTIME FOR MOOD/APPETITE. 02/28/17  Yes [place], Modena Nunnery, MD  pantoprazole (PROTONIX) 40 MG tablet TAKE (1) TABLET BY MOUTH ONCE DAILY. Patient taking differently: TAKE 40 MG BY MOUTH ONCE DAILY. 07/26/16  Yes [place], Modena Nunnery, MD  sevelamer (RENVELA) 800 MG tablet Take 800-2,400 mg by mouth See admin instructions. Take 2400 mg by mouth three times daily with meals and take 800 mg by mouth once daily with snacks   Yes [provider]  albuterol (PROVENTIL) (2.5 MG/3ML) 0.083% nebulizer solution USE 1 VIAL IN NEBULIZER 3 TIMES DAILY AS NEEDED FOR WHEEZING/SHORTNESS OF BREATH. 10/01/15   [place], Modena Nunnery, MD  ARTIFICIAL TEARS 1.4 % ophthalmic solution INSTILL 1 DROP INTO EITHER EYE UP TO 6 TIMES DAILY AS NEEDED FOR LUBRICATION 08/18/16   Alycia Rossetti, MD  aspirin EC 81 MG tablet TAKE (1) TABLET BY MOUTH ONCE DAILY. Patient taking differently: TAKE 81 MG BY MOUTH ONCE DAILY. 02/28/17   [place], Modena Nunnery, MD  guaiFENesin-dextromethorphan (ROBITUSSIN DM) 100-10 MG/5ML syrup TAKE ONE  TEASPOONFUL BY MOUTH EVERY 4 HOURS AS NEEDED FOR COUGH 02/17/16   Vic Blackbird F, MD  ipratropium (ATROVENT) 0.02 % nebulizer solution USE 1 VIAL IN NEBULIZER 3 TIMES DAILY AS NEEDED FOR WHEEZING/SHORTNESS OF BREATH. 10/01/15   Alycia Rossetti, MD  lidocaine-prilocaine (EMLA) cream APPLY TOPICALLY TO LEFT UPPER ARM 1 HOUR PRIOR TO DIALYSIS ON MONDAY,WEDNESDAY & FRIDAYS AS NEEDED FOR PAIN. WRAP WITH PLASTIC WRAP AFTER A 03/23/16   Alycia Rossetti, MD  Multiple Vitamin (TAB-A-VITE PO) Take 1 tablet by mouth daily.    [provider]  nitroGLYCERIN (NITROSTAT) 0.4 MG SL tablet DISSOLVE 1 TABLET UNDER THE TONGUE EVERY 5 MINUTES X2 DOSES THEN TO ER 08/18/16   Alycia Rossetti, MD  Nutritional Supplements (FEEDING SUPPLEMENT, NEPRO CARB STEADY,) LIQD Take 237 mLs by mouth 3 (three) times daily as needed (Supplement). 12/31/16  [place]hung[place], Nishant, MD  nystatin (MYCOSTATIN/NYSTOP) powder Apply 4 (four) times daily as needed topically. To groin/ abd folds for irritation 11/18/16   Alycia Rossetti, MD  OXYGEN Inhale 2 L into the lungs daily as needed (shortness of breath).    [provider]  polyethylene glycol powder (MIRALAX) powder Take 17 g by mouth as needed for mild constipation. Hold for diarrhea Patient taking differently: Take 17 g by mouth daily.  08/14/16   Rai, Ripudeep K, MD  SENSIPAR 30 MG tablet TAKE 1 TABLET BY MOUTH ONCE DAILY AFTER THE EVENING MEAL *PROVID[facility]* Patient taking differently: TAKE 30 MG BY MOUTH ONCE DAILY AFTER THE EVENING MEAL *PROVIDED BY [facility]* 03/24/17   [place], Modena Nunnery, MD  traMADol (ULTRAM) 50 MG tablet Take 50 mg by mouth every 12 (twelve) hours as needed for moderate pain.    [provider]  VENTOLIN HFA 108 (90 Base) MCG/ACT inhaler INHALE (2) PUFFS EVERY 4 HOURS AS NEEDED FOR WHEEZING OR SHORTNESS OFBREATH(COUGH). 03/25/16   Alycia Rossetti, MD    Physical Exam: Vitals:   04/05/17 2032 04/05/17 2035 04/05/17  2040 04/05/17 2056  BP:  (!) 221/104 (!) 204/83 (!) 193/95  Pulse:  77 86   Resp: 20     Temp:  98.6 F (37 C)    TempSrc:  Oral    SpO2:  100%    Weight:  78.3 kg (172 lb 9.9 oz)    Height:  5\' 6"  (1.676 m)        Constitutional: NAD, calm  Eyes: PERTLA, lids and conjunctivae normal ENMT: Mucous membranes are moist. Posterior pharynx clear of any exudate or lesions.   Neck: normal, supple, no masses, no thyromegaly Respiratory: Mild dyspnea with speech. Normal respiratory effort. No accessory muscle use.  Cardiovascular: S1 & S2 heard, regular rate and rhythm. 1+ edema to bilateral LE's.  Abdomen: No distension, no tenderness, soft. Bowel sounds normal.  Musculoskeletal: no clubbing / cyanosis. No joint deformity upper and lower extremities.   Skin: no significant rashes, lesions, ulcers. Warm, dry, well-perfused. Neurologic: CN 2-12 grossly intact. Sensation intact Strength 5/5 in all 4 limbs.  Psychiatric: Alert and oriented x 3. Very pleasant, cooperative.     Labs on Admission: I have personally reviewed following labs and imaging studies  CBC: Recent Labs  Lab 04/05/17 1301 04/05/17 1803  WBC  --  6.0  HGB 11.2* 9.7*  HCT 33.0* 30.5*  MCV  --  79.4  PLT  --  595   Basic Metabolic Panel: Recent Labs  Lab 04/05/17 1301 04/05/17 1308 04/05/17 1825 04/05/17 1830  NA 136  --  138 139  K 6.3* 6.6* 5.6* 5.7*  CL  --   --  96* 97*  CO2  --   --  24 26  GLUCOSE 116*  --  195* 192*  BUN  --   --  81* 81*  CREATININE  --   --  11.03* 10.93*  CALCIUM  --   --  8.6* 8.6*  PHOS  --   --   --  4.9*   GFR: Estimated Creatinine Clearance: 3.7 mL/min (A) (by C-G formula based on SCr of 10.93 mg/dL (H)). Liver Function Tests: Recent Labs  Lab 04/05/17 1830  ALBUMIN 3.5   No results for input(s): LIPASE, AMYLASE in the last 168 hours. No results for input(s): AMMONIA in the last 168 hours. Coagulation Profile: Recent Labs  Lab 04/05/17 1803  INR 1.19  Cardiac Enzymes: No results for input(s): CKTOTAL, CKMB, CKMBINDEX, TROPONINI in the last 168 hours. BNP (last 3 results) No results for input(s): PROBNP in the last 8760 hours. HbA1C: No results for input(s): HGBA1C in the last 72 hours. CBG: No results for input(s): GLUCAP in the last 168 hours. Lipid Profile: No results for input(s): CHOL, HDL, LDLCALC, TRIG, CHOLHDL, LDLDIRECT in the last 72 hours. Thyroid Function Tests: No results for input(s): TSH, T4TOTAL, FREET4, T3FREE, THYROIDAB in the last 72 hours. Anemia Panel: No results for input(s): VITAMINB12, FOLATE, FERRITIN, TIBC, IRON, RETICCTPCT in the last 72 hours. Urine analysis:    Component Value Date/Time   COLORURINE YELLOW 01/31/2011 2039   APPEARANCEUR CLEAR 01/31/2011 2039   LABSPEC 1.010 01/31/2011 2039   PHURINE 6.5 01/31/2011 2039   GLUCOSEU NEGATIVE 01/31/2011 2039   HGBUR NEGATIVE 01/31/2011 2039   HGBUR trace-lysed 09/05/2006 0000   BILIRUBINUR NEGATIVE 01/31/2011 2039   [place] NEGATIVE 01/31/2011 2039   PROTEINUR 30 (A) 01/31/2011 2039   UROBILINOGEN 0.2 01/31/2011 2039   NITRITE NEGATIVE 01/31/2011 2039   LEUKOCYTESUR NEGATIVE 01/31/2011 2039   Sepsis Labs: @LABRCNTIP (procalcitonin:4,lacticidven:4) ) Recent Results (from the past 240 hour(s))  Surgical pcr screen     Status: None   Collection Time: 04/05/17  1:09 PM  Result Value Ref Range Status   MRSA, PCR NEGATIVE NEGATIVE Final   Staphylococcus aureus NEGATIVE NEGATIVE Final    Comment: (NOTE) The Xpert SA Assay (FDA approved for NASAL specimens in patients 6 years of age and older), is one component of a comprehensive surveillance program. It is not intended to diagnose infection nor to guide or monitor treatment. Performed at [facility], [place] [address], [place], [place] [address]      Radiological Exams on Admission: No results found.  EKG: Independently reviewed. Sinus rhythm, non-specific T-wave abnormality.    Assessment/Plan   1. ESRD with access problem  - Patient typically dialyzes MWF, reports completing session on 04/01/17, but unable to dialyze since d/t problem with her access in LUE - She presented today for [facility] placement, but procedure cancelled due to critical hyperkalemia  - She has been treated with temporizing measures for hyperkalemia  - Nephrology and vascular surgery consulting  - Continue cardiac monitoring, SLIV, follow serial potassium measurements, treat hypertension as below    2. Hyperkalemia  - Potassium was 6.6 today when she presented for [facility] placement  - EKG without significant changes  - Serum potassium is 5.6 after treatment with 30 g kayexalate and albuterol neb   - Continue cardiac monitoring, follow serial potassium levels until normalizes  - Nephrology planning for HD tomorrow    3. Hypertension with hypertensive urgency  - BP 220/100 on admission  - Secondary to hypervolemia and anticipate improvement with HD  - There is difficulty obtaining IV access on admission and IV team consulted; will give a dose of oral clonidine now and then use hydralazine IVP's prn once IV access achieved  - Continue diltiazem   4. COPD; chronic hypoxic respiratory failure  - No wheezing of obstructive breathing  - Mild dyspnea with speech likely related to hypervolemia  - Continue prn albuterol, supplemental O2    5. Hypothyroidism  - Continue Synthroid    6. Depression with anxiety  - Stable   - Continue Celexa and Remeron    7. Anemia  - Hgb is 9.7 on admission, similar to priors  - No bleeding, likely secondary to CKD    8. Hx  of DM; hyperglycemia  - Has documented hx of DM, but most recent A1c was 5.3% and she is no longer on diabetes medications  - Serum glucose 200 on admission  - Check CBG's and use a low-intensity SSI as needed    DVT prophylaxis: SCD's  Code Status: DNR  Family Communication: Discussed with patient Consults called: Vascular surgery,  nephrology Admission status: Inpatient    Vianne Bulls, MD [facility] Pager [phone]  If 7PM-7AM, please contact night-coverage [email or web address] Password [facility]  04/05/2017, 9:19 PM

## 2017-04-05 NOTE — H&P (View-Only) (Signed)
Patient name: [name] MRN: [id number] DOB: Oct 01, 1925 Sex: female    HISTORY OF PRESENT ILLNESS:   [name] is a 84 y.o. female who came to [facility] today for initiation of dialysis with plans for a catheter.  This had to be cancelled due to hyperkalemia, which was treated medically.  She was admitted by the hospitalists.   CURRENT MEDICATIONS:    Current Facility-Administered Medications  Medication Dose Route Frequency Provider Last Rate Last Dose  . 0.9 %  sodium chloride infusion  250 mL Intravenous PRN Opyd, Ilene Qua, MD      . acetaminophen (TYLENOL) tablet 650 mg  650 mg Oral Q6H PRN Opyd, Ilene Qua, MD       Or  . acetaminophen (TYLENOL) suppository 650 mg  650 mg Rectal Q6H PRN Opyd, Ilene Qua, MD      . albuterol (PROVENTIL) (2.5 MG/3ML) 0.083% nebulizer solution 2.5 mg  2.5 mg Nebulization Q4H PRN Opyd, Ilene Qua, MD      . Derrill Memo ON 04/07/2017] aspirin EC tablet 81 mg  81 mg Oral Daily Opyd, Ilene Qua, MD      . Derrill Memo ON 04/06/2017] cinacalcet (SENSIPAR) tablet 30 mg  30 mg Oral q1800 Opyd, Ilene Qua, MD      . Derrill Memo ON 04/06/2017] citalopram (CELEXA) tablet 20 mg  20 mg Oral Daily Opyd, Ilene Qua, MD      . Derrill Memo ON 04/06/2017] diltiazem (CARDIZEM CD) 24 hr capsule 180 mg  180 mg Oral Daily Opyd, Ilene Qua, MD      . Derrill Memo ON 04/06/2017] docusate sodium (COLACE) capsule 100 mg  100 mg Oral BID Opyd, Ilene Qua, MD      . feeding supplement (NEPRO CARB STEADY) liquid 237 mL  237 mL Oral TID PRN Opyd, Ilene Qua, MD      . guaiFENesin-dextromethorphan (ROBITUSSIN DM) 100-10 MG/5ML syrup 5 mL  5 mL Oral Q6H PRN Opyd, Ilene Qua, MD      . hydrALAZINE (APRESOLINE) injection 10 mg  10 mg Intravenous Q4H PRN Opyd, Ilene Qua, MD      . insulin aspart (novoLOG) injection 0-9 Units  0-9 Units Subcutaneous Q4H Opyd, Ilene Qua, MD      . Derrill Memo ON 04/06/2017] isosorbide mononitrate (IMDUR) 24 hr tablet 60 mg  60 mg Oral Daily Opyd, Ilene Qua, MD       . Derrill Memo ON 04/06/2017] levothyroxine (SYNTHROID, LEVOTHROID) tablet 25 mcg  25 mcg Oral QAC breakfast Opyd, Ilene Qua, MD      . mirtazapine (REMERON) tablet 7.5 mg  7.5 mg Oral QHS Opyd, Ilene Qua, MD      . ondansetron (ZOFRAN) tablet 4 mg  4 mg Oral Q6H PRN Opyd, Ilene Qua, MD       Or  . ondansetron (ZOFRAN) injection 4 mg  4 mg Intravenous Q6H PRN Opyd, Ilene Qua, MD      . Derrill Memo ON 04/06/2017] pantoprazole (PROTONIX) EC tablet 40 mg  40 mg Oral Daily Opyd, Ilene Qua, MD      . polyethylene glycol (MIRALAX / GLYCOLAX) packet 17 g  17 g Oral Daily PRN Opyd, Ilene Qua, MD      . polyvinyl alcohol (LIQUIFILM TEARS) 1.4 % ophthalmic solution 1 drop  1 drop Both Eyes PRN Opyd, Ilene Qua, MD      . Derrill Memo ON 04/06/2017] sevelamer carbonate (RENVELA) tablet 2,400 mg  2,400 mg Oral TID WC Opyd, Ilene Qua, MD      .  sodium chloride flush (NS) 0.9 % injection 3 mL  3 mL Intravenous Q12H Opyd, Timothy S, MD      . sodium chloride flush (NS) 0.9 % injection 3 mL  3 mL Intravenous Q12H Opyd, Ilene Qua, MD      . sodium chloride flush (NS) 0.9 % injection 3 mL  3 mL Intravenous PRN Opyd, Ilene Qua, MD      . traMADol (ULTRAM) tablet 50-100 mg  50-100 mg Oral Q6H PRN Opyd, Ilene Qua, MD        REVIEW OF SYSTEMS:   [X]  denotes positive finding, [ ]  denotes negative finding Cardiac  Comments:  Chest pain or chest pressure:    Shortness of breath upon exertion:    Short of breath when lying flat:    Irregular heart rhythm:    Constitutional    Fever or chills:      PHYSICAL EXAM:   Vitals:   04/05/17 2035 04/05/17 2040 04/05/17 2056 04/05/17 2131  BP: (!) 221/104 (!) 204/83 (!) 193/95 (!) 204/100  Pulse: 77 86    Resp:      Temp: 98.6 F (37 C)     TempSrc: Oral     SpO2: 100%     Weight: 172 lb 9.9 oz (78.3 kg)     Height: 5\' 6"  (1.676 m)       GENERAL: The patient is a well-nourished female, in no acute distress. The vital signs are documented above. CARDIOVASCULAR: There is a  regular rate and rhythm. PULMONARY: Non-labored respirations   STUDIES:   Repeat K was 5.7   MEDICAL ISSUES:   Will plan for dialysis catheter on Wednesday March 27.  NPO after midnight  Annamarie Major, MD [facility] [phone] Pager [phone]

## 2017-04-06 ENCOUNTER — Inpatient Hospital Stay (HOSPITAL_COMMUNITY): Payer: Medicare HMO

## 2017-04-06 ENCOUNTER — Inpatient Hospital Stay (HOSPITAL_COMMUNITY): Payer: Medicare HMO | Admitting: Certified Registered Nurse Anesthetist

## 2017-04-06 ENCOUNTER — Encounter (HOSPITAL_COMMUNITY): Payer: Self-pay | Admitting: *Deleted

## 2017-04-06 ENCOUNTER — Encounter (HOSPITAL_COMMUNITY)
Admission: AD | Disposition: A | Discharge status: Other Acute Inpatient Hospital | Payer: Self-pay | Source: Ambulatory Visit | Attending: Internal Medicine

## 2017-04-06 DIAGNOSIS — T82898A Other specified complication of vascular prosthetic devices, implants and grafts, initial encounter: Secondary | ICD-10-CM | POA: Diagnosis not present

## 2017-04-06 DIAGNOSIS — I160 Hypertensive urgency: Secondary | ICD-10-CM | POA: Diagnosis not present

## 2017-04-06 DIAGNOSIS — I120 Hypertensive chronic kidney disease with stage 5 chronic kidney disease or end stage renal disease: Secondary | ICD-10-CM | POA: Diagnosis not present

## 2017-04-06 DIAGNOSIS — I509 Heart failure, unspecified: Secondary | ICD-10-CM | POA: Diagnosis not present

## 2017-04-06 DIAGNOSIS — N186 End stage renal disease: Secondary | ICD-10-CM

## 2017-04-06 DIAGNOSIS — I132 Hypertensive heart and chronic kidney disease with heart failure and with stage 5 chronic kidney disease, or end stage renal disease: Secondary | ICD-10-CM | POA: Diagnosis not present

## 2017-04-06 DIAGNOSIS — E1122 Type 2 diabetes mellitus with diabetic chronic kidney disease: Secondary | ICD-10-CM | POA: Diagnosis not present

## 2017-04-06 DIAGNOSIS — N185 Chronic kidney disease, stage 5: Secondary | ICD-10-CM | POA: Diagnosis not present

## 2017-04-06 DIAGNOSIS — E861 Hypovolemia: Secondary | ICD-10-CM | POA: Diagnosis not present

## 2017-04-06 DIAGNOSIS — Z452 Encounter for adjustment and management of vascular access device: Secondary | ICD-10-CM | POA: Diagnosis not present

## 2017-04-06 DIAGNOSIS — D631 Anemia in chronic kidney disease: Secondary | ICD-10-CM | POA: Diagnosis not present

## 2017-04-06 DIAGNOSIS — Z992 Dependence on renal dialysis: Secondary | ICD-10-CM

## 2017-04-06 DIAGNOSIS — E875 Hyperkalemia: Secondary | ICD-10-CM | POA: Diagnosis not present

## 2017-04-06 DIAGNOSIS — J90 Pleural effusion, not elsewhere classified: Secondary | ICD-10-CM | POA: Diagnosis not present

## 2017-04-06 DIAGNOSIS — J449 Chronic obstructive pulmonary disease, unspecified: Secondary | ICD-10-CM | POA: Diagnosis not present

## 2017-04-06 HISTORY — PX: INSERTION OF DIALYSIS CATHETER: SHX1324

## 2017-04-06 LAB — POCT I-STAT 4, (NA,K, GLUC, HGB,HCT)
Glucose, Bld: 82 mg/dL (ref 65–99)
Glucose, Bld: 84 mg/dL (ref 65–99)
HCT: 29 % — ABNORMAL LOW (ref 36.0–46.0)
HEMATOCRIT: 29 % — AB (ref 36.0–46.0)
Hemoglobin: 9.9 g/dL — ABNORMAL LOW (ref 12.0–15.0)
Hemoglobin: 9.9 g/dL — ABNORMAL LOW (ref 12.0–15.0)
POTASSIUM: 5.9 mmol/L — AB (ref 3.5–5.1)
Potassium: 6 mmol/L — ABNORMAL HIGH (ref 3.5–5.1)
SODIUM: 137 mmol/L (ref 135–145)
Sodium: 134 mmol/L — ABNORMAL LOW (ref 135–145)

## 2017-04-06 LAB — CBC
HEMATOCRIT: 29.1 % — AB (ref 36.0–46.0)
HEMOGLOBIN: 9.5 g/dL — AB (ref 12.0–15.0)
MCH: 25.9 pg — ABNORMAL LOW (ref 26.0–34.0)
MCHC: 32.6 g/dL (ref 30.0–36.0)
MCV: 79.3 fL (ref 78.0–100.0)
Platelets: 138 10*3/uL — ABNORMAL LOW (ref 150–400)
RBC: 3.67 MIL/uL — AB (ref 3.87–5.11)
RDW: 15 % (ref 11.5–15.5)
WBC: 7 10*3/uL (ref 4.0–10.5)

## 2017-04-06 LAB — GLUCOSE, CAPILLARY
GLUCOSE-CAPILLARY: 149 mg/dL — AB (ref 65–99)
GLUCOSE-CAPILLARY: 155 mg/dL — AB (ref 65–99)
GLUCOSE-CAPILLARY: 177 mg/dL — AB (ref 65–99)
GLUCOSE-CAPILLARY: 95 mg/dL (ref 65–99)
Glucose-Capillary: 76 mg/dL (ref 65–99)
Glucose-Capillary: 78 mg/dL (ref 65–99)
Glucose-Capillary: 106 mg/dL — ABNORMAL HIGH (ref 65–99)

## 2017-04-06 LAB — BASIC METABOLIC PANEL
ANION GAP: 15 (ref 5–15)
BUN: 87 mg/dL — ABNORMAL HIGH (ref 6–20)
CALCIUM: 8.7 mg/dL — AB (ref 8.9–10.3)
CO2: 28 mmol/L (ref 22–32)
Chloride: 97 mmol/L — ABNORMAL LOW (ref 101–111)
Creatinine, Ser: 11.56 mg/dL — ABNORMAL HIGH (ref 0.44–1.00)
GFR, EST AFRICAN AMERICAN: 3 mL/min — AB (ref >60)
GFR, EST NON AFRICAN AMERICAN: 3 mL/min — AB (ref >60)
Glucose, Bld: 76 mg/dL (ref 65–99)
Potassium: 6.1 mmol/L — ABNORMAL HIGH (ref 3.5–5.1)
SODIUM: 140 mmol/L (ref 135–145)

## 2017-04-06 LAB — POTASSIUM
POTASSIUM: 5.1 mmol/L (ref 3.5–5.1)
POTASSIUM: 5.6 mmol/L — AB (ref 3.5–5.1)
POTASSIUM: 3.8 mmol/L (ref 3.5–5.1)

## 2017-04-06 SURGERY — INSERTION OF DIALYSIS CATHETER
Anesthesia: Monitor Anesthesia Care | Site: Neck

## 2017-04-06 MED ORDER — ALBUTEROL SULFATE HFA 108 (90 BASE) MCG/ACT IN AERS
INHALATION_SPRAY | RESPIRATORY_TRACT | Status: AC
  Filled 2017-04-06: qty 6.7

## 2017-04-06 MED ORDER — ONDANSETRON HCL 4 MG/2ML IJ SOLN
INTRAMUSCULAR | Status: DC | PRN
  Administered 2017-04-06: 4 mg via INTRAVENOUS

## 2017-04-06 MED ORDER — HEPARIN SODIUM (PORCINE) 1000 UNIT/ML IJ SOLN
INTRAMUSCULAR | Status: DC | PRN
  Administered 2017-04-06: 1000 [IU]

## 2017-04-06 MED ORDER — ONDANSETRON HCL 4 MG/2ML IJ SOLN
INTRAMUSCULAR | Status: AC
  Filled 2017-04-06: qty 2

## 2017-04-06 MED ORDER — PROPOFOL 10 MG/ML IV BOLUS
INTRAVENOUS | Status: DC | PRN
  Administered 2017-04-06: 20 mg via INTRAVENOUS

## 2017-04-06 MED ORDER — CEFAZOLIN SODIUM-DEXTROSE 2-3 GM-%(50ML) IV SOLR
INTRAVENOUS | Status: DC | PRN
  Administered 2017-04-06: 2 g via INTRAVENOUS

## 2017-04-06 MED ORDER — FENTANYL CITRATE (PF) 250 MCG/5ML IJ SOLN
INTRAMUSCULAR | Status: AC
Start: 2017-04-06 — End: ?
  Filled 2017-04-06: qty 5

## 2017-04-06 MED ORDER — SODIUM CHLORIDE 0.9 % IV SOLN
INTRAVENOUS | Status: AC
  Filled 2017-04-06: qty 1.2

## 2017-04-06 MED ORDER — LIDOCAINE-EPINEPHRINE (PF) 1 %-1:200000 IJ SOLN
INTRAMUSCULAR | Status: AC
  Filled 2017-04-06: qty 30

## 2017-04-06 MED ORDER — ALBUTEROL SULFATE (2.5 MG/3ML) 0.083% IN NEBU
INHALATION_SOLUTION | RESPIRATORY_TRACT | Status: AC
  Administered 2017-04-06: 2.5 mg
  Filled 2017-04-06: qty 3

## 2017-04-06 MED ORDER — 0.9 % SODIUM CHLORIDE (POUR BTL) OPTIME
TOPICAL | Status: DC | PRN
  Administered 2017-04-06: 1000 mL

## 2017-04-06 MED ORDER — CEFAZOLIN SODIUM 1 G IJ SOLR
INTRAMUSCULAR | Status: AC
  Filled 2017-04-06: qty 20

## 2017-04-06 MED ORDER — SODIUM CHLORIDE 0.9 % IV SOLN
INTRAVENOUS | Status: DC
  Administered 2017-04-06: 08:00:00 via INTRAVENOUS

## 2017-04-06 MED ORDER — ALBUTEROL SULFATE HFA 108 (90 BASE) MCG/ACT IN AERS
INHALATION_SPRAY | RESPIRATORY_TRACT | Status: DC | PRN
  Administered 2017-04-06: 2 via RESPIRATORY_TRACT

## 2017-04-06 MED ORDER — FENTANYL CITRATE (PF) 250 MCG/5ML IJ SOLN
INTRAMUSCULAR | Status: DC | PRN
  Administered 2017-04-06 (×2): 25 ug via INTRAVENOUS

## 2017-04-06 MED ORDER — LIDOCAINE-EPINEPHRINE (PF) 1 %-1:200000 IJ SOLN
INTRAMUSCULAR | Status: DC | PRN
  Administered 2017-04-06: 10 mL

## 2017-04-06 MED ORDER — HEPARIN SODIUM (PORCINE) 1000 UNIT/ML IJ SOLN
INTRAMUSCULAR | Status: AC
  Filled 2017-04-06: qty 1

## 2017-04-06 MED ORDER — PROPOFOL 500 MG/50ML IV EMUL
INTRAVENOUS | Status: DC | PRN
  Administered 2017-04-06: 75 ug/kg/min via INTRAVENOUS

## 2017-04-06 MED ORDER — SODIUM CHLORIDE 0.9 % IV SOLN
INTRAVENOUS | Status: DC | PRN
  Administered 2017-04-06: 08:00:00 500 mL

## 2017-04-06 SURGICAL SUPPLY — 46 items
BAG BANDED W/RUBBER/TAPE 36X54 (MISCELLANEOUS) ×2 IMPLANT
BAG DECANTER FOR FLEXI CONT (MISCELLANEOUS) ×3 IMPLANT
BIOPATCH RED 1 DISK 7.0 (GAUZE/BANDAGES/DRESSINGS) ×2 IMPLANT
BIOPATCH RED 1IN DISK 7.0MM (GAUZE/BANDAGES/DRESSINGS) ×1
CATH PALINDROME RT-P 15FX19CM (CATHETERS) IMPLANT
CATH PALINDROME RT-P 15FX23CM (CATHETERS) ×2 IMPLANT
CATH PALINDROME RT-P 15FX28CM (CATHETERS) IMPLANT
CATH PALINDROME RT-P 15FX55CM (CATHETERS) IMPLANT
COVER DOME SNAP 22 D (MISCELLANEOUS) ×2 IMPLANT
COVER PROBE W GEL 5X96 (DRAPES) ×3 IMPLANT
COVER SURGICAL LIGHT HANDLE (MISCELLANEOUS) ×3 IMPLANT
DERMABOND ADVANCED (GAUZE/BANDAGES/DRESSINGS) ×2
DERMABOND ADVANCED .7 DNX12 (GAUZE/BANDAGES/DRESSINGS) ×1 IMPLANT
DRAPE C-ARM 42X72 X-RAY (DRAPES) ×3 IMPLANT
DRAPE CHEST BREAST 15X10 FENES (DRAPES) ×3 IMPLANT
DRSG COVADERM 4X6 (GAUZE/BANDAGES/DRESSINGS) ×2 IMPLANT
GAUZE SPONGE 4X4 16PLY XRAY LF (GAUZE/BANDAGES/DRESSINGS) ×3 IMPLANT
GLOVE BIOGEL PI IND STRL 6 (GLOVE) IMPLANT
GLOVE BIOGEL PI IND STRL 7.5 (GLOVE) ×1 IMPLANT
GLOVE BIOGEL PI INDICATOR 6 (GLOVE) ×4
GLOVE BIOGEL PI INDICATOR 7.5 (GLOVE) ×2
GLOVE SURG SS PI 7.5 STRL IVOR (GLOVE) ×3 IMPLANT
GOWN STRL REUS W/ TWL LRG LVL3 (GOWN DISPOSABLE) ×1 IMPLANT
GOWN STRL REUS W/ TWL XL LVL3 (GOWN DISPOSABLE) ×1 IMPLANT
GOWN STRL REUS W/TWL LRG LVL3 (GOWN DISPOSABLE) ×4
GOWN STRL REUS W/TWL XL LVL3 (GOWN DISPOSABLE) ×3
KIT BASIN OR (CUSTOM PROCEDURE TRAY) ×3 IMPLANT
KIT TURNOVER KIT B (KITS) ×3 IMPLANT
NDL 18GX1X1/2 (RX/OR ONLY) (NEEDLE) ×1 IMPLANT
NDL HYPO 25GX1X1/2 BEV (NEEDLE) ×1 IMPLANT
NEEDLE 18GX1X1/2 (RX/OR ONLY) (NEEDLE) ×3 IMPLANT
NEEDLE HYPO 25GX1X1/2 BEV (NEEDLE) ×3 IMPLANT
NS IRRIG 1000ML POUR BTL (IV SOLUTION) ×3 IMPLANT
PACK SURGICAL SETUP 50X90 (CUSTOM PROCEDURE TRAY) ×3 IMPLANT
PAD ARMBOARD 7.5X6 YLW CONV (MISCELLANEOUS) ×6 IMPLANT
SET MICROPUNCTURE 5F STIFF (MISCELLANEOUS) ×2 IMPLANT
SOAP 2 % CHG 4 OZ (WOUND CARE) ×3 IMPLANT
SUT ETHILON 3 0 PS 1 (SUTURE) ×3 IMPLANT
SUT VICRYL 4-0 PS2 18IN ABS (SUTURE) ×3 IMPLANT
SYR 10ML LL (SYRINGE) ×3 IMPLANT
SYR 20CC LL (SYRINGE) ×6 IMPLANT
SYR 5ML LL (SYRINGE) ×3 IMPLANT
SYR CONTROL 10ML LL (SYRINGE) ×3 IMPLANT
TOWEL GREEN STERILE (TOWEL DISPOSABLE) ×6 IMPLANT
TOWEL GREEN STERILE FF (TOWEL DISPOSABLE) ×3 IMPLANT
WATER STERILE IRR 1000ML POUR (IV SOLUTION) ×3 IMPLANT

## 2017-04-06 NOTE — Transfer of Care (Signed)
Immediate Anesthesia Transfer of Care Note  Patient: NIAJAH SIPOS  Procedure(s) Performed: INSERTION OF DIALYSIS CATHETER (N/A Neck)  Patient Location: PACU  Anesthesia Type:MAC  Level of Consciousness: drowsy and patient cooperative  Airway & Oxygen Therapy: Patient Spontanous Breathing and Patient connected to face mask oxygen  Post-op Assessment: Report given to RN and Post -op Vital signs reviewed and stable  Post vital signs: Reviewed and stable  Last Vitals:  Vitals Value Taken Time  BP 166/72 04/06/2017 10:08 AM  Temp    Pulse 81 04/06/2017 10:10 AM  Resp 20 04/06/2017 10:10 AM  SpO2 100 % 04/06/2017 10:10 AM  Vitals shown include unvalidated device data.  Last Pain:  Vitals:   04/06/17 0510  TempSrc: Oral  PainSc:          Complications: No apparent anesthesia complications

## 2017-04-06 NOTE — Progress Notes (Addendum)
[facility] Progress Note  Patient: [name] QAS:[id number]   PCP: Alycia Rossetti, MD DOB: 1925/03/19   DOA: 04/05/2017   DOS: 04/06/2017   Date of Service: the patient was seen and examined on 04/06/2017  Subjective: Feeling better, breathing better.  No nausea no vomiting.  Feeling actually hungry.  Tolerating oral diet.  Brief hospital course: Pt. with PMH of HTN, type II DM, hypothyroidism, COPD, chronic respiratory failure, ESRD on HD MWF, anxiety; admitted on 04/05/2017, presented with complaint of elevated blood pressure, was found to have access issue with missed hemodialysis. Currently further plan is continue HD.  Assessment and Plan: 1. ESRD with access problem  - Patient typically dialyzes MWF, reports completing session on 04/01/17, but unable to dialyze since d/t problem with her access in LUE - She presented for [facility] placement, but procedure cancelled due to critical hyperkalemia  - She has been treated with temporizing measures for hyperkalemia  - Nephrology and vascular surgery consulting  - Underwent HD, acute post [facility] placement and tolerating it very well.  Recheck labs tomorrow. -Weight 79.3 before HD,, per outpatient regimen EDW 76.5.  Recheck tomorrow.  2. Hyperkalemia  - Potassium was 6.6 on admission, after temporizing changes, Kayexalate and albuterol neb went down to 5.6.  Reached up again at 6.1 today. - EKG without significant changes  - Continue cardiac monitoring, check labs tomorrow post HD to ensure stability.  Today potassium is normalized.  3. Hypertension with hypertensive urgency  - BP 220/100 on admission  - Secondary to hypervolemia and anticipate improvement with HD  - Continue diltiazem   4. COPD; chronic hypoxic respiratory failure  - No wheezing of obstructive breathing  - Mild dyspnea with speech likely related to hypervolemia  - Continue prn albuterol, supplemental O2    5. Hypothyroidism  - Continue Synthroid    6.  Depression with anxiety  - Stable   - Continue Celexa and Remeron    7. Anemia  - Hgb is 9.7 on admission, similar to priors , stable now - No bleeding, likely secondary to CKD    8. Hx of DM; hyperglycemia  - Has documented hx of DM, but most recent A1c was 5.3% and she is no longer on diabetes medications  - Serum glucose 200 on admission  - Check CBG's and use a low-intensity SSI as needed   Diet: Renal diet DVT Prophylaxis: subcutaneous Heparin  Advance goals of care discussion: DNR/DNI  Family Communication: no family was present at bedside, at the time of interview.  Disposition:  Discharge to home likely tomorrow pending clearance from nephrology and vascular surgery.  Consultants: Nephrology, vascular surgery Procedures: TDC placement  Antibiotics: Anti-infectives (From admission, onward)   Start     Dose/Rate Route Frequency Ordered Stop   04/05/17 1330  ceFAZolin (ANCEF) IVPB 2g/100 mL premix  Status:  Discontinued     2 g 200 mL/hr over 30 Minutes Intravenous  Once 04/05/17 1329 04/05/17 1950       Objective: Physical Exam: Vitals:   04/06/17 1600 04/06/17 1700 04/06/17 1733 04/06/17 1819  BP: (!) 116/57 (!) 115/47 (!) 149/89 136/86  Pulse: 65 75 66 68  Resp: 13 12 14 16   Temp:   98.2 F (36.8 C) 98.3 F (36.8 C)  TempSrc:   Oral Oral  SpO2:   100% 100%  Weight:   76.4 kg (168 lb 6.9 oz)   Height:        Intake/Output Summary (Last 24 hours)  at 04/06/2017 1822 Last data filed at 04/06/2017 1137 Gross per 24 hour  Intake 360 ml  Output 10 ml  Net 350 ml   Filed Weights   04/06/17 0758 04/06/17 1351 04/06/17 1733  Weight: 76.8 kg (169 lb 6.4 oz) 79.3 kg (174 lb 13.2 oz) 76.4 kg (168 lb 6.9 oz)   General: Alert, Awake and Oriented to Time, Place and Person. Appear in mild distress, affect appropriate Eyes: PERRL, Conjunctiva normal ENT: Oral Mucosa clear moist. Neck: no JVD, no Abnormal Mass Or lumps Cardiovascular: S1 and S2 Present, aortic  systolic  Murmur, Peripheral Pulses Present Respiratory: normal respiratory effort, Bilateral Air entry equal and Decreased, no use of accessory muscle, Clear to Auscultation, no Crackles, no wheezes Abdomen: Bowel Sound present, Soft and no tenderness, no hernia Skin: no redness, no Rash, no induration Extremities: nono Pedal edema, no calf tenderness Neurologic: Grossly no focal neuro deficit. Bilaterally Equal motor strength  Data Reviewed: CBC: Recent Labs  Lab 04/05/17 1301 04/05/17 1803 04/06/17 0527 04/06/17 0830 04/06/17 0857  WBC  --  6.0 7.0  --   --   HGB 11.2* 9.7* 9.5* 9.9* 9.9*  HCT 33.0* 30.5* 29.1* 29.0* 29.0*  MCV  --  79.4 79.3  --   --   PLT  --  156 138*  --   --    Basic Metabolic Panel: Recent Labs  Lab 04/05/17 1825 04/05/17 1830  04/06/17 0527 04/06/17 0830 04/06/17 0857 04/06/17 1146 04/06/17 1650  NA 138 139  --  140 134* 137  --   --   K 5.6* 5.7*   < > 6.1* 6.0* 5.9* 5.6* 3.8  CL 96* 97*  --  97*  --   --   --   --   CO2 24 26  --  28  --   --   --   --   GLUCOSE 195* 192*  --  76 82 84  --   --   BUN 81* 81*  --  87*  --   --   --   --   CREATININE 11.03* 10.93*  --  11.56*  --   --   --   --   CALCIUM 8.6* 8.6*  --  8.7*  --   --   --   --   PHOS  --  4.9*  --   --   --   --   --   --    < > = values in this interval not displayed.    Liver Function Tests: Recent Labs  Lab 04/05/17 1830  ALBUMIN 3.5   No results for input(s): LIPASE, AMYLASE in the last 168 hours. No results for input(s): AMMONIA in the last 168 hours. Coagulation Profile: Recent Labs  Lab 04/05/17 1803  INR 1.19   Cardiac Enzymes: No results for input(s): CKTOTAL, CKMB, CKMBINDEX, TROPONINI in the last 168 hours. BNP (last 3 results) No results for input(s): PROBNP in the last 8760 hours. CBG: Recent Labs  Lab 04/06/17 0553 04/06/17 0743 04/06/17 1011 04/06/17 1155 04/06/17 1807  GLUCAP 76 78 106* 95 149*   Studies: Dg Chest 1 View  Result Date:  04/06/2017 CLINICAL DATA:  Central line placement EXAM: CHEST  1 VIEW COMPARISON:  03/28/2017 FINDINGS: Right dialysis catheter has been placed with the tip in the upper right atrium. No pneumothorax. Cardiomegaly, vascular congestion. Bibasilar atelectasis. Suspect small effusions. IMPRESSION: Right dialysis catheter tip in the upper right atrium. No  pneumothorax. Cardiomegaly, vascular congestion. Bibasilar atelectasis and small effusions. Electronically Signed   By: Rolm Baptise M.D.   On: 04/06/2017 10:52    Scheduled Meds: . [START ON 04/07/2017] aspirin EC  81 mg Oral Daily  . cinacalcet  30 mg Oral q1800  . citalopram  20 mg Oral Daily  . diltiazem  180 mg Oral Daily  . docusate sodium  100 mg Oral BID  . insulin aspart  0-9 Units Subcutaneous Q4H  . isosorbide mononitrate  60 mg Oral Daily  . levothyroxine  25 mcg Oral QAC breakfast  . mirtazapine  7.5 mg Oral QHS  . pantoprazole  40 mg Oral Daily  . sevelamer carbonate  2,400 mg Oral TID WC  . sodium chloride flush  3 mL Intravenous Q12H  . sodium chloride flush  3 mL Intravenous Q12H   Continuous Infusions: . sodium chloride    . sodium chloride 10 mL/hr at 04/06/17 0809   PRN Meds: sodium chloride, acetaminophen **OR** acetaminophen, albuterol, feeding supplement (NEPRO CARB STEADY), guaiFENesin-dextromethorphan, hydrALAZINE, ondansetron **OR** ondansetron (ZOFRAN) IV, polyethylene glycol, polyvinyl alcohol, sodium chloride flush, traMADol  Time spent: 35 minutes  Author: Berle Mull, MD [facility] Pager: [phone] 04/06/2017 6:22 PM  If 7PM-7AM, please contact night-coverage at [email or web address], password [facility]

## 2017-04-06 NOTE — Interval H&P Note (Signed)
History and Physical Interval Note:  04/06/2017 8:20 AM  [name]  has presented today for surgery, with the diagnosis of Renal failure  The various methods of treatment have been discussed with the patient and family. After consideration of risks, benefits and other options for treatment, the patient has consented to  Procedure(s): INSERTION OF DIALYSIS CATHETER (N/A) as a surgical intervention .  The patient's history has been reviewed, patient examined, no change in status, stable for surgery.  I have reviewed the patient's chart and labs.  Questions were answered to the patient's satisfaction.     Annamarie Major

## 2017-04-06 NOTE — Op Note (Signed)
Procedure: Ultrasound-guided insertion of Right IJ Palidrome catheter  Preoperative diagnosis: End-stage renal disease  Postoperative diagnosis: Same  Anesthesia: Local with IV sedation  Operative findings: 23 cm Palindrome catheter right internal jugular vein  Operative details: After obtaining informed consent, the patient was taken to the operating room. The patient was placed in supine position on the operating room table. After adequate sedation the patient's entire neck and chest were prepped and draped in usual sterile fashion. The patient was placed in Trendelenburg position. Ultrasound was used to identify the patient's right internal jugular vein. This had normal compressibility and respiratory variation. Local anesthesia was infiltrated over the right jugular vein.  Using ultrasound guidance, the right internal jugular vein was successfully cannulated.  A 0.035 J-tipped guidewire was threaded into the right internal jugular vein and into the superior vena cava followed by the inferior vena cava under fluoroscopic guidance.   Next sequential 12 and 14 dilators were placed over the guidewire into the right atrium.  A 16 French dilator with a peel-away sheath was then placed over the guidewire into the right atrium.   The guidewire and dilator were removed. A 23 cm Palindrome catheter was then placed through the peel away sheath into the right atrium.  The catheter was then tunneled subcutaneously, cut to length, and the hub attached. The catheter was noted to flush and draw easily. The catheter was inspected under fluoroscopy and found with its tip to be in the right atrium without any kinks throughout its course. The catheter was sutured to the skin with nylon sutures. The neck insertion site was closed with Vicryl stitch. The catheter was then loaded with concentrated Heparin solution. A dry sterile dressing was applied.  The patient tolerated procedure well and there were no complications.  Instrument sponge and needle counts correct in the case. The patient was taken to the recovery room in stable condition. Chest x-ray will be obtained in the recovery room.  Annamarie Major, MD [facility] Office: [phone] Pager: [phone]

## 2017-04-06 NOTE — Anesthesia Postprocedure Evaluation (Signed)
Anesthesia Post Note  Patient: [name]  Procedure(s) Performed: INSERTION OF DIALYSIS CATHETER (N/A Neck)     Patient location during evaluation: PACU Anesthesia Type: MAC Level of consciousness: awake and alert Pain management: pain level controlled Vital Signs Assessment: post-procedure vital signs reviewed and stable Respiratory status: spontaneous breathing, nonlabored ventilation, respiratory function stable and patient connected to nasal cannula oxygen Cardiovascular status: stable and blood pressure returned to baseline Postop Assessment: no apparent nausea or vomiting Anesthetic complications: no    Last Vitals:  Vitals:   04/06/17 1500 04/06/17 1530  BP: (!) 148/69 127/64  Pulse: 69 71  Resp: 15 12  Temp:    SpO2:      Last Pain:  Vitals:   04/06/17 1351  TempSrc: Oral  PainSc:                  [name]

## 2017-04-06 NOTE — Progress Notes (Signed)
Called patient's daughters, per patient's request,  to let them know their mom is going for surgery this morning.

## 2017-04-06 NOTE — Consult Note (Addendum)
Reason for Consult: ESRD Referring Physician: Dr. Mitzi Hansen  Chief Complaint: Unable to dialyze  Assessment: 1. ESRD w/ hyperkalemia - last dialysis was last Friday but likely had significant recirculation. - 2/2.5 bath (usually gets heparin 2000 bolus, hourly infusion 500 units/hr, EDW 76.5kg) - Will dialyze on 1K bath for only 1 hr and then 2K - goal UF 2-3 L as tolerated; not sure recorded weights are correct as the weight after surgery is higher than before surgery. We will try to get a floor weight prior to HD. - Return to [facility] on MWF regimen upon discharge. 2. HTN - restart home medications; UF on dialysis will help as well. We will try to weight the pt after dialysis today. 3. DM 4. Anemia - stable 5. Hypothyroid  Pt seen on dialysis at 220PM. On the bedscale she is 67.5kg but i'm not convinced that scale is correct. We will UF and monitor her closely. I spoke with the tech and nursing staff instructing them to watch closely and turn off the UF if there is a significant drop in SBP or if the pt is symptomatic. Using the RIJ catheter.   HPI: [name] is an 84 y.o. female  with medical history significant for hypertension, diabetes mellitus now diet-controlled, hypothyroidism, COPD with chronic hypoxic respiratory failure, depression with anxiety,  and ESRD on HD, admitted for hyperkalemia, hypovolemia, and hypertensive urgency in the setting of ESRD status problem and missed dialysis.  Her last completed dialysis treatment was last Friday and per center there were no reported issues. She then had issues trying to dialyze subsequently and presented to vascular surgery for pllacement of a dialysis catheter.   She was treated with 30 g of Kayexalate and albuterol neb for hyperkalemia with no  EKG findings and subsequently had a TC placed.    ROS Pertinent items are noted in HPI.  Chemistry and CBC: Creat  Date/Time Value Ref Range Status  06/01/2016 02:44 PM 6.94 (H)  0.60 - 0.88 mg/dL Final    Comment:      For patients > or = 82 years of age: The upper reference limit for Creatinine is approximately 13% higher for people identified as African-American.     03/29/2013 10:06 AM 4.46 (H) 0.50 - 1.10 mg/dL Final  09/04/2010 11:26 AM 2.95 (H) 0.50 - 1.10 mg/dL Final    Comment:    Result repeated and verified.   Creatinine, Ser  Date/Time Value Ref Range Status  04/06/2017 05:27 AM 11.56 (H) 0.44 - 1.00 mg/dL Final  04/05/2017 06:30 PM 10.93 (H) 0.44 - 1.00 mg/dL Final  04/05/2017 06:25 PM 11.03 (H) 0.44 - 1.00 mg/dL Final  12/31/2016 03:00 PM 7.54 (H) 0.44 - 1.00 mg/dL Final  12/31/2016 04:46 AM 6.92 (H) 0.44 - 1.00 mg/dL Final  12/30/2016 05:18 PM 6.22 (H) 0.44 - 1.00 mg/dL Final  08/14/2016 06:25 AM 5.00 (H) 0.44 - 1.00 mg/dL Final  08/13/2016 05:08 AM 5.98 (H) 0.44 - 1.00 mg/dL Final    Comment:    DELTA CHECK NOTED  08/12/2016 06:16 AM 3.83 (H) 0.44 - 1.00 mg/dL Final    Comment:    DELTA CHECK NOTED  08/11/2016 06:08 AM 7.66 (H) 0.44 - 1.00 mg/dL Final  08/11/2016 12:06 AM 8.61 (H) 0.44 - 1.00 mg/dL Final  08/10/2016 05:51 PM 6.96 (H) 0.44 - 1.00 mg/dL Final  12-25-202018 03:30 AM 10.18 (H) 0.44 - 1.00 mg/dL Final  05/08/2016 01:15 AM 6.45 (H) 0.44 - 1.00 mg/dL Final  Comment:    DELTA CHECK NOTED  05/07/2016 11:26 AM 12.18 (H) 0.44 - 1.00 mg/dL Final  05/07/2016 06:56 AM 11.59 (H) 0.44 - 1.00 mg/dL Final  05/06/2016 02:35 PM 10.74 (H) 0.44 - 1.00 mg/dL Final  04/22/2016 12:24 PM 6.05 (H) 0.44 - 1.00 mg/dL Final  11/26/2015 03:00 PM 5.43 (H) 0.44 - 1.00 mg/dL Final  10/03/2015 02:09 AM 8.35 (H) 0.44 - 1.00 mg/dL Final  10/02/2015 03:07 PM 7.50 (H) 0.44 - 1.00 mg/dL Final  05/15/2015 09:05 AM 5.90 (H) 0.44 - 1.00 mg/dL Final  02/05/2011 04:54 AM 3.26 (H) 0.50 - 1.10 mg/dL Final  02/04/2011 05:02 AM 2.98 (H) 0.50 - 1.10 mg/dL Final  02/03/2011 05:00 AM 3.51 (H) 0.50 - 1.10 mg/dL Final  02/02/2011 06:14 AM 3.67 (H) 0.50 - 1.10 mg/dL  Final  02/01/2011 01:54 AM 3.75 (H) 0.50 - 1.10 mg/dL Final  01/31/2011 08:34 PM 3.71 (H) 0.50 - 1.10 mg/dL Final  01/26/2011 05:25 AM 3.16 (H) 0.50 - 1.10 mg/dL Final  01/25/2011 05:19 AM 2.96 (H) 0.50 - 1.10 mg/dL Final  01/24/2011 05:34 AM 2.76 (H) 0.50 - 1.10 mg/dL Final  01/24/2011 05:34 AM 2.81 (H) 0.50 - 1.10 mg/dL Final  01/23/2011 06:09 AM 2.78 (H) 0.50 - 1.10 mg/dL Final  01/22/2011 05:33 AM 2.66 (H) 0.50 - 1.10 mg/dL Final  01/21/2011 03:37 PM 2.62 (H) 0.50 - 1.10 mg/dL Final  01/20/2011 05:21 AM 2.65 (H) 0.50 - 1.10 mg/dL Final  01/19/2011 06:19 AM 2.58 (H) 0.50 - 1.10 mg/dL Final  01/18/2011 11:56 PM 2.61 (H) 0.50 - 1.10 mg/dL Final  01/17/2011 08:54 PM 2.36 (H) 0.50 - 1.10 mg/dL Final  01/15/2011 02:22 PM 2.74 (H) 0.50 - 1.10 mg/dL Final  01/13/2011 04:50 AM 2.63 (H) 0.50 - 1.10 mg/dL Final  01/11/2011 05:38 AM 2.68 (H) 0.50 - 1.10 mg/dL Final  01/10/2011 04:13 AM 2.79 (H) 0.50 - 1.10 mg/dL Final  01/09/2011 07:59 AM 2.98 (H) 0.50 - 1.10 mg/dL Final  01/08/2011 05:07 AM 3.35 (H) 0.50 - 1.10 mg/dL Final  01/07/2011 05:24 AM 3.71 (H) 0.50 - 1.10 mg/dL Final  01/06/2011 05:40 AM 3.64 (H) 0.50 - 1.10 mg/dL Final  01/05/2011 12:15 PM 3.57 (H) 0.50 - 1.10 mg/dL Final  12/09/2010 10:55 AM 3.31 (H) 0.50 - 1.10 mg/dL Final  11/11/2010 12:45 PM 2.88 (H) 0.50 - 1.10 mg/dL Final  10/28/2010 09:37 AM 2.80 (H) 0.50 - 1.10 mg/dL Final  10/07/2010 02:20 PM 3.03 (H) 0.50 - 1.10 mg/dL Final   Recent Labs  Lab 04/05/17 1301  04/05/17 1825 04/05/17 1830 04/05/17 2158 04/06/17 0123 04/06/17 0527 04/06/17 0830 04/06/17 0857 04/06/17 1146  NA 136  --  138 139  --   --  140 134* 137  --   K 6.3*   < > 5.6* 5.7* 5.5* 5.1 6.1* 6.0* 5.9* 5.6*  CL  --   --  96* 97*  --   --  97*  --   --   --   CO2  --   --  24 26  --   --  28  --   --   --   GLUCOSE 116*  --  195* 192*  --   --  76 82 84  --   BUN  --   --  81* 81*  --   --  87*  --   --   --   CREATININE  --   --  11.03* 10.93*  --    --  11.56*  --   --   --   CALCIUM  --   --  8.6* 8.6*  --   --  8.7*  --   --   --   PHOS  --   --   --  4.9*  --   --   --   --   --   --    < > = values in this interval not displayed.   Recent Labs  Lab 04/05/17 1803 04/06/17 0527 04/06/17 0830 04/06/17 0857  WBC 6.0 7.0  --   --   HGB 9.7* 9.5* 9.9* 9.9*  HCT 30.5* 29.1* 29.0* 29.0*  MCV 79.4 79.3  --   --   PLT 156 138*  --   --    Liver Function Tests: Recent Labs  Lab 04/05/17 1830  ALBUMIN 3.5   No results for input(s): LIPASE, AMYLASE in the last 168 hours. No results for input(s): AMMONIA in the last 168 hours. Cardiac Enzymes: No results for input(s): CKTOTAL, CKMB, CKMBINDEX, TROPONINI in the last 168 hours. Iron Studies: No results for input(s): IRON, TIBC, TRANSFERRIN, FERRITIN in the last 72 hours. PT/INR: _0 (inr:5)  Xrays/Other Studies: ) Results for orders placed or performed during the hospital encounter of 04/05/17 (from the past 48 hour(s))  I-STAT 4, (NA,K, GLUC, HGB,HCT)     Status: Abnormal   Collection Time: 04/05/17  1:01 PM  Result Value Ref Range   Sodium 136 135 - 145 mmol/L   Potassium 6.3 (HH) 3.5 - 5.1 mmol/L   Glucose, Bld 116 (H) 65 - 99 mg/dL   HCT 33.0 (L) 36.0 - 46.0 %   Hemoglobin 11.2 (L) 12.0 - 15.0 g/dL   Comment NOTIFIED PHYSICIAN   Potassium     Status: Abnormal   Collection Time: 04/05/17  1:08 PM  Result Value Ref Range   Potassium 6.6 (HH) 3.5 - 5.1 mmol/L    Comment: NO VISIBLE HEMOLYSIS CRITICAL RESULT CALLED TO, READ BACK BY AND VERIFIED WITH: R TODD RN 1424 04/05/2017 BY A BENNETT Performed at [facility], [place] [address], [place], [place] [address]   Surgical pcr screen     Status: None   Collection Time: 04/05/17  1:09 PM  Result Value Ref Range   MRSA, PCR NEGATIVE NEGATIVE   Staphylococcus aureus NEGATIVE NEGATIVE    Comment: (NOTE) The Xpert SA Assay (FDA approved for NASAL specimens in patients 8 years of age and older), is one component  of a comprehensive surveillance program. It is not intended to diagnose infection nor to guide or monitor treatment. Performed at [facility], [place] [address], [place], [place] [address]   CBC     Status: Abnormal   Collection Time: 04/05/17  6:03 PM  Result Value Ref Range   WBC 6.0 4.0 - 10.5 K/uL   RBC 3.84 (L) 3.87 - 5.11 MIL/uL   Hemoglobin 9.7 (L) 12.0 - 15.0 g/dL   HCT 30.5 (L) 36.0 - 46.0 %   MCV 79.4 78.0 - 100.0 fL   MCH 25.3 (L) 26.0 - 34.0 pg   MCHC 31.8 30.0 - 36.0 g/dL   RDW 15.0 11.5 - 15.5 %   Platelets 156 150 - 400 K/uL    Comment: Performed at [facility], [place] [address] [place], [place] [address]  Protime-INR     Status: None   Collection Time: 04/05/17  6:03 PM  Result Value Ref Range   Prothrombin Time  15.0 11.4 - 15.2 seconds   INR 1.19     Comment: Performed at [facility], [place] [address], [place], [place] [address]  Basic metabolic panel     Status: Abnormal   Collection Time: 04/05/17  6:25 PM  Result Value Ref Range   Sodium 138 135 - 145 mmol/L   Potassium 5.6 (H) 3.5 - 5.1 mmol/L   Chloride 96 (L) 101 - 111 mmol/L   CO2 24 22 - 32 mmol/L   Glucose, Bld 195 (H) 65 - 99 mg/dL   BUN 81 (H) 6 - 20 mg/dL   Creatinine, Ser 11.03 (H) 0.44 - 1.00 mg/dL   Calcium 8.6 (L) 8.9 - 10.3 mg/dL   GFR calc non Af Amer 3 (L) >60 mL/min   GFR calc Af Amer 3 (L) >60 mL/min    Comment: (NOTE) The eGFR has been calculated using the CKD EPI equation. This calculation has not been validated in all clinical situations. eGFR's persistently <60 mL/min signify possible Chronic Kidney Disease.    Anion gap 18 (H) 5 - 15    Comment: Performed at [facility], [place] [address], [place], [place] [address]  Renal function panel     Status: Abnormal   Collection Time: 04/05/17  6:30 PM  Result Value Ref Range   Sodium 139 135 - 145 mmol/L   Potassium 5.7 (H) 3.5 - 5.1 mmol/L   Chloride 97 (L) 101 - 111 mmol/L   CO2 26 22 - 32 mmol/L   Glucose,  Bld 192 (H) 65 - 99 mg/dL   BUN 81 (H) 6 - 20 mg/dL   Creatinine, Ser 10.93 (H) 0.44 - 1.00 mg/dL   Calcium 8.6 (L) 8.9 - 10.3 mg/dL   Phosphorus 4.9 (H) 2.5 - 4.6 mg/dL   Albumin 3.5 3.5 - 5.0 g/dL   GFR calc non Af Amer 3 (L) >60 mL/min   GFR calc Af Amer 3 (L) >60 mL/min    Comment: (NOTE) The eGFR has been calculated using the CKD EPI equation. This calculation has not been validated in all clinical situations. eGFR's persistently <60 mL/min signify possible Chronic Kidney Disease.    Anion gap 16 (H) 5 - 15    Comment: Performed at [facility], [place] [address], [place], [place] [address]  Glucose, capillary     Status: Abnormal   Collection Time: 04/05/17  9:50 PM  Result Value Ref Range   Glucose-Capillary 222 (H) 65 - 99 mg/dL   Comment 1 Notify RN    Comment 2 Document in Chart   Potassium     Status: Abnormal   Collection Time: 04/05/17  9:58 PM  Result Value Ref Range   Potassium 5.5 (H) 3.5 - 5.1 mmol/L    Comment: Performed at [facility], [place] [address], [place], [place] [address]  Glucose, capillary     Status: Abnormal   Collection Time: 04/06/17 12:08 AM  Result Value Ref Range   Glucose-Capillary 177 (H) 65 - 99 mg/dL   Comment 1 Notify RN    Comment 2 Document in Chart   Potassium     Status: None   Collection Time: 04/06/17  1:23 AM  Result Value Ref Range   Potassium 5.1 3.5 - 5.1 mmol/L    Comment: Performed at [facility], [place] [address], [place], [place] [address]  Basic metabolic panel     Status: Abnormal   Collection Time: 04/06/17  5:27  AM  Result Value Ref Range   Sodium 140 135 - 145 mmol/L   Potassium 6.1 (H) 3.5 - 5.1 mmol/L   Chloride 97 (L) 101 - 111 mmol/L   CO2 28 22 - 32 mmol/L   Glucose, Bld 76 65 - 99 mg/dL   BUN 87 (H) 6 - 20 mg/dL   Creatinine, Ser 11.56 (H) 0.44 - 1.00 mg/dL   Calcium 8.7 (L) 8.9 - 10.3 mg/dL   GFR calc non Af Amer 3 (L) >60 mL/min   GFR calc Af Amer 3 (L) >60 mL/min    Comment:  (NOTE) The eGFR has been calculated using the CKD EPI equation. This calculation has not been validated in all clinical situations. eGFR's persistently <60 mL/min signify possible Chronic Kidney Disease.    Anion gap 15 5 - 15    Comment: Performed at [place] [address] [place], [place] [address]  CBC     Status: Abnormal   Collection Time: 04/06/17  5:27 AM  Result Value Ref Range   WBC 7.0 4.0 - 10.5 K/uL   RBC 3.67 (L) 3.87 - 5.11 MIL/uL   Hemoglobin 9.5 (L) 12.0 - 15.0 g/dL   HCT 29.1 (L) 36.0 - 46.0 %   MCV 79.3 78.0 - 100.0 fL   MCH 25.9 (L) 26.0 - 34.0 pg   MCHC 32.6 30.0 - 36.0 g/dL   RDW 15.0 11.5 - 15.5 %   Platelets 138 (L) 150 - 400 K/uL    Comment: Performed at [facility], [place] [address], [place], [place] [address]  Glucose, capillary     Status: None   Collection Time: 04/06/17  5:53 AM  Result Value Ref Range   Glucose-Capillary 76 65 - 99 mg/dL  Glucose, capillary     Status: None   Collection Time: 04/06/17  7:43 AM  Result Value Ref Range   Glucose-Capillary 78 65 - 99 mg/dL   Comment 1 Notify RN    Comment 2 Document in Chart   I-STAT 4, (NA,K, GLUC, HGB,HCT)     Status: Abnormal   Collection Time: 04/06/17  8:30 AM  Result Value Ref Range   Sodium 134 (L) 135 - 145 mmol/L   Potassium 6.0 (H) 3.5 - 5.1 mmol/L   Glucose, Bld 82 65 - 99 mg/dL   HCT 29.0 (L) 36.0 - 46.0 %   Hemoglobin 9.9 (L) 12.0 - 15.0 g/dL  I-STAT 4, (NA,K, GLUC, HGB,HCT)     Status: Abnormal   Collection Time: 04/06/17  8:57 AM  Result Value Ref Range   Sodium 137 135 - 145 mmol/L   Potassium 5.9 (H) 3.5 - 5.1 mmol/L   Glucose, Bld 84 65 - 99 mg/dL   HCT 29.0 (L) 36.0 - 46.0 %   Hemoglobin 9.9 (L) 12.0 - 15.0 g/dL  Glucose, capillary     Status: Abnormal   Collection Time: 04/06/17 10:11 AM  Result Value Ref Range   Glucose-Capillary 106 (H) 65 - 99 mg/dL  Potassium     Status: Abnormal   Collection Time: 04/06/17 11:46 AM  Result Value Ref Range    Potassium 5.6 (H) 3.5 - 5.1 mmol/L    Comment: Performed at [facility], [address], [place], [place] [address]  Glucose, capillary     Status: None   Collection Time: 04/06/17 11:55 AM  Result Value Ref Range   Glucose-Capillary 95 65 - 99 mg/dL   Comment 1 Notify RN  Comment 2 Document in Chart    Dg Chest 1 View  Result Date: 04/06/2017 CLINICAL DATA:  Central line placement EXAM: CHEST  1 VIEW COMPARISON:  03/28/2017 FINDINGS: Right dialysis catheter has been placed with the tip in the upper right atrium. No pneumothorax. Cardiomegaly, vascular congestion. Bibasilar atelectasis. Suspect small effusions. IMPRESSION: Right dialysis catheter tip in the upper right atrium. No pneumothorax. Cardiomegaly, vascular congestion. Bibasilar atelectasis and small effusions. Electronically Signed   By: Rolm Baptise M.D.   On: 04/06/2017 10:52    PMH:   Past Medical History:  Diagnosis Date  . Anemia   . Anemia in CKD (chronic kidney disease)   . Anxiety   . Arthritis    gout, right shoulder  . Cancer [facility])    ?stomach cancer   . Cataracts, both eyes   . CHF (congestive heart failure) ([place])   . Depression   . Diabetes mellitus, type 2 (HCC)    Type II - no medication  . Dyspnea   . ESRD (end stage renal disease) ([place])    [facility]  . GERD (gastroesophageal reflux disease)   . Gynecologic cancer   . HOH (hard of hearing)   . Hyperlipidemia   . Hypertension   . Hypothyroidism   . IBS (irritable bowel syndrome)    with costipation  . Intraoperative hemorrhage and hematoma of a digestive system organ or structure complicating a digestive system procedure   . Normal cardiac stress test 05/2015  . Overactive bladder   . Pneumonia    2 times in 2016  . SOB (shortness of breath)    nml spirometry 09/05/06  . Stroke [facility])    residual right side weakness and pain  . SVT (supraventricular tachycardia) ([place])   . Syncope and collapse   . Thyroid disease      PSH:   Past Surgical History:  Procedure Laterality Date  . ABDOMINAL HYSTERECTOMY    . AV FISTULA PLACEMENT, BRACHIOCEPHALIC  24/58/0998   left arm   by Dr. Bridgett Larsson  . COLONOSCOPY    . EYE SURGERY Bilateral    Cataract  . FISTULA SUPERFICIALIZATION Left 06/10/2015   Procedure: LEFT UPPER ARM BRACHIOCEPHALIC FISTULA PSEUDOANEURYSM PLICATION;  Surgeon: Conrad Morgan Farm, MD;  Location: [place];  Service: Vascular;  Laterality: Left;  . IR THROMBECTOMY AV FISTULA W/THROMBOLYSIS/PTA INC/SHUNT/IMG LEFT Left 05/06/2016  . IR US GUIDE VASC ACCESS LEFT  05/06/2016  . PERIPHERAL VASCULAR CATHETERIZATION Left 05/15/2015   Procedure: Fistulagram;  Surgeon: Conrad Cache, MD;  Location: [place] CV LAB;  Service: Cardiovascular;  Laterality: Left;  . TEE WITHOUT CARDIOVERSION  2011   2/2 Strep Bovis infection  . VESICOVAGINAL FISTULA CLOSURE W/ TAH      Allergies: No Known Allergies  Medications:   Prior to Admission medications   Medication Sig Start Date End Date Taking? Authorizing Provider  citalopram (CELEXA) 20 MG tablet TAKE (1) TABLET BY MOUTH ONCE IN THE MORNING. Patient taking differently: TAKE 20 MG BY MOUTH ONCE IN THE MORNING. 02/28/17  Yes [place], Modena Nunnery, MD  diltiazem (CARDIZEM CD) 180 MG 24 hr capsule TAKE 1 CAPSULE BY MOUTH ONCE A DAY. Patient taking differently: TAKE 180 MG BY MOUTH ONCE A DAY. 06/25/16  Yes [place], Modena Nunnery, MD  docusate sodium (COLACE) 100 MG capsule TAKE 1 CAPSULE BY MOUTH TWICE DAILY. Patient taking differently: TAKE 100 MG BY MOUTH TWICE DAILY. 02/28/17  Yes Z[place]ante, Modena Nunnery, MD  isosorbide  mononitrate (IMDUR) 60 MG 24 hr tablet TAKE (1) TABLET BY MOUTH ONCE DAILY. Patient taking differently: TAKE 60 MG BY MOUTH ONCE DAILY. 03/24/16  Yes Taylor, Modena Nunnery, MD  levothyroxine (SYNTHROID, LEVOTHROID) 25 MCG tablet TAKE (1) TABLET BY MOUTH ONCE DAILY BEFORE BREAKFAST. Patient taking differently: TAKE 25 MCG BY MOUTH ONCE DAILY BEFORE BREAKFAST. 02/28/17  Yes  [place], Modena Nunnery, MD  mirtazapine (REMERON) 15 MG tablet TAKE 1/2 TABLET (7.5MG) BY MOUTH AT BEDTIME FOR MOOD/APPETITE. 02/28/17  Yes [place], Modena Nunnery, MD  pantoprazole (PROTONIX) 40 MG tablet TAKE (1) TABLET BY MOUTH ONCE DAILY. Patient taking differently: TAKE 40 MG BY MOUTH ONCE DAILY. 07/26/16  Yes Reyno, Modena Nunnery, MD  sevelamer (RENVELA) 800 MG tablet Take 800-2,400 mg by mouth See admin instructions. Take 2400 mg by mouth three times daily with meals and take 800 mg by mouth once daily with snacks   Yes [provider]  albuterol (PROVENTIL) (2.5 MG/3ML) 0.083% nebulizer solution USE 1 VIAL IN NEBULIZER 3 TIMES DAILY AS NEEDED FOR WHEEZING/SHORTNESS OF BREATH. 10/01/15   Zeb, Modena Nunnery, MD  ARTIFICIAL TEARS 1.4 % ophthalmic solution INSTILL 1 DROP INTO EITHER EYE UP TO 6 TIMES DAILY AS NEEDED FOR LUBRICATION 08/18/16   Alycia Rossetti, MD  aspirin EC 81 MG tablet TAKE (1) TABLET BY MOUTH ONCE DAILY. Patient taking differently: TAKE 81 MG BY MOUTH ONCE DAILY. 02/28/17   [place], Modena Nunnery, MD  guaiFENesin-dextromethorphan (ROBITUSSIN DM) 100-10 MG/5ML syrup TAKE ONE TEASPOONFUL BY MOUTH EVERY 4 HOURS AS NEEDED FOR COUGH 02/17/16   Vic Blackbird F, MD  ipratropium (ATROVENT) 0.02 % nebulizer solution USE 1 VIAL IN NEBULIZER 3 TIMES DAILY AS NEEDED FOR WHEEZING/SHORTNESS OF BREATH. 10/01/15   Alycia Rossetti, MD  lidocaine-prilocaine (EMLA) cream APPLY TOPICALLY TO LEFT UPPER ARM 1 HOUR PRIOR TO DIALYSIS ON MONDAY,WEDNESDAY & FRIDAYS AS NEEDED FOR PAIN. WRAP WITH PLASTIC WRAP AFTER A 03/23/16   Alycia Rossetti, MD  Multiple Vitamin (TAB-A-VITE PO) Take 1 tablet by mouth daily.    [provider]  nitroGLYCERIN (NITROSTAT) 0.4 MG SL tablet DISSOLVE 1 TABLET UNDER THE TONGUE EVERY 5 MINUTES X2 DOSES THEN TO ER 08/18/16   Alycia Rossetti, MD  Nutritional Supplements (FEEDING SUPPLEMENT, NEPRO CARB STEADY,) LIQD Take 237 mLs by mouth 3 (three) times daily as needed (Supplement).  12/31/16   [place]el, Flonnie Overman, MD  nystatin (MYCOSTATIN/NYSTOP) powder Apply 4 (four) times daily as needed topically. To groin/ abd folds for irritation 11/18/16   Alycia Rossetti, MD  OXYGEN Inhale 2 L into the lungs daily as needed (shortness of breath).    [provider]  polyethylene glycol powder (MIRALAX) powder Take 17 g by mouth as needed for mild constipation. Hold for diarrhea Patient taking differently: Take 17 g by mouth daily.  08/14/16   Rai, Ripudeep K, MD  SENSIPAR 30 MG tablet TAKE 1 TABLET BY MOUTH ONCE DAILY AFTER THE EVENING MEAL *PROVIDED [facility]* Patient taking differently: TAKE 30 MG BY MOUTH ONCE DAILY AFTER THE EVENING MEAL *PROVIDED BY [facility]* 03/24/17   [place], Modena Nunnery, MD  traMADol (ULTRAM) 50 MG tablet Take 50 mg by mouth every 12 (twelve) hours as needed for moderate pain.    [provider]  VENTOLIN HFA 108 (90 Base) MCG/ACT inhaler INHALE (2) PUFFS EVERY 4 HOURS AS NEEDED FOR WHEEZING OR SHORTNESS OFBREATH(COUGH). 03/25/16   Alycia Rossetti, MD    Discontinued Meds:   Medications Discontinued During  This Encounter  Medication Reason  . isosorbide mononitrate (IMDUR) 60 MG 24 hr tablet Duplicate  . tobramycin (TOBREX) 0.3 % ophthalmic ointment Discontinued by provider  . Multiple Vitamins-Iron (MULTIVITAMINS WITH IRON) TABS tablet Discontinued by provider  . ceFAZolin (ANCEF) IVPB 2g/100 mL premix Patient Transfer  . hydrALAZINE (APRESOLINE) injection 10 mg   . 0.9 %  sodium chloride infusion   . Lancets MISC   . Multiple Vitamin (DAILY-VITE) TABS   . polyethylene glycol powder (GLYCOLAX/MIRALAX) container 17 g   . polyethylene glycol (MIRALAX / GLYCOLAX) packet 17 g   . heparin injection Patient Discharge  . lidocaine-EPINEPHrine (XYLOCAINE-EPINEPHrine) 1 %-1:200000 (PF) injection Patient Discharge  . heparin 6,000 Units in sodium chloride 0.9 % 500 mL irrigation Patient Discharge  . 0.9 % irrigation (POUR BTL) Patient  Discharge    Social History:  reports that she has never smoked. She has never used smokeless tobacco. She reports that she does not drink alcohol or use drugs.  Family History:   Family History  Problem Relation Age of Onset  . Stroke Father   . Hypertension Sister   . Heart failure Sister   . Cancer Brother     Blood pressure (!) 170/63, pulse 72, temperature 98.2 F (36.8 C), temperature source Oral, resp. rate 18, height 5' 6" (1.676 m), weight 76.8 kg (169 lb 6.4 oz), last menstrual period 12/24/2010, SpO2 100 %. General appearance: alert and cooperative Eyes: negative Neck: no adenopathy, no carotid bruit, supple, symmetrical, trachea midline and thyroid not enlarged, symmetric, no tenderness/mass/nodules Back: symmetric, no curvature. ROM normal. No CVA tenderness. Resp: rales bibasilar Chest wall: no tenderness Cardio: regular rate and rhythm, S1, S2 normal, no murmur, click, rub or gallop GI: soft, non-tender; bowel sounds normal; no masses,  no organomegaly Extremities: edema trace Pulses: 2+ and symmetric Skin: Skin color, texture, turgor normal. No rashes or lesions Lymph nodes: Cervical, supraclavicular, and axillary nodes normal. Neurologic: Grossly normal       Cassey Bacigalupo, Hunt Oris, MD 04/06/2017, 12:51 PM

## 2017-04-07 ENCOUNTER — Encounter (HOSPITAL_COMMUNITY): Payer: Self-pay | Admitting: Surgery

## 2017-04-07 DIAGNOSIS — Z789 Other specified health status: Secondary | ICD-10-CM | POA: Diagnosis present

## 2017-04-07 DIAGNOSIS — T82898A Other specified complication of vascular prosthetic devices, implants and grafts, initial encounter: Secondary | ICD-10-CM | POA: Diagnosis not present

## 2017-04-07 DIAGNOSIS — I132 Hypertensive heart and chronic kidney disease with heart failure and with stage 5 chronic kidney disease, or end stage renal disease: Secondary | ICD-10-CM | POA: Diagnosis not present

## 2017-04-07 DIAGNOSIS — E875 Hyperkalemia: Secondary | ICD-10-CM | POA: Diagnosis not present

## 2017-04-07 DIAGNOSIS — I160 Hypertensive urgency: Secondary | ICD-10-CM | POA: Diagnosis not present

## 2017-04-07 DIAGNOSIS — E861 Hypovolemia: Secondary | ICD-10-CM | POA: Diagnosis not present

## 2017-04-07 DIAGNOSIS — Z992 Dependence on renal dialysis: Secondary | ICD-10-CM | POA: Diagnosis not present

## 2017-04-07 DIAGNOSIS — E1122 Type 2 diabetes mellitus with diabetic chronic kidney disease: Secondary | ICD-10-CM | POA: Diagnosis not present

## 2017-04-07 DIAGNOSIS — D631 Anemia in chronic kidney disease: Secondary | ICD-10-CM | POA: Diagnosis not present

## 2017-04-07 DIAGNOSIS — F0390 Unspecified dementia without behavioral disturbance: Secondary | ICD-10-CM | POA: Diagnosis not present

## 2017-04-07 DIAGNOSIS — N186 End stage renal disease: Secondary | ICD-10-CM | POA: Diagnosis not present

## 2017-04-07 DIAGNOSIS — J449 Chronic obstructive pulmonary disease, unspecified: Secondary | ICD-10-CM | POA: Diagnosis not present

## 2017-04-07 DIAGNOSIS — I120 Hypertensive chronic kidney disease with stage 5 chronic kidney disease or end stage renal disease: Secondary | ICD-10-CM | POA: Diagnosis not present

## 2017-04-07 DIAGNOSIS — I509 Heart failure, unspecified: Secondary | ICD-10-CM | POA: Diagnosis not present

## 2017-04-07 LAB — CBC
HEMATOCRIT: 30.1 % — AB (ref 36.0–46.0)
Hemoglobin: 9.7 g/dL — ABNORMAL LOW (ref 12.0–15.0)
MCH: 25.9 pg — AB (ref 26.0–34.0)
MCHC: 32.2 g/dL (ref 30.0–36.0)
MCV: 80.3 fL (ref 78.0–100.0)
PLATELETS: 130 10*3/uL — AB (ref 150–400)
RBC: 3.75 MIL/uL — ABNORMAL LOW (ref 3.87–5.11)
RDW: 15 % (ref 11.5–15.5)
WBC: 5.7 10*3/uL (ref 4.0–10.5)

## 2017-04-07 LAB — GLUCOSE, CAPILLARY
Glucose-Capillary: 93 mg/dL (ref 65–99)
Glucose-Capillary: 99 mg/dL (ref 65–99)
Glucose-Capillary: 78 mg/dL (ref 65–99)
Glucose-Capillary: 148 mg/dL — ABNORMAL HIGH (ref 65–99)
Glucose-Capillary: 90 mg/dL (ref 65–99)

## 2017-04-07 LAB — RENAL FUNCTION PANEL
ANION GAP: 11 (ref 5–15)
Albumin: 2.9 g/dL — ABNORMAL LOW (ref 3.5–5.0)
BUN: 45 mg/dL — ABNORMAL HIGH (ref 6–20)
CHLORIDE: 99 mmol/L — AB (ref 101–111)
CO2: 26 mmol/L (ref 22–32)
Calcium: 8.2 mg/dL — ABNORMAL LOW (ref 8.9–10.3)
Creatinine, Ser: 8.13 mg/dL — ABNORMAL HIGH (ref 0.44–1.00)
GFR, EST AFRICAN AMERICAN: 4 mL/min — AB (ref >60)
GFR, EST NON AFRICAN AMERICAN: 4 mL/min — AB (ref >60)
Glucose, Bld: 176 mg/dL — ABNORMAL HIGH (ref 65–99)
Phosphorus: 5.2 mg/dL — ABNORMAL HIGH (ref 2.5–4.6)
Potassium: 4.9 mmol/L (ref 3.5–5.1)
Sodium: 136 mmol/L (ref 135–145)

## 2017-04-07 MED ORDER — TRAMADOL HCL 50 MG PO TABS: 50.0000 mg | ORAL_TABLET | Freq: Two times a day (BID) | ORAL | 0 refills | Status: DC | PRN

## 2017-04-07 NOTE — Clinical Social Work Note (Signed)
Clinical Social Work Assessment  Patient Details  Name: [name] MRN: [id number] Date of Birth: 11/08/1925  Date of referral:  04/07/17               Reason for consult:  Discharge Planning                Permission sought to share information with:  Facility Contact Representative, Family Supports Permission granted to share information::  Yes, Verbal Permission Granted  Name::        Agency::  [facility]  Relationship::     Contact Information:     Housing/Transportation Living arrangements for the past 2 months:  Assisted Living Facility Source of Information:  Patient, Medical Team(Family at bedside) Patient Interpreter Needed:  None Criminal Activity/Legal Involvement Pertinent to Current Situation/Hospitalization:  No - Comment as needed Significant Relationships:  Adult Children, Other Family Members Lives with:  Facility Resident Do you feel safe going back to the place where you live?  Yes Need for family participation in patient care:  Yes (Comment)  Care giving concerns:  Patient is a resident at [facility].   Social Worker assessment / plan:  CSW met with patient. Multiple family members at bedside. CSW introduced role and explained that discharge planning would be discussed. Patient and family confirmed she is from [facility] and plans to return today. Patient's family will transport and have an oxygen tank for her in the car. Facility will need to review discharge summary and FL2 before she can return. No further concerns. CSW encouraged patient and her family to contact CSW as needed. CSW will continue to follow patient and her family for support and facilitate discharge to ALF today.  Employment status:  Retired Insurance information:  Managed Medicare PT Recommendations:  Not assessed at this time Information / Referral to community resources:  Other (Comment Required)(Plan is to return to ALF)  Patient/Family's Response to care:   Patient and her family agreeable to return to ALF today. Patient's family supportive and involved in patient's care. Patient and her family appreciated social work intervention.  Patient/Family's Understanding of and Emotional Response to Diagnosis, Current Treatment, and Prognosis:  Patient and her family have a good understanding of the reason for admission and plan for return to ALF today. Patient and her family appear happy with hospital care.  Emotional Assessment Appearance:  Appears stated age Attitude/Demeanor/Rapport:  Engaged, Gracious Affect (typically observed):  Accepting, Appropriate, Calm, Pleasant Orientation:  Oriented to Self, Oriented to Place, Oriented to  Time, Oriented to Situation Alcohol / Substance use:  Never Used Psych involvement (Current and /or in the community):  No (Comment)  Discharge Needs  Concerns to be addressed:  Care Coordination Readmission within the last 30 days:  No Current discharge risk:  None Barriers to Discharge:  Other(Facility will need to review discharge summary and FL2.)    C , LCSW 04/07/2017, 12:08 PM  

## 2017-04-07 NOTE — Discharge Summary (Addendum)
[facility] Discharge Summary   Patient: [name] PYK:[id number]   PCP: Alycia Rossetti, MD DOB: Jun 05, 1925   Date of admission: 04/05/2017   Date of discharge:  04/07/2017    Discharge Diagnoses:  Principal Problem:   ESRD needing dialysis [facility]) Active Problems:   Diabetes mellitus with renal complications ([place])   Anxiety   Chronic respiratory failure ([place])   Hypothyroidism   Depression   Essential hypertension   Anemia in chronic kidney disease (CKD)   Hypertensive urgency   Hyperkalemia   Problem with vascular access   Admitted From: ALF Disposition:  ALF  Recommendations for Outpatient Follow-up:  1. Please follow up with PCP and maintain HD schedule    Follow-up Information    Alycia Rossetti, MD. Go on 04/12/2017.   Specialty:  Family Medicine Why:  @2pm  Contact information: [address] [place] [address] [phone]          Diet recommendation: renal diet  Activity: The patient is advised to gradually reintroduce usual activities.  Discharge Condition: good  Code Status: DNR DNI  History of present illness: As per the H and P dictated on admission, "[name] is a 84 y.o. female with medical history significant for hypertension, diabetes mellitus now diet-controlled, hypothyroidism, COPD with chronic hypoxic respiratory failure, depression with anxiety,  and ESRD on HD, admitted for hyperkalemia, hypovolemia, and hypertensive urgency in the setting of ESRD status problem and missed dialysis.  Patient reports completing her dialysis on 04/01/2017, but has since been unable to dialyze secondary to a problem with her left upper extremity access.  She presented today for placement of a dialysis catheter with vascular surgery, but preoperative blood work revealed potassium of 6.6 and the procedure was canceled.  She was treated with 30 g of Kayexalate and albuterol neb.  EKG was reassuring.  Nephrology was contacted, will plan to  dialyze patient tomorrow after the catheter was placed, and a patient was requested.  Patient remains hypertensive, slightly dyspneic but in no acute distress on her supplemental oxygen, and she will be admitted to the telemetry unit for ongoing evaluation and management of hyperkalemia, hypertensive urgency, and anemia in setting of missed dialysis due to access problems."  Hospital Course:  Summary of her active problems in the hospital is as following. 1.ESRD with access problem -Patient typically dialyzes MWF, reports completing session on 04/01/17, but unable to dialyze since d/t problem with her access in LUE -She presented for [facility] placement, but procedure cancelled due to critical hyperkalemia -She has been treated with temporizing measures for hyperkalemia -Nephrology and vascular surgery consulting -Underwent HD, acute post [facility] placement and tolerating it very well.  2.Hyperkalemia -Potassium was 6.6 on admission, after temporizing changes, Kayexalate and albuterol neb went down to 5.6.  Reached up again at 6.1. Remained stable after HD - EKG without significant changes  3.Hypertension with hypertensive urgency -BP 220/100 on admission -Secondary to hypervolemia and anticipate improvement with HD -Continue diltiazem  4.COPD; chronic hypoxic respiratory failure -No wheezing of obstructive breathing -Mild dyspnea with speech likely related to hypervolemia -Continue prn albuterol, supplemental O2  5.Hypothyroidism -Continue Synthroid  6.Depression with anxiety -Stable -Continue Celexa and Remeron  7.Anemia -Hgb is 9.7 on admission, similar to priors, stable now -No bleeding, likely secondary to CKD   8. Hx of DM; hyperglycemia  - Has documented hx of DM, but mostrecent A1c was 5.3% and she is no longer on diabetes medications   All  other chronic medical condition were stable during the hospitalization.  Patient  was ambulatory without any assistance. ALF transfer was arranged by Education officer, museum and case Freight forwarder. On the day of the discharge the patient's vitals were stable , and no other acute medical condition were reported by patient. the patient was felt safe to be discharge at ALF with therapy.  Procedures and Results:  HD  [facility] placement  Consultations:  Vascular surgery  Nephrology  DISCHARGE MEDICATION: Allergies as of 04/07/2017   No Known Allergies     Medication List    TAKE these medications   albuterol (2.5 MG/3ML) 0.083% nebulizer solution Commonly known as:  PROVENTIL USE 1 VIAL IN NEBULIZER 3 TIMES DAILY AS NEEDED FOR WHEEZING/SHORTNESS OF BREATH.   VENTOLIN HFA 108 (90 Base) MCG/ACT inhaler Generic drug:  albuterol INHALE (2) PUFFS EVERY 4 HOURS AS NEEDED FOR WHEEZING OR SHORTNESS OFBREATH(COUGH).   ARTIFICIAL TEARS 1.4 % ophthalmic solution Generic drug:  polyvinyl alcohol INSTILL 1 DROP INTO EITHER EYE UP TO 6 TIMES DAILY AS NEEDED FOR LUBRICATION   aspirin EC 81 MG tablet TAKE (1) TABLET BY MOUTH ONCE DAILY. What changed:  See the new instructions.   citalopram 20 MG tablet Commonly known as:  CELEXA TAKE (1) TABLET BY MOUTH ONCE IN THE MORNING. What changed:  See the new instructions.   diltiazem 180 MG 24 hr capsule Commonly known as:  CARDIZEM CD TAKE 1 CAPSULE BY MOUTH ONCE A DAY. What changed:    how much to take  how to take this  when to take this   docusate sodium 100 MG capsule Commonly known as:  COLACE TAKE 1 CAPSULE BY MOUTH TWICE DAILY. What changed:    how much to take  how to take this  when to take this   guaiFENesin-dextromethorphan 100-10 MG/5ML syrup Commonly known as:  ROBITUSSIN DM TAKE ONE TEASPOONFUL BY MOUTH EVERY 4 HOURS AS NEEDED FOR COUGH   ipratropium 0.02 % nebulizer solution Commonly known as:  ATROVENT USE 1 VIAL IN NEBULIZER 3 TIMES DAILY AS NEEDED FOR WHEEZING/SHORTNESS OF BREATH.   isosorbide  mononitrate 60 MG 24 hr tablet Commonly known as:  IMDUR TAKE (1) TABLET BY MOUTH ONCE DAILY. What changed:  See the new instructions.   levothyroxine 25 MCG tablet Commonly known as:  SYNTHROID, LEVOTHROID TAKE (1) TABLET BY MOUTH ONCE DAILY BEFORE BREAKFAST. What changed:  See the new instructions.   lidocaine-prilocaine cream Commonly known as:  EMLA APPLY TOPICALLY TO LEFT UPPER ARM 1 HOUR PRIOR TO DIALYSIS ON MONDAY,WEDNESDAY & FRIDAYS AS NEEDED FOR PAIN. WRAP WITH PLASTIC WRAP AFTER A   mirtazapine 15 MG tablet Commonly known as:  REMERON TAKE 1/2 TABLET (7.5MG ) BY MOUTH AT BEDTIME FOR MOOD/APPETITE.   nitroGLYCERIN 0.4 MG SL tablet Commonly known as:  NITROSTAT DISSOLVE 1 TABLET UNDER THE TONGUE EVERY 5 MINUTES X2 DOSES THEN TO ER   nystatin powder Commonly known as:  MYCOSTATIN/NYSTOP Apply 4 (four) times daily as needed topically. To groin/ abd folds for irritation   OXYGEN Inhale 2 L into the lungs continuous.   pantoprazole 40 MG tablet Commonly known as:  PROTONIX TAKE (1) TABLET BY MOUTH ONCE DAILY. What changed:  See the new instructions.   polyethylene glycol powder powder Commonly known as:  MIRALAX Take 17 g by mouth as needed for mild constipation. Hold for diarrhea What changed:    when to take this  additional instructions   SENSIPAR 30 MG tablet Generic drug:  cinacalcet TAKE 1 TABLET BY MOUTH ONCE DAILY AFTER THE EVENING MEAL *PROVIDED BYDAVITA DIALYSIS* What changed:  See the new instructions.   sevelamer carbonate 800 MG tablet Commonly known as:  RENVELA Take 800-2,400 mg by mouth See admin instructions. Take 2400 mg by mouth three times daily with meals and take 800 mg by mouth once daily with snacks   TAB-A-VITE PO Take 1 tablet by mouth daily.   traMADol 50 MG tablet Commonly known as:  ULTRAM Take 1 tablet (50 mg total) by mouth every 12 (twelve) hours as needed for moderate pain.      No Known Allergies Discharge Instructions     Diet renal [id number]   Complete by:  As directed    Discharge instructions   Complete by:  As directed    It is important that you read following instructions as well as go over your medication list with RN to help you understand your care after this hospitalization.  Discharge Instructions: Please follow-up with PCP in one week  Please request your primary care physician to go over all Hospital Tests and Procedure/Radiological results at the follow up,  Please get all Hospital records sent to your PCP by signing hospital release before you go home.   Do not take more than prescribed Pain, Sleep and Anxiety Medications. You were cared for by a hospitalist during your hospital stay. If you have any questions about your discharge medications or the care you received while you were in the hospital after you are discharged, you can call the unit and ask to speak with the hospitalist on call if the hospitalist that took care of you is not available.  Once you are discharged, your primary care physician will handle any further medical issues. Please note that NO REFILLS for any discharge medications will be authorized once you are discharged, as it is imperative that you return to your primary care physician (or establish a relationship with a primary care physician if you do not have one) for your aftercare needs so that they can reassess your need for medications and monitor your lab values. You Must read complete instructions/literature along with all the possible adverse reactions/side effects for all the Medicines you take and that have been prescribed to you. Take any new Medicines after you have completely understood and accept all the possible adverse reactions/side effects. Wear Seat belts while driving. If you have smoked or chewed Tobacco in the last 2 yrs please stop smoking and/or stop any Recreational drug use.   Increase activity slowly   Complete by:  As directed       Discharge Exam: Filed Weights   04/06/17 1351 04/06/17 1733 04/07/17 0444  Weight: 79.3 kg (174 lb 13.2 oz) 76.4 kg (168 lb 6.9 oz) 76.2 kg (167 lb 15.9 oz)   Vitals:   04/07/17 1027 04/07/17 1130  BP: (!) 178/72 (!) 187/90  Pulse: 68 70  Resp:  18  Temp:  98.7 F (37.1 C)  SpO2:  100%   General: Appear in no distress, no Rash; Oral Mucosa moist. Cardiovascular: S1 and S2 Present, no Murmur, no JVD Respiratory: Bilateral Air entry present and Clear to Auscultation, no Crackles, no wheezes Abdomen: Bowel Sound present, Soft and no tenderness Extremities: no Pedal edema, no calf tenderness Neurology: Grossly no focal neuro deficit.  The results of significant diagnostics from this hospitalization (including imaging, microbiology, ancillary and laboratory) are listed below for reference.    Significant Diagnostic Studies: Dg Chest 1  View  Result Date: 04/06/2017 CLINICAL DATA:  Central line placement EXAM: CHEST  1 VIEW COMPARISON:  03/28/2017 FINDINGS: Right dialysis catheter has been placed with the tip in the upper right atrium. No pneumothorax. Cardiomegaly, vascular congestion. Bibasilar atelectasis. Suspect small effusions. IMPRESSION: Right dialysis catheter tip in the upper right atrium. No pneumothorax. Cardiomegaly, vascular congestion. Bibasilar atelectasis and small effusions. Electronically Signed   By: Rolm Baptise M.D.   On: 04/06/2017 10:52    Microbiology: Recent Results (from the past 240 hour(s))  Surgical pcr screen     Status: None   Collection Time: 04/05/17  1:09 PM  Result Value Ref Range Status   MRSA, PCR NEGATIVE NEGATIVE Final   Staphylococcus aureus NEGATIVE NEGATIVE Final    Comment: (NOTE) The Xpert SA Assay (FDA approved for NASAL specimens in patients 12 years of age and older), is one component of a comprehensive surveillance program. It is not intended to diagnose infection nor to guide or monitor treatment. Performed at [facility], [place] [address] [place], [place] [address]      Labs: CBC: Recent Labs  Lab 04/05/17 1803 04/06/17 0527 04/06/17 0830 04/06/17 0857 04/07/17 0546  WBC 6.0 7.0  --   --  5.7  HGB 9.7* 9.5* 9.9* 9.9* 9.7*  HCT 30.5* 29.1* 29.0* 29.0* 30.1*  MCV 79.4 79.3  --   --  80.3  PLT 156 138*  --   --  992*   Basic Metabolic Panel: Recent Labs  Lab 04/05/17 1825 04/05/17 1830  04/06/17 0527 04/06/17 0830 04/06/17 0857 04/06/17 1146 04/06/17 1650 04/07/17 1009  NA 138 139  --  140 134* 137  --   --  136  K 5.6* 5.7*   < > 6.1* 6.0* 5.9* 5.6* 3.8 4.9  CL 96* 97*  --  97*  --   --   --   --  99*  CO2 24 26  --  28  --   --   --   --  26  GLUCOSE 195* 192*  --  76 82 84  --   --  176*  BUN 81* 81*  --  87*  --   --   --   --  45*  CREATININE 11.03* 10.93*  --  11.56*  --   --   --   --  8.13*  CALCIUM 8.6* 8.6*  --  8.7*  --   --   --   --  8.2*  PHOS  --  4.9*  --   --   --   --   --   --  5.2*   < > = values in this interval not displayed.   Liver Function Tests: Recent Labs  Lab 04/05/17 1830 04/07/17 1009  ALBUMIN 3.5 2.9*   BNP (last 3 results) Recent Labs    04/22/16 1225 08/10/16 1751  BNP 1,004.0* 3,081.1*   CBG: Recent Labs  Lab 04/06/17 2201 04/07/17 0131 04/07/17 0446 04/07/17 0732 04/07/17 1117  GLUCAP 155* 93 99 78 148*   Time spent: 35 minutes  Signed:  Berle Mull  [facility]  04/07/2017  , 12:59 PM

## 2017-04-07 NOTE — Clinical Social Work Note (Addendum)
CSW sent discharge summary and FL2 to ALF. They will call once reviewed to determine if any changes need to be made or if patient can transport back to the facility.  Dayton Scrape, [place] [phone]  2:35 pm CSW left message for ALF RN.  Dayton Scrape, [place] [phone]  3:11 pm Patient was not on Nepro prior to admission. MD removed from discharge summary. Patient was on oxygen continuously prior to admission so ALF wants it to be changed to that from prn. CSW paged MD to notify.  Dayton Scrape, [place] [phone]  3:46 pm ALF wants Miralax to say "as needed once daily." Paged MD.  Dayton Scrape, [place]

## 2017-04-07 NOTE — NC FL2 (Addendum)
[place] MEDICAID FL2 LEVEL OF CARE SCREENING TOOL     IDENTIFICATION  Patient Name: [name] Birthdate: 1925-07-09 Sex: female Admission Date (Current Location): 04/05/2017  [facility] and [place] Number:  [employer] and Address:  [facility], [place] [address] [place], [place] [address]      Provider Number: [id number]  Attending Physician Name and Address:  Lavina Hamman, MD  Relative Name and Phone Number:       Current Level of Care: Hospital Recommended Level of Care: [place] Prior Approval Number:    Date Approved/Denied:   PASRR Number:    Discharge Plan: Other (Comment)(ALF)    Current Diagnoses: Patient Active Problem List   Diagnosis Date Noted  . Problem with vascular access 04/07/2017  . ESRD needing dialysis ([place]) 04/05/2017  . Hypertensive urgency 04/05/2017  . Hyperkalemia 04/05/2017  . Anemia in chronic kidney disease (CKD) 12/30/2016  . Pancreatic cyst 08/31/2016  . COPD with acute exacerbation ([place]) 08/11/2016  . Essential hypertension 08/11/2016  . Chronic pain 08/11/2016  . Esophageal dysmotility 06/01/2016  . Dysphagia   . Chronic diastolic heart failure ([place]) 10/07/2015  . Fluid overload 10/02/2015  . Hyperglycemia 10/02/2015  . Depression   . Pseudoaneurysm of arteriovenous dialysis fistula ([place]) 05/29/2015  . Traumatic hematoma of scalp 02/12/2014  . Decreased hearing of both ears 02/12/2014  . At high risk for falls 02/12/2014  . Leg swelling 09/18/2013  . OA (osteoarthritis) of knee 09/18/2013  . MDD (major depressive disorder) ([place]) 04/05/2013  . Mechanical complication of other vascular device, implant, and graft 11/14/2012  . Pain in limb-Left arm 07/11/2012  . Hypothyroidism 06/08/2012  . GERD (gastroesophageal reflux disease) 03/23/2012  . Gait instability 01/13/2012  . Neuropathy of hand 01/13/2012  . Breast mass, right 07/11/2011  . Hemorrhoid 03/14/2011  . ESRD  (end stage renal disease) on dialysis ([place]) 02/12/2011  . Dyspnea 01/17/2011  . Chronic respiratory failure ([place]) 01/13/2011  . Chronic constipation 12/09/2010  . Insomnia 10/14/2010  . Diabetes mellitus with renal complications ([place]) 82/50/5397  . ALLERGIC RHINITIS 04/03/2008  . DEPENDENT EDEMA, LEGS, BILATERAL 11/17/2006  . HYPERLIPIDEMIA 10/10/2006  . Anemia in chronic kidney disease 10/10/2006  . Anxiety 09/05/2006  . CATARACT NOS 09/05/2006  . IBS 09/05/2006  . ARTHRITIS 09/05/2006    Orientation RESPIRATION BLADDER Height & Weight     Self, Time, Situation, Place  O2(Nasal Canula 2 L) Incontinent Weight: 167 lb 15.9 oz (76.2 kg) Height:  5\' 6"  (167.6 cm)  BEHAVIORAL SYMPTOMS/MOOD NEUROLOGICAL BOWEL NUTRITION STATUS  (None) (None) Incontinent Diet(Renal)  AMBULATORY STATUS COMMUNICATION OF NEEDS Skin     Verbally Bruising, Surgical wounds                       Personal Care Assistance Level of Assistance              Functional Limitations Info  Sight, Hearing, Speech Sight Info: Adequate Hearing Info: Adequate Speech Info: Adequate    SPECIAL CARE FACTORS FREQUENCY  Blood pressure                    Contractures Contractures Info: Not present    Additional Factors Info  Code Status, Allergies, Psychotropic Code Status Info: DNR Allergies Info: NKDA Psychotropic Info: Anxiety, depression: Celexa 20 mg PO daily, Remeron 7.5 mg PO QHS.         Current Medications (04/07/2017):  This  is the current hospital active medication list Current Facility-Administered Medications  Medication Dose Route Frequency Provider Last Rate Last Dose  . 0.9 %  sodium chloride infusion  250 mL Intravenous PRN Serafina Mitchell, MD      . 0.9 %  sodium chloride infusion   Intravenous Continuous Serafina Mitchell, MD 10 mL/hr at 04/06/17 0809    . acetaminophen (TYLENOL) tablet 650 mg  650 mg Oral Q6H PRN Serafina Mitchell, MD       Or  . acetaminophen (TYLENOL)  suppository 650 mg  650 mg Rectal Q6H PRN Serafina Mitchell, MD      . albuterol (PROVENTIL) (2.5 MG/3ML) 0.083% nebulizer solution 2.5 mg  2.5 mg Nebulization Q4H PRN Serafina Mitchell, MD      . aspirin EC tablet 81 mg  81 mg Oral Daily Serafina Mitchell, MD   81 mg at 04/07/17 1029  . cinacalcet (SENSIPAR) tablet 30 mg  30 mg Oral q1800 Serafina Mitchell, MD   30 mg at 04/06/17 1815  . citalopram (CELEXA) tablet 20 mg  20 mg Oral Daily Serafina Mitchell, MD   20 mg at 04/07/17 1028  . diltiazem (CARDIZEM CD) 24 hr capsule 180 mg  180 mg Oral Daily Serafina Mitchell, MD   180 mg at 04/07/17 1028  . docusate sodium (COLACE) capsule 100 mg  100 mg Oral BID Serafina Mitchell, MD   100 mg at 04/07/17 1027  . feeding supplement (NEPRO CARB STEADY) liquid 237 mL  237 mL Oral TID PRN Serafina Mitchell, MD      . guaiFENesin-dextromethorphan (ROBITUSSIN DM) 100-10 MG/5ML syrup 5 mL  5 mL Oral Q6H PRN Serafina Mitchell, MD      . hydrALAZINE (APRESOLINE) injection 10 mg  10 mg Intravenous Q4H PRN Serafina Mitchell, MD   10 mg at 04/06/17 0028  . insulin aspart (novoLOG) injection 0-9 Units  0-9 Units Subcutaneous Q4H Serafina Mitchell, MD   2 Units at 04/06/17 2235  . isosorbide mononitrate (IMDUR) 24 hr tablet 60 mg  60 mg Oral Daily Serafina Mitchell, MD   60 mg at 04/07/17 1029  . levothyroxine (SYNTHROID, LEVOTHROID) tablet 25 mcg  25 mcg Oral QAC breakfast Serafina Mitchell, MD   25 mcg at 04/07/17 [phone]  . mirtazapine (REMERON) tablet 7.5 mg  7.5 mg Oral QHS Serafina Mitchell, MD   7.5 mg at 04/06/17 2242  . ondansetron (ZOFRAN) tablet 4 mg  4 mg Oral Q6H PRN Serafina Mitchell, MD       Or  . ondansetron [facility]) injection 4 mg  4 mg Intravenous Q6H PRN Serafina Mitchell, MD      . pantoprazole (PROTONIX) EC tablet 40 mg  40 mg Oral Daily Serafina Mitchell, MD   40 mg at 04/07/17 1029  . polyethylene glycol (MIRALAX / GLYCOLAX) packet 17 g  17 g Oral Daily PRN Serafina Mitchell, MD      . polyvinyl alcohol (LIQUIFILM  TEARS) 1.4 % ophthalmic solution 1 drop  1 drop Both Eyes PRN Serafina Mitchell, MD      . sevelamer carbonate (RENVELA) tablet 2,400 mg  2,400 mg Oral TID WC Serafina Mitchell, MD   2,400 mg at 04/07/17 1028  . sodium chloride flush (NS) 0.9 % injection 3 mL  3 mL Intravenous Q12H Serafina Mitchell, MD   3 mL at 04/07/17 1030  . sodium chloride  flush (NS) 0.9 % injection 3 mL  3 mL Intravenous Q12H Serafina Mitchell, MD   3 mL at 04/07/17 1030  . sodium chloride flush (NS) 0.9 % injection 3 mL  3 mL Intravenous PRN Serafina Mitchell, MD      . traMADol Veatrice Bourbon) tablet 50-100 mg  50-100 mg Oral Q6H PRN Serafina Mitchell, MD   100 mg at 04/06/17 2244     Discharge Medications: TAKE these medications   albuterol (2.5 MG/3ML) 0.083% nebulizer solution Commonly known as:  PROVENTIL USE 1 VIAL IN NEBULIZER 3 TIMES DAILY AS NEEDED FOR WHEEZING/SHORTNESS OF BREATH.   VENTOLIN HFA 108 (90 Base) MCG/ACT inhaler Generic drug:  albuterol INHALE (2) PUFFS EVERY 4 HOURS AS NEEDED FOR WHEEZING OR SHORTNESS OFBREATH(COUGH).   ARTIFICIAL TEARS 1.4 % ophthalmic solution Generic drug:  polyvinyl alcohol INSTILL 1 DROP INTO EITHER EYE UP TO 6 TIMES DAILY AS NEEDED FOR LUBRICATION   aspirin EC 81 MG tablet TAKE (1) TABLET BY MOUTH ONCE DAILY. What changed:  See the new instructions.   citalopram 20 MG tablet Commonly known as:  CELEXA TAKE (1) TABLET BY MOUTH ONCE IN THE MORNING. What changed:  See the new instructions.   diltiazem 180 MG 24 hr capsule Commonly known as:  CARDIZEM CD TAKE 1 CAPSULE BY MOUTH ONCE A DAY. What changed:    how much to take  how to take this  when to take this   docusate sodium 100 MG capsule Commonly known as:  COLACE TAKE 1 CAPSULE BY MOUTH TWICE DAILY. What changed:    how much to take  how to take this  when to take this   guaiFENesin-dextromethorphan 100-10 MG/5ML syrup Commonly known as:  ROBITUSSIN DM TAKE ONE TEASPOONFUL BY MOUTH EVERY 4 HOURS  AS NEEDED FOR COUGH   ipratropium 0.02 % nebulizer solution Commonly known as:  ATROVENT USE 1 VIAL IN NEBULIZER 3 TIMES DAILY AS NEEDED FOR WHEEZING/SHORTNESS OF BREATH.   isosorbide mononitrate 60 MG 24 hr tablet Commonly known as:  IMDUR TAKE (1) TABLET BY MOUTH ONCE DAILY. What changed:  See the new instructions.   levothyroxine 25 MCG tablet Commonly known as:  SYNTHROID, LEVOTHROID TAKE (1) TABLET BY MOUTH ONCE DAILY BEFORE BREAKFAST. What changed:  See the new instructions.   lidocaine-prilocaine cream Commonly known as:  EMLA APPLY TOPICALLY TO LEFT UPPER ARM 1 HOUR PRIOR TO DIALYSIS ON MONDAY,WEDNESDAY & FRIDAYS AS NEEDED FOR PAIN. WRAP WITH PLASTIC WRAP AFTER A   mirtazapine 15 MG tablet Commonly known as:  REMERON TAKE 1/2 TABLET (7.5MG ) BY MOUTH AT BEDTIME FOR MOOD/APPETITE.   nitroGLYCERIN 0.4 MG SL tablet Commonly known as:  NITROSTAT DISSOLVE 1 TABLET UNDER THE TONGUE EVERY 5 MINUTES X2 DOSES THEN TO ER   nystatin powder Commonly known as:  MYCOSTATIN/NYSTOP Apply 4 (four) times daily as needed topically. To groin/ abd folds for irritation   OXYGEN Inhale 2 L into the lungs continuous.   pantoprazole 40 MG tablet Commonly known as:  PROTONIX TAKE (1) TABLET BY MOUTH ONCE DAILY. What changed:  See the new instructions.   polyethylene glycol powder powder Commonly known as:  MIRALAX Take 17 g by mouth as needed for mild constipation. Hold for diarrhea What changed:    when to take this  additional instructions   SENSIPAR 30 MG tablet Generic drug:  cinacalcet TAKE 1 TABLET BY MOUTH ONCE DAILY AFTER THE EVENING MEAL *PROVIDED [facility]* What changed:  See the new  instructions.   sevelamer carbonate 800 MG tablet Commonly known as:  RENVELA Take 800-2,400 mg by mouth See admin instructions. Take 2400 mg by mouth three times daily with meals and take 800 mg by mouth once daily with snacks   TAB-A-VITE PO Take 1 tablet by mouth  daily.   traMADol 50 MG tablet Commonly known as:  ULTRAM Take 1 tablet (50 mg total) by mouth every 12 (twelve) hours as needed for moderate pain.       Relevant Imaging Results:  Relevant Lab Results:   Additional Information SS#: [id number]. HD [facility] MWF.  Candie Chroman, LCSW

## 2017-04-07 NOTE — Clinical Social Work Note (Signed)
CSW facilitated patient discharge including contacting patient family and facility to confirm patient discharge plans. Clinical information faxed to facility and family agreeable with plan. Patient's family will transport by car to [facility]. Family has an oxygen tank for the car. RN to call report prior to discharge [phone]).  CSW will sign off for now as social work intervention is no longer needed. Please consult [place] again if new needs arise.  Dayton Scrape, [place]

## 2017-04-07 NOTE — Care Management Important Message (Signed)
Important Message  Patient Details  Name: KINDELL STRADA MRN: [id number] Date of Birth: 12-01-1925   Medicare Important Message Given:  Yes    Erenest Rasher, RN 04/07/2017, 10:42 AM

## 2017-04-07 NOTE — Care Management Note (Signed)
Case Management Note  Patient Details  Name: RHANDA LEMIRE MRN: [id number] Date of Birth: Aug 01, 1925  Subjective/Objective:   ESRD- access problem, TDC placement, hyperkalemia                 Action/Plan: NCM spoke to pt and she has oxygen at [facility]F. CSW working to [employer] back to ALF.    Expected Discharge Date:  04/08/17               Expected Discharge Plan:  Assisted Living / Rest Home  In-House Referral:  Clinical Social Work  Discharge planning Services  CM Consult  Post Acute Care Choice:  NA Choice offered to:  NA  DME Arranged:  N/A DME Agency:  NA  HH Arranged:  NA HH Agency:  NA  Status of Service:  Completed, signed off  If discussed at [employer] of Stay Meetings, dates discussed:    Additional Comments:  Erenest Rasher, RN 04/07/2017, 10:44 AM

## 2017-04-07 NOTE — Progress Notes (Signed)
[facility] Progress Note   84 y.o. female with medical history significant forhypertension, diabetes mellitusnowdiet-controlled,hypothyroidism, COPD with chronic hypoxic respiratory failure, depression with anxiety, and ESRD  admitted for hyperkalemia, hypovolemia, and hypertensive urgency in the setting of access issues. She had a RIJ TC placed and the thrombosed left BCF was deemed not salvageable.   Assessment/ Plan:   1. ESRD w/ hyperkalemia - last dialysis was last Friday but likely had significant recirculation. - Tolerated HD yesterday with no issues with 2.6L UF.  - OK from renal standpoint to discharge and resume outpt MWF regimen @  [facility].  2. HTN - restart home medications; UF on dialysis will help as well. We will try to weight the pt after dialysis today. 2. DM 3. Anemia - stable 4. Hypothyroid    Subjective:   Denies f/c/v/n/dyspnea.   Objective:   BP (!) 152/61 (BP Location: Right Arm)   Pulse 68   Temp 98 F (36.7 C) (Oral)   Resp 18   Ht 5\' 6"  (1.676 m)   Wt 76.2 kg (167 lb 15.9 oz)   LMP 12/24/2010   SpO2 100%   BMI 27.11 kg/m   Intake/Output Summary (Last 24 hours) at 04/07/2017 0947 Last data filed at 04/07/2017 6378 Gross per 24 hour  Intake 670 ml  Output 2124 ml  Net -1454 ml   Weight change: -1.18 kg (-2 lb 9.6 oz)  Physical Exam: General appearance: alert and cooperative Resp: rales bibasilar Cardio: regular rate and rhythm, S1, S2 normal, no murmur, click, rub or gallop GI: soft, non-tender; bowel sounds normal; no masses,  no organomegaly Extremities: edema trace Pulses: 2+ and symmetric Skin: Skin color, texture, turgor normal. No rashes or lesions ACCESS: RIJ TC, thrombosed lt BCF   Imaging: Dg Chest 1 View  Result Date: 04/06/2017 CLINICAL DATA:  Central line placement EXAM: CHEST  1 VIEW COMPARISON:  03/28/2017 FINDINGS: Right dialysis catheter has been placed with the tip in the upper right atrium.  No pneumothorax. Cardiomegaly, vascular congestion. Bibasilar atelectasis. Suspect small effusions. IMPRESSION: Right dialysis catheter tip in the upper right atrium. No pneumothorax. Cardiomegaly, vascular congestion. Bibasilar atelectasis and small effusions. Electronically Signed   By: Rolm Baptise M.D.   On: 04/06/2017 10:52    Labs: BMET Recent Labs  Lab 04/05/17 1301  04/05/17 1825 04/05/17 1830 04/05/17 2158 04/06/17 0123 04/06/17 0527 04/06/17 0830 04/06/17 0857 04/06/17 1146 04/06/17 1650  NA 136  --  138 139  --   --  140 134* 137  --   --   K 6.3*   < > 5.6* 5.7* 5.5* 5.1 6.1* 6.0* 5.9* 5.6* 3.8  CL  --   --  96* 97*  --   --  97*  --   --   --   --   CO2  --   --  24 26  --   --  28  --   --   --   --   GLUCOSE 116*  --  195* 192*  --   --  76 82 84  --   --   BUN  --   --  81* 81*  --   --  87*  --   --   --   --   CREATININE  --   --  11.03* 10.93*  --   --  11.56*  --   --   --   --   CALCIUM  --   --  8.6* 8.6*  --   --  8.7*  --   --   --   --   PHOS  --   --   --  4.9*  --   --   --   --   --   --   --    < > = values in this interval not displayed.   CBC Recent Labs  Lab 04/05/17 1803 04/06/17 0527 04/06/17 0830 04/06/17 0857 04/07/17 0546  WBC 6.0 7.0  --   --  5.7  HGB 9.7* 9.5* 9.9* 9.9* 9.7*  HCT 30.5* 29.1* 29.0* 29.0* 30.1*  MCV 79.4 79.3  --   --  80.3  PLT 156 138*  --   --  130*    Medications:    . aspirin EC  81 mg Oral Daily  . cinacalcet  30 mg Oral q1800  . citalopram  20 mg Oral Daily  . diltiazem  180 mg Oral Daily  . docusate sodium  100 mg Oral BID  . insulin aspart  0-9 Units Subcutaneous Q4H  . isosorbide mononitrate  60 mg Oral Daily  . levothyroxine  25 mcg Oral QAC breakfast  . mirtazapine  7.5 mg Oral QHS  . pantoprazole  40 mg Oral Daily  . sevelamer carbonate  2,400 mg Oral TID WC  . sodium chloride flush  3 mL Intravenous Q12H  . sodium chloride flush  3 mL Intravenous Q12H      Otelia Santee, MD 04/07/2017, 9:47  AM

## 2017-04-08 DIAGNOSIS — Z1159 Encounter for screening for other viral diseases: Secondary | ICD-10-CM | POA: Diagnosis not present

## 2017-04-08 DIAGNOSIS — Z79899 Other long term (current) drug therapy: Secondary | ICD-10-CM | POA: Diagnosis not present

## 2017-04-08 DIAGNOSIS — N186 End stage renal disease: Secondary | ICD-10-CM | POA: Diagnosis not present

## 2017-04-08 DIAGNOSIS — Z992 Dependence on renal dialysis: Secondary | ICD-10-CM | POA: Diagnosis not present

## 2017-04-08 DIAGNOSIS — E785 Hyperlipidemia, unspecified: Secondary | ICD-10-CM | POA: Diagnosis not present

## 2017-04-08 DIAGNOSIS — F0390 Unspecified dementia without behavioral disturbance: Secondary | ICD-10-CM | POA: Diagnosis not present

## 2017-04-08 DIAGNOSIS — E119 Type 2 diabetes mellitus without complications: Secondary | ICD-10-CM | POA: Diagnosis not present

## 2017-04-09 DIAGNOSIS — F0390 Unspecified dementia without behavioral disturbance: Secondary | ICD-10-CM | POA: Diagnosis not present

## 2017-04-10 DIAGNOSIS — F0390 Unspecified dementia without behavioral disturbance: Secondary | ICD-10-CM | POA: Diagnosis not present

## 2017-04-10 DIAGNOSIS — N186 End stage renal disease: Secondary | ICD-10-CM | POA: Diagnosis not present

## 2017-04-10 DIAGNOSIS — Z992 Dependence on renal dialysis: Secondary | ICD-10-CM | POA: Diagnosis not present

## 2017-04-11 DIAGNOSIS — F0390 Unspecified dementia without behavioral disturbance: Secondary | ICD-10-CM | POA: Diagnosis not present

## 2017-04-11 DIAGNOSIS — N186 End stage renal disease: Secondary | ICD-10-CM | POA: Diagnosis not present

## 2017-04-11 DIAGNOSIS — Z992 Dependence on renal dialysis: Secondary | ICD-10-CM | POA: Diagnosis not present

## 2017-04-11 DIAGNOSIS — R69 Illness, unspecified: Secondary | ICD-10-CM | POA: Diagnosis not present

## 2017-04-12 ENCOUNTER — Ambulatory Visit (INDEPENDENT_AMBULATORY_CARE_PROVIDER_SITE_OTHER): Payer: Medicare HMO | Admitting: Family Medicine

## 2017-04-12 ENCOUNTER — Encounter: Payer: Self-pay | Admitting: Family Medicine

## 2017-04-12 ENCOUNTER — Other Ambulatory Visit: Payer: Self-pay

## 2017-04-12 VITALS — BP 146/82 | HR 70 | Temp 98.9°F | Resp 16 | Ht 66.0 in | Wt 169.0 lb

## 2017-04-12 DIAGNOSIS — N186 End stage renal disease: Secondary | ICD-10-CM

## 2017-04-12 DIAGNOSIS — Z992 Dependence on renal dialysis: Secondary | ICD-10-CM

## 2017-04-12 DIAGNOSIS — D631 Anemia in chronic kidney disease: Secondary | ICD-10-CM

## 2017-04-12 DIAGNOSIS — I10 Essential (primary) hypertension: Secondary | ICD-10-CM | POA: Diagnosis not present

## 2017-04-12 DIAGNOSIS — F0390 Unspecified dementia without behavioral disturbance: Secondary | ICD-10-CM | POA: Diagnosis not present

## 2017-04-12 DIAGNOSIS — E875 Hyperkalemia: Secondary | ICD-10-CM

## 2017-04-12 NOTE — Patient Instructions (Signed)
Change f/u to 1st week of June  No change to medications

## 2017-04-12 NOTE — Assessment & Plan Note (Signed)
Controlled, no change to cardizem

## 2017-04-12 NOTE — Progress Notes (Signed)
   Subjective:    Patient ID: [name], female    DOB: December 17, 1925, 84 y.o.   MRN: [id number]  Patient presents for Hospital F/U (dialysis access clogged- moved to neck while in hospital)  Here for hospital follow-up she was admitted to the hospital secondary to difficulty with her dialysis access.  She was hospitalized secondary to placement of dialysis catheter however she was noted to have a very high potassium of 6.6 she was treated with Kayexalate and albuterol neb her EKG was reassuring.  Nephrology was consulted of course while in the hospital she was dialyzed after her catheter was placed.  Her blood pressure was slightly elevated but there was no change her medications thought to be secondary to the fluid overload from not having dialysis.  She was continued on her current dose of diltiazem.   Hyperkalemia this improved after her dialysis and the neb treatment her potassium at discharge was 4.9  COPD there was no flare of her COPD she was continued on her as needed albuterol and oxygen.  Hypothyroidism no changes Synthroid dose  chronic anemia of renal disease of multifactorial hemoglobin was at 9.7 close to her baseline no changes were made this was followed by nephrology.  Reviewed discharge with patient and daughter  Review Of Systems:  GEN- denies fatigue, fever, weight loss,weakness, recent illness HEENT- denies eye drainage, change in vision, nasal discharge, CVS- denies chest pain, palpitations RESP- denies SOB, cough, wheeze ABD- denies N/V, change in stools, abd pain GU- denies dysuria, hematuria, dribbling, incontinence MSK- denies joint pain, muscle aches, injury Neuro- denies headache, dizziness, syncope, seizure activity       Objective:    BP (!) 146/82   Pulse 70   Temp 98.9 F (37.2 C) (Oral)   Resp 16   Ht 5\' 6"  (1.676 m)   Wt 169 lb (76.7 kg)   LMP 12/24/2010   SpO2 94% Comment: on 2L/min via [place]  BMI 27.28 kg/m  GEN- NAD, alert and oriented  x3 HEENT-wearing sunglasses  pink conjunctiva, MMM, oropharynx clear Neck- Supple, no LAD CVS- RRR, 3/6 systolic murmur, has perm cath  RESP-CTAB ABD-NABS,soft,NT,ND EXT- No edema Pulses- Radial 2+        Assessment & Plan:      Problem List Items Addressed This Visit      Unprioritized   Hyperkalemia    Rechecked by Nephrology      Essential hypertension    Controlled, no change to cardizem      ESRD (end stage renal disease) on dialysis ([place])    Temporary access d/c/i      Anemia in chronic kidney disease (CKD) - Primary    Hb stable         Note: This dictation was prepared with Dragon dictation along with smaller phrase technology. Any transcriptional errors that result from this process are unintentional.

## 2017-04-12 NOTE — Assessment & Plan Note (Signed)
Hb stable.  ° ° °

## 2017-04-12 NOTE — Assessment & Plan Note (Signed)
Rechecked by Nephrology

## 2017-04-12 NOTE — Assessment & Plan Note (Signed)
Temporary access d/c/i

## 2017-04-13 ENCOUNTER — Telehealth: Payer: Self-pay | Admitting: *Deleted

## 2017-04-13 DIAGNOSIS — R69 Illness, unspecified: Secondary | ICD-10-CM | POA: Diagnosis not present

## 2017-04-13 DIAGNOSIS — N186 End stage renal disease: Secondary | ICD-10-CM | POA: Diagnosis not present

## 2017-04-13 DIAGNOSIS — F0390 Unspecified dementia without behavioral disturbance: Secondary | ICD-10-CM | POA: Diagnosis not present

## 2017-04-13 DIAGNOSIS — Z992 Dependence on renal dialysis: Secondary | ICD-10-CM | POA: Diagnosis not present

## 2017-04-13 NOTE — Telephone Encounter (Signed)
Received call from facility.   Reports that patient voiced c/o dizziness and BP has been high.   Call placed to facility to inquire. Was advised by Alyse Low that patient BOP noted elevated this AM at dialysis. States that she began to feel dizzy during dialysis and was hypotensive. Patient returned to facility and continued to feel somewhat dizzy. Denied SOB, chest pain. BP at 2pm noted at 167/ 102. BP at 5pm noted at 182/92. MD made aware. States that since BP has come down and no acute distress noted, continue to monitor as needed.

## 2017-04-14 ENCOUNTER — Encounter (HOSPITAL_COMMUNITY): Payer: Self-pay

## 2017-04-14 ENCOUNTER — Emergency Department (HOSPITAL_COMMUNITY): Payer: Medicare HMO

## 2017-04-14 ENCOUNTER — Other Ambulatory Visit: Payer: Self-pay

## 2017-04-14 ENCOUNTER — Emergency Department (HOSPITAL_COMMUNITY)
Admission: EM | Admit: 2017-04-14 | Discharge: 2017-04-14 | Disposition: A | Discharge status: Home / Self Care | Payer: Medicare HMO | Attending: Emergency Medicine | Admitting: Emergency Medicine

## 2017-04-14 DIAGNOSIS — R2681 Unsteadiness on feet: Secondary | ICD-10-CM | POA: Diagnosis not present

## 2017-04-14 DIAGNOSIS — E039 Hypothyroidism, unspecified: Secondary | ICD-10-CM | POA: Insufficient documentation

## 2017-04-14 DIAGNOSIS — I132 Hypertensive heart and chronic kidney disease with heart failure and with stage 5 chronic kidney disease, or end stage renal disease: Secondary | ICD-10-CM | POA: Diagnosis not present

## 2017-04-14 DIAGNOSIS — R296 Repeated falls: Secondary | ICD-10-CM | POA: Diagnosis not present

## 2017-04-14 DIAGNOSIS — E1122 Type 2 diabetes mellitus with diabetic chronic kidney disease: Secondary | ICD-10-CM | POA: Diagnosis not present

## 2017-04-14 DIAGNOSIS — R0989 Other specified symptoms and signs involving the circulatory and respiratory systems: Secondary | ICD-10-CM | POA: Diagnosis not present

## 2017-04-14 DIAGNOSIS — R279 Unspecified lack of coordination: Secondary | ICD-10-CM | POA: Diagnosis not present

## 2017-04-14 DIAGNOSIS — Z992 Dependence on renal dialysis: Principal | ICD-10-CM

## 2017-04-14 DIAGNOSIS — F0390 Unspecified dementia without behavioral disturbance: Secondary | ICD-10-CM | POA: Diagnosis not present

## 2017-04-14 DIAGNOSIS — Z7982 Long term (current) use of aspirin: Secondary | ICD-10-CM | POA: Insufficient documentation

## 2017-04-14 DIAGNOSIS — I509 Heart failure, unspecified: Secondary | ICD-10-CM | POA: Diagnosis not present

## 2017-04-14 DIAGNOSIS — N186 End stage renal disease: Secondary | ICD-10-CM

## 2017-04-14 DIAGNOSIS — R269 Unspecified abnormalities of gait and mobility: Secondary | ICD-10-CM | POA: Diagnosis not present

## 2017-04-14 DIAGNOSIS — R0602 Shortness of breath: Secondary | ICD-10-CM | POA: Diagnosis not present

## 2017-04-14 DIAGNOSIS — R531 Weakness: Secondary | ICD-10-CM | POA: Diagnosis not present

## 2017-04-14 DIAGNOSIS — R030 Elevated blood-pressure reading, without diagnosis of hypertension: Secondary | ICD-10-CM | POA: Diagnosis not present

## 2017-04-14 DIAGNOSIS — Z79899 Other long term (current) drug therapy: Secondary | ICD-10-CM | POA: Insufficient documentation

## 2017-04-14 DIAGNOSIS — Z743 Need for continuous supervision: Secondary | ICD-10-CM | POA: Diagnosis not present

## 2017-04-14 DIAGNOSIS — R5383 Other fatigue: Secondary | ICD-10-CM | POA: Diagnosis not present

## 2017-04-14 DIAGNOSIS — J449 Chronic obstructive pulmonary disease, unspecified: Secondary | ICD-10-CM | POA: Diagnosis not present

## 2017-04-14 DIAGNOSIS — J9610 Chronic respiratory failure, unspecified whether with hypoxia or hypercapnia: Secondary | ICD-10-CM | POA: Diagnosis not present

## 2017-04-14 DIAGNOSIS — I5032 Chronic diastolic (congestive) heart failure: Secondary | ICD-10-CM | POA: Diagnosis not present

## 2017-04-14 DIAGNOSIS — R42 Dizziness and giddiness: Secondary | ICD-10-CM | POA: Diagnosis not present

## 2017-04-14 LAB — CBC WITH DIFFERENTIAL/PLATELET
BASOS ABS: 0 10*3/uL (ref 0.0–0.1)
Basophils Relative: 0 %
Eosinophils Absolute: 0 10*3/uL (ref 0.0–0.7)
Eosinophils Relative: 1 %
HEMATOCRIT: 32.7 % — AB (ref 36.0–46.0)
Hemoglobin: 10.1 g/dL — ABNORMAL LOW (ref 12.0–15.0)
LYMPHS PCT: 17 %
Lymphs Abs: 1 10*3/uL (ref 0.7–4.0)
MCH: 25.1 pg — AB (ref 26.0–34.0)
MCHC: 30.9 g/dL (ref 30.0–36.0)
MCV: 81.3 fL (ref 78.0–100.0)
MONO ABS: 0.4 10*3/uL (ref 0.1–1.0)
Monocytes Relative: 7 %
NEUTROS ABS: 4.5 10*3/uL (ref 1.7–7.7)
Neutrophils Relative %: 75 %
Platelets: 201 10*3/uL (ref 150–400)
RBC: 4.02 MIL/uL (ref 3.87–5.11)
RDW: 14.2 % (ref 11.5–15.5)
WBC: 6 10*3/uL (ref 4.0–10.5)

## 2017-04-14 LAB — BASIC METABOLIC PANEL
ANION GAP: 13 (ref 5–15)
BUN: 35 mg/dL — ABNORMAL HIGH (ref 6–20)
CALCIUM: 8.9 mg/dL (ref 8.9–10.3)
CO2: 30 mmol/L (ref 22–32)
Chloride: 95 mmol/L — ABNORMAL LOW (ref 101–111)
Creatinine, Ser: 5.95 mg/dL — ABNORMAL HIGH (ref 0.44–1.00)
GFR calc Af Amer: 7 mL/min — ABNORMAL LOW (ref >60)
GFR calc non Af Amer: 6 mL/min — ABNORMAL LOW (ref >60)
GLUCOSE: 137 mg/dL — AB (ref 65–99)
POTASSIUM: 4.5 mmol/L (ref 3.5–5.1)
Sodium: 138 mmol/L (ref 135–145)

## 2017-04-14 LAB — TROPONIN I
Troponin I: 0.05 ng/mL (ref <0.03)
Troponin I: 0.05 ng/mL (ref <0.03)

## 2017-04-14 NOTE — ED Notes (Signed)
Date and time results received: 04/14/17 1340   Test: Troponin Critical Value: 0.05  Name of Provider Notified: Dr. Rogene Houston  Orders Received? Or Actions Taken?: None given at this time.

## 2017-04-14 NOTE — ED Notes (Signed)
Family unable to transport pt back to [facility] since patient require continuous oxygen therapy. EMS called for transport.

## 2017-04-14 NOTE — ED Notes (Signed)
EMS arrived to transport pt home.  

## 2017-04-14 NOTE — ED Notes (Signed)
Restricted extremity band placed on LT arm. 

## 2017-04-14 NOTE — Discharge Instructions (Addendum)
Return for any new or worse symptoms.  Today's workup without any acute findings.

## 2017-04-14 NOTE — ED Notes (Signed)
[facility] on the way to pick pt up to take back to [facility] in [place]

## 2017-04-14 NOTE — ED Triage Notes (Signed)
Pt is a resident of [facility] in [place].  EMS reports pt is a dialysis pt and last had dialysis yesterday.  EMS reports pt has residual r sided weakness from a stroke years ago but noticed worsening r arm weakness since 6am this morning.  EMS says grips were equal.  Reports lungs diminished in basis.  Pt alert and oriented.  Denies any pain.  Pt on 02 at 2liters continuously.  Pt also c/o high bp 201/116 and pt c/o dizziness.

## 2017-04-14 NOTE — ED Notes (Signed)
Pt returned from MRI °

## 2017-04-14 NOTE — ED Provider Notes (Signed)
[facility] Provider Note   CSN: [id number] Arrival date & time: 04/14/17  3086     History   Chief Complaint Chief Complaint  Patient presents with  . Weakness  . Hypertension    HPI [name] is a 84 y.o. female.  Patient from nursing facility.  Patient is dialysis patient normally dialyzed Monday Wednesdays and Fridays.  Was dialyzed yesterday.  Since that time she has had some generalized weakness and feeling fatigued.  Right sided weakness.  But felt there was some increased right arm weakness this morning at the nursing facility.  Patient has had CVA in the past that resulted in some associated with some dizziness near syncope but no syncope patient normally on 2 L nasal cannula oxygen.  Patient denies headache chest pain abdominal pain fevers chills nausea vomiting or diarrhea.     Past Medical History:  Diagnosis Date  . Anemia   . Anemia in CKD (chronic kidney disease)   . Anxiety   . Arthritis    gout, right shoulder  . Cancer [facility])    ?stomach cancer   . Cataracts, both eyes   . CHF (congestive heart failure) ([place])   . Depression   . Depression   . Diabetes mellitus, type 2 (HCC)    Type II - no medication  . Dyspnea   . ESRD (end stage renal disease) ([place])    [facility]  . GERD (gastroesophageal reflux disease)   . Gynecologic cancer   . HOH (hard of hearing)   . Hyperlipidemia   . Hypertension   . Hypothyroidism   . IBS (irritable bowel syndrome)    with costipation  . Intraoperative hemorrhage and hematoma of a digestive system organ or structure complicating a digestive system procedure   . Normal cardiac stress test 05/2015  . Overactive bladder   . Pneumonia    2 times in 2016  . SOB (shortness of breath)    nml spirometry 09/05/06  . Stroke [facility])    residual right side weakness and pain  . SVT (supraventricular tachycardia) ([place])   . Syncope and collapse   . Thyroid disease     Patient Active  Problem List   Diagnosis Date Noted  . Problem with vascular access 04/07/2017  . ESRD needing dialysis ([place]) 04/05/2017  . Hyperkalemia 04/05/2017  . Anemia in chronic kidney disease (CKD) 12/30/2016  . Pancreatic cyst 08/31/2016  . COPD with acute exacerbation ([place]) 08/11/2016  . Essential hypertension 08/11/2016  . Chronic pain 08/11/2016  . Esophageal dysmotility 06/01/2016  . Dysphagia   . Chronic diastolic heart failure ([place]) 10/07/2015  . Fluid overload 10/02/2015  . Hyperglycemia 10/02/2015  . Depression   . Pseudoaneurysm of arteriovenous dialysis fistula ([place]) 05/29/2015  . Traumatic hematoma of scalp 02/12/2014  . Decreased hearing of both ears 02/12/2014  . At high risk for falls 02/12/2014  . Leg swelling 09/18/2013  . OA (osteoarthritis) of knee 09/18/2013  . MDD (major depressive disorder) ([place]) 04/05/2013  . Mechanical complication of other vascular device, implant, and graft 11/14/2012  . Pain in limb-Left arm 07/11/2012  . Hypothyroidism 06/08/2012  . GERD (gastroesophageal reflux disease) 03/23/2012  . Gait instability 01/13/2012  . Neuropathy of hand 01/13/2012  . Breast mass, right 07/11/2011  . Hemorrhoid 03/14/2011  . ESRD (end stage renal disease) on dialysis ([place]) 02/12/2011  . Dyspnea 01/17/2011  . Chronic respiratory failure ([place]) 01/13/2011  . Chronic constipation 12/09/2010  .  Insomnia 10/14/2010  . Diabetes mellitus with renal complications ([place]) 29/93/7169  . ALLERGIC RHINITIS 04/03/2008  . DEPENDENT EDEMA, LEGS, BILATERAL 11/17/2006  . HYPERLIPIDEMIA 10/10/2006  . Anemia in chronic kidney disease 10/10/2006  . Anxiety 09/05/2006  . CATARACT NOS 09/05/2006  . IBS 09/05/2006  . ARTHRITIS 09/05/2006    Past Surgical History:  Procedure Laterality Date  . ABDOMINAL HYSTERECTOMY    . AV FISTULA PLACEMENT, BRACHIOCEPHALIC  67/89/3810   left arm   by Dr. Bridgett Larsson  . COLONOSCOPY    . EYE SURGERY Bilateral    Cataract  . FISTULA  SUPERFICIALIZATION Left 06/10/2015   Procedure: LEFT UPPER ARM BRACHIOCEPHALIC FISTULA PSEUDOANEURYSM PLICATION;  Surgeon: Conrad Pray, MD;  Location: [facility];  Service: Vascular;  Laterality: Left;  . INSERTION OF DIALYSIS CATHETER N/A 04/06/2017   Procedure: INSERTION OF DIALYSIS CATHETER;  Surgeon: Serafina Mitchell, MD;  Location: [facility] OR;  Service: Vascular;  Laterality: N/A;  . IR THROMBECTOMY AV FISTULA W/THROMBOLYSIS/PTA INC/SHUNT/IMG LEFT Left 05/06/2016  . IR US GUIDE VASC ACCESS LEFT  05/06/2016  . PERIPHERAL VASCULAR CATHETERIZATION Left 05/15/2015   Procedure: Fistulagram;  Surgeon: Conrad Frankclay, MD;  Location: [place] [facility];  Service: Cardiovascular;  Laterality: Left;  . TEE WITHOUT CARDIOVERSION  2011   2/2 Strep Bovis infection  . VESICOVAGINAL FISTULA CLOSURE W/ TAH       OB History   None      Home Medications    Prior to Admission medications   Medication Sig Start Date End Date Taking? Authorizing Provider  albuterol (PROVENTIL) (2.5 MG/3ML) 0.083% nebulizer solution USE 1 VIAL IN NEBULIZER 3 TIMES DAILY AS NEEDED FOR WHEEZING/SHORTNESS OF BREATH. 10/01/15  Yes [place], Modena Nunnery, MD  ARTIFICIAL TEARS 1.4 % ophthalmic solution INSTILL 1 DROP INTO EITHER EYE UP TO 6 TIMES DAILY AS NEEDED FOR LUBRICATION 08/18/16  Yes [place], Modena Nunnery, MD  aspirin EC 81 MG tablet TAKE (1) TABLET BY MOUTH ONCE DAILY. Patient taking differently: TAKE 81 MG BY MOUTH ONCE DAILY. 02/28/17  Yes [place], Modena Nunnery, MD  citalopram (CELEXA) 20 MG tablet TAKE (1) TABLET BY MOUTH ONCE IN THE MORNING. Patient taking differently: TAKE 20 MG BY MOUTH ONCE IN THE MORNING. 02/28/17  Yes [place], Modena Nunnery, MD  diltiazem (CARDIZEM CD) 180 MG 24 hr capsule TAKE 1 CAPSULE BY MOUTH ONCE A DAY. Patient taking differently: TAKE 180 MG BY MOUTH ONCE A DAY. 06/25/16  Yes [place], Modena Nunnery, MD  docusate sodium (COLACE) 100 MG capsule TAKE 1 CAPSULE BY MOUTH TWICE DAILY. Patient taking differently: TAKE 100 MG BY MOUTH  TWICE DAILY. 02/28/17  Yes [place], Modena Nunnery, MD  guaiFENesin-dextromethorphan (ROBITUSSIN DM) 100-10 MG/5ML syrup TAKE ONE TEASPOONFUL BY MOUTH EVERY 4 HOURS AS NEEDED FOR COUGH 02/17/16  Yes [place], Lonell Grandchild F, MD  ipratropium (ATROVENT) 0.02 % nebulizer solution USE 1 VIAL IN NEBULIZER 3 TIMES DAILY AS NEEDED FOR WHEEZING/SHORTNESS OF BREATH. 10/01/15  Yes [place], Modena Nunnery, MD  isosorbide mononitrate (IMDUR) 60 MG 24 hr tablet TAKE (1) TABLET BY MOUTH ONCE DAILY. Patient taking differently: TAKE 60 MG BY MOUTH ONCE DAILY. 03/24/16  Yes [place], Modena Nunnery, MD  levothyroxine (SYNTHROID, LEVOTHROID) 25 MCG tablet TAKE (1) TABLET BY MOUTH ONCE DAILY BEFORE BREAKFAST. Patient taking differently: TAKE 25 MCG BY MOUTH ONCE DAILY BEFORE BREAKFAST. 02/28/17  Yes , Modena Nunnery, MD  lidocaine-prilocaine (EMLA) cream APPLY TOPICALLY TO LEFT UPPER ARM 1 HOUR PRIOR TO DIALYSIS ON MONDAY,WEDNESDAY & FRIDAYS AS  NEEDED FOR PAIN. WRAP WITH PLASTIC WRAP AFTER A 03/23/16  Yes [place], Modena Nunnery, MD  mirtazapine (REMERON) 15 MG tablet TAKE 1/2 TABLET (7.5MG ) BY MOUTH AT BEDTIME FOR MOOD/APPETITE. 02/28/17  Yes [place], Modena Nunnery, MD  Multiple Vitamin (TAB-A-VITE PO) Take 1 tablet by mouth daily.   Yes [provider]  nitroGLYCERIN (NITROSTAT) 0.4 MG SL tablet DISSOLVE 1 TABLET UNDER THE TONGUE EVERY 5 MINUTES X2 DOSES THEN TO ER 08/18/16  Yes [place], Modena Nunnery, MD  nystatin (MYCOSTATIN/NYSTOP) powder Apply 4 (four) times daily as needed topically. To groin/ abd folds for irritation 11/18/16  Yes [place], Modena Nunnery, MD  OXYGEN Inhale 2 L into the lungs continuous.    Yes [provider]  pantoprazole (PROTONIX) 40 MG tablet TAKE (1) TABLET BY MOUTH ONCE DAILY. Patient taking differently: TAKE 40 MG BY MOUTH ONCE DAILY. 07/26/16  Yes [place], Modena Nunnery, MD  polyethylene glycol powder (MIRALAX) powder Take 17 g by mouth as needed for mild constipation. Hold for diarrhea Patient taking differently: Take 17 g by  mouth daily.  08/14/16  Yes [place], Ripudeep K, MD  SENSIPAR 30 MG tablet TAKE 1 TABLET BY MOUTH ONCE DAILY AFTER THE EVENING MEAL *PROVIDED [facility]* Patient taking differently: TAKE 30 MG BY MOUTH ONCE DAILY AFTER THE EVENING MEAL *PROVIDED BY [facility]* 03/24/17  Yes [place], Modena Nunnery, MD  sevelamer (RENVELA) 800 MG tablet Take 800-2,400 mg by mouth See admin instructions. Take 2400 mg by mouth three times daily with meals and take 800 mg by mouth once daily with snacks   Yes [provider]  traMADol (ULTRAM) 50 MG tablet Take 1 tablet (50 mg total) by mouth every 12 (twelve) hours as needed for moderate pain. 04/07/17  Yes Lavina Hamman, MD  VENTOLIN HFA 108 (90 Base) MCG/ACT inhaler INHALE (2) PUFFS EVERY 4 HOURS AS NEEDED FOR WHEEZING OR SHORTNESS OFBREATH(COUGH). 03/25/16  Yes [place], Modena Nunnery, MD    Family History Family History  Problem Relation Age of Onset  . Stroke Father   . Hypertension Sister   . Heart failure Sister   . Cancer Brother     Social History Social History   Tobacco Use  . Smoking status: Never Smoker  . Smokeless tobacco: Never Used  Substance Use Topics  . Alcohol use: No    Alcohol/week: 0.0 oz  . Drug use: No     Allergies   Patient has no known allergies.   Review of Systems Review of Systems  Constitutional: Positive for fatigue. Negative for fever.  HENT: Positive for hearing loss. Negative for congestion.   Eyes: Negative for redness.  Respiratory: Negative for shortness of breath.   Cardiovascular: Negative for chest pain.  Gastrointestinal: Negative for abdominal pain.  Genitourinary: Negative for dysuria.  Musculoskeletal: Negative for myalgias.  Skin: Negative for rash.  Allergic/Immunologic: Positive for immunocompromised state.  Neurological: Positive for dizziness and weakness. Negative for syncope and headaches.  Hematological: Bruises/bleeds easily.  Psychiatric/Behavioral: Negative for confusion.      Physical Exam Updated Vital Signs BP (!) 192/77   Pulse 63   Temp 98.7 F (37.1 C) (Oral)   Resp 15   Ht 1.6 m (5\' 3" )   Wt 74.8 kg (165 lb)   LMP 12/24/2010   SpO2 100%   BMI 29.23 kg/m   Physical Exam  Constitutional: She is oriented to person, place, and time. She appears well-developed and well-nourished.  HENT:  Head: Normocephalic and atraumatic.  Mouth/Throat:  Oropharynx is clear and moist.  Eyes: Pupils are equal, round, and reactive to light. Conjunctivae and EOM are normal.  Neck: Neck supple.  Cardiovascular: Normal rate, regular rhythm and normal heart sounds.  Pulmonary/Chest: Effort normal and breath sounds normal. No respiratory distress.  Dialysis catheter right upper chest area.  Abdominal: Soft. Bowel sounds are normal. There is no tenderness.  Musculoskeletal: Normal range of motion.  Neurological: She is alert and oriented to person, place, and time. No cranial nerve deficit or sensory deficit. She exhibits normal muscle tone. Coordination normal.  Skin: Skin is warm.  Nursing note and vitals reviewed.    ED Treatments / Results  Labs (all labs ordered are listed, but only abnormal results are displayed) Labs Reviewed  CBC WITH DIFFERENTIAL/PLATELET - Abnormal; Notable for the following components:      Result Value   Hemoglobin 10.1 (*)    HCT 32.7 (*)    MCH 25.1 (*)    All other components within normal limits  BASIC METABOLIC PANEL - Abnormal; Notable for the following components:   Chloride 95 (*)    Glucose, Bld 137 (*)    BUN 35 (*)    Creatinine, Ser 5.95 (*)    GFR calc non Af Amer 6 (*)    GFR calc Af Amer 7 (*)    All other components within normal limits  TROPONIN I - Abnormal; Notable for the following components:   Troponin I 0.05 (*)    All other components within normal limits  TROPONIN I - Abnormal; Notable for the following components:   Troponin I 0.05 (*)    All other components within normal limits    EKG EKG  Interpretation  Date/Time:  Thursday April 14 2017 09:08:26 EDT Ventricular Rate:  62 PR Interval:    QRS Duration: 94 QT Interval:  471 QTC Calculation: 479 R Axis:   15 Text Interpretation:  Sinus rhythm Borderline prolonged PR interval RSR' in V1 or V2, probably normal variant LVH with secondary repolarization abnormality Anterior ST elevation, probably due to LVH No significant change since last tracing Confirmed by Fredia Sorrow [phone]) on 04/14/2017 9:14:11 AM   Radiology Mr Brain Wo Contrast (neuro Protocol)  Result Date: 04/14/2017 CLINICAL DATA:  84 year old female dialysis patient. Increasing right side weakness since 0600 hours. EXAM: MRI HEAD WITHOUT CONTRAST TECHNIQUE: Multiplanar, multiecho pulse sequences of the brain and surrounding structures were obtained without intravenous contrast. COMPARISON:  Head CTs without contrast 11/26/2015 and earlier. [facility] brain MRI 06/19/2012, and earlier. FINDINGS: Brain: No restricted diffusion to suggest acute infarction. No midline shift, mass effect, ventriculomegaly, extra-axial fluid collection or acute intracranial hemorrhage. Cervicomedullary junction and pituitary are within normal limits. Stable cerebral volume since 2014. Chronic right anterior frontal convexity dural calcification, or less likely small calcified meningioma, is unchanged since 2014 (series 11, image 18). Patchy bilateral periventricular and central white matter T2 and FLAIR hyperintensity has mildly progressed since 2014. There is an unchanged chronic lacunar infarct of the dorsal left thalamus. Tiny chronic microhemorrhage of the left cerebellar vermis is possible on series 11, image 6 and stable. No cortical encephalomalacia or chronic cerebral blood products identified. No new signal abnormality. Vascular: Major intracranial vascular flow voids are preserved and stable since 2014. Skull and upper cervical spine: Negative visible cervical spine. Chronic  hyperostosis of the calvarium. Sinuses/Orbits: Stable and negative orbits soft tissues. Improved bilateral paranasal sinus aeration since 2014 with mild residual mucosal thickening. Mild bubbly opacity in the  right frontal sinus. Other: Mastoid air cells are clear. Negative visible internal auditory structures. Scalp and face soft tissues appear negative. IMPRESSION: 1.  No acute intracranial abnormality. 2. Chronic lacunar infarct of the left thalamus and other mild for age signal changes in the brain are stable since a 2014 MRI. 3. Mild paranasal sinus inflammation, improved since 2014. Electronically Signed   By: Genevie Ann M.D.   On: 04/14/2017 10:42   Dg Chest Portable 1 View  Result Date: 04/14/2017 CLINICAL DATA:  Weakness since this morning EXAM: PORTABLE CHEST 1 VIEW COMPARISON:  04/06/2017 FINDINGS: Chronic cardiomegaly and aortic tortuosity. Perma catheter on the right with tip at the SVC. Elevated left diaphragm, chronic. Vascular congestion without Kerley lines. No pneumothorax. Chronic blunting of the lateral right costophrenic sulcus. IMPRESSION: 1.  No acute finding when compared to prior. 2. Chronic cardiomegaly and vascular congestion. 3. Chronic left diaphragm elevation. Electronically Signed   By: Monte Fantasia M.D.   On: 04/14/2017 09:31    Procedures Procedures (including critical care time)  Medications Ordered in ED Medications - No data to display   Initial Impression / Assessment and Plan / ED Course  I have reviewed the triage vital signs and the nursing notes.  Pertinent labs & imaging results that were available during my care of the patient were reviewed by me and considered in my medical decision making (see chart for details).    Workup without any acute findings to include MRI of brain.  Suspect symptoms may have been related from dialysis too much fluid being drawn off although blood pressure was fine here.  As time went on patient now feeling better symptoms have  resolved.  No evidence of acute stroke.  Basic labs without any significant abnormalities.  Patient stable for discharge back to nursing facility.  Chest x-ray negative.  No evidence of any fluid overload or pneumonia.  No significant changes in EKG compared to last tracing.  Patient has chronically elevated troponins.  Troponins x2 were stable.  No significant change from her baseline.   Final Clinical Impressions(s) / ED Diagnoses   Final diagnoses:  Generalized weakness    ED Discharge Orders    None       Fredia Sorrow, MD 04/14/17 1443

## 2017-04-14 NOTE — ED Notes (Signed)
Pt transported to MRI 

## 2017-04-14 NOTE — ED Notes (Signed)
EDP at bedside updating patient and family. 

## 2017-04-14 NOTE — ED Notes (Signed)
X-ray at bedside

## 2017-04-14 NOTE — ED Notes (Signed)
Date and time results received: 04/14/17 0952  Test: Troponin Critical Value: 0.05 Name of Provider Notified: Dr. Rogene [place]  Orders Received? Or Actions Taken?: None given at this time.

## 2017-04-15 DIAGNOSIS — F0390 Unspecified dementia without behavioral disturbance: Secondary | ICD-10-CM | POA: Diagnosis not present

## 2017-04-15 DIAGNOSIS — R69 Illness, unspecified: Secondary | ICD-10-CM | POA: Diagnosis not present

## 2017-04-15 DIAGNOSIS — N186 End stage renal disease: Secondary | ICD-10-CM | POA: Diagnosis not present

## 2017-04-15 DIAGNOSIS — Z992 Dependence on renal dialysis: Secondary | ICD-10-CM | POA: Diagnosis not present

## 2017-04-16 DIAGNOSIS — F0390 Unspecified dementia without behavioral disturbance: Secondary | ICD-10-CM | POA: Diagnosis not present

## 2017-04-17 DIAGNOSIS — F0390 Unspecified dementia without behavioral disturbance: Secondary | ICD-10-CM | POA: Diagnosis not present

## 2017-04-18 DIAGNOSIS — N186 End stage renal disease: Secondary | ICD-10-CM | POA: Diagnosis not present

## 2017-04-18 DIAGNOSIS — F0390 Unspecified dementia without behavioral disturbance: Secondary | ICD-10-CM | POA: Diagnosis not present

## 2017-04-18 DIAGNOSIS — Z992 Dependence on renal dialysis: Secondary | ICD-10-CM | POA: Diagnosis not present

## 2017-04-18 DIAGNOSIS — R69 Illness, unspecified: Secondary | ICD-10-CM | POA: Diagnosis not present

## 2017-04-19 DIAGNOSIS — F0390 Unspecified dementia without behavioral disturbance: Secondary | ICD-10-CM | POA: Diagnosis not present

## 2017-04-20 DIAGNOSIS — Z992 Dependence on renal dialysis: Secondary | ICD-10-CM | POA: Diagnosis not present

## 2017-04-20 DIAGNOSIS — N186 End stage renal disease: Secondary | ICD-10-CM | POA: Diagnosis not present

## 2017-04-20 DIAGNOSIS — R69 Illness, unspecified: Secondary | ICD-10-CM | POA: Diagnosis not present

## 2017-04-20 DIAGNOSIS — F0390 Unspecified dementia without behavioral disturbance: Secondary | ICD-10-CM | POA: Diagnosis not present

## 2017-04-21 DIAGNOSIS — F0390 Unspecified dementia without behavioral disturbance: Secondary | ICD-10-CM | POA: Diagnosis not present

## 2017-04-22 DIAGNOSIS — N186 End stage renal disease: Secondary | ICD-10-CM | POA: Diagnosis not present

## 2017-04-22 DIAGNOSIS — F0390 Unspecified dementia without behavioral disturbance: Secondary | ICD-10-CM | POA: Diagnosis not present

## 2017-04-22 DIAGNOSIS — Z992 Dependence on renal dialysis: Secondary | ICD-10-CM | POA: Diagnosis not present

## 2017-04-22 DIAGNOSIS — R69 Illness, unspecified: Secondary | ICD-10-CM | POA: Diagnosis not present

## 2017-04-23 DIAGNOSIS — F0390 Unspecified dementia without behavioral disturbance: Secondary | ICD-10-CM | POA: Diagnosis not present

## 2017-04-24 DIAGNOSIS — F0390 Unspecified dementia without behavioral disturbance: Secondary | ICD-10-CM | POA: Diagnosis not present

## 2017-04-25 ENCOUNTER — Other Ambulatory Visit: Payer: Self-pay | Admitting: Family Medicine

## 2017-04-25 DIAGNOSIS — R69 Illness, unspecified: Secondary | ICD-10-CM | POA: Diagnosis not present

## 2017-04-25 DIAGNOSIS — N186 End stage renal disease: Secondary | ICD-10-CM | POA: Diagnosis not present

## 2017-04-25 DIAGNOSIS — F0390 Unspecified dementia without behavioral disturbance: Secondary | ICD-10-CM | POA: Diagnosis not present

## 2017-04-25 DIAGNOSIS — Z992 Dependence on renal dialysis: Secondary | ICD-10-CM | POA: Diagnosis not present

## 2017-04-26 ENCOUNTER — Ambulatory Visit (HOSPITAL_COMMUNITY)
Admission: RE | Admit: 2017-04-26 | Discharge: 2017-04-26 | Disposition: A | Discharge status: Home / Self Care | Payer: Medicare HMO | Source: Ambulatory Visit | Attending: Vascular Surgery | Admitting: Vascular Surgery

## 2017-04-26 ENCOUNTER — Ambulatory Visit (INDEPENDENT_AMBULATORY_CARE_PROVIDER_SITE_OTHER)
Admission: RE | Admit: 2017-04-26 | Discharge: 2017-04-26 | Disposition: A | Discharge status: Home / Self Care | Payer: Medicare HMO | Source: Ambulatory Visit | Attending: Vascular Surgery | Admitting: Vascular Surgery

## 2017-04-26 ENCOUNTER — Encounter: Payer: Medicare HMO | Admitting: Vascular Surgery

## 2017-04-26 DIAGNOSIS — N186 End stage renal disease: Secondary | ICD-10-CM | POA: Diagnosis not present

## 2017-04-26 DIAGNOSIS — Z992 Dependence on renal dialysis: Secondary | ICD-10-CM

## 2017-04-26 DIAGNOSIS — F0390 Unspecified dementia without behavioral disturbance: Secondary | ICD-10-CM | POA: Diagnosis not present

## 2017-04-27 DIAGNOSIS — R69 Illness, unspecified: Secondary | ICD-10-CM | POA: Diagnosis not present

## 2017-04-27 DIAGNOSIS — Z992 Dependence on renal dialysis: Secondary | ICD-10-CM | POA: Diagnosis not present

## 2017-04-27 DIAGNOSIS — N186 End stage renal disease: Secondary | ICD-10-CM | POA: Diagnosis not present

## 2017-04-27 DIAGNOSIS — F0390 Unspecified dementia without behavioral disturbance: Secondary | ICD-10-CM | POA: Diagnosis not present

## 2017-04-28 DIAGNOSIS — F0390 Unspecified dementia without behavioral disturbance: Secondary | ICD-10-CM | POA: Diagnosis not present

## 2017-04-29 DIAGNOSIS — F0390 Unspecified dementia without behavioral disturbance: Secondary | ICD-10-CM | POA: Diagnosis not present

## 2017-04-29 DIAGNOSIS — Z992 Dependence on renal dialysis: Secondary | ICD-10-CM | POA: Diagnosis not present

## 2017-04-29 DIAGNOSIS — R69 Illness, unspecified: Secondary | ICD-10-CM | POA: Diagnosis not present

## 2017-04-29 DIAGNOSIS — N186 End stage renal disease: Secondary | ICD-10-CM | POA: Diagnosis not present

## 2017-04-30 DIAGNOSIS — F0390 Unspecified dementia without behavioral disturbance: Secondary | ICD-10-CM | POA: Diagnosis not present

## 2017-05-01 DIAGNOSIS — F0390 Unspecified dementia without behavioral disturbance: Secondary | ICD-10-CM | POA: Diagnosis not present

## 2017-05-02 DIAGNOSIS — R69 Illness, unspecified: Secondary | ICD-10-CM | POA: Diagnosis not present

## 2017-05-02 DIAGNOSIS — F0390 Unspecified dementia without behavioral disturbance: Secondary | ICD-10-CM | POA: Diagnosis not present

## 2017-05-02 DIAGNOSIS — N186 End stage renal disease: Secondary | ICD-10-CM | POA: Diagnosis not present

## 2017-05-02 DIAGNOSIS — Z992 Dependence on renal dialysis: Secondary | ICD-10-CM | POA: Diagnosis not present

## 2017-05-03 DIAGNOSIS — H34812 Central retinal vein occlusion, left eye, with macular edema: Secondary | ICD-10-CM | POA: Diagnosis not present

## 2017-05-03 DIAGNOSIS — E113293 Type 2 diabetes mellitus with mild nonproliferative diabetic retinopathy without macular edema, bilateral: Secondary | ICD-10-CM | POA: Diagnosis not present

## 2017-05-03 DIAGNOSIS — H3563 Retinal hemorrhage, bilateral: Secondary | ICD-10-CM | POA: Diagnosis not present

## 2017-05-03 DIAGNOSIS — F0390 Unspecified dementia without behavioral disturbance: Secondary | ICD-10-CM | POA: Diagnosis not present

## 2017-05-03 DIAGNOSIS — H35371 Puckering of macula, right eye: Secondary | ICD-10-CM | POA: Diagnosis not present

## 2017-05-04 ENCOUNTER — Other Ambulatory Visit: Payer: Self-pay | Admitting: Family Medicine

## 2017-05-04 DIAGNOSIS — Z992 Dependence on renal dialysis: Secondary | ICD-10-CM | POA: Diagnosis not present

## 2017-05-04 DIAGNOSIS — N186 End stage renal disease: Secondary | ICD-10-CM | POA: Diagnosis not present

## 2017-05-04 DIAGNOSIS — F0390 Unspecified dementia without behavioral disturbance: Secondary | ICD-10-CM | POA: Diagnosis not present

## 2017-05-04 DIAGNOSIS — R69 Illness, unspecified: Secondary | ICD-10-CM | POA: Diagnosis not present

## 2017-05-05 DIAGNOSIS — F0390 Unspecified dementia without behavioral disturbance: Secondary | ICD-10-CM | POA: Diagnosis not present

## 2017-05-06 DIAGNOSIS — Z992 Dependence on renal dialysis: Secondary | ICD-10-CM | POA: Diagnosis not present

## 2017-05-06 DIAGNOSIS — R69 Illness, unspecified: Secondary | ICD-10-CM | POA: Diagnosis not present

## 2017-05-06 DIAGNOSIS — F0390 Unspecified dementia without behavioral disturbance: Secondary | ICD-10-CM | POA: Diagnosis not present

## 2017-05-06 DIAGNOSIS — N186 End stage renal disease: Secondary | ICD-10-CM | POA: Diagnosis not present

## 2017-05-07 DIAGNOSIS — F0390 Unspecified dementia without behavioral disturbance: Secondary | ICD-10-CM | POA: Diagnosis not present

## 2017-05-08 DIAGNOSIS — F0390 Unspecified dementia without behavioral disturbance: Secondary | ICD-10-CM | POA: Diagnosis not present

## 2017-05-09 DIAGNOSIS — Z992 Dependence on renal dialysis: Secondary | ICD-10-CM | POA: Diagnosis not present

## 2017-05-09 DIAGNOSIS — F0390 Unspecified dementia without behavioral disturbance: Secondary | ICD-10-CM | POA: Diagnosis not present

## 2017-05-09 DIAGNOSIS — R69 Illness, unspecified: Secondary | ICD-10-CM | POA: Diagnosis not present

## 2017-05-09 DIAGNOSIS — N186 End stage renal disease: Secondary | ICD-10-CM | POA: Diagnosis not present

## 2017-05-10 DIAGNOSIS — F0390 Unspecified dementia without behavioral disturbance: Secondary | ICD-10-CM | POA: Diagnosis not present

## 2017-05-10 DIAGNOSIS — N186 End stage renal disease: Secondary | ICD-10-CM | POA: Diagnosis not present

## 2017-05-10 DIAGNOSIS — Z992 Dependence on renal dialysis: Secondary | ICD-10-CM | POA: Diagnosis not present

## 2017-05-11 DIAGNOSIS — Z992 Dependence on renal dialysis: Secondary | ICD-10-CM | POA: Diagnosis not present

## 2017-05-11 DIAGNOSIS — F0390 Unspecified dementia without behavioral disturbance: Secondary | ICD-10-CM | POA: Diagnosis not present

## 2017-05-11 DIAGNOSIS — N186 End stage renal disease: Secondary | ICD-10-CM | POA: Diagnosis not present

## 2017-05-12 DIAGNOSIS — F0390 Unspecified dementia without behavioral disturbance: Secondary | ICD-10-CM | POA: Diagnosis not present

## 2017-05-13 DIAGNOSIS — Z992 Dependence on renal dialysis: Secondary | ICD-10-CM | POA: Diagnosis not present

## 2017-05-13 DIAGNOSIS — N186 End stage renal disease: Secondary | ICD-10-CM | POA: Diagnosis not present

## 2017-05-13 DIAGNOSIS — F0390 Unspecified dementia without behavioral disturbance: Secondary | ICD-10-CM | POA: Diagnosis not present

## 2017-05-14 DIAGNOSIS — R0602 Shortness of breath: Secondary | ICD-10-CM | POA: Diagnosis not present

## 2017-05-14 DIAGNOSIS — R269 Unspecified abnormalities of gait and mobility: Secondary | ICD-10-CM | POA: Diagnosis not present

## 2017-05-14 DIAGNOSIS — I5032 Chronic diastolic (congestive) heart failure: Secondary | ICD-10-CM | POA: Diagnosis not present

## 2017-05-14 DIAGNOSIS — I509 Heart failure, unspecified: Secondary | ICD-10-CM | POA: Diagnosis not present

## 2017-05-14 DIAGNOSIS — F0390 Unspecified dementia without behavioral disturbance: Secondary | ICD-10-CM | POA: Diagnosis not present

## 2017-05-14 DIAGNOSIS — J449 Chronic obstructive pulmonary disease, unspecified: Secondary | ICD-10-CM | POA: Diagnosis not present

## 2017-05-14 DIAGNOSIS — J9610 Chronic respiratory failure, unspecified whether with hypoxia or hypercapnia: Secondary | ICD-10-CM | POA: Diagnosis not present

## 2017-05-14 DIAGNOSIS — R296 Repeated falls: Secondary | ICD-10-CM | POA: Diagnosis not present

## 2017-05-14 DIAGNOSIS — R2681 Unsteadiness on feet: Secondary | ICD-10-CM | POA: Diagnosis not present

## 2017-05-15 DIAGNOSIS — F0390 Unspecified dementia without behavioral disturbance: Secondary | ICD-10-CM | POA: Diagnosis not present

## 2017-05-16 DIAGNOSIS — N186 End stage renal disease: Secondary | ICD-10-CM | POA: Diagnosis not present

## 2017-05-16 DIAGNOSIS — F0390 Unspecified dementia without behavioral disturbance: Secondary | ICD-10-CM | POA: Diagnosis not present

## 2017-05-16 DIAGNOSIS — Z992 Dependence on renal dialysis: Secondary | ICD-10-CM | POA: Diagnosis not present

## 2017-05-17 ENCOUNTER — Ambulatory Visit: Payer: Medicare HMO | Admitting: Family Medicine

## 2017-05-17 DIAGNOSIS — F0390 Unspecified dementia without behavioral disturbance: Secondary | ICD-10-CM | POA: Diagnosis not present

## 2017-05-18 DIAGNOSIS — Z992 Dependence on renal dialysis: Secondary | ICD-10-CM | POA: Diagnosis not present

## 2017-05-18 DIAGNOSIS — F0390 Unspecified dementia without behavioral disturbance: Secondary | ICD-10-CM | POA: Diagnosis not present

## 2017-05-18 DIAGNOSIS — N186 End stage renal disease: Secondary | ICD-10-CM | POA: Diagnosis not present

## 2017-05-19 ENCOUNTER — Encounter: Payer: Self-pay | Admitting: Vascular Surgery

## 2017-05-19 ENCOUNTER — Other Ambulatory Visit: Payer: Self-pay

## 2017-05-19 ENCOUNTER — Other Ambulatory Visit: Payer: Self-pay | Admitting: *Deleted

## 2017-05-19 ENCOUNTER — Ambulatory Visit (INDEPENDENT_AMBULATORY_CARE_PROVIDER_SITE_OTHER): Payer: Medicare HMO | Admitting: Vascular Surgery

## 2017-05-19 ENCOUNTER — Encounter: Payer: Self-pay | Admitting: *Deleted

## 2017-05-19 VITALS — BP 183/83 | HR 72 | Temp 98.6°F | Resp 14 | Ht 66.0 in | Wt 165.0 lb

## 2017-05-19 DIAGNOSIS — N186 End stage renal disease: Secondary | ICD-10-CM | POA: Diagnosis not present

## 2017-05-19 DIAGNOSIS — Z992 Dependence on renal dialysis: Secondary | ICD-10-CM | POA: Diagnosis not present

## 2017-05-19 DIAGNOSIS — F0390 Unspecified dementia without behavioral disturbance: Secondary | ICD-10-CM | POA: Diagnosis not present

## 2017-05-19 NOTE — Progress Notes (Signed)
Vitals:   05/19/17 1419  BP: (!) 202/94  Pulse: 72  Resp: 14  Temp: 98.6 F (37 C)  TempSrc: Oral  SpO2: 93%  Weight: 165 lb (74.8 kg)  Height: 5\' 6"  (1.676 m)

## 2017-05-19 NOTE — H&P (View-Only) (Signed)
Patient is an 84 year old female referred for placement of a new hemodialysis access.  She previously had a left brachiocephalic AV fistula which lasted for 6 years.  It is now occluded.  She is currently dialyzing via right sided catheter.  Her dialysis today is Tuesday Thursday Saturday at [facility] hemod[facility] center.  She currently resides in a skilled nursing facility.  She has had a prior stroke which affected her right side but this has recovered.  She is right-handed.  Review of systems: She has shortness of breath with exertion but otherwise is very functional.  She denies chest pain.  Current Outpatient Medications on File Prior to Visit  Medication Sig Dispense Refill  . albuterol (PROVENTIL) (2.5 MG/3ML) 0.083% nebulizer solution USE 1 VIAL IN NEBULIZER 3 TIMES DAILY AS NEEDED FOR WHEEZING/SHORTNESS OF BREATH. 360 mL 0  . albuterol (PROVENTIL) (2.5 MG/3ML) 0.083% nebulizer solution INHALE 1 VIAL VIA NEBULIER 3 TIMES A DAY FOR 7 DAYS FOR SHORTNESS OF BREATH AND THEN D/C 75 mL 11  . ARTIFICIAL TEARS 1.4 % ophthalmic solution INSTILL 1 DROP INTO EITHER EYE UP TO 6 TIMES DAILY AS NEEDED FOR LUBRICATION 15 mL 11  . aspirin EC 81 MG tablet TAKE (1) TABLET BY MOUTH ONCE DAILY. (Patient taking differently: TAKE 81 MG BY MOUTH ONCE DAILY.) 31 tablet 11  . citalopram (CELEXA) 20 MG tablet TAKE (1) TABLET BY MOUTH ONCE IN THE MORNING. (Patient taking differently: TAKE 20 MG BY MOUTH ONCE IN THE MORNING.) 31 tablet 11  . diltiazem (CARDIZEM CD) 180 MG 24 hr capsule TAKE 1 CAPSULE BY MOUTH ONCE A DAY. (Patient taking differently: TAKE 180 MG BY MOUTH ONCE A DAY.) 30 capsule 11  . docusate sodium (COLACE) 100 MG capsule TAKE 1 CAPSULE BY MOUTH TWICE DAILY. (Patient taking differently: TAKE 100 MG BY MOUTH TWICE DAILY.) 62 capsule 11  . ipratropium (ATROVENT) 0.02 % nebulizer solution USE 1 VIAL IN NEBULIZER 3 TIMES DAILY AS NEEDED FOR WHEEZING/SHORTNESS OF BREATH. 150 mL 11  . isosorbide mononitrate (IMDUR)  60 MG 24 hr tablet TAKE (1) TABLET BY MOUTH ONCE DAILY. (Patient taking differently: TAKE 60 MG BY MOUTH ONCE DAILY.) 31 tablet 11  . levothyroxine (SYNTHROID, LEVOTHROID) 25 MCG tablet TAKE (1) TABLET BY MOUTH ONCE DAILY BEFORE BREAKFAST. (Patient taking differently: TAKE 25 MCG BY MOUTH ONCE DAILY BEFORE BREAKFAST.) 31 tablet 11  . lidocaine-prilocaine (EMLA) cream APPLY TOPICALLY TO LEFT UPPER ARM 1 HOUR PRIOR TO DIALYSIS ON MONDAY,WEDNESDAY & FRIDAYS AS NEEDED FOR PAIN. WRAP WITH PLASTIC WRAP AFTER A 30 g 11  . mirtazapine (REMERON) 15 MG tablet TAKE 1/2 TABLET (7.5MG ) BY MOUTH AT BEDTIME FOR MOOD/APPETITE. 16 tablet 11  . Multiple Vitamin (TAB-A-VITE PO) Take 1 tablet by mouth daily.    . nitroGLYCERIN (NITROSTAT) 0.4 MG SL tablet DISSOLVE 1 TABLET UNDER THE TONGUE EVERY 5 MINUTES X2 DOSES THEN TO ER 25 tablet 11  . nystatin (MYCOSTATIN/NYSTOP) powder Apply 4 (four) times daily as needed topically. To groin/ abd folds for irritation 1 Bottle 2  . OXYGEN Inhale 2 L into the lungs continuous.     . pantoprazole (PROTONIX) 40 MG tablet TAKE (1) TABLET BY MOUTH ONCE DAILY. (Patient taking differently: TAKE 40 MG BY MOUTH ONCE DAILY.) 31 tablet 11  . polyethylene glycol powder (MIRALAX) powder Take 17 g by mouth as needed for mild constipation. Hold for diarrhea (Patient taking differently: Take 17 g by mouth daily. ) 250 g 2  . SENSIPAR 30 MG tablet TAKE  1 TABLET BY MOUTH ONCE DAILY AFTER THE EVENING MEAL *PROVIDED [facility]* (Patient taking differently: TAKE 30 MG BY MOUTH ONCE DAILY AFTER THE EVENING MEAL *PROVIDED BY [facility]*) 28 tablet PRN  . sevelamer (RENVELA) 800 MG tablet Take 800-2,400 mg by mouth See admin instructions. Take 2400 mg by mouth three times daily with meals and take 800 mg by mouth once daily with snacks    . traMADol (ULTRAM) 50 MG tablet Take 1 tablet (50 mg total) by mouth every 12 (twelve) hours as needed for moderate pain. 5 tablet 0  . TUSSIN DM 100-10  MG/5ML liquid TAKE ONE TEASPOONFUL BY MOUTH EVERY 4 HOURS AS NEEDED FOR COUGH 118 mL 11  . VENTOLIN HFA 108 (90 Base) MCG/ACT inhaler INHALE (2) PUFFS EVERY 4 HOURS AS NEEDED FOR WHEEZING OR SHORTNESS OFBREATH(COUGH). 18 g 11   No current facility-administered medications on file prior to visit.     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  Assessment: Patient needs new hemodialysis access.  Plan: Patient is scheduled for a right upper arm cephalic fistula on Monday, May 23, 2017.  We will need to rearrange her dialysis schedule.  Risk benefits possible complications procedure details including but not limited to bleeding infection non-maturation of the fistula ischemic steal were discussed with the patient and her family today.  They understand and agree to proceed.  Ruta Hinds, MD [facility] Office: [phone] Pager: [phone]

## 2017-05-19 NOTE — Progress Notes (Signed)
Patient is an 84 year old female referred for placement of a new hemodialysis access.  She previously had a left brachiocephalic AV fistula which lasted for 6 years.  It is now occluded.  She is currently dialyzing via right sided catheter.  Her dialysis today is Tuesday Thursday Saturday at [facility] hemod[facility] center.  She currently resides in a skilled nursing facility.  She has had a prior stroke which affected her right side but this has recovered.  She is right-handed.  Review of systems: She has shortness of breath with exertion but otherwise is very functional.  She denies chest pain.  Current Outpatient Medications on File Prior to Visit  Medication Sig Dispense Refill  . albuterol (PROVENTIL) (2.5 MG/3ML) 0.083% nebulizer solution USE 1 VIAL IN NEBULIZER 3 TIMES DAILY AS NEEDED FOR WHEEZING/SHORTNESS OF BREATH. 360 mL 0  . albuterol (PROVENTIL) (2.5 MG/3ML) 0.083% nebulizer solution INHALE 1 VIAL VIA NEBULIER 3 TIMES A DAY FOR 7 DAYS FOR SHORTNESS OF BREATH AND THEN D/C 75 mL 11  . ARTIFICIAL TEARS 1.4 % ophthalmic solution INSTILL 1 DROP INTO EITHER EYE UP TO 6 TIMES DAILY AS NEEDED FOR LUBRICATION 15 mL 11  . aspirin EC 81 MG tablet TAKE (1) TABLET BY MOUTH ONCE DAILY. (Patient taking differently: TAKE 81 MG BY MOUTH ONCE DAILY.) 31 tablet 11  . citalopram (CELEXA) 20 MG tablet TAKE (1) TABLET BY MOUTH ONCE IN THE MORNING. (Patient taking differently: TAKE 20 MG BY MOUTH ONCE IN THE MORNING.) 31 tablet 11  . diltiazem (CARDIZEM CD) 180 MG 24 hr capsule TAKE 1 CAPSULE BY MOUTH ONCE A DAY. (Patient taking differently: TAKE 180 MG BY MOUTH ONCE A DAY.) 30 capsule 11  . docusate sodium (COLACE) 100 MG capsule TAKE 1 CAPSULE BY MOUTH TWICE DAILY. (Patient taking differently: TAKE 100 MG BY MOUTH TWICE DAILY.) 62 capsule 11  . ipratropium (ATROVENT) 0.02 % nebulizer solution USE 1 VIAL IN NEBULIZER 3 TIMES DAILY AS NEEDED FOR WHEEZING/SHORTNESS OF BREATH. 150 mL 11  . isosorbide mononitrate (IMDUR)  60 MG 24 hr tablet TAKE (1) TABLET BY MOUTH ONCE DAILY. (Patient taking differently: TAKE 60 MG BY MOUTH ONCE DAILY.) 31 tablet 11  . levothyroxine (SYNTHROID, LEVOTHROID) 25 MCG tablet TAKE (1) TABLET BY MOUTH ONCE DAILY BEFORE BREAKFAST. (Patient taking differently: TAKE 25 MCG BY MOUTH ONCE DAILY BEFORE BREAKFAST.) 31 tablet 11  . lidocaine-prilocaine (EMLA) cream APPLY TOPICALLY TO LEFT UPPER ARM 1 HOUR PRIOR TO DIALYSIS ON MONDAY,WEDNESDAY & FRIDAYS AS NEEDED FOR PAIN. WRAP WITH PLASTIC WRAP AFTER A 30 g 11  . mirtazapine (REMERON) 15 MG tablet TAKE 1/2 TABLET (7.5MG ) BY MOUTH AT BEDTIME FOR MOOD/APPETITE. 16 tablet 11  . Multiple Vitamin (TAB-A-VITE PO) Take 1 tablet by mouth daily.    . nitroGLYCERIN (NITROSTAT) 0.4 MG SL tablet DISSOLVE 1 TABLET UNDER THE TONGUE EVERY 5 MINUTES X2 DOSES THEN TO ER 25 tablet 11  . nystatin (MYCOSTATIN/NYSTOP) powder Apply 4 (four) times daily as needed topically. To groin/ abd folds for irritation 1 Bottle 2  . OXYGEN Inhale 2 L into the lungs continuous.     . pantoprazole (PROTONIX) 40 MG tablet TAKE (1) TABLET BY MOUTH ONCE DAILY. (Patient taking differently: TAKE 40 MG BY MOUTH ONCE DAILY.) 31 tablet 11  . polyethylene glycol powder (MIRALAX) powder Take 17 g by mouth as needed for mild constipation. Hold for diarrhea (Patient taking differently: Take 17 g by mouth daily. ) 250 g 2  . SENSIPAR 30 MG tablet TAKE  1 TABLET BY MOUTH ONCE DAILY AFTER THE EVENING MEAL *PROVIDED [facility]* (Patient taking differently: TAKE 30 MG BY MOUTH ONCE DAILY AFTER THE EVENING MEAL *PROVIDED BY [facility]*) 28 tablet PRN  . sevelamer (RENVELA) 800 MG tablet Take 800-2,400 mg by mouth See admin instructions. Take 2400 mg by mouth three times daily with meals and take 800 mg by mouth once daily with snacks    . traMADol (ULTRAM) 50 MG tablet Take 1 tablet (50 mg total) by mouth every 12 (twelve) hours as needed for moderate pain. 5 tablet 0  . TUSSIN DM 100-10  MG/5ML liquid TAKE ONE TEASPOONFUL BY MOUTH EVERY 4 HOURS AS NEEDED FOR COUGH 118 mL 11  . VENTOLIN HFA 108 (90 Base) MCG/ACT inhaler INHALE (2) PUFFS EVERY 4 HOURS AS NEEDED FOR WHEEZING OR SHORTNESS OFBREATH(COUGH). 18 g 11   No current facility-administered medications on file prior to visit.     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  Assessment: Patient needs new hemodialysis access.  Plan: Patient is scheduled for a right upper arm cephalic fistula on Monday, May 23, 2017.  We will need to rearrange her dialysis schedule.  Risk benefits possible complications procedure details including but not limited to bleeding infection non-maturation of the fistula ischemic steal were discussed with the patient and her family today.  They understand and agree to proceed.  Ruta Hinds, MD [facility] [place] Office: [phone] Pager: [phone]

## 2017-05-20 ENCOUNTER — Encounter (HOSPITAL_COMMUNITY): Payer: Self-pay | Admitting: *Deleted

## 2017-05-20 ENCOUNTER — Other Ambulatory Visit: Payer: Self-pay

## 2017-05-20 DIAGNOSIS — F0390 Unspecified dementia without behavioral disturbance: Secondary | ICD-10-CM | POA: Diagnosis not present

## 2017-05-20 DIAGNOSIS — N186 End stage renal disease: Secondary | ICD-10-CM | POA: Diagnosis not present

## 2017-05-20 DIAGNOSIS — Z992 Dependence on renal dialysis: Secondary | ICD-10-CM | POA: Diagnosis not present

## 2017-05-21 DIAGNOSIS — F0390 Unspecified dementia without behavioral disturbance: Secondary | ICD-10-CM | POA: Diagnosis not present

## 2017-05-22 DIAGNOSIS — F0390 Unspecified dementia without behavioral disturbance: Secondary | ICD-10-CM | POA: Diagnosis not present

## 2017-05-23 ENCOUNTER — Other Ambulatory Visit: Payer: Self-pay

## 2017-05-23 ENCOUNTER — Encounter (HOSPITAL_COMMUNITY)
Admission: RE | Disposition: A | Discharge status: Home / Self Care | Payer: Self-pay | Source: Ambulatory Visit | Attending: Vascular Surgery

## 2017-05-23 ENCOUNTER — Ambulatory Visit (HOSPITAL_COMMUNITY): Payer: Medicare HMO | Admitting: Certified Registered"

## 2017-05-23 ENCOUNTER — Observation Stay (HOSPITAL_COMMUNITY)
Admission: RE | Admit: 2017-05-23 | Discharge: 2017-05-24 | Disposition: A | Discharge status: Home / Self Care | Payer: Medicare HMO | Source: Ambulatory Visit | Attending: Vascular Surgery | Admitting: Vascular Surgery

## 2017-05-23 ENCOUNTER — Encounter (HOSPITAL_COMMUNITY): Payer: Self-pay

## 2017-05-23 DIAGNOSIS — J449 Chronic obstructive pulmonary disease, unspecified: Secondary | ICD-10-CM | POA: Diagnosis not present

## 2017-05-23 DIAGNOSIS — K581 Irritable bowel syndrome with constipation: Secondary | ICD-10-CM | POA: Diagnosis not present

## 2017-05-23 DIAGNOSIS — Z9981 Dependence on supplemental oxygen: Secondary | ICD-10-CM | POA: Diagnosis not present

## 2017-05-23 DIAGNOSIS — K219 Gastro-esophageal reflux disease without esophagitis: Secondary | ICD-10-CM | POA: Diagnosis not present

## 2017-05-23 DIAGNOSIS — I471 Supraventricular tachycardia: Secondary | ICD-10-CM | POA: Diagnosis not present

## 2017-05-23 DIAGNOSIS — M1990 Unspecified osteoarthritis, unspecified site: Secondary | ICD-10-CM | POA: Insufficient documentation

## 2017-05-23 DIAGNOSIS — Z79890 Hormone replacement therapy (postmenopausal): Secondary | ICD-10-CM | POA: Diagnosis not present

## 2017-05-23 DIAGNOSIS — K224 Dyskinesia of esophagus: Secondary | ICD-10-CM | POA: Insufficient documentation

## 2017-05-23 DIAGNOSIS — F329 Major depressive disorder, single episode, unspecified: Secondary | ICD-10-CM | POA: Diagnosis not present

## 2017-05-23 DIAGNOSIS — I5032 Chronic diastolic (congestive) heart failure: Secondary | ICD-10-CM | POA: Insufficient documentation

## 2017-05-23 DIAGNOSIS — E1122 Type 2 diabetes mellitus with diabetic chronic kidney disease: Principal | ICD-10-CM | POA: Insufficient documentation

## 2017-05-23 DIAGNOSIS — G4700 Insomnia, unspecified: Secondary | ICD-10-CM | POA: Diagnosis not present

## 2017-05-23 DIAGNOSIS — N186 End stage renal disease: Secondary | ICD-10-CM | POA: Diagnosis not present

## 2017-05-23 DIAGNOSIS — N185 Chronic kidney disease, stage 5: Secondary | ICD-10-CM | POA: Diagnosis not present

## 2017-05-23 DIAGNOSIS — Z79899 Other long term (current) drug therapy: Secondary | ICD-10-CM | POA: Diagnosis not present

## 2017-05-23 DIAGNOSIS — K862 Cyst of pancreas: Secondary | ICD-10-CM | POA: Diagnosis not present

## 2017-05-23 DIAGNOSIS — J9610 Chronic respiratory failure, unspecified whether with hypoxia or hypercapnia: Secondary | ICD-10-CM | POA: Diagnosis not present

## 2017-05-23 DIAGNOSIS — I132 Hypertensive heart and chronic kidney disease with heart failure and with stage 5 chronic kidney disease, or end stage renal disease: Secondary | ICD-10-CM | POA: Diagnosis not present

## 2017-05-23 DIAGNOSIS — Z8673 Personal history of transient ischemic attack (TIA), and cerebral infarction without residual deficits: Secondary | ICD-10-CM | POA: Insufficient documentation

## 2017-05-23 DIAGNOSIS — E039 Hypothyroidism, unspecified: Secondary | ICD-10-CM | POA: Diagnosis not present

## 2017-05-23 DIAGNOSIS — Z992 Dependence on renal dialysis: Secondary | ICD-10-CM | POA: Diagnosis not present

## 2017-05-23 DIAGNOSIS — R0602 Shortness of breath: Secondary | ICD-10-CM

## 2017-05-23 DIAGNOSIS — Z7982 Long term (current) use of aspirin: Secondary | ICD-10-CM | POA: Insufficient documentation

## 2017-05-23 DIAGNOSIS — G8929 Other chronic pain: Secondary | ICD-10-CM | POA: Diagnosis not present

## 2017-05-23 HISTORY — PX: AV FISTULA PLACEMENT: SHX1204

## 2017-05-23 LAB — POCT I-STAT 4, (NA,K, GLUC, HGB,HCT)
GLUCOSE: 101 mg/dL — AB (ref 65–99)
HEMATOCRIT: 32 % — AB (ref 36.0–46.0)
Hemoglobin: 10.9 g/dL — ABNORMAL LOW (ref 12.0–15.0)
Potassium: 5.2 mmol/L — ABNORMAL HIGH (ref 3.5–5.1)
Sodium: 137 mmol/L (ref 135–145)

## 2017-05-23 LAB — GLUCOSE, CAPILLARY: GLUCOSE-CAPILLARY: 108 mg/dL — AB (ref 65–99)

## 2017-05-23 LAB — SURGICAL PCR SCREEN
MRSA, PCR: NEGATIVE
STAPHYLOCOCCUS AUREUS: NEGATIVE

## 2017-05-23 SURGERY — ARTERIOVENOUS (AV) FISTULA CREATION
Anesthesia: Monitor Anesthesia Care | Site: Arm Upper | Laterality: Right

## 2017-05-23 MED ORDER — PROPOFOL 500 MG/50ML IV EMUL
INTRAVENOUS | Status: DC | PRN
  Administered 2017-05-23: 50 ug/kg/min via INTRAVENOUS

## 2017-05-23 MED ORDER — 0.9 % SODIUM CHLORIDE (POUR BTL) OPTIME
TOPICAL | Status: DC | PRN
  Administered 2017-05-23: 1000 mL

## 2017-05-23 MED ORDER — CINACALCET HCL 30 MG PO TABS
30.0000 mg | ORAL_TABLET | Freq: Every day | ORAL | Status: DC
  Administered 2017-05-24: 30 mg via ORAL
  Filled 2017-05-23: qty 1

## 2017-05-23 MED ORDER — LEVOTHYROXINE SODIUM 25 MCG PO TABS
25.0000 ug | ORAL_TABLET | Freq: Every day | ORAL | Status: DC
  Administered 2017-05-24: 25 ug via ORAL
  Filled 2017-05-23: qty 1

## 2017-05-23 MED ORDER — PHENYLEPHRINE 40 MCG/ML (10ML) SYRINGE FOR IV PUSH (FOR BLOOD PRESSURE SUPPORT)
PREFILLED_SYRINGE | INTRAVENOUS | Status: AC
  Filled 2017-05-23: qty 10

## 2017-05-23 MED ORDER — ALBUTEROL SULFATE (2.5 MG/3ML) 0.083% IN NEBU
INHALATION_SOLUTION | RESPIRATORY_TRACT | Status: AC
  Administered 2017-05-23: 2.5 mg via RESPIRATORY_TRACT
  Filled 2017-05-23: qty 3

## 2017-05-23 MED ORDER — LABETALOL HCL 5 MG/ML IV SOLN
INTRAVENOUS | Status: AC
Start: 2017-05-23 — End: 2017-05-24
  Filled 2017-05-23: qty 4

## 2017-05-23 MED ORDER — METOPROLOL TARTRATE 5 MG/5ML IV SOLN: 2.0000 mg | INTRAVENOUS | Status: DC | PRN

## 2017-05-23 MED ORDER — SODIUM CHLORIDE 0.9 % IV SOLN
INTRAVENOUS | Status: AC
  Filled 2017-05-23: qty 1.2

## 2017-05-23 MED ORDER — SODIUM CHLORIDE 0.9 % IV SOLN
INTRAVENOUS | Status: DC
  Administered 2017-05-23: 09:00:00 via INTRAVENOUS

## 2017-05-23 MED ORDER — FENTANYL CITRATE (PF) 250 MCG/5ML IJ SOLN
INTRAMUSCULAR | Status: AC
  Filled 2017-05-23: qty 5

## 2017-05-23 MED ORDER — ACETAMINOPHEN 325 MG RE SUPP
325.0000 mg | RECTAL | Status: DC | PRN
  Filled 2017-05-23: qty 2

## 2017-05-23 MED ORDER — ALBUTEROL SULFATE (2.5 MG/3ML) 0.083% IN NEBU
2.5000 mg | INHALATION_SOLUTION | RESPIRATORY_TRACT | Status: DC | PRN
  Administered 2017-05-23: 2.5 mg via RESPIRATORY_TRACT

## 2017-05-23 MED ORDER — PHENYLEPHRINE 40 MCG/ML (10ML) SYRINGE FOR IV PUSH (FOR BLOOD PRESSURE SUPPORT)
PREFILLED_SYRINGE | INTRAVENOUS | Status: DC | PRN
  Administered 2017-05-23: 80 ug via INTRAVENOUS
  Administered 2017-05-23: 160 ug via INTRAVENOUS
  Administered 2017-05-23 (×2): 80 ug via INTRAVENOUS

## 2017-05-23 MED ORDER — LIDOCAINE HCL (PF) 1 % IJ SOLN
INTRAMUSCULAR | Status: DC | PRN
  Administered 2017-05-23: 7 mL

## 2017-05-23 MED ORDER — PANTOPRAZOLE SODIUM 20 MG PO TBEC
20.0000 mg | DELAYED_RELEASE_TABLET | Freq: Every day | ORAL | Status: DC
  Administered 2017-05-24: 20 mg via ORAL

## 2017-05-23 MED ORDER — OXYCODONE-ACETAMINOPHEN 5-325 MG PO TABS
1.0000 | ORAL_TABLET | ORAL | Status: DC | PRN
  Administered 2017-05-24: 1 via ORAL
  Filled 2017-05-23: qty 1

## 2017-05-23 MED ORDER — ONDANSETRON HCL 4 MG/2ML IJ SOLN
4.0000 mg | Freq: Four times a day (QID) | INTRAMUSCULAR | Status: DC | PRN
  Administered 2017-05-24: 4 mg via INTRAVENOUS

## 2017-05-23 MED ORDER — LABETALOL HCL 5 MG/ML IV SOLN
10.0000 mg | INTRAVENOUS | Status: DC | PRN
  Administered 2017-05-23 – 2017-05-24 (×3): 10 mg via INTRAVENOUS
  Filled 2017-05-23 (×3): qty 4

## 2017-05-23 MED ORDER — CITALOPRAM HYDROBROMIDE 20 MG PO TABS
20.0000 mg | ORAL_TABLET | Freq: Every day | ORAL | Status: DC
  Administered 2017-05-24: 20 mg via ORAL
  Filled 2017-05-23: qty 1

## 2017-05-23 MED ORDER — DOCUSATE SODIUM 100 MG PO CAPS
100.0000 mg | ORAL_CAPSULE | Freq: Two times a day (BID) | ORAL | Status: DC
  Administered 2017-05-23 – 2017-05-24 (×2): 100 mg via ORAL
  Filled 2017-05-23 (×2): qty 1

## 2017-05-23 MED ORDER — MIRTAZAPINE 7.5 MG PO TABS
7.5000 mg | ORAL_TABLET | Freq: Every day | ORAL | Status: DC
  Administered 2017-05-23 – 2017-05-24 (×2): 7.5 mg via ORAL
  Filled 2017-05-23 (×2): qty 1

## 2017-05-23 MED ORDER — HYDRALAZINE HCL 20 MG/ML IJ SOLN
5.0000 mg | Freq: Once | INTRAMUSCULAR | Status: AC
  Administered 2017-05-23: 5 mg via INTRAVENOUS

## 2017-05-23 MED ORDER — CEFAZOLIN SODIUM-DEXTROSE 2-4 GM/100ML-% IV SOLN
2.0000 g | INTRAVENOUS | Status: AC
  Administered 2017-05-23: 2 g via INTRAVENOUS
  Filled 2017-05-23: qty 100

## 2017-05-23 MED ORDER — PROTAMINE SULFATE 10 MG/ML IV SOLN
INTRAVENOUS | Status: DC | PRN
  Administered 2017-05-23 (×5): 10 mg via INTRAVENOUS

## 2017-05-23 MED ORDER — HEPARIN SODIUM (PORCINE) 1000 UNIT/ML IJ SOLN
INTRAMUSCULAR | Status: DC | PRN
  Administered 2017-05-23: 5000 [IU] via INTRAVENOUS

## 2017-05-23 MED ORDER — LIDOCAINE 2% (20 MG/ML) 5 ML SYRINGE
INTRAMUSCULAR | Status: AC
Start: 2017-05-23 — End: ?
  Filled 2017-05-23: qty 5

## 2017-05-23 MED ORDER — ISOSORBIDE MONONITRATE ER 60 MG PO TB24
60.0000 mg | ORAL_TABLET | Freq: Every day | ORAL | Status: DC
  Administered 2017-05-24: 60 mg via ORAL
  Filled 2017-05-23: qty 1

## 2017-05-23 MED ORDER — GUAIFENESIN-DM 100-10 MG/5ML PO SYRP: 15.0000 mL | ORAL_SOLUTION | ORAL | Status: DC | PRN

## 2017-05-23 MED ORDER — ASPIRIN EC 81 MG PO TBEC
81.0000 mg | DELAYED_RELEASE_TABLET | Freq: Every day | ORAL | Status: DC
  Administered 2017-05-24: 81 mg via ORAL

## 2017-05-23 MED ORDER — LIDOCAINE HCL (PF) 1 % IJ SOLN
INTRAMUSCULAR | Status: AC
Start: 2017-05-23 — End: ?
  Filled 2017-05-23: qty 30

## 2017-05-23 MED ORDER — DILTIAZEM HCL ER COATED BEADS 180 MG PO CP24
180.0000 mg | ORAL_CAPSULE | Freq: Every day | ORAL | Status: DC
  Administered 2017-05-24: 180 mg via ORAL
  Filled 2017-05-23: qty 1

## 2017-05-23 MED ORDER — SODIUM CHLORIDE 0.9 % IV SOLN
INTRAVENOUS | Status: DC | PRN
  Administered 2017-05-23: 12:00:00

## 2017-05-23 MED ORDER — PHENOL 1.4 % MT LIQD: 1.0000 | OROMUCOSAL | Status: DC | PRN

## 2017-05-23 MED ORDER — ROCURONIUM BROMIDE 50 MG/5ML IV SOLN
INTRAVENOUS | Status: AC
Start: 2017-05-23 — End: ?
  Filled 2017-05-23: qty 1

## 2017-05-23 MED ORDER — MORPHINE SULFATE (PF) 2 MG/ML IV SOLN: 2.0000 mg | INTRAVENOUS | Status: DC | PRN

## 2017-05-23 MED ORDER — SUCCINYLCHOLINE CHLORIDE 200 MG/10ML IV SOSY
PREFILLED_SYRINGE | INTRAVENOUS | Status: AC
Start: 2017-05-23 — End: ?
  Filled 2017-05-23: qty 10

## 2017-05-23 MED ORDER — HYDRALAZINE HCL 20 MG/ML IJ SOLN
INTRAMUSCULAR | Status: AC
Start: 2017-05-23 — End: 2017-05-24
  Filled 2017-05-23: qty 1

## 2017-05-23 MED ORDER — ACETAMINOPHEN 325 MG PO TABS: 325.0000 mg | ORAL_TABLET | ORAL | Status: DC | PRN

## 2017-05-23 MED ORDER — FENTANYL CITRATE (PF) 100 MCG/2ML IJ SOLN: 25.0000 ug | INTRAMUSCULAR | Status: DC | PRN

## 2017-05-23 MED ORDER — HYDRALAZINE HCL 20 MG/ML IJ SOLN
INTRAMUSCULAR | Status: AC
  Filled 2017-05-23: qty 1

## 2017-05-23 MED ORDER — HYDRALAZINE HCL 20 MG/ML IJ SOLN
5.0000 mg | INTRAMUSCULAR | Status: AC | PRN
  Administered 2017-05-23 (×2): 5 mg via INTRAVENOUS

## 2017-05-23 SURGICAL SUPPLY — 32 items
ARMBAND PINK RESTRICT EXTREMIT (MISCELLANEOUS) ×4 IMPLANT
CANISTER SUCT 3000ML PPV (MISCELLANEOUS) ×2 IMPLANT
CANNULA VESSEL 3MM 2 BLNT TIP (CANNULA) ×2 IMPLANT
CLIP VESOCCLUDE MED 6/CT (CLIP) ×2 IMPLANT
CLIP VESOCCLUDE SM WIDE 6/CT (CLIP) ×2 IMPLANT
COVER PROBE W GEL 5X96 (DRAPES) IMPLANT
DECANTER SPIKE VIAL GLASS SM (MISCELLANEOUS) ×2 IMPLANT
DERMABOND ADVANCED (GAUZE/BANDAGES/DRESSINGS) ×1
DERMABOND ADVANCED .7 DNX12 (GAUZE/BANDAGES/DRESSINGS) ×1 IMPLANT
DRAIN PENROSE 1/4X12 LTX STRL (WOUND CARE) ×2 IMPLANT
ELECT REM PT RETURN 9FT ADLT (ELECTROSURGICAL) ×2
ELECTRODE REM PT RTRN 9FT ADLT (ELECTROSURGICAL) ×1 IMPLANT
GLOVE BIO SURGEON STRL SZ7.5 (GLOVE) ×2 IMPLANT
GOWN STRL REUS W/ TWL LRG LVL3 (GOWN DISPOSABLE) ×3 IMPLANT
GOWN STRL REUS W/TWL LRG LVL3 (GOWN DISPOSABLE) ×6
KIT BASIN OR (CUSTOM PROCEDURE TRAY) ×2 IMPLANT
KIT TURNOVER KIT B (KITS) ×2 IMPLANT
LOOP VESSEL MINI RED (MISCELLANEOUS) IMPLANT
NS IRRIG 1000ML POUR BTL (IV SOLUTION) ×2 IMPLANT
PACK CV ACCESS (CUSTOM PROCEDURE TRAY) ×2 IMPLANT
PAD ARMBOARD 7.5X6 YLW CONV (MISCELLANEOUS) ×4 IMPLANT
SPONGE SURGIFOAM ABS GEL 100 (HEMOSTASIS) IMPLANT
SUT PROLENE 6 0 BV (SUTURE) ×2 IMPLANT
SUT PROLENE 7 0 BV 1 (SUTURE) ×4 IMPLANT
SUT SILK 3 0 (SUTURE) ×1
SUT SILK 3-0 18XBRD TIE 12 (SUTURE) ×1 IMPLANT
SUT VIC AB 3-0 SH 27 (SUTURE) ×1
SUT VIC AB 3-0 SH 27X BRD (SUTURE) ×1 IMPLANT
SUT VICRYL 4-0 PS2 18IN ABS (SUTURE) ×2 IMPLANT
TOWEL GREEN STERILE (TOWEL DISPOSABLE) ×2 IMPLANT
UNDERPAD 30X30 (UNDERPADS AND DIAPERS) ×2 IMPLANT
WATER STERILE IRR 1000ML POUR (IV SOLUTION) ×2 IMPLANT

## 2017-05-23 NOTE — Anesthesia Preprocedure Evaluation (Addendum)
Anesthesia Evaluation  Patient identified by MRN, date of birth, ID band Patient awake    Reviewed: Allergy & Precautions, H&P , NPO status , Patient's Chart, lab work & pertinent test results  Airway Mallampati: II  TM Distance: >3 FB Neck ROM: Full    Dental no notable dental hx. (+) Teeth Intact, Dental Advisory Given   Pulmonary COPD,  COPD inhaler and oxygen dependent,    Pulmonary exam normal breath sounds clear to auscultation       Cardiovascular hypertension, Pt. on medications +CHF  + Valvular Problems/Murmurs AS  Rhythm:Regular Rate:Normal + Systolic murmurs    Neuro/Psych Anxiety Depression CVA, Residual Symptoms    GI/Hepatic Neg liver ROS, GERD  Medicated and Controlled,  Endo/Other  diabetesHypothyroidism   Renal/GU ESRF and DialysisRenal disease  negative genitourinary   Musculoskeletal  (+) Arthritis , Osteoarthritis,    Abdominal   Peds  Hematology negative hematology ROS (+) anemia ,   Anesthesia Other Findings   Reproductive/Obstetrics negative OB ROS                            Anesthesia Physical Anesthesia Plan  ASA: III  Anesthesia Plan: MAC   Post-op Pain Management:    Induction: Intravenous  PONV Risk Score and Plan: 3 and Ondansetron, Propofol infusion and Treatment may vary due to age or medical condition  Airway Management Planned: Simple Face Mask  Additional Equipment:   Intra-op Plan:   Post-operative Plan:   Informed Consent: I have reviewed the patients History and Physical, chart, labs and discussed the procedure including the risks, benefits and alternatives for the proposed anesthesia with the patient or authorized representative who has indicated his/her understanding and acceptance.   Dental advisory given  Plan Discussed with: CRNA  Anesthesia Plan Comments:         Anesthesia Quick Evaluation

## 2017-05-23 NOTE — Interval H&P Note (Signed)
History and Physical Interval Note:  05/23/2017 10:40 AM  [name]  has presented today for surgery, with the diagnosis of end stage renal disease  The various methods of treatment have been discussed with the patient and family. After consideration of risks, benefits and other options for treatment, the patient has consented to  Procedure(s): ARTERIOVENOUS (AV) BRACHIOCEPHALIC FISTULA CREATION (Right) as a surgical intervention .  The patient's history has been reviewed, patient examined, no change in status, stable for surgery.  I have reviewed the patient's chart and labs.  Questions were answered to the patient's satisfaction.     Ruta Hinds

## 2017-05-23 NOTE — Progress Notes (Signed)
[facility] [place]  Subjective  - feels ok   Objective (!) 187/99 77 98.3 F (36.8 C) 16 90%  Intake/Output Summary (Last 24 hours) at 05/23/2017 1416 Last data filed at 05/23/2017 1255 Gross per 24 hour  Intake 700 ml  Output 50 ml  Net 650 ml   Right upper extremity + thrill in fistula 2+ radial pulse  Assessment/Planning: Awaiting room HD tomorrow morning then d/c home  Ruta Hinds 05/23/2017 2:16 PM --  Laboratory Lab Results: Recent Labs    05/23/17 0848  HGB 10.9*  HCT 32.0*   BMET Recent Labs    05/23/17 0848  NA 137  K 5.2*  GLUCOSE 101*    COAG Lab Results  Component Value Date   INR 1.19 04/05/2017   INR 1.08 05/06/2016   INR 1.13 05/26/2010   No results found for: PTT

## 2017-05-23 NOTE — Op Note (Signed)
Procedure: Right Brachial Cephalic AV fistula  Preop: ESRD  Postop: ESRD  Anesthesia: Local with sedation  Assistant: Arlee Muslim PA-C  Findings: 3 mm cephalic vein  Procedure: After obtaining informed consent, the patient was taken to the operating room.  After adequate sedation, the right upper extremity was prepped and draped in usual sterile fashion.  Local anesthesia was infiltrated near the antecubital crease.  A transverse incision was then made near the antecubital crease the right arm. The incision was carried into the subcutaneous tissues down to level of the cephalic vein. The cephalic vein was approximately 3 mm in diameter. It was of good quality. This was dissected free circumferentially and small side branches ligated and divided between silk ties or clips. Next the brachial artery was dissected free in the medial portion of the incision. The artery was  3 mm in diameter. The vessel loops were placed proximal and distal to the planned site of arteriotomy. The patient was given 5000 units of intravenous heparin. After appropriate circulation time, the vessel loops were used to control the artery. A longitudinal opening was made in the brachial artery.  The vein was ligated distally with a 2-0 silk tie. The vein was controlled proximally with a fine bulldog clamp. The vein was then swung over to the artery and sewn end of vein to side of artery using a running 7-0 Prolene suture. Just prior to completion of the anastomosis, everything was fore bled back bled and thoroughly flushed. The anastomosis was secured, vessel loops released, and there was a palpable thrill in the fistula immediately. The patient was given 50 mg of protamine.  After hemostasis was obtained, the subcutaneous tissues were reapproximated using a running 3-0 Vicryl suture. The skin was then closed with a 4 0 Vicryl subcuticular stitch. Dermabond was applied to the skin incision.  The patient had a palpable radial pulse  at the end of the case.  Ruta Hinds, MD [facility] [place] Office: [phone] Pager: [phone]

## 2017-05-23 NOTE — Plan of Care (Signed)

## 2017-05-23 NOTE — Transfer of Care (Signed)
Immediate Anesthesia Transfer of Care Note  Patient: [name]  Procedure(s) Performed: RIGHT ARTERIOVENOUS (AV) BRACHIOCEPHALIC FISTULA CREATION (Right Arm Upper)  Patient Location: PACU  Anesthesia Type:MAC  Level of Consciousness: sedated and responds to stimulation  Airway & Oxygen Therapy: Patient Spontanous Breathing and Patient connected to nasal cannula oxygen  Post-op Assessment: Report given to RN, Post -op Vital signs reviewed and stable and Patient moving all extremities  Post vital signs: Reviewed and stable  Last Vitals:  Vitals Value Taken Time  BP 190/84 05/23/2017 12:51 PM  Temp    Pulse 69 05/23/2017 12:54 PM  Resp 19 05/23/2017 12:54 PM  SpO2 100 % 05/23/2017 12:54 PM  Vitals shown include unvalidated device data.  Last Pain:  Vitals:   05/23/17 0830  TempSrc:   PainSc: 0-No pain         Complications: No apparent anesthesia complications

## 2017-05-23 NOTE — Progress Notes (Addendum)
[name] is an 84 Y/O female with ESRD on hemodialysis T,Th,S at [facility]. She was admitted today  for R brachial cephalic fistula per Dr. Oneida Alar. She will stay overnight, have HD in AM and be Dc'd home. She currently has no complaints, other than being tired.   HD Orders: [facility] MWF 3.5 hr  350/600 74.5 kg 2.0 K/2.5 Ca RIJ TDC -Heparin 2000 units IV then 500 units IV hourly -Hectorol 4.5 mcg IV TIW -Epogen 4600 units IV TIW  Juanell Fairly, [facility] [phone] (Pager)  Pt seen, examined and agree w A/P as above.  Kelly Splinter MD [employer] pager [phone]   05/24/2017, 11:59 AM

## 2017-05-23 NOTE — Anesthesia Postprocedure Evaluation (Signed)
Anesthesia Post Note  Patient: [name]  Procedure(s) Performed: RIGHT ARTERIOVENOUS (AV) BRACHIOCEPHALIC FISTULA CREATION (Right Arm Upper)     Patient location during evaluation: PACU Anesthesia Type: MAC Level of consciousness: awake and alert Pain management: pain level controlled Vital Signs Assessment: post-procedure vital signs reviewed and stable Respiratory status: spontaneous breathing, nonlabored ventilation and respiratory function stable Cardiovascular status: stable and blood pressure returned to baseline Postop Assessment: no apparent nausea or vomiting Anesthetic complications: no    Last Vitals:  Vitals:   05/23/17 1330 05/23/17 1338  BP: (!) 192/88 (!) 193/79  Pulse: 77   Resp: 16   Temp:    SpO2: 92%     Last Pain:  Vitals:   05/23/17 1250  TempSrc:   PainSc: 0-No pain                 Scot Shiraishi,W. EDMOND

## 2017-05-24 ENCOUNTER — Encounter (HOSPITAL_COMMUNITY): Payer: Self-pay | Admitting: Vascular Surgery

## 2017-05-24 ENCOUNTER — Telehealth: Payer: Self-pay | Admitting: Vascular Surgery

## 2017-05-24 ENCOUNTER — Observation Stay (HOSPITAL_COMMUNITY): Payer: Medicare HMO

## 2017-05-24 DIAGNOSIS — I5032 Chronic diastolic (congestive) heart failure: Secondary | ICD-10-CM | POA: Diagnosis not present

## 2017-05-24 DIAGNOSIS — K219 Gastro-esophageal reflux disease without esophagitis: Secondary | ICD-10-CM | POA: Diagnosis not present

## 2017-05-24 DIAGNOSIS — J9610 Chronic respiratory failure, unspecified whether with hypoxia or hypercapnia: Secondary | ICD-10-CM | POA: Diagnosis not present

## 2017-05-24 DIAGNOSIS — N186 End stage renal disease: Secondary | ICD-10-CM | POA: Diagnosis not present

## 2017-05-24 DIAGNOSIS — Z992 Dependence on renal dialysis: Secondary | ICD-10-CM | POA: Diagnosis not present

## 2017-05-24 DIAGNOSIS — F0390 Unspecified dementia without behavioral disturbance: Secondary | ICD-10-CM | POA: Diagnosis not present

## 2017-05-24 DIAGNOSIS — I132 Hypertensive heart and chronic kidney disease with heart failure and with stage 5 chronic kidney disease, or end stage renal disease: Secondary | ICD-10-CM | POA: Diagnosis not present

## 2017-05-24 DIAGNOSIS — E1122 Type 2 diabetes mellitus with diabetic chronic kidney disease: Secondary | ICD-10-CM | POA: Diagnosis not present

## 2017-05-24 DIAGNOSIS — J449 Chronic obstructive pulmonary disease, unspecified: Secondary | ICD-10-CM | POA: Diagnosis not present

## 2017-05-24 DIAGNOSIS — K224 Dyskinesia of esophagus: Secondary | ICD-10-CM | POA: Diagnosis not present

## 2017-05-24 LAB — CBC
HCT: 33.8 % — ABNORMAL LOW (ref 36.0–46.0)
HCT: 31.1 % — ABNORMAL LOW (ref 36.0–46.0)
Hemoglobin: 11 g/dL — ABNORMAL LOW (ref 12.0–15.0)
Hemoglobin: 9.7 g/dL — ABNORMAL LOW (ref 12.0–15.0)
MCH: 25.5 pg — AB (ref 26.0–34.0)
MCH: 24.6 pg — ABNORMAL LOW (ref 26.0–34.0)
MCHC: 32.5 g/dL (ref 30.0–36.0)
MCHC: 31.2 g/dL (ref 30.0–36.0)
MCV: 78.2 fL (ref 78.0–100.0)
MCV: 78.7 fL (ref 78.0–100.0)
PLATELETS: 137 10*3/uL — AB (ref 150–400)
Platelets: 160 K/uL (ref 150–400)
RBC: 4.32 MIL/uL (ref 3.87–5.11)
RBC: 3.95 MIL/uL (ref 3.87–5.11)
RDW: 16.4 % — ABNORMAL HIGH (ref 11.5–15.5)
RDW: 15.7 % — ABNORMAL HIGH (ref 11.5–15.5)
WBC: 8.5 10*3/uL (ref 4.0–10.5)
WBC: 9.3 K/uL (ref 4.0–10.5)

## 2017-05-24 LAB — BASIC METABOLIC PANEL
Anion gap: 16 — ABNORMAL HIGH (ref 5–15)
BUN: 90 mg/dL — AB (ref 6–20)
CO2: 24 mmol/L (ref 22–32)
CREATININE: 10.89 mg/dL — AB (ref 0.44–1.00)
Calcium: 8.9 mg/dL (ref 8.9–10.3)
Chloride: 98 mmol/L — ABNORMAL LOW (ref 101–111)
GFR, EST AFRICAN AMERICAN: 3 mL/min — AB (ref >60)
GFR, EST NON AFRICAN AMERICAN: 3 mL/min — AB (ref >60)
Glucose, Bld: 113 mg/dL — ABNORMAL HIGH (ref 65–99)
Potassium: 5.9 mmol/L — ABNORMAL HIGH (ref 3.5–5.1)
SODIUM: 138 mmol/L (ref 135–145)

## 2017-05-24 MED ORDER — MORPHINE SULFATE (PF) 4 MG/ML IV SOLN
INTRAVENOUS | Status: AC
  Filled 2017-05-24: qty 1

## 2017-05-24 MED ORDER — MORPHINE SULFATE (PF) 4 MG/ML IV SOLN
2.0000 mg | Freq: Once | INTRAVENOUS | Status: AC
  Administered 2017-05-24: 2 mg via INTRAVENOUS

## 2017-05-24 MED ORDER — HEPARIN SODIUM (PORCINE) 1000 UNIT/ML DIALYSIS: 3500.0000 [IU] | Freq: Once | INTRAMUSCULAR | Status: DC

## 2017-05-24 MED ORDER — ONDANSETRON HCL 4 MG/2ML IJ SOLN
INTRAMUSCULAR | Status: AC
  Filled 2017-05-24: qty 2

## 2017-05-24 MED ORDER — HEPARIN SODIUM (PORCINE) 1000 UNIT/ML DIALYSIS: 1000.0000 [IU] | INTRAMUSCULAR | Status: DC | PRN

## 2017-05-24 MED ORDER — LIDOCAINE-PRILOCAINE 2.5-2.5 % EX CREA: 1.0000 "application " | TOPICAL_CREAM | CUTANEOUS | Status: DC | PRN

## 2017-05-24 MED ORDER — MORPHINE SULFATE (PF) 2 MG/ML IV SOLN: 2.0000 mg | Freq: Once | INTRAVENOUS | Status: DC

## 2017-05-24 MED ORDER — LIDOCAINE HCL (PF) 1 % IJ SOLN: 5.0000 mL | INTRAMUSCULAR | Status: DC | PRN

## 2017-05-24 MED ORDER — PENTAFLUOROPROP-TETRAFLUOROETH EX AERO: 1.0000 "application " | INHALATION_SPRAY | CUTANEOUS | Status: DC | PRN

## 2017-05-24 MED ORDER — ALTEPLASE 2 MG IJ SOLR: 2.0000 mg | Freq: Once | INTRAMUSCULAR | Status: DC | PRN

## 2017-05-24 MED ORDER — SODIUM CHLORIDE 0.9 % IV SOLN: 100.0000 mL | INTRAVENOUS | Status: DC | PRN

## 2017-05-24 MED ORDER — TRAMADOL HCL 50 MG PO TABS: 50.0000 mg | ORAL_TABLET | Freq: Two times a day (BID) | ORAL | 0 refills | Status: DC | PRN

## 2017-05-24 NOTE — Clinical Social Work Note (Signed)
Clinical Social Work Assessment  Patient Details  Name: [name] MRN: [id number] Date of Birth: [birth year]  Date of referral:  05/24/17               Reason for consult:  Discharge Planning                Permission sought to share information with:  Family Supports Permission granted to share information::     Name::     [name]  Agency::     Relationship::  Daughter  Contact Information:  [phone]  Housing/Transportation Living arrangements for the past 2 months:  Assisted Living Facility([facility]) Source of Information:  Adult Children Patient Interpreter Needed:  None Criminal Activity/Legal Involvement Pertinent to Current Situation/Hospitalization:  No - Comment as needed Significant Relationships:  Adult Children Lives with:  Facility Resident Do you feel safe going back to the place where you live?  Yes Need for family participation in patient care:     Care giving concerns:  Daughter expressed no concerns regarding patient's care at [place].   Social Worker assessment / plan: CSW talked with daughter [name] by phone and confirmed that patient from [facility] and will return. Daughter advised that patient has HD orders for this afternoon. She indicated that her brother, Jeanene Erb lives in [place] and will pick patient up from the hospital.  **Daughter later advised when patient was taken to HD and when she would be ready for pick-up (between 7:00-7:30 pm by nursing).  Employment status:  Retired Forensic scientist:  Medicaid In [place], [employer] PT Recommendations:  Not assessed at this time Information / Referral to community resources:  Other (Comment Required)(None needed or requested as patient returning to ALF in [place], [place])  Patient/Family's Response to care:  Daughter expressed no concerns regarding patient's care during hospitalization.  Patient/Family's Understanding of and Emotional Response to Diagnosis,  Current Treatment, and Prognosis:  Ms. Danne Baxter appeared to have an understanding of patient's medical issues and need for hospitalization.  Emotional Assessment Appearance:  Other (Comment Required(Did not visit with patient, talked with daughter by phone) Attitude/Demeanor/Rapport:  Unable to Assess Affect (typically observed):  Unable to Assess Orientation:  Oriented to Self, Oriented to Situation, Oriented to Place, Oriented to  Time Alcohol / Substance use:  Never Used Psych involvement (Current and /or in the community):  No (Comment)  Discharge Needs  Concerns to be addressed:  Discharge Planning Concerns Readmission within the last 30 days:  No Current discharge risk:  None Barriers to Discharge:  No Barriers Identified   Paula Feil, LCSW 05/24/2017, 6:36 PM

## 2017-05-24 NOTE — Progress Notes (Signed)
Pts oxygen tank was just below a quarter and pt on 2L, family instructed to go to the facility first and also made aware of pts RX being sent with them.

## 2017-05-24 NOTE — Care Management Note (Addendum)
Case Management Note  Patient Details  Name: [name] MRN: [id number] Date of Birth: March 27, 1925  Subjective/Objective:   History of stroke, ESRD on hemodialysis T,Th,S at [facility]. Admitted s/p  R brachial cephalic fistula.            Action/Plan: Prior to admission patient lived at [facility].  At discharge patient plans to discharge to same living situation.  Patient states she has transportation at discharge (son or daughter); family driving from [place]. No discharge needs noted at this time Expected Discharge Date:  05/24/17               Expected Discharge Plan:  [name]  In-House Referral:  Clinical Social Work  Discharge planning Services  CM Consult  Status of Service:  Completed, signed off  Kristen Cardinal, RN 05/24/2017, 10:16 AM

## 2017-05-24 NOTE — Progress Notes (Signed)
Report given to Transport planner at [employer]. Family to arrive around 7-7:30pm to transport patient to [place].

## 2017-05-24 NOTE — Progress Notes (Signed)
[facility] [place]  Subjective  - feels a little short of breath today   Objective (!) 208/90 89 98 F (36.7 C) 17 99%  Intake/Output Summary (Last 24 hours) at 05/24/2017 0747 Last data filed at 05/24/2017 0200 Gross per 24 hour  Intake 700 ml  Output 50 ml  Net 650 ml   Right upper extremity: incision intact no hematoma + thrill Chest CTA bilat no wheeze   Assessment/Planning: Patent right arm AVF D/c home post dialysis today  Ruta Hinds 05/24/2017 7:47 AM --  Laboratory Lab Results: Recent Labs    05/23/17 0848  HGB 10.9*  HCT 32.0*   BMET Recent Labs    05/23/17 0848  NA 137  K 5.2*  GLUCOSE 101*    COAG Lab Results  Component Value Date   INR 1.19 04/05/2017   INR 1.08 05/06/2016   INR 1.13 05/26/2010   No results found for: PTT

## 2017-05-24 NOTE — Telephone Encounter (Signed)
-----   Message from Elam Dutch, MD sent at 05/23/2017 12:46 PM EDT -----  Catalina Antigua asst  Right Brachial Cephalic AV fistula  Pt needs follow up with PA clinic with [place] in 4-6 weeks  She is being admitted for 23 hr obs please put on [employer]

## 2017-05-24 NOTE — Progress Notes (Signed)
Pt being discharged after dialysis. Nurse giving report from dialysis stated she was complaining of stomach pain the whole time she was down there. Assessing pt when she arrived and pt stated it had stopped it was just her nerves. Son here to get pt, leaving in wheel chair and home O2 tank. Vital signs stable and no complaints of pain. Belongings taken with family and pt educated on AVS and medications. Day shift nurse gave report to [facility].

## 2017-05-24 NOTE — Discharge Summary (Addendum)
Discharge Summary    [name] 05-10-1925 84 y.o. female  [id number]  Admission Date: 05/23/2017  Discharge Date: 05/24/17  Physician: Elam Dutch, MD  Admission Diagnosis: end stage renal disease   HPI:   This is a 84 y.o. female referred for placement of a new hemodialysis access.  She previously had a left brachiocephalic AV fistula which lasted for 6 years.  It is now occluded.  She is currently dialyzing via right sided catheter.  Her dialysis today is Tuesday Thursday Saturday at [facility] hemodialysis center.  She currently resides in a skilled nursing facility.  She has had a prior stroke which affected her right side but this has recovered.  She is right-handed.  Review of systems: She has shortness of breath with exertion but otherwise is very functional.  She denies chest pain.   Hospital Course:  The patient was admitted to the hospital and taken to the operating room on 05/23/2017 and underwent: Right brachial cephalic AV fistula.    The pt tolerated the procedure well and was transported to the PACU in good condition.   She was admitted to the hospital post surgery.   On POD 1, she is doing well with incision in tact and AVF is patent with +thrill.  She is discharged back to SNF after hemodialysis.  The remainder of the hospital course consisted of increasing mobilization and increasing intake of solids without difficulty.  CBC    Component Value Date/Time   WBC 8.5 05/24/2017 0712   RBC 4.32 05/24/2017 0712   HGB 11.0 (L) 05/24/2017 0712   HCT 33.8 (L) 05/24/2017 0712   PLT 137 (L) 05/24/2017 0712   MCV 78.2 05/24/2017 0712   MCH 25.5 (L) 05/24/2017 0712   MCHC 32.5 05/24/2017 0712   RDW 16.4 (H) 05/24/2017 0712   LYMPHSABS 1.0 04/14/2017 0914   MONOABS 0.4 04/14/2017 0914   EOSABS 0.0 04/14/2017 0914   BASOSABS 0.0 04/14/2017 0914    BMET    Component Value Date/Time   NA 138 05/24/2017 0712   K 5.9 (H) 05/24/2017 0712   CL 98 (L)  05/24/2017 0712   CO2 24 05/24/2017 0712   GLUCOSE 113 (H) 05/24/2017 0712   BUN 90 (H) 05/24/2017 0712   CREATININE 10.89 (H) 05/24/2017 0712   CREATININE 6.94 (H) 06/01/2016 1444   CALCIUM 8.9 05/24/2017 0712   CALCIUM 7.2 (L) 02/01/2011 1750   GFRNONAA 3 (L) 05/24/2017 0712   GFRNONAA 8 (L) 03/29/2013 1006   GFRAA 3 (L) 05/24/2017 0712   GFRAA 10 (L) 03/29/2013 1006      Discharge Instructions    Discharge patient   Complete by:  As directed    Discharge back to SNF after HD.   Discharge disposition:  03-Skilled Nursing Facility   Discharge patient date:  05/24/2017      Discharge Diagnosis:  end stage renal disease  Secondary Diagnosis: Patient Active Problem List   Diagnosis Date Noted  . Problem with vascular access 04/07/2017  . ESRD needing dialysis ([place]) 04/05/2017  . Hyperkalemia 04/05/2017  . Anemia in chronic kidney disease (CKD) 12/30/2016  . Pancreatic cyst 08/31/2016  . COPD with acute exacerbation ([place]) 08/11/2016  . Essential hypertension 08/11/2016  . Chronic pain 08/11/2016  . Esophageal dysmotility 06/01/2016  . Dysphagia   . Chronic diastolic heart failure ([place]) 10/07/2015  . Fluid overload 10/02/2015  . Hyperglycemia 10/02/2015  . Depression   . Pseudoaneurysm of arteriovenous dialysis fistula ([place]) 05/29/2015  .  Traumatic hematoma of scalp 02/12/2014  . Decreased hearing of both ears 02/12/2014  . At high risk for falls 02/12/2014  . Leg swelling 09/18/2013  . OA (osteoarthritis) of knee 09/18/2013  . MDD (major depressive disorder) ([place]) 04/05/2013  . Mechanical complication of other vascular device, implant, and graft 11/14/2012  . Pain in limb-Left arm 07/11/2012  . Hypothyroidism 06/08/2012  . GERD (gastroesophageal reflux disease) 03/23/2012  . Gait instability 01/13/2012  . Neuropathy of hand 01/13/2012  . Breast mass, right 07/11/2011  . Hemorrhoid 03/14/2011  . ESRD (end stage renal disease) on dialysis ([place]) 02/12/2011  .  Dyspnea 01/17/2011  . Chronic respiratory failure ([place]) 01/13/2011  . Chronic constipation 12/09/2010  . Insomnia 10/14/2010  . Diabetes mellitus with renal complications ([place]) 11/91/4782  . ALLERGIC RHINITIS 04/03/2008  . DEPENDENT EDEMA, LEGS, BILATERAL 11/17/2006  . HYPERLIPIDEMIA 10/10/2006  . Anemia in chronic kidney disease 10/10/2006  . Anxiety 09/05/2006  . CATARACT NOS 09/05/2006  . IBS 09/05/2006  . ARTHRITIS 09/05/2006   Past Medical History:  Diagnosis Date  . Anemia   . Anemia in CKD (chronic kidney disease)   . Anxiety   . Arthritis    gout, right shoulder  . Cancer [facility])    ?stomach cancer   . Cataracts, both eyes   . CHF (congestive heart failure) ([place])   . Depression   . Depression   . Diabetes mellitus, type 2 (HCC)    Type II - no medication  . Dyspnea   . ESRD (end stage renal disease) ([place])    [facility]  . GERD (gastroesophageal reflux disease)   . Gynecologic cancer   . HOH (hard of hearing)   . Hyperlipidemia   . Hypertension   . Hypothyroidism   . IBS (irritable bowel syndrome)    with costipation  . Intraoperative hemorrhage and hematoma of a digestive system organ or structure complicating a digestive system procedure   . Normal cardiac stress test 05/2015  . Overactive bladder   . Pneumonia    2 times in 2016  . SOB (shortness of breath)    nml spirometry 09/05/06  . Stroke [facility])    residual right side weakness and pain  . SVT (supraventricular tachycardia) ([place])   . Syncope and collapse   . Thyroid disease      Allergies as of 05/24/2017   No Known Allergies     Medication List    TAKE these medications   albuterol (2.5 MG/3ML) 0.083% nebulizer solution Commonly known as:  PROVENTIL USE 1 VIAL IN NEBULIZER 3 TIMES DAILY AS NEEDED FOR WHEEZING/SHORTNESS OF BREATH.   VENTOLIN HFA 108 (90 Base) MCG/ACT inhaler Generic drug:  albuterol INHALE (2) PUFFS EVERY 4 HOURS AS NEEDED FOR WHEEZING OR SHORTNESS  OFBREATH(COUGH).   albuterol (2.5 MG/3ML) 0.083% nebulizer solution Commonly known as:  PROVENTIL INHALE 1 VIAL VIA NEBULIER 3 TIMES A DAY FOR 7 DAYS FOR SHORTNESS OF BREATH AND THEN D/C   ARTIFICIAL TEARS 1.4 % ophthalmic solution Generic drug:  polyvinyl alcohol INSTILL 1 DROP INTO EITHER EYE UP TO 6 TIMES DAILY AS NEEDED FOR LUBRICATION   aspirin EC 81 MG tablet TAKE (1) TABLET BY MOUTH ONCE DAILY.   citalopram 20 MG tablet Commonly known as:  CELEXA TAKE (1) TABLET BY MOUTH ONCE IN THE MORNING.   diltiazem 180 MG 24 hr capsule Commonly known as:  CARDIZEM CD TAKE 1 CAPSULE BY MOUTH ONCE A DAY.   docusate  sodium 100 MG capsule Commonly known as:  COLACE TAKE 1 CAPSULE BY MOUTH TWICE DAILY.   ipratropium 0.02 % nebulizer solution Commonly known as:  ATROVENT USE 1 VIAL IN NEBULIZER 3 TIMES DAILY AS NEEDED FOR WHEEZING/SHORTNESS OF BREATH.   isosorbide mononitrate 60 MG 24 hr tablet Commonly known as:  IMDUR TAKE (1) TABLET BY MOUTH ONCE DAILY.   levothyroxine 25 MCG tablet Commonly known as:  SYNTHROID, LEVOTHROID TAKE (1) TABLET BY MOUTH ONCE DAILY BEFORE BREAKFAST.   lidocaine-prilocaine cream Commonly known as:  EMLA APPLY TOPICALLY TO LEFT UPPER ARM 1 HOUR PRIOR TO DIALYSIS ON MONDAY,WEDNESDAY & FRIDAYS AS NEEDED FOR PAIN. WRAP WITH PLASTIC WRAP AFTER A   MIRALAX powder Generic drug:  polyethylene glycol powder Take 17 g by mouth daily.   mirtazapine 15 MG tablet Commonly known as:  REMERON TAKE 1/2 TABLET (7.5MG ) BY MOUTH AT BEDTIME FOR MOOD/APPETITE.   nitroGLYCERIN 0.4 MG SL tablet Commonly known as:  NITROSTAT DISSOLVE 1 TABLET UNDER THE TONGUE EVERY 5 MINUTES X2 DOSES THEN TO ER   nystatin powder Commonly known as:  MYCOSTATIN/NYSTOP Apply topically 4 (four) times daily as needed. To groin/ abd folds for irritation   OXYGEN Inhale 2 L into the lungs continuous.   pantoprazole 40 MG tablet Commonly known as:  PROTONIX TAKE (1) TABLET BY MOUTH  ONCE DAILY.   SENSIPAR 30 MG tablet Generic drug:  cinacalcet TAKE 1 TABLET BY MOUTH ONCE DAILY AFTER THE EVENING MEAL *PROVIDED BYDAVITA DIALYSIS*   sevelamer carbonate 800 MG tablet Commonly known as:  RENVELA Take 800-2,400 mg by mouth See admin instructions. Take 2400 mg by mouth three times daily with meals and take 800 mg by mouth once daily in the evening with snack.   TAB-A-VITE PO Take 1 tablet by mouth daily.   traMADol 50 MG tablet Commonly known as:  ULTRAM Take 1 tablet (50 mg total) by mouth every 12 (twelve) hours as needed for moderate pain.   TUSSIN DM 10-100 MG/5ML liquid Generic drug:  Dextromethorphan-guaiFENesin TAKE ONE TEASPOONFUL BY MOUTH EVERY 4 HOURS AS NEEDED FOR COUGH     Nystatin powder is 100,000U   Prescriptions given: Tramadol #8 No Refill  Instructions:   [facility]  Discharge Instructions  AV Fistula or Graft Surgery for Dialysis Access  Please refer to the following instructions for your post-procedure care. Your surgeon or physician assistant will discuss any changes with you.  Activity  You may drive the day following your surgery, if you are comfortable and no longer taking prescription pain medication. Resume full activity as the soreness in your incision resolves.  Bathing/Showering  You may shower after you go home. Keep your incision dry for 48 hours. Do not soak in a bathtub, hot tub, or swim until the incision heals completely. You may not shower if you have a hemodialysis catheter.  Incision Care  Clean your incision with mild soap and water after 48 hours. Pat the area dry with a clean towel. You do not need a bandage unless otherwise instructed. Do not apply any ointments or creams to your incision. You may have skin glue on your incision. Do not peel it off. It will come off on its own in about one week. Your arm may swell a bit after surgery. To reduce swelling use pillows to elevate  your arm so it is above your heart. Your doctor will tell you if you need to lightly wrap your arm with an  ACE bandage.  Diet  Resume your normal diet. There are not special food restrictions following this procedure. In order to heal from your surgery, it is CRITICAL to get adequate nutrition. Your body requires vitamins, minerals, and protein. Vegetables are the best source of vitamins and minerals. Vegetables also provide the perfect balance of protein. Processed food has little nutritional value, so try to avoid this.  Medications  Resume taking all of your medications. If your incision is causing pain, you may take over-the counter pain relievers such as acetaminophen (Tylenol). If you were prescribed a stronger pain medication, please be aware these medications can cause nausea and constipation. Prevent nausea by taking the medication with a snack or meal. Avoid constipation by drinking plenty of fluids and eating foods with high amount of fiber, such as fruits, vegetables, and grains.  Do not take Tylenol if you are taking prescription pain medications.  Follow up Your surgeon may want to see you in the office following your access surgery. If so, this will be arranged at the time of your surgery.  Please call us immediately for any of the following conditions:  Increased pain, redness, drainage (pus) from your incision site Fever of 101 degrees or higher Severe or worsening pain at your incision site Hand pain or numbness.  Reduce your risk of vascular disease:  Stop smoking. If you would like help, call [facility] at 1-800-QUIT-NOW 815-376-2932) or [facility] [place] your cholesterol Maintain a desired weight Control your diabetes Keep your blood pressure down  Dialysis  It will take several weeks to several months for your new dialysis access to be ready for use. Your surgeon will determine when it is okay to use it. Your nephrologist will continue to  direct your dialysis. You can continue to use your Permcath until your new access is ready for use.   05/24/2017 SHAMIA UPPAL [id number] 03-Mar-1925  Surgeon(s): Fields, Jessy Oto, MD  Procedure(s): RIGHT ARTERIOVENOUS (AV) BRACHIOCEPHALIC FISTULA CREATION  x Do not stick fistula for 12 weeks    If you have any questions, please call the office at [phone].   Disposition: SNF  Patient's condition: is Good  Follow up: 1. Dr. Oneida Alar in 4-6 weeks-our office will call to arrange   Leontine Locket, PA-C [facility] [phone] 05/24/2017  3:05 PM

## 2017-05-24 NOTE — Clinical Social Work Note (Signed)
Ms. Kleinman is medically stable for discharge and will return to [facility]. CSW talked with Seth Bake, Resident Care Coordinator regarding patient's readiness for discharge and d/c clinicals transmitted to facility. Daughter, [name] contacted and informed of discharge. CSW advised by daughter that family will transport patient from hospital to facility. Daughter informed by nursing when patient was taken to dialysis and advised that she will be ready between 7 and 7:30 pm. CSW also informed Seth Bake at [place] of patient's afternoon dialysis. CSW signing off as patient discharging back to ALF today.  Linwood Gullikson Givens, MSW, LCSW Licensed Clinical Social Worker [place] [phone]

## 2017-05-24 NOTE — Progress Notes (Signed)
Pt not dialyzed this am due to overrun of patients from yesterday. She is next on the list.  Reporting SOB this afternoon, will get CXR and ^UF goal.  Has some rales on exam, nasal O2, not in distress.   Kelly Splinter MD [employer] pgr [phone]   05/24/2017, 1:00 PM

## 2017-05-24 NOTE — NC FL2 (Addendum)
[place] MEDICAID FL2 LEVEL OF CARE SCREENING TOOL     IDENTIFICATION  Patient Name: [name] Birthdate: [birth year] Sex: female Admission Date (Current Location): 05/23/2017  [place] and [place] Number:  [place] [id number] [place] and Address:  [facility] [address] [place], [place] [address]      Provider Number: [id number]  Attending Physician Name and Address:  Elam Dutch, MD  Relative Name and Phone Number:  [name] - daughter; [phone] (mobile), [phone] (home)     Current Level of Care: Hospital Recommended Level of Care: Assisted Living Facility([facility]) Prior Approval Number:    Date Approved/Denied:   PASRR Number:    Discharge Plan: Other (Comment)([facility])    Current Diagnoses: Patient Active Problem List   Diagnosis Date Noted  . Problem with vascular access 04/07/2017  . ESRD needing dialysis ([place]) 04/05/2017  . Hyperkalemia 04/05/2017  . Anemia in chronic kidney disease (CKD) 12/30/2016  . Pancreatic cyst 08/31/2016  . COPD with acute exacerbation ([place]) 08/11/2016  . Essential hypertension 08/11/2016  . Chronic pain 08/11/2016  . Esophageal dysmotility 06/01/2016  . Dysphagia   . Chronic diastolic heart failure ([place]) 10/07/2015  . Fluid overload 10/02/2015  . Hyperglycemia 10/02/2015  . Depression   . Pseudoaneurysm of arteriovenous dialysis fistula ([place]) 05/29/2015  . Traumatic hematoma of scalp 02/12/2014  . Decreased hearing of both ears 02/12/2014  . At high risk for falls 02/12/2014  . Leg swelling 09/18/2013  . OA (osteoarthritis) of knee 09/18/2013  . MDD (major depressive disorder) ([place]) 04/05/2013  . Mechanical complication of other vascular device, implant, and graft 11/14/2012  . Pain in limb-Left arm 07/11/2012  . Hypothyroidism 06/08/2012  . GERD (gastroesophageal reflux disease) 03/23/2012  . Gait instability 01/13/2012  .  Neuropathy of hand 01/13/2012  . Breast mass, right 07/11/2011  . Hemorrhoid 03/14/2011  . ESRD (end stage renal disease) on dialysis ([place]) 02/12/2011  . Dyspnea 01/17/2011  . Chronic respiratory failure ([place]) 01/13/2011  . Chronic constipation 12/09/2010  . Insomnia 10/14/2010  . Diabetes mellitus with renal complications ([place]) 73/57/8978  . ALLERGIC RHINITIS 04/03/2008  . DEPENDENT EDEMA, LEGS, BILATERAL 11/17/2006  . HYPERLIPIDEMIA 10/10/2006  . Anemia in chronic kidney disease 10/10/2006  . Anxiety 09/05/2006  . CATARACT NOS 09/05/2006  . IBS 09/05/2006  . ARTHRITIS 09/05/2006    Orientation RESPIRATION BLADDER Height & Weight     Self, Time, Situation, Place  Normal Continent Weight: 165 lb 0.1 oz (74.8 kg) Height:  5\' 6"  (167.6 cm)  BEHAVIORAL SYMPTOMS/MOOD NEUROLOGICAL BOWEL NUTRITION STATUS      Continent Diet(Renal Diet)  AMBULATORY STATUS COMMUNICATION OF NEEDS Skin   Independent(Uses a walker to assist with ambulation) Verbally Surgical wounds(05/23/17 - Incision from AV Fistual placement)                       Personal Care Assistance Level of Assistance  Bathing, Feeding, Dressing Bathing Assistance: Limited assistance Feeding assistance: Independent Dressing Assistance: Independent     Functional Limitations Info  Sight, Hearing, Speech Sight Info: Adequate Hearing Info: Adequate Speech Info: Adequate    SPECIAL CARE FACTORS FREQUENCY                       Contractures      Additional Factors Info  Code Status, Allergies Code Status Info: Full Allergies Info: No known allergies  Current Medications (05/24/2017):  This is the current hospital active medication list Current Facility-Administered Medications  Medication Dose Route Frequency Provider Last Rate Last Dose  . acetaminophen (TYLENOL) tablet 325-650 mg  325-650 mg Oral Q4H PRN Dagoberto Ligas, PA-C       Or  . acetaminophen (TYLENOL) suppository 325-650 mg   325-650 mg Rectal Q4H PRN Dagoberto Ligas, PA-C      . albuterol (PROVENTIL) (2.5 MG/3ML) 0.083% nebulizer solution 2.5 mg  2.5 mg Nebulization Q4H PRN Dagoberto Ligas, PA-C   2.5 mg at 05/23/17 1524  . aspirin EC tablet 81 mg  81 mg Oral Daily Dagoberto Ligas, PA-C      . cinacalcet (SENSIPAR) tablet 30 mg  30 mg Oral Q breakfast Dagoberto Ligas, PA-C   30 mg at 05/24/17 0737  . citalopram (CELEXA) tablet 20 mg  20 mg Oral Daily Dagoberto Ligas, PA-C      . diltiazem (CARDIZEM CD) 24 hr capsule 180 mg  180 mg Oral Daily Eveland, Matthew, PA-C      . docusate sodium (COLACE) capsule 100 mg  100 mg Oral BID Dagoberto Ligas, PA-C   100 mg at 05/23/17 2242  . guaiFENesin-dextromethorphan (ROBITUSSIN DM) 100-10 MG/5ML syrup 15 mL  15 mL Oral Q4H PRN Dagoberto Ligas, PA-C      . isosorbide mononitrate (IMDUR) 24 hr tablet 60 mg  60 mg Oral Daily Eveland, Matthew, PA-C      . labetalol (NORMODYNE,TRANDATE) injection 10 mg  10 mg Intravenous Q10 min PRN Dagoberto Ligas, PA-C   10 mg at 05/24/17 0740  . levothyroxine (SYNTHROID, LEVOTHROID) tablet 25 mcg  25 mcg Oral QAC breakfast Dagoberto Ligas, PA-C   25 mcg at 05/24/17 0737  . metoprolol tartrate (LOPRESSOR) injection 2-5 mg  2-5 mg Intravenous Q2H PRN Dagoberto Ligas, PA-C      . mirtazapine (REMERON) tablet 7.5 mg  7.5 mg Oral QHS Dagoberto Ligas, PA-C   7.5 mg at 05/23/17 2242  . morphine 2 MG/ML injection 2 mg  2 mg Intravenous Q1H PRN Dagoberto Ligas, PA-C      . ondansetron (ZOFRAN) injection 4 mg  4 mg Intravenous Q6H PRN Dagoberto Ligas, PA-C      . oxyCODONE-acetaminophen (PERCOCET/ROXICET) 5-325 MG per tablet 1-2 tablet  1-2 tablet Oral Q4H PRN Dagoberto Ligas, PA-C   1 tablet at 05/24/17 3244  . pantoprazole (PROTONIX) EC tablet 20 mg  20 mg Oral Daily Eveland, Matthew, PA-C      . phenol (CHLORASEPTIC) mouth spray 1 spray  1 spray Mouth/Throat PRN Dagoberto Ligas, PA-C         Discharge Medications: Please see discharge  summary for a list of discharge medications.  Relevant Imaging Results:  Relevant Lab Results:   Additional Information 05/23/16 - AV Fistual Placement.  Dialysis T[facility] at [facility].    TAKE these medications   albuterol (2.5 MG/3ML) 0.083% nebulizer solution Commonly known as:  PROVENTIL USE 1 VIAL IN NEBULIZER 3 TIMES DAILY AS NEEDED FOR WHEEZING/SHORTNESS OF BREATH.   VENTOLIN HFA 108 (90 Base) MCG/ACT inhaler Generic drug:  albuterol INHALE (2) PUFFS EVERY 4 HOURS AS NEEDED FOR WHEEZING OR SHORTNESS OFBREATH(COUGH).   albuterol (2.5 MG/3ML) 0.083% nebulizer solution Commonly known as:  PROVENTIL INHALE 1 VIAL VIA NEBULIER 3 TIMES A DAY FOR 7 DAYS FOR SHORTNESS OF BREATH AND THEN D/C   ARTIFICIAL TEARS 1.4 % ophthalmic solution Generic drug:  polyvinyl alcohol INSTILL 1 DROP INTO EITHER EYE UP TO 6 TIMES DAILY  AS NEEDED FOR LUBRICATION   aspirin EC 81 MG tablet TAKE (1) TABLET BY MOUTH ONCE DAILY.   citalopram 20 MG tablet Commonly known as:  CELEXA TAKE (1) TABLET BY MOUTH ONCE IN THE MORNING.   diltiazem 180 MG 24 hr capsule Commonly known as:  CARDIZEM CD TAKE 1 CAPSULE BY MOUTH ONCE A DAY.   docusate sodium 100 MG capsule Commonly known as:  COLACE TAKE 1 CAPSULE BY MOUTH TWICE DAILY.   ipratropium 0.02 % nebulizer solution Commonly known as:  ATROVENT USE 1 VIAL IN NEBULIZER 3 TIMES DAILY AS NEEDED FOR WHEEZING/SHORTNESS OF BREATH.   isosorbide mononitrate 60 MG 24 hr tablet Commonly known as:  IMDUR TAKE (1) TABLET BY MOUTH ONCE DAILY.   levothyroxine 25 MCG tablet Commonly known as:  SYNTHROID, LEVOTHROID TAKE (1) TABLET BY MOUTH ONCE DAILY BEFORE BREAKFAST.   lidocaine-prilocaine cream Commonly known as:  EMLA APPLY TOPICALLY TO LEFT UPPER ARM 1 HOUR PRIOR TO DIALYSIS ON MONDAY,WEDNESDAY & FRIDAYS AS NEEDED FOR PAIN. WRAP WITH PLASTIC WRAP AFTER A   MIRALAX powder Generic drug:  polyethylene glycol powder Take 17 g by mouth daily.    mirtazapine 15 MG tablet Commonly known as:  REMERON TAKE 1/2 TABLET (7.5MG ) BY MOUTH AT BEDTIME FOR MOOD/APPETITE.   nitroGLYCERIN 0.4 MG SL tablet Commonly known as:  NITROSTAT DISSOLVE 1 TABLET UNDER THE TONGUE EVERY 5 MINUTES X2 DOSES THEN TO ER   nystatin powder Commonly known as:  MYCOSTATIN/NYSTOP Apply 4 (four) times daily as needed topically. To groin/ abd folds for irritation   OXYGEN Inhale 2 L into the lungs continuous.   pantoprazole 40 MG tablet Commonly known as:  PROTONIX TAKE (1) TABLET BY MOUTH ONCE DAILY.   SENSIPAR 30 MG tablet Generic drug:  cinacalcet TAKE 1 TABLET BY MOUTH ONCE DAILY AFTER THE EVENING MEAL *PROVIDED BYDAVITA DIALYSIS*   sevelamer carbonate 800 MG tablet Commonly known as:  RENVELA Take 800-2,400 mg by mouth See admin instructions. Take 2400 mg by mouth three times daily with meals and take 800 mg by mouth once daily in the evening with snack.   TAB-A-VITE PO Take 1 tablet by mouth daily.   traMADol 50 MG tablet Commonly known as:  ULTRAM Take 1 tablet (50 mg total) by mouth every 12 (twelve) hours as needed for moderate pain.   TUSSIN DM 10-100 MG/5ML liquid Generic drug:  Dextromethorphan-guaiFENesin TAKE ONE TEASPOONFUL BY MOUTH EVERY 4 HOURS AS NEEDED FOR COUGH         Natash Berman, Mila Homer, LCSW

## 2017-05-24 NOTE — Care Management Obs Status (Signed)
[place] NOTIFICATION   Patient Details  Name: [name] MRN: [id number] Date of Birth: [birth year]   Medicare Observation Status Notification Given:       Kristen Cardinal, RN 05/24/2017, 12:45 PM

## 2017-05-24 NOTE — Telephone Encounter (Signed)
Sch appt 06/28/17 1pm Dialysis Duplex 2pm P/O PA spk to pt daughter about appt

## 2017-05-24 NOTE — Discharge Instructions (Signed)
° °  [facility]  Discharge Instructions  AV Fistula or Graft Surgery for Dialysis Access  Please refer to the following instructions for your post-procedure care. Your surgeon or physician assistant will discuss any changes with you.  Activity  You may drive the day following your surgery, if you are comfortable and no longer taking prescription pain medication. Resume full activity as the soreness in your incision resolves.  Bathing/Showering  You may shower after you go home. Keep your incision dry for 48 hours. Do not soak in a bathtub, hot tub, or swim until the incision heals completely. You may not shower if you have a hemodialysis catheter.  Incision Care  Clean your incision with mild soap and water after 48 hours. Pat the area dry with a clean towel. You do not need a bandage unless otherwise instructed. Do not apply any ointments or creams to your incision. You may have skin glue on your incision. Do not peel it off. It will come off on its own in about one week. Your arm may swell a bit after surgery. To reduce swelling use pillows to elevate your arm so it is above your heart. Your doctor will tell you if you need to lightly wrap your arm with an ACE bandage.  Diet  Resume your normal diet. There are not special food restrictions following this procedure. In order to heal from your surgery, it is CRITICAL to get adequate nutrition. Your body requires vitamins, minerals, and protein. Vegetables are the best source of vitamins and minerals. Vegetables also provide the perfect balance of protein. Processed food has little nutritional value, so try to avoid this.  Medications  Resume taking all of your medications. If your incision is causing pain, you may take over-the counter pain relievers such as acetaminophen (Tylenol). If you were prescribed a stronger pain medication, please be aware these medications can cause nausea and constipation. Prevent  nausea by taking the medication with a snack or meal. Avoid constipation by drinking plenty of fluids and eating foods with high amount of fiber, such as fruits, vegetables, and grains.  Do not take Tylenol if you are taking prescription pain medications.  Follow up Your surgeon may want to see you in the office following your access surgery. If so, this will be arranged at the time of your surgery.  Please call us immediately for any of the following conditions:  Increased pain, redness, drainage (pus) from your incision site Fever of 101 degrees or higher Severe or worsening pain at your incision site Hand pain or numbness.  Reduce your risk of vascular disease:  Stop smoking. If you would like help, call [facility] at 1-800-QUIT-NOW 437-120-5378) or [place] at [place] your cholesterol Maintain a desired weight Control your diabetes Keep your blood pressure down  Dialysis  It will take several weeks to several months for your new dialysis access to be ready for use. Your surgeon will determine when it is okay to use it. Your nephrologist will continue to direct your dialysis. You can continue to use your Permcath until your new access is ready for use.   05/24/2017 [name] [id number] 1925/11/11  Surgeon(s): Fields, Jessy Oto, MD  Procedure(s): RIGHT ARTERIOVENOUS (AV) BRACHIOCEPHALIC FISTULA CREATION  x Do not stick fistula for 12 weeks    If you have any questions, please call the office at [phone].

## 2017-05-25 ENCOUNTER — Telehealth: Payer: Self-pay | Admitting: *Deleted

## 2017-05-25 ENCOUNTER — Other Ambulatory Visit: Payer: Self-pay | Admitting: *Deleted

## 2017-05-25 ENCOUNTER — Other Ambulatory Visit: Payer: Self-pay

## 2017-05-25 DIAGNOSIS — Z7982 Long term (current) use of aspirin: Secondary | ICD-10-CM | POA: Diagnosis not present

## 2017-05-25 DIAGNOSIS — R531 Weakness: Secondary | ICD-10-CM | POA: Diagnosis not present

## 2017-05-25 DIAGNOSIS — N186 End stage renal disease: Secondary | ICD-10-CM | POA: Diagnosis not present

## 2017-05-25 DIAGNOSIS — R0789 Other chest pain: Secondary | ICD-10-CM | POA: Diagnosis not present

## 2017-05-25 DIAGNOSIS — Z992 Dependence on renal dialysis: Principal | ICD-10-CM

## 2017-05-25 DIAGNOSIS — E1122 Type 2 diabetes mellitus with diabetic chronic kidney disease: Secondary | ICD-10-CM | POA: Diagnosis not present

## 2017-05-25 DIAGNOSIS — R1084 Generalized abdominal pain: Secondary | ICD-10-CM | POA: Diagnosis not present

## 2017-05-25 DIAGNOSIS — R112 Nausea with vomiting, unspecified: Secondary | ICD-10-CM | POA: Diagnosis not present

## 2017-05-25 DIAGNOSIS — R404 Transient alteration of awareness: Secondary | ICD-10-CM | POA: Diagnosis not present

## 2017-05-25 DIAGNOSIS — R1111 Vomiting without nausea: Secondary | ICD-10-CM | POA: Diagnosis not present

## 2017-05-25 DIAGNOSIS — R079 Chest pain, unspecified: Secondary | ICD-10-CM | POA: Diagnosis not present

## 2017-05-25 DIAGNOSIS — I120 Hypertensive chronic kidney disease with stage 5 chronic kidney disease or end stage renal disease: Secondary | ICD-10-CM | POA: Diagnosis not present

## 2017-05-25 DIAGNOSIS — J9811 Atelectasis: Secondary | ICD-10-CM | POA: Diagnosis not present

## 2017-05-25 DIAGNOSIS — Z79899 Other long term (current) drug therapy: Secondary | ICD-10-CM | POA: Diagnosis not present

## 2017-05-25 LAB — RENAL FUNCTION PANEL
ALBUMIN: 3.2 g/dL — AB (ref 3.5–5.0)
Anion gap: 12 (ref 5–15)
BUN: 96 mg/dL — ABNORMAL HIGH (ref 6–20)
CHLORIDE: 99 mmol/L — AB (ref 101–111)
CO2: 26 mmol/L (ref 22–32)
CREATININE: 11.02 mg/dL — AB (ref 0.44–1.00)
Calcium: 8.5 mg/dL — ABNORMAL LOW (ref 8.9–10.3)
GFR, EST AFRICAN AMERICAN: 3 mL/min — AB (ref >60)
GFR, EST NON AFRICAN AMERICAN: 3 mL/min — AB (ref >60)
Glucose, Bld: 145 mg/dL — ABNORMAL HIGH (ref 65–99)
Phosphorus: 5.5 mg/dL — ABNORMAL HIGH (ref 2.5–4.6)
Potassium: 6.4 mmol/L (ref 3.5–5.1)
Sodium: 137 mmol/L (ref 135–145)

## 2017-05-25 LAB — GLUCOSE, CAPILLARY: GLUCOSE-CAPILLARY: 112 mg/dL — AB (ref 65–99)

## 2017-05-25 MED ORDER — PROMETHAZINE HCL 12.5 MG RE SUPP: 12.5000 mg | Freq: Four times a day (QID) | RECTAL | 0 refills | Status: DC | PRN

## 2017-05-25 NOTE — Telephone Encounter (Signed)
Received call from [place], [employer] with [place]. Reports that patient has returned to facility from hospital where dialysis access site was changed. States that patient has been voicing C/O N/V since her return and has not been able to keep any food down.   Call placed to facility. Advised to send patient back to hospital for evaluation. MD made aware.

## 2017-05-25 NOTE — Telephone Encounter (Signed)
Agree with above, pt not tolerating PO  S/p Graft surgery for HD access With age comorbidites back to ER

## 2017-05-25 NOTE — Telephone Encounter (Signed)
Patient called to report that she has had N&V since surgery on Monday 05-23-17 ( right brachiocephalic AVF by Dr. Oneida Alar). She has not been able to hold down food or water during this time. I have called [facility] and spoken to [place], who confirms that the patient has been unable to keep anything down. I called in Phenergan Suppositories (Ok'd by Dr. Scot Dock) to [facility] so that they can deliver these to [facility]. [place] is to monitor the patient closely d/t possible norovirus S&S. I also contacted Mrs Merrow's daughter, [name], and she is in agreement with this medication being called in for her mother.

## 2017-05-26 ENCOUNTER — Encounter: Payer: Self-pay | Admitting: Vascular Surgery

## 2017-05-26 ENCOUNTER — Ambulatory Visit: Payer: Medicare HMO | Admitting: Vascular Surgery

## 2017-05-26 ENCOUNTER — Other Ambulatory Visit (HOSPITAL_COMMUNITY): Payer: Medicare HMO

## 2017-05-26 ENCOUNTER — Other Ambulatory Visit: Payer: Self-pay

## 2017-05-26 ENCOUNTER — Ambulatory Visit (INDEPENDENT_AMBULATORY_CARE_PROVIDER_SITE_OTHER): Payer: Medicare HMO | Admitting: Vascular Surgery

## 2017-05-26 ENCOUNTER — Encounter (HOSPITAL_COMMUNITY): Payer: Medicare HMO

## 2017-05-26 ENCOUNTER — Encounter: Payer: Medicare HMO | Admitting: Vascular Surgery

## 2017-05-26 VITALS — BP 170/89 | HR 94 | Temp 97.9°F | Resp 14 | Ht 66.0 in | Wt 164.0 lb

## 2017-05-26 DIAGNOSIS — Z992 Dependence on renal dialysis: Secondary | ICD-10-CM

## 2017-05-26 DIAGNOSIS — R279 Unspecified lack of coordination: Secondary | ICD-10-CM | POA: Diagnosis not present

## 2017-05-26 DIAGNOSIS — N186 End stage renal disease: Secondary | ICD-10-CM

## 2017-05-26 DIAGNOSIS — F0390 Unspecified dementia without behavioral disturbance: Secondary | ICD-10-CM | POA: Diagnosis not present

## 2017-05-26 DIAGNOSIS — Z7401 Bed confinement status: Secondary | ICD-10-CM | POA: Diagnosis not present

## 2017-05-26 NOTE — Progress Notes (Signed)
Patient is an 84 year old female who returns for follow-up today.  She recently had a right brachiocephalic AV fistula placed.  She denies any numbness or tingling in her hand.  She apparently was in the emergency room with nausea last night.  She feels better now.  She currently is dialyzing via right sided catheter.  She currently resides in a skilled nursing facility.  Physical exam:  Vitals:   05/26/17 0909 05/26/17 0912  BP: (!) 193/91 (!) 170/89  Pulse: 94 94  Resp: 14   Temp: 97.9 F (36.6 C)   TempSrc: Oral   SpO2: (!) 80%   Weight: 164 lb (74.4 kg)   Height: 5\' 6"  (1.676 m)     Extremities: 2+ right radial pulse palpable thrill audible bruit right upper arm AV fistula well-healed antecubital incision.  Assessment: Maturing AV fistula right arm.  Plan: The patient has follow-up scheduled in a few weeks for reassessment of her fistula to see if it is mature.  Ruta Hinds, MD [facility] [place] Office: [phone] Pager: [phone]

## 2017-05-26 NOTE — Progress Notes (Signed)
Vitals:   05/26/17 0909  BP: (!) 193/91  Pulse: 94  Resp: 14  Temp: 97.9 F (36.6 C)  TempSrc: Oral  SpO2: (!) 80%  Weight: 164 lb (74.4 kg)  Height: 5\' 6"  (1.676 m)

## 2017-05-27 DIAGNOSIS — Z992 Dependence on renal dialysis: Secondary | ICD-10-CM | POA: Diagnosis not present

## 2017-05-27 DIAGNOSIS — N186 End stage renal disease: Secondary | ICD-10-CM | POA: Diagnosis not present

## 2017-05-27 DIAGNOSIS — F0390 Unspecified dementia without behavioral disturbance: Secondary | ICD-10-CM | POA: Diagnosis not present

## 2017-05-28 DIAGNOSIS — F0390 Unspecified dementia without behavioral disturbance: Secondary | ICD-10-CM | POA: Diagnosis not present

## 2017-05-29 DIAGNOSIS — F0390 Unspecified dementia without behavioral disturbance: Secondary | ICD-10-CM | POA: Diagnosis not present

## 2017-05-30 DIAGNOSIS — F0390 Unspecified dementia without behavioral disturbance: Secondary | ICD-10-CM | POA: Diagnosis not present

## 2017-05-30 DIAGNOSIS — Z992 Dependence on renal dialysis: Secondary | ICD-10-CM | POA: Diagnosis not present

## 2017-05-30 DIAGNOSIS — N186 End stage renal disease: Secondary | ICD-10-CM | POA: Diagnosis not present

## 2017-05-31 DIAGNOSIS — F0390 Unspecified dementia without behavioral disturbance: Secondary | ICD-10-CM | POA: Diagnosis not present

## 2017-06-01 DIAGNOSIS — N186 End stage renal disease: Secondary | ICD-10-CM | POA: Diagnosis not present

## 2017-06-01 DIAGNOSIS — F0390 Unspecified dementia without behavioral disturbance: Secondary | ICD-10-CM | POA: Diagnosis not present

## 2017-06-01 DIAGNOSIS — Z992 Dependence on renal dialysis: Secondary | ICD-10-CM | POA: Diagnosis not present

## 2017-06-02 DIAGNOSIS — F0390 Unspecified dementia without behavioral disturbance: Secondary | ICD-10-CM | POA: Diagnosis not present

## 2017-06-03 DIAGNOSIS — N186 End stage renal disease: Secondary | ICD-10-CM | POA: Diagnosis not present

## 2017-06-03 DIAGNOSIS — Z992 Dependence on renal dialysis: Secondary | ICD-10-CM | POA: Diagnosis not present

## 2017-06-03 DIAGNOSIS — F0390 Unspecified dementia without behavioral disturbance: Secondary | ICD-10-CM | POA: Diagnosis not present

## 2017-06-04 DIAGNOSIS — F0390 Unspecified dementia without behavioral disturbance: Secondary | ICD-10-CM | POA: Diagnosis not present

## 2017-06-05 DIAGNOSIS — F0390 Unspecified dementia without behavioral disturbance: Secondary | ICD-10-CM | POA: Diagnosis not present

## 2017-06-06 DIAGNOSIS — Z992 Dependence on renal dialysis: Secondary | ICD-10-CM | POA: Diagnosis not present

## 2017-06-06 DIAGNOSIS — F0390 Unspecified dementia without behavioral disturbance: Secondary | ICD-10-CM | POA: Diagnosis not present

## 2017-06-06 DIAGNOSIS — N186 End stage renal disease: Secondary | ICD-10-CM | POA: Diagnosis not present

## 2017-06-07 DIAGNOSIS — F0390 Unspecified dementia without behavioral disturbance: Secondary | ICD-10-CM | POA: Diagnosis not present

## 2017-06-08 DIAGNOSIS — L84 Corns and callosities: Secondary | ICD-10-CM | POA: Diagnosis not present

## 2017-06-08 DIAGNOSIS — N186 End stage renal disease: Secondary | ICD-10-CM | POA: Diagnosis not present

## 2017-06-08 DIAGNOSIS — E1051 Type 1 diabetes mellitus with diabetic peripheral angiopathy without gangrene: Secondary | ICD-10-CM | POA: Diagnosis not present

## 2017-06-08 DIAGNOSIS — Z992 Dependence on renal dialysis: Secondary | ICD-10-CM | POA: Diagnosis not present

## 2017-06-08 DIAGNOSIS — F0390 Unspecified dementia without behavioral disturbance: Secondary | ICD-10-CM | POA: Diagnosis not present

## 2017-06-09 DIAGNOSIS — H35341 Macular cyst, hole, or pseudohole, right eye: Secondary | ICD-10-CM | POA: Diagnosis not present

## 2017-06-09 DIAGNOSIS — E119 Type 2 diabetes mellitus without complications: Secondary | ICD-10-CM | POA: Diagnosis not present

## 2017-06-09 DIAGNOSIS — Z0100 Encounter for examination of eyes and vision without abnormal findings: Secondary | ICD-10-CM | POA: Diagnosis not present

## 2017-06-09 DIAGNOSIS — H401131 Primary open-angle glaucoma, bilateral, mild stage: Secondary | ICD-10-CM | POA: Diagnosis not present

## 2017-06-09 DIAGNOSIS — F0390 Unspecified dementia without behavioral disturbance: Secondary | ICD-10-CM | POA: Diagnosis not present

## 2017-06-09 DIAGNOSIS — H3581 Retinal edema: Secondary | ICD-10-CM | POA: Diagnosis not present

## 2017-06-10 DIAGNOSIS — F0390 Unspecified dementia without behavioral disturbance: Secondary | ICD-10-CM | POA: Diagnosis not present

## 2017-06-10 DIAGNOSIS — Z992 Dependence on renal dialysis: Secondary | ICD-10-CM | POA: Diagnosis not present

## 2017-06-10 DIAGNOSIS — N186 End stage renal disease: Secondary | ICD-10-CM | POA: Diagnosis not present

## 2017-06-11 DIAGNOSIS — F0390 Unspecified dementia without behavioral disturbance: Secondary | ICD-10-CM | POA: Diagnosis not present

## 2017-06-12 DIAGNOSIS — F0390 Unspecified dementia without behavioral disturbance: Secondary | ICD-10-CM | POA: Diagnosis not present

## 2017-06-13 DIAGNOSIS — N186 End stage renal disease: Secondary | ICD-10-CM | POA: Diagnosis not present

## 2017-06-13 DIAGNOSIS — Z992 Dependence on renal dialysis: Secondary | ICD-10-CM | POA: Diagnosis not present

## 2017-06-13 DIAGNOSIS — F0390 Unspecified dementia without behavioral disturbance: Secondary | ICD-10-CM | POA: Diagnosis not present

## 2017-06-14 ENCOUNTER — Ambulatory Visit (INDEPENDENT_AMBULATORY_CARE_PROVIDER_SITE_OTHER): Payer: Medicare HMO | Admitting: Family Medicine

## 2017-06-14 ENCOUNTER — Encounter: Payer: Self-pay | Admitting: Family Medicine

## 2017-06-14 ENCOUNTER — Other Ambulatory Visit: Payer: Self-pay

## 2017-06-14 VITALS — BP 148/98 | HR 80 | Temp 98.4°F | Resp 16 | Ht 66.0 in | Wt 167.0 lb

## 2017-06-14 DIAGNOSIS — R269 Unspecified abnormalities of gait and mobility: Secondary | ICD-10-CM | POA: Diagnosis not present

## 2017-06-14 DIAGNOSIS — E0821 Diabetes mellitus due to underlying condition with diabetic nephropathy: Secondary | ICD-10-CM | POA: Diagnosis not present

## 2017-06-14 DIAGNOSIS — J449 Chronic obstructive pulmonary disease, unspecified: Secondary | ICD-10-CM | POA: Diagnosis not present

## 2017-06-14 DIAGNOSIS — N186 End stage renal disease: Secondary | ICD-10-CM

## 2017-06-14 DIAGNOSIS — I10 Essential (primary) hypertension: Secondary | ICD-10-CM

## 2017-06-14 DIAGNOSIS — R296 Repeated falls: Secondary | ICD-10-CM | POA: Diagnosis not present

## 2017-06-14 DIAGNOSIS — J9610 Chronic respiratory failure, unspecified whether with hypoxia or hypercapnia: Secondary | ICD-10-CM | POA: Diagnosis not present

## 2017-06-14 DIAGNOSIS — E038 Other specified hypothyroidism: Secondary | ICD-10-CM | POA: Diagnosis not present

## 2017-06-14 DIAGNOSIS — R0602 Shortness of breath: Secondary | ICD-10-CM | POA: Diagnosis not present

## 2017-06-14 DIAGNOSIS — I509 Heart failure, unspecified: Secondary | ICD-10-CM | POA: Diagnosis not present

## 2017-06-14 DIAGNOSIS — F0390 Unspecified dementia without behavioral disturbance: Secondary | ICD-10-CM | POA: Diagnosis not present

## 2017-06-14 DIAGNOSIS — Z992 Dependence on renal dialysis: Secondary | ICD-10-CM

## 2017-06-14 DIAGNOSIS — I5032 Chronic diastolic (congestive) heart failure: Secondary | ICD-10-CM | POA: Diagnosis not present

## 2017-06-14 DIAGNOSIS — R2681 Unsteadiness on feet: Secondary | ICD-10-CM | POA: Diagnosis not present

## 2017-06-14 NOTE — Patient Instructions (Signed)
F/U 6 MONTHS PHYSICAL

## 2017-06-14 NOTE — Assessment & Plan Note (Signed)
BP difficult to control at times, no change to meds

## 2017-06-14 NOTE — Assessment & Plan Note (Signed)
Diet controlled, unable to obtain labs, will have done at  dialysis

## 2017-06-14 NOTE — Assessment & Plan Note (Signed)
Per renal, possible she received wrong meds, or anestheisa affect? Now back at [facility]

## 2017-06-14 NOTE — Progress Notes (Signed)
   Subjective:    Patient ID: [name], female    DOB: 01/07/1926, 84 y.o.   MRN: [id number]  Patient presents for Follow-up (is not fasting)   Oxyen dependent- is up to 3l her daughter often she takes her oxygen off when she is at the facility and will then complain of shortness of breath.  ESRD- on HD, had recent vascular access changed, getting HD Using Nebs as needed  Does describe feeling out of her mind had upset stomach nausea after the procedure.  She thinks that she was given the wrong medications by a new staff member at the facility.  She states this because a week or so before they had brought her the wrong pills and she refused to take   Hypothyroidism- due for repeat TFT  Meds reviewed  HTN- BP been up and down.  She is still on the Cardizem  Review Of Systems:  GEN- denies fatigue, fever, weight loss,weakness, recent illness HEENT- denies eye drainage, change in vision, nasal discharge, CVS- denies chest pain, palpitations RESP- denies SOB, cough, wheeze ABD- denies N/V, change in stools, abd pain GU- denies dysuria, hematuria, dribbling, incontinence MSK- denies joint pain, muscle aches, injury Neuro- denies headache, dizziness, syncope, seizure activity       Objective:    BP (!) 148/98   Pulse 80   Temp 98.4 F (36.9 C) (Oral)   Resp 16   Ht 5\' 6"  (1.676 m)   Wt 167 lb (75.8 kg)   LMP 12/24/2010   SpO2 94% Comment: 3L/min via [place]  BMI 26.95 kg/m  GEN- NAD, alert and oriented x3 HEENT-wearing sunglasses  pink conjunctiva, MMM, oropharynx clear Neck- Supple, no LAD, CVS- RRR, 3/6 systolic murmur, has perm cath  RESP-CTAB ABD-NABS,soft,NT,ND EXT- No edema Pulses- Radial 2+       Assessment & Plan:      Problem List Items Addressed This Visit      Unprioritized   Hypothyroidism   Relevant Orders   T3, free   TSH   Essential hypertension    BP difficult to control at times, no change to meds       ESRD (end stage renal disease)  on dialysis ([place]) - Primary    Per renal, possible she received wrong meds, or anestheisa affect? Now back at [facility]       Diabetes mellitus with renal complications ([place])    Diet controlled, unable to obtain labs, will have done at  dialysis      Relevant Orders   Hemoglobin A1c      Note: This dictation was prepared with Dragon dictation along with smaller phrase technology. Any transcriptional errors that result from this process are unintentional.

## 2017-06-15 DIAGNOSIS — N186 End stage renal disease: Secondary | ICD-10-CM | POA: Diagnosis not present

## 2017-06-15 DIAGNOSIS — Z992 Dependence on renal dialysis: Secondary | ICD-10-CM | POA: Diagnosis not present

## 2017-06-15 DIAGNOSIS — F0390 Unspecified dementia without behavioral disturbance: Secondary | ICD-10-CM | POA: Diagnosis not present

## 2017-06-15 LAB — HEMOGLOBIN A1C
Hgb A1c MFr Bld: 5.6 % of total Hgb (ref <5.7)
MEAN PLASMA GLUCOSE: 114 (calc)
eAG (mmol/L): 6.3 (calc)

## 2017-06-15 LAB — T3, FREE: T3, Free: 2.3 pg/mL (ref 2.3–4.2)

## 2017-06-15 LAB — TSH: TSH: 1.52 m[IU]/L (ref 0.40–4.50)

## 2017-06-16 ENCOUNTER — Encounter: Payer: Self-pay | Admitting: *Deleted

## 2017-06-16 DIAGNOSIS — F0390 Unspecified dementia without behavioral disturbance: Secondary | ICD-10-CM | POA: Diagnosis not present

## 2017-06-17 DIAGNOSIS — F0390 Unspecified dementia without behavioral disturbance: Secondary | ICD-10-CM | POA: Diagnosis not present

## 2017-06-17 DIAGNOSIS — Z992 Dependence on renal dialysis: Secondary | ICD-10-CM | POA: Diagnosis not present

## 2017-06-17 DIAGNOSIS — N186 End stage renal disease: Secondary | ICD-10-CM | POA: Diagnosis not present

## 2017-06-18 DIAGNOSIS — F0390 Unspecified dementia without behavioral disturbance: Secondary | ICD-10-CM | POA: Diagnosis not present

## 2017-06-19 DIAGNOSIS — F0390 Unspecified dementia without behavioral disturbance: Secondary | ICD-10-CM | POA: Diagnosis not present

## 2017-06-20 DIAGNOSIS — F0390 Unspecified dementia without behavioral disturbance: Secondary | ICD-10-CM | POA: Diagnosis not present

## 2017-06-20 DIAGNOSIS — N186 End stage renal disease: Secondary | ICD-10-CM | POA: Diagnosis not present

## 2017-06-20 DIAGNOSIS — Z992 Dependence on renal dialysis: Secondary | ICD-10-CM | POA: Diagnosis not present

## 2017-06-21 DIAGNOSIS — F0390 Unspecified dementia without behavioral disturbance: Secondary | ICD-10-CM | POA: Diagnosis not present

## 2017-06-22 ENCOUNTER — Other Ambulatory Visit: Payer: Self-pay | Admitting: Family Medicine

## 2017-06-22 DIAGNOSIS — F0390 Unspecified dementia without behavioral disturbance: Secondary | ICD-10-CM | POA: Diagnosis not present

## 2017-06-22 DIAGNOSIS — N186 End stage renal disease: Secondary | ICD-10-CM | POA: Diagnosis not present

## 2017-06-22 DIAGNOSIS — Z992 Dependence on renal dialysis: Secondary | ICD-10-CM | POA: Diagnosis not present

## 2017-06-22 NOTE — Telephone Encounter (Signed)
Ok to refill??  Last office visit 06/14/2017.  Last refill 05/24/2017.

## 2017-06-23 ENCOUNTER — Other Ambulatory Visit: Payer: Self-pay | Admitting: Family Medicine

## 2017-06-23 DIAGNOSIS — F0390 Unspecified dementia without behavioral disturbance: Secondary | ICD-10-CM | POA: Diagnosis not present

## 2017-06-24 DIAGNOSIS — Z992 Dependence on renal dialysis: Secondary | ICD-10-CM | POA: Diagnosis not present

## 2017-06-24 DIAGNOSIS — N186 End stage renal disease: Secondary | ICD-10-CM | POA: Diagnosis not present

## 2017-06-24 DIAGNOSIS — F0390 Unspecified dementia without behavioral disturbance: Secondary | ICD-10-CM | POA: Diagnosis not present

## 2017-06-25 DIAGNOSIS — F0390 Unspecified dementia without behavioral disturbance: Secondary | ICD-10-CM | POA: Diagnosis not present

## 2017-06-26 DIAGNOSIS — F0390 Unspecified dementia without behavioral disturbance: Secondary | ICD-10-CM | POA: Diagnosis not present

## 2017-06-27 DIAGNOSIS — Z992 Dependence on renal dialysis: Secondary | ICD-10-CM | POA: Diagnosis not present

## 2017-06-27 DIAGNOSIS — N186 End stage renal disease: Secondary | ICD-10-CM | POA: Diagnosis not present

## 2017-06-27 DIAGNOSIS — F0390 Unspecified dementia without behavioral disturbance: Secondary | ICD-10-CM | POA: Diagnosis not present

## 2017-06-28 ENCOUNTER — Ambulatory Visit (HOSPITAL_COMMUNITY)
Admission: RE | Admit: 2017-06-28 | Discharge: 2017-06-28 | Disposition: A | Discharge status: Home / Self Care | Payer: Medicare HMO | Source: Ambulatory Visit | Attending: Vascular Surgery | Admitting: Vascular Surgery

## 2017-06-28 ENCOUNTER — Ambulatory Visit (INDEPENDENT_AMBULATORY_CARE_PROVIDER_SITE_OTHER): Payer: Self-pay | Admitting: Physician Assistant

## 2017-06-28 VITALS — BP 192/95 | HR 83 | Temp 98.3°F | Resp 16 | Ht 66.0 in | Wt 165.0 lb

## 2017-06-28 DIAGNOSIS — N186 End stage renal disease: Secondary | ICD-10-CM | POA: Diagnosis not present

## 2017-06-28 DIAGNOSIS — Z992 Dependence on renal dialysis: Secondary | ICD-10-CM | POA: Insufficient documentation

## 2017-06-28 DIAGNOSIS — F0390 Unspecified dementia without behavioral disturbance: Secondary | ICD-10-CM | POA: Diagnosis not present

## 2017-06-28 NOTE — Progress Notes (Signed)
POST OPERATIVE OFFICE NOTE    CC:  F/u for surgery  HPI:  This is a 84 y.o. female who is s/p right brachial cephalic AV fistula by Dr. Oneida Alar on 05/23/17.  She is doing well and denies any hand pain.  She states she has some residual numbness from a previous stroke but this has not changed.    She has a tunneled dialysis catheter that was placed 04/06/17 by Dr. Trula Slade.  She dialyzes in [place] with Dr. Hinda Lenis on M/W/F.    No Known Allergies  Current Outpatient Medications  Medication Sig Dispense Refill  . albuterol (PROVENTIL) (2.5 MG/3ML) 0.083% nebulizer solution USE 1 VIAL IN NEBULIZER 3 TIMES DAILY AS NEEDED FOR WHEEZING/SHORTNESS OF BREATH. 360 mL 0  . albuterol (PROVENTIL) (2.5 MG/3ML) 0.083% nebulizer solution INHALE 1 VIAL VIA NEBULIER 3 TIMES A DAY FOR 7 DAYS FOR SHORTNESS OF BREATH AND THEN D/C 75 mL 11  . ARTIFICIAL TEARS 1.4 % ophthalmic solution INSTILL 1 DROP INTO EITHER EYE UP TO 6 TIMES DAILY AS NEEDED FOR LUBRICATION 15 mL 11  . aspirin EC 81 MG tablet TAKE (1) TABLET BY MOUTH ONCE DAILY. 31 tablet 11  . citalopram (CELEXA) 20 MG tablet TAKE (1) TABLET BY MOUTH ONCE IN THE MORNING. 31 tablet 11  . diltiazem (CARDIZEM CD) 180 MG 24 hr capsule TAKE 1 CAPSULE BY MOUTH ONCE A DAY. 30 capsule 11  . docusate sodium (COLACE) 100 MG capsule TAKE 1 CAPSULE BY MOUTH TWICE DAILY. 62 capsule 11  . ipratropium (ATROVENT) 0.02 % nebulizer solution USE 1 VIAL IN NEBULIZER 3 TIMES DAILY AS NEEDED FOR WHEEZING/SHORTNESS OF BREATH. 150 mL 11  . isosorbide mononitrate (IMDUR) 60 MG 24 hr tablet TAKE (1) TABLET BY MOUTH ONCE DAILY. 31 tablet 11  . levothyroxine (SYNTHROID, LEVOTHROID) 25 MCG tablet TAKE (1) TABLET BY MOUTH ONCE DAILY BEFORE BREAKFAST. 31 tablet 11  . lidocaine-prilocaine (EMLA) cream APPLY TOPICALLY TO LEFT UPPER ARM 1 HOUR PRIOR TO DIALYSIS ON MONDAY,WEDNESDAY & FRIDAYS AS NEEDED FOR PAIN. WRAP WITH PLASTIC WRAP AFTER A 30 g 11  . mirtazapine (REMERON) 15 MG tablet TAKE 1/2  TABLET (7.5MG ) BY MOUTH AT BEDTIME FOR MOOD/APPETITE. 16 tablet 11  . Multiple Vitamin (TAB-A-VITE PO) Take 1 tablet by mouth daily.    . nitroGLYCERIN (NITROSTAT) 0.4 MG SL tablet DISSOLVE 1 TABLET UNDER THE TONGUE EVERY 5 MINUTES X2 DOSES THEN TO ER 25 tablet 11  . nystatin (MYCOSTATIN/NYSTOP) powder Apply topically 4 (four) times daily as needed. To groin/ abd folds for irritation    . OXYGEN Inhale 2 L into the lungs continuous.     . pantoprazole (PROTONIX) 40 MG tablet TAKE (1) TABLET BY MOUTH ONCE DAILY. 30 tablet 11  . polyethylene glycol powder (MIRALAX) powder Take 17 g by mouth daily.    . promethazine (PHENERGAN) 12.5 MG suppository Place 1 suppository (12.5 mg total) rectally every 6 (six) hours as needed for nausea or vomiting. 6 each 0  . SENSIPAR 30 MG tablet TAKE 1 TABLET BY MOUTH ONCE DAILY AFTER THE EVENING MEAL *PROVIDED BY[facility]VITA DIALYSIS* 28 tablet PRN  . sevelamer (RENVELA) 800 MG tablet Take 800-2,400 mg by mouth See admin instructions. Take 2400 mg by mouth three times daily with meals and take 800 mg by mouth once daily in the evening with snack.    . traMADol (ULTRAM) 50 MG tablet TAKE 1 TABLET BY MOUTH EVERY 12 HOURS AS NEEDED FOR MODERATE PAIN 30 tablet 1  . TUSSIN  DM 100-10 MG/5ML liquid TAKE ONE TEASPOONFUL BY MOUTH EVERY 4 HOURS AS NEEDED FOR COUGH 118 mL 11  . VENTOLIN HFA 108 (90 Base) MCG/ACT inhaler INHALE (2) PUFFS EVERY 4 HOURS AS NEEDED FOR WHEEZING OR SHORTNESS OFBREATH(COUGH). 18 g 11   No current facility-administered medications for this visit.      ROS:  See HPI  Physical Exam:  Vitals:   06/28/17 1353 06/28/17 1357  BP: (!) 200/92 (!) 192/95  Pulse: 84 83  Resp: 16   Temp: 98.3 F (36.8 C)    Vitals:   06/28/17 1353  Weight: 165 lb (74.8 kg)  Height: 5\' 6"  (1.676 m)   Body mass index is 26.63 kg/m.  Incision:  Healed nicely Extremities:  There is an excellent thrill within the fistula and bruit is auscultated. The fistula is easily  palpable throughout the upper arm. +palpble right radial pulse; motor & sensation are in tact.   Dialysis duplex 06/28/17: Diameter:  0.55cm-0.73cm Depth:  0.23cm-0.64cm   Assessment/Plan:  This is a 84 y.o. female who is s/p: Right brachial cephalic AVF on 0/88/11 by Dr. Oneida Alar  -the pt does not have any evidence of steal -the fistula is easily palpable and has an excellent thrill -the right arm fistula is maturing nicely and will be ready to use on 08/23/17.  Once it has been used 2-3 times successfully, the tunneled dialysis catheter can be removed.  -she will f/u with [facility] as needed.    Leontine Locket, PA-C [facility] [phone]  Clinic MD:  [place]

## 2017-06-29 DIAGNOSIS — Z992 Dependence on renal dialysis: Secondary | ICD-10-CM | POA: Diagnosis not present

## 2017-06-29 DIAGNOSIS — N186 End stage renal disease: Secondary | ICD-10-CM | POA: Diagnosis not present

## 2017-06-29 DIAGNOSIS — F0390 Unspecified dementia without behavioral disturbance: Secondary | ICD-10-CM | POA: Diagnosis not present

## 2017-06-30 DIAGNOSIS — F0390 Unspecified dementia without behavioral disturbance: Secondary | ICD-10-CM | POA: Diagnosis not present

## 2017-07-01 DIAGNOSIS — F0390 Unspecified dementia without behavioral disturbance: Secondary | ICD-10-CM | POA: Diagnosis not present

## 2017-07-01 DIAGNOSIS — N186 End stage renal disease: Secondary | ICD-10-CM | POA: Diagnosis not present

## 2017-07-01 DIAGNOSIS — Z992 Dependence on renal dialysis: Secondary | ICD-10-CM | POA: Diagnosis not present

## 2017-07-02 DIAGNOSIS — F0390 Unspecified dementia without behavioral disturbance: Secondary | ICD-10-CM | POA: Diagnosis not present

## 2017-07-03 DIAGNOSIS — F0390 Unspecified dementia without behavioral disturbance: Secondary | ICD-10-CM | POA: Diagnosis not present

## 2017-07-04 DIAGNOSIS — E785 Hyperlipidemia, unspecified: Secondary | ICD-10-CM | POA: Diagnosis not present

## 2017-07-04 DIAGNOSIS — Z992 Dependence on renal dialysis: Secondary | ICD-10-CM | POA: Diagnosis not present

## 2017-07-04 DIAGNOSIS — E119 Type 2 diabetes mellitus without complications: Secondary | ICD-10-CM | POA: Diagnosis not present

## 2017-07-04 DIAGNOSIS — Z79899 Other long term (current) drug therapy: Secondary | ICD-10-CM | POA: Diagnosis not present

## 2017-07-04 DIAGNOSIS — F0390 Unspecified dementia without behavioral disturbance: Secondary | ICD-10-CM | POA: Diagnosis not present

## 2017-07-04 DIAGNOSIS — N186 End stage renal disease: Secondary | ICD-10-CM | POA: Diagnosis not present

## 2017-07-05 DIAGNOSIS — F0390 Unspecified dementia without behavioral disturbance: Secondary | ICD-10-CM | POA: Diagnosis not present

## 2017-07-06 DIAGNOSIS — Z992 Dependence on renal dialysis: Secondary | ICD-10-CM | POA: Diagnosis not present

## 2017-07-06 DIAGNOSIS — N186 End stage renal disease: Secondary | ICD-10-CM | POA: Diagnosis not present

## 2017-07-06 DIAGNOSIS — F0390 Unspecified dementia without behavioral disturbance: Secondary | ICD-10-CM | POA: Diagnosis not present

## 2017-07-07 DIAGNOSIS — F0390 Unspecified dementia without behavioral disturbance: Secondary | ICD-10-CM | POA: Diagnosis not present

## 2017-07-08 DIAGNOSIS — F0390 Unspecified dementia without behavioral disturbance: Secondary | ICD-10-CM | POA: Diagnosis not present

## 2017-07-08 DIAGNOSIS — N186 End stage renal disease: Secondary | ICD-10-CM | POA: Diagnosis not present

## 2017-07-08 DIAGNOSIS — Z992 Dependence on renal dialysis: Secondary | ICD-10-CM | POA: Diagnosis not present

## 2017-07-09 DIAGNOSIS — F0390 Unspecified dementia without behavioral disturbance: Secondary | ICD-10-CM | POA: Diagnosis not present

## 2017-07-10 DIAGNOSIS — Z992 Dependence on renal dialysis: Secondary | ICD-10-CM | POA: Diagnosis not present

## 2017-07-10 DIAGNOSIS — N186 End stage renal disease: Secondary | ICD-10-CM | POA: Diagnosis not present

## 2017-07-10 DIAGNOSIS — F0390 Unspecified dementia without behavioral disturbance: Secondary | ICD-10-CM | POA: Diagnosis not present

## 2017-07-11 DIAGNOSIS — N186 End stage renal disease: Secondary | ICD-10-CM | POA: Diagnosis not present

## 2017-07-11 DIAGNOSIS — Z992 Dependence on renal dialysis: Secondary | ICD-10-CM | POA: Diagnosis not present

## 2017-07-11 DIAGNOSIS — F0390 Unspecified dementia without behavioral disturbance: Secondary | ICD-10-CM | POA: Diagnosis not present

## 2017-07-12 DIAGNOSIS — F0390 Unspecified dementia without behavioral disturbance: Secondary | ICD-10-CM | POA: Diagnosis not present

## 2017-07-13 DIAGNOSIS — F0390 Unspecified dementia without behavioral disturbance: Secondary | ICD-10-CM | POA: Diagnosis not present

## 2017-07-13 DIAGNOSIS — Z992 Dependence on renal dialysis: Secondary | ICD-10-CM | POA: Diagnosis not present

## 2017-07-13 DIAGNOSIS — N186 End stage renal disease: Secondary | ICD-10-CM | POA: Diagnosis not present

## 2017-07-14 DIAGNOSIS — F0390 Unspecified dementia without behavioral disturbance: Secondary | ICD-10-CM | POA: Diagnosis not present

## 2017-07-14 DIAGNOSIS — J9610 Chronic respiratory failure, unspecified whether with hypoxia or hypercapnia: Secondary | ICD-10-CM | POA: Diagnosis not present

## 2017-07-14 DIAGNOSIS — I509 Heart failure, unspecified: Secondary | ICD-10-CM | POA: Diagnosis not present

## 2017-07-14 DIAGNOSIS — I5032 Chronic diastolic (congestive) heart failure: Secondary | ICD-10-CM | POA: Diagnosis not present

## 2017-07-14 DIAGNOSIS — J449 Chronic obstructive pulmonary disease, unspecified: Secondary | ICD-10-CM | POA: Diagnosis not present

## 2017-07-14 DIAGNOSIS — R0602 Shortness of breath: Secondary | ICD-10-CM | POA: Diagnosis not present

## 2017-07-14 DIAGNOSIS — R2681 Unsteadiness on feet: Secondary | ICD-10-CM | POA: Diagnosis not present

## 2017-07-14 DIAGNOSIS — R296 Repeated falls: Secondary | ICD-10-CM | POA: Diagnosis not present

## 2017-07-14 DIAGNOSIS — R269 Unspecified abnormalities of gait and mobility: Secondary | ICD-10-CM | POA: Diagnosis not present

## 2017-07-15 DIAGNOSIS — Z992 Dependence on renal dialysis: Secondary | ICD-10-CM | POA: Diagnosis not present

## 2017-07-15 DIAGNOSIS — F0390 Unspecified dementia without behavioral disturbance: Secondary | ICD-10-CM | POA: Diagnosis not present

## 2017-07-15 DIAGNOSIS — N186 End stage renal disease: Secondary | ICD-10-CM | POA: Diagnosis not present

## 2017-07-16 DIAGNOSIS — F0390 Unspecified dementia without behavioral disturbance: Secondary | ICD-10-CM | POA: Diagnosis not present

## 2017-07-17 DIAGNOSIS — F0390 Unspecified dementia without behavioral disturbance: Secondary | ICD-10-CM | POA: Diagnosis not present

## 2017-07-18 DIAGNOSIS — Z992 Dependence on renal dialysis: Secondary | ICD-10-CM | POA: Diagnosis not present

## 2017-07-18 DIAGNOSIS — F0390 Unspecified dementia without behavioral disturbance: Secondary | ICD-10-CM | POA: Diagnosis not present

## 2017-07-18 DIAGNOSIS — N186 End stage renal disease: Secondary | ICD-10-CM | POA: Diagnosis not present

## 2017-07-19 DIAGNOSIS — H34812 Central retinal vein occlusion, left eye, with macular edema: Secondary | ICD-10-CM | POA: Diagnosis not present

## 2017-07-19 DIAGNOSIS — F0390 Unspecified dementia without behavioral disturbance: Secondary | ICD-10-CM | POA: Diagnosis not present

## 2017-07-19 DIAGNOSIS — E113293 Type 2 diabetes mellitus with mild nonproliferative diabetic retinopathy without macular edema, bilateral: Secondary | ICD-10-CM | POA: Diagnosis not present

## 2017-07-19 DIAGNOSIS — H35371 Puckering of macula, right eye: Secondary | ICD-10-CM | POA: Diagnosis not present

## 2017-07-19 DIAGNOSIS — H43813 Vitreous degeneration, bilateral: Secondary | ICD-10-CM | POA: Diagnosis not present

## 2017-07-20 DIAGNOSIS — F0390 Unspecified dementia without behavioral disturbance: Secondary | ICD-10-CM | POA: Diagnosis not present

## 2017-07-20 DIAGNOSIS — N186 End stage renal disease: Secondary | ICD-10-CM | POA: Diagnosis not present

## 2017-07-20 DIAGNOSIS — Z992 Dependence on renal dialysis: Secondary | ICD-10-CM | POA: Diagnosis not present

## 2017-07-21 DIAGNOSIS — F0390 Unspecified dementia without behavioral disturbance: Secondary | ICD-10-CM | POA: Diagnosis not present

## 2017-07-22 DIAGNOSIS — F0390 Unspecified dementia without behavioral disturbance: Secondary | ICD-10-CM | POA: Diagnosis not present

## 2017-07-22 DIAGNOSIS — N186 End stage renal disease: Secondary | ICD-10-CM | POA: Diagnosis not present

## 2017-07-22 DIAGNOSIS — Z992 Dependence on renal dialysis: Secondary | ICD-10-CM | POA: Diagnosis not present

## 2017-07-23 DIAGNOSIS — F0390 Unspecified dementia without behavioral disturbance: Secondary | ICD-10-CM | POA: Diagnosis not present

## 2017-07-24 DIAGNOSIS — F0390 Unspecified dementia without behavioral disturbance: Secondary | ICD-10-CM | POA: Diagnosis not present

## 2017-07-25 DIAGNOSIS — Z992 Dependence on renal dialysis: Secondary | ICD-10-CM | POA: Diagnosis not present

## 2017-07-25 DIAGNOSIS — F0390 Unspecified dementia without behavioral disturbance: Secondary | ICD-10-CM | POA: Diagnosis not present

## 2017-07-25 DIAGNOSIS — N186 End stage renal disease: Secondary | ICD-10-CM | POA: Diagnosis not present

## 2017-07-26 DIAGNOSIS — F0390 Unspecified dementia without behavioral disturbance: Secondary | ICD-10-CM | POA: Diagnosis not present

## 2017-07-27 DIAGNOSIS — N186 End stage renal disease: Secondary | ICD-10-CM | POA: Diagnosis not present

## 2017-07-27 DIAGNOSIS — F0390 Unspecified dementia without behavioral disturbance: Secondary | ICD-10-CM | POA: Diagnosis not present

## 2017-07-27 DIAGNOSIS — Z992 Dependence on renal dialysis: Secondary | ICD-10-CM | POA: Diagnosis not present

## 2017-07-28 DIAGNOSIS — F0390 Unspecified dementia without behavioral disturbance: Secondary | ICD-10-CM | POA: Diagnosis not present

## 2017-07-29 DIAGNOSIS — Z992 Dependence on renal dialysis: Secondary | ICD-10-CM | POA: Diagnosis not present

## 2017-07-29 DIAGNOSIS — F0390 Unspecified dementia without behavioral disturbance: Secondary | ICD-10-CM | POA: Diagnosis not present

## 2017-07-29 DIAGNOSIS — N186 End stage renal disease: Secondary | ICD-10-CM | POA: Diagnosis not present

## 2017-07-30 DIAGNOSIS — F0390 Unspecified dementia without behavioral disturbance: Secondary | ICD-10-CM | POA: Diagnosis not present

## 2017-07-31 DIAGNOSIS — F0390 Unspecified dementia without behavioral disturbance: Secondary | ICD-10-CM | POA: Diagnosis not present

## 2017-08-01 DIAGNOSIS — Z992 Dependence on renal dialysis: Secondary | ICD-10-CM | POA: Diagnosis not present

## 2017-08-01 DIAGNOSIS — N186 End stage renal disease: Secondary | ICD-10-CM | POA: Diagnosis not present

## 2017-08-01 DIAGNOSIS — F0390 Unspecified dementia without behavioral disturbance: Secondary | ICD-10-CM | POA: Diagnosis not present

## 2017-08-02 DIAGNOSIS — F0390 Unspecified dementia without behavioral disturbance: Secondary | ICD-10-CM | POA: Diagnosis not present

## 2017-08-03 DIAGNOSIS — N186 End stage renal disease: Secondary | ICD-10-CM | POA: Diagnosis not present

## 2017-08-03 DIAGNOSIS — F0390 Unspecified dementia without behavioral disturbance: Secondary | ICD-10-CM | POA: Diagnosis not present

## 2017-08-03 DIAGNOSIS — Z992 Dependence on renal dialysis: Secondary | ICD-10-CM | POA: Diagnosis not present

## 2017-08-04 DIAGNOSIS — F0390 Unspecified dementia without behavioral disturbance: Secondary | ICD-10-CM | POA: Diagnosis not present

## 2017-08-05 DIAGNOSIS — F0390 Unspecified dementia without behavioral disturbance: Secondary | ICD-10-CM | POA: Diagnosis not present

## 2017-08-05 DIAGNOSIS — Z992 Dependence on renal dialysis: Secondary | ICD-10-CM | POA: Diagnosis not present

## 2017-08-05 DIAGNOSIS — N186 End stage renal disease: Secondary | ICD-10-CM | POA: Diagnosis not present

## 2017-08-06 DIAGNOSIS — F0390 Unspecified dementia without behavioral disturbance: Secondary | ICD-10-CM | POA: Diagnosis not present

## 2017-08-07 DIAGNOSIS — F0390 Unspecified dementia without behavioral disturbance: Secondary | ICD-10-CM | POA: Diagnosis not present

## 2017-08-08 DIAGNOSIS — N186 End stage renal disease: Secondary | ICD-10-CM | POA: Diagnosis not present

## 2017-08-08 DIAGNOSIS — Z992 Dependence on renal dialysis: Secondary | ICD-10-CM | POA: Diagnosis not present

## 2017-08-08 DIAGNOSIS — F0390 Unspecified dementia without behavioral disturbance: Secondary | ICD-10-CM | POA: Diagnosis not present

## 2017-08-09 DIAGNOSIS — F0390 Unspecified dementia without behavioral disturbance: Secondary | ICD-10-CM | POA: Diagnosis not present

## 2017-08-10 DIAGNOSIS — Z992 Dependence on renal dialysis: Secondary | ICD-10-CM | POA: Diagnosis not present

## 2017-08-10 DIAGNOSIS — N186 End stage renal disease: Secondary | ICD-10-CM | POA: Diagnosis not present

## 2017-08-10 DIAGNOSIS — F0390 Unspecified dementia without behavioral disturbance: Secondary | ICD-10-CM | POA: Diagnosis not present

## 2017-08-11 DIAGNOSIS — F0390 Unspecified dementia without behavioral disturbance: Secondary | ICD-10-CM | POA: Diagnosis not present

## 2017-08-12 DIAGNOSIS — F0390 Unspecified dementia without behavioral disturbance: Secondary | ICD-10-CM | POA: Diagnosis not present

## 2017-08-12 DIAGNOSIS — N186 End stage renal disease: Secondary | ICD-10-CM | POA: Diagnosis not present

## 2017-08-12 DIAGNOSIS — Z992 Dependence on renal dialysis: Secondary | ICD-10-CM | POA: Diagnosis not present

## 2017-08-13 DIAGNOSIS — F0390 Unspecified dementia without behavioral disturbance: Secondary | ICD-10-CM | POA: Diagnosis not present

## 2017-08-14 DIAGNOSIS — J9610 Chronic respiratory failure, unspecified whether with hypoxia or hypercapnia: Secondary | ICD-10-CM | POA: Diagnosis not present

## 2017-08-14 DIAGNOSIS — I509 Heart failure, unspecified: Secondary | ICD-10-CM | POA: Diagnosis not present

## 2017-08-14 DIAGNOSIS — R269 Unspecified abnormalities of gait and mobility: Secondary | ICD-10-CM | POA: Diagnosis not present

## 2017-08-14 DIAGNOSIS — I5032 Chronic diastolic (congestive) heart failure: Secondary | ICD-10-CM | POA: Diagnosis not present

## 2017-08-14 DIAGNOSIS — J449 Chronic obstructive pulmonary disease, unspecified: Secondary | ICD-10-CM | POA: Diagnosis not present

## 2017-08-14 DIAGNOSIS — R0602 Shortness of breath: Secondary | ICD-10-CM | POA: Diagnosis not present

## 2017-08-14 DIAGNOSIS — F0390 Unspecified dementia without behavioral disturbance: Secondary | ICD-10-CM | POA: Diagnosis not present

## 2017-08-14 DIAGNOSIS — R2681 Unsteadiness on feet: Secondary | ICD-10-CM | POA: Diagnosis not present

## 2017-08-14 DIAGNOSIS — R296 Repeated falls: Secondary | ICD-10-CM | POA: Diagnosis not present

## 2017-08-15 DIAGNOSIS — F0390 Unspecified dementia without behavioral disturbance: Secondary | ICD-10-CM | POA: Diagnosis not present

## 2017-08-15 DIAGNOSIS — N186 End stage renal disease: Secondary | ICD-10-CM | POA: Diagnosis not present

## 2017-08-15 DIAGNOSIS — Z992 Dependence on renal dialysis: Secondary | ICD-10-CM | POA: Diagnosis not present

## 2017-08-16 DIAGNOSIS — F0390 Unspecified dementia without behavioral disturbance: Secondary | ICD-10-CM | POA: Diagnosis not present

## 2017-08-17 DIAGNOSIS — Z992 Dependence on renal dialysis: Secondary | ICD-10-CM | POA: Diagnosis not present

## 2017-08-17 DIAGNOSIS — N186 End stage renal disease: Secondary | ICD-10-CM | POA: Diagnosis not present

## 2017-08-17 DIAGNOSIS — F0390 Unspecified dementia without behavioral disturbance: Secondary | ICD-10-CM | POA: Diagnosis not present

## 2017-08-18 DIAGNOSIS — F0390 Unspecified dementia without behavioral disturbance: Secondary | ICD-10-CM | POA: Diagnosis not present

## 2017-08-19 DIAGNOSIS — N186 End stage renal disease: Secondary | ICD-10-CM | POA: Diagnosis not present

## 2017-08-19 DIAGNOSIS — Z992 Dependence on renal dialysis: Secondary | ICD-10-CM | POA: Diagnosis not present

## 2017-08-19 DIAGNOSIS — F0390 Unspecified dementia without behavioral disturbance: Secondary | ICD-10-CM | POA: Diagnosis not present

## 2017-08-20 DIAGNOSIS — F0390 Unspecified dementia without behavioral disturbance: Secondary | ICD-10-CM | POA: Diagnosis not present

## 2017-08-21 DIAGNOSIS — F0390 Unspecified dementia without behavioral disturbance: Secondary | ICD-10-CM | POA: Diagnosis not present

## 2017-08-22 DIAGNOSIS — N186 End stage renal disease: Secondary | ICD-10-CM | POA: Diagnosis not present

## 2017-08-22 DIAGNOSIS — Z992 Dependence on renal dialysis: Secondary | ICD-10-CM | POA: Diagnosis not present

## 2017-08-22 DIAGNOSIS — F0390 Unspecified dementia without behavioral disturbance: Secondary | ICD-10-CM | POA: Diagnosis not present

## 2017-08-23 ENCOUNTER — Other Ambulatory Visit: Payer: Self-pay | Admitting: Family Medicine

## 2017-08-23 DIAGNOSIS — F0390 Unspecified dementia without behavioral disturbance: Secondary | ICD-10-CM | POA: Diagnosis not present

## 2017-08-24 DIAGNOSIS — Z992 Dependence on renal dialysis: Secondary | ICD-10-CM | POA: Diagnosis not present

## 2017-08-24 DIAGNOSIS — N186 End stage renal disease: Secondary | ICD-10-CM | POA: Diagnosis not present

## 2017-08-24 DIAGNOSIS — F0390 Unspecified dementia without behavioral disturbance: Secondary | ICD-10-CM | POA: Diagnosis not present

## 2017-08-25 DIAGNOSIS — F0390 Unspecified dementia without behavioral disturbance: Secondary | ICD-10-CM | POA: Diagnosis not present

## 2017-08-26 DIAGNOSIS — N186 End stage renal disease: Secondary | ICD-10-CM | POA: Diagnosis not present

## 2017-08-26 DIAGNOSIS — Z992 Dependence on renal dialysis: Secondary | ICD-10-CM | POA: Diagnosis not present

## 2017-08-26 DIAGNOSIS — F0390 Unspecified dementia without behavioral disturbance: Secondary | ICD-10-CM | POA: Diagnosis not present

## 2017-08-27 DIAGNOSIS — F0390 Unspecified dementia without behavioral disturbance: Secondary | ICD-10-CM | POA: Diagnosis not present

## 2017-08-28 DIAGNOSIS — F0390 Unspecified dementia without behavioral disturbance: Secondary | ICD-10-CM | POA: Diagnosis not present

## 2017-08-29 ENCOUNTER — Other Ambulatory Visit (HOSPITAL_COMMUNITY): Payer: Self-pay | Admitting: Nephrology

## 2017-08-29 DIAGNOSIS — N186 End stage renal disease: Secondary | ICD-10-CM

## 2017-08-29 DIAGNOSIS — Z992 Dependence on renal dialysis: Secondary | ICD-10-CM | POA: Diagnosis not present

## 2017-08-29 DIAGNOSIS — F0390 Unspecified dementia without behavioral disturbance: Secondary | ICD-10-CM | POA: Diagnosis not present

## 2017-08-30 DIAGNOSIS — L84 Corns and callosities: Secondary | ICD-10-CM | POA: Diagnosis not present

## 2017-08-30 DIAGNOSIS — F0390 Unspecified dementia without behavioral disturbance: Secondary | ICD-10-CM | POA: Diagnosis not present

## 2017-08-30 DIAGNOSIS — E1051 Type 1 diabetes mellitus with diabetic peripheral angiopathy without gangrene: Secondary | ICD-10-CM | POA: Diagnosis not present

## 2017-08-31 DIAGNOSIS — Z992 Dependence on renal dialysis: Secondary | ICD-10-CM | POA: Diagnosis not present

## 2017-08-31 DIAGNOSIS — F0390 Unspecified dementia without behavioral disturbance: Secondary | ICD-10-CM | POA: Diagnosis not present

## 2017-08-31 DIAGNOSIS — N186 End stage renal disease: Secondary | ICD-10-CM | POA: Diagnosis not present

## 2017-09-01 ENCOUNTER — Ambulatory Visit (HOSPITAL_COMMUNITY): Payer: Medicare HMO

## 2017-09-01 DIAGNOSIS — F0390 Unspecified dementia without behavioral disturbance: Secondary | ICD-10-CM | POA: Diagnosis not present

## 2017-09-02 DIAGNOSIS — Z992 Dependence on renal dialysis: Secondary | ICD-10-CM | POA: Diagnosis not present

## 2017-09-02 DIAGNOSIS — N186 End stage renal disease: Secondary | ICD-10-CM | POA: Diagnosis not present

## 2017-09-02 DIAGNOSIS — F0390 Unspecified dementia without behavioral disturbance: Secondary | ICD-10-CM | POA: Diagnosis not present

## 2017-09-03 DIAGNOSIS — F0390 Unspecified dementia without behavioral disturbance: Secondary | ICD-10-CM | POA: Diagnosis not present

## 2017-09-04 DIAGNOSIS — F0390 Unspecified dementia without behavioral disturbance: Secondary | ICD-10-CM | POA: Diagnosis not present

## 2017-09-05 DIAGNOSIS — F0390 Unspecified dementia without behavioral disturbance: Secondary | ICD-10-CM | POA: Diagnosis not present

## 2017-09-05 DIAGNOSIS — N186 End stage renal disease: Secondary | ICD-10-CM | POA: Diagnosis not present

## 2017-09-05 DIAGNOSIS — Z992 Dependence on renal dialysis: Secondary | ICD-10-CM | POA: Diagnosis not present

## 2017-09-06 DIAGNOSIS — F0390 Unspecified dementia without behavioral disturbance: Secondary | ICD-10-CM | POA: Diagnosis not present

## 2017-09-07 DIAGNOSIS — F0390 Unspecified dementia without behavioral disturbance: Secondary | ICD-10-CM | POA: Diagnosis not present

## 2017-09-07 DIAGNOSIS — N186 End stage renal disease: Secondary | ICD-10-CM | POA: Diagnosis not present

## 2017-09-07 DIAGNOSIS — Z992 Dependence on renal dialysis: Secondary | ICD-10-CM | POA: Diagnosis not present

## 2017-09-08 ENCOUNTER — Ambulatory Visit (HOSPITAL_COMMUNITY): Payer: Medicare HMO

## 2017-09-08 ENCOUNTER — Encounter (HOSPITAL_COMMUNITY): Payer: Self-pay

## 2017-09-08 DIAGNOSIS — F0390 Unspecified dementia without behavioral disturbance: Secondary | ICD-10-CM | POA: Diagnosis not present

## 2017-09-09 DIAGNOSIS — Z992 Dependence on renal dialysis: Secondary | ICD-10-CM | POA: Diagnosis not present

## 2017-09-09 DIAGNOSIS — F0390 Unspecified dementia without behavioral disturbance: Secondary | ICD-10-CM | POA: Diagnosis not present

## 2017-09-09 DIAGNOSIS — N186 End stage renal disease: Secondary | ICD-10-CM | POA: Diagnosis not present

## 2017-09-10 DIAGNOSIS — F0390 Unspecified dementia without behavioral disturbance: Secondary | ICD-10-CM | POA: Diagnosis not present

## 2017-09-11 DIAGNOSIS — F0390 Unspecified dementia without behavioral disturbance: Secondary | ICD-10-CM | POA: Diagnosis not present

## 2017-09-12 DIAGNOSIS — Z992 Dependence on renal dialysis: Secondary | ICD-10-CM | POA: Diagnosis not present

## 2017-09-12 DIAGNOSIS — N186 End stage renal disease: Secondary | ICD-10-CM | POA: Diagnosis not present

## 2017-09-12 DIAGNOSIS — Z23 Encounter for immunization: Secondary | ICD-10-CM | POA: Diagnosis not present

## 2017-09-12 DIAGNOSIS — F0390 Unspecified dementia without behavioral disturbance: Secondary | ICD-10-CM | POA: Diagnosis not present

## 2017-09-13 DIAGNOSIS — F0390 Unspecified dementia without behavioral disturbance: Secondary | ICD-10-CM | POA: Diagnosis not present

## 2017-09-14 DIAGNOSIS — Z992 Dependence on renal dialysis: Secondary | ICD-10-CM | POA: Diagnosis not present

## 2017-09-14 DIAGNOSIS — F0390 Unspecified dementia without behavioral disturbance: Secondary | ICD-10-CM | POA: Diagnosis not present

## 2017-09-14 DIAGNOSIS — R269 Unspecified abnormalities of gait and mobility: Secondary | ICD-10-CM | POA: Diagnosis not present

## 2017-09-14 DIAGNOSIS — I5032 Chronic diastolic (congestive) heart failure: Secondary | ICD-10-CM | POA: Diagnosis not present

## 2017-09-14 DIAGNOSIS — I509 Heart failure, unspecified: Secondary | ICD-10-CM | POA: Diagnosis not present

## 2017-09-14 DIAGNOSIS — R0602 Shortness of breath: Secondary | ICD-10-CM | POA: Diagnosis not present

## 2017-09-14 DIAGNOSIS — R296 Repeated falls: Secondary | ICD-10-CM | POA: Diagnosis not present

## 2017-09-14 DIAGNOSIS — J449 Chronic obstructive pulmonary disease, unspecified: Secondary | ICD-10-CM | POA: Diagnosis not present

## 2017-09-14 DIAGNOSIS — R2681 Unsteadiness on feet: Secondary | ICD-10-CM | POA: Diagnosis not present

## 2017-09-14 DIAGNOSIS — Z23 Encounter for immunization: Secondary | ICD-10-CM | POA: Diagnosis not present

## 2017-09-14 DIAGNOSIS — J9610 Chronic respiratory failure, unspecified whether with hypoxia or hypercapnia: Secondary | ICD-10-CM | POA: Diagnosis not present

## 2017-09-14 DIAGNOSIS — N186 End stage renal disease: Secondary | ICD-10-CM | POA: Diagnosis not present

## 2017-09-15 DIAGNOSIS — F0390 Unspecified dementia without behavioral disturbance: Secondary | ICD-10-CM | POA: Diagnosis not present

## 2017-09-16 DIAGNOSIS — N186 End stage renal disease: Secondary | ICD-10-CM | POA: Diagnosis not present

## 2017-09-16 DIAGNOSIS — Z992 Dependence on renal dialysis: Secondary | ICD-10-CM | POA: Diagnosis not present

## 2017-09-16 DIAGNOSIS — F0390 Unspecified dementia without behavioral disturbance: Secondary | ICD-10-CM | POA: Diagnosis not present

## 2017-09-16 DIAGNOSIS — Z23 Encounter for immunization: Secondary | ICD-10-CM | POA: Diagnosis not present

## 2017-09-17 DIAGNOSIS — F0390 Unspecified dementia without behavioral disturbance: Secondary | ICD-10-CM | POA: Diagnosis not present

## 2017-09-18 DIAGNOSIS — F0390 Unspecified dementia without behavioral disturbance: Secondary | ICD-10-CM | POA: Diagnosis not present

## 2017-09-19 DIAGNOSIS — Z992 Dependence on renal dialysis: Secondary | ICD-10-CM | POA: Diagnosis not present

## 2017-09-19 DIAGNOSIS — N186 End stage renal disease: Secondary | ICD-10-CM | POA: Diagnosis not present

## 2017-09-19 DIAGNOSIS — Z23 Encounter for immunization: Secondary | ICD-10-CM | POA: Diagnosis not present

## 2017-09-19 DIAGNOSIS — F0390 Unspecified dementia without behavioral disturbance: Secondary | ICD-10-CM | POA: Diagnosis not present

## 2017-09-20 DIAGNOSIS — F0390 Unspecified dementia without behavioral disturbance: Secondary | ICD-10-CM | POA: Diagnosis not present

## 2017-09-21 DIAGNOSIS — F0390 Unspecified dementia without behavioral disturbance: Secondary | ICD-10-CM | POA: Diagnosis not present

## 2017-09-21 DIAGNOSIS — Z992 Dependence on renal dialysis: Secondary | ICD-10-CM | POA: Diagnosis not present

## 2017-09-21 DIAGNOSIS — Z23 Encounter for immunization: Secondary | ICD-10-CM | POA: Diagnosis not present

## 2017-09-21 DIAGNOSIS — N186 End stage renal disease: Secondary | ICD-10-CM | POA: Diagnosis not present

## 2017-09-22 DIAGNOSIS — F0390 Unspecified dementia without behavioral disturbance: Secondary | ICD-10-CM | POA: Diagnosis not present

## 2017-09-23 DIAGNOSIS — F0390 Unspecified dementia without behavioral disturbance: Secondary | ICD-10-CM | POA: Diagnosis not present

## 2017-09-23 DIAGNOSIS — Z992 Dependence on renal dialysis: Secondary | ICD-10-CM | POA: Diagnosis not present

## 2017-09-23 DIAGNOSIS — N186 End stage renal disease: Secondary | ICD-10-CM | POA: Diagnosis not present

## 2017-09-23 DIAGNOSIS — Z23 Encounter for immunization: Secondary | ICD-10-CM | POA: Diagnosis not present

## 2017-09-24 DIAGNOSIS — F0390 Unspecified dementia without behavioral disturbance: Secondary | ICD-10-CM | POA: Diagnosis not present

## 2017-09-25 DIAGNOSIS — F0390 Unspecified dementia without behavioral disturbance: Secondary | ICD-10-CM | POA: Diagnosis not present

## 2017-09-26 DIAGNOSIS — N186 End stage renal disease: Secondary | ICD-10-CM | POA: Diagnosis not present

## 2017-09-26 DIAGNOSIS — Z992 Dependence on renal dialysis: Secondary | ICD-10-CM | POA: Diagnosis not present

## 2017-09-26 DIAGNOSIS — F0390 Unspecified dementia without behavioral disturbance: Secondary | ICD-10-CM | POA: Diagnosis not present

## 2017-09-26 DIAGNOSIS — Z23 Encounter for immunization: Secondary | ICD-10-CM | POA: Diagnosis not present

## 2017-09-27 DIAGNOSIS — F0390 Unspecified dementia without behavioral disturbance: Secondary | ICD-10-CM | POA: Diagnosis not present

## 2017-09-28 DIAGNOSIS — Z23 Encounter for immunization: Secondary | ICD-10-CM | POA: Diagnosis not present

## 2017-09-28 DIAGNOSIS — N186 End stage renal disease: Secondary | ICD-10-CM | POA: Diagnosis not present

## 2017-09-28 DIAGNOSIS — F0390 Unspecified dementia without behavioral disturbance: Secondary | ICD-10-CM | POA: Diagnosis not present

## 2017-09-28 DIAGNOSIS — Z992 Dependence on renal dialysis: Secondary | ICD-10-CM | POA: Diagnosis not present

## 2017-09-29 ENCOUNTER — Ambulatory Visit (INDEPENDENT_AMBULATORY_CARE_PROVIDER_SITE_OTHER): Payer: Medicare HMO | Admitting: Physician Assistant

## 2017-09-29 ENCOUNTER — Other Ambulatory Visit: Payer: Self-pay

## 2017-09-29 VITALS — BP 211/83 | HR 74 | Temp 98.4°F | Resp 20 | Ht 66.0 in | Wt 168.8 lb

## 2017-09-29 DIAGNOSIS — Z789 Other specified health status: Secondary | ICD-10-CM | POA: Diagnosis not present

## 2017-09-29 DIAGNOSIS — Z992 Dependence on renal dialysis: Secondary | ICD-10-CM | POA: Diagnosis not present

## 2017-09-29 DIAGNOSIS — F0390 Unspecified dementia without behavioral disturbance: Secondary | ICD-10-CM | POA: Diagnosis not present

## 2017-09-29 DIAGNOSIS — N186 End stage renal disease: Secondary | ICD-10-CM

## 2017-09-29 NOTE — Progress Notes (Addendum)
Established Dialysis Access   History of Present Illness   [name] is a 84 y.o. (12-11-1925) female who presents for re-evaluation of R arm brachiocephalic fistula.  This was created by Dr. Oneida Alar 05/2017.  Fistula was infiltrated about 2 weeks ago so HD has been continued with R IJ TDC.  She resides in a nursing facility in [place].  ESRD is managed by Dr. Lowanda Foster.  She is also complaining of R leg weakness and leg "giving out" and believes this is related to her fistula surgery in 05/2017.  She denies any trauma.  She has had a prior CVA with persistent R hand weakness.  She denies any slurring speech, changes in vision, or one sided weakness.  Current Outpatient Medications  Medication Sig Dispense Refill  . albuterol (PROVENTIL) (2.5 MG/3ML) 0.083% nebulizer solution USE 1 VIAL IN NEBULIZER 3 TIMES DAILY AS NEEDED FOR WHEEZING/SHORTNESS OF BREATH. 360 mL 0  . albuterol (PROVENTIL) (2.5 MG/3ML) 0.083% nebulizer solution INHALE 1 VIAL VIA NEBULIER 3 TIMES A DAY FOR 7 DAYS FOR SHORTNESS OF BREATH AND THEN D/C 75 mL 11  . ARTIFICIAL TEARS 1.4 % ophthalmic solution INSTILL 1 DROP INTO EITHER EYE UP TO 6 TIMES DAILY AS NEEDED FOR LUBRICATION 15 mL 11  . aspirin EC 81 MG tablet TAKE (1) TABLET BY MOUTH ONCE DAILY. 31 tablet 11  . citalopram (CELEXA) 20 MG tablet TAKE (1) TABLET BY MOUTH ONCE IN THE MORNING. 31 tablet 11  . diltiazem (CARDIZEM CD) 180 MG 24 hr capsule TAKE 1 CAPSULE BY MOUTH ONCE A DAY. 30 capsule 11  . docusate sodium (COLACE) 100 MG capsule TAKE 1 CAPSULE BY MOUTH TWICE DAILY. 62 capsule 11  . ipratropium (ATROVENT) 0.02 % nebulizer solution USE 1 VIAL IN NEBULIZER 3 TIMES DAILY AS NEEDED FOR WHEEZING/SHORTNESS OF BREATH. 150 mL 11  . isosorbide mononitrate (IMDUR) 60 MG 24 hr tablet TAKE (1) TABLET BY MOUTH ONCE DAILY. 31 tablet 11  . levothyroxine (SYNTHROID, LEVOTHROID) 25 MCG tablet TAKE (1) TABLET BY MOUTH ONCE DAILY BEFORE BREAKFAST. 31 tablet 11  .  lidocaine-prilocaine (EMLA) cream APPLY TOPICALLY TO LEFT UPPER ARM 1 HOUR PRIOR TO DIALYSIS ON MONDAY,WEDNESDAY & FRIDAYS AS NEEDED FOR PAIN. WRAP WITH PLASTIC WRAP AFTER A 30 g 11  . mirtazapine (REMERON) 15 MG tablet TAKE 1/2 TABLET (7.5MG ) BY MOUTH AT BEDTIME FOR MOOD/APPETITE. 16 tablet 11  . Multiple Vitamin (TAB-A-VITE PO) Take 1 tablet by mouth daily.    . Multiple Vitamins-Iron (MULTIVITAMINS WITH IRON) TABS tablet Take 1 tablet by mouth daily.    . nitroGLYCERIN (NITROSTAT) 0.4 MG SL tablet DISSOLVE 1 TABLET UNDER THE TONGUE EVERY 5 MINUTES X2 DOSES THEN TO ER 25 tablet 11  . nystatin (MYCOSTATIN/NYSTOP) powder Apply topically 4 (four) times daily as needed. To groin/ abd folds for irritation    . OXYGEN Inhale 2 L into the lungs continuous.     . pantoprazole (PROTONIX) 40 MG tablet TAKE (1) TABLET BY MOUTH ONCE DAILY. 30 tablet 11  . polyethylene glycol powder (MIRALAX) powder Take 17 g by mouth daily.    . promethazine (PHENERGAN) 12.5 MG suppository Place 1 suppository (12.5 mg total) rectally every 6 (six) hours as needed for nausea or vomiting. 6 each 0  . SENSIPAR 30 MG tablet TAKE 1 TABLET BY MOUTH ONCE DAILY AFTER THE EVENING MEAL *PROVIDED BYDAVITA DIALYSIS* 28 tablet PRN  . sevelamer (RENVELA) 800 MG tablet Take 800-2,400 mg by mouth See admin instructions. Take  2400 mg by mouth three times daily with meals and take 800 mg by mouth once daily in the evening with snack.    . traMADol (ULTRAM) 50 MG tablet TAKE 1 TABLET BY MOUTH EVERY 12 HOURS AS NEEDED FOR MODERATE PAIN 30 tablet 1  . TUSSIN DM 100-10 MG/5ML liquid TAKE ONE TEASPOONFUL BY MOUTH EVERY 4 HOURS AS NEEDED FOR COUGH 118 mL 11  . VENTOLIN HFA 108 (90 Base) MCG/ACT inhaler INHALE (2) PUFFS EVERY 4 HOURS AS NEEDED FOR WHEEZING OR SHORTNESS OFBREATH(COUGH). 18 g 11   No current facility-administered medications for this visit.      Physical Examination   Vitals:   09/29/17 1314  BP: (!) 211/83  Pulse: 74  Resp:  20  Temp: 98.4 F (36.9 C)  TempSrc: Oral  SpO2: 98%  Weight: 168 lb 12.8 oz (76.6 kg)  Height: 5\' 6"  (1.676 m)   Body mass index is 27.25 kg/m.  General Alert, O x 3, WD, hard of hearing  Pulmonary Sym exp, good B air movt,  Cardiac RRR, Nl S1, S2,   Vascular Vessel Right Left  Radial Palpable Palpable  Brachial Palpable Palpable  Ulnar Not palpable Not palpable    Musculo- skeletal Grip strength symmetrical; palpable thrill and audible bruit R arm fistula; palpable R radial pulse; resolving small focal firm hematoma mid upper arm without surrounding edema  Neurologic A&O; CN grossly intact      Medical Decision Making   [name] is a 84 y.o. female who presents with ESRD requiring hemodialysis with repeated infiltration of R arm fistula.    Patent fistula with easily palpable thrill and audible bruit; resolving hematoma noted  Patient was offered fistulogram however would prefer to allow kidney center to try using fistula again; if unsuccessful we will schedule the patient for fistulogram  Ok to use fistula tomorrow during dialysis 09/30/17  Reassured patient that fistula surgery is unrelated to R leg "giving out"  She will follow up on an as needed basis   Dagoberto Ligas PA-C [facility] [place]: [phone]

## 2017-09-30 DIAGNOSIS — F0390 Unspecified dementia without behavioral disturbance: Secondary | ICD-10-CM | POA: Diagnosis not present

## 2017-09-30 DIAGNOSIS — N186 End stage renal disease: Secondary | ICD-10-CM | POA: Diagnosis not present

## 2017-09-30 DIAGNOSIS — Z992 Dependence on renal dialysis: Secondary | ICD-10-CM | POA: Diagnosis not present

## 2017-09-30 DIAGNOSIS — Z23 Encounter for immunization: Secondary | ICD-10-CM | POA: Diagnosis not present

## 2017-10-01 DIAGNOSIS — F0390 Unspecified dementia without behavioral disturbance: Secondary | ICD-10-CM | POA: Diagnosis not present

## 2017-10-02 DIAGNOSIS — F0390 Unspecified dementia without behavioral disturbance: Secondary | ICD-10-CM | POA: Diagnosis not present

## 2017-10-03 DIAGNOSIS — Z79899 Other long term (current) drug therapy: Secondary | ICD-10-CM | POA: Diagnosis not present

## 2017-10-03 DIAGNOSIS — E785 Hyperlipidemia, unspecified: Secondary | ICD-10-CM | POA: Diagnosis not present

## 2017-10-03 DIAGNOSIS — F0390 Unspecified dementia without behavioral disturbance: Secondary | ICD-10-CM | POA: Diagnosis not present

## 2017-10-03 DIAGNOSIS — N186 End stage renal disease: Secondary | ICD-10-CM | POA: Diagnosis not present

## 2017-10-03 DIAGNOSIS — Z23 Encounter for immunization: Secondary | ICD-10-CM | POA: Diagnosis not present

## 2017-10-03 DIAGNOSIS — E119 Type 2 diabetes mellitus without complications: Secondary | ICD-10-CM | POA: Diagnosis not present

## 2017-10-03 DIAGNOSIS — Z992 Dependence on renal dialysis: Secondary | ICD-10-CM | POA: Diagnosis not present

## 2017-10-04 DIAGNOSIS — F0390 Unspecified dementia without behavioral disturbance: Secondary | ICD-10-CM | POA: Diagnosis not present

## 2017-10-05 DIAGNOSIS — Z992 Dependence on renal dialysis: Secondary | ICD-10-CM | POA: Diagnosis not present

## 2017-10-05 DIAGNOSIS — Z23 Encounter for immunization: Secondary | ICD-10-CM | POA: Diagnosis not present

## 2017-10-05 DIAGNOSIS — N186 End stage renal disease: Secondary | ICD-10-CM | POA: Diagnosis not present

## 2017-10-05 DIAGNOSIS — F0390 Unspecified dementia without behavioral disturbance: Secondary | ICD-10-CM | POA: Diagnosis not present

## 2017-10-06 DIAGNOSIS — F0390 Unspecified dementia without behavioral disturbance: Secondary | ICD-10-CM | POA: Diagnosis not present

## 2017-10-07 DIAGNOSIS — N186 End stage renal disease: Secondary | ICD-10-CM | POA: Diagnosis not present

## 2017-10-07 DIAGNOSIS — Z992 Dependence on renal dialysis: Secondary | ICD-10-CM | POA: Diagnosis not present

## 2017-10-07 DIAGNOSIS — Z23 Encounter for immunization: Secondary | ICD-10-CM | POA: Diagnosis not present

## 2017-10-07 DIAGNOSIS — F0390 Unspecified dementia without behavioral disturbance: Secondary | ICD-10-CM | POA: Diagnosis not present

## 2017-10-08 DIAGNOSIS — F0390 Unspecified dementia without behavioral disturbance: Secondary | ICD-10-CM | POA: Diagnosis not present

## 2017-10-09 DIAGNOSIS — F0390 Unspecified dementia without behavioral disturbance: Secondary | ICD-10-CM | POA: Diagnosis not present

## 2017-10-10 DIAGNOSIS — N186 End stage renal disease: Secondary | ICD-10-CM | POA: Diagnosis not present

## 2017-10-10 DIAGNOSIS — Z23 Encounter for immunization: Secondary | ICD-10-CM | POA: Diagnosis not present

## 2017-10-10 DIAGNOSIS — Z992 Dependence on renal dialysis: Secondary | ICD-10-CM | POA: Diagnosis not present

## 2017-10-10 DIAGNOSIS — F0390 Unspecified dementia without behavioral disturbance: Secondary | ICD-10-CM | POA: Diagnosis not present

## 2017-10-11 ENCOUNTER — Other Ambulatory Visit (HOSPITAL_COMMUNITY): Payer: Self-pay | Admitting: Nephrology

## 2017-10-11 DIAGNOSIS — F0390 Unspecified dementia without behavioral disturbance: Secondary | ICD-10-CM | POA: Diagnosis not present

## 2017-10-11 DIAGNOSIS — N186 End stage renal disease: Secondary | ICD-10-CM

## 2017-10-12 DIAGNOSIS — N186 End stage renal disease: Secondary | ICD-10-CM | POA: Diagnosis not present

## 2017-10-12 DIAGNOSIS — Z992 Dependence on renal dialysis: Secondary | ICD-10-CM | POA: Diagnosis not present

## 2017-10-12 DIAGNOSIS — F0390 Unspecified dementia without behavioral disturbance: Secondary | ICD-10-CM | POA: Diagnosis not present

## 2017-10-13 ENCOUNTER — Encounter (HOSPITAL_COMMUNITY): Payer: Self-pay

## 2017-10-13 ENCOUNTER — Ambulatory Visit (HOSPITAL_COMMUNITY)
Admission: RE | Admit: 2017-10-13 | Discharge: 2017-10-13 | Disposition: A | Discharge status: Home / Self Care | Payer: Medicare HMO | Source: Ambulatory Visit | Attending: Nephrology | Admitting: Nephrology

## 2017-10-13 DIAGNOSIS — F0390 Unspecified dementia without behavioral disturbance: Secondary | ICD-10-CM | POA: Diagnosis not present

## 2017-10-14 DIAGNOSIS — R269 Unspecified abnormalities of gait and mobility: Secondary | ICD-10-CM | POA: Diagnosis not present

## 2017-10-14 DIAGNOSIS — R296 Repeated falls: Secondary | ICD-10-CM | POA: Diagnosis not present

## 2017-10-14 DIAGNOSIS — F0390 Unspecified dementia without behavioral disturbance: Secondary | ICD-10-CM | POA: Diagnosis not present

## 2017-10-14 DIAGNOSIS — J9610 Chronic respiratory failure, unspecified whether with hypoxia or hypercapnia: Secondary | ICD-10-CM | POA: Diagnosis not present

## 2017-10-14 DIAGNOSIS — I509 Heart failure, unspecified: Secondary | ICD-10-CM | POA: Diagnosis not present

## 2017-10-14 DIAGNOSIS — R0602 Shortness of breath: Secondary | ICD-10-CM | POA: Diagnosis not present

## 2017-10-14 DIAGNOSIS — J449 Chronic obstructive pulmonary disease, unspecified: Secondary | ICD-10-CM | POA: Diagnosis not present

## 2017-10-14 DIAGNOSIS — R2681 Unsteadiness on feet: Secondary | ICD-10-CM | POA: Diagnosis not present

## 2017-10-14 DIAGNOSIS — Z992 Dependence on renal dialysis: Secondary | ICD-10-CM | POA: Diagnosis not present

## 2017-10-14 DIAGNOSIS — I5032 Chronic diastolic (congestive) heart failure: Secondary | ICD-10-CM | POA: Diagnosis not present

## 2017-10-14 DIAGNOSIS — N186 End stage renal disease: Secondary | ICD-10-CM | POA: Diagnosis not present

## 2017-10-15 DIAGNOSIS — F0390 Unspecified dementia without behavioral disturbance: Secondary | ICD-10-CM | POA: Diagnosis not present

## 2017-10-16 DIAGNOSIS — F0390 Unspecified dementia without behavioral disturbance: Secondary | ICD-10-CM | POA: Diagnosis not present

## 2017-10-16 DIAGNOSIS — I10 Essential (primary) hypertension: Secondary | ICD-10-CM | POA: Diagnosis not present

## 2017-10-17 DIAGNOSIS — Z992 Dependence on renal dialysis: Secondary | ICD-10-CM | POA: Diagnosis not present

## 2017-10-17 DIAGNOSIS — N186 End stage renal disease: Secondary | ICD-10-CM | POA: Diagnosis not present

## 2017-10-17 DIAGNOSIS — F0390 Unspecified dementia without behavioral disturbance: Secondary | ICD-10-CM | POA: Diagnosis not present

## 2017-10-18 DIAGNOSIS — F0390 Unspecified dementia without behavioral disturbance: Secondary | ICD-10-CM | POA: Diagnosis not present

## 2017-10-19 DIAGNOSIS — Z992 Dependence on renal dialysis: Secondary | ICD-10-CM | POA: Diagnosis not present

## 2017-10-19 DIAGNOSIS — N186 End stage renal disease: Secondary | ICD-10-CM | POA: Diagnosis not present

## 2017-10-19 DIAGNOSIS — F0390 Unspecified dementia without behavioral disturbance: Secondary | ICD-10-CM | POA: Diagnosis not present

## 2017-10-20 DIAGNOSIS — F0390 Unspecified dementia without behavioral disturbance: Secondary | ICD-10-CM | POA: Diagnosis not present

## 2017-10-21 DIAGNOSIS — F0390 Unspecified dementia without behavioral disturbance: Secondary | ICD-10-CM | POA: Diagnosis not present

## 2017-10-21 DIAGNOSIS — Z992 Dependence on renal dialysis: Secondary | ICD-10-CM | POA: Diagnosis not present

## 2017-10-21 DIAGNOSIS — N186 End stage renal disease: Secondary | ICD-10-CM | POA: Diagnosis not present

## 2017-10-22 DIAGNOSIS — F0390 Unspecified dementia without behavioral disturbance: Secondary | ICD-10-CM | POA: Diagnosis not present

## 2017-10-23 DIAGNOSIS — F0390 Unspecified dementia without behavioral disturbance: Secondary | ICD-10-CM | POA: Diagnosis not present

## 2017-10-24 DIAGNOSIS — F0390 Unspecified dementia without behavioral disturbance: Secondary | ICD-10-CM | POA: Diagnosis not present

## 2017-10-24 DIAGNOSIS — Z992 Dependence on renal dialysis: Secondary | ICD-10-CM | POA: Diagnosis not present

## 2017-10-24 DIAGNOSIS — N186 End stage renal disease: Secondary | ICD-10-CM | POA: Diagnosis not present

## 2017-10-25 DIAGNOSIS — F0390 Unspecified dementia without behavioral disturbance: Secondary | ICD-10-CM | POA: Diagnosis not present

## 2017-10-25 DIAGNOSIS — H34812 Central retinal vein occlusion, left eye, with macular edema: Secondary | ICD-10-CM | POA: Diagnosis not present

## 2017-10-25 DIAGNOSIS — H35373 Puckering of macula, bilateral: Secondary | ICD-10-CM | POA: Diagnosis not present

## 2017-10-25 DIAGNOSIS — E113293 Type 2 diabetes mellitus with mild nonproliferative diabetic retinopathy without macular edema, bilateral: Secondary | ICD-10-CM | POA: Diagnosis not present

## 2017-10-25 DIAGNOSIS — H34232 Retinal artery branch occlusion, left eye: Secondary | ICD-10-CM | POA: Diagnosis not present

## 2017-10-25 LAB — HM DIABETES EYE EXAM

## 2017-10-26 DIAGNOSIS — Z992 Dependence on renal dialysis: Secondary | ICD-10-CM | POA: Diagnosis not present

## 2017-10-26 DIAGNOSIS — F0390 Unspecified dementia without behavioral disturbance: Secondary | ICD-10-CM | POA: Diagnosis not present

## 2017-10-26 DIAGNOSIS — N186 End stage renal disease: Secondary | ICD-10-CM | POA: Diagnosis not present

## 2017-10-27 ENCOUNTER — Telehealth: Payer: Self-pay | Admitting: Family Medicine

## 2017-10-27 DIAGNOSIS — F0390 Unspecified dementia without behavioral disturbance: Secondary | ICD-10-CM | POA: Diagnosis not present

## 2017-10-27 NOTE — Telephone Encounter (Signed)
mucinex 200 q 6 hrs prn cough

## 2017-10-27 NOTE — Telephone Encounter (Signed)
[name] aware of recommendations

## 2017-10-27 NOTE — Telephone Encounter (Signed)
Christy from [facility] in [place] called about pt - she is having some cough, clear drainage and would like an order for some Mucinex?

## 2017-10-28 DIAGNOSIS — Z992 Dependence on renal dialysis: Secondary | ICD-10-CM | POA: Diagnosis not present

## 2017-10-28 DIAGNOSIS — N186 End stage renal disease: Secondary | ICD-10-CM | POA: Diagnosis not present

## 2017-10-28 DIAGNOSIS — F0390 Unspecified dementia without behavioral disturbance: Secondary | ICD-10-CM | POA: Diagnosis not present

## 2017-10-29 DIAGNOSIS — F0390 Unspecified dementia without behavioral disturbance: Secondary | ICD-10-CM | POA: Diagnosis not present

## 2017-10-30 DIAGNOSIS — F0390 Unspecified dementia without behavioral disturbance: Secondary | ICD-10-CM | POA: Diagnosis not present

## 2017-10-31 ENCOUNTER — Ambulatory Visit (HOSPITAL_COMMUNITY): Payer: Medicare HMO

## 2017-10-31 DIAGNOSIS — F0390 Unspecified dementia without behavioral disturbance: Secondary | ICD-10-CM | POA: Diagnosis not present

## 2017-10-31 DIAGNOSIS — Z992 Dependence on renal dialysis: Secondary | ICD-10-CM | POA: Diagnosis not present

## 2017-10-31 DIAGNOSIS — N186 End stage renal disease: Secondary | ICD-10-CM | POA: Diagnosis not present

## 2017-11-01 ENCOUNTER — Encounter (HOSPITAL_COMMUNITY): Payer: Self-pay | Admitting: Student

## 2017-11-01 ENCOUNTER — Ambulatory Visit (HOSPITAL_COMMUNITY)
Admission: RE | Admit: 2017-11-01 | Discharge: 2017-11-01 | Disposition: A | Discharge status: Home / Self Care | Payer: Medicare HMO | Source: Ambulatory Visit | Attending: Nephrology | Admitting: Nephrology

## 2017-11-01 DIAGNOSIS — N186 End stage renal disease: Secondary | ICD-10-CM

## 2017-11-01 DIAGNOSIS — F0390 Unspecified dementia without behavioral disturbance: Secondary | ICD-10-CM | POA: Diagnosis not present

## 2017-11-01 DIAGNOSIS — Z4901 Encounter for fitting and adjustment of extracorporeal dialysis catheter: Secondary | ICD-10-CM | POA: Insufficient documentation

## 2017-11-01 HISTORY — PX: IR REMOVAL TUN CV CATH W/O FL: IMG2289

## 2017-11-01 MED ORDER — LIDOCAINE HCL 1 % IJ SOLN
INTRAMUSCULAR | Status: AC
  Filled 2017-11-01: qty 20

## 2017-11-01 MED ORDER — CHLORHEXIDINE GLUCONATE 4 % EX LIQD
CUTANEOUS | Status: DC | PRN
  Administered 2017-11-01: 1 via TOPICAL

## 2017-11-01 MED ORDER — CHLORHEXIDINE GLUCONATE 4 % EX LIQD
CUTANEOUS | Status: AC
  Filled 2017-11-01: qty 15

## 2017-11-02 DIAGNOSIS — F0390 Unspecified dementia without behavioral disturbance: Secondary | ICD-10-CM | POA: Diagnosis not present

## 2017-11-02 DIAGNOSIS — Z992 Dependence on renal dialysis: Secondary | ICD-10-CM | POA: Diagnosis not present

## 2017-11-02 DIAGNOSIS — N186 End stage renal disease: Secondary | ICD-10-CM | POA: Diagnosis not present

## 2017-11-03 DIAGNOSIS — F0390 Unspecified dementia without behavioral disturbance: Secondary | ICD-10-CM | POA: Diagnosis not present

## 2017-11-04 DIAGNOSIS — Z992 Dependence on renal dialysis: Secondary | ICD-10-CM | POA: Diagnosis not present

## 2017-11-04 DIAGNOSIS — F0390 Unspecified dementia without behavioral disturbance: Secondary | ICD-10-CM | POA: Diagnosis not present

## 2017-11-04 DIAGNOSIS — N186 End stage renal disease: Secondary | ICD-10-CM | POA: Diagnosis not present

## 2017-11-05 DIAGNOSIS — F0390 Unspecified dementia without behavioral disturbance: Secondary | ICD-10-CM | POA: Diagnosis not present

## 2017-11-06 DIAGNOSIS — F0390 Unspecified dementia without behavioral disturbance: Secondary | ICD-10-CM | POA: Diagnosis not present

## 2017-11-07 DIAGNOSIS — N186 End stage renal disease: Secondary | ICD-10-CM | POA: Diagnosis not present

## 2017-11-07 DIAGNOSIS — Z992 Dependence on renal dialysis: Secondary | ICD-10-CM | POA: Diagnosis not present

## 2017-11-07 DIAGNOSIS — F0390 Unspecified dementia without behavioral disturbance: Secondary | ICD-10-CM | POA: Diagnosis not present

## 2017-11-08 DIAGNOSIS — F0390 Unspecified dementia without behavioral disturbance: Secondary | ICD-10-CM | POA: Diagnosis not present

## 2017-11-09 DIAGNOSIS — N186 End stage renal disease: Secondary | ICD-10-CM | POA: Diagnosis not present

## 2017-11-09 DIAGNOSIS — F0390 Unspecified dementia without behavioral disturbance: Secondary | ICD-10-CM | POA: Diagnosis not present

## 2017-11-09 DIAGNOSIS — Z992 Dependence on renal dialysis: Secondary | ICD-10-CM | POA: Diagnosis not present

## 2017-11-10 DIAGNOSIS — Z992 Dependence on renal dialysis: Secondary | ICD-10-CM | POA: Diagnosis not present

## 2017-11-10 DIAGNOSIS — F0390 Unspecified dementia without behavioral disturbance: Secondary | ICD-10-CM | POA: Diagnosis not present

## 2017-11-10 DIAGNOSIS — N186 End stage renal disease: Secondary | ICD-10-CM | POA: Diagnosis not present

## 2017-11-11 DIAGNOSIS — Z992 Dependence on renal dialysis: Secondary | ICD-10-CM | POA: Diagnosis not present

## 2017-11-11 DIAGNOSIS — N186 End stage renal disease: Secondary | ICD-10-CM | POA: Diagnosis not present

## 2017-11-11 DIAGNOSIS — Z7689 Persons encountering health services in other specified circumstances: Secondary | ICD-10-CM | POA: Diagnosis not present

## 2017-11-14 DIAGNOSIS — I5032 Chronic diastolic (congestive) heart failure: Secondary | ICD-10-CM | POA: Diagnosis not present

## 2017-11-14 DIAGNOSIS — J9610 Chronic respiratory failure, unspecified whether with hypoxia or hypercapnia: Secondary | ICD-10-CM | POA: Diagnosis not present

## 2017-11-14 DIAGNOSIS — Z7689 Persons encountering health services in other specified circumstances: Secondary | ICD-10-CM | POA: Diagnosis not present

## 2017-11-14 DIAGNOSIS — R269 Unspecified abnormalities of gait and mobility: Secondary | ICD-10-CM | POA: Diagnosis not present

## 2017-11-14 DIAGNOSIS — Z992 Dependence on renal dialysis: Secondary | ICD-10-CM | POA: Diagnosis not present

## 2017-11-14 DIAGNOSIS — J449 Chronic obstructive pulmonary disease, unspecified: Secondary | ICD-10-CM | POA: Diagnosis not present

## 2017-11-14 DIAGNOSIS — R296 Repeated falls: Secondary | ICD-10-CM | POA: Diagnosis not present

## 2017-11-14 DIAGNOSIS — R2681 Unsteadiness on feet: Secondary | ICD-10-CM | POA: Diagnosis not present

## 2017-11-14 DIAGNOSIS — R0602 Shortness of breath: Secondary | ICD-10-CM | POA: Diagnosis not present

## 2017-11-14 DIAGNOSIS — N186 End stage renal disease: Secondary | ICD-10-CM | POA: Diagnosis not present

## 2017-11-14 DIAGNOSIS — I509 Heart failure, unspecified: Secondary | ICD-10-CM | POA: Diagnosis not present

## 2017-11-16 DIAGNOSIS — Z992 Dependence on renal dialysis: Secondary | ICD-10-CM | POA: Diagnosis not present

## 2017-11-16 DIAGNOSIS — N186 End stage renal disease: Secondary | ICD-10-CM | POA: Diagnosis not present

## 2017-11-16 DIAGNOSIS — Z7689 Persons encountering health services in other specified circumstances: Secondary | ICD-10-CM | POA: Diagnosis not present

## 2017-11-18 DIAGNOSIS — Z992 Dependence on renal dialysis: Secondary | ICD-10-CM | POA: Diagnosis not present

## 2017-11-18 DIAGNOSIS — N186 End stage renal disease: Secondary | ICD-10-CM | POA: Diagnosis not present

## 2017-11-18 DIAGNOSIS — Z7689 Persons encountering health services in other specified circumstances: Secondary | ICD-10-CM | POA: Diagnosis not present

## 2017-11-21 DIAGNOSIS — N186 End stage renal disease: Secondary | ICD-10-CM | POA: Diagnosis not present

## 2017-11-21 DIAGNOSIS — Z992 Dependence on renal dialysis: Secondary | ICD-10-CM | POA: Diagnosis not present

## 2017-11-21 DIAGNOSIS — Z7689 Persons encountering health services in other specified circumstances: Secondary | ICD-10-CM | POA: Diagnosis not present

## 2017-11-23 DIAGNOSIS — Z992 Dependence on renal dialysis: Secondary | ICD-10-CM | POA: Diagnosis not present

## 2017-11-23 DIAGNOSIS — Z7689 Persons encountering health services in other specified circumstances: Secondary | ICD-10-CM | POA: Diagnosis not present

## 2017-11-23 DIAGNOSIS — N186 End stage renal disease: Secondary | ICD-10-CM | POA: Diagnosis not present

## 2017-11-25 DIAGNOSIS — N186 End stage renal disease: Secondary | ICD-10-CM | POA: Diagnosis not present

## 2017-11-25 DIAGNOSIS — Z992 Dependence on renal dialysis: Secondary | ICD-10-CM | POA: Diagnosis not present

## 2017-11-25 DIAGNOSIS — Z7689 Persons encountering health services in other specified circumstances: Secondary | ICD-10-CM | POA: Diagnosis not present

## 2017-11-28 DIAGNOSIS — Z7689 Persons encountering health services in other specified circumstances: Secondary | ICD-10-CM | POA: Diagnosis not present

## 2017-11-28 DIAGNOSIS — N186 End stage renal disease: Secondary | ICD-10-CM | POA: Diagnosis not present

## 2017-11-28 DIAGNOSIS — Z992 Dependence on renal dialysis: Secondary | ICD-10-CM | POA: Diagnosis not present

## 2017-11-30 DIAGNOSIS — Z7689 Persons encountering health services in other specified circumstances: Secondary | ICD-10-CM | POA: Diagnosis not present

## 2017-11-30 DIAGNOSIS — N186 End stage renal disease: Secondary | ICD-10-CM | POA: Diagnosis not present

## 2017-11-30 DIAGNOSIS — Z992 Dependence on renal dialysis: Secondary | ICD-10-CM | POA: Diagnosis not present

## 2017-12-02 DIAGNOSIS — N186 End stage renal disease: Secondary | ICD-10-CM | POA: Diagnosis not present

## 2017-12-02 DIAGNOSIS — Z992 Dependence on renal dialysis: Secondary | ICD-10-CM | POA: Diagnosis not present

## 2017-12-02 DIAGNOSIS — Z7689 Persons encountering health services in other specified circumstances: Secondary | ICD-10-CM | POA: Diagnosis not present

## 2017-12-05 DIAGNOSIS — Z992 Dependence on renal dialysis: Secondary | ICD-10-CM | POA: Diagnosis not present

## 2017-12-05 DIAGNOSIS — N186 End stage renal disease: Secondary | ICD-10-CM | POA: Diagnosis not present

## 2017-12-05 DIAGNOSIS — Z7689 Persons encountering health services in other specified circumstances: Secondary | ICD-10-CM | POA: Diagnosis not present

## 2017-12-07 DIAGNOSIS — Z7689 Persons encountering health services in other specified circumstances: Secondary | ICD-10-CM | POA: Diagnosis not present

## 2017-12-07 DIAGNOSIS — N186 End stage renal disease: Secondary | ICD-10-CM | POA: Diagnosis not present

## 2017-12-07 DIAGNOSIS — E1051 Type 1 diabetes mellitus with diabetic peripheral angiopathy without gangrene: Secondary | ICD-10-CM | POA: Diagnosis not present

## 2017-12-07 DIAGNOSIS — Z992 Dependence on renal dialysis: Secondary | ICD-10-CM | POA: Diagnosis not present

## 2017-12-07 DIAGNOSIS — L84 Corns and callosities: Secondary | ICD-10-CM | POA: Diagnosis not present

## 2017-12-09 DIAGNOSIS — N186 End stage renal disease: Secondary | ICD-10-CM | POA: Diagnosis not present

## 2017-12-09 DIAGNOSIS — Z7689 Persons encountering health services in other specified circumstances: Secondary | ICD-10-CM | POA: Diagnosis not present

## 2017-12-09 DIAGNOSIS — Z992 Dependence on renal dialysis: Secondary | ICD-10-CM | POA: Diagnosis not present

## 2017-12-10 DIAGNOSIS — N186 End stage renal disease: Secondary | ICD-10-CM | POA: Diagnosis not present

## 2017-12-10 DIAGNOSIS — Z992 Dependence on renal dialysis: Secondary | ICD-10-CM | POA: Diagnosis not present

## 2017-12-11 DIAGNOSIS — F0390 Unspecified dementia without behavioral disturbance: Secondary | ICD-10-CM | POA: Diagnosis not present

## 2017-12-12 DIAGNOSIS — N186 End stage renal disease: Secondary | ICD-10-CM | POA: Diagnosis not present

## 2017-12-12 DIAGNOSIS — Z992 Dependence on renal dialysis: Secondary | ICD-10-CM | POA: Diagnosis not present

## 2017-12-12 DIAGNOSIS — Z7689 Persons encountering health services in other specified circumstances: Secondary | ICD-10-CM | POA: Diagnosis not present

## 2017-12-12 DIAGNOSIS — F0390 Unspecified dementia without behavioral disturbance: Secondary | ICD-10-CM | POA: Diagnosis not present

## 2017-12-13 DIAGNOSIS — F0390 Unspecified dementia without behavioral disturbance: Secondary | ICD-10-CM | POA: Diagnosis not present

## 2017-12-14 DIAGNOSIS — N186 End stage renal disease: Secondary | ICD-10-CM | POA: Diagnosis not present

## 2017-12-14 DIAGNOSIS — J449 Chronic obstructive pulmonary disease, unspecified: Secondary | ICD-10-CM | POA: Diagnosis not present

## 2017-12-14 DIAGNOSIS — R296 Repeated falls: Secondary | ICD-10-CM | POA: Diagnosis not present

## 2017-12-14 DIAGNOSIS — Z7689 Persons encountering health services in other specified circumstances: Secondary | ICD-10-CM | POA: Diagnosis not present

## 2017-12-14 DIAGNOSIS — I5032 Chronic diastolic (congestive) heart failure: Secondary | ICD-10-CM | POA: Diagnosis not present

## 2017-12-14 DIAGNOSIS — R2681 Unsteadiness on feet: Secondary | ICD-10-CM | POA: Diagnosis not present

## 2017-12-14 DIAGNOSIS — F0390 Unspecified dementia without behavioral disturbance: Secondary | ICD-10-CM | POA: Diagnosis not present

## 2017-12-14 DIAGNOSIS — R0602 Shortness of breath: Secondary | ICD-10-CM | POA: Diagnosis not present

## 2017-12-14 DIAGNOSIS — I509 Heart failure, unspecified: Secondary | ICD-10-CM | POA: Diagnosis not present

## 2017-12-14 DIAGNOSIS — J9610 Chronic respiratory failure, unspecified whether with hypoxia or hypercapnia: Secondary | ICD-10-CM | POA: Diagnosis not present

## 2017-12-14 DIAGNOSIS — Z992 Dependence on renal dialysis: Secondary | ICD-10-CM | POA: Diagnosis not present

## 2017-12-14 DIAGNOSIS — R269 Unspecified abnormalities of gait and mobility: Secondary | ICD-10-CM | POA: Diagnosis not present

## 2017-12-15 DIAGNOSIS — F0390 Unspecified dementia without behavioral disturbance: Secondary | ICD-10-CM | POA: Diagnosis not present

## 2017-12-16 DIAGNOSIS — N186 End stage renal disease: Secondary | ICD-10-CM | POA: Diagnosis not present

## 2017-12-16 DIAGNOSIS — F0390 Unspecified dementia without behavioral disturbance: Secondary | ICD-10-CM | POA: Diagnosis not present

## 2017-12-16 DIAGNOSIS — Z992 Dependence on renal dialysis: Secondary | ICD-10-CM | POA: Diagnosis not present

## 2017-12-16 DIAGNOSIS — Z7689 Persons encountering health services in other specified circumstances: Secondary | ICD-10-CM | POA: Diagnosis not present

## 2017-12-17 DIAGNOSIS — F0390 Unspecified dementia without behavioral disturbance: Secondary | ICD-10-CM | POA: Diagnosis not present

## 2017-12-18 DIAGNOSIS — F0390 Unspecified dementia without behavioral disturbance: Secondary | ICD-10-CM | POA: Diagnosis not present

## 2017-12-19 ENCOUNTER — Other Ambulatory Visit: Payer: Self-pay | Admitting: *Deleted

## 2017-12-19 DIAGNOSIS — F0390 Unspecified dementia without behavioral disturbance: Secondary | ICD-10-CM | POA: Diagnosis not present

## 2017-12-19 DIAGNOSIS — Z992 Dependence on renal dialysis: Secondary | ICD-10-CM | POA: Diagnosis not present

## 2017-12-19 DIAGNOSIS — Z7689 Persons encountering health services in other specified circumstances: Secondary | ICD-10-CM | POA: Diagnosis not present

## 2017-12-19 DIAGNOSIS — N186 End stage renal disease: Secondary | ICD-10-CM | POA: Diagnosis not present

## 2017-12-19 MED ORDER — NYSTATIN 100000 UNIT/GM EX POWD: Freq: Four times a day (QID) | CUTANEOUS | 11 refills | Status: AC | PRN

## 2017-12-20 ENCOUNTER — Other Ambulatory Visit: Payer: Self-pay

## 2017-12-20 ENCOUNTER — Encounter: Payer: Self-pay | Admitting: Family Medicine

## 2017-12-20 ENCOUNTER — Ambulatory Visit (INDEPENDENT_AMBULATORY_CARE_PROVIDER_SITE_OTHER): Payer: Medicare HMO | Admitting: Family Medicine

## 2017-12-20 VITALS — BP 148/86 | HR 68 | Temp 98.3°F | Resp 18 | Ht 66.0 in | Wt 167.2 lb

## 2017-12-20 DIAGNOSIS — F419 Anxiety disorder, unspecified: Secondary | ICD-10-CM | POA: Diagnosis not present

## 2017-12-20 DIAGNOSIS — J9611 Chronic respiratory failure with hypoxia: Secondary | ICD-10-CM | POA: Diagnosis not present

## 2017-12-20 DIAGNOSIS — F3341 Major depressive disorder, recurrent, in partial remission: Secondary | ICD-10-CM | POA: Diagnosis not present

## 2017-12-20 DIAGNOSIS — Z0000 Encounter for general adult medical examination without abnormal findings: Secondary | ICD-10-CM

## 2017-12-20 DIAGNOSIS — F0390 Unspecified dementia without behavioral disturbance: Secondary | ICD-10-CM | POA: Diagnosis not present

## 2017-12-20 DIAGNOSIS — N186 End stage renal disease: Secondary | ICD-10-CM | POA: Diagnosis not present

## 2017-12-20 DIAGNOSIS — Z992 Dependence on renal dialysis: Secondary | ICD-10-CM

## 2017-12-20 DIAGNOSIS — H9193 Unspecified hearing loss, bilateral: Secondary | ICD-10-CM | POA: Diagnosis not present

## 2017-12-20 DIAGNOSIS — I5032 Chronic diastolic (congestive) heart failure: Secondary | ICD-10-CM

## 2017-12-20 DIAGNOSIS — H906 Mixed conductive and sensorineural hearing loss, bilateral: Secondary | ICD-10-CM

## 2017-12-20 DIAGNOSIS — D631 Anemia in chronic kidney disease: Secondary | ICD-10-CM

## 2017-12-20 DIAGNOSIS — Z9181 History of falling: Secondary | ICD-10-CM | POA: Diagnosis not present

## 2017-12-20 NOTE — Patient Instructions (Signed)
Give the albuterol Referral to audiology F/U 6 months

## 2017-12-20 NOTE — Progress Notes (Signed)
Subjective:   Patient presents for Medicare Annual/Subsequent preventive examination.   Review Past Medical/Family/Social: per emr  Resides in [facility]  She is on ESRD three days a week No recent ER visits or hospital admissions    She wants to go get a new hearing aide, she stopped using a few years ago, but it was bothering her oxygen around her ear   Risk Factors  Current exercise habits:   In ALF, seated exercise  Dietary issues discussed: on Liberlized renal diet   Cardiac risk factors: Heart failure, HTN, DM, ESRD  Depression Screen  (Note: if answer to either of the following is "Yes", a more complete depression screening is indicated)  Over the past two weeks, have you felt down, depressed or hopeless? No Over the past two weeks, have you felt little interest or pleasure in doing things? No Have you lost interest or pleasure in daily life? No Do you often feel hopeless? No Do you cry easily over simple problems? No   Activities of Daily Living  In your present state of health, do you have any difficulty performing the following activities?:  Driving? No  Managing money? No  Feeding yourself? No  Getting from bed to chair? No  Climbing a flight of stairs? No  Preparing food and eating?: No  Bathing or showering? No  Getting dressed: No  Getting to the toilet? No  Using the toilet:No  Moving around from place to place: No  In the past year have you fallen or had a near fall?:No  Are you sexually active? No  Do you have more than one partner? No   Hearing Difficulties: Yes - Worsening  Do you often ask people to speak up or repeat themselves? Yes  Do you experience ringing or noises in your ears? No Do you have difficulty understanding soft or whispered voices? Yes  Do you feel that you have a problem with memory? Yes Do you often misplace items? Yes  Do you feel safe at home? Yes  Cognitive Testing  Alert? Yes Normal Appearance?Yes  Oriented to  person? Yes Place? Yes  Time? Yes  Recall of three objects? Yes  Can perform simple calculations? Yes  Displays appropriate judgment?Yes  Can read the correct time from a watch face?Yes   List the Names of Other Physician/Practitioners you currently use:   Nephrology Vascular surgery- for dialysis access  Podiatry at the facility Retina specialist- Dr. Erin Fulling   Screening Tests / Date Colonoscopy    Maxed age                  Zostavax  Mammogram  Maxed age  Influenza Vaccine  UTD Pneumonia UTD Tetanus/tdap  ROS: GEN- denies fatigue, fever, weight loss,weakness, recent illness HEENT- denies eye drainage, change in vision, nasal discharge, CVS- denies chest pain, palpitations RESP- denies SOB, cough, wheeze ABD- denies N/V, change in stools, abd pain GU- denies dysuria, hematuria, dribbling, incontinence MSK- + joint pain, muscle aches, injury Neuro- denies headache, dizziness, syncope, seizure activity   Physical: GEN- NAD, alert and oriented x3 HEENT- PERRL, EOMI, non injected sclera, pink conjunctiva, MMM, oropharynx clear, Tm clear bilat no effusion  Neck- Supple, no thryomegaly CVS- RRR, 3/6 systolic murmur, fistula RUE RESP- few scattered wheeze, normal WOB 3 L oxygen  ABD-NABS,soft,NT,ND EXT- No edema/compressiom hose  Pulses- Radial  2+     Assessment:    Annual wellness medicare exam   Plan:    During  the course of the visit the patient was educated and counseled about appropriate screening and preventive services including:   Resides in ALF, daughter involved , no change to medications   Prevention- she has maxed the age for scopes/mammogram  ERSD- on dialysis with Anemia of chronic renal disease   Code Status- Full code, discussed in detail    Hearing loss- needs hearing aide, referral to audiology    Depression/ Fall/ Audit C screening- Negative    Chronic heart failure- compensated with dialysis  COPD- she has some  wheezing,will have her use Albuterol today, no cough or congestion associated   Chronic respiratory failure- on oxygen therapy   MDD/Anxiety- maintained on celexa, remeron for sleep and appetite ,weight is up   Hypothyroidism- on synthroid , normal TSH in June         Diet review for nutrition referral? Yes ____ Not Indicated __x__  Patient Instructions (the written plan) was given to the patient.  Medicare Attestation  I have personally reviewed:  The patient's medical and social history  Their use of alcohol, tobacco or illicit drugs  Their current medications and supplements  The patient's functional ability including ADLs,fall risks, home safety risks, cognitive, and hearing and visual impairment  Diet and physical activities  Evidence for depression or mood disorders  The patient's weight, height, BMI, and visual acuity have been recorded in the chart. I have made referrals, counseling, and provided education to the patient based on review of the above and I have provided the patient with a written personalized care plan for preventive services.

## 2017-12-21 DIAGNOSIS — Z992 Dependence on renal dialysis: Secondary | ICD-10-CM | POA: Diagnosis not present

## 2017-12-21 DIAGNOSIS — Z7689 Persons encountering health services in other specified circumstances: Secondary | ICD-10-CM | POA: Diagnosis not present

## 2017-12-21 DIAGNOSIS — N186 End stage renal disease: Secondary | ICD-10-CM | POA: Diagnosis not present

## 2017-12-21 DIAGNOSIS — F0390 Unspecified dementia without behavioral disturbance: Secondary | ICD-10-CM | POA: Diagnosis not present

## 2017-12-22 DIAGNOSIS — F0390 Unspecified dementia without behavioral disturbance: Secondary | ICD-10-CM | POA: Diagnosis not present

## 2017-12-23 DIAGNOSIS — N186 End stage renal disease: Secondary | ICD-10-CM | POA: Diagnosis not present

## 2017-12-23 DIAGNOSIS — F0390 Unspecified dementia without behavioral disturbance: Secondary | ICD-10-CM | POA: Diagnosis not present

## 2017-12-23 DIAGNOSIS — Z7689 Persons encountering health services in other specified circumstances: Secondary | ICD-10-CM | POA: Diagnosis not present

## 2017-12-23 DIAGNOSIS — Z992 Dependence on renal dialysis: Secondary | ICD-10-CM | POA: Diagnosis not present

## 2017-12-24 DIAGNOSIS — F0390 Unspecified dementia without behavioral disturbance: Secondary | ICD-10-CM | POA: Diagnosis not present

## 2017-12-25 DIAGNOSIS — F0390 Unspecified dementia without behavioral disturbance: Secondary | ICD-10-CM | POA: Diagnosis not present

## 2017-12-26 DIAGNOSIS — F0390 Unspecified dementia without behavioral disturbance: Secondary | ICD-10-CM | POA: Diagnosis not present

## 2017-12-26 DIAGNOSIS — N186 End stage renal disease: Secondary | ICD-10-CM | POA: Diagnosis not present

## 2017-12-26 DIAGNOSIS — Z7689 Persons encountering health services in other specified circumstances: Secondary | ICD-10-CM | POA: Diagnosis not present

## 2017-12-26 DIAGNOSIS — Z992 Dependence on renal dialysis: Secondary | ICD-10-CM | POA: Diagnosis not present

## 2017-12-27 DIAGNOSIS — F0390 Unspecified dementia without behavioral disturbance: Secondary | ICD-10-CM | POA: Diagnosis not present

## 2017-12-28 DIAGNOSIS — Z992 Dependence on renal dialysis: Secondary | ICD-10-CM | POA: Diagnosis not present

## 2017-12-28 DIAGNOSIS — F0390 Unspecified dementia without behavioral disturbance: Secondary | ICD-10-CM | POA: Diagnosis not present

## 2017-12-28 DIAGNOSIS — N186 End stage renal disease: Secondary | ICD-10-CM | POA: Diagnosis not present

## 2017-12-28 DIAGNOSIS — Z7689 Persons encountering health services in other specified circumstances: Secondary | ICD-10-CM | POA: Diagnosis not present

## 2017-12-29 DIAGNOSIS — F0390 Unspecified dementia without behavioral disturbance: Secondary | ICD-10-CM | POA: Diagnosis not present

## 2017-12-30 DIAGNOSIS — Z7689 Persons encountering health services in other specified circumstances: Secondary | ICD-10-CM | POA: Diagnosis not present

## 2017-12-30 DIAGNOSIS — N186 End stage renal disease: Secondary | ICD-10-CM | POA: Diagnosis not present

## 2017-12-30 DIAGNOSIS — F0390 Unspecified dementia without behavioral disturbance: Secondary | ICD-10-CM | POA: Diagnosis not present

## 2017-12-30 DIAGNOSIS — Z992 Dependence on renal dialysis: Secondary | ICD-10-CM | POA: Diagnosis not present

## 2017-12-31 DIAGNOSIS — F0390 Unspecified dementia without behavioral disturbance: Secondary | ICD-10-CM | POA: Diagnosis not present

## 2018-01-01 DIAGNOSIS — F0390 Unspecified dementia without behavioral disturbance: Secondary | ICD-10-CM | POA: Diagnosis not present

## 2018-01-01 DIAGNOSIS — Z7689 Persons encountering health services in other specified circumstances: Secondary | ICD-10-CM | POA: Diagnosis not present

## 2018-01-01 DIAGNOSIS — N186 End stage renal disease: Secondary | ICD-10-CM | POA: Diagnosis not present

## 2018-01-01 DIAGNOSIS — Z992 Dependence on renal dialysis: Secondary | ICD-10-CM | POA: Diagnosis not present

## 2018-01-02 DIAGNOSIS — F0390 Unspecified dementia without behavioral disturbance: Secondary | ICD-10-CM | POA: Diagnosis not present

## 2018-01-03 DIAGNOSIS — Z7689 Persons encountering health services in other specified circumstances: Secondary | ICD-10-CM | POA: Diagnosis not present

## 2018-01-03 DIAGNOSIS — F0390 Unspecified dementia without behavioral disturbance: Secondary | ICD-10-CM | POA: Diagnosis not present

## 2018-01-03 DIAGNOSIS — Z992 Dependence on renal dialysis: Secondary | ICD-10-CM | POA: Diagnosis not present

## 2018-01-03 DIAGNOSIS — N186 End stage renal disease: Secondary | ICD-10-CM | POA: Diagnosis not present

## 2018-01-04 DIAGNOSIS — F0390 Unspecified dementia without behavioral disturbance: Secondary | ICD-10-CM | POA: Diagnosis not present

## 2018-01-05 DIAGNOSIS — F0390 Unspecified dementia without behavioral disturbance: Secondary | ICD-10-CM | POA: Diagnosis not present

## 2018-01-06 DIAGNOSIS — Z79899 Other long term (current) drug therapy: Secondary | ICD-10-CM | POA: Diagnosis not present

## 2018-01-06 DIAGNOSIS — Z992 Dependence on renal dialysis: Secondary | ICD-10-CM | POA: Diagnosis not present

## 2018-01-06 DIAGNOSIS — E785 Hyperlipidemia, unspecified: Secondary | ICD-10-CM | POA: Diagnosis not present

## 2018-01-06 DIAGNOSIS — Z7689 Persons encountering health services in other specified circumstances: Secondary | ICD-10-CM | POA: Diagnosis not present

## 2018-01-06 DIAGNOSIS — E119 Type 2 diabetes mellitus without complications: Secondary | ICD-10-CM | POA: Diagnosis not present

## 2018-01-06 DIAGNOSIS — N186 End stage renal disease: Secondary | ICD-10-CM | POA: Diagnosis not present

## 2018-01-06 DIAGNOSIS — F0390 Unspecified dementia without behavioral disturbance: Secondary | ICD-10-CM | POA: Diagnosis not present

## 2018-01-07 DIAGNOSIS — F0390 Unspecified dementia without behavioral disturbance: Secondary | ICD-10-CM | POA: Diagnosis not present

## 2018-01-08 DIAGNOSIS — F0390 Unspecified dementia without behavioral disturbance: Secondary | ICD-10-CM | POA: Diagnosis not present

## 2018-01-09 DIAGNOSIS — F0390 Unspecified dementia without behavioral disturbance: Secondary | ICD-10-CM | POA: Diagnosis not present

## 2018-01-09 DIAGNOSIS — N186 End stage renal disease: Secondary | ICD-10-CM | POA: Diagnosis not present

## 2018-01-09 DIAGNOSIS — Z7689 Persons encountering health services in other specified circumstances: Secondary | ICD-10-CM | POA: Diagnosis not present

## 2018-01-09 DIAGNOSIS — Z992 Dependence on renal dialysis: Secondary | ICD-10-CM | POA: Diagnosis not present

## 2018-01-10 DIAGNOSIS — F0390 Unspecified dementia without behavioral disturbance: Secondary | ICD-10-CM | POA: Diagnosis not present

## 2018-01-10 DIAGNOSIS — Z992 Dependence on renal dialysis: Secondary | ICD-10-CM | POA: Diagnosis not present

## 2018-01-10 DIAGNOSIS — N186 End stage renal disease: Secondary | ICD-10-CM | POA: Diagnosis not present

## 2018-01-11 DIAGNOSIS — Z992 Dependence on renal dialysis: Secondary | ICD-10-CM | POA: Diagnosis not present

## 2018-01-11 DIAGNOSIS — F0390 Unspecified dementia without behavioral disturbance: Secondary | ICD-10-CM | POA: Diagnosis not present

## 2018-01-11 DIAGNOSIS — N186 End stage renal disease: Secondary | ICD-10-CM | POA: Diagnosis not present

## 2018-01-12 DIAGNOSIS — F0390 Unspecified dementia without behavioral disturbance: Secondary | ICD-10-CM | POA: Diagnosis not present

## 2018-01-13 DIAGNOSIS — F0390 Unspecified dementia without behavioral disturbance: Secondary | ICD-10-CM | POA: Diagnosis not present

## 2018-01-13 DIAGNOSIS — Z992 Dependence on renal dialysis: Secondary | ICD-10-CM | POA: Diagnosis not present

## 2018-01-13 DIAGNOSIS — N186 End stage renal disease: Secondary | ICD-10-CM | POA: Diagnosis not present

## 2018-01-14 DIAGNOSIS — F0390 Unspecified dementia without behavioral disturbance: Secondary | ICD-10-CM | POA: Diagnosis not present

## 2018-01-14 DIAGNOSIS — R2681 Unsteadiness on feet: Secondary | ICD-10-CM | POA: Diagnosis not present

## 2018-01-14 DIAGNOSIS — I5032 Chronic diastolic (congestive) heart failure: Secondary | ICD-10-CM | POA: Diagnosis not present

## 2018-01-14 DIAGNOSIS — I509 Heart failure, unspecified: Secondary | ICD-10-CM | POA: Diagnosis not present

## 2018-01-14 DIAGNOSIS — J9610 Chronic respiratory failure, unspecified whether with hypoxia or hypercapnia: Secondary | ICD-10-CM | POA: Diagnosis not present

## 2018-01-14 DIAGNOSIS — R269 Unspecified abnormalities of gait and mobility: Secondary | ICD-10-CM | POA: Diagnosis not present

## 2018-01-14 DIAGNOSIS — R296 Repeated falls: Secondary | ICD-10-CM | POA: Diagnosis not present

## 2018-01-14 DIAGNOSIS — J449 Chronic obstructive pulmonary disease, unspecified: Secondary | ICD-10-CM | POA: Diagnosis not present

## 2018-01-14 DIAGNOSIS — R0602 Shortness of breath: Secondary | ICD-10-CM | POA: Diagnosis not present

## 2018-01-15 DIAGNOSIS — R296 Repeated falls: Secondary | ICD-10-CM | POA: Diagnosis not present

## 2018-01-15 DIAGNOSIS — F0390 Unspecified dementia without behavioral disturbance: Secondary | ICD-10-CM | POA: Diagnosis not present

## 2018-01-16 DIAGNOSIS — N186 End stage renal disease: Secondary | ICD-10-CM | POA: Diagnosis not present

## 2018-01-16 DIAGNOSIS — F0390 Unspecified dementia without behavioral disturbance: Secondary | ICD-10-CM | POA: Diagnosis not present

## 2018-01-16 DIAGNOSIS — Z992 Dependence on renal dialysis: Secondary | ICD-10-CM | POA: Diagnosis not present

## 2018-01-17 DIAGNOSIS — F0390 Unspecified dementia without behavioral disturbance: Secondary | ICD-10-CM | POA: Diagnosis not present

## 2018-01-18 ENCOUNTER — Encounter: Payer: Self-pay | Admitting: *Deleted

## 2018-01-18 DIAGNOSIS — F0390 Unspecified dementia without behavioral disturbance: Secondary | ICD-10-CM | POA: Diagnosis not present

## 2018-01-18 DIAGNOSIS — N186 End stage renal disease: Secondary | ICD-10-CM | POA: Diagnosis not present

## 2018-01-18 DIAGNOSIS — Z992 Dependence on renal dialysis: Secondary | ICD-10-CM | POA: Diagnosis not present

## 2018-01-19 ENCOUNTER — Telehealth: Payer: Self-pay | Admitting: *Deleted

## 2018-01-19 ENCOUNTER — Encounter: Payer: Self-pay | Admitting: *Deleted

## 2018-01-19 DIAGNOSIS — N6320 Unspecified lump in the left breast, unspecified quadrant: Secondary | ICD-10-CM

## 2018-01-19 DIAGNOSIS — F0390 Unspecified dementia without behavioral disturbance: Secondary | ICD-10-CM | POA: Diagnosis not present

## 2018-01-19 NOTE — Telephone Encounter (Signed)
Received call from [place], [place] with facility.   Reports that patient is stating she found sore lump to L breast and is requesting (kindly demanding) mammogram.   Call placed to [name], patient daughter. States that she is aware that with patient age and current health, agressive treatment will likely not be followed even if area is malignant. However, she does agree to wanting Mammogram to be sure.   MD please advise.

## 2018-01-20 ENCOUNTER — Other Ambulatory Visit: Payer: Self-pay | Admitting: Family Medicine

## 2018-01-20 DIAGNOSIS — Z992 Dependence on renal dialysis: Secondary | ICD-10-CM | POA: Diagnosis not present

## 2018-01-20 DIAGNOSIS — N186 End stage renal disease: Secondary | ICD-10-CM | POA: Diagnosis not present

## 2018-01-20 DIAGNOSIS — F0390 Unspecified dementia without behavioral disturbance: Secondary | ICD-10-CM | POA: Diagnosis not present

## 2018-01-20 NOTE — Telephone Encounter (Signed)
Call placed to [place].   Was advised that written order can be faxed to imaging center, but will need to clarify location of mass.   Mammo/US scheduled for Wed 01/25/2018 @ 1:20pm.  Call placed to facility to clarify. Patient is currently on bus trip and will return shortly. Aide will call with position of lump.

## 2018-01-20 NOTE — Telephone Encounter (Signed)
Okay to order diagnostic mammogram left breast mass with Ultrasound [facility]

## 2018-01-21 DIAGNOSIS — F0390 Unspecified dementia without behavioral disturbance: Secondary | ICD-10-CM | POA: Diagnosis not present

## 2018-01-22 DIAGNOSIS — F0390 Unspecified dementia without behavioral disturbance: Secondary | ICD-10-CM | POA: Diagnosis not present

## 2018-01-23 ENCOUNTER — Other Ambulatory Visit: Payer: Self-pay | Admitting: Family Medicine

## 2018-01-23 DIAGNOSIS — F0390 Unspecified dementia without behavioral disturbance: Secondary | ICD-10-CM | POA: Diagnosis not present

## 2018-01-23 DIAGNOSIS — N186 End stage renal disease: Secondary | ICD-10-CM | POA: Diagnosis not present

## 2018-01-23 DIAGNOSIS — Z992 Dependence on renal dialysis: Secondary | ICD-10-CM | POA: Diagnosis not present

## 2018-01-23 NOTE — Telephone Encounter (Signed)
Call placed to facility to inquire about position.   Was advised that lump is in 3 o'clock position.   Order faxed to [facility] diagnostic.

## 2018-01-23 NOTE — Telephone Encounter (Signed)
Call placed to facility to inquire about position of pain/ lump to L breast. LM for Resident Care Coordinator, Alyse Low to return call.

## 2018-01-24 DIAGNOSIS — F0390 Unspecified dementia without behavioral disturbance: Secondary | ICD-10-CM | POA: Diagnosis not present

## 2018-01-25 DIAGNOSIS — F0390 Unspecified dementia without behavioral disturbance: Secondary | ICD-10-CM | POA: Diagnosis not present

## 2018-01-25 DIAGNOSIS — Z992 Dependence on renal dialysis: Secondary | ICD-10-CM | POA: Diagnosis not present

## 2018-01-25 DIAGNOSIS — D242 Benign neoplasm of left breast: Secondary | ICD-10-CM | POA: Diagnosis not present

## 2018-01-25 DIAGNOSIS — N186 End stage renal disease: Secondary | ICD-10-CM | POA: Diagnosis not present

## 2018-01-25 DIAGNOSIS — R928 Other abnormal and inconclusive findings on diagnostic imaging of breast: Secondary | ICD-10-CM | POA: Diagnosis not present

## 2018-01-25 LAB — HM MAMMOGRAPHY

## 2018-01-25 NOTE — Telephone Encounter (Signed)
Noted, okay to order 

## 2018-01-25 NOTE — Telephone Encounter (Signed)
Received call from [facility].   Reports that policy states that since patient has not had mammogram >1 year, an order for B breast diagnostic mammogram is required. States that mammogram has been completed on B sides, and [place] was not required. Patient was made aware and agreeable to B sides.

## 2018-01-26 ENCOUNTER — Encounter: Payer: Self-pay | Admitting: *Deleted

## 2018-01-26 DIAGNOSIS — F0390 Unspecified dementia without behavioral disturbance: Secondary | ICD-10-CM | POA: Diagnosis not present

## 2018-01-27 DIAGNOSIS — Z992 Dependence on renal dialysis: Secondary | ICD-10-CM | POA: Diagnosis not present

## 2018-01-27 DIAGNOSIS — F0390 Unspecified dementia without behavioral disturbance: Secondary | ICD-10-CM | POA: Diagnosis not present

## 2018-01-27 DIAGNOSIS — N186 End stage renal disease: Secondary | ICD-10-CM | POA: Diagnosis not present

## 2018-01-28 DIAGNOSIS — F0390 Unspecified dementia without behavioral disturbance: Secondary | ICD-10-CM | POA: Diagnosis not present

## 2018-01-29 DIAGNOSIS — F0390 Unspecified dementia without behavioral disturbance: Secondary | ICD-10-CM | POA: Diagnosis not present

## 2018-01-30 DIAGNOSIS — Z992 Dependence on renal dialysis: Secondary | ICD-10-CM | POA: Diagnosis not present

## 2018-01-30 DIAGNOSIS — N186 End stage renal disease: Secondary | ICD-10-CM | POA: Diagnosis not present

## 2018-01-30 DIAGNOSIS — F0390 Unspecified dementia without behavioral disturbance: Secondary | ICD-10-CM | POA: Diagnosis not present

## 2018-01-31 DIAGNOSIS — F0390 Unspecified dementia without behavioral disturbance: Secondary | ICD-10-CM | POA: Diagnosis not present

## 2018-02-01 DIAGNOSIS — F0390 Unspecified dementia without behavioral disturbance: Secondary | ICD-10-CM | POA: Diagnosis not present

## 2018-02-01 DIAGNOSIS — N186 End stage renal disease: Secondary | ICD-10-CM | POA: Diagnosis not present

## 2018-02-01 DIAGNOSIS — Z992 Dependence on renal dialysis: Secondary | ICD-10-CM | POA: Diagnosis not present

## 2018-02-02 DIAGNOSIS — F0390 Unspecified dementia without behavioral disturbance: Secondary | ICD-10-CM | POA: Diagnosis not present

## 2018-02-03 DIAGNOSIS — Z992 Dependence on renal dialysis: Secondary | ICD-10-CM | POA: Diagnosis not present

## 2018-02-03 DIAGNOSIS — N186 End stage renal disease: Secondary | ICD-10-CM | POA: Diagnosis not present

## 2018-02-03 DIAGNOSIS — F0390 Unspecified dementia without behavioral disturbance: Secondary | ICD-10-CM | POA: Diagnosis not present

## 2018-02-04 DIAGNOSIS — F0390 Unspecified dementia without behavioral disturbance: Secondary | ICD-10-CM | POA: Diagnosis not present

## 2018-02-05 DIAGNOSIS — F0390 Unspecified dementia without behavioral disturbance: Secondary | ICD-10-CM | POA: Diagnosis not present

## 2018-02-06 DIAGNOSIS — N186 End stage renal disease: Secondary | ICD-10-CM | POA: Diagnosis not present

## 2018-02-06 DIAGNOSIS — F0390 Unspecified dementia without behavioral disturbance: Secondary | ICD-10-CM | POA: Diagnosis not present

## 2018-02-06 DIAGNOSIS — Z992 Dependence on renal dialysis: Secondary | ICD-10-CM | POA: Diagnosis not present

## 2018-02-07 DIAGNOSIS — F0390 Unspecified dementia without behavioral disturbance: Secondary | ICD-10-CM | POA: Diagnosis not present

## 2018-02-08 DIAGNOSIS — F0390 Unspecified dementia without behavioral disturbance: Secondary | ICD-10-CM | POA: Diagnosis not present

## 2018-02-08 DIAGNOSIS — Z992 Dependence on renal dialysis: Secondary | ICD-10-CM | POA: Diagnosis not present

## 2018-02-08 DIAGNOSIS — N186 End stage renal disease: Secondary | ICD-10-CM | POA: Diagnosis not present

## 2018-02-09 DIAGNOSIS — F0390 Unspecified dementia without behavioral disturbance: Secondary | ICD-10-CM | POA: Diagnosis not present

## 2018-02-10 DIAGNOSIS — Z992 Dependence on renal dialysis: Secondary | ICD-10-CM | POA: Diagnosis not present

## 2018-02-10 DIAGNOSIS — N186 End stage renal disease: Secondary | ICD-10-CM | POA: Diagnosis not present

## 2018-02-10 DIAGNOSIS — F0390 Unspecified dementia without behavioral disturbance: Secondary | ICD-10-CM | POA: Diagnosis not present

## 2018-02-11 DIAGNOSIS — F0390 Unspecified dementia without behavioral disturbance: Secondary | ICD-10-CM | POA: Diagnosis not present

## 2018-02-12 DIAGNOSIS — F0390 Unspecified dementia without behavioral disturbance: Secondary | ICD-10-CM | POA: Diagnosis not present

## 2018-02-13 DIAGNOSIS — N186 End stage renal disease: Secondary | ICD-10-CM | POA: Diagnosis not present

## 2018-02-13 DIAGNOSIS — Z992 Dependence on renal dialysis: Secondary | ICD-10-CM | POA: Diagnosis not present

## 2018-02-13 DIAGNOSIS — F0390 Unspecified dementia without behavioral disturbance: Secondary | ICD-10-CM | POA: Diagnosis not present

## 2018-02-14 DIAGNOSIS — H3582 Retinal ischemia: Secondary | ICD-10-CM | POA: Diagnosis not present

## 2018-02-14 DIAGNOSIS — R269 Unspecified abnormalities of gait and mobility: Secondary | ICD-10-CM | POA: Diagnosis not present

## 2018-02-14 DIAGNOSIS — J449 Chronic obstructive pulmonary disease, unspecified: Secondary | ICD-10-CM | POA: Diagnosis not present

## 2018-02-14 DIAGNOSIS — H34232 Retinal artery branch occlusion, left eye: Secondary | ICD-10-CM | POA: Diagnosis not present

## 2018-02-14 DIAGNOSIS — R0602 Shortness of breath: Secondary | ICD-10-CM | POA: Diagnosis not present

## 2018-02-14 DIAGNOSIS — I509 Heart failure, unspecified: Secondary | ICD-10-CM | POA: Diagnosis not present

## 2018-02-14 DIAGNOSIS — R296 Repeated falls: Secondary | ICD-10-CM | POA: Diagnosis not present

## 2018-02-14 DIAGNOSIS — J9610 Chronic respiratory failure, unspecified whether with hypoxia or hypercapnia: Secondary | ICD-10-CM | POA: Diagnosis not present

## 2018-02-14 DIAGNOSIS — R2681 Unsteadiness on feet: Secondary | ICD-10-CM | POA: Diagnosis not present

## 2018-02-14 DIAGNOSIS — I5032 Chronic diastolic (congestive) heart failure: Secondary | ICD-10-CM | POA: Diagnosis not present

## 2018-02-14 DIAGNOSIS — E113293 Type 2 diabetes mellitus with mild nonproliferative diabetic retinopathy without macular edema, bilateral: Secondary | ICD-10-CM | POA: Diagnosis not present

## 2018-02-14 DIAGNOSIS — H34812 Central retinal vein occlusion, left eye, with macular edema: Secondary | ICD-10-CM | POA: Diagnosis not present

## 2018-02-14 DIAGNOSIS — F0390 Unspecified dementia without behavioral disturbance: Secondary | ICD-10-CM | POA: Diagnosis not present

## 2018-02-15 DIAGNOSIS — N186 End stage renal disease: Secondary | ICD-10-CM | POA: Diagnosis not present

## 2018-02-15 DIAGNOSIS — F0390 Unspecified dementia without behavioral disturbance: Secondary | ICD-10-CM | POA: Diagnosis not present

## 2018-02-15 DIAGNOSIS — Z992 Dependence on renal dialysis: Secondary | ICD-10-CM | POA: Diagnosis not present

## 2018-02-16 DIAGNOSIS — F0390 Unspecified dementia without behavioral disturbance: Secondary | ICD-10-CM | POA: Diagnosis not present

## 2018-02-17 DIAGNOSIS — N186 End stage renal disease: Secondary | ICD-10-CM | POA: Diagnosis not present

## 2018-02-17 DIAGNOSIS — F0390 Unspecified dementia without behavioral disturbance: Secondary | ICD-10-CM | POA: Diagnosis not present

## 2018-02-17 DIAGNOSIS — Z992 Dependence on renal dialysis: Secondary | ICD-10-CM | POA: Diagnosis not present

## 2018-02-18 DIAGNOSIS — F0390 Unspecified dementia without behavioral disturbance: Secondary | ICD-10-CM | POA: Diagnosis not present

## 2018-02-19 DIAGNOSIS — F0390 Unspecified dementia without behavioral disturbance: Secondary | ICD-10-CM | POA: Diagnosis not present

## 2018-02-20 DIAGNOSIS — N186 End stage renal disease: Secondary | ICD-10-CM | POA: Diagnosis not present

## 2018-02-20 DIAGNOSIS — Z992 Dependence on renal dialysis: Secondary | ICD-10-CM | POA: Diagnosis not present

## 2018-02-20 DIAGNOSIS — F0390 Unspecified dementia without behavioral disturbance: Secondary | ICD-10-CM | POA: Diagnosis not present

## 2018-02-21 ENCOUNTER — Other Ambulatory Visit: Payer: Self-pay

## 2018-02-21 ENCOUNTER — Ambulatory Visit (INDEPENDENT_AMBULATORY_CARE_PROVIDER_SITE_OTHER): Payer: Medicare HMO | Admitting: Family Medicine

## 2018-02-21 ENCOUNTER — Encounter: Payer: Self-pay | Admitting: Family Medicine

## 2018-02-21 VITALS — BP 148/98 | HR 78 | Temp 99.1°F | Resp 14 | Ht 66.0 in | Wt 168.0 lb

## 2018-02-21 DIAGNOSIS — B373 Candidiasis of vulva and vagina: Secondary | ICD-10-CM | POA: Diagnosis not present

## 2018-02-21 DIAGNOSIS — J9611 Chronic respiratory failure with hypoxia: Secondary | ICD-10-CM

## 2018-02-21 DIAGNOSIS — B3731 Acute candidiasis of vulva and vagina: Secondary | ICD-10-CM

## 2018-02-21 DIAGNOSIS — F0390 Unspecified dementia without behavioral disturbance: Secondary | ICD-10-CM | POA: Diagnosis not present

## 2018-02-21 DIAGNOSIS — M25421 Effusion, right elbow: Secondary | ICD-10-CM | POA: Diagnosis not present

## 2018-02-21 MED ORDER — NYSTATIN 100000 UNIT/GM EX CREA: 1.0000 "application " | TOPICAL_CREAM | Freq: Two times a day (BID) | CUTANEOUS | 0 refills | Status: AC

## 2018-02-21 NOTE — Patient Instructions (Addendum)
Swelling right arm - we will rule out blood clot Use vaginal yeast cream twice a day for 1 week F/U as previous

## 2018-02-21 NOTE — Progress Notes (Signed)
   Subjective:    Patient ID: [name], female    DOB: 1925/06/16, 85 y.o.   MRN: [id number]  Patient presents for R Arm Pain (went to dialysis on Friday and had painin arms from shunt- now arm is swollen and painful); Hot Flashes (x2 days- states that she was oerheated and it didn't go away); and Vaginal Pain (intermittent pain that stopped)   Hot flashes for 1 week, no fever, felt fine, went away on its own. Then she had one night where she couldn't breathe, nurse set with her until it resolved, no inhaler needed, no chest pain.   She has had some vaginal discharge, no current itching or pain, had pain 1 day, that went away. She does not make urine  Friday at HD, something was with the machine or how her graft was hooked up, she had pain on the machine,afterwards her arm swelled up., It is stil swollen and bruised, unable to raise up due to discomfort and it feels tight, states HD physician looked at it, told her it would go away, but that he didn't know what it was??   Review Of Systems:  GEN- denies fatigue, fever, weight loss,weakness, recent illness HEENT- denies eye drainage, change in vision, nasal discharge, CVS- denies chest pain, palpitations RESP- denies SOB, cough, wheeze ABD- denies N/V, change in stools, abd pain GU- denies dysuria, hematuria, dribbling, incontinence MSK- +joint pain, muscle aches, injury Neuro- denies headache, dizziness, syncope, seizure activity       Objective:    BP (!) 148/98   Pulse 78   Temp 99.1 F (37.3 C) (Oral)   Resp 14   Ht 5\' 6"  (1.676 m)   Wt 168 lb (76.2 kg)   LMP 12/24/2010   SpO2 95%   BMI 27.12 kg/m  GEN- NAD, alert and oriented x3 HEENT-wearing sunglasses  pink conjunctiva, MMM, oropharynx clear Neck- Supple, no LAD, CVS- RRR, 3/6 systolic murmur,   RESP-CTAB ABD-NABS,soft,NT,ND GU- atrophic vagina/labia, mild erythema with white discharge between labia, no odor EXT- No edema LE, RUE swelling adjacent to graft In  soft tissue hyperpigmented/bruised appearance, no warmth, NT, decreaed ROM RUE compared to left  Pulses- Radial 2+        Assessment & Plan:      Problem List Items Addressed This Visit      Unprioritized   Chronic respiratory failure ([place]) - Primary (Chronic)    maintained on oxygen Unclear about hot flashes, now resolved She often tries to exert herself a lot and is then winded No overt sign of fluid overload or infection at this time       Other Visit Diagnoses    Swelling of joint of upper arm, right       obtain US of soft tissue concern that she had infiltration of blood similar to IV infiltrating, and it has collected in soft tissue, doubt DVT/ evalute for hematoma   Relevant Orders   [place] RT UPPER EXTREM LTD SOFT TISSUE NON VASCULAR   Vaginal yeast infection       Nystatin cream BID, no vaginal bleeding, does not make urine   Relevant Medications   nystatin cream (MYCOSTATIN)      Note: This dictation was prepared with Dragon dictation along with smaller phrase technology. Any transcriptional errors that result from this process are unintentional.

## 2018-02-21 NOTE — Assessment & Plan Note (Signed)
maintained on oxygen Unclear about hot flashes, now resolved She often tries to exert herself a lot and is then winded No overt sign of fluid overload or infection at this time

## 2018-02-22 DIAGNOSIS — F0390 Unspecified dementia without behavioral disturbance: Secondary | ICD-10-CM | POA: Diagnosis not present

## 2018-02-22 DIAGNOSIS — N186 End stage renal disease: Secondary | ICD-10-CM | POA: Diagnosis not present

## 2018-02-22 DIAGNOSIS — Z992 Dependence on renal dialysis: Secondary | ICD-10-CM | POA: Diagnosis not present

## 2018-02-23 ENCOUNTER — Ambulatory Visit (INDEPENDENT_AMBULATORY_CARE_PROVIDER_SITE_OTHER): Payer: Medicare HMO | Admitting: Otolaryngology

## 2018-02-23 DIAGNOSIS — H903 Sensorineural hearing loss, bilateral: Secondary | ICD-10-CM

## 2018-02-23 DIAGNOSIS — F0390 Unspecified dementia without behavioral disturbance: Secondary | ICD-10-CM | POA: Diagnosis not present

## 2018-02-24 DIAGNOSIS — N186 End stage renal disease: Secondary | ICD-10-CM | POA: Diagnosis not present

## 2018-02-24 DIAGNOSIS — Z992 Dependence on renal dialysis: Secondary | ICD-10-CM | POA: Diagnosis not present

## 2018-02-24 DIAGNOSIS — F0390 Unspecified dementia without behavioral disturbance: Secondary | ICD-10-CM | POA: Diagnosis not present

## 2018-02-25 DIAGNOSIS — F0390 Unspecified dementia without behavioral disturbance: Secondary | ICD-10-CM | POA: Diagnosis not present

## 2018-02-26 DIAGNOSIS — F0390 Unspecified dementia without behavioral disturbance: Secondary | ICD-10-CM | POA: Diagnosis not present

## 2018-02-27 DIAGNOSIS — F0390 Unspecified dementia without behavioral disturbance: Secondary | ICD-10-CM | POA: Diagnosis not present

## 2018-02-27 DIAGNOSIS — Z992 Dependence on renal dialysis: Secondary | ICD-10-CM | POA: Diagnosis not present

## 2018-02-27 DIAGNOSIS — N186 End stage renal disease: Secondary | ICD-10-CM | POA: Diagnosis not present

## 2018-02-28 DIAGNOSIS — F0390 Unspecified dementia without behavioral disturbance: Secondary | ICD-10-CM | POA: Diagnosis not present

## 2018-03-01 ENCOUNTER — Other Ambulatory Visit: Payer: Self-pay | Admitting: Family Medicine

## 2018-03-01 DIAGNOSIS — N186 End stage renal disease: Secondary | ICD-10-CM | POA: Diagnosis not present

## 2018-03-01 DIAGNOSIS — T82898A Other specified complication of vascular prosthetic devices, implants and grafts, initial encounter: Secondary | ICD-10-CM | POA: Diagnosis not present

## 2018-03-01 DIAGNOSIS — M7989 Other specified soft tissue disorders: Secondary | ICD-10-CM | POA: Diagnosis not present

## 2018-03-01 DIAGNOSIS — M25421 Effusion, right elbow: Secondary | ICD-10-CM | POA: Diagnosis not present

## 2018-03-01 DIAGNOSIS — F0390 Unspecified dementia without behavioral disturbance: Secondary | ICD-10-CM | POA: Diagnosis not present

## 2018-03-01 DIAGNOSIS — M79621 Pain in right upper arm: Secondary | ICD-10-CM | POA: Diagnosis not present

## 2018-03-01 DIAGNOSIS — L84 Corns and callosities: Secondary | ICD-10-CM | POA: Diagnosis not present

## 2018-03-01 DIAGNOSIS — Z992 Dependence on renal dialysis: Secondary | ICD-10-CM | POA: Diagnosis not present

## 2018-03-01 DIAGNOSIS — E1051 Type 1 diabetes mellitus with diabetic peripheral angiopathy without gangrene: Secondary | ICD-10-CM | POA: Diagnosis not present

## 2018-03-02 DIAGNOSIS — F0390 Unspecified dementia without behavioral disturbance: Secondary | ICD-10-CM | POA: Diagnosis not present

## 2018-03-03 ENCOUNTER — Telehealth: Payer: Self-pay | Admitting: *Deleted

## 2018-03-03 DIAGNOSIS — Z7689 Persons encountering health services in other specified circumstances: Secondary | ICD-10-CM | POA: Diagnosis not present

## 2018-03-03 DIAGNOSIS — N186 End stage renal disease: Secondary | ICD-10-CM | POA: Diagnosis not present

## 2018-03-03 DIAGNOSIS — Z992 Dependence on renal dialysis: Secondary | ICD-10-CM | POA: Diagnosis not present

## 2018-03-03 DIAGNOSIS — F0390 Unspecified dementia without behavioral disturbance: Secondary | ICD-10-CM | POA: Diagnosis not present

## 2018-03-03 NOTE — Telephone Encounter (Signed)
Patient daughter [name] returned call and made aware.

## 2018-03-03 NOTE — Telephone Encounter (Signed)
Received [place] results from RUE soft tissue mass.   Impression: small complex fluid collection noted anterior to dialysis graft.   MD reviewed and states that area is hematoma that will clear on it's own, but it may take a few weeks.   Call placed to patient daughter, [name]. [place].   Call placed to facility and made aware. Report faxed to facility.

## 2018-03-04 DIAGNOSIS — F0390 Unspecified dementia without behavioral disturbance: Secondary | ICD-10-CM | POA: Diagnosis not present

## 2018-03-05 DIAGNOSIS — F0390 Unspecified dementia without behavioral disturbance: Secondary | ICD-10-CM | POA: Diagnosis not present

## 2018-03-06 DIAGNOSIS — Z992 Dependence on renal dialysis: Secondary | ICD-10-CM | POA: Diagnosis not present

## 2018-03-06 DIAGNOSIS — F0390 Unspecified dementia without behavioral disturbance: Secondary | ICD-10-CM | POA: Diagnosis not present

## 2018-03-06 DIAGNOSIS — Z7689 Persons encountering health services in other specified circumstances: Secondary | ICD-10-CM | POA: Diagnosis not present

## 2018-03-06 DIAGNOSIS — N186 End stage renal disease: Secondary | ICD-10-CM | POA: Diagnosis not present

## 2018-03-07 DIAGNOSIS — F0390 Unspecified dementia without behavioral disturbance: Secondary | ICD-10-CM | POA: Diagnosis not present

## 2018-03-08 DIAGNOSIS — Z7689 Persons encountering health services in other specified circumstances: Secondary | ICD-10-CM | POA: Diagnosis not present

## 2018-03-08 DIAGNOSIS — N186 End stage renal disease: Secondary | ICD-10-CM | POA: Diagnosis not present

## 2018-03-08 DIAGNOSIS — F0390 Unspecified dementia without behavioral disturbance: Secondary | ICD-10-CM | POA: Diagnosis not present

## 2018-03-08 DIAGNOSIS — Z992 Dependence on renal dialysis: Secondary | ICD-10-CM | POA: Diagnosis not present

## 2018-03-09 DIAGNOSIS — F0390 Unspecified dementia without behavioral disturbance: Secondary | ICD-10-CM | POA: Diagnosis not present

## 2018-03-10 DIAGNOSIS — Z992 Dependence on renal dialysis: Secondary | ICD-10-CM | POA: Diagnosis not present

## 2018-03-10 DIAGNOSIS — Z7689 Persons encountering health services in other specified circumstances: Secondary | ICD-10-CM | POA: Diagnosis not present

## 2018-03-10 DIAGNOSIS — F0390 Unspecified dementia without behavioral disturbance: Secondary | ICD-10-CM | POA: Diagnosis not present

## 2018-03-10 DIAGNOSIS — N186 End stage renal disease: Secondary | ICD-10-CM | POA: Diagnosis not present

## 2018-03-11 DIAGNOSIS — F0390 Unspecified dementia without behavioral disturbance: Secondary | ICD-10-CM | POA: Diagnosis not present

## 2018-03-11 DIAGNOSIS — N186 End stage renal disease: Secondary | ICD-10-CM | POA: Diagnosis not present

## 2018-03-13 DIAGNOSIS — Z992 Dependence on renal dialysis: Secondary | ICD-10-CM | POA: Diagnosis not present

## 2018-03-13 DIAGNOSIS — Z7689 Persons encountering health services in other specified circumstances: Secondary | ICD-10-CM | POA: Diagnosis not present

## 2018-03-13 DIAGNOSIS — N186 End stage renal disease: Secondary | ICD-10-CM | POA: Diagnosis not present

## 2018-03-15 DIAGNOSIS — Z7689 Persons encountering health services in other specified circumstances: Secondary | ICD-10-CM | POA: Diagnosis not present

## 2018-03-15 DIAGNOSIS — J449 Chronic obstructive pulmonary disease, unspecified: Secondary | ICD-10-CM | POA: Diagnosis not present

## 2018-03-15 DIAGNOSIS — I509 Heart failure, unspecified: Secondary | ICD-10-CM | POA: Diagnosis not present

## 2018-03-15 DIAGNOSIS — R269 Unspecified abnormalities of gait and mobility: Secondary | ICD-10-CM | POA: Diagnosis not present

## 2018-03-15 DIAGNOSIS — Z992 Dependence on renal dialysis: Secondary | ICD-10-CM | POA: Diagnosis not present

## 2018-03-15 DIAGNOSIS — R296 Repeated falls: Secondary | ICD-10-CM | POA: Diagnosis not present

## 2018-03-15 DIAGNOSIS — N186 End stage renal disease: Secondary | ICD-10-CM | POA: Diagnosis not present

## 2018-03-17 DIAGNOSIS — N186 End stage renal disease: Secondary | ICD-10-CM | POA: Diagnosis not present

## 2018-03-17 DIAGNOSIS — Z7689 Persons encountering health services in other specified circumstances: Secondary | ICD-10-CM | POA: Diagnosis not present

## 2018-03-17 DIAGNOSIS — Z992 Dependence on renal dialysis: Secondary | ICD-10-CM | POA: Diagnosis not present

## 2018-03-20 DIAGNOSIS — Z992 Dependence on renal dialysis: Secondary | ICD-10-CM | POA: Diagnosis not present

## 2018-03-20 DIAGNOSIS — Z7689 Persons encountering health services in other specified circumstances: Secondary | ICD-10-CM | POA: Diagnosis not present

## 2018-03-20 DIAGNOSIS — N186 End stage renal disease: Secondary | ICD-10-CM | POA: Diagnosis not present

## 2018-03-22 DIAGNOSIS — N186 End stage renal disease: Secondary | ICD-10-CM | POA: Diagnosis not present

## 2018-03-22 DIAGNOSIS — Z7689 Persons encountering health services in other specified circumstances: Secondary | ICD-10-CM | POA: Diagnosis not present

## 2018-03-22 DIAGNOSIS — Z992 Dependence on renal dialysis: Secondary | ICD-10-CM | POA: Diagnosis not present

## 2018-03-24 DIAGNOSIS — Z992 Dependence on renal dialysis: Secondary | ICD-10-CM | POA: Diagnosis not present

## 2018-03-24 DIAGNOSIS — Z7689 Persons encountering health services in other specified circumstances: Secondary | ICD-10-CM | POA: Diagnosis not present

## 2018-03-24 DIAGNOSIS — N186 End stage renal disease: Secondary | ICD-10-CM | POA: Diagnosis not present

## 2018-03-27 DIAGNOSIS — Z7689 Persons encountering health services in other specified circumstances: Secondary | ICD-10-CM | POA: Diagnosis not present

## 2018-03-27 DIAGNOSIS — Z992 Dependence on renal dialysis: Secondary | ICD-10-CM | POA: Diagnosis not present

## 2018-03-27 DIAGNOSIS — N186 End stage renal disease: Secondary | ICD-10-CM | POA: Diagnosis not present

## 2018-03-29 DIAGNOSIS — Z7689 Persons encountering health services in other specified circumstances: Secondary | ICD-10-CM | POA: Diagnosis not present

## 2018-03-29 DIAGNOSIS — N186 End stage renal disease: Secondary | ICD-10-CM | POA: Diagnosis not present

## 2018-03-29 DIAGNOSIS — Z992 Dependence on renal dialysis: Secondary | ICD-10-CM | POA: Diagnosis not present

## 2018-03-31 DIAGNOSIS — Z992 Dependence on renal dialysis: Secondary | ICD-10-CM | POA: Diagnosis not present

## 2018-03-31 DIAGNOSIS — N186 End stage renal disease: Secondary | ICD-10-CM | POA: Diagnosis not present

## 2018-03-31 DIAGNOSIS — Z7689 Persons encountering health services in other specified circumstances: Secondary | ICD-10-CM | POA: Diagnosis not present

## 2018-04-03 DIAGNOSIS — Z992 Dependence on renal dialysis: Secondary | ICD-10-CM | POA: Diagnosis not present

## 2018-04-03 DIAGNOSIS — E785 Hyperlipidemia, unspecified: Secondary | ICD-10-CM | POA: Diagnosis not present

## 2018-04-03 DIAGNOSIS — N186 End stage renal disease: Secondary | ICD-10-CM | POA: Diagnosis not present

## 2018-04-03 DIAGNOSIS — E119 Type 2 diabetes mellitus without complications: Secondary | ICD-10-CM | POA: Diagnosis not present

## 2018-04-03 DIAGNOSIS — Z79899 Other long term (current) drug therapy: Secondary | ICD-10-CM | POA: Diagnosis not present

## 2018-04-03 DIAGNOSIS — Z7689 Persons encountering health services in other specified circumstances: Secondary | ICD-10-CM | POA: Diagnosis not present

## 2018-04-05 DIAGNOSIS — Z7689 Persons encountering health services in other specified circumstances: Secondary | ICD-10-CM | POA: Diagnosis not present

## 2018-04-05 DIAGNOSIS — N186 End stage renal disease: Secondary | ICD-10-CM | POA: Diagnosis not present

## 2018-04-05 DIAGNOSIS — Z992 Dependence on renal dialysis: Secondary | ICD-10-CM | POA: Diagnosis not present

## 2018-04-07 DIAGNOSIS — N186 End stage renal disease: Secondary | ICD-10-CM | POA: Diagnosis not present

## 2018-04-07 DIAGNOSIS — Z7689 Persons encountering health services in other specified circumstances: Secondary | ICD-10-CM | POA: Diagnosis not present

## 2018-04-07 DIAGNOSIS — Z992 Dependence on renal dialysis: Secondary | ICD-10-CM | POA: Diagnosis not present

## 2018-04-10 DIAGNOSIS — Z992 Dependence on renal dialysis: Secondary | ICD-10-CM | POA: Diagnosis not present

## 2018-04-10 DIAGNOSIS — N186 End stage renal disease: Secondary | ICD-10-CM | POA: Diagnosis not present

## 2018-04-12 DIAGNOSIS — N186 End stage renal disease: Secondary | ICD-10-CM | POA: Diagnosis not present

## 2018-04-12 DIAGNOSIS — Z992 Dependence on renal dialysis: Secondary | ICD-10-CM | POA: Diagnosis not present

## 2018-04-12 DIAGNOSIS — Z7689 Persons encountering health services in other specified circumstances: Secondary | ICD-10-CM | POA: Diagnosis not present

## 2018-04-14 DIAGNOSIS — Z992 Dependence on renal dialysis: Secondary | ICD-10-CM | POA: Diagnosis not present

## 2018-04-14 DIAGNOSIS — Z7689 Persons encountering health services in other specified circumstances: Secondary | ICD-10-CM | POA: Diagnosis not present

## 2018-04-14 DIAGNOSIS — N186 End stage renal disease: Secondary | ICD-10-CM | POA: Diagnosis not present

## 2018-04-17 DIAGNOSIS — Z992 Dependence on renal dialysis: Secondary | ICD-10-CM | POA: Diagnosis not present

## 2018-04-17 DIAGNOSIS — N186 End stage renal disease: Secondary | ICD-10-CM | POA: Diagnosis not present

## 2018-04-17 DIAGNOSIS — Z7689 Persons encountering health services in other specified circumstances: Secondary | ICD-10-CM | POA: Diagnosis not present

## 2018-04-18 DIAGNOSIS — R296 Repeated falls: Secondary | ICD-10-CM | POA: Diagnosis not present

## 2018-04-18 DIAGNOSIS — R269 Unspecified abnormalities of gait and mobility: Secondary | ICD-10-CM | POA: Diagnosis not present

## 2018-04-18 DIAGNOSIS — I509 Heart failure, unspecified: Secondary | ICD-10-CM | POA: Diagnosis not present

## 2018-04-18 DIAGNOSIS — J449 Chronic obstructive pulmonary disease, unspecified: Secondary | ICD-10-CM | POA: Diagnosis not present

## 2018-04-19 DIAGNOSIS — Z7689 Persons encountering health services in other specified circumstances: Secondary | ICD-10-CM | POA: Diagnosis not present

## 2018-04-19 DIAGNOSIS — N186 End stage renal disease: Secondary | ICD-10-CM | POA: Diagnosis not present

## 2018-04-19 DIAGNOSIS — Z992 Dependence on renal dialysis: Secondary | ICD-10-CM | POA: Diagnosis not present

## 2018-04-21 DIAGNOSIS — Z7689 Persons encountering health services in other specified circumstances: Secondary | ICD-10-CM | POA: Diagnosis not present

## 2018-04-21 DIAGNOSIS — N186 End stage renal disease: Secondary | ICD-10-CM | POA: Diagnosis not present

## 2018-04-21 DIAGNOSIS — Z992 Dependence on renal dialysis: Secondary | ICD-10-CM | POA: Diagnosis not present

## 2018-04-24 DIAGNOSIS — Z7689 Persons encountering health services in other specified circumstances: Secondary | ICD-10-CM | POA: Diagnosis not present

## 2018-04-24 DIAGNOSIS — N186 End stage renal disease: Secondary | ICD-10-CM | POA: Diagnosis not present

## 2018-04-24 DIAGNOSIS — Z992 Dependence on renal dialysis: Secondary | ICD-10-CM | POA: Diagnosis not present

## 2018-04-26 DIAGNOSIS — Z7689 Persons encountering health services in other specified circumstances: Secondary | ICD-10-CM | POA: Diagnosis not present

## 2018-04-26 DIAGNOSIS — N186 End stage renal disease: Secondary | ICD-10-CM | POA: Diagnosis not present

## 2018-04-26 DIAGNOSIS — Z992 Dependence on renal dialysis: Secondary | ICD-10-CM | POA: Diagnosis not present

## 2018-04-28 DIAGNOSIS — N186 End stage renal disease: Secondary | ICD-10-CM | POA: Diagnosis not present

## 2018-04-28 DIAGNOSIS — Z992 Dependence on renal dialysis: Secondary | ICD-10-CM | POA: Diagnosis not present

## 2018-04-28 DIAGNOSIS — Z7689 Persons encountering health services in other specified circumstances: Secondary | ICD-10-CM | POA: Diagnosis not present

## 2018-05-01 DIAGNOSIS — Z992 Dependence on renal dialysis: Secondary | ICD-10-CM | POA: Diagnosis not present

## 2018-05-01 DIAGNOSIS — Z7689 Persons encountering health services in other specified circumstances: Secondary | ICD-10-CM | POA: Diagnosis not present

## 2018-05-01 DIAGNOSIS — N186 End stage renal disease: Secondary | ICD-10-CM | POA: Diagnosis not present

## 2018-05-02 IMAGING — CR DG CHEST 1V PORT
1 series · 1 of 1 positions shown · non-contrast
Comparison: 09/30/2015

CLINICAL DATA: Shortness of breath for 4 days

EXAM:
PORTABLE CHEST 1 VIEW

[portable]
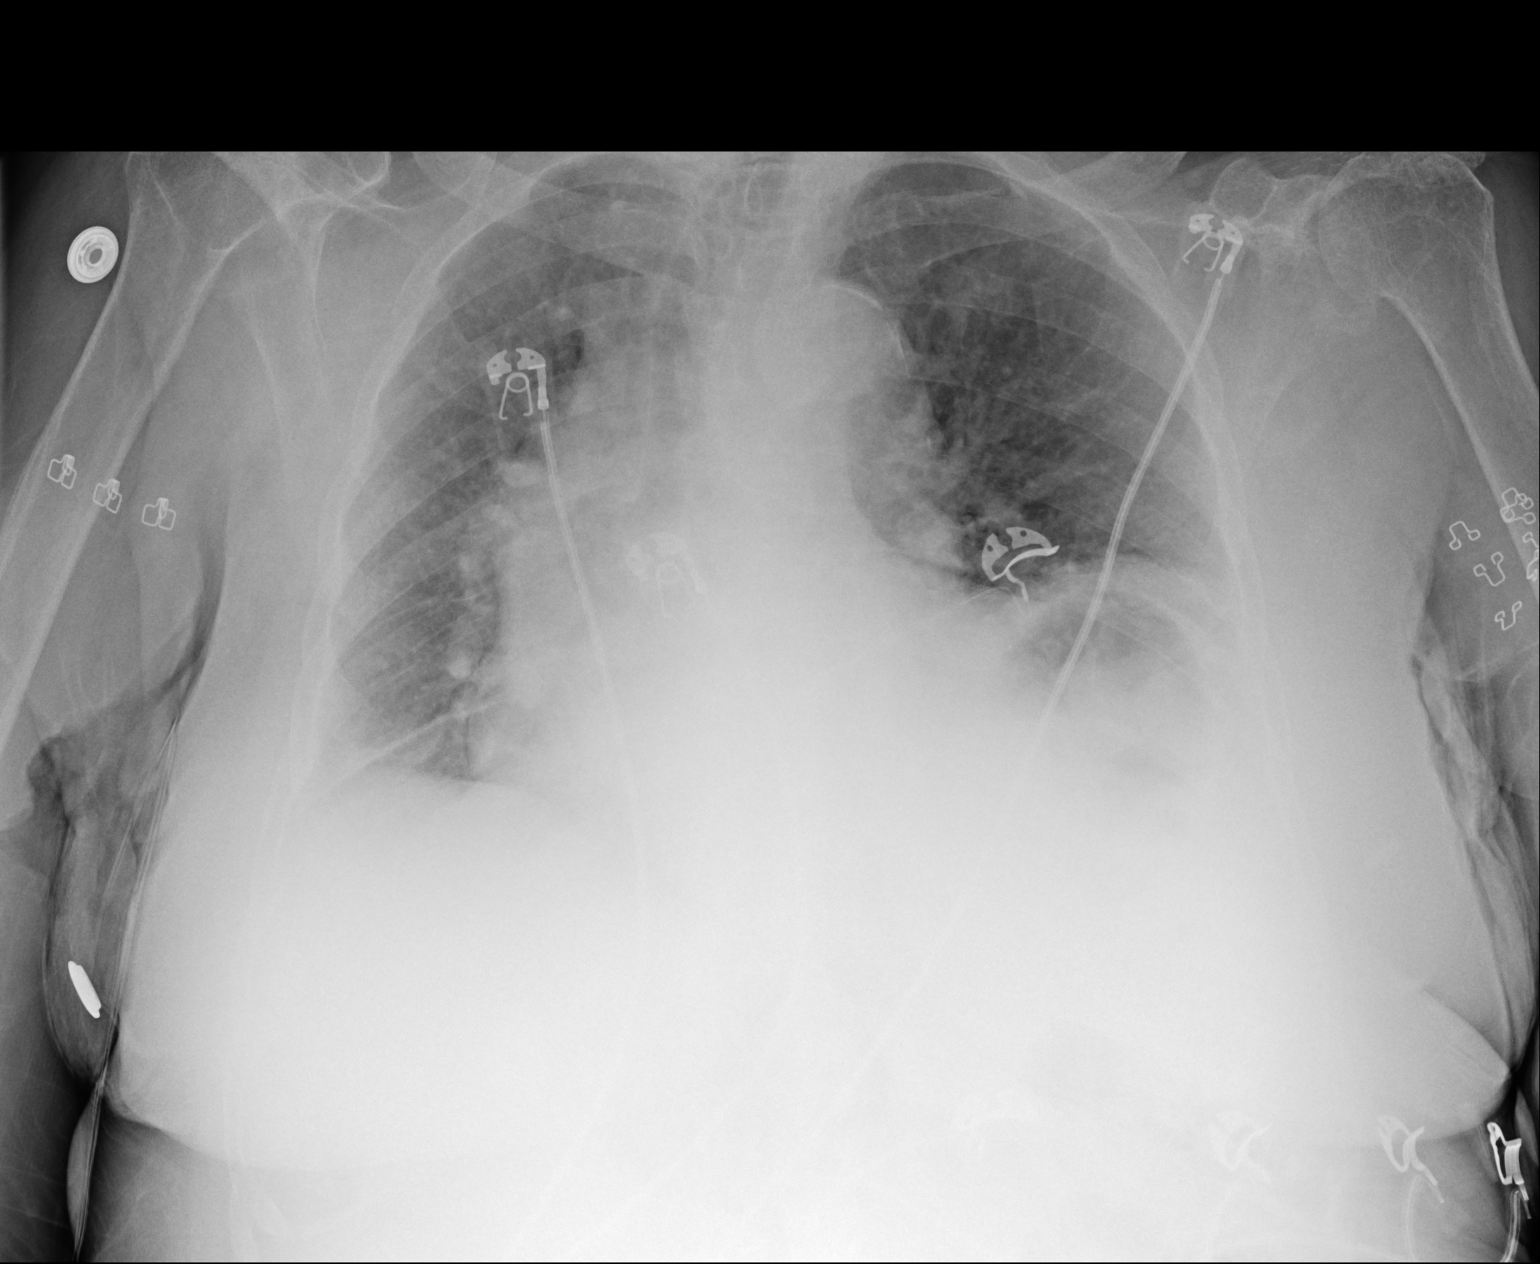

[1 of 1 positions shown; findings below may reference images not displayed]

FINDINGS: Cardiomegaly again noted. Chronic elevation of the left
hemidiaphragm. Atherosclerotic calcifications of thoracic aorta.
Bilateral shoulder degenerative changes. Mild interstitial
prominence bilateral without convincing pulmonary edema. No
segmental infiltrate. Left basilar atelectasis.
IMPRESSION: Chronic elevation of the left hemidiaphragm. Atherosclerotic
calcifications of thoracic aorta. Bilateral shoulder degenerative
changes. Mild interstitial prominence bilateral without convincing
pulmonary edema. No segmental infiltrate. Left basilar atelectasis.

## 2018-05-03 DIAGNOSIS — Z992 Dependence on renal dialysis: Secondary | ICD-10-CM | POA: Diagnosis not present

## 2018-05-03 DIAGNOSIS — Z7689 Persons encountering health services in other specified circumstances: Secondary | ICD-10-CM | POA: Diagnosis not present

## 2018-05-03 DIAGNOSIS — N186 End stage renal disease: Secondary | ICD-10-CM | POA: Diagnosis not present

## 2018-05-05 DIAGNOSIS — N186 End stage renal disease: Secondary | ICD-10-CM | POA: Diagnosis not present

## 2018-05-05 DIAGNOSIS — Z992 Dependence on renal dialysis: Secondary | ICD-10-CM | POA: Diagnosis not present

## 2018-05-05 DIAGNOSIS — Z7689 Persons encountering health services in other specified circumstances: Secondary | ICD-10-CM | POA: Diagnosis not present

## 2018-05-08 DIAGNOSIS — Z7689 Persons encountering health services in other specified circumstances: Secondary | ICD-10-CM | POA: Diagnosis not present

## 2018-05-08 DIAGNOSIS — N186 End stage renal disease: Secondary | ICD-10-CM | POA: Diagnosis not present

## 2018-05-08 DIAGNOSIS — Z992 Dependence on renal dialysis: Secondary | ICD-10-CM | POA: Diagnosis not present

## 2018-05-10 DIAGNOSIS — N186 End stage renal disease: Secondary | ICD-10-CM | POA: Diagnosis not present

## 2018-05-10 DIAGNOSIS — Z992 Dependence on renal dialysis: Secondary | ICD-10-CM | POA: Diagnosis not present

## 2018-05-10 DIAGNOSIS — Z7689 Persons encountering health services in other specified circumstances: Secondary | ICD-10-CM | POA: Diagnosis not present

## 2018-05-11 ENCOUNTER — Emergency Department (HOSPITAL_COMMUNITY)
Admission: EM | Admit: 2018-05-11 | Discharge: 2018-05-12 | Disposition: E | Payer: Medicare HMO | Attending: Emergency Medicine | Admitting: Emergency Medicine

## 2018-05-11 ENCOUNTER — Telehealth: Payer: Self-pay | Admitting: Family Medicine

## 2018-05-11 ENCOUNTER — Emergency Department (HOSPITAL_COMMUNITY): Payer: Medicare HMO

## 2018-05-11 ENCOUNTER — Telehealth: Payer: Self-pay | Admitting: *Deleted

## 2018-05-11 ENCOUNTER — Encounter (HOSPITAL_COMMUNITY): Payer: Self-pay | Admitting: *Deleted

## 2018-05-11 ENCOUNTER — Other Ambulatory Visit: Payer: Self-pay

## 2018-05-11 DIAGNOSIS — Z79899 Other long term (current) drug therapy: Secondary | ICD-10-CM | POA: Insufficient documentation

## 2018-05-11 DIAGNOSIS — I120 Hypertensive chronic kidney disease with stage 5 chronic kidney disease or end stage renal disease: Secondary | ICD-10-CM | POA: Diagnosis not present

## 2018-05-11 DIAGNOSIS — E039 Hypothyroidism, unspecified: Secondary | ICD-10-CM | POA: Diagnosis not present

## 2018-05-11 DIAGNOSIS — Z7982 Long term (current) use of aspirin: Secondary | ICD-10-CM | POA: Insufficient documentation

## 2018-05-11 DIAGNOSIS — Z992 Dependence on renal dialysis: Secondary | ICD-10-CM | POA: Diagnosis not present

## 2018-05-11 DIAGNOSIS — R404 Transient alteration of awareness: Secondary | ICD-10-CM | POA: Diagnosis not present

## 2018-05-11 DIAGNOSIS — I469 Cardiac arrest, cause unspecified: Secondary | ICD-10-CM | POA: Insufficient documentation

## 2018-05-11 DIAGNOSIS — R079 Chest pain, unspecified: Secondary | ICD-10-CM | POA: Diagnosis not present

## 2018-05-11 DIAGNOSIS — I10 Essential (primary) hypertension: Secondary | ICD-10-CM

## 2018-05-11 DIAGNOSIS — R0902 Hypoxemia: Secondary | ICD-10-CM | POA: Diagnosis not present

## 2018-05-11 DIAGNOSIS — R410 Disorientation, unspecified: Secondary | ICD-10-CM | POA: Diagnosis not present

## 2018-05-11 DIAGNOSIS — R402411 Glasgow coma scale score 13-15, in the field [EMT or ambulance]: Secondary | ICD-10-CM | POA: Diagnosis not present

## 2018-05-11 DIAGNOSIS — E119 Type 2 diabetes mellitus without complications: Secondary | ICD-10-CM | POA: Insufficient documentation

## 2018-05-11 DIAGNOSIS — N186 End stage renal disease: Secondary | ICD-10-CM | POA: Diagnosis not present

## 2018-05-11 LAB — BASIC METABOLIC PANEL
Anion gap: 14 (ref 5–15)
BUN: 45 mg/dL — ABNORMAL HIGH (ref 8–23)
CO2: 28 mmol/L (ref 22–32)
Calcium: 9.1 mg/dL (ref 8.9–10.3)
Chloride: 91 mmol/L — ABNORMAL LOW (ref 98–111)
Creatinine, Ser: 6.85 mg/dL — ABNORMAL HIGH (ref 0.44–1.00)
GFR calc Af Amer: 6 mL/min — ABNORMAL LOW (ref >60)
GFR calc non Af Amer: 5 mL/min — ABNORMAL LOW (ref >60)
Glucose, Bld: 192 mg/dL — ABNORMAL HIGH (ref 70–99)
Potassium: 4.4 mmol/L (ref 3.5–5.1)
Sodium: 133 mmol/L — ABNORMAL LOW (ref 135–145)

## 2018-05-11 LAB — CBC WITH DIFFERENTIAL/PLATELET
Abs Immature Granulocytes: 0.04 10*3/uL (ref 0.00–0.07)
Basophils Absolute: 0 10*3/uL (ref 0.0–0.1)
Basophils Relative: 0 %
Eosinophils Absolute: 0.1 10*3/uL (ref 0.0–0.5)
Eosinophils Relative: 1 %
HCT: 38.7 % (ref 36.0–46.0)
Hemoglobin: 12.2 g/dL (ref 12.0–15.0)
Immature Granulocytes: 0 %
Lymphocytes Relative: 5 %
Lymphs Abs: 0.6 10*3/uL — ABNORMAL LOW (ref 0.7–4.0)
MCH: 25.7 pg — ABNORMAL LOW (ref 26.0–34.0)
MCHC: 31.5 g/dL (ref 30.0–36.0)
MCV: 81.6 fL (ref 80.0–100.0)
Monocytes Absolute: 0.9 10*3/uL (ref 0.1–1.0)
Monocytes Relative: 9 %
Neutro Abs: 9.2 10*3/uL — ABNORMAL HIGH (ref 1.7–7.7)
Neutrophils Relative %: 85 %
Platelets: 217 10*3/uL (ref 150–400)
RBC: 4.74 MIL/uL (ref 3.87–5.11)
RDW: 14.2 % (ref 11.5–15.5)
WBC: 10.8 10*3/uL — ABNORMAL HIGH (ref 4.0–10.5)
nRBC: 0 % (ref 0.0–0.2)

## 2018-05-11 LAB — BLOOD GAS, VENOUS
Acid-Base Excess: 6 mmol/L — ABNORMAL HIGH (ref 0.0–2.0)
Bicarbonate: 28 mmol/L (ref 20.0–28.0)
FIO2: 28
O2 Saturation: 75.6 %
Patient temperature: 38.1
pCO2, Ven: 58.8 mmHg (ref 44.0–60.0)
pH, Ven: 7.348 (ref 7.250–7.430)
pO2, Ven: 48.4 mmHg — ABNORMAL HIGH (ref 32.0–45.0)

## 2018-05-11 LAB — TROPONIN I: Troponin I: 0.08 ng/mL (ref <0.03)

## 2018-05-11 MED ORDER — EPINEPHRINE 1 MG/10ML IJ SOSY
PREFILLED_SYRINGE | INTRAMUSCULAR | Status: AC
  Filled 2018-05-11: qty 20

## 2018-05-11 MED ORDER — ONDANSETRON 4 MG PO TBDP: 4.0000 mg | ORAL_TABLET | Freq: Three times a day (TID) | ORAL | 0 refills | Status: AC | PRN

## 2018-05-11 MED ORDER — SODIUM CHLORIDE 0.9 % IV SOLN: INTRAVENOUS | Status: DC

## 2018-05-11 MED ORDER — EPINEPHRINE 1 MG/10ML IJ SOSY
PREFILLED_SYRINGE | INTRAMUSCULAR | Status: AC | PRN
  Administered 2018-05-11 (×3): 1 mg via INTRAVENOUS

## 2018-05-11 MED ORDER — EPINEPHRINE 1 MG/10ML IJ SOSY
PREFILLED_SYRINGE | INTRAMUSCULAR | Status: AC | PRN
  Administered 2018-05-11 (×2): 1 mg via INTRAVENOUS

## 2018-05-11 MED ORDER — LABETALOL HCL 5 MG/ML IV SOLN: 20.0000 mg | Freq: Once | INTRAVENOUS | Status: DC

## 2018-05-11 MED ORDER — SODIUM BICARBONATE 8.4 % IV SOLN
INTRAVENOUS | Status: AC | PRN
  Administered 2018-05-11: 50 meq via INTRAVENOUS

## 2018-05-11 MED ORDER — LABETALOL HCL 5 MG/ML IV SOLN
20.0000 mg | Freq: Once | INTRAVENOUS | Status: AC
  Administered 2018-05-11: 17:00:00 20 mg via INTRAVENOUS
  Filled 2018-05-11: qty 4

## 2018-05-12 NOTE — ED Notes (Addendum)
CPR already in progress when this RN arrived. Dr. Eulis Foster at bedside.

## 2018-05-12 NOTE — Code Documentation (Signed)
CPR still in progress.

## 2018-05-12 NOTE — ED Notes (Signed)
CPR still in progress. Preparing to intubate.

## 2018-05-12 NOTE — Progress Notes (Signed)
Responded to [place]. Pt bagged and intubated by Dr. Eulis Foster. Bilateral breath sounds and positive color change on Co2 detector. CPR continued until TOD.

## 2018-05-12 NOTE — Code Documentation (Addendum)
Intubated with 7.5 ET tube. Positive color change. Breath sounds heard bilaterally by Lucretia, RT.

## 2018-05-12 NOTE — Telephone Encounter (Signed)
Receive call from [place], [facility] with [place]. [phone]~ telephone.   Reports that patient had nausea and vomiting last night. Reports several episodes of vomiting last night, but no vomiting this AM. Has eaten dry toast and ginger ale, but continues to have nausea.   VS are as follows: BP 149/85, HR 85, T 97.1 o, SPO2 98% on 3L/min via [place].   Requested order for Zofran. Fellow MD made aware and approved.   Order faxed to facility and prescription sent to pharmacy.

## 2018-05-12 NOTE — ED Notes (Addendum)
Charted in error.

## 2018-05-12 NOTE — ED Notes (Signed)
CRITICAL VALUE ALERT  Critical Value:  Troponin 0.08  Date & Time Notied:  05-25-18@ 1814  Provider Notified: Dr Eulis Foster  Orders Received/Actions taken: see new orders.

## 2018-05-12 NOTE — ED Provider Notes (Signed)
[facility] EMERGENCY DEPARTMENT Provider Note   CSN: [id number] Arrival date & time: 01-Jun-2018  1516    History   Chief Complaint Chief Complaint  Patient presents with  . Hypertension    HPI [name] is a 85 y.o. female.     HPI   Patient presents for evaluation from her nursing care facility.  She complains of pain in her chest, abdomen and low back.  Nursing home noted that she was hypertensive and sent her here.  Patient currently takes hemodialysis and is compliant with treatment.  She does not know of any sick contacts.  She denies cough, chest pain, fever, chills, nausea, vomiting or diarrhea.  She states her blood pressure has been elevated for about a month.  She is taking her usual medications.  There are no other known modifying factors.  Past Medical History:  Diagnosis Date  . Anemia   . Anemia in CKD (chronic kidney disease)   . Anxiety   . Arthritis    gout, right shoulder  . Cancer [facility])    ?stomach cancer   . Cataracts, both eyes   . CHF (congestive heart failure) ([place])   . Depression   . Depression   . Diabetes mellitus, type 2 (HCC)    Type II - no medication  . Dyspnea   . ESRD (end stage renal disease) ([place])    [facility]  . GERD (gastroesophageal reflux disease)   . Gynecologic cancer   . HOH (hard of hearing)   . Hyperlipidemia   . Hypertension   . Hypothyroidism   . IBS (irritable bowel syndrome)    with costipation  . Intraoperative hemorrhage and hematoma of a digestive system organ or structure complicating a digestive system procedure   . Normal cardiac stress test 05/2015  . Overactive bladder   . Pneumonia    2 times in 2016  . SOB (shortness of breath)    nml spirometry 09/05/06  . Stroke [facility])    residual right side weakness and pain  . SVT (supraventricular tachycardia) ([place])   . Syncope and collapse   . Thyroid disease     Patient Active Problem List   Diagnosis Date Noted  . Pancreatic cyst  08/31/2016  . COPD (chronic obstructive pulmonary disease) ([place]) 08/11/2016  . Essential hypertension 08/11/2016  . Chronic pain 08/11/2016  . Esophageal dysmotility 06/01/2016  . Dysphagia   . Chronic diastolic heart failure ([place]) 10/07/2015  . Depression   . Pseudoaneurysm of arteriovenous dialysis fistula ([place]) 05/29/2015  . Decreased hearing of both ears 02/12/2014  . At high risk for falls 02/12/2014  . OA (osteoarthritis) of knee 09/18/2013  . MDD (major depressive disorder) ([place]) 04/05/2013  . Mechanical complication of other vascular device, implant, and graft 11/14/2012  . Pain in limb-Left arm 07/11/2012  . Hypothyroidism 06/08/2012  . GERD (gastroesophageal reflux disease) 03/23/2012  . Gait instability 01/13/2012  . Neuropathy of hand 01/13/2012  . Breast mass, right 07/11/2011  . Hemorrhoid 03/14/2011  . ESRD (end stage renal disease) ([place]) 02/12/2011  . Dyspnea 01/17/2011  . Chronic respiratory failure ([place]) 01/13/2011  . Chronic constipation 12/09/2010  . Insomnia 10/14/2010  . Diabetes mellitus with renal complications ([place]) 87/86/7672  . ALLERGIC RHINITIS 04/03/2008  . DEPENDENT EDEMA, LEGS, BILATERAL 11/17/2006  . HYPERLIPIDEMIA 10/10/2006  . Anemia in chronic kidney disease 10/10/2006  . Anxiety 09/05/2006  . CATARACT NOS 09/05/2006  . IBS 09/05/2006  . ARTHRITIS 09/05/2006  Past Surgical History:  Procedure Laterality Date  . ABDOMINAL HYSTERECTOMY    . AV FISTULA PLACEMENT Right 05/23/2017   Procedure: RIGHT ARTERIOVENOUS (AV) BRACHIOCEPHALIC FISTULA CREATION;  Surgeon: Elam Dutch, MD;  Location: [place];  Service: Vascular;  Laterality: Right;  . AV FISTULA PLACEMENT, BRACHIOCEPHALIC  98/33/8250   left arm   by Dr. Bridgett Larsson  . COLONOSCOPY    . EYE SURGERY Bilateral    Cataract  . FISTULA SUPERFICIALIZATION Left 06/10/2015   Procedure: LEFT UPPER ARM BRACHIOCEPHALIC FISTULA PSEUDOANEURYSM PLICATION;  Surgeon: Conrad Chickasaw, MD;  Location: [place];   Service: Vascular;  Laterality: Left;  . INSERTION OF DIALYSIS CATHETER N/A 04/06/2017   Procedure: INSERTION OF DIALYSIS CATHETER;  Surgeon: Serafina Mitchell, MD;  Location: [facility] OR;  Service: Vascular;  Laterality: N/A;  . IR REMOVAL TUN CV CATH W/O FL  11/01/2017  . IR THROMBECTOMY AV FISTULA W/THROMBOLYSIS/PTA INC/SHUNT/IMG LEFT Left 05/06/2016  . IR US GUIDE VASC ACCESS LEFT  05/06/2016  . PERIPHERAL VASCULAR CATHETERIZATION Left 05/15/2015   Procedure: Fistulagram;  Surgeon: Conrad Humacao, MD;  Location: [place] CV LAB;  Service: Cardiovascular;  Laterality: Left;  . TEE WITHOUT CARDIOVERSION  2011   2/2 Strep Bovis infection  . VESICOVAGINAL FISTULA CLOSURE W/ TAH       OB History   No obstetric history on file.      Home Medications    Prior to Admission medications   Medication Sig Start Date End Date Taking? Authorizing Provider  aspirin EC 81 MG tablet TAKE (1) TABLET BY MOUTH ONCE DAILY. 01/23/18  Yes Francis, Modena Nunnery, MD  citalopram (CELEXA) 20 MG tablet TAKE (1) TABLET BY MOUTH ONCE IN THE MORNING. 03/01/18  Yes [place], Modena Nunnery, MD  diltiazem (CARDIZEM) 60 MG tablet Take 60 mg by mouth 2 (two) times daily.   Yes [provider]  docusate sodium (COLACE) 100 MG capsule TAKE 1 CAPSULE BY MOUTH TWICE DAILY. 01/23/18  Yes [place], Modena Nunnery, MD  isosorbide mononitrate (IMDUR) 60 MG 24 hr tablet TAKE (1) TABLET BY MOUTH ONCE DAILY. 01/23/18  Yes [place], Modena Nunnery, MD  levothyroxine (SYNTHROID, LEVOTHROID) 25 MCG tablet TAKE (1) TABLET BY MOUTH ONCE DAILY BEFORE BREAKFAST. 01/23/18  Yes [place], Modena Nunnery, MD  lidocaine-prilocaine (EMLA) cream APPLY TOPICALLY TO LEFT UPPER ARM 1 HOUR PRIOR TO DIALYSIS ON MONDAY,WEDNESDAY & FRIDAYS AS NEEDED FOR PAIN. WRAP WITH PLASTIC WRAP AFTER A 03/23/16  Yes [place], Modena Nunnery, MD  mirtazapine (REMERON) 15 MG tablet TAKE 1/2 TABLET (7.5MG ) BY MOUTH AT BEDTIME FOR MOOD/APPETITE. 01/23/18  Yes [place], Modena Nunnery, MD  pantoprazole (PROTONIX) 40 MG  tablet TAKE (1) TABLET BY MOUTH ONCE DAILY. 06/23/17  Yes [place], Modena Nunnery, MD  polyethylene glycol powder (MIRALAX) powder Take 17 g by mouth daily. 05/24/17  Yes Rhyne, Hulen Shouts, PA-C  sevelamer (RENVELA) 800 MG tablet Take 800-2,400 mg by mouth See admin instructions. Take 2400 mg by mouth three times daily with meals and take 800 mg by mouth once daily in the evening with snack.   Yes [provider]  albuterol (PROVENTIL) (2.5 MG/3ML) 0.083% nebulizer solution USE 1 VIAL IN NEBULIZER 3 TIMES DAILY AS NEEDED FOR WHEEZING/SHORTNESS OF BREATH. 10/01/15   [place], Modena Nunnery, MD  ARTIFICIAL TEARS 1.4 % ophthalmic solution INSTILL 1 DROP INTO EITHER EYE UP TO 6 TIMES DAILY AS NEEDED FOR LUBRICATION 08/18/16   Alycia Rossetti, MD  cinacalcet (SENSIPAR) 30 MG tablet TAKE 1 TABLET  BY MOUTH ONCE DAILY AFTER THE EVENING MEAL 03/01/18   [place], Modena Nunnery, MD  diltiazem (CARDIZEM CD) 180 MG 24 hr capsule TAKE 1 CAPSULE BY MOUTH ONCE A DAY. Patient not taking: Reported on 05-16-18 06/25/16   Alycia Rossetti, MD  ipratropium (ATROVENT) 0.02 % nebulizer solution USE 1 VIAL IN NEBULIZER 3 TIMES DAILY AS NEEDED FOR WHEEZING/SHORTNESS OF BREATH. 05/04/17   Alycia Rossetti, MD  Multiple Vitamin (DAILY-VITE) TABS TAKE 1 TABLET BY MOUTH ONCE DAILY. (ON DAY OF DIALYSIS, TAKE AFTER DIALYSIS) 01/23/18   Alycia Rossetti, MD  Multiple Vitamin (TAB-A-VITE PO) Take 1 tablet by mouth daily.    [provider]  Multiple Vitamins-Iron (MULTIVITAMINS WITH IRON) TABS tablet Take 1 tablet by mouth daily.    [provider]  nitroGLYCERIN (NITROSTAT) 0.4 MG SL tablet DISSOLVE 1 TABLET UNDER THE TONGUE EVERY 5 MINUTES X2 DOSES THEN TO ER 08/23/17   Alycia Rossetti, MD  nystatin (MYCOSTATIN/NYSTOP) powder Apply topically 4 (four) times daily as needed. To groin/ abd folds for irritation 12/19/17   [place], Modena Nunnery, MD  nystatin cream (MYCOSTATIN) Apply 1 application topically 2 (two) times daily. For 1  week 02/21/18   Alycia Rossetti, MD  ondansetron (ZOFRAN-ODT) 4 MG disintegrating tablet Take 1 tablet (4 mg total) by mouth every 8 (eight) hours as needed for nausea or vomiting. May 16, 2018   Susy Frizzle, MD  OXYGEN Inhale 2 L into the lungs continuous.     [provider]  traMADol (ULTRAM) 50 MG tablet TAKE 1 TABLET BY MOUTH EVERY 12 HOURS AS NEEDED FOR MODERATE PAIN 06/22/17   [place], Modena Nunnery, MD  TUSSIN DM 100-10 MG/5ML liquid TAKE ONE TEASPOONFUL BY MOUTH EVERY 4 HOURS AS NEEDED FOR COUGH 04/25/17   [place], Modena Nunnery, MD  VENTOLIN HFA 108 (90 Base) MCG/ACT inhaler INHALE (2) PUFFS EVERY 4 HOURS AS NEEDED FOR WHEEZING OR SHORTNESS OFBREATH(COUGH). 05/04/17   Alycia Rossetti, MD    Family History Family History  Problem Relation Age of Onset  . Stroke Father   . Hypertension Sister   . Heart failure Sister   . Cancer Brother     Social History Social History   Tobacco Use  . Smoking status: Never Smoker  . Smokeless tobacco: Never Used  Substance Use Topics  . Alcohol use: No    Alcohol/week: 0.0 standard drinks  . Drug use: No     Allergies   Patient has no known allergies.   Review of Systems Review of Systems  All other systems reviewed and are negative.    Physical Exam Updated Vital Signs BP (!) 236/106   Pulse 90   Temp (!) 100.7 F (38.2 C) (Oral)   Resp 18   Ht 5\' 5"  (1.651 m)   LMP 12/24/2010   SpO2 100%   BMI 27.96 kg/m   Physical Exam Vitals signs and nursing note reviewed.  Constitutional:      General: She is not in acute distress.    Appearance: Normal appearance. She is well-developed. She is not ill-appearing, toxic-appearing or diaphoretic.  HENT:     Head: Normocephalic and atraumatic.     Right Ear: External ear normal.     Left Ear: External ear normal.     Nose: No congestion or rhinorrhea.  Eyes:     Conjunctiva/sclera: Conjunctivae normal.     Pupils: Pupils are equal, round, and reactive to light.  Neck:      Musculoskeletal:  Normal range of motion and neck supple.     Trachea: Phonation normal.  Cardiovascular:     Rate and Rhythm: Normal rate and regular rhythm.     Heart sounds: Normal heart sounds.     Comments: Vascular fistula right upper arm, with normal thrill.  It is nontender to palpation. Pulmonary:     Effort: Pulmonary effort is normal. No respiratory distress.     Breath sounds: Normal breath sounds. No stridor.  Chest:     Chest wall: No tenderness.  Abdominal:     General: Bowel sounds are normal. There is no distension.     Palpations: Abdomen is soft. There is no mass.     Tenderness: There is abdominal tenderness (Left upper quadrant, mild). There is no guarding.     Hernia: No hernia is present.  Musculoskeletal: Normal range of motion.        General: Tenderness (Bilateral lumbar.) present.     Comments: Mild bilateral lower leg edema.  Legs nontender to palpation.  No palpable popliteal cord, bilaterally.  Skin:    General: Skin is warm and dry.  Neurological:     Mental Status: She is alert and oriented to person, place, and time.     Cranial Nerves: No cranial nerve deficit.     Sensory: No sensory deficit.     Motor: No abnormal muscle tone.     Coordination: Coordination normal.  Psychiatric:        Mood and Affect: Mood normal.        Behavior: Behavior normal.        Thought Content: Thought content normal.        Judgment: Judgment normal.      ED Treatments / Results  Labs (all labs ordered are listed, but only abnormal results are displayed) Labs Reviewed  BASIC METABOLIC PANEL - Abnormal; Notable for the following components:      Result Value   Sodium 133 (*)    Chloride 91 (*)    Glucose, Bld 192 (*)    BUN 45 (*)    Creatinine, Ser 6.85 (*)    GFR calc non Af Amer 5 (*)    GFR calc Af Amer 6 (*)    All other components within normal limits  TROPONIN I - Abnormal; Notable for the following components:   Troponin I 0.08 (*)    All other  components within normal limits  CBC WITH DIFFERENTIAL/PLATELET - Abnormal; Notable for the following components:   WBC 10.8 (*)    MCH 25.7 (*)    Neutro Abs 9.2 (*)    Lymphs Abs 0.6 (*)    All other components within normal limits  BLOOD GAS, VENOUS - Abnormal; Notable for the following components:   pO2, Ven 48.4 (*)    Acid-Base Excess 6.0 (*)    All other components within normal limits    EKG EKG Interpretation  Date/Time:  05-17-2018 15:29:04 EDT Ventricular Rate:  84 PR Interval:    QRS Duration: 99 QT Interval:  407 QTC Calculation: 482 R Axis:   -2 Text Interpretation:  Sinus rhythm Ventricular premature complex LVH with secondary repolarization abnormality Since last tracing pvc is new Confirmed by Daleen Bo [phone]) on 05/17/18 4:18:58 PM   Radiology Dg Chest Port 1 View  Result Date: 2018-05-17 CLINICAL DATA:  Nausea and vomiting since last night, chest pain this morning, hypertension, type II diabetes mellitus EXAM: PORTABLE CHEST 1 VIEW COMPARISON:  Portable exam 1615 hours compared to 05/25/2017 FINDINGS: Enlargement of cardiac silhouette. Calcified tortuous aorta. Mediastinal contours and pulmonary vascularity normal. Atelectasis versus infiltrate at LEFT base. Slight chronic accentuation of interstitial markings without acute infiltrate or edema. Tiny LEFT pleural effusion. No pneumothorax. IMPRESSION: Enlargement of cardiac silhouette with pulmonary vascular congestion and chronic accentuation of interstitial markings. Tiny LEFT pleural effusion and mild atelectasis versus infiltrate at LEFT base. Electronically Signed   By: Lavonia Dana M.D.   On: 05-28-2018 16:37    Procedures Procedure Name: Intubation Date/Time: 2018/05/28 7:13 PM Performed by: Daleen Bo, MD Pre-anesthesia Checklist: Suction available Oxygen Delivery Method: Nasal cannula Preoxygenation: Pre-oxygenation with 100% oxygen Ventilation: Mask ventilation without difficulty  Laryngoscope Size: 4 and Glidescope Grade View: Grade III Tube size: 7.5 mm Number of attempts: 1 Airway Equipment and Method: Video-laryngoscopy Placement Confirmation: ETT inserted through vocal cords under direct vision,  Breath sounds checked- equal and bilateral and Positive ETCO2 Secured at: 25 cm Tube secured with: ETT holder Dental Injury: Teeth and Oropharynx as per pre-operative assessment      .Critical Care Performed by: Daleen Bo, MD Authorized by: Daleen Bo, MD   Critical care provider statement:    Critical care time (minutes):  35   Critical care start time:  05/28/18 3:50 PM   Critical care end time:  2018-05-28 7:22 PM   Critical care time was exclusive of:  Separately billable procedures and treating other patients   Critical care was necessary to treat or prevent imminent or life-threatening deterioration of the following conditions:  Cardiac failure   Critical care was time spent personally by me on the following activities:  Blood draw for specimens, development of treatment plan with patient or surrogate, discussions with consultants, evaluation of patient's response to treatment, examination of patient, obtaining history from patient or surrogate, ordering and performing treatments and interventions, ordering and review of laboratory studies, pulse oximetry, re-evaluation of patient's condition, review of old charts and ordering and review of radiographic studies   (including critical care time)  Cardiopulmonary Resuscitation (CPR) Procedure Note Directed/Performed by: Daleen Bo I personally directed ancillary staff and/or performed CPR in an effort to regain return of spontaneous circulation and to maintain cardiac, neuro and systemic perfusion.      Medications Ordered in ED Medications  0.9 %  sodium chloride infusion ( Intravenous Not Given 05-28-18 1558)  labetalol (NORMODYNE) injection 20 mg (has no administration in time range)   EPINEPHrine (ADRENALIN) 1 MG/10ML injection (has no administration in time range)  labetalol (NORMODYNE) injection 20 mg (20 mg Intravenous Given May 28, 2018 1726)  EPINEPHrine (ADRENALIN) 1 MG/10ML injection (1 mg Intravenous Given 2018-05-28 1812)  sodium bicarbonate injection (50 mEq Intravenous Given 05-28-18 1816)  EPINEPHrine (ADRENALIN) 1 MG/10ML injection (1 mg Intravenous Given 2018/05/28 1821)     Initial Impression / Assessment and Plan / ED Course  I have reviewed the triage vital signs and the nursing notes.  Pertinent labs & imaging results that were available during my care of the patient were reviewed by me and considered in my medical decision making (see chart for details).  Clinical Course as of May 10 1917  Thu 28-May-2018 Patient pulseless and apneic, CODE BLUE called.   [EW]  1828 No infiltrate or CHF, image reviewed by me  DG Chest [facility] [EW]  1829 Mild elevation  Troponin I - Once(!!) [EW]  1829 Normal except PO2 elevated, acid-base high  Blood gas, venous(!) [EW]  1829 Normal except sodium low, glucose high, BUN high, creatinine high, GFR low  Basic metabolic panel(!) [EW]  2951 Normal except white count high, MCH low  CBC with Differential(!) [EW]  1853 I discussed the case with Dr. Vic Blackbird, her PCP.  She will sign the death certificate.   [EW]    Clinical Course User Index [EW] Daleen Bo, MD        Patient Vitals for the past 24 hrs:  BP Temp Temp src Pulse Resp SpO2 Height  2018/06/04 1814 - - - - - 100 % -  06-04-2018 1809 - - - 90 - - -  2018/06/04 1757 - - - 71 - 100 % -  06/04/18 1756 - - - 75 18 100 % -  2018/06/04 1755 - - - 73 - 100 % -  Jun 04, 2018 1754 - - - 74 20 100 % -  06/04/2018 1753 - - - 72 17 100 % -  06-04-2018 1752 - - - 74 18 100 % -  06/04/2018 1751 - - - 73 (!) 22 100 % -  Jun 04, 2018 1750 - - - 75 18 100 % -  2018-06-04 1749 - - - 71 20 100 % -  Jun 04, 2018 1748 - - - 72 20 100 % -  Jun 04, 2018 1747 - - - 72 17 100 % -   06/04/2018 1746 - - - 74 (!) 22 100 % -  Jun 04, 2018 1745 - - - 71 20 100 % -  2018/06/04 1744 - - - 70 20 100 % -  06/04/18 1743 - - - 70 18 100 % -  04-Jun-2018 1742 - - - 71 19 100 % -  06/04/2018 1741 - - - 72 17 99 % -  Jun 04, 2018 1740 - - - 71 18 99 % -  06-04-18 1739 - - - 74 (!) 21 100 % -  Jun 04, 2018 1738 - - - 70 19 100 % -  Jun 04, 2018 1737 - - - 71 15 99 % -  06/04/2018 1736 - - - 71 13 100 % -  04-Jun-2018 1735 - - - 70 18 98 % -  Jun 04, 2018 1734 - - - 72 (!) 25 100 % -  06-04-2018 1733 - - - 78 (!) 24 100 % -  06/04/18 1732 - - - 76 19 100 % -  06/04/2018 1731 - - - 79 19 100 % -  06/04/18 1730 (!) 236/106 - - 82 (!) 23 98 % -  Jun 04, 2018 1729 - - - 85 17 100 % -  06/04/2018 1728 - - - 84 19 100 % -  Jun 04, 2018 1727 - - - 85 20 100 % -  04-Jun-2018 1726 - - - 84 16 99 % -  06-04-2018 1725 (!) 249/114 - - 85 18 99 % -  2018-06-04 1600 (!) 248/114 - - 84 19 99 % -  06/04/18 1526 - - - - - - 5\' 5"  (1.651 m)  2018/06/04 1525 (!) 243/102 (!) 100.7 F (38.2 C) Oral 82 16 100 % -  06/04/2018 1520 - - - - - 99 % -    Medical Decision Making: Patient with nonspecific chest abdominal and back pain, presenting with high blood pressure.  Treated for hypertension with single dose of labetalol.  She had sudden loss of pulses, and apnea.  She sustained PEA arrest and was not able to be resuscitated.  Etiology of arrest is not clear.  No evidence for hyperkalemia.  Initial EKG and troponin not  diagnostic of acute MI.  Suspect cardiac arrhythmia, versus occult bleeding.  Case discussed with her PCP, Dr. Buelah Manis, who will sign that the certificate.  CRITICAL CARE-yes Performed by: Daleen Bo  Nursing Notes Reviewed/ Care Coordinated Applicable Imaging Reviewed Interpretation of Laboratory Data incorporated into ED treatment   Patient expired in the emergency department.   Final Clinical Impressions(s) / ED Diagnoses   Final diagnoses:  Cardiac arrest [facility])  Hypertension, unspecified type    ED Discharge Orders     None       Daleen Bo, MD 05-18-2018 [phone]

## 2018-05-12 NOTE — Telephone Encounter (Signed)
[place] vomiting all night and would like to get some zofran for her if possible  [phone]

## 2018-05-12 NOTE — ED Notes (Signed)
Atropine 1mg  and Epi 1mg  given.

## 2018-05-12 NOTE — ED Triage Notes (Signed)
Pt brought in by [facility] from [place] with c/o hypertension. EMS reports BP of 250/115. Nursing staff reported to EMS that this just started this morning and that pt's BP "normally runs high, but not that high". Pt reports she has been vomiting all night and started having mid chest pain today. Denies cough, SOB. Temp in triage 100.7 orally.

## 2018-05-12 NOTE — Telephone Encounter (Signed)
Marita Kansas from [facility] called to report that pt's bp is high. RN checked it and it was 256/117 then rechecked it later and it was 216/117. Pt is also have some abd pain and Marita Kansas believes it from vomiting. Please advise.

## 2018-05-12 NOTE — Code Documentation (Signed)
Patient time of death occurred at 67. Called by Dr. Eulis Foster.

## 2018-05-12 NOTE — Telephone Encounter (Signed)
Per [place], pt needs to go to ED. Notified Kristy. Verbalizes understanding.

## 2018-05-12 NOTE — Code Documentation (Signed)
Pulses lost. CPR restarted 

## 2018-05-12 DEATH — deceased

## 2018-05-17 MED FILL — Medication: Qty: 1 | Status: AC

## 2018-06-20 ENCOUNTER — Ambulatory Visit: Payer: Medicare HMO | Admitting: Family Medicine
